# Patient Record
Sex: Female | Born: 1955 | Race: White | Hispanic: No | Smoking: Former smoker
Health system: Southern US, Community
[De-identification: ages and names within clinical notes are randomized; demographics above are authoritative.]

## PROBLEM LIST (undated history)

## (undated) DIAGNOSIS — E669 Obesity, unspecified: Secondary | ICD-10-CM

## (undated) DIAGNOSIS — F419 Anxiety disorder, unspecified: Secondary | ICD-10-CM

## (undated) DIAGNOSIS — M797 Fibromyalgia: Secondary | ICD-10-CM

## (undated) DIAGNOSIS — E785 Hyperlipidemia, unspecified: Secondary | ICD-10-CM

## (undated) DIAGNOSIS — F329 Major depressive disorder, single episode, unspecified: Secondary | ICD-10-CM

## (undated) DIAGNOSIS — K760 Fatty (change of) liver, not elsewhere classified: Secondary | ICD-10-CM

## (undated) DIAGNOSIS — L219 Seborrheic dermatitis, unspecified: Secondary | ICD-10-CM

## (undated) DIAGNOSIS — J45909 Unspecified asthma, uncomplicated: Secondary | ICD-10-CM

## (undated) DIAGNOSIS — E039 Hypothyroidism, unspecified: Secondary | ICD-10-CM

## (undated) DIAGNOSIS — I10 Essential (primary) hypertension: Secondary | ICD-10-CM

## (undated) DIAGNOSIS — T7840XA Allergy, unspecified, initial encounter: Secondary | ICD-10-CM

## (undated) DIAGNOSIS — D649 Anemia, unspecified: Secondary | ICD-10-CM

## (undated) DIAGNOSIS — E119 Type 2 diabetes mellitus without complications: Secondary | ICD-10-CM

## (undated) DIAGNOSIS — F32A Depression, unspecified: Secondary | ICD-10-CM

## (undated) DIAGNOSIS — G4733 Obstructive sleep apnea (adult) (pediatric): Secondary | ICD-10-CM

## (undated) DIAGNOSIS — K810 Acute cholecystitis: Secondary | ICD-10-CM

## (undated) DIAGNOSIS — K219 Gastro-esophageal reflux disease without esophagitis: Secondary | ICD-10-CM

## (undated) DIAGNOSIS — I639 Cerebral infarction, unspecified: Secondary | ICD-10-CM

## (undated) DIAGNOSIS — E1169 Type 2 diabetes mellitus with other specified complication: Secondary | ICD-10-CM

## (undated) HISTORY — DX: Type 2 diabetes mellitus without complications: E11.9

## (undated) HISTORY — DX: Allergy, unspecified, initial encounter: T78.40XA

## (undated) HISTORY — DX: Fatty (change of) liver, not elsewhere classified: K76.0

## (undated) HISTORY — DX: Seborrheic dermatitis, unspecified: L21.9

## (undated) HISTORY — PX: TUBAL LIGATION: SHX77

## (undated) HISTORY — PX: REDUCTION MAMMAPLASTY: SUR839

## (undated) HISTORY — DX: Hyperlipidemia, unspecified: E11.69

## (undated) HISTORY — DX: Anxiety disorder, unspecified: F41.9

## (undated) HISTORY — DX: Anemia, unspecified: D64.9

## (undated) HISTORY — DX: Hyperlipidemia, unspecified: E78.5

## (undated) HISTORY — DX: Acute cholecystitis: K81.0

## (undated) HISTORY — DX: Essential (primary) hypertension: I10

## (undated) HISTORY — DX: Unspecified asthma, uncomplicated: J45.909

## (undated) HISTORY — DX: Major depressive disorder, single episode, unspecified: F32.9

## (undated) HISTORY — DX: Gastro-esophageal reflux disease without esophagitis: K21.9

## (undated) HISTORY — DX: Depression, unspecified: F32.A

## (undated) HISTORY — DX: Obstructive sleep apnea (adult) (pediatric): G47.33

## (undated) HISTORY — DX: Hypothyroidism, unspecified: E03.9

## (undated) HISTORY — DX: Cerebral infarction, unspecified: I63.9

---

## 1999-05-06 ENCOUNTER — Other Ambulatory Visit: Admission: RE | Admit: 1999-05-06 | Discharge: 1999-05-06 | Payer: Self-pay | Admitting: Internal Medicine

## 1999-06-11 ENCOUNTER — Inpatient Hospital Stay (HOSPITAL_COMMUNITY): Admission: AD | Admit: 1999-06-11 | Discharge: 1999-06-17 | Payer: Self-pay | Admitting: Internal Medicine

## 1999-06-12 ENCOUNTER — Encounter: Payer: Self-pay | Admitting: Internal Medicine

## 1999-06-14 ENCOUNTER — Encounter: Payer: Self-pay | Admitting: Internal Medicine

## 2000-03-24 ENCOUNTER — Other Ambulatory Visit: Admission: RE | Admit: 2000-03-24 | Discharge: 2000-03-24 | Payer: Self-pay | Admitting: Obstetrics and Gynecology

## 2001-06-21 ENCOUNTER — Other Ambulatory Visit: Admission: RE | Admit: 2001-06-21 | Discharge: 2001-06-21 | Payer: Self-pay | Admitting: Internal Medicine

## 2001-11-03 ENCOUNTER — Encounter: Payer: Self-pay | Admitting: Emergency Medicine

## 2001-11-03 ENCOUNTER — Inpatient Hospital Stay (HOSPITAL_COMMUNITY): Admission: EM | Admit: 2001-11-03 | Discharge: 2001-11-09 | Payer: Self-pay | Admitting: Emergency Medicine

## 2001-11-03 ENCOUNTER — Encounter: Payer: Self-pay | Admitting: Neurology

## 2001-11-06 ENCOUNTER — Encounter: Payer: Self-pay | Admitting: Neurology

## 2001-11-09 ENCOUNTER — Encounter: Payer: Self-pay | Admitting: Neurology

## 2001-11-15 ENCOUNTER — Encounter: Admission: RE | Admit: 2001-11-15 | Discharge: 2001-12-11 | Payer: Self-pay | Admitting: Neurology

## 2002-10-09 ENCOUNTER — Other Ambulatory Visit: Admission: RE | Admit: 2002-10-09 | Discharge: 2002-10-09 | Payer: Self-pay | Admitting: Obstetrics and Gynecology

## 2002-11-01 ENCOUNTER — Ambulatory Visit (HOSPITAL_COMMUNITY): Admission: RE | Admit: 2002-11-01 | Discharge: 2002-11-01 | Payer: Self-pay | Admitting: Obstetrics and Gynecology

## 2002-11-23 ENCOUNTER — Ambulatory Visit (HOSPITAL_COMMUNITY): Admission: RE | Admit: 2002-11-23 | Discharge: 2002-11-23 | Payer: Self-pay | Admitting: Obstetrics and Gynecology

## 2003-10-04 ENCOUNTER — Inpatient Hospital Stay (HOSPITAL_COMMUNITY): Admission: EM | Admit: 2003-10-04 | Discharge: 2003-10-10 | Payer: Self-pay | Admitting: Psychiatry

## 2003-10-06 ENCOUNTER — Encounter: Payer: Self-pay | Admitting: *Deleted

## 2008-06-27 HISTORY — PX: BREAST REDUCTION SURGERY: SHX8

## 2008-07-10 ENCOUNTER — Encounter: Admission: RE | Admit: 2008-07-10 | Discharge: 2008-07-10 | Payer: Self-pay | Admitting: Internal Medicine

## 2008-07-15 ENCOUNTER — Encounter: Admission: RE | Admit: 2008-07-15 | Discharge: 2008-07-15 | Payer: Self-pay | Admitting: Internal Medicine

## 2009-06-10 ENCOUNTER — Encounter: Admission: RE | Admit: 2009-06-10 | Discharge: 2009-06-10 | Payer: Self-pay | Admitting: Internal Medicine

## 2009-08-15 ENCOUNTER — Other Ambulatory Visit: Admission: RE | Admit: 2009-08-15 | Discharge: 2009-08-15 | Payer: Self-pay | Admitting: Internal Medicine

## 2010-10-18 ENCOUNTER — Encounter: Payer: Self-pay | Admitting: Internal Medicine

## 2011-01-07 ENCOUNTER — Other Ambulatory Visit: Payer: Self-pay | Admitting: Internal Medicine

## 2011-01-07 ENCOUNTER — Other Ambulatory Visit (HOSPITAL_COMMUNITY)
Admission: RE | Admit: 2011-01-07 | Discharge: 2011-01-07 | Disposition: A | Discharge status: Home / Self Care | Payer: 59 | Source: Ambulatory Visit | Attending: Internal Medicine | Admitting: Internal Medicine

## 2011-01-07 DIAGNOSIS — Z1159 Encounter for screening for other viral diseases: Secondary | ICD-10-CM | POA: Insufficient documentation

## 2011-01-07 DIAGNOSIS — Z01419 Encounter for gynecological examination (general) (routine) without abnormal findings: Secondary | ICD-10-CM | POA: Insufficient documentation

## 2011-02-12 NOTE — Discharge Summary (Signed)
[place]. [facility]  Patient:    Jeanette Rose, Jeanette Rose Visit Number: [id number] MRN: [id number]          Service Type: MED Location: [phone] Attending Physician:  Meredith Leeds Dictated by:   Alyson Locket. Love, M.D. Admit Date:  11/03/2001 Disc. Date: 11/09/01   CC:         Charlyne Quale, M.D.   Discharge Summary  PATIENTS ADDRESS:  [address] [place]:  05/30/2056  This is the first [facility] admission for this 55 year old left-handed white divorced female from [place], [place] admitted from the emergency room for evaluation of dizziness, a light-headed sensation, double vision, and numbness of the tongue.  HISTORY OF PRESENT ILLNESS:  Mrs. Sibert has no prior history of high blood pressure, diabetes, or heart disease.  She has a history of some migraine headaches in her life.  She has been on birth control pills for three years and is a smoker, smoking one pack per day of cigarettes.  On the day of admission at about noon-time, she turned her head to her right quickly and noted the onset of a light-headed sensation and later a numb tongue.  Symptoms were associated with double vision.  Later, the double vision recurred when she was driving her car.  This was similar to the first episode but did not resolve and she came to the emergency room.  She reports that once previously many years, she had been light-headed.  She has no history of ______  sign, previous double vision, swallowing problems, slurred speech, blackout spells, or seizures.  PAST MEDICAL HISTORY:  Significant for birth control pills for three years, alcohol once per month, no allergies, and cigarette smoking of one pack per day, and possibly gastroesophageal reflux disease requiring intermittent Prevacid therapy.  Other details of her admission history have been dictated elsewhere.  PHYSICAL EXAMINATION:  VITAL  SIGNS:  Her examination at the time of admission revealed a blood pressure in the right and left arm of 160/81 and 150/80.  Her heart rate was 60 and regular.  GENERAL:  Unremarkable.  NEUROLOGIC:  Decreased medial movement of her right eye and upbeating vertical nystagmus.  She had slight hyperreflexia and downgoing plantar responses.  LABORATORY DATA:  A CT scan of the brain was unremarkable without the use of contrast enhancement.  MRI of the brain with and without contrast enhancement showed no acute finding, a few tiny foci of high flared T2 signal within the posterior end of the internal capsule on the right of uncertain significance. Intracranial MRA was negative.  Her chest x-ray showed no active cardiopulmonary process demonstrated.  Cervical MRI without contrast and MR angiography of the neck showed a small central disk herniation and spondylosis at C5-6, small left-sided disk herniation at C6-7.  MR angiography of the neck without contrast was negative without any evidence of a dissection present.  EKG showed sinus bradycardia with minimal voltage criteria for LVH and was considered a normal variant.  Two-D echocardiogram of the heart showed overall left ventricular systolic function and was normal.  Left ventricular ejection fraction was estimated between 55 and 65 percent.  There were no diagnostic abnormalities of left ventricular wall motion or valvular disease present.  Her serum for pseudocholin receptor antibody study was negative.  Serum VDRL nonreactive.  ANA study negative.  Her serum TSH was 1.288.  Cholesterol was high at 250, triglycerides 164, HDL 37,  and LDL of 180.  White blood cell count was 10,400, hemoglobin 14.1, hematocrit 41.8, platelet count 276K, 75 percent polys, 19 percent lymphs, 4 percent monocytes, 2 percent eosinophils, and 1 percent basophil.  PT 12.9, INR 1.0, PTT 24.  Sodium 136, potassium 3.6, chloride 106, CO2 content 23, glucose 89, BUN  14, creatinine 0.9, calcium 9.1.  Total protein 6.9.  Albumin 3.6, AST 19, ALT 15, ALP 72, total bilirubin 0.4.  Her pH 7.406, pCO2 34.5, pO2 23.  Urinalysis x 2 showed small bilirubin, large hemoglobin, small hemoglobin, positive nitrite, many bacteria, 0-2 white blood cells, 3-6 red blood cells. Her urine culture grew E. coli which was sensitive to Septra which she was begun on in the hospital.  CSF evaluation revealed no red blood cells, one white blood cell, slightly elevated opening pressure.  CSF VDRL nonreactive, CSF IgG 3.4, IgG index and oligoclonal banding pending.  Protein 46, glucose of 53.  Visual evoked responses and brain stem auditory evoked responses normal.  In the hospital, a Tensilon test unremarkable.  HOSPITAL COURSE:  The patient was admitted with question of vascular disease versus demyelinating disease as the cause of symptomatology.  Telemetry showed no significant abnormalities.  She was placed on aspirin and subcutaneous heparin therapy.  In the hospital, she complained of dysuria and was found to have evidence of urinary tract infection.  She also complained of some flank pain and an ultrasound of the kidneys is pending.  Doppler study of the carotids revealed no ICA stenosis bilaterally and antegrade vertebral flow. Pending studies at the time of discharge include protein C, protein S, antithrombin 3, and anticardiolipin antibodies.  She was taken off of her birth control pills in the hospital, begun on Ecotrin and subcutaneous heparin, and cigarettes were discontinued.  She was begun on a Nicoderm patch.  IMPRESSION: 1. 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 2. 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 3. Urinary tract infection (599.5). 4. Flank pain, rule out polynephritis. 5. Chronic obstructive pulmonary disease (496).  PLAN: 1. Discharge the patient on aspirin 1 q.d., patch alternating with either eye. 2. Discontinue cigarettes. 3.  Discontinue birth control pills.  4. Nicoderm patch 21 mg q.d. x 3 weeks, then 14 mg q.d. x 3 weeks, then    7 mg q.d. 5. Septra DS 1 p.o. b.i.d. x 10 days. 6. Prevacid 30 mg p.o. q.d. 7. Zocor 20 mg q.d. 8. She was to return to see me at 4 p.m. on 11/13/01. Dictated by:   Alyson Locket. Love, M.D. Attending Physician:  Meredith Leeds DD:  11/09/01 TD:  11/09/01 Job: [id number]

## 2011-02-12 NOTE — H&P (Signed)
[place]. [facility]  Patient:    Jeanette Rose, Jeanette Rose Visit Number: [id number] MRN: [id number]          Service Type: MED Location: [id number] Attending Physician:  Meredith Leeds Dictated by:   Alyson Locket. Love, M.D. Admit Date:  11/03/2001                           History and Physical  DATE OF BIRTH:  1956-09-16  [address] ADDRESS: [place], [place]  [place]  REASON FOR ADMISSION:  This is the first [facility] admission for this 55 year old left-handed white divorced female from [place], [place], admitted from the emergency room for evaluation of dizziness characterized by light-headed sensation, double vision and a numb tongue.  HISTORY OF PRESENT ILLNESS:  This patient has no known history of high blood pressure, diabetes or heart disease.  She has had a few migraine headaches in her life and a positive family history of headaches, in that her mother and sister have had migraine headaches.  She has been on birth control pills for three years and smokes approximately one pack per day of cigarettes.  She recalls an episode several years ago in which she stood up quickly and became somewhat dizzy.  This did last for a prolonged period of time.  Today, about noontime, she turned her head to the right quickly and noted the onset of a light-headed sensation later associated with a numb tongue and later associated with double vision.  She noticed while walking that she was staggering to her right.  Her symptoms cleared within 15 to 20 minutes and later while driving a car, she noted again the onset of double vision.  The double vision did not resolve and she was brought to the emergency room. There was no loss of consciousness, headache, chest pain or palpitations.  She did have associated nausea and vomiting.  She has not been on aspirin.  She denies any use of drugs.  PAST MEDICAL HISTORY:   Her past medical history is significant for birth control pills for three years, alcohol approximately once per month, no allergies and cigarettes one pack per day.  FAMILY HISTORY:  Her mother is 63, living and well; father died at 84 from myocardial infarction; she has a brother 77, another brother 32, a sister 65. She has children, a daughter 73 and a son 82, living and well.   PHYSICAL EXAMINATION:  GENERAL:  Physical examination revealed a well-developed white female who is in acute distress.  VITAL SIGNS:  Her blood pressure in right arm was 160/80, left arm 150/80. Heart rate was 60.  There were no bruits.  She was afebrile.  NEUROLOGIC:  Mental status:  She was alert and oriented x3.  Her cranial nerve examination revealed visual fields were full.  The disks were flat.  She had decreased medial movement of her right eye.  There was no ptosis.  There was no pupillary asymmetry.  The pupils were both reactive.  She had upbeating nystagmus.  Her corneals were present.  The pinprick was equal in the face. The tongue was midline.  The uvula was midline.  Gags were present.  Hearing was intact with air conduction greater than bone conduction.  Motor examination revealed 5/5 strength with increased deep tendon reflexes in the upper and lower extremities, including the superficial abdominal and the jaw jerks.  Sensory  examination was intact to pinprick, touch, joint position and vibration.  Deep tendon reflexes were as above.  HEENT:  General examination revealed the tympanic membranes to be clear.  LUNGS:  Clear.  HEART:  No murmurs.  ABDOMEN:  Bowel sounds were normal.  There was no enlargement of the liver, spleen or kidneys.  EXTREMITIES:  There was no clubbing or edema in extremities.  LABORATORY AND ACCESSORY DATA:  CT scan was negative.  Hemoglobin 15, hematocrit 44, sodium 137, potassium 3.6, chloride 108, CO2 content 23, BUN 13, glucose 96.  IMPRESSION: 1.  Double vision, code 368.2. 2. Vertigo, code 780.4, suspect brain stem stroke, rule out vertebral    dissection versus stroke secondary to birth control pills and cigarettes.  PLAN:  Patient will be admitted for aspirin therapy and further evaluation. Dictated by:   Alyson Locket. Love, M.D. Attending Physician:  Meredith Leeds DD:  11/03/01 TD:  11/04/01 Job: [phone]

## 2011-02-12 NOTE — Op Note (Signed)
NAME:  Jeanette Rose, Jeanette Rose                          ACCOUNT NO.:  [id number]   MEDICAL RECORD NO.:  [id number]                   PATIENT TYPE:  AMB   LOCATION:  SDC                                  FACILITY:  [facility]   PHYSICIAN:  Freda Munro, M.D.                 DATE OF BIRTH:  09-Jan-1956   DATE OF PROCEDURE:  11/23/2002  DATE OF DISCHARGE:                                 OPERATIVE REPORT   PREOPERATIVE DIAGNOSIS:  The patient desires permanent sterilization.   POSTOPERATIVE DIAGNOSIS:  The patient desires permanent sterilization with  extensive adhesions in the right lower quadrant and right adnexa.   PROCEDURES:  1. Laparoscopy with lysis of adhesions.  2. Bilateral tubal ligation.   SURGEON:  Freda Munro, M.D.   ANESTHESIA:  General endotracheal anesthesia.   ANTIBIOTICS:  Ancef 1 g.   ESTIMATED BLOOD LOSS:  Minimal.   COMPLICATIONS:  None.   SPECIMENS:  None.   INDICATIONS FOR PROCEDURE:  The patient is a 55 year old white female who  expressed her desire for permanent sterilization.  Prior to having the  procedure done, the patient expressed her understanding of the intended  permanence, possible failure rates and the risk and complications of  laparoscopy.   FINDINGS:  The patient had normal-appearing liver.  The gallbladder was not  seen.  The appendix was not visualized.  The patient had extensive adhesions  between the bowel and the right lower quadrant of the abdomen and also the  right adnexa to the pelvic sidewall involving the sigmoid colon.   DESCRIPTION OF PROCEDURE:  The patient was taken to the operating room where  general anesthetic was administered without complications.  She was then  placed in dorsal lithotomy position and prepped with Hibiclens.  Her bladder  was drained with a red rubber catheter.  A Hulka tenaculum was applied to  the anterior cervical lip.  She was then draped in the usual fashion for  this procedure.  Marcaine 0.25% was used  in the umbilicus.  A vertical skin  incision was then made.  A Veress needle was placed in the peritoneal cavity  and the patient was noted to have a high pressure, therefore, closed  laparoscopy was discontinued and open laparoscopy was performed.  The fascia  was grasped with two hemostats and incised and the parietal peritoneum  entered bluntly.  The O Vicryl sutures were placed on either side of the  incision and the Hasson cannula placed into the abdominal cavity.  Three  liters of carbon dioxide was insufflated.  The patient was then placed in  Trendelenburg.  The patient was noted to have these adhesions as mentioned  above.  The Hulka clip applicator was set up and clip applied to each  fallopian tube in the isthmic portion.  The tube appeared to be completely  with the clasp.  The clasp was applied perpendicular to the  tube and the  clasp appeared to be tightly closed.  Once this was accomplished, hook  scissors were used to take down the adhesions involving the bowel and right  lower quadrant.  Once this was accomplished, the ovary was grasped and the  sigmoid colon dissected from the ovary.  The sigmoid colon was then free.  Following this, the ovary was found to be adherent to the pelvic sidewall.  It was dissected from the pelvic sidewall.  Both ovaries appeared to be  normal at the completion of this procedure.  This concluded the procedure.  At this point, the pneumoperitoneum was released.  The fascia was closed  with interrupted O Vicryl suture.  The skin was closed with 4-0 Vicryl  suture.  The patient did have a 5 mm Jeanette Rose placed in the suprapubic region  prior to lysis of adhesions for greater traction on the adhesions.                                               Freda Munro, M.D.    MA/MEDQ  D:  11/23/2002  T:  11/23/2002  Job:  [id number]

## 2011-02-12 NOTE — Procedures (Signed)
[place]. [facility]  Patient:    Jeanette Rose, Jeanette Rose Visit Number: [id number] MRN: [id number]          Service Type: MED Location: [phone] Attending Physician:  Meredith Leeds Dictated by:   Alyson Locket. Love, M.D. Proc. Date: 11/03/01 Admit Date:  11/03/2001                             Procedure Report  ADDRESS:  [address], [place], [place]. DATE OF BIRTH:  06-08-1956.  DESCRIPTION OF PROCEDURE:  The patient was prepped and draped in the left lateral decubitus position.  The L4-5 interspace was entered without difficulty.  Opening pressure with patient tense was 220 mmH2O.  Clear, colorless CSF was obtained and sent for VDRL, cell count, differential, protein, glucose, IgG, and oligoclonal IgG.  The patient tolerated the procedure well. Dictated by:   Alyson Locket. Love, M.D. Attending Physician:  Meredith Leeds DD:  11/03/01 TD:  11/06/01 Job: [id number]

## 2011-02-12 NOTE — Discharge Summary (Signed)
NAME:  Jeanette Rose, Jeanette Rose                          ACCOUNT NO.:  [id number]   MEDICAL RECORD NO.:  [id number]                   PATIENT TYPE:  IPS   LOCATION:  0302                                 FACILITY:  [facility]   PHYSICIAN:  Rulon Eisenmenger, M.D.              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  She described mood changes since she stopped smoking in December.  She  reported promiscuous activity after divorcing husband three years ago.  She  was started on Serzone and Zyprexa by her primary care physician.  The  patient had increased mood lability and thoughts of overdosing or stabbing  herself.   MEDICATIONS:  1. Prevacid.  2. Allegra.  3. Serzone.  4. Zyprexa.   DRUG ALLERGIES:  No known drug allergies.   PHYSICAL EXAMINATION:  GENERAL:  Within normal limits.  NEUROLOGIC:  Nonfocal.   LABORATORY DATA:  Routine admission labs:  Within normal limits.   MENTAL STATUS EXAM:  Fully alert, guarded, initially tearful.  Speech:  Within normal limits.  Mood: Depressed, helpless, hopeless, anxious.  Thought process: Goal directed.  Positive suicidal ideation without plan at  this time and positive paranoid ideation.  Cognitive: Intact.  Judgment and  insight: Fair to poor.   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   AXIS II:  None.   AXIS III:  1. Rule out neuroleptic-induced extrapyramidal symptoms.  2. History of urinary hesitancy.   AXIS IV:  Moderate problems with primary support group and medical problems.   AXIS V:  26/65   HOSPITAL COURSE:  The patient was admitted, ordered routine p.r.n.  medications, underwent further monitoring, and was encouraged  to participate  in individual, group, and milieu therapy.  CT scan was ordered.  Effexor XR  37.5 mg started, Zyprexa held due to urinary hesitancy and Cogentin was  ordered x 1 dose for possible EPS but not to be continued, and Ativan was  ordered p.r.n.  The patient was initially having difficulty eating and  drinking, agitated, anxious, felt that Seroquel helped calm her down,  appeared to have possible akathisia.  The patient reported mixed symptoms  with no clear history of mania or current depressive symptoms.  Anxiety  symptoms had increased with Effexor and primary complaint was anxiety,  dread, sleeping to escape.  The patient was started on Paxil, Seroquel  optimized, and CT was ordered.  The patient reported gradually feeling  better as she stabilized on medications.  She was sedated at one point.  Medications were adjusted to minimize side effects and she reported  resolution of side  effects and resolution of suicidal ideation, feeling more  calm, mood more stable.   CONDITION ON DISCHARGE:  Condition on discharge was markedly improved.  Mood  was mostly euthymic.  Affect: Bright.  Thought processes: Goal directed.  Thought content: Negative for dangerous ideation or psychotic symptoms.  The  patient was discharged in improved condition after being given medication  education.   DISCHARGE MEDICATIONS:  1. Ambien 10 mg one q.h.s. p.r.n. insomnia.  2. Allegra 180 mg q.a.m.  3. Prevacid 30 mg q.a.m.  4. Seroquel 25 mg two to three q.h.s.  5. Ativan 0.5 mg one half q.a.m., one half at 3 p.m., one q.h.s.  6. Paxil CR 25 mg q.a.m.  7. Trileptal 300 mg q.h.s.   FOLLOW UP:  The patient was to follow up with mental health intensive  outpatient starting Monday on January 17 at 8:30 at [facility], also follow  up with [facility] and [facility], and Harriette Bouillon on January 24  at 1:45 p.m.   DISCHARGE DIAGNOSES:   AXIS I:  Major depressive disorder, recurrent, severe  with possible  psychotic features.   AXIS II:  None.   AXIS III:  1. Rule out neuroleptic-induced extrapyramidal symptoms.  2. History of urinary hesitancy.   AXIS IV:  Moderate problems with primary support group and medical problems.   AXIS V:  Global assessment of functioning on discharge was 23.                                               Rulon Eisenmenger, M.D.    JEM/MEDQ  D:  11/18/2003  T:  11/18/2003  Job:  [id number]

## 2011-07-09 ENCOUNTER — Other Ambulatory Visit: Payer: Self-pay | Admitting: Internal Medicine

## 2011-07-09 DIAGNOSIS — Z1231 Encounter for screening mammogram for malignant neoplasm of breast: Secondary | ICD-10-CM

## 2011-07-26 ENCOUNTER — Ambulatory Visit: Payer: 59

## 2011-10-12 ENCOUNTER — Other Ambulatory Visit: Payer: Self-pay | Admitting: Internal Medicine

## 2011-10-12 DIAGNOSIS — R42 Dizziness and giddiness: Secondary | ICD-10-CM

## 2011-10-13 ENCOUNTER — Ambulatory Visit
Admission: RE | Admit: 2011-10-13 | Discharge: 2011-10-13 | Disposition: A | Discharge status: Home / Self Care | Payer: 59 | Source: Ambulatory Visit | Attending: Internal Medicine | Admitting: Internal Medicine

## 2011-10-13 DIAGNOSIS — R42 Dizziness and giddiness: Secondary | ICD-10-CM

## 2013-04-12 ENCOUNTER — Other Ambulatory Visit (HOSPITAL_COMMUNITY)
Admission: RE | Admit: 2013-04-12 | Discharge: 2013-04-12 | Disposition: A | Discharge status: Home / Self Care | Payer: 59 | Source: Ambulatory Visit | Attending: Internal Medicine | Admitting: Internal Medicine

## 2013-04-12 ENCOUNTER — Other Ambulatory Visit: Payer: Self-pay | Admitting: Internal Medicine

## 2013-04-12 DIAGNOSIS — Z01419 Encounter for gynecological examination (general) (routine) without abnormal findings: Secondary | ICD-10-CM | POA: Insufficient documentation

## 2013-09-27 DIAGNOSIS — E039 Hypothyroidism, unspecified: Secondary | ICD-10-CM

## 2013-09-27 HISTORY — DX: Hypothyroidism, unspecified: E03.9

## 2013-10-28 ENCOUNTER — Ambulatory Visit (INDEPENDENT_AMBULATORY_CARE_PROVIDER_SITE_OTHER): Payer: 59 | Admitting: Family Medicine

## 2013-10-28 ENCOUNTER — Ambulatory Visit: Payer: 59

## 2013-10-28 VITALS — BP 118/70 | HR 83 | Temp 99.4°F | Resp 18 | Ht 64.0 in | Wt 187.0 lb

## 2013-10-28 DIAGNOSIS — R05 Cough: Secondary | ICD-10-CM

## 2013-10-28 DIAGNOSIS — R059 Cough, unspecified: Secondary | ICD-10-CM

## 2013-10-28 DIAGNOSIS — R509 Fever, unspecified: Secondary | ICD-10-CM

## 2013-10-28 LAB — POCT CBC
Granulocyte percent: 60.1 %G (ref 37–80)
HCT, POC: 42.3 % (ref 37.7–47.9)
Hemoglobin: 12.9 g/dL (ref 12.2–16.2)
Lymph, poc: 1.8 (ref 0.6–3.4)
MCH, POC: 29.9 pg (ref 27–31.2)
MCHC: 30.5 g/dL — AB (ref 31.8–35.4)
MCV: 97.9 fL — AB (ref 80–97)
MID (cbc): 0.5 (ref 0–0.9)
MPV: 9 fL (ref 0–99.8)
POC Granulocyte: 3.4 (ref 2–6.9)
POC LYMPH PERCENT: 31.8 %L (ref 10–50)
POC MID %: 8.1 %M (ref 0–12)
Platelet Count, POC: 204 10*3/uL (ref 142–424)
RBC: 4.32 M/uL (ref 4.04–5.48)
RDW, POC: 14.4 %
WBC: 5.7 10*3/uL (ref 4.6–10.2)

## 2013-10-28 LAB — POCT INFLUENZA A/B
Influenza A, POC: NEGATIVE
Influenza B, POC: NEGATIVE

## 2013-10-28 MED ORDER — HYDROCODONE-HOMATROPINE 5-1.5 MG/5ML PO SYRP: 5.0000 mL | ORAL_SOLUTION | Freq: Three times a day (TID) | ORAL | Status: DC | PRN

## 2013-10-28 MED ORDER — AZITHROMYCIN 250 MG PO TABS: ORAL_TABLET | ORAL | Status: DC

## 2013-10-28 NOTE — Progress Notes (Signed)
Subjective:    Patient ID: Jeanette Rose, female    DOB: 1956/02/08, 58 y.o.   MRN: [id number]  HPI This chart was scribed for [name] by Celesta Gentile, Scribe. This patient was seen in room 13 and the patient's care was started at 12:42 PM.  HPI Comments: Jeanette Rose is a 58 y.o. female who presents to the [facility] complaining of intermittent SOB with associated persistent cough that started about three days ago.  Pt states that the SOB is worsened with movement.  She has associated eye discomfort and subjective fever.  Temperature is currently 99.16F.  Pt also complains of moderate sharp back pain and HA.  She reports that she received a flu shot this year.  Pt denies h/o of asthma.  Pt does not smoke.  Pt works at [employer] here in [place].   History reviewed. No pertinent past surgical history.  Family History  Problem Relation Age of Onset   Heart disease Father     History   Social History   Marital Status: Divorced    Spouse Name: N/A    Number of Children: N/A   Years of Education: N/A   Occupational History   Not on file.   Social History Main Topics   Smoking status: Never Smoker    Smokeless tobacco: Not on file   Alcohol Use: Not on file   Drug Use: Not on file   Sexual Activity: Not on file   Other Topics Concern   Not on file   Social History Narrative   No narrative on file    No Known Allergies  There are no active problems to display for this patient.   Review of Systems  Constitutional: Positive for fever. Negative for chills.  HENT: Negative for congestion, ear pain, rhinorrhea and sore throat.   Eyes: Positive for pain.  Respiratory: Positive for cough and shortness of breath.   Cardiovascular: Negative for chest pain.  Gastrointestinal: Negative for nausea, vomiting, abdominal pain and diarrhea.  Musculoskeletal: Positive for back pain.  Skin: Negative for color change and rash.    Neurological: Positive for headaches. Negative for syncope.      Objective:   Physical Exam Patient's no acute distress but appears somewhat ill and tired HEENT: Mild erythema of the throat otherwise negative Neck: Supple, no adenopathy, Chest: Rhonchi bilaterally Heart: Regular, no murmur Skin: No obvious lesions or rashes [facility] reading (PRIMARY) by  Dr. Joseph Art:  CXR: no infiltrates Some increased breath sounds after nebulizer Results for orders placed in visit on 10/28/13  POCT CBC      Result Value Range   WBC 5.7  4.6 - 10.2 K/uL   Lymph, poc 1.8  0.6 - 3.4   POC LYMPH PERCENT 31.8  10 - 50 %L   MID (cbc) 0.5  0 - 0.9   POC MID % 8.1  0 - 12 %M   POC Granulocyte 3.4  2 - 6.9   Granulocyte percent 60.1  37 - 80 %G   RBC 4.32  4.04 - 5.48 M/uL   Hemoglobin 12.9  12.2 - 16.2 g/dL   HCT, POC 42.3  37.7 - 47.9 %   MCV 97.9 (*) 80 - 97 fL   MCH, POC 29.9  27 - 31.2 pg   MCHC 30.5 (*) 31.8 - 35.4 g/dL   RDW, POC 14.4     Platelet Count, POC 204  142 - 424 K/uL  MPV 9.0  0 - 99.8 fL  POCT INFLUENZA A/B      Result Value Range   Influenza A, POC Negative     Influenza B, POC Negative     .   Triage Vitals: BP 118/70   Pulse 83   Temp(Src) 99.4 F (37.4 C)   Resp 18   Ht 5' 4"  (1.626 m)   Wt 187 lb (84.823 kg)   BMI 32.08 kg/m2   SpO2 96%     Assessment & Plan:    Cough - Plan: POCT CBC, POCT Influenza A/B, DG Chest 2 View, azithromycin (ZITHROMAX Z-PAK) 250 MG tablet, HYDROcodone-homatropine (HYCODAN) 5-1.5 MG/5ML syrup  Fever, unspecified - Plan: POCT CBC, POCT Influenza A/B, DG Chest 2 View, azithromycin (ZITHROMAX Z-PAK) 250 MG tablet, HYDROcodone-homatropine (HYCODAN) 5-1.5 MG/5ML syrup  Signed, Robyn Haber, MD

## 2013-10-28 NOTE — Patient Instructions (Signed)

## 2013-10-30 ENCOUNTER — Telehealth: Payer: Self-pay

## 2013-10-30 NOTE — Telephone Encounter (Signed)
Okay to extend note until Monday the 9th of feb 2015

## 2013-10-30 NOTE — Telephone Encounter (Signed)
Please advise as it doesn't state her out of work status on the encounter. Pt states her back still hurts, having sinus pressure and pain along with weakness. No fever this morning. Would like her work note extended through the rest of the week .

## 2013-10-30 NOTE — Telephone Encounter (Signed)
Patient called back to see if her work note was ready for pick up

## 2013-10-30 NOTE — Telephone Encounter (Signed)
Patient states that she was seen for bronchitis and was given a note to return to work on 10/31/2012 however she does not think she is well enough to return by then. Can she get an extension for the rest of the week?  [phone]

## 2013-10-31 ENCOUNTER — Encounter: Payer: Self-pay | Admitting: *Deleted

## 2013-10-31 NOTE — Telephone Encounter (Signed)
Work note printed, put upfront for pt to print out. Pt notified.

## 2014-04-01 ENCOUNTER — Other Ambulatory Visit (HOSPITAL_COMMUNITY): Payer: Self-pay | Admitting: Internal Medicine

## 2014-04-01 ENCOUNTER — Ambulatory Visit (HOSPITAL_COMMUNITY)
Admission: RE | Admit: 2014-04-01 | Discharge: 2014-04-01 | Disposition: A | Discharge status: Home / Self Care | Payer: 59 | Source: Ambulatory Visit | Attending: Internal Medicine | Admitting: Internal Medicine

## 2014-04-01 ENCOUNTER — Other Ambulatory Visit: Payer: Self-pay | Admitting: Internal Medicine

## 2014-04-01 DIAGNOSIS — R160 Hepatomegaly, not elsewhere classified: Secondary | ICD-10-CM

## 2014-04-01 DIAGNOSIS — R10811 Right upper quadrant abdominal tenderness: Secondary | ICD-10-CM

## 2014-04-02 ENCOUNTER — Encounter (HOSPITAL_COMMUNITY): Payer: Self-pay | Admitting: Emergency Medicine

## 2014-04-02 ENCOUNTER — Encounter (INDEPENDENT_AMBULATORY_CARE_PROVIDER_SITE_OTHER): Payer: Self-pay | Admitting: General Surgery

## 2014-04-02 ENCOUNTER — Emergency Department (HOSPITAL_COMMUNITY): Payer: 59

## 2014-04-02 ENCOUNTER — Inpatient Hospital Stay (HOSPITAL_COMMUNITY)
Admission: EM | Admit: 2014-04-02 | Discharge: 2014-04-07 | DRG: 419 | Disposition: A | Discharge status: Home / Self Care | Payer: 59 | Attending: Surgery | Admitting: Surgery

## 2014-04-02 DIAGNOSIS — K7581 Nonalcoholic steatohepatitis (NASH): Secondary | ICD-10-CM | POA: Diagnosis present

## 2014-04-02 DIAGNOSIS — E119 Type 2 diabetes mellitus without complications: Secondary | ICD-10-CM | POA: Diagnosis present

## 2014-04-02 DIAGNOSIS — N9989 Other postprocedural complications and disorders of genitourinary system: Secondary | ICD-10-CM | POA: Diagnosis not present

## 2014-04-02 DIAGNOSIS — R112 Nausea with vomiting, unspecified: Secondary | ICD-10-CM

## 2014-04-02 DIAGNOSIS — K810 Acute cholecystitis: Secondary | ICD-10-CM

## 2014-04-02 DIAGNOSIS — IMO0002 Reserved for concepts with insufficient information to code with codable children: Secondary | ICD-10-CM | POA: Diagnosis not present

## 2014-04-02 DIAGNOSIS — F411 Generalized anxiety disorder: Secondary | ICD-10-CM | POA: Diagnosis present

## 2014-04-02 DIAGNOSIS — R160 Hepatomegaly, not elsewhere classified: Secondary | ICD-10-CM | POA: Diagnosis present

## 2014-04-02 DIAGNOSIS — K449 Diaphragmatic hernia without obstruction or gangrene: Secondary | ICD-10-CM | POA: Diagnosis present

## 2014-04-02 DIAGNOSIS — F329 Major depressive disorder, single episode, unspecified: Secondary | ICD-10-CM | POA: Diagnosis present

## 2014-04-02 DIAGNOSIS — Y839 Surgical procedure, unspecified as the cause of abnormal reaction of the patient, or of later complication, without mention of misadventure at the time of the procedure: Secondary | ICD-10-CM | POA: Diagnosis not present

## 2014-04-02 DIAGNOSIS — Z794 Long term (current) use of insulin: Secondary | ICD-10-CM

## 2014-04-02 DIAGNOSIS — R339 Retention of urine, unspecified: Secondary | ICD-10-CM | POA: Diagnosis not present

## 2014-04-02 DIAGNOSIS — I10 Essential (primary) hypertension: Secondary | ICD-10-CM | POA: Diagnosis present

## 2014-04-02 DIAGNOSIS — R1011 Right upper quadrant pain: Secondary | ICD-10-CM

## 2014-04-02 DIAGNOSIS — Z8249 Family history of ischemic heart disease and other diseases of the circulatory system: Secondary | ICD-10-CM

## 2014-04-02 DIAGNOSIS — F3289 Other specified depressive episodes: Secondary | ICD-10-CM | POA: Diagnosis present

## 2014-04-02 DIAGNOSIS — F419 Anxiety disorder, unspecified: Secondary | ICD-10-CM | POA: Diagnosis present

## 2014-04-02 DIAGNOSIS — E1142 Type 2 diabetes mellitus with diabetic polyneuropathy: Secondary | ICD-10-CM | POA: Diagnosis present

## 2014-04-02 DIAGNOSIS — K219 Gastro-esophageal reflux disease without esophagitis: Secondary | ICD-10-CM | POA: Diagnosis present

## 2014-04-02 DIAGNOSIS — Z6832 Body mass index (BMI) 32.0-32.9, adult: Secondary | ICD-10-CM

## 2014-04-02 DIAGNOSIS — K7689 Other specified diseases of liver: Secondary | ICD-10-CM | POA: Diagnosis present

## 2014-04-02 DIAGNOSIS — E669 Obesity, unspecified: Secondary | ICD-10-CM | POA: Diagnosis present

## 2014-04-02 DIAGNOSIS — K660 Peritoneal adhesions (postprocedural) (postinfection): Secondary | ICD-10-CM | POA: Diagnosis present

## 2014-04-02 DIAGNOSIS — K819 Cholecystitis, unspecified: Secondary | ICD-10-CM

## 2014-04-02 HISTORY — DX: Obesity, unspecified: E66.9

## 2014-04-02 HISTORY — DX: Acute cholecystitis: K81.0

## 2014-04-02 LAB — CBC WITH DIFFERENTIAL/PLATELET
Basophils Absolute: 0 10*3/uL (ref 0.0–0.1)
Basophils Relative: 0 % (ref 0–1)
Eosinophils Absolute: 0.1 10*3/uL (ref 0.0–0.7)
Eosinophils Relative: 0 % (ref 0–5)
HCT: 40.1 % (ref 36.0–46.0)
HEMOGLOBIN: 14 g/dL (ref 12.0–15.0)
LYMPHS ABS: 1.4 10*3/uL (ref 0.7–4.0)
Lymphocytes Relative: 8 % — ABNORMAL LOW (ref 12–46)
MCH: 31.7 pg (ref 26.0–34.0)
MCHC: 34.9 g/dL (ref 30.0–36.0)
MCV: 90.9 fL (ref 78.0–100.0)
MONOS PCT: 7 % (ref 3–12)
Monocytes Absolute: 1.2 10*3/uL — ABNORMAL HIGH (ref 0.1–1.0)
NEUTROS ABS: 15.5 10*3/uL — AB (ref 1.7–7.7)
Neutrophils Relative %: 85 % — ABNORMAL HIGH (ref 43–77)
PLATELETS: 224 10*3/uL (ref 150–400)
RBC: 4.41 MIL/uL (ref 3.87–5.11)
RDW: 13.1 % (ref 11.5–15.5)
WBC: 18.3 10*3/uL — ABNORMAL HIGH (ref 4.0–10.5)

## 2014-04-02 LAB — COMPREHENSIVE METABOLIC PANEL
ALK PHOS: 90 U/L (ref 39–117)
ALT: 48 U/L — AB (ref 0–35)
AST: 55 U/L — ABNORMAL HIGH (ref 0–37)
Albumin: 3.7 g/dL (ref 3.5–5.2)
Anion gap: 16 — ABNORMAL HIGH (ref 5–15)
BILIRUBIN TOTAL: 1.7 mg/dL — AB (ref 0.3–1.2)
BUN: 7 mg/dL (ref 6–23)
CO2: 25 meq/L (ref 19–32)
Calcium: 9.9 mg/dL (ref 8.4–10.5)
Chloride: 92 mEq/L — ABNORMAL LOW (ref 96–112)
Creatinine, Ser: 0.67 mg/dL (ref 0.50–1.10)
GLUCOSE: 252 mg/dL — AB (ref 70–99)
POTASSIUM: 4.4 meq/L (ref 3.7–5.3)
Sodium: 133 mEq/L — ABNORMAL LOW (ref 137–147)
TOTAL PROTEIN: 8.1 g/dL (ref 6.0–8.3)

## 2014-04-02 LAB — I-STAT TROPONIN, ED: TROPONIN I, POC: 0 ng/mL (ref 0.00–0.08)

## 2014-04-02 LAB — URINALYSIS, ROUTINE W REFLEX MICROSCOPIC
Bilirubin Urine: NEGATIVE
Glucose, UA: >1000 mg/dL — AB
HGB URINE DIPSTICK: NEGATIVE
KETONES UR: NEGATIVE mg/dL
Leukocytes, UA: NEGATIVE
Nitrite: NEGATIVE
Protein, ur: NEGATIVE mg/dL
SPECIFIC GRAVITY, URINE: 1.014 (ref 1.005–1.030)
Urobilinogen, UA: 2 mg/dL — ABNORMAL HIGH (ref 0.0–1.0)
pH: 6.5 (ref 5.0–8.0)

## 2014-04-02 LAB — PROTIME-INR
INR: 1.06 (ref 0.00–1.49)
Prothrombin Time: 13.8 seconds (ref 11.6–15.2)

## 2014-04-02 LAB — URINE MICROSCOPIC-ADD ON

## 2014-04-02 LAB — GLUCOSE, CAPILLARY
GLUCOSE-CAPILLARY: 167 mg/dL — AB (ref 70–99)
GLUCOSE-CAPILLARY: 185 mg/dL — AB (ref 70–99)
Glucose-Capillary: 176 mg/dL — ABNORMAL HIGH (ref 70–99)

## 2014-04-02 LAB — LIPASE, BLOOD: Lipase: 17 U/L (ref 11–59)

## 2014-04-02 LAB — APTT: APTT: 31 s (ref 24–37)

## 2014-04-02 MED ORDER — MORPHINE SULFATE 4 MG/ML IJ SOLN
4.0000 mg | Freq: Once | INTRAMUSCULAR | Status: AC
  Administered 2014-04-02: 4 mg via INTRAVENOUS
  Filled 2014-04-02: qty 1

## 2014-04-02 MED ORDER — IOHEXOL 300 MG/ML  SOLN
100.0000 mL | Freq: Once | INTRAMUSCULAR | Status: AC | PRN
  Administered 2014-04-02: 100 mL via INTRAVENOUS

## 2014-04-02 MED ORDER — DIPHENHYDRAMINE HCL 50 MG/ML IJ SOLN
12.5000 mg | Freq: Four times a day (QID) | INTRAMUSCULAR | Status: DC | PRN
Start: 2014-04-02 — End: 2014-04-07

## 2014-04-02 MED ORDER — LORAZEPAM 1 MG PO TABS
1.0000 mg | ORAL_TABLET | Freq: Three times a day (TID) | ORAL | Status: DC | PRN
  Administered 2014-04-05 (×2): 1 mg via ORAL
  Filled 2014-04-02 (×2): qty 1

## 2014-04-02 MED ORDER — PROMETHAZINE HCL 25 MG/ML IJ SOLN
6.2500 mg | Freq: Four times a day (QID) | INTRAMUSCULAR | Status: DC | PRN
  Filled 2014-04-02: qty 1

## 2014-04-02 MED ORDER — DULOXETINE HCL 60 MG PO CPEP
120.0000 mg | ORAL_CAPSULE | Freq: Every day | ORAL | Status: DC
  Administered 2014-04-02 – 2014-04-07 (×6): 120 mg via ORAL
  Filled 2014-04-02 (×6): qty 2

## 2014-04-02 MED ORDER — PIPERACILLIN-TAZOBACTAM 3.375 G IVPB
3.3750 g | Freq: Three times a day (TID) | INTRAVENOUS | Status: DC
  Filled 2014-04-02: qty 50

## 2014-04-02 MED ORDER — MENTHOL 3 MG MT LOZG
1.0000 | LOZENGE | OROMUCOSAL | Status: DC | PRN
  Filled 2014-04-02: qty 9

## 2014-04-02 MED ORDER — KETOROLAC TROMETHAMINE 30 MG/ML IJ SOLN
30.0000 mg | Freq: Four times a day (QID) | INTRAMUSCULAR | Status: DC | PRN
  Administered 2014-04-04: 30 mg via INTRAVENOUS
  Filled 2014-04-02 (×2): qty 2
  Filled 2014-04-02: qty 1

## 2014-04-02 MED ORDER — OXYCODONE HCL 5 MG PO TABS
5.0000 mg | ORAL_TABLET | ORAL | Status: DC | PRN
  Administered 2014-04-03: 5 mg via ORAL
  Administered 2014-04-04: 10 mg via ORAL
  Administered 2014-04-04 – 2014-04-05 (×3): 5 mg via ORAL
  Administered 2014-04-05 – 2014-04-06 (×2): 10 mg via ORAL
  Administered 2014-04-06: 5 mg via ORAL
  Administered 2014-04-06 – 2014-04-07 (×3): 10 mg via ORAL
  Filled 2014-04-02: qty 2
  Filled 2014-04-02: qty 1
  Filled 2014-04-02 (×2): qty 2
  Filled 2014-04-02 (×3): qty 1
  Filled 2014-04-02: qty 2
  Filled 2014-04-02 (×2): qty 1
  Filled 2014-04-02 (×2): qty 2

## 2014-04-02 MED ORDER — ONDANSETRON HCL 4 MG/2ML IJ SOLN
4.0000 mg | Freq: Once | INTRAMUSCULAR | Status: AC
  Administered 2014-04-02: 4 mg via INTRAVENOUS
  Filled 2014-04-02: qty 2

## 2014-04-02 MED ORDER — SODIUM CHLORIDE 0.9 % IV BOLUS (SEPSIS)
1000.0000 mL | Freq: Once | INTRAVENOUS | Status: AC
  Administered 2014-04-02: 1000 mL via INTRAVENOUS

## 2014-04-02 MED ORDER — LACTATED RINGERS IV BOLUS (SEPSIS): 1000.0000 mL | Freq: Three times a day (TID) | INTRAVENOUS | Status: DC | PRN

## 2014-04-02 MED ORDER — POTASSIUM CHLORIDE IN NACL 20-0.9 MEQ/L-% IV SOLN
INTRAVENOUS | Status: DC
  Administered 2014-04-02 – 2014-04-03 (×2): via INTRAVENOUS
  Filled 2014-04-02 (×3): qty 1000

## 2014-04-02 MED ORDER — PHENOL 1.4 % MT LIQD
2.0000 | OROMUCOSAL | Status: DC | PRN
Start: 2014-04-02 — End: 2014-04-07

## 2014-04-02 MED ORDER — HEPARIN SODIUM (PORCINE) 5000 UNIT/ML IJ SOLN
5000.0000 [IU] | Freq: Three times a day (TID) | INTRAMUSCULAR | Status: AC
  Administered 2014-04-03: 5000 [IU] via SUBCUTANEOUS
  Filled 2014-04-02: qty 1

## 2014-04-02 MED ORDER — ACETAMINOPHEN 650 MG RE SUPP: 650.0000 mg | Freq: Four times a day (QID) | RECTAL | Status: DC | PRN

## 2014-04-02 MED ORDER — DIPHENHYDRAMINE HCL 12.5 MG/5ML PO ELIX: 12.5000 mg | ORAL_SOLUTION | Freq: Four times a day (QID) | ORAL | Status: DC | PRN

## 2014-04-02 MED ORDER — METOPROLOL TARTRATE 1 MG/ML IV SOLN
5.0000 mg | Freq: Four times a day (QID) | INTRAVENOUS | Status: DC | PRN
  Filled 2014-04-02: qty 5

## 2014-04-02 MED ORDER — ONDANSETRON 4 MG PO TBDP
4.0000 mg | ORAL_TABLET | Freq: Four times a day (QID) | ORAL | Status: DC | PRN
  Filled 2014-04-02: qty 2

## 2014-04-02 MED ORDER — HYDROCHLOROTHIAZIDE 25 MG PO TABS
25.0000 mg | ORAL_TABLET | Freq: Every day | ORAL | Status: DC
  Administered 2014-04-02 – 2014-04-07 (×6): 25 mg via ORAL
  Filled 2014-04-02 (×6): qty 1

## 2014-04-02 MED ORDER — ONDANSETRON HCL 4 MG/2ML IJ SOLN: 4.0000 mg | Freq: Four times a day (QID) | INTRAMUSCULAR | Status: DC | PRN

## 2014-04-02 MED ORDER — SODIUM CHLORIDE 0.9 % IV SOLN
3.0000 g | Freq: Four times a day (QID) | INTRAVENOUS | Status: AC
  Administered 2014-04-02 – 2014-04-06 (×15): 3 g via INTRAVENOUS
  Filled 2014-04-02 (×16): qty 3

## 2014-04-02 MED ORDER — ACETAMINOPHEN 325 MG PO TABS
650.0000 mg | ORAL_TABLET | Freq: Four times a day (QID) | ORAL | Status: DC | PRN
  Administered 2014-04-02 – 2014-04-06 (×2): 650 mg via ORAL
  Filled 2014-04-02 (×2): qty 2

## 2014-04-02 MED ORDER — MORPHINE SULFATE 2 MG/ML IJ SOLN
2.0000 mg | INTRAMUSCULAR | Status: AC | PRN
  Administered 2014-04-02 – 2014-04-03 (×2): 2 mg via INTRAVENOUS
  Filled 2014-04-02 (×2): qty 1

## 2014-04-02 MED ORDER — MAGIC MOUTHWASH
15.0000 mL | Freq: Four times a day (QID) | ORAL | Status: DC | PRN
  Filled 2014-04-02: qty 15

## 2014-04-02 MED ORDER — IOHEXOL 300 MG/ML  SOLN
50.0000 mL | Freq: Once | INTRAMUSCULAR | Status: AC | PRN
  Administered 2014-04-02: 50 mL via ORAL

## 2014-04-02 MED ORDER — FLUTICASONE PROPIONATE 50 MCG/ACT NA SUSP
2.0000 | Freq: Every day | NASAL | Status: DC | PRN
Start: 2014-04-02 — End: 2014-04-07
  Filled 2014-04-02: qty 16

## 2014-04-02 MED ORDER — INSULIN ASPART 100 UNIT/ML ~~LOC~~ SOLN
0.0000 [IU] | Freq: Three times a day (TID) | SUBCUTANEOUS | Status: DC
  Administered 2014-04-02 – 2014-04-03 (×2): 4 [IU] via SUBCUTANEOUS
  Administered 2014-04-03: 3 [IU] via SUBCUTANEOUS
  Administered 2014-04-04 (×2): 11 [IU] via SUBCUTANEOUS
  Administered 2014-04-06: 3 [IU] via SUBCUTANEOUS

## 2014-04-02 MED ORDER — ALUM & MAG HYDROXIDE-SIMETH 200-200-20 MG/5ML PO SUSP
30.0000 mL | Freq: Four times a day (QID) | ORAL | Status: DC | PRN
Start: 2014-04-02 — End: 2014-04-07
  Administered 2014-04-05: 30 mL via ORAL
  Filled 2014-04-02: qty 30

## 2014-04-02 MED ORDER — ZOLPIDEM TARTRATE 5 MG PO TABS: 5.0000 mg | ORAL_TABLET | Freq: Every evening | ORAL | Status: DC | PRN

## 2014-04-02 MED ORDER — PANTOPRAZOLE SODIUM 40 MG PO TBEC
40.0000 mg | DELAYED_RELEASE_TABLET | Freq: Every day | ORAL | Status: DC
  Administered 2014-04-03 – 2014-04-07 (×5): 40 mg via ORAL
  Filled 2014-04-02 (×5): qty 1

## 2014-04-02 MED ORDER — LIP MEDEX EX OINT
1.0000 "application " | TOPICAL_OINTMENT | Freq: Two times a day (BID) | CUTANEOUS | Status: DC
  Administered 2014-04-03 – 2014-04-06 (×8): 1 via TOPICAL
  Filled 2014-04-02 (×2): qty 7

## 2014-04-02 MED ORDER — PANTOPRAZOLE SODIUM 40 MG IV SOLR: 40.0000 mg | Freq: Every day | INTRAVENOUS | Status: DC

## 2014-04-02 NOTE — ED Notes (Signed)
Pt states that she was seen here yesterday for cyst on her liver and was told to come back if she is still having pain.  Pt has n/v but denies diarrhea.  Pt states that she was supposed to be getting an MRI.

## 2014-04-02 NOTE — H&P (Signed)
Jeanette Rose is an 59 y.o. female.   PCP: Kandice Hams, MD  Chief Complaint: Abdominal pain, nausea and vomiting. HPI: Pt reports she was at her baseline state till 03/25/14.  She had a cheese potatoes from [facility].  After that she developed pain RUQ going to her back on the right side, symptoms started about 4 PM.  She developed nausea and vomiting, multiple episodes;  this lasted all night.  She had pain when she went to bed but felt better after getting up.  She did not go to work until 6/30, but felt fine till 03/30/14.  She had another Cheese potatoes from [facility] and her symptoms returned.  She had pain, nausea, multiple episodes of vomiting both 7/4 and on thru 03/31/14.  Nothing made her feel better, there was no position she could take that would make her feel better.  She had ongoing nausea and vomiting.  She saw her PCP on Monday 04/01/14 and was sent for an abdominal US.  This showed: No gallstones or wall thickening visualized, measuring 2 mm. No sonographic Murphy sign noted. Gallbladder is distended. Measuring up to 6 cm in diameter. 4 x 2 x 2.8 cm hypoechoic mass right lobe of the  liver. Liver parenchyma otherwise unremarkable.  Pt has continued to have pain, ongoing nausea and vomiting.  She was unable to eat and was sent home after study.  She continues to have pain, nausea and vomiting so she presented to the ED.  Work up shows mildly elevated LFT's, Bilirubin 1.7, AST, and ALT just minimally elevated.  WBC is 18.3 with left shift.  CT scan shows an inflammatory process in the RUQ surrounding the duodenum and gallbladder. The duodenum is slightly thickened walls and primary duodenal abnormality with secondary gallbladder distension is a consideration although primary gallbladder abnormality not entirely excluded. There is no evidence of extravasation of ingested contrast or free intraperitoneal air. She look and feels miserable, she still had pain, no nausea or vomiting right now.  We are ask  to see.     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 No pertinent past surgical history. None  Family History  Problem Relation Age of Onset  . Heart disease Father    Social History:  reports that she has never smoked. She does not have any smokeless tobacco history on file. She reports that she does not drink alcohol. Her drug history is not on file.  Tobacco 4 years, none for 13 ETOH:  Social none for 10 years Drugs:  None 1 son deceased, divorced, 1 daughter in [place], [place]. Works at the [employer].  Allergies: No Known Allergies  Prior to Admission medications   Medication Sig Start Date End Date Taking? Authorizing Provider  acetaminophen (TYLENOL) 500 MG tablet Take 1,000 mg by mouth every 6 (six) hours as needed for moderate pain.   Yes Historical Provider, MD  cetirizine (ZYRTEC) 10 MG tablet Take 10 mg by mouth daily.   Yes Historical Provider, MD  Cyanocobalamin (VITAMIN B-12 CR PO) Take 1 tablet by mouth daily.   Yes Historical Provider, MD  DULoxetine (CYMBALTA) 60 MG capsule Take 120 mg by mouth daily.  Yes Historical Provider, MD  fluticasone (FLONASE) 50 MCG/ACT nasal spray Place 2 sprays into the nose daily as needed for allergies.    Yes Historical Provider, MD  glyBURIDE-metformin (GLUCOVANCE) 1.25-250 MG per tablet Take 1 tablet by mouth daily with breakfast.   Yes Historical Provider, MD  hydrochlorothiazide (HYDRODIURIL) 25 MG tablet Take 25 mg by mouth daily.   Yes Historical Provider, MD  hydrochlorothiazide (HYDRODIURIL) 25 MG tablet Take 25 mg by mouth daily.   Yes Historical Provider, MD  insulin glargine (LANTUS) 100 UNIT/ML injection Inject 21 Units into the skin daily.    Yes Historical Provider, MD  LORazepam (ATIVAN) 1 MG  tablet Take 1 mg by mouth every 8 (eight) hours as needed for anxiety.    Yes Historical Provider, MD  omeprazole (PRILOSEC) 20 MG capsule Take 20 mg by mouth daily.   Yes Historical Provider, MD  simvastatin (ZOCOR) 10 MG tablet Take 10 mg by mouth daily.   Yes Historical Provider, MD  valsartan (DIOVAN) 80 MG tablet Take 80 mg by mouth daily.   Yes Historical Provider, MD  VITAMIN D, CHOLECALCIFEROL, PO Take 1 capsule by mouth daily.   Yes Historical Provider, MD    Results for orders placed during the hospital encounter of 04/02/14 (from the past 48 hour(s))  CBC WITH DIFFERENTIAL     Status: Abnormal   Collection Time    04/02/14 12:10 PM      Result Value Ref Range   WBC 18.3 (*) 4.0 - 10.5 K/uL   RBC 4.41  3.87 - 5.11 MIL/uL   Hemoglobin 14.0  12.0 - 15.0 g/dL   HCT 40.1  36.0 - 46.0 %   MCV 90.9  78.0 - 100.0 fL   MCH 31.7  26.0 - 34.0 pg   MCHC 34.9  30.0 - 36.0 g/dL   RDW 13.1  11.5 - 15.5 %   Platelets 224  150 - 400 K/uL   Neutrophils Relative % 85 (*) 43 - 77 %   Neutro Abs 15.5 (*) 1.7 - 7.7 K/uL   Lymphocytes Relative 8 (*) 12 - 46 %   Lymphs Abs 1.4  0.7 - 4.0 K/uL   Monocytes Relative 7  3 - 12 %   Monocytes Absolute 1.2 (*) 0.1 - 1.0 K/uL   Eosinophils Relative 0  0 - 5 %   Eosinophils Absolute 0.1  0.0 - 0.7 K/uL   Basophils Relative 0  0 - 1 %   Basophils Absolute 0.0  0.0 - 0.1 K/uL  COMPREHENSIVE METABOLIC PANEL     Status: Abnormal   Collection Time    04/02/14 12:10 PM      Result Value Ref Range   Sodium 133 (*) 137 - 147 mEq/L   Potassium 4.4  3.7 - 5.3 mEq/L   Comment: MODERATE HEMOLYSIS     HEMOLYSIS AT THIS LEVEL MAY AFFECT RESULT   Chloride 92 (*) 96 - 112 mEq/L   CO2 25  19 - 32 mEq/L   Glucose, Bld 252 (*) 70 - 99 mg/dL   BUN 7  6 - 23 mg/dL   Creatinine, Ser 0.67  0.50 - 1.10 mg/dL   Calcium 9.9  8.4 - 10.5 mg/dL   Total Protein 8.1  6.0 - 8.3 g/dL   Albumin 3.7  3.5 - 5.2 g/dL   AST 55 (*) 0 - 37 U/L   Comment: MODERATE HEMOLYSIS      HEMOLYSIS AT THIS LEVEL MAY AFFECT RESULT  ALT 48 (*) 0 - 35 U/L   Comment: MODERATE HEMOLYSIS     HEMOLYSIS AT THIS LEVEL MAY AFFECT RESULT   Alkaline Phosphatase 90  39 - 117 U/L   Comment: MODERATE HEMOLYSIS     HEMOLYSIS AT THIS LEVEL MAY AFFECT RESULT   Total Bilirubin 1.7 (*) 0.3 - 1.2 mg/dL   GFR calc non Af Amer >90  >90 mL/min   GFR calc Af Amer >90  >90 mL/min   Comment: (NOTE)     The eGFR has been calculated using the CKD EPI equation.     This calculation has not been validated in all clinical situations.     eGFR's persistently <90 mL/min signify possible Chronic Kidney     Disease.   Anion gap 16 (*) 5 - 15  LIPASE, BLOOD     Status: None   Collection Time    04/02/14 12:10 PM      Result Value Ref Range   Lipase 17  11 - 59 U/L  I-STAT TROPOININ, ED     Status: None   Collection Time    04/02/14 12:20 PM      Result Value Ref Range   Troponin i, poc 0.00  0.00 - 0.08 ng/mL   Comment 3            Comment: Due to the release kinetics of cTnI,     a negative result within the first hours     of the onset of symptoms does not rule out     myocardial infarction with certainty.     If myocardial infarction is still suspected,     repeat the test at appropriate intervals.  URINALYSIS, ROUTINE W REFLEX MICROSCOPIC     Status: Abnormal   Collection Time    04/02/14  1:10 PM      Result Value Ref Range   Color, Urine YELLOW  YELLOW   APPearance CLEAR  CLEAR   Specific Gravity, Urine 1.014  1.005 - 1.030   pH 6.5  5.0 - 8.0   Glucose, UA >1000 (*) NEGATIVE mg/dL   Hgb urine dipstick NEGATIVE  NEGATIVE   Bilirubin Urine NEGATIVE  NEGATIVE   Ketones, ur NEGATIVE  NEGATIVE mg/dL   Protein, ur NEGATIVE  NEGATIVE mg/dL   Urobilinogen, UA 2.0 (*) 0.0 - 1.0 mg/dL   Nitrite NEGATIVE  NEGATIVE   Leukocytes, UA NEGATIVE  NEGATIVE  URINE MICROSCOPIC-ADD ON     Status: Abnormal   Collection Time    04/02/14  1:10 PM      Result Value Ref Range   Squamous Epithelial /  LPF MANY (*) RARE   WBC, UA 0-2  <3 WBC/hpf   RBC / HPF 0-2  <3 RBC/hpf   Bacteria, UA MANY (*) RARE   US Abdomen Complete  04/01/2014   CLINICAL DATA:  Abdominal tenderness, right upper quadrant  EXAM: ULTRASOUND ABDOMEN COMPLETE  COMPARISON:  None.  FINDINGS: Gallbladder:  No gallstones or wall thickening visualized, measuring 2 mm. No sonographic Murphy sign noted. Gallbladder is distended. Measuring up to 6 cm in diameter.  Common bile duct:  Diameter: 3 mm  Liver:  Indeterminate 4 x 2 x 2.8 cm hypoechoic mass right lobe of the liver. Liver parenchyma otherwise unremarkable.  IVC:  No abnormality visualized.  Pancreas:  Visualized portion unremarkable.  Spleen:  Size and appearance within normal limits.  Right Kidney:  Length: 11.1 cm. Echogenicity within normal limits. No mass or hydronephrosis visualized.  Left Kidney:  Length: 13.5 cm. Echogenicity within normal limits. No mass or hydronephrosis visualized.  Abdominal aorta:  No aneurysm visualized.  Other findings:  None.  IMPRESSION: Indeterminate hypoechoic mass within the right lobe of the liver. Further evaluation with liver MRI recommended. These results will be called to the ordering clinician or representative by the Radiologist Assistant, and communication documented in the PACS or zVision Dashboard.  Distended gallbladder correlation with liver function tests recommended. Otherwise no sonographic evidence of cholecystitis.   Electronically Signed   By: Margaree Mackintosh M.D.   On: 04/01/2014 12:00   Ct Abdomen Pelvis W Contrast  04/02/2014   CLINICAL DATA:  Right upper quadrant pain.  EXAM: CT ABDOMEN AND PELVIS WITH CONTRAST  TECHNIQUE: Multidetector CT imaging of the abdomen and pelvis was performed using the standard protocol following bolus administration of intravenous contrast.  CONTRAST:  62m OMNIPAQUE IOHEXOL 300 MG/ML SOLN, 1039mOMNIPAQUE IOHEXOL 300 MG/ML SOLN  COMPARISON:  None.  FINDINGS: Abnormal inflammatory process right upper  quadrant. This surrounds the duodenum and gallbladder. On the recent ultrasound, the distended gallbladder did not have thickened wall as may be expected if findings were related to primary cholecystitis. The duodenum itself has slightly thickened walls and primary duodenal abnormality with secondary gallbladder distension is a consideration although primary gallbladder abnormality not entirely excluded. There is no evidence of extravasation of ingested contrast or free intraperitoneal air. The pancreas does not appear to be the primary inflammatory process. No calcified gallstones along the common bile duct. No intrahepatic biliary duct dilation.  Adenopathy peripancreatic region most likely related to the upper abdominal inflammatory process.  The small amount of fluid within the right abdomen extends towards the appendix which is markedly attenuated in caliber. Primary appendix process as a cause for the above described findings felt unlikely.  Hiatal hernia with distal esophageal wall thickening may be related to under distension or reflux. No definitive mass at this level is noted.  Within the anterior aspect of the medial segment of the left lobe of the liver, there is a 2.8 cm low-density structure which cannot be confirmed as a simple cyst on the present exam. This does not demonstrate delayed contrast enhanced imaging as may be seen with a hemangioma. Exact etiology indeterminate. This can be assessed on elective contrast-enhanced liver MR after the patient's acute episode has cleared.  No worrisome splenic, renal or adrenal abnormality.  No abdominal aortic aneurysm. Mild atherosclerotic type changes of the lower abdominal aorta our.  Noncontrast filled decompressed views of the urinary bladder without gross abnormality. Prior tubal ligation.  No bony destructive process. Mild hip joint degenerative changes noted bilaterally.  Minimal atelectatic changes lung bases.  IMPRESSION: Abnormal inflammatory  process right upper quadrant. This surrounds the duodenum and gallbladder. On the recent ultrasound, the distended gallbladder did not have thickened wall as may be expected if findings were related to primary cholecystitis. The duodenum itself has slightly thickened walls and primary duodenal abnormality with secondary gallbladder distension is a consideration although primary gallbladder abnormality not entirely excluded. There is no evidence of extravasation of ingested contrast or free intraperitoneal air.  Adenopathy peripancreatic region most likely related to the upper abdominal inflammatory process.  Hiatal hernia with distal esophageal wall thickening may be related to under distension or reflux. No definitive mass at this level is noted.  2.8 cm low-density structure within the anterior aspect of the medial segment of the left lobe of the liver, cannot be confirmed as a  simple cyst or hemangioma. Exact etiology indeterminate. This can be assessed on elective contrast-enhanced liver MR after the patient's acute episode has cleared.  These results were called by telephone at the time of interpretation on 04/02/2014 at 1:53 PM to Dr. Pryor Curia , who verbally acknowledged these results.   Electronically Signed   By: Chauncey Cruel M.D.   On: 04/02/2014 14:03    Review of Systems  Constitutional: Positive for fever (currently 100.3 oral, feels warmer than this.) and chills. Negative for weight loss, malaise/fatigue and diaphoresis.  HENT: Negative.        Glasses  Eyes: Negative.   Respiratory: Negative.  Negative for cough, hemoptysis, sputum production, shortness of breath and wheezing.   Cardiovascular: Positive for leg swelling (occasional). Negative for PND.  Gastrointestinal: Positive for heartburn (GERD controlled with PPI), nausea, vomiting, abdominal pain (Mostly in RUQ going into her back.  some in RLQ with palpation.) and constipation (chronic constipation). Negative for diarrhea, blood in  stool and melena.  Genitourinary: Negative.        Decreased Urine output over weekend.  Musculoskeletal: Positive for back pain (with RUQ pain).  Skin: Negative.        Some flushing  Neurological: Negative.  Negative for weakness.  Endo/Heme/Allergies: Negative.   Psychiatric/Behavioral: Positive for depression (ongoing since her son's death, better this year, than before.). The patient is nervous/anxious.     Blood pressure 133/61, pulse 103, temperature 98.4 F (36.9 C), temperature source Oral, resp. rate 24, SpO2 94.00%. Physical Exam  Constitutional: She is oriented to person, place, and time. She appears well-developed and well-nourished.  BMI 32  HENT:  Head: Normocephalic and atraumatic.  Nose: Nose normal.  Eyes: Conjunctivae and EOM are normal. Pupils are equal, round, and reactive to light. Right eye exhibits no discharge. Left eye exhibits no discharge. No scleral icterus.  Neck: Normal range of motion. Neck supple. No JVD present. No tracheal deviation present. No thyromegaly present.  Cardiovascular: Regular rhythm, normal heart sounds and intact distal pulses.  Exam reveals no gallop.   No murmur heard. Tachycardic BP 133/61  Pulse 103  Temp(Src) 98.4 F (36.9 C) (Oral)  Resp 24  SpO2 94%   Respiratory: Effort normal and breath sounds normal. No respiratory distress. She has no wheezes. She has no rales. She exhibits no tenderness.  GI: Soft. Bowel sounds are normal. She exhibits no mass. There is no tenderness ( She is very tender RUQ, some in the RLQ.  she is not tender on the left.  ). There is no rebound and no guarding.  Musculoskeletal: She exhibits no edema and no tenderness.  Lymphadenopathy:    She has no cervical adenopathy.  Neurological: She is alert and oriented to person, place, and time. No cranial nerve deficit.  Skin: Skin is warm and dry. No rash noted. No erythema. No pallor.  Psychiatric: She has a normal mood and affect. Her behavior is  normal. Judgment and thought content normal.     Assessment/Plan 1.  Acute cholecystitis vs duodenitis  2.  AODM ID 3.  Hypertension 4.  Depression/anxiety 5.  GERD/Hiatal hernia 6.  BMI 32  Plan:  I will admit her and start IV antibiotic coverage.  We will get a HIDA scan in the AM, I cannot tell if this is cholecystitis,  with no stones to explain it.  Or if it is a duodenitis.  I plan to give her PO meds till midnight and have her ready for  HIDA in the AM.   Maizee Reinhold 04/02/2014, 4:23 PM

## 2014-04-02 NOTE — H&P (Signed)
Prob cholecystitis but acalculus.  Get HIDA scan  Prob lap chole tomorrow if HIDA +.  If not, may involve GI  The anatomy & physiology of hepatobiliary & pancreatic function was discussed.  The pathophysiology of gallbladder dysfunction was discussed.  Natural history risks without surgery was discussed.   I feel the risks of no intervention will lead to serious problems that outweigh the operative risks; therefore, I recommended cholecystectomy to remove the pathology.  I explained laparoscopic techniques with possible need for an open approach.  Probable cholangiogram to evaluate the bilary tract was explained as well.    Risks such as bleeding, infection, abscess, leak, injury to other organs, need for further treatment, heart attack, death, and other risks were discussed.  I noted a good likelihood this will help address the problem.  Possibility that this will not correct all abdominal symptoms was explained.  Goals of post-operative recovery were discussed as well.  We will work to minimize complications.  An educational handout further explaining the pathology and treatment options was given as well.  Questions were answered.  The patient expresses understanding & wishes to proceed with surgery if w/u c/w that.Marland Kitchen

## 2014-04-02 NOTE — Progress Notes (Signed)
Patient has meds from home that need to be locked up.  Family will not be at the hospital until tomorrow

## 2014-04-02 NOTE — ED Provider Notes (Signed)
TIME SEEN: 11:34 AM  CHIEF COMPLAINT: Abdominal pain  HPI: Patient is a 58 y.o. F with history of anemia, depression, hypertension, diabetes who presents the emergency department with one week of right upper quadrant abdominal pain, nausea vomiting, chills. She had an ultrasound yesterday as an outpatient that showed a liver lesion and is scheduled to have an outpatient MRI. It also showed a distended gallbladder but no other sign of cholecystitis. She states her pain has been persistent. No aggravating or relieving factors. She is unable to describe it. No radiation. She denies any diarrhea, bloody stool or melena. No chest pain. She does state she has had some shortness of breath states she thinks this is secondary to pain. No history of cardiac disease. No history of PE or DVT. No history of prior abdominal surgery.  ROS: See HPI Constitutional: no fever  Eyes: no drainage  ENT: no runny nose   Cardiovascular:  no chest pain  Resp: no SOB  GI: no vomiting GU: no dysuria Integumentary: no rash  Allergy: no hives  Musculoskeletal: no leg swelling  Neurological: no slurred speech ROS otherwise negative  PAST MEDICAL HISTORY/PAST SURGICAL HISTORY:  Past Medical History  Diagnosis Date  . Allergy   . Anxiety   . Anemia   . Depression   . Diabetes mellitus without complication   . GERD (gastroesophageal reflux disease)   . Hypertension     MEDICATIONS:  Prior to Admission medications   Medication Sig Start Date End Date Taking? Authorizing Provider  acetaminophen (TYLENOL) 500 MG tablet Take 1,000 mg by mouth every 6 (six) hours as needed for moderate pain.   Yes Historical Provider, MD  cetirizine (ZYRTEC) 10 MG tablet Take 10 mg by mouth daily.   Yes Historical Provider, MD  Cyanocobalamin (VITAMIN B-12 CR PO) Take 1 tablet by mouth daily.   Yes Historical Provider, MD  DULoxetine (CYMBALTA) 60 MG capsule Take 120 mg by mouth daily.   Yes Historical Provider, MD  fluticasone  (FLONASE) 50 MCG/ACT nasal spray Place 2 sprays into the nose daily as needed for allergies.    Yes Historical Provider, MD  hydrochlorothiazide (HYDRODIURIL) 25 MG tablet Take 25 mg by mouth daily.   Yes Historical Provider, MD  hydrochlorothiazide (HYDRODIURIL) 25 MG tablet Take 25 mg by mouth daily.   Yes Historical Provider, MD  insulin glargine (LANTUS) 100 UNIT/ML injection Inject 21 Units into the skin daily.    Yes Historical Provider, MD  LORazepam (ATIVAN) 1 MG tablet Take 1 mg by mouth every 8 (eight) hours as needed for anxiety.    Yes Historical Provider, MD  omeprazole (PRILOSEC) 20 MG capsule Take 20 mg by mouth daily.   Yes Historical Provider, MD  simvastatin (ZOCOR) 10 MG tablet Take 10 mg by mouth daily.   Yes Historical Provider, MD  valsartan (DIOVAN) 80 MG tablet Take 80 mg by mouth daily.   Yes Historical Provider, MD  VITAMIN D, CHOLECALCIFEROL, PO Take 1 capsule by mouth daily.   Yes Historical Provider, MD  glyBURIDE-metformin (GLUCOVANCE) 1.25-250 MG per tablet Take 1 tablet by mouth daily with breakfast.    Historical Provider, MD    ALLERGIES:  No Known Allergies  SOCIAL HISTORY:  History  Substance Use Topics  . Smoking status: Never Smoker   . Smokeless tobacco: Not on file  . Alcohol Use: No    FAMILY HISTORY: Family History  Problem Relation Age of Onset  . Heart disease Father  EXAM: BP 120/56  Pulse 87  Temp(Src) 98.6 F (37 C) (Oral)  Resp 18  SpO2 97% CONSTITUTIONAL: Alert and oriented and responds appropriately to questions. Well-appearing; well-nourished HEAD: Normocephalic EYES: Conjunctivae clear, PERRL ENT: normal nose; no rhinorrhea; moist mucous membranes; pharynx without lesions noted NECK: Supple, no meningismus, no LAD  CARD: RRR; S1 and S2 appreciated; no murmurs, no clicks, no rubs, no gallops RESP: Normal chest excursion without splinting or tachypnea; breath sounds clear and equal bilaterally; no wheezes, no rhonchi, no  rales,  ABD/GI: Normal bowel sounds; non-distended; soft, tender to palpation in the right upper quadrant with voluntary guarding, no rebound, no peritoneal signs, positive Murphy sign, no tenderness at McBurney's point BACK:  The back appears normal and is non-tender to palpation, there is no CVA tenderness EXT: Normal ROM in all joints; non-tender to palpation; no edema; normal capillary refill; no cyanosis    SKIN: Normal color for age and race; warm NEURO: Moves all extremities equally PSYCH: The patient's mood and manner are appropriate. Grooming and personal hygiene are appropriate.  MEDICAL DECISION MAKING: Patient here with complaints of abdominal pain. Abdominal ultrasound yesterday shows a distended gallbladder but normal wall thickness. She appears very comfortable on exam. Will obtain abdominal labs, troponin, CT of her abdomen and pelvis, urine. Will give pain and nausea medication.  ED PROGRESS: Patient's labs show leukocytosis with left shift. She has mild elevation of her total bilirubin, AST and ALT. CT scan shows possible cholecystitis versus duodenitis. Discussed with Modena Jansky, PA with surgery who is seen the patient and will admit. We will start IV antibiotics. Patient is comfortable with this plan.    EKG Interpretation  Date/Time:  Tuesday April 02 2014 11:54:54 EDT Ventricular Rate:  71 PR Interval:  152 QRS Duration: 87 QT Interval:  369 QTC Calculation: 401 R Axis:   47 Text Interpretation:  Sinus rhythm Low voltage, precordial leads Baseline wander in lead(s) V6 Confirmed by WARD,  DO, KRISTEN ([id number]) on 04/02/2014 12:55:14 PM        [place], DO 04/02/14 1716

## 2014-04-02 NOTE — ED Notes (Signed)
Pt returned from CT °

## 2014-04-02 NOTE — ED Notes (Signed)
Patient transported to CT 

## 2014-04-03 ENCOUNTER — Inpatient Hospital Stay (HOSPITAL_COMMUNITY): Payer: 59 | Admitting: Certified Registered Nurse Anesthetist

## 2014-04-03 ENCOUNTER — Inpatient Hospital Stay (HOSPITAL_COMMUNITY): Payer: 59

## 2014-04-03 ENCOUNTER — Encounter (HOSPITAL_COMMUNITY): Admission: EM | Disposition: A | Discharge status: Home / Self Care | Payer: Self-pay | Source: Home / Self Care

## 2014-04-03 ENCOUNTER — Encounter (HOSPITAL_COMMUNITY): Payer: Self-pay | Admitting: Certified Registered Nurse Anesthetist

## 2014-04-03 ENCOUNTER — Encounter (HOSPITAL_COMMUNITY): Payer: 59 | Admitting: Certified Registered Nurse Anesthetist

## 2014-04-03 DIAGNOSIS — K219 Gastro-esophageal reflux disease without esophagitis: Secondary | ICD-10-CM | POA: Diagnosis present

## 2014-04-03 DIAGNOSIS — Z794 Long term (current) use of insulin: Secondary | ICD-10-CM | POA: Diagnosis present

## 2014-04-03 DIAGNOSIS — K810 Acute cholecystitis: Secondary | ICD-10-CM

## 2014-04-03 DIAGNOSIS — K7581 Nonalcoholic steatohepatitis (NASH): Secondary | ICD-10-CM | POA: Diagnosis present

## 2014-04-03 DIAGNOSIS — E1142 Type 2 diabetes mellitus with diabetic polyneuropathy: Secondary | ICD-10-CM | POA: Diagnosis present

## 2014-04-03 DIAGNOSIS — F419 Anxiety disorder, unspecified: Secondary | ICD-10-CM | POA: Diagnosis present

## 2014-04-03 DIAGNOSIS — I10 Essential (primary) hypertension: Secondary | ICD-10-CM | POA: Diagnosis present

## 2014-04-03 DIAGNOSIS — E669 Obesity, unspecified: Secondary | ICD-10-CM | POA: Diagnosis present

## 2014-04-03 DIAGNOSIS — R160 Hepatomegaly, not elsewhere classified: Secondary | ICD-10-CM | POA: Diagnosis present

## 2014-04-03 HISTORY — PX: CHOLECYSTECTOMY: SHX55

## 2014-04-03 LAB — GLUCOSE, CAPILLARY
Glucose-Capillary: 169 mg/dL — ABNORMAL HIGH (ref 70–99)
Glucose-Capillary: 150 mg/dL — ABNORMAL HIGH (ref 70–99)
Glucose-Capillary: 151 mg/dL — ABNORMAL HIGH (ref 70–99)
Glucose-Capillary: 246 mg/dL — ABNORMAL HIGH (ref 70–99)

## 2014-04-03 LAB — CBC
HEMATOCRIT: 36.8 % (ref 36.0–46.0)
HEMOGLOBIN: 12.3 g/dL (ref 12.0–15.0)
MCH: 31 pg (ref 26.0–34.0)
MCHC: 33.4 g/dL (ref 30.0–36.0)
MCV: 92.7 fL (ref 78.0–100.0)
Platelets: 234 10*3/uL (ref 150–400)
RBC: 3.97 MIL/uL (ref 3.87–5.11)
RDW: 13.2 % (ref 11.5–15.5)
WBC: 15.2 10*3/uL — AB (ref 4.0–10.5)

## 2014-04-03 LAB — COMPREHENSIVE METABOLIC PANEL
ALT: 36 U/L — ABNORMAL HIGH (ref 0–35)
AST: 24 U/L (ref 0–37)
Albumin: 3 g/dL — ABNORMAL LOW (ref 3.5–5.2)
Alkaline Phosphatase: 93 U/L (ref 39–117)
Anion gap: 15 (ref 5–15)
BILIRUBIN TOTAL: 1.3 mg/dL — AB (ref 0.3–1.2)
BUN: 6 mg/dL (ref 6–23)
CHLORIDE: 94 meq/L — AB (ref 96–112)
CO2: 26 mEq/L (ref 19–32)
Calcium: 9.4 mg/dL (ref 8.4–10.5)
Creatinine, Ser: 0.77 mg/dL (ref 0.50–1.10)
GFR calc Af Amer: >90 mL/min (ref >90)
GFR calc non Af Amer: >90 mL/min (ref >90)
Glucose, Bld: 161 mg/dL — ABNORMAL HIGH (ref 70–99)
POTASSIUM: 3 meq/L — AB (ref 3.7–5.3)
Sodium: 135 mEq/L — ABNORMAL LOW (ref 137–147)
TOTAL PROTEIN: 6.9 g/dL (ref 6.0–8.3)

## 2014-04-03 LAB — SURGICAL PCR SCREEN
MRSA, PCR: NEGATIVE
STAPHYLOCOCCUS AUREUS: POSITIVE — AB

## 2014-04-03 LAB — HEMOGLOBIN A1C
Hgb A1c MFr Bld: 7.9 % — ABNORMAL HIGH (ref <5.7)
MEAN PLASMA GLUCOSE: 180 mg/dL — AB (ref <117)

## 2014-04-03 LAB — LIPASE, BLOOD: Lipase: 12 U/L (ref 11–59)

## 2014-04-03 LAB — MAGNESIUM: Magnesium: 1.8 mg/dL (ref 1.5–2.5)

## 2014-04-03 SURGERY — LAPAROSCOPIC CHOLECYSTECTOMY WITH INTRAOPERATIVE CHOLANGIOGRAM
Anesthesia: General | Site: Abdomen

## 2014-04-03 MED ORDER — NEOSTIGMINE METHYLSULFATE 10 MG/10ML IV SOLN
INTRAVENOUS | Status: AC
  Filled 2014-04-03: qty 1

## 2014-04-03 MED ORDER — DEXAMETHASONE SODIUM PHOSPHATE 10 MG/ML IJ SOLN
INTRAMUSCULAR | Status: AC
  Filled 2014-04-03: qty 1

## 2014-04-03 MED ORDER — PHENYLEPHRINE HCL 10 MG/ML IJ SOLN
INTRAMUSCULAR | Status: DC | PRN
  Administered 2014-04-03 (×2): 80 ug via INTRAVENOUS

## 2014-04-03 MED ORDER — KETOROLAC TROMETHAMINE 30 MG/ML IJ SOLN
INTRAMUSCULAR | Status: DC | PRN
  Administered 2014-04-03: 30 mg via INTRAVENOUS

## 2014-04-03 MED ORDER — HYDROMORPHONE HCL PF 1 MG/ML IJ SOLN: 0.2500 mg | INTRAMUSCULAR | Status: DC | PRN

## 2014-04-03 MED ORDER — BUPIVACAINE-EPINEPHRINE 0.25% -1:200000 IJ SOLN
INTRAMUSCULAR | Status: AC
  Filled 2014-04-03: qty 1

## 2014-04-03 MED ORDER — LIDOCAINE HCL (CARDIAC) 20 MG/ML IV SOLN
INTRAVENOUS | Status: DC | PRN
  Administered 2014-04-03: 100 mg via INTRAVENOUS

## 2014-04-03 MED ORDER — FENTANYL CITRATE 0.05 MG/ML IJ SOLN
INTRAMUSCULAR | Status: DC | PRN
  Administered 2014-04-03 (×5): 50 ug via INTRAVENOUS

## 2014-04-03 MED ORDER — LACTATED RINGERS IV SOLN
INTRAVENOUS | Status: DC | PRN
  Administered 2014-04-03: 16:00:00 via INTRAVENOUS

## 2014-04-03 MED ORDER — NEOSTIGMINE METHYLSULFATE 10 MG/10ML IV SOLN
INTRAVENOUS | Status: DC | PRN
  Administered 2014-04-03: 5 mg via INTRAVENOUS

## 2014-04-03 MED ORDER — MIDAZOLAM HCL 2 MG/2ML IJ SOLN
INTRAMUSCULAR | Status: AC
  Filled 2014-04-03: qty 2

## 2014-04-03 MED ORDER — PHENYLEPHRINE 40 MCG/ML (10ML) SYRINGE FOR IV PUSH (FOR BLOOD PRESSURE SUPPORT)
PREFILLED_SYRINGE | INTRAVENOUS | Status: AC
  Filled 2014-04-03: qty 10

## 2014-04-03 MED ORDER — PROMETHAZINE HCL 25 MG/ML IJ SOLN: 6.2500 mg | INTRAMUSCULAR | Status: DC | PRN

## 2014-04-03 MED ORDER — PROPOFOL 10 MG/ML IV BOLUS
INTRAVENOUS | Status: DC | PRN
  Administered 2014-04-03: 150 mg via INTRAVENOUS

## 2014-04-03 MED ORDER — LACTATED RINGERS IV SOLN
INTRAVENOUS | Status: DC | PRN
  Administered 2014-04-03 (×2): via INTRAVENOUS

## 2014-04-03 MED ORDER — LACTATED RINGERS IV BOLUS (SEPSIS): 1000.0000 mL | Freq: Three times a day (TID) | INTRAVENOUS | Status: AC | PRN

## 2014-04-03 MED ORDER — ROCURONIUM BROMIDE 100 MG/10ML IV SOLN
INTRAVENOUS | Status: DC | PRN
  Administered 2014-04-03: 35 mg via INTRAVENOUS
  Administered 2014-04-03: 10 mg via INTRAVENOUS

## 2014-04-03 MED ORDER — SACCHAROMYCES BOULARDII 250 MG PO CAPS
250.0000 mg | ORAL_CAPSULE | Freq: Two times a day (BID) | ORAL | Status: DC
  Administered 2014-04-04 – 2014-04-07 (×7): 250 mg via ORAL
  Filled 2014-04-03 (×9): qty 1

## 2014-04-03 MED ORDER — GLYCOPYRROLATE 0.2 MG/ML IJ SOLN
INTRAMUSCULAR | Status: AC
  Filled 2014-04-03: qty 3

## 2014-04-03 MED ORDER — ONDANSETRON HCL 4 MG/2ML IJ SOLN
INTRAMUSCULAR | Status: DC | PRN
  Administered 2014-04-03: 4 mg via INTRAVENOUS

## 2014-04-03 MED ORDER — EPHEDRINE SULFATE 50 MG/ML IJ SOLN
INTRAMUSCULAR | Status: DC | PRN
  Administered 2014-04-03: 10 mg via INTRAVENOUS

## 2014-04-03 MED ORDER — INSULIN GLARGINE 100 UNIT/ML ~~LOC~~ SOLN
21.0000 [IU] | Freq: Every day | SUBCUTANEOUS | Status: DC
  Administered 2014-04-03 – 2014-04-07 (×5): 21 [IU] via SUBCUTANEOUS
  Filled 2014-04-03 (×5): qty 0.21

## 2014-04-03 MED ORDER — ROCURONIUM BROMIDE 100 MG/10ML IV SOLN
INTRAVENOUS | Status: AC
  Filled 2014-04-03: qty 1

## 2014-04-03 MED ORDER — MIDAZOLAM HCL 5 MG/5ML IJ SOLN
INTRAMUSCULAR | Status: DC | PRN
  Administered 2014-04-03: 2 mg via INTRAVENOUS

## 2014-04-03 MED ORDER — GLYCOPYRROLATE 0.2 MG/ML IJ SOLN
INTRAMUSCULAR | Status: DC | PRN
  Administered 2014-04-03: 0.6 mg via INTRAVENOUS

## 2014-04-03 MED ORDER — 0.9 % SODIUM CHLORIDE (POUR BTL) OPTIME
TOPICAL | Status: DC | PRN
  Administered 2014-04-03: 1000 mL

## 2014-04-03 MED ORDER — MUPIROCIN 2 % EX OINT
TOPICAL_OINTMENT | CUTANEOUS | Status: AC
  Filled 2014-04-03: qty 22

## 2014-04-03 MED ORDER — FENTANYL CITRATE 0.05 MG/ML IJ SOLN
INTRAMUSCULAR | Status: AC
  Filled 2014-04-03: qty 5

## 2014-04-03 MED ORDER — BUPIVACAINE-EPINEPHRINE 0.25% -1:200000 IJ SOLN
INTRAMUSCULAR | Status: DC | PRN
  Administered 2014-04-03: 50 mL

## 2014-04-03 MED ORDER — KETOROLAC TROMETHAMINE 30 MG/ML IJ SOLN
INTRAMUSCULAR | Status: AC
  Filled 2014-04-03: qty 1

## 2014-04-03 MED ORDER — POTASSIUM CHLORIDE 10 MEQ/100ML IV SOLN
10.0000 meq | INTRAVENOUS | Status: AC
  Administered 2014-04-03: 10 meq via INTRAVENOUS
  Filled 2014-04-03 (×3): qty 100

## 2014-04-03 MED ORDER — TECHNETIUM TC 99M MEBROFENIN IV KIT
5.5000 | PACK | Freq: Once | INTRAVENOUS | Status: AC | PRN
  Administered 2014-04-03: 5.5 via INTRAVENOUS

## 2014-04-03 MED ORDER — DEXAMETHASONE SODIUM PHOSPHATE 10 MG/ML IJ SOLN
INTRAMUSCULAR | Status: DC | PRN
  Administered 2014-04-03: 10 mg via INTRAVENOUS

## 2014-04-03 MED ORDER — LORATADINE 10 MG PO TABS
10.0000 mg | ORAL_TABLET | Freq: Every day | ORAL | Status: DC
  Administered 2014-04-03 – 2014-04-07 (×5): 10 mg via ORAL
  Filled 2014-04-03 (×5): qty 1

## 2014-04-03 MED ORDER — LACTATED RINGERS IV SOLN: INTRAVENOUS | Status: DC

## 2014-04-03 MED ORDER — POTASSIUM CHLORIDE IN NACL 40-0.9 MEQ/L-% IV SOLN
INTRAVENOUS | Status: DC
  Administered 2014-04-04: 100 mL/h via INTRAVENOUS
  Filled 2014-04-03 (×4): qty 1000

## 2014-04-03 MED ORDER — MORPHINE SULFATE 4 MG/ML IJ SOLN
INTRAMUSCULAR | Status: AC
  Filled 2014-04-03: qty 1

## 2014-04-03 MED ORDER — ONDANSETRON HCL 4 MG/2ML IJ SOLN
INTRAMUSCULAR | Status: AC
  Filled 2014-04-03: qty 2

## 2014-04-03 MED ORDER — LACTATED RINGERS IR SOLN
Status: DC | PRN
  Administered 2014-04-03: 4000 mL

## 2014-04-03 MED ORDER — CHLORHEXIDINE GLUCONATE CLOTH 2 % EX PADS
6.0000 | MEDICATED_PAD | Freq: Every day | CUTANEOUS | Status: DC
  Administered 2014-04-04 – 2014-04-07 (×4): 6 via TOPICAL

## 2014-04-03 MED ORDER — SUCCINYLCHOLINE CHLORIDE 20 MG/ML IJ SOLN
INTRAMUSCULAR | Status: DC | PRN
  Administered 2014-04-03: 100 mg via INTRAVENOUS

## 2014-04-03 MED ORDER — MUPIROCIN 2 % EX OINT
1.0000 "application " | TOPICAL_OINTMENT | Freq: Two times a day (BID) | CUTANEOUS | Status: DC
  Administered 2014-04-03 – 2014-04-07 (×8): 1 via NASAL

## 2014-04-03 MED ORDER — MORPHINE SULFATE 4 MG/ML IJ SOLN
3.0000 mg | Freq: Once | INTRAMUSCULAR | Status: AC
  Administered 2014-04-03: 3 mg via INTRAVENOUS

## 2014-04-03 MED ORDER — LIDOCAINE HCL (CARDIAC) 20 MG/ML IV SOLN
INTRAVENOUS | Status: AC
  Filled 2014-04-03: qty 5

## 2014-04-03 MED ORDER — PROPOFOL 10 MG/ML IV BOLUS
INTRAVENOUS | Status: AC
  Filled 2014-04-03: qty 20

## 2014-04-03 SURGICAL SUPPLY — 39 items
APPLIER CLIP 5 13 M/L LIGAMAX5 (MISCELLANEOUS) ×2
APPLIER CLIP ROT 10 11.4 M/L (STAPLE) ×2
CABLE HIGH FREQUENCY MONO STRZ (ELECTRODE) IMPLANT
CANISTER SUCTION 2500CC (MISCELLANEOUS) IMPLANT
CLIP APPLIE 5 13 M/L LIGAMAX5 (MISCELLANEOUS) ×1 IMPLANT
CLIP APPLIE ROT 10 11.4 M/L (STAPLE) ×1 IMPLANT
COVER MAYO STAND STRL (DRAPES) ×2 IMPLANT
DECANTER SPIKE VIAL GLASS SM (MISCELLANEOUS) ×2 IMPLANT
DRAIN CHANNEL 19F RND (DRAIN) ×2 IMPLANT
DRAPE C-ARM 42X120 X-RAY (DRAPES) ×2 IMPLANT
DRAPE LAPAROSCOPIC ABDOMINAL (DRAPES) ×2 IMPLANT
DRAPE UTILITY XL STRL (DRAPES) ×2 IMPLANT
DRAPE WARM FLUID 44X44 (DRAPE) ×2 IMPLANT
DRSG TEGADERM 2-3/8X2-3/4 SM (GAUZE/BANDAGES/DRESSINGS) ×6 IMPLANT
DRSG TEGADERM 4X4.75 (GAUZE/BANDAGES/DRESSINGS) IMPLANT
ELECT REM PT RETURN 9FT ADLT (ELECTROSURGICAL) ×2
ELECTRODE REM PT RTRN 9FT ADLT (ELECTROSURGICAL) ×1 IMPLANT
ENDOLOOP SUT PDS II  0 18 (SUTURE) ×2
ENDOLOOP SUT PDS II 0 18 (SUTURE) ×2 IMPLANT
EVACUATOR SILICONE 100CC (DRAIN) ×2 IMPLANT
GLOVE ECLIPSE 8.0 STRL XLNG CF (GLOVE) ×2 IMPLANT
GLOVE INDICATOR 8.0 STRL GRN (GLOVE) ×2 IMPLANT
GOWN STRL REUS W/TWL XL LVL3 (GOWN DISPOSABLE) ×6 IMPLANT
KIT BASIN OR (CUSTOM PROCEDURE TRAY) ×2 IMPLANT
POUCH SPECIMEN RETRIEVAL 10MM (ENDOMECHANICALS) ×2 IMPLANT
SCISSORS LAP 5X35 DISP (ENDOMECHANICALS) ×2 IMPLANT
SET CHOLANGIOGRAPH MIX (MISCELLANEOUS) ×2 IMPLANT
SET IRRIG TUBING LAPAROSCOPIC (IRRIGATION / IRRIGATOR) ×2 IMPLANT
SLEEVE XCEL OPT CAN 5 100 (ENDOMECHANICALS) ×2 IMPLANT
SUT MNCRL AB 4-0 PS2 18 (SUTURE) ×2 IMPLANT
SUT PDS AB 1 CT1 27 (SUTURE) ×2 IMPLANT
SUT PROLENE 2 0 CT2 30 (SUTURE) ×4 IMPLANT
SYRINGE 20CC LL (MISCELLANEOUS) ×2 IMPLANT
TOWEL OR 17X26 10 PK STRL BLUE (TOWEL DISPOSABLE) ×2 IMPLANT
TOWEL OR NON WOVEN STRL DISP B (DISPOSABLE) IMPLANT
TRAY LAP CHOLE (CUSTOM PROCEDURE TRAY) ×2 IMPLANT
TROCAR BLADELESS OPT 5 100 (ENDOMECHANICALS) ×2 IMPLANT
TROCAR XCEL NON-BLD 11X100MML (ENDOMECHANICALS) ×2 IMPLANT
TUBING INSUFFLATION 10FT LAP (TUBING) ×2 IMPLANT

## 2014-04-03 NOTE — Op Note (Addendum)
04/02/2014 - 04/03/2014  5:48 PM  PATIENT:  Jeanette Rose  58 y.o. female  Patient Care Team: Kandice Hams, MD as PCP - General (Internal Medicine) Cloyd Stagers, MD as Consulting Physician (Internal Medicine) Darlin Coco, MD as Consulting Physician (Cardiology)  PRE-OPERATIVE DIAGNOSIS:  cholecystitis, cholelithiasis  POST-OPERATIVE DIAGNOSIS:    Acute cholecystitis w/o cholelithiasis  PROCEDURE:  Procedure(s): LAPAROSCOPIC LYSIS OF ADHESIONS X 60 MIN LAPAROSCOPIC CHOLECYSTECTOMY   SURGEON:  Surgeon(s): Adin Hector, MD  ASSISTANT: RN   ANESTHESIA:   local and general  EBL:  Total I/O In: 861.7 [I.V.:461.7; IV Piggyback:400] Out: 550 [Urine:500; Blood:50]  Delay start of Pharmacological VTE agent (>24hrs) due to surgical blood loss or risk of bleeding:  no  DRAINS: (19FR) Blake drain(s) in the RUQ   SPECIMEN:  Source of Specimen:  Gallbladder  DISPOSITION OF SPECIMEN:  PATHOLOGY  COUNTS:  YES  PLAN OF CARE: Admit to inpatient   PATIENT DISPOSITION:  PACU - hemodynamically stable.  INDICATION:   Pleasant obese female with recurrent episodes of biliary colic.  No more constant pain.  CT scan showing into gluteal inflammation and gallbladder inflammation.  No gallstones seen on CT scan or ultrasound.  NMedicine scan with cystic duct obstruction concerning for cholecystitis.  Therefore, cholecystectomy recommended:  The anatomy & physiology of hepatobiliary & pancreatic function was discussed.  The pathophysiology of gallbladder dysfunction was discussed.  Natural history risks without surgery was discussed.   I feel the risks of no intervention will lead to serious problems that outweigh the operative risks; therefore, I recommended cholecystectomy to remove the pathology.  I explained laparoscopic techniques with possible need for an open approach.  Probable cholangiogram to evaluate the bilary tract was explained as well.    Risks such as  bleeding, infection, abscess, leak, injury to other organs, need for further treatment, heart attack, death, and other risks were discussed.  I noted a good likelihood this will help address the problem.  Possibility that this will not correct all abdominal symptoms was explained.  Goals of post-operative recovery were discussed as well.  We will work to minimize complications.  An educational handout further explaining the pathology and treatment options was given as well.  Questions were answered.  The patient expresses understanding & wishes to proceed with surgery.  OR FINDINGS: Dilated and large gallbladder with gallbladder wall thickening.  Some early empyema of the gallbladder.  Cholangiogram done not done due to short cystic duct and consistent inflammation.  Significant fatty change of the liver.  Cyst on anterior surface of central liver.  DESCRIPTION:   The patient was identified & brought into the operating room. The patient was positioned supine with arms tucked. SCDs were active during the entire case. The patient underwent general anesthesia without any difficulty.  The abdomen was prepped and draped in a sterile fashion. A Surgical Timeout confirmed our plan.  We positioned the patient in reverse [place] & right side up.  I placed a 11m laparoscopic port through the abdominal wall using optical entry technique in the right upper quadrant.  Entry was clean.  We induced carbon dioxide insufflation. Camera inspection revealed no injury. There were no adhesions to the anterior abdominal wall supraumbilically.  I proceeded to continue with laparoscopic technique. I placed a #5 port in supraumbilical region, another 523mport in the right flank near the anterior axillary line, and a 1035mort in the left subxiphoid region obliquely within the falciform ligament.  I turned  attention to the right upper quadrant.  The mesocolon and greater omentum was adherent to the gallbladder.  It was  inflamed and dimpled consistent with acute cholecystitis.  I could not grasp the gallbladder.  I aspirated thick bile with some purulence within it.  I then could elevate the gallbladder.  The liver had significant steatohepatitis this and was rigid.. I used hook cautery to free the peritoneal coverings between the gallbladder and the liver on the posteriolateral and anteriomedial walls.  However, was difficult to get down to the infundibulum.  I therefore transitioned over to a dome down technique.  I freed the gallbladder from its adhesions to the liver and the gallbladder fossa.  It was rather intrahepatic.  He did moderate oozing but was able to provide hemostasis with spatula-tip cautery.  Eventually freed down and came down to a more narrow point.  Skeletonized the infundibulum.  The gallbladder was markedly thickened.  Isolated the posterior and anterior cystic artery branches and skeletonized them.  I ligated with clips high on the gallbladder side and transected them.  This left a very short cystic duct with inflamed lymph node of [place] & moderate thickening.  I felt it was not safe to do a cholangiogram so I aborted.  I do not feel clips would go across as well.  I therefore ligated the cystic duct using 0 PDS Endoloops x2.  Came across the infundibulum sharply with scissors.  I placed the gallbladder inside an Endo Catch bag and removed it out the subxiphoid port.  I have been opened up the wound did 2 half centimeters to get it out as the gallbladder was markedly thickened.  I ensured hemostasis on the gallbladder fossa of the liver and elsewhere. I inspected the rest of the abdomen & detected no injury nor bleeding elsewhere.  Because of significant cholecystitis inflammation and the thickened infundibular/cystic duct stump, I decided to place a Penrose drain.  Brought out the right upper quadrant port site.  Drain goes posterior to the right hepatic lobe and then wraps around the gallbladder fossa.   I closed the subxiphoid fascia transversely using #1 PDS interrupted stitches  I closed the skin using 4-0 monocryl stitch.  Sterile dressings were applied. The patient was extubated & arrived in the PACU in stable condition..  I had discussed postoperative care with the patient in the holding area.  I am about to locate the patient's family and discuss operative findings and postoperative goals / instructions.  Instructions are written in the chart as well.

## 2014-04-03 NOTE — Anesthesia Postprocedure Evaluation (Signed)
  Anesthesia Post-op Note  Patient: Jeanette Rose  Procedure(s) Performed: Procedure(s) (LRB): LAPAROSCOPIC CHOLECYSTECTOMY  (N/A)  Patient Location: PACU  Anesthesia Type: General  Level of Consciousness: awake and alert   Airway and Oxygen Therapy: Patient Spontanous Breathing  Post-op Pain: mild  Post-op Assessment: Post-op Vital signs reviewed, Patient's Cardiovascular Status Stable, Respiratory Function Stable, Patent Airway and No signs of Nausea or vomiting  Last Vitals:  Filed Vitals:   04/03/14 1839  BP: 126/56  Pulse: 74  Temp: 37.1 C  Resp: 12    Post-op Vital Signs: stable   Complications: No apparent anesthesia complications

## 2014-04-03 NOTE — Progress Notes (Signed)
Subjective: Down for HIDA earlier and results are Positive, the formal reading is not back yet, but Dr. Johney Maine has reviewed and plans surgery this afternoon.  No further nausea/vomiting.  She didn't think she was tender till i palpated her RUQ.  Objective: Vital signs in last 24 hours: Temp:  [97.9 F (36.6 C)-101.5 F (38.6 C)] 97.9 F (36.6 C) (07/08 1009) Pulse Rate:  [66-108] 100 (07/08 1009) Resp:  [16-28] 17 (07/08 1009) BP: (103-146)/(52-70) 109/68 mmHg (07/08 1009) SpO2:  [92 %-100 %] 93 % (07/08 1009) Weight:  [86.183 kg (190 lb)] 86.183 kg (190 lb) (07/08 0100) Last BM Date: 03/03/14 60 PO Temp 0500 100.5, VSS K+3.0 LFT'S BETTER WBC  Still up 15.2 glucose is up. Intake/Output from previous day: 07/07 0701 - 07/08 0700 In: 1251.7 [P.O.:60; I.V.:1191.7] Out: 300 [Urine:300] Intake/Output this shift: Total I/O In: -  Out: 300 [Urine:300]  General appearance: alert, cooperative and no distress Resp: clear to auscultation bilaterally GI: still tender especially with palpation to the RUQ.  she had some pain earlier, but better with MS given in HIDA.  Lab Results:   Recent Labs  04/02/14 1210 04/03/14 0445  WBC 18.3* 15.2*  HGB 14.0 12.3  HCT 40.1 36.8  PLT 224 234    BMET  Recent Labs  04/02/14 1210 04/03/14 0445  NA 133* 135*  K 4.4 3.0*  CL 92* 94*  CO2 25 26  GLUCOSE 252* 161*  BUN 7 6  CREATININE 0.67 0.77  CALCIUM 9.9 9.4   PT/INR  Recent Labs  04/02/14 1810  LABPROT 13.8  INR 1.06     Recent Labs Lab 04/02/14 1210 04/03/14 0445  AST 55* 24  ALT 48* 36*  ALKPHOS 90 93  BILITOT 1.7* 1.3*  PROT 8.1 6.9  ALBUMIN 3.7 3.0*     Lipase     Component Value Date/Time   LIPASE 12 04/03/2014 0445     Studies/Results: US Abdomen Complete  04/01/2014   CLINICAL DATA:  Abdominal tenderness, right upper quadrant  EXAM: ULTRASOUND ABDOMEN COMPLETE  COMPARISON:  None.  FINDINGS: Gallbladder:  No gallstones or wall thickening  visualized, measuring 2 mm. No sonographic Murphy sign noted. Gallbladder is distended. Measuring up to 6 cm in diameter.  Common bile duct:  Diameter: 3 mm  Liver:  Indeterminate 4 x 2 x 2.8 cm hypoechoic mass right lobe of the liver. Liver parenchyma otherwise unremarkable.  IVC:  No abnormality visualized.  Pancreas:  Visualized portion unremarkable.  Spleen:  Size and appearance within normal limits.  Right Kidney:  Length: 11.1 cm. Echogenicity within normal limits. No mass or hydronephrosis visualized.  Left Kidney:  Length: 13.5 cm. Echogenicity within normal limits. No mass or hydronephrosis visualized.  Abdominal aorta:  No aneurysm visualized.  Other findings:  None.  IMPRESSION: Indeterminate hypoechoic mass within the right lobe of the liver. Further evaluation with liver MRI recommended. These results will be called to the ordering clinician or representative by the Radiologist Assistant, and communication documented in the PACS or zVision Dashboard.  Distended gallbladder correlation with liver function tests recommended. Otherwise no sonographic evidence of cholecystitis.   Electronically Signed   By: Margaree Mackintosh M.D.   On: 04/01/2014 12:00   Ct Abdomen Pelvis W Contrast  04/02/2014   CLINICAL DATA:  Right upper quadrant pain.  EXAM: CT ABDOMEN AND PELVIS WITH CONTRAST  TECHNIQUE: Multidetector CT imaging of the abdomen and pelvis was performed using the standard protocol following bolus administration of intravenous  contrast.  CONTRAST:  1m OMNIPAQUE IOHEXOL 300 MG/ML SOLN, 1012mOMNIPAQUE IOHEXOL 300 MG/ML SOLN  COMPARISON:  None.  FINDINGS: Abnormal inflammatory process right upper quadrant. This surrounds the duodenum and gallbladder. On the recent ultrasound, the distended gallbladder did not have thickened wall as may be expected if findings were related to primary cholecystitis. The duodenum itself has slightly thickened walls and primary duodenal abnormality with secondary gallbladder  distension is a consideration although primary gallbladder abnormality not entirely excluded. There is no evidence of extravasation of ingested contrast or free intraperitoneal air. The pancreas does not appear to be the primary inflammatory process. No calcified gallstones along the common bile duct. No intrahepatic biliary duct dilation.  Adenopathy peripancreatic region most likely related to the upper abdominal inflammatory process.  The small amount of fluid within the right abdomen extends towards the appendix which is markedly attenuated in caliber. Primary appendix process as a cause for the above described findings felt unlikely.  Hiatal hernia with distal esophageal wall thickening may be related to under distension or reflux. No definitive mass at this level is noted.  Within the anterior aspect of the medial segment of the left lobe of the liver, there is a 2.8 cm low-density structure which cannot be confirmed as a simple cyst on the present exam. This does not demonstrate delayed contrast enhanced imaging as may be seen with a hemangioma. Exact etiology indeterminate. This can be assessed on elective contrast-enhanced liver MR after the patient's acute episode has cleared.  No worrisome splenic, renal or adrenal abnormality.  No abdominal aortic aneurysm. Mild atherosclerotic type changes of the lower abdominal aorta our.  Noncontrast filled decompressed views of the urinary bladder without gross abnormality. Prior tubal ligation.  No bony destructive process. Mild hip joint degenerative changes noted bilaterally.  Minimal atelectatic changes lung bases.  IMPRESSION: Abnormal inflammatory process right upper quadrant. This surrounds the duodenum and gallbladder. On the recent ultrasound, the distended gallbladder did not have thickened wall as may be expected if findings were related to primary cholecystitis. The duodenum itself has slightly thickened walls and primary duodenal abnormality with  secondary gallbladder distension is a consideration although primary gallbladder abnormality not entirely excluded. There is no evidence of extravasation of ingested contrast or free intraperitoneal air.  Adenopathy peripancreatic region most likely related to the upper abdominal inflammatory process.  Hiatal hernia with distal esophageal wall thickening may be related to under distension or reflux. No definitive mass at this level is noted.  2.8 cm low-density structure within the anterior aspect of the medial segment of the left lobe of the liver, cannot be confirmed as a simple cyst or hemangioma. Exact etiology indeterminate. This can be assessed on elective contrast-enhanced liver MR after the patient's acute episode has cleared.  These results were called by telephone at the time of interpretation on 04/02/2014 at 1:53 PM to Dr. KRPryor Curia who verbally acknowledged these results.   Electronically Signed   By: StChauncey Cruel.D.   On: 04/02/2014 14:03    Medications: . ampicillin-sulbactam (UNASYN) IV  3 g Intravenous Q6H  . DULoxetine  120 mg Oral Daily  . hydrochlorothiazide  25 mg Oral Daily  . insulin aspart  0-20 Units Subcutaneous TID WC  . lip balm  1 application Topical BID  . pantoprazole  40 mg Oral Daily    Assessment/Plan 1. Acute cholecystitis vs duodenitis  2. AODM ID  3. Hypertension  4. Depression/anxiety  5. GERD/Hiatal hernia  6.  BMI 32  Plan:  We are going to take her to the OR as soon as they are ready.  We have discussed this and she is agreeable.I have explained the procedure, risks, and aftercare of cholecystectomy.  Risks include but are not limited to bleeding, infection, wound problems, anesthesia, diarrhea, bile leak, injury to common bile duct/liver/intestine.  She seems to understand and agrees to proceed. I ordered K+ replacement earlier this AM.      LOS: 1 day    Teryn Gust 04/03/2014

## 2014-04-03 NOTE — Progress Notes (Signed)
HIDA scan consistent with cystic duct obstruction.  Seems consistent with cholecystitis.  White count a little better but still abnormal.  I think she would benefit from cholecystectomy.

## 2014-04-03 NOTE — Transfer of Care (Signed)
Immediate Anesthesia Transfer of Care Note  Patient: Jeanette Rose  Procedure(s) Performed: Procedure(s) (LRB): LAPAROSCOPIC CHOLECYSTECTOMY  (N/A)  Patient Location: PACU  Anesthesia Type: General  Level of Consciousness: sedated, patient cooperative and responds to stimulation  Airway & Oxygen Therapy: Patient Spontanous Breathing and Patient connected to face mask oxgen  Post-op Assessment: Report given to PACU RN and Post -op Vital signs reviewed and stable  Post vital signs: Reviewed and stable  Complications: No apparent anesthesia complications

## 2014-04-03 NOTE — Care Management Note (Signed)
    Page 1 of 1   04/03/2014     10:27:38 AM CARE MANAGEMENT NOTE 04/03/2014  Patient:  Jeanette Rose, Jeanette Rose   Account Number:  [id number]  Date Initiated:  04/03/2014  Documentation initiated by:  Sunday Spillers  Subjective/Objective Assessment:   58 yo female admitted with acute cholecystitis. PTA lived at home alone.     Action/Plan:   Home when stable   Anticipated DC Date:  04/06/2014   Anticipated DC Plan:  [place]  CM consult      Choice offered to / List presented to:             Status of service:  Completed, signed off Medicare Important Message given?  NA - LOS <3 / Initial given by admissions (If response is "NO", the following Medicare IM given date fields will be blank) Date Medicare IM given:   Medicare IM given by:   Date Additional Medicare IM given:   Additional Medicare IM given by:    Discharge Disposition:  HOME/SELF CARE  Per UR Regulation:  Reviewed for med. necessity/level of care/duration of stay  If discussed at [place] of Stay Meetings, dates discussed:    Comments:

## 2014-04-03 NOTE — Anesthesia Preprocedure Evaluation (Signed)
Anesthesia Evaluation  Patient identified by MRN, date of birth, ID band Patient awake    Reviewed: Allergy & Precautions, H&P , NPO status , Patient's Chart, lab work & pertinent test results  Airway Mallampati: III TM Distance: >3 FB Neck ROM: Full    Dental  (+) Teeth Intact, Dental Advisory Given   Pulmonary neg pulmonary ROS,  breath sounds clear to auscultation  Pulmonary exam normal       Cardiovascular hypertension, Pt. on medications Rhythm:Regular Rate:Normal     Neuro/Psych Anxiety TIA   GI/Hepatic Neg liver ROS, GERD-  ,  Endo/Other  diabetes, Type 1, Insulin Dependent, Oral Hypoglycemic Agents  Renal/GU negative Renal ROS  negative genitourinary   Musculoskeletal negative musculoskeletal ROS (+)   Abdominal   Peds negative pediatric ROS (+)  Hematology negative hematology ROS (+)   Anesthesia Other Findings   Reproductive/Obstetrics negative OB ROS                           Anesthesia Physical Anesthesia Plan  ASA: III and emergent  Anesthesia Plan: General   Post-op Pain Management:    Induction: Intravenous  Airway Management Planned: Oral ETT  Additional Equipment:   Intra-op Plan:   Post-operative Plan: Extubation in OR  Informed Consent: I have reviewed the patients History and Physical, chart, labs and discussed the procedure including the risks, benefits and alternatives for the proposed anesthesia with the patient or authorized representative who has indicated his/her understanding and acceptance.   Dental advisory given  Plan Discussed with: CRNA  Anesthesia Plan Comments:         Anesthesia Quick Evaluation

## 2014-04-04 ENCOUNTER — Encounter (HOSPITAL_COMMUNITY): Payer: Self-pay | Admitting: Surgery

## 2014-04-04 LAB — BASIC METABOLIC PANEL
ANION GAP: 11 (ref 5–15)
BUN: 14 mg/dL (ref 6–23)
CALCIUM: 9.2 mg/dL (ref 8.4–10.5)
CO2: 24 meq/L (ref 19–32)
Chloride: 99 mEq/L (ref 96–112)
Creatinine, Ser: 0.73 mg/dL (ref 0.50–1.10)
GFR calc Af Amer: >90 mL/min (ref >90)
GFR calc non Af Amer: >90 mL/min (ref >90)
Glucose, Bld: 313 mg/dL — ABNORMAL HIGH (ref 70–99)
Potassium: 4.2 mEq/L (ref 3.7–5.3)
Sodium: 134 mEq/L — ABNORMAL LOW (ref 137–147)

## 2014-04-04 LAB — HEPATIC FUNCTION PANEL
ALT: 33 U/L (ref 0–35)
AST: 32 U/L (ref 0–37)
Albumin: 2.3 g/dL — ABNORMAL LOW (ref 3.5–5.2)
Alkaline Phosphatase: 81 U/L (ref 39–117)
BILIRUBIN DIRECT: 0.3 mg/dL (ref 0.0–0.3)
BILIRUBIN TOTAL: 0.5 mg/dL (ref 0.3–1.2)
Indirect Bilirubin: 0.2 mg/dL — ABNORMAL LOW (ref 0.3–0.9)
Total Protein: 6.3 g/dL (ref 6.0–8.3)

## 2014-04-04 LAB — CBC
HCT: 33.3 % — ABNORMAL LOW (ref 36.0–46.0)
Hemoglobin: 11.1 g/dL — ABNORMAL LOW (ref 12.0–15.0)
MCH: 31 pg (ref 26.0–34.0)
MCHC: 33.3 g/dL (ref 30.0–36.0)
MCV: 93 fL (ref 78.0–100.0)
PLATELETS: 171 10*3/uL (ref 150–400)
RBC: 3.58 MIL/uL — ABNORMAL LOW (ref 3.87–5.11)
RDW: 13.2 % (ref 11.5–15.5)
WBC: 9.7 10*3/uL (ref 4.0–10.5)

## 2014-04-04 LAB — GLUCOSE, CAPILLARY
GLUCOSE-CAPILLARY: 111 mg/dL — AB (ref 70–99)
Glucose-Capillary: 282 mg/dL — ABNORMAL HIGH (ref 70–99)
Glucose-Capillary: 298 mg/dL — ABNORMAL HIGH (ref 70–99)
Glucose-Capillary: 110 mg/dL — ABNORMAL HIGH (ref 70–99)

## 2014-04-04 MED ORDER — HEPARIN SODIUM (PORCINE) 5000 UNIT/ML IJ SOLN
5000.0000 [IU] | Freq: Three times a day (TID) | INTRAMUSCULAR | Status: DC
  Administered 2014-04-04 – 2014-04-07 (×8): 5000 [IU] via SUBCUTANEOUS
  Filled 2014-04-04 (×11): qty 1

## 2014-04-04 MED ORDER — GLYBURIDE-METFORMIN 1.25-250 MG PO TABS: 1.0000 | ORAL_TABLET | Freq: Every day | ORAL | Status: DC

## 2014-04-04 MED ORDER — IRBESARTAN 75 MG PO TABS
75.0000 mg | ORAL_TABLET | Freq: Every day | ORAL | Status: DC
  Administered 2014-04-04 – 2014-04-07 (×4): 75 mg via ORAL
  Filled 2014-04-04 (×4): qty 1

## 2014-04-04 MED ORDER — GLYBURIDE 1.25 MG PO TABS
1.2500 mg | ORAL_TABLET | ORAL | Status: DC
  Administered 2014-04-04 – 2014-04-07 (×4): 1.25 mg via ORAL
  Filled 2014-04-04 (×4): qty 1

## 2014-04-04 MED ORDER — METFORMIN HCL 500 MG PO TABS
250.0000 mg | ORAL_TABLET | ORAL | Status: DC
  Administered 2014-04-04 – 2014-04-07 (×4): 250 mg via ORAL
  Filled 2014-04-04 (×4): qty 1

## 2014-04-04 NOTE — Progress Notes (Signed)
1 Day Post-Op  Subjective: Sore but feels much better.   Drain is clear, she is tolerating clears well.  Objective: Vital signs in last 24 hours: Temp:  [97.3 F (36.3 C)-99.2 F (37.3 C)] 97.6 F (36.4 C) (07/09 1014) Pulse Rate:  [60-108] 67 (07/09 1014) Resp:  [11-24] 15 (07/09 1014) BP: (102-134)/(56-81) 107/68 mmHg (07/09 1014) SpO2:  [92 %-99 %] 96 % (07/09 1014) Last BM Date: 04/03/14 Afebrile, VSS  Diet: clears labs OK No IOC 95 ML from the drain. Intake/Output from previous day: 07/08 0701 - 07/09 0700 In: 4355 [P.O.:240; I.V.:3415; IV Piggyback:700] Out: 0102 [Urine:1050; Drains:95; Blood:50] Intake/Output this shift: Total I/O In: -  Out: 860 [Urine:800; Drains:60]  General appearance: alert, cooperative and no distress Resp: clear to auscultation bilaterally GI: soft sore, few BS, port sites look fine.  drain is clear serous fluid.  Lab Results:   Recent Labs  04/03/14 0445 04/04/14 0705  WBC 15.2* 9.7  HGB 12.3 11.1*  HCT 36.8 33.3*  PLT 234 171    BMET  Recent Labs  04/03/14 0445 04/04/14 0705  NA 135* 134*  K 3.0* 4.2  CL 94* 99  CO2 26 24  GLUCOSE 161* 313*  BUN 6 14  CREATININE 0.77 0.73  CALCIUM 9.4 9.2   PT/INR  Recent Labs  04/02/14 1810  LABPROT 13.8  INR 1.06     Recent Labs Lab 04/02/14 1210 04/03/14 0445  AST 55* 24  ALT 48* 36*  ALKPHOS 90 93  BILITOT 1.7* 1.3*  PROT 8.1 6.9  ALBUMIN 3.7 3.0*     Lipase     Component Value Date/Time   LIPASE 12 04/03/2014 0445     Studies/Results: Nm Hepatobiliary  04/03/2014   CLINICAL DATA:  Gallbladder distention and inflammation on CT. No gallstones evident on ultrasound.  EXAM: NUCLEAR MEDICINE HEPATOBILIARY IMAGING  TECHNIQUE: Sequential images of the abdomen were obtained out to 90 minutes following intravenous administration of radiopharmaceutical. 1.7 mg of morphine was administered at 90 min.  RADIOPHARMACEUTICALS:  5.5 Millicurie VO-53G Choletec  COMPARISON:  CT  04/03/1999 50  FINDINGS: There is homogeneous uptake of radiotracer within the liver. Counts are evident within the small bowel by 10 min. The gallbladder was not visualized at 90 min therefore an ejection fraction could not calculated. Morphine was administered to to visualization gallbladder. The gallbladder was not visualized up to 60 min followed morphine injection.  IMPRESSION: 1. Nonvisualization gallbladder following morphine injection is consistent with cystic duct obstruction. Findings suggest acalculous cholecystitis. 2. Patent common bile duct. 3.  Findings conveyed toSteve Grosson 04/03/2014  at14:23.   Electronically Signed   By: Suzy Bouchard M.D.   On: 04/03/2014 14:27   Ct Abdomen Pelvis W Contrast  04/02/2014   CLINICAL DATA:  Right upper quadrant pain.  EXAM: CT ABDOMEN AND PELVIS WITH CONTRAST  TECHNIQUE: Multidetector CT imaging of the abdomen and pelvis was performed using the standard protocol following bolus administration of intravenous contrast.  CONTRAST:  21m OMNIPAQUE IOHEXOL 300 MG/ML SOLN, 1088mOMNIPAQUE IOHEXOL 300 MG/ML SOLN  COMPARISON:  None.  FINDINGS: Abnormal inflammatory process right upper quadrant. This surrounds the duodenum and gallbladder. On the recent ultrasound, the distended gallbladder did not have thickened wall as may be expected if findings were related to primary cholecystitis. The duodenum itself has slightly thickened walls and primary duodenal abnormality with secondary gallbladder distension is a consideration although primary gallbladder abnormality not entirely excluded. There is no evidence of extravasation of  ingested contrast or free intraperitoneal air. The pancreas does not appear to be the primary inflammatory process. No calcified gallstones along the common bile duct. No intrahepatic biliary duct dilation.  Adenopathy peripancreatic region most likely related to the upper abdominal inflammatory process.  The small amount of fluid within the right  abdomen extends towards the appendix which is markedly attenuated in caliber. Primary appendix process as a cause for the above described findings felt unlikely.  Hiatal hernia with distal esophageal wall thickening may be related to under distension or reflux. No definitive mass at this level is noted.  Within the anterior aspect of the medial segment of the left lobe of the liver, there is a 2.8 cm low-density structure which cannot be confirmed as a simple cyst on the present exam. This does not demonstrate delayed contrast enhanced imaging as may be seen with a hemangioma. Exact etiology indeterminate. This can be assessed on elective contrast-enhanced liver MR after the patient's acute episode has cleared.  No worrisome splenic, renal or adrenal abnormality.  No abdominal aortic aneurysm. Mild atherosclerotic type changes of the lower abdominal aorta our.  Noncontrast filled decompressed views of the urinary bladder without gross abnormality. Prior tubal ligation.  No bony destructive process. Mild hip joint degenerative changes noted bilaterally.  Minimal atelectatic changes lung bases.  IMPRESSION: Abnormal inflammatory process right upper quadrant. This surrounds the duodenum and gallbladder. On the recent ultrasound, the distended gallbladder did not have thickened wall as may be expected if findings were related to primary cholecystitis. The duodenum itself has slightly thickened walls and primary duodenal abnormality with secondary gallbladder distension is a consideration although primary gallbladder abnormality not entirely excluded. There is no evidence of extravasation of ingested contrast or free intraperitoneal air.  Adenopathy peripancreatic region most likely related to the upper abdominal inflammatory process.  Hiatal hernia with distal esophageal wall thickening may be related to under distension or reflux. No definitive mass at this level is noted.  2.8 cm low-density structure within the  anterior aspect of the medial segment of the left lobe of the liver, cannot be confirmed as a simple cyst or hemangioma. Exact etiology indeterminate. This can be assessed on elective contrast-enhanced liver MR after the patient's acute episode has cleared.  These results were called by telephone at the time of interpretation on 04/02/2014 at 1:53 PM to Dr. Pryor Curia , who verbally acknowledged these results.   Electronically Signed   By: Chauncey Cruel M.D.   On: 04/02/2014 14:03    Medications: . ampicillin-sulbactam (UNASYN) IV  3 g Intravenous Q6H  . Chlorhexidine Gluconate Cloth  6 each Topical Daily  . DULoxetine  120 mg Oral Daily  . hydrochlorothiazide  25 mg Oral Daily  . insulin aspart  0-20 Units Subcutaneous TID WC  . insulin glargine  21 Units Subcutaneous Daily  . lip balm  1 application Topical BID  . loratadine  10 mg Oral Daily  . mupirocin ointment  1 application Nasal BID  . pantoprazole  40 mg Oral Daily  . saccharomyces boulardii  250 mg Oral BID   . 0.9 % NaCl with KCl 40 mEq / L 100 mL/hr (04/04/14 0407)     Assessment/Plan 1. Acute cholecystitis without cholelithiasis  LAPAROSCOPIC LYSIS OF ADHESIONS X 60 MIN, LAPAROSCOPIC CHOLECYSTECTOMY,  04/03/2014, Adin Hector, MD 2. AODM ID  3. Hypertension  4. Depression/anxiety  5. GERD/Hiatal hernia  6. BMI 32   Plan:  I will stop IV antibiotics after  next dose, and advance diet.  If she does well home in AM. We will ask nursing to teach drain care at home.  Restart home meds, and heparin for DVT tonight. SCD's for DVT also.  LOS: 2 days    Shawnell Dykes 04/04/2014

## 2014-04-04 NOTE — Progress Notes (Signed)
Patient feeling much better. RUQ pain much less Drain output serosanguineous.  I discussed intraoperative findings and reasoning for drainage and antibiotics.  D/C patient from hospital when patient meets criteria (anticipate in 1 day(s)):  Tolerating oral intake well Ambulating in walkways Adequate pain control without IV medications Urinating  Having flatus

## 2014-04-05 ENCOUNTER — Encounter (INDEPENDENT_AMBULATORY_CARE_PROVIDER_SITE_OTHER): Payer: Self-pay | Admitting: General Surgery

## 2014-04-05 DIAGNOSIS — R339 Retention of urine, unspecified: Secondary | ICD-10-CM

## 2014-04-05 LAB — URINALYSIS, ROUTINE W REFLEX MICROSCOPIC
Bilirubin Urine: NEGATIVE
GLUCOSE, UA: NEGATIVE mg/dL
Hgb urine dipstick: NEGATIVE
Ketones, ur: NEGATIVE mg/dL
Nitrite: NEGATIVE
PH: 6 (ref 5.0–8.0)
PROTEIN: NEGATIVE mg/dL
SPECIFIC GRAVITY, URINE: 1.023 (ref 1.005–1.030)
Urobilinogen, UA: 2 mg/dL — ABNORMAL HIGH (ref 0.0–1.0)

## 2014-04-05 LAB — GLUCOSE, CAPILLARY
GLUCOSE-CAPILLARY: 72 mg/dL (ref 70–99)
Glucose-Capillary: 107 mg/dL — ABNORMAL HIGH (ref 70–99)
Glucose-Capillary: 94 mg/dL (ref 70–99)
Glucose-Capillary: 103 mg/dL — ABNORMAL HIGH (ref 70–99)

## 2014-04-05 LAB — URINE MICROSCOPIC-ADD ON

## 2014-04-05 MED ORDER — POLYETHYLENE GLYCOL 3350 17 G PO PACK
17.0000 g | PACK | Freq: Every day | ORAL | Status: DC
  Administered 2014-04-05 – 2014-04-07 (×3): 17 g via ORAL
  Filled 2014-04-05 (×3): qty 1

## 2014-04-05 MED ORDER — SODIUM CHLORIDE 0.9 % IV BOLUS (SEPSIS)
500.0000 mL | Freq: Once | INTRAVENOUS | Status: AC
  Administered 2014-04-05: 500 mL via INTRAVENOUS

## 2014-04-05 MED ORDER — KETOROLAC TROMETHAMINE 15 MG/ML IJ SOLN
30.0000 mg | Freq: Four times a day (QID) | INTRAMUSCULAR | Status: AC | PRN
  Administered 2014-04-05: 30 mg via INTRAVENOUS

## 2014-04-05 MED ORDER — LACTATED RINGERS IV BOLUS (SEPSIS)
1000.0000 mL | Freq: Once | INTRAVENOUS | Status: AC
  Administered 2014-04-05: 1000 mL via INTRAVENOUS

## 2014-04-05 NOTE — Progress Notes (Signed)
2 Days Post-Op  Subjective: She cannot void, no BM, and it's really hard for her to get OOB She is able to eat well, Pain is primarily at the site of the drain. Nurse report this AM 0429:Pt reported trouble urinating. Pt stated she would strain as hard as she could only get a tiny bit out.. Pt states she would have a constant urge to void. I&O cath completed per order. Pt put out 250 ml of dark amber urine with sediment. Bladder scanner was unavailable at time and pt was having too much pressure to wait. Will scan bladder to see if there is any residual when bladder scanner is available. Post void residual was only 25 ml. Will continue to monitor and report to day shift.   Objective: Vital signs in last 24 hours: Temp:  [97.4 F (36.3 C)-98 F (36.7 C)] 97.4 F (36.3 C) (07/10 0534) Pulse Rate:  [60-81] 60 (07/10 0534) Resp:  [15-18] 16 (07/10 0534) BP: (107-129)/(63-71) 129/63 mmHg (07/10 0534) SpO2:  [93 %-99 %] 94 % (07/10 0534) Last BM Date: 04/03/14 520 PO 130 from the drain Afebrile, VSS No labs Intake/Output from previous day: 07/09 0701 - 07/10 0700 In: 1606.7 [P.O.:520; I.V.:686.7; IV Piggyback:400] Out: 2605 [Urine:2475; Drains:130] Intake/Output this shift: Total I/O In: 240 [P.O.:240] Out: -   General appearance: alert, cooperative and no distress Resp: clear to auscultation bilaterally GI: very sore, hard just to sit up.  trouble voiding, drinking liquids,  No Bm, taking regular diet.  Lab Results:   Recent Labs  04/03/14 0445 04/04/14 0705  WBC 15.2* 9.7  HGB 12.3 11.1*  HCT 36.8 33.3*  PLT 234 171    BMET  Recent Labs  04/03/14 0445 04/04/14 0705  NA 135* 134*  K 3.0* 4.2  CL 94* 99  CO2 26 24  GLUCOSE 161* 313*  BUN 6 14  CREATININE 0.77 0.73  CALCIUM 9.4 9.2   PT/INR  Recent Labs  04/02/14 1810  LABPROT 13.8  INR 1.06     Recent Labs Lab 04/02/14 1210 04/03/14 0445 04/04/14 0705  AST 55* 24 32  ALT 48* 36* 33  ALKPHOS 90  93 81  BILITOT 1.7* 1.3* 0.5  PROT 8.1 6.9 6.3  ALBUMIN 3.7 3.0* 2.3*     Lipase     Component Value Date/Time   LIPASE 12 04/03/2014 0445     Studies/Results: Nm Hepatobiliary  04/03/2014   CLINICAL DATA:  Gallbladder distention and inflammation on CT. No gallstones evident on ultrasound.  EXAM: NUCLEAR MEDICINE HEPATOBILIARY IMAGING  TECHNIQUE: Sequential images of the abdomen were obtained out to 90 minutes following intravenous administration of radiopharmaceutical. 1.7 mg of morphine was administered at 90 min.  RADIOPHARMACEUTICALS:  5.5 [employer]ie ZY-60Y Choletec  COMPARISON:  CT 04/03/1999 50  FINDINGS: There is homogeneous uptake of radiotracer within the liver. Counts are evident within the small bowel by 10 min. The gallbladder was not visualized at 90 min therefore an ejection fraction could not calculated. Morphine was administered to to visualization gallbladder. The gallbladder was not visualized up to 60 min followed morphine injection.  IMPRESSION: 1. Nonvisualization gallbladder following morphine injection is consistent with cystic duct obstruction. Findings suggest acalculous cholecystitis. 2. Patent common bile duct. 3.  Findings conveyed toSteve Rose 04/03/2014  at14:23.   Electronically Signed   By: Suzy Bouchard M.D.   On: 04/03/2014 14:27    Medications: . ampicillin-sulbactam (UNASYN) IV  3 g Intravenous Q6H  . Chlorhexidine Gluconate Cloth  6 each Topical Daily  . DULoxetine  120 mg Oral Daily  . glyBURIDE  1.25 mg Oral Q24H   And  . metFORMIN  250 mg Oral Q24H  . heparin subcutaneous  5,000 Units Subcutaneous 3 times per day  . hydrochlorothiazide  25 mg Oral Daily  . insulin aspart  0-20 Units Subcutaneous TID WC  . insulin glargine  21 Units Subcutaneous Daily  . irbesartan  75 mg Oral Daily  . lip balm  1 application Topical BID  . loratadine  10 mg Oral Daily  . mupirocin ointment  1 application Nasal BID  . pantoprazole  40 mg Oral Daily  .  saccharomyces boulardii  250 mg Oral BID    Assessment/Plan 1. Acute cholecystitis without cholelithiasis  LAPAROSCOPIC LYSIS OF ADHESIONS X 60 MIN, LAPAROSCOPIC CHOLECYSTECTOMY, 04/03/2014, Jeanette Hector, MD  2. AODM ID  3. Hypertension  4. Depression/anxiety  5. GERD/Hiatal hernia  6. BMI 32   Plan:  Fluid bolus, check urine, continue pain meds, Miralax for constipation.  Continue antibiotics for now.  LOS: 3 days    Jeanette Rose 04/05/2014

## 2014-04-05 NOTE — Progress Notes (Addendum)
Pt reported trouble urinating. Pt stated she would strain as hard as she could only get a tiny bit out.. Pt states she would have a constant urge to void. I&O cath completed per order. Pt put out 250 ml of dark amber urine with sediment. Bladder scanner was unavailable at time and pt was having too much pressure to wait. Will scan bladder to see if there is any residual when bladder scanner is available. Post void residual was only 25 ml. Will continue to monitor and report to day shift.

## 2014-04-05 NOTE — Progress Notes (Signed)
IVF bolus Drain care  D/C patient from hospital when patient meets criteria (anticipate in 0-1 day(s)):  Tolerating oral intake well Ambulating in walkways Adequate pain control without IV medications Urinating  Having flatus

## 2014-04-06 LAB — CBC
HCT: 30.7 % — ABNORMAL LOW (ref 36.0–46.0)
Hemoglobin: 10.4 g/dL — ABNORMAL LOW (ref 12.0–15.0)
MCH: 31 pg (ref 26.0–34.0)
MCHC: 33.9 g/dL (ref 30.0–36.0)
MCV: 91.6 fL (ref 78.0–100.0)
Platelets: 268 10*3/uL (ref 150–400)
RBC: 3.35 MIL/uL — ABNORMAL LOW (ref 3.87–5.11)
RDW: 13.3 % (ref 11.5–15.5)
WBC: 7.9 10*3/uL (ref 4.0–10.5)

## 2014-04-06 LAB — COMPREHENSIVE METABOLIC PANEL
ALBUMIN: 2.5 g/dL — AB (ref 3.5–5.2)
ALT: 42 U/L — ABNORMAL HIGH (ref 0–35)
ANION GAP: 14 (ref 5–15)
AST: 50 U/L — ABNORMAL HIGH (ref 0–37)
Alkaline Phosphatase: 76 U/L (ref 39–117)
BUN: 24 mg/dL — AB (ref 6–23)
CHLORIDE: 101 meq/L (ref 96–112)
CO2: 26 meq/L (ref 19–32)
CREATININE: 0.98 mg/dL (ref 0.50–1.10)
Calcium: 9.2 mg/dL (ref 8.4–10.5)
GFR calc Af Amer: 73 mL/min — ABNORMAL LOW (ref >90)
GFR, EST NON AFRICAN AMERICAN: 63 mL/min — AB (ref >90)
Glucose, Bld: 70 mg/dL (ref 70–99)
Potassium: 3.1 mEq/L — ABNORMAL LOW (ref 3.7–5.3)
Sodium: 141 mEq/L (ref 137–147)
Total Bilirubin: 0.3 mg/dL (ref 0.3–1.2)
Total Protein: 6.1 g/dL (ref 6.0–8.3)

## 2014-04-06 LAB — GLUCOSE, CAPILLARY
GLUCOSE-CAPILLARY: 103 mg/dL — AB (ref 70–99)
Glucose-Capillary: 67 mg/dL — ABNORMAL LOW (ref 70–99)
Glucose-Capillary: 62 mg/dL — ABNORMAL LOW (ref 70–99)
Glucose-Capillary: 92 mg/dL (ref 70–99)
Glucose-Capillary: 132 mg/dL — ABNORMAL HIGH (ref 70–99)
Glucose-Capillary: 75 mg/dL (ref 70–99)

## 2014-04-06 NOTE — Progress Notes (Signed)
Patient ID: Jeanette Rose, female   DOB: [birth year], 58 y.o.   MRN: [id number] 3 Days Post-Op  Subjective: Still quite a bit of right upper quadrant discomfort, particularly when moving. No better or worse.Tolerated diet without nausea yesterday.  Objective: Vital signs in last 24 hours: Temp:  [97.1 F (36.2 C)-97.9 F (36.6 C)] 97.9 F (36.6 C) (07/11 0542) Pulse Rate:  [65-78] 78 (07/11 0542) Resp:  [16-18] 16 (07/11 0542) BP: (111-126)/(72-77) 111/72 mmHg (07/11 0542) SpO2:  [93 %-95 %] 93 % (07/11 0542) Last BM Date: 04/02/14  Intake/Output from previous day: 07/10 0701 - 07/11 0700 In: 2540 [P.O.:840; IV Piggyback:1700] Out: 2030 [Urine:1900; Drains:130] Intake/Output this shift:    General appearance: alert, cooperative and no distress GI: abnormal findings:  moderate tenderness in the RUQ and mild distention Incision/Wound: dressings clean and dry. JP drainage non bilious  Lab Results:   Recent Labs  04/04/14 0705 04/06/14 0454  WBC 9.7 7.9  HGB 11.1* 10.4*  HCT 33.3* 30.7*  PLT 171 268   BMET  Recent Labs  04/04/14 0705 04/06/14 0454  NA 134* 141  K 4.2 3.1*  CL 99 101  CO2 24 26  GLUCOSE 313* 70  BUN 14 24*  CREATININE 0.73 0.98  CALCIUM 9.2 9.2     Studies/Results: No results found.  Anti-infectives: Anti-infectives   Start     Dose/Rate Route Frequency Ordered Stop   04/02/14 1800  piperacillin-tazobactam (ZOSYN) IVPB 3.375 g  Status:  Discontinued     3.375 g 12.5 mL/hr over 240 Minutes Intravenous Every 8 hours 04/02/14 1617 04/02/14 1641   04/02/14 1700  Ampicillin-Sulbactam (UNASYN) 3 g in sodium chloride 0.9 % 100 mL IVPB     3 g 100 mL/hr over 60 Minutes Intravenous Every 6 hours 04/02/14 1641 04/06/14 1113      Assessment/Plan: s/p Procedure(s): LAPAROSCOPIC CHOLECYSTECTOMY  Severe acute cholecystitis. Still a fair amount of pain and tenderness. Continue IV antibiotics and observe in the hospital today. Repeat labs in  a.m.   LOS: 4 days    Shamaria Kavan T 04/06/2014

## 2014-04-06 NOTE — Progress Notes (Signed)
Hypoglycemic Event  CBG: 67@ 7:29  Treatment: 15 GM carbohydrate snack x2  Symptoms: None  Follow-up CBG: Time:0840 CBG Result:92  Possible Reasons for Event: Unknown  Comments/MD notified:Dr Hoxworth no change to orders    Leo Grosser U  Remember to initiate Hypoglycemia Order Set & complete

## 2014-04-07 LAB — CBC
HEMATOCRIT: 34.1 % — AB (ref 36.0–46.0)
Hemoglobin: 11.1 g/dL — ABNORMAL LOW (ref 12.0–15.0)
MCH: 30.1 pg (ref 26.0–34.0)
MCHC: 32.6 g/dL (ref 30.0–36.0)
MCV: 92.4 fL (ref 78.0–100.0)
Platelets: 311 10*3/uL (ref 150–400)
RBC: 3.69 MIL/uL — ABNORMAL LOW (ref 3.87–5.11)
RDW: 13.5 % (ref 11.5–15.5)
WBC: 9.2 10*3/uL (ref 4.0–10.5)

## 2014-04-07 LAB — COMPREHENSIVE METABOLIC PANEL
ALK PHOS: 94 U/L (ref 39–117)
ALT: 48 U/L — ABNORMAL HIGH (ref 0–35)
ANION GAP: 14 (ref 5–15)
AST: 36 U/L (ref 0–37)
Albumin: 2.8 g/dL — ABNORMAL LOW (ref 3.5–5.2)
BUN: 11 mg/dL (ref 6–23)
CALCIUM: 9.4 mg/dL (ref 8.4–10.5)
CO2: 27 mEq/L (ref 19–32)
Chloride: 99 mEq/L (ref 96–112)
Creatinine, Ser: 0.77 mg/dL (ref 0.50–1.10)
GFR calc non Af Amer: >90 mL/min (ref >90)
GLUCOSE: 94 mg/dL (ref 70–99)
Potassium: 3.2 mEq/L — ABNORMAL LOW (ref 3.7–5.3)
Sodium: 140 mEq/L (ref 137–147)
TOTAL PROTEIN: 6.6 g/dL (ref 6.0–8.3)
Total Bilirubin: 0.5 mg/dL (ref 0.3–1.2)

## 2014-04-07 LAB — GLUCOSE, CAPILLARY
GLUCOSE-CAPILLARY: 110 mg/dL — AB (ref 70–99)
Glucose-Capillary: 177 mg/dL — ABNORMAL HIGH (ref 70–99)

## 2014-04-07 MED ORDER — OXYCODONE HCL 5 MG PO TABS: 5.0000 mg | ORAL_TABLET | ORAL | Status: DC | PRN

## 2014-04-07 NOTE — Progress Notes (Signed)
Provided pt with discharge teaching on how to empty and take care of her JP drain. Pt performed returned demonstration and emptied it correctly. Also provided pt with informational handouts on drain care to reinforce what I had taught her. I also answered pt's questions to her satisfaction. Instructed her if she had any more questions after discharge she could call the nursing unit or the physician's office.

## 2014-04-07 NOTE — Progress Notes (Signed)
Patient ID: Jeanette Rose, female   DOB: Feb 20, 1956, 58 y.o.   MRN: [id number] 4 Days Post-Op  Subjective:  Has occasional "spasms" in the right upper quadrant that hurt but between times he is generally feeling better. Tolerating diet.  Objective: Vital signs in last 24 hours: Temp:  [98.2 F (36.8 C)-98.6 F (37 C)] 98.4 F (36.9 C) (07/12 0621) Pulse Rate:  [80-85] 85 (07/12 0621) Resp:  [16-18] 18 (07/12 0621) BP: (120-130)/(78-81) 120/78 mmHg (07/12 0621) SpO2:  [94 %-96 %] 96 % (07/12 0621) Last BM Date: 04/02/14  Intake/Output from previous day: 07/11 0701 - 07/12 0700 In: 720 [P.O.:720] Out: 3380 [Urine:3300; Drains:80] Intake/Output this shift: Total I/O In: 240 [P.O.:240] Out: 400 [Urine:400]  General appearance: alert, cooperative and no distress GI: mild tenderness right upper quadrant. Incision/Wound: incisions clean and dry. The JP drainage serosanguineous  Lab Results:   Recent Labs  04/06/14 0454 04/07/14 0524  WBC 7.9 9.2  HGB 10.4* 11.1*  HCT 30.7* 34.1*  PLT 268 311   BMET  Recent Labs  04/06/14 0454 04/07/14 0524  NA 141 140  K 3.1* 3.2*  CL 101 99  CO2 26 27  GLUCOSE 70 94  BUN 24* 11  CREATININE 0.98 0.77  CALCIUM 9.2 9.4   Lab Results  Component Value Date   ALT 48* 04/07/2014   AST 36 04/07/2014   ALKPHOS 94 04/07/2014   BILITOT 0.5 04/07/2014     Studies/Results: No results found.  Anti-infectives: Anti-infectives   Start     Dose/Rate Route Frequency Ordered Stop   04/02/14 1800  piperacillin-tazobactam (ZOSYN) IVPB 3.375 g  Status:  Discontinued     3.375 g 12.5 mL/hr over 240 Minutes Intravenous Every 8 hours 04/02/14 1617 04/02/14 1641   04/02/14 1700  Ampicillin-Sulbactam (UNASYN) 3 g in sodium chloride 0.9 % 100 mL IVPB     3 g 100 mL/hr over 60 Minutes Intravenous Every 6 hours 04/02/14 1641 04/06/14 1113      Assessment/Plan: s/p Procedure(s): LAPAROSCOPIC CHOLECYSTECTOMY  Improving, no sign of infection  or other complication. Okay for discharge. Lead JP drain for removal in the office per surgeons plan.   LOS: 5 days    Nykayla Marcelli T 04/07/2014

## 2014-04-07 NOTE — Discharge Instructions (Signed)
[facility] ______[facility] LAPAROSCOPIC SURGERY: POST OP INSTRUCTIONS Always review your discharge instruction sheet given to you by the facility where your surgery was performed. IF YOU HAVE DISABILITY OR FAMILY LEAVE FORMS, YOU MUST BRING THEM TO THE OFFICE FOR PROCESSING.   DO NOT GIVE THEM TO YOUR DOCTOR.  1. A prescription for pain medication may be given to you upon discharge.  Take your pain medication as prescribed, if needed.  If narcotic pain medicine is not needed, then you may take acetaminophen (Tylenol) or ibuprofen (Advil) as needed. 2. Take your usually prescribed medications unless otherwise directed. 3. If you need a refill on your pain medication, please contact your pharmacy.  They will contact our office to request authorization. Prescriptions will not be filled after 5pm or on week-ends. 4. You should follow a light diet the first few days after arrival home, such as soup and crackers, etc.  Be sure to include lots of fluids daily. 5. Most patients will experience some swelling and bruising in the area of the incisions.  Ice packs will help.  Swelling and bruising can take several days to resolve.  6. It is common to experience some constipation if taking pain medication after surgery.  Increasing fluid intake and taking a stool softener (such as Colace) will usually help or prevent this problem from occurring.  A mild laxative (Milk of Magnesia or Miralax) should be taken according to package instructions if there are no bowel movements after 48 hours. 7. Unless discharge instructions indicate otherwise, you may remove your bandages 24-48 hours after surgery, and you may shower at that time.  You may have steri-strips (small skin tapes) in place directly over the incision.  These strips should be left on the skin for 7-10 days.  If your surgeon used skin glue on the incision, you may shower in 24 hours.  The glue will flake off over the next 2-3 weeks.  Any sutures or  staples will be removed at the office during your follow-up visit. 8. ACTIVITIES:  You may resume regular (light) daily activities beginning the next day--such as daily self-care, walking, climbing stairs--gradually increasing activities as tolerated.  You may have sexual intercourse when it is comfortable.  Refrain from any heavy lifting or straining until approved by your doctor. a. You may drive when you are no longer taking prescription pain medication, you can comfortably wear a seatbelt, and you can safely maneuver your car and apply brakes. b. RETURN TO WORK:  __________________________________________________________ 9. You should see your doctor in the office for a follow-up appointment approximately 2-3 weeks after your surgery.  Make sure that you call for this appointment within a day or two after you arrive home to insure a convenient appointment time. 10. OTHER INSTRUCTIONS: __________________________________________________________________________________________________________________________ __________________________________________________________________________________________________________________________ WHEN TO CALL YOUR DOCTOR: 1. Fever over 101.0 2. Inability to urinate 3. Continued bleeding from incision. 4. Increased pain, redness, or drainage from the incision. 5. Increasing abdominal pain  The clinic staff is available to answer your questions during regular business hours.  Please dont hesitate to call and ask to speak to one of the nurses for clinical concerns.  If you have a medical emergency, go to the nearest emergency room or call 911.  A surgeon from [facility] is always on call at the hospital. [address], [place], [place], [place]  [address] ? [address], [place], [place]   [address] [phone] ? [phone] ? FAX [phone] Web site:  [email or web address]  Bulb Drain Home Care A bulb drain consists of a thin  rubber tube and a soft, round bulb that creates a gentle suction. The rubber tube is placed in the area where you had surgery. A bulb is attached to the end of the tube that is outside the body. The bulb drain removes excess fluid that normally builds up in a surgical wound after surgery. The color and amount of fluid will vary. Immediately after surgery, the fluid is bright red and is a little thicker than water. It may gradually change to a yellow or pink color and become more thin and water-like. When the amount decreases to about 1 or 2 tbsp in 24 hours, your health care provider will usually remove it. DAILY CARE  Keep the bulb flat (compressed) at all times, except while emptying it. The flatness creates suction. You can flatten the bulb by squeezing it firmly in the middle and then closing the cap.  Keep sites where the tube enters the skin dry and covered with a bandage (dressing).  Secure the tube 1-2 in (2.5-5.1 cm) below the insertion sites, to keep it from pulling on your stitches. The tube is stitched in place and will not slip out.  Secure the bulb as directed by your health care provider.  For the first 3 days after surgery, there usually is more fluid in the bulb. Empty the bulb whenever it becomes half full because the bulb does not create enough suction if it is too full. The bulb could also overflow. Write down how much fluid you remove each time you empty your drain. Add up the amount removed in 24 hours.  Empty the bulb at the same time every day once the amount of fluid decreases and you only need to empty it once a day. Write down the amounts and the 24-hour totals to give to your health care provider. This helps your health care provider know when the tubes can be removed. EMPTYING THE BULB DRAIN Before emptying the bulb, get a measuring cup, a piece of paper, and a pen and wash your hands.  Gently run your fingers down the tube (stripping) to empty any drainage from the  tubing into the bulb. This may need to be done several times a day to clear the tubing of clots and tissue.  Open the bulb cap to release suction, which causes it to inflate. Do not touch the inside of the cap.  Gently run your fingers down the tube (stripping) to empty any drainage from the tubing into the bulb.  Hold the cap out of the way, and pour fluid into the measuring cup.   Squeeze the bulb to provide suction.  Replace the cap.   Check the tape that holds the tube to your skin. If it is becoming loose, you can remove the loose piece of tape and apply a new one. Then, pin the bulb to your shirt.   Write down the amount of fluid you emptied out. Write down the date and each time you emptied your bulb drain. (If there are 2 bulbs, note the amount of drainage from each bulb and keep the totals separate. Your health care provider will want to know the total amounts for each drain and which tube is draining more.)   Flush the fluid down the toilet and wash your hands.   Call your health care provider once you have less than 2 tbsp of fluid collecting in the bulb drain every  24 hours. If there is drainage around the tube site, change dressings and keep the area dry. Cleanse around tube with sterile saline and place dry gauze around site. This gauze should be changed when it is soiled. If it stays clean and unsoiled, it should still be changed daily.  SEEK MEDICAL CARE IF:  Your drainage has a bad smell or is cloudy.   You have a fever.   Your drainage is increasing instead of decreasing.   Your tube fell out.   You have redness or swelling around the tube site.   You have drainage from a surgical wound.   Your bulb drain will not stay flat after you empty it.  MAKE SURE YOU:   Understand these instructions.  Will watch your condition.  Will get help right away if you are not doing well or get worse. Document Released: 09/10/2000 Document Revised: 07/04/2013  Document Reviewed: 02/16/2012 [facility] Patient Information 2015 [place], [place]. This information is not intended to replace advice given to you by your health care provider. Make sure you discuss any questions you have with your health care provider.

## 2014-04-08 ENCOUNTER — Telehealth (INDEPENDENT_AMBULATORY_CARE_PROVIDER_SITE_OTHER): Payer: Self-pay

## 2014-04-08 NOTE — Telephone Encounter (Signed)
LMOM giving f/u appt with Dr Johney [place] for 7/27 arrive at 3:45/4:15.

## 2014-04-10 ENCOUNTER — Ambulatory Visit
Admission: RE | Admit: 2014-04-10 | Discharge: 2014-04-10 | Disposition: A | Discharge status: Home / Self Care | Payer: 59 | Source: Ambulatory Visit | Attending: Internal Medicine | Admitting: Internal Medicine

## 2014-04-10 ENCOUNTER — Telehealth: Payer: Self-pay | Admitting: *Deleted

## 2014-04-10 DIAGNOSIS — R160 Hepatomegaly, not elsewhere classified: Secondary | ICD-10-CM

## 2014-04-10 MED ORDER — GADOBENATE DIMEGLUMINE 529 MG/ML IV SOLN
18.0000 mL | Freq: Once | INTRAVENOUS | Status: AC | PRN
  Administered 2014-04-10: 18 mL via INTRAVENOUS

## 2014-04-10 NOTE — Discharge Summary (Signed)
Physician Discharge Summary  Patient ID: Jeanette Rose MRN: [id number] DOB/AGE: [birth year] 58 y.o.  Admit date: 04/02/2014 Discharge date: 04/10/2014  Admission Diagnoses:  1. Acute cholecystitis without cholelithiasis  2. AODM ID  3. Hypertension  4. Depression/anxiety  5. GERD/Hiatal hernia  6. BMI 32   Discharge Diagnoses:  Principal Problem:   Acute acalculous cholecystitis s/p lap chole 04/03/2014 Active Problems:   Anxiety   Diabetes mellitus without complication   GERD (gastroesophageal reflux disease)   Hypertension   Obesity (BMI 30-39.9)   Liver mass, possible cyst   Steatohepatitis   PROCEDURES: LAPAROSCOPIC LYSIS OF ADHESIONS X 60 MIN, LAPAROSCOPIC CHOLECYSTECTOMY, 04/03/2014, Adin Hector, MD     Hospital Course: Pt reports she was at her baseline state till 03/25/14. She had a cheese potatoes from [facility] After that she developed pain RUQ going to her back on the right side, symptoms started about 4 PM. She developed nausea and vomiting, multiple episodes; this lasted all night. She had pain when she went to bed but felt better after getting up. She did not go to work until 6/30, but felt fine till 03/30/14. She had another Cheese potatoes from [facility] and her symptoms returned. She had pain, nausea, multiple episodes of vomiting both 7/4 and on thru 03/31/14. Nothing made her feel better, there was no position she could take that would make her feel better. She had ongoing nausea and vomiting. She saw her PCP on Monday 04/01/14 and was sent for an abdominal US. This showed: No gallstones or wall thickening visualized, measuring 2 mm. No sonographic Murphy sign noted. Gallbladder is distended. Measuring up to 6 cm in diameter. 4 x 2 x 2.8 cm hypoechoic mass right lobe of the liver. Liver parenchyma otherwise unremarkable. Pt has continued to have pain, ongoing nausea and vomiting. She was unable to eat and was sent home after study. She continues to have pain, nausea and  vomiting so she presented to the ED. Work up shows mildly elevated LFT's, Bilirubin 1.7, AST, and ALT just minimally elevated. WBC is 18.3 with left shift. CT scan shows an inflammatory process in the RUQ surrounding the duodenum and gallbladder. The duodenum is slightly thickened walls and primary duodenal abnormality with secondary gallbladder distension is a consideration although primary gallbladder abnormality not entirely excluded. There is no evidence of extravasation of ingested contrast or free intraperitoneal air.  She look and feels miserable, she still had pain, no nausea or vomiting right now. We admitted her from the ED, but were not sure if this was duodenitis or a case of cholecystitis with cholelithiasis.  We scheduled her for a HIDA scan the following AM which was positive.  She was taken to the OR later that afternoon.   Findings:  Dilated and large gallbladder with gallbladder wall thickening. Some early empyema of the gallbladder. Cholangiogram done not done due to short cystic duct and consistent inflammation.  Because of the findings she was kept on IV antibiotics.  She had a good deal of pain and post op urinary retention after her procedure which prolonged her post op course.  By 04/07/14 she was voiding and her pain was much improved and it was Dr. Tommie Raymond opinion the patient was ready for discharge.  She was kept on IV antibiotics till discharge.   Her drain was left in place at d/c and she will follow up in the office.  Condition on d/c:  Improved   Disposition: 01-Home or Self Care  Discharge Instructions  Discharge patient    Complete by:  As directed             Medication List         acetaminophen 500 MG tablet  Commonly known as:  TYLENOL  Take 1,000 mg by mouth every 6 (six) hours as needed for moderate pain.     cetirizine 10 MG tablet  Commonly known as:  ZYRTEC  Take 10 mg by mouth daily.     DULoxetine 60 MG capsule  Commonly known as:   CYMBALTA  Take 120 mg by mouth daily.     fluticasone 50 MCG/ACT nasal spray  Commonly known as:  FLONASE  Place 2 sprays into the nose daily as needed for allergies.     glyBURIDE-metformin 1.25-250 MG per tablet  Commonly known as:  GLUCOVANCE  Take 1 tablet by mouth daily with breakfast.     hydrochlorothiazide 25 MG tablet  Commonly known as:  HYDRODIURIL  Take 25 mg by mouth daily.     hydrochlorothiazide 25 MG tablet  Commonly known as:  HYDRODIURIL  Take 25 mg by mouth daily.     insulin glargine 100 UNIT/ML injection  Commonly known as:  LANTUS  Inject 21 Units into the skin daily.     LORazepam 1 MG tablet  Commonly known as:  ATIVAN  Take 1 mg by mouth every 8 (eight) hours as needed for anxiety.     omeprazole 20 MG capsule  Commonly known as:  PRILOSEC  Take 20 mg by mouth daily.     oxyCODONE 5 MG immediate release tablet  Commonly known as:  Oxy IR/ROXICODONE  Take 1-2 tablets (5-10 mg total) by mouth every 4 (four) hours as needed for moderate pain.     simvastatin 10 MG tablet  Commonly known as:  ZOCOR  Take 10 mg by mouth daily.     valsartan 80 MG tablet  Commonly known as:  DIOVAN  Take 80 mg by mouth daily.     VITAMIN B-12 CR PO  Take 1 tablet by mouth daily.     VITAMIN D (CHOLECALCIFEROL) PO  Take 1 capsule by mouth daily.           Follow-up Information   Follow up with GROSS,STEVEN C., MD. Schedule an appointment as soon as possible for a visit in 1 week.   Specialty:  General Surgery   Contact information:   [address] [place] [address] [phone]       Signed: Earnstine Regal 04/10/2014, 2:22 PM

## 2014-04-12 NOTE — Telephone Encounter (Signed)
Entered in error

## 2014-04-18 ENCOUNTER — Other Ambulatory Visit: Payer: Self-pay | Admitting: Internal Medicine

## 2014-04-18 ENCOUNTER — Other Ambulatory Visit (HOSPITAL_COMMUNITY)
Admission: RE | Admit: 2014-04-18 | Discharge: 2014-04-18 | Disposition: A | Discharge status: Home / Self Care | Payer: 59 | Source: Ambulatory Visit | Attending: Internal Medicine | Admitting: Internal Medicine

## 2014-04-18 DIAGNOSIS — Z113 Encounter for screening for infections with a predominantly sexual mode of transmission: Secondary | ICD-10-CM | POA: Insufficient documentation

## 2014-04-18 DIAGNOSIS — Z01419 Encounter for gynecological examination (general) (routine) without abnormal findings: Secondary | ICD-10-CM | POA: Insufficient documentation

## 2014-04-18 DIAGNOSIS — Z1151 Encounter for screening for human papillomavirus (HPV): Secondary | ICD-10-CM | POA: Insufficient documentation

## 2014-04-19 ENCOUNTER — Ambulatory Visit (INDEPENDENT_AMBULATORY_CARE_PROVIDER_SITE_OTHER): Payer: Self-pay | Admitting: General Surgery

## 2014-04-19 LAB — CYTOLOGY - PAP

## 2014-04-22 ENCOUNTER — Encounter (INDEPENDENT_AMBULATORY_CARE_PROVIDER_SITE_OTHER): Payer: Self-pay | Admitting: Surgery

## 2014-04-22 ENCOUNTER — Encounter (INDEPENDENT_AMBULATORY_CARE_PROVIDER_SITE_OTHER): Payer: Self-pay

## 2014-04-22 ENCOUNTER — Ambulatory Visit (INDEPENDENT_AMBULATORY_CARE_PROVIDER_SITE_OTHER): Payer: 59 | Admitting: Surgery

## 2014-04-22 VITALS — BP 126/72 | HR 110 | Temp 98.0°F | Ht 64.0 in | Wt 188.0 lb

## 2014-04-22 DIAGNOSIS — K769 Liver disease, unspecified: Secondary | ICD-10-CM

## 2014-04-22 DIAGNOSIS — K810 Acute cholecystitis: Secondary | ICD-10-CM

## 2014-04-22 DIAGNOSIS — K7581 Nonalcoholic steatohepatitis (NASH): Secondary | ICD-10-CM

## 2014-04-22 DIAGNOSIS — R160 Hepatomegaly, not elsewhere classified: Secondary | ICD-10-CM

## 2014-04-22 DIAGNOSIS — K7689 Other specified diseases of liver: Secondary | ICD-10-CM

## 2014-04-22 DIAGNOSIS — K5900 Constipation, unspecified: Secondary | ICD-10-CM

## 2014-04-22 MED ORDER — OXYCODONE HCL 5 MG PO TABS: 5.0000 mg | ORAL_TABLET | Freq: Four times a day (QID) | ORAL | Status: DC | PRN

## 2014-04-22 NOTE — Progress Notes (Signed)
Subjective:     Patient ID: Jeanette Rose, female   DOB: [birth year], 58 y.o.   MRN: [id number]  HPI  Note: Portions of this report may have been transcribed using voice recognition software. Every effort was made to ensure accuracy; however, inadvertent computerized transcription errors may be present.   Any transcriptional errors that result from this process are unintentional.       Jeanette Rose  [birth year] [id number]  Patient Care Team: Kandice Hams, MD as PCP - General (Internal Medicine) Cloyd Stagers, MD as Consulting Physician (Internal Medicine) Darlin Coco, MD as Consulting Physician (Cardiology)  Procedure (Date: 04/03/2014):  POST-OPERATIVE DIAGNOSIS:  Acute cholecystitis w/o cholelithiasis  PROCEDURE: Procedure(s):  LAPAROSCOPIC LYSIS OF ADHESIONS X 60 MIN  LAPAROSCOPIC CHOLECYSTECTOMY  SURGEON: Surgeon(s):  Adin Hector, MD  ASSISTANT: RN    This patient returns for surgical re-evaluation.  She is slowly getting better.  She has had persistent soreness in the right upper quadrant.  Often at the drain site.  Drain output has been 8-10 mL a day.  Rather thin and yellow.  Eating okay.  Did have loose stools.  Still using narcotics.  Now constipated.  No bowel movement in 5 days.  No fevers or chills.  Walking.  Hoping to get back to work but does not feel she is close to ready yet.  Down to <10 oxycodone.  Patient Active Problem List   Diagnosis Date Noted  . Liver mass, possible cyst 04/03/2014  . Steatohepatitis 04/03/2014  . Anxiety   . Diabetes mellitus without complication   . GERD (gastroesophageal reflux disease)   . Hypertension   . Obesity (BMI 30-39.9)   . Acute acalculous cholecystitis s/p lap chole 04/03/2014 04/02/2014    Past Medical History  Diagnosis Date  . Allergy   . Anxiety   . Anemia   . Depression   . Diabetes mellitus without complication   . GERD (gastroesophageal reflux disease)   . Hypertension   . Obesity  (BMI 30-39.9)     Past Surgical History  Procedure Laterality Date  . Cholecystectomy N/A 04/03/2014    Procedure: LAPAROSCOPIC CHOLECYSTECTOMY ;  Surgeon: Adin Hector, MD;  Location: [facility];  Service: General;  Laterality: N/A;    History   Social History  . Marital Status: Divorced    Spouse Name: N/A    Number of Children: N/A  . Years of Education: N/A   Occupational History  . Not on file.   Social History Main Topics  . Smoking status: Former Smoker    Types: Cigarettes    Quit date: 04/23/2003  . Smokeless tobacco: Never Used  . Alcohol Use: No  . Drug Use: No  . Sexual Activity: No   Other Topics Concern  . Not on file   Social History Narrative  . No narrative on file    Family History  Problem Relation Age of Onset  . Heart disease Father     Current Outpatient Prescriptions  Medication Sig Dispense Refill  . acetaminophen (TYLENOL) 500 MG tablet Take 1,000 mg by mouth every 6 (six) hours as needed for moderate pain.      . cetirizine (ZYRTEC) 10 MG tablet Take 10 mg by mouth daily.      . Cyanocobalamin (VITAMIN B-12 CR PO) Take 1 tablet by mouth daily.      . DULoxetine (CYMBALTA) 60 MG capsule Take 120 mg by mouth daily.      . fluticasone (  FLONASE) 50 MCG/ACT nasal spray Place 2 sprays into the nose daily as needed for allergies.       Marland Kitchen glyBURIDE-metformin (GLUCOVANCE) 1.25-250 MG per tablet Take 1 tablet by mouth daily with breakfast.      . hydrochlorothiazide (HYDRODIURIL) 25 MG tablet Take 25 mg by mouth daily.      . hydrochlorothiazide (HYDRODIURIL) 25 MG tablet Take 25 mg by mouth daily.      . insulin glargine (LANTUS) 100 UNIT/ML injection Inject 21 Units into the skin daily.       Marland Kitchen LORazepam (ATIVAN) 1 MG tablet Take 1 mg by mouth every 8 (eight) hours as needed for anxiety.       Marland Kitchen omeprazole (PRILOSEC) 20 MG capsule Take 20 mg by mouth daily.      Marland Kitchen oxyCODONE (OXY IR/ROXICODONE) 5 MG immediate release tablet Take 1-2 tablets (5-10 mg  total) by mouth every 4 (four) hours as needed for moderate pain.  40 tablet  0  . simvastatin (ZOCOR) 10 MG tablet Take 10 mg by mouth daily.      . valsartan (DIOVAN) 80 MG tablet Take 80 mg by mouth daily.      Marland Kitchen VITAMIN D, CHOLECALCIFEROL, PO Take 1 capsule by mouth daily.       No current facility-administered medications for this visit.     No Known Allergies  BP 126/72  Pulse 110  Temp(Src) 98 F (36.7 C)  Ht 5' 4"  (1.626 m)  Wt 188 lb (85.276 kg)  BMI 32.25 kg/m2  Mr Abdomen W Wo Contrast  04/10/2014   CLINICAL DATA:  Indeterminate liver mass seen on recent CT. 1 week postop from cholecystectomy for acute cholecystitis.  EXAM: MRI ABDOMEN WITHOUT AND WITH CONTRAST  TECHNIQUE: Multiplanar multisequence MR imaging of the abdomen was performed both before and after the administration of intravenous contrast.  CONTRAST:  38m MULTIHANCE GADOBENATE DIMEGLUMINE 529 MG/ML IV SOLN  COMPARISON:  CT on 04/02/2014  FINDINGS: A benign appearing cyst is seen in the medial segment left hepatic lobe which measures 2.7 cm, and corresponds to the indeterminate lesion seen on recent CT. No complex cystic or solid liver masses are identified.  A small postop fluid collection is seen in the gallbladder fossa which measures 2.5 x 4.3 cm. There is no evidence of biliary ductal dilatation.  The pancreas, spleen, adrenal glands, and kidneys are normal in appearance. No evidence hydronephrosis. No abdominal soft tissue masses or lymphadenopathy identified.  IMPRESSION: 2.7 cm benign cyst in the medial segment of the left hepatic lobe, corresponding to indeterminate lesion seen on recent CT.  Small postop fluid collection in the gallbladder fossa measuring 2.5 x 4.3 cm.  No evidence of biliary ductal dilatation.   Electronically Signed   By: JEarle GellM.D.   On: 04/10/2014 18:01   Nm Hepatobiliary  04/03/2014   CLINICAL DATA:  Gallbladder distention and inflammation on CT. No gallstones evident on ultrasound.   EXAM: NUCLEAR MEDICINE HEPATOBILIARY IMAGING  TECHNIQUE: Sequential images of the abdomen were obtained out to 90 minutes following intravenous administration of radiopharmaceutical. 1.7 mg of morphine was administered at 90 min.  RADIOPHARMACEUTICALS:  5.5 [employer] [id number]  COMPARISON:  CT 04/03/1999 50  FINDINGS: There is homogeneous uptake of radiotracer within the liver. Counts are evident within the small bowel by 10 min. The gallbladder was not visualized at 90 min therefore an ejection fraction could not calculated. Morphine was administered to to visualization gallbladder. The gallbladder was not  visualized up to 60 min followed morphine injection.  IMPRESSION: 1. Nonvisualization gallbladder following morphine injection is consistent with cystic duct obstruction. Findings suggest acalculous cholecystitis. 2. Patent common bile duct. 3.  Findings conveyed toSteve Grosson 04/03/2014  at14:23.   Electronically Signed   By: Suzy Bouchard M.D.   On: 04/03/2014 14:27   US Abdomen Complete  04/01/2014   CLINICAL DATA:  Abdominal tenderness, right upper quadrant  EXAM: ULTRASOUND ABDOMEN COMPLETE  COMPARISON:  None.  FINDINGS: Gallbladder:  No gallstones or wall thickening visualized, measuring 2 mm. No sonographic Murphy sign noted. Gallbladder is distended. Measuring up to 6 cm in diameter.  Common bile duct:  Diameter: 3 mm  Liver:  Indeterminate 4 x 2 x 2.8 cm hypoechoic mass right lobe of the liver. Liver parenchyma otherwise unremarkable.  IVC:  No abnormality visualized.  Pancreas:  Visualized portion unremarkable.  Spleen:  Size and appearance within normal limits.  Right Kidney:  Length: 11.1 cm. Echogenicity within normal limits. No mass or hydronephrosis visualized.  Left Kidney:  Length: 13.5 cm. Echogenicity within normal limits. No mass or hydronephrosis visualized.  Abdominal aorta:  No aneurysm visualized.  Other findings:  None.  IMPRESSION: Indeterminate hypoechoic mass within the  right lobe of the liver. Further evaluation with liver MRI recommended. These results will be called to the ordering clinician or representative by the Radiologist Assistant, and communication documented in the PACS or zVision Dashboard.  Distended gallbladder correlation with liver function tests recommended. Otherwise no sonographic evidence of cholecystitis.   Electronically Signed   By: Margaree Mackintosh M.D.   On: 04/01/2014 12:00   Ct Abdomen Pelvis W Contrast  04/02/2014   CLINICAL DATA:  Right upper quadrant pain.  EXAM: CT ABDOMEN AND PELVIS WITH CONTRAST  TECHNIQUE: Multidetector CT imaging of the abdomen and pelvis was performed using the standard protocol following bolus administration of intravenous contrast.  CONTRAST:  16m OMNIPAQUE IOHEXOL 300 MG/ML SOLN, 1034mOMNIPAQUE IOHEXOL 300 MG/ML SOLN  COMPARISON:  None.  FINDINGS: Abnormal inflammatory process right upper quadrant. This surrounds the duodenum and gallbladder. On the recent ultrasound, the distended gallbladder did not have thickened wall as may be expected if findings were related to primary cholecystitis. The duodenum itself has slightly thickened walls and primary duodenal abnormality with secondary gallbladder distension is a consideration although primary gallbladder abnormality not entirely excluded. There is no evidence of extravasation of ingested contrast or free intraperitoneal air. The pancreas does not appear to be the primary inflammatory process. No calcified gallstones along the common bile duct. No intrahepatic biliary duct dilation.  Adenopathy peripancreatic region most likely related to the upper abdominal inflammatory process.  The small amount of fluid within the right abdomen extends towards the appendix which is markedly attenuated in caliber. Primary appendix process as a cause for the above described findings felt unlikely.  Hiatal hernia with distal esophageal wall thickening may be related to under distension or  reflux. No definitive mass at this level is noted.  Within the anterior aspect of the medial segment of the left lobe of the liver, there is a 2.8 cm low-density structure which cannot be confirmed as a simple cyst on the present exam. This does not demonstrate delayed contrast enhanced imaging as may be seen with a hemangioma. Exact etiology indeterminate. This can be assessed on elective contrast-enhanced liver MR after the patient's acute episode has cleared.  No worrisome splenic, renal or adrenal abnormality.  No abdominal aortic aneurysm. Mild atherosclerotic type  changes of the lower abdominal aorta our.  Noncontrast filled decompressed views of the urinary bladder without Jabree Rebert abnormality. Prior tubal ligation.  No bony destructive process. Mild hip joint degenerative changes noted bilaterally.  Minimal atelectatic changes lung bases.  IMPRESSION: Abnormal inflammatory process right upper quadrant. This surrounds the duodenum and gallbladder. On the recent ultrasound, the distended gallbladder did not have thickened wall as may be expected if findings were related to primary cholecystitis. The duodenum itself has slightly thickened walls and primary duodenal abnormality with secondary gallbladder distension is a consideration although primary gallbladder abnormality not entirely excluded. There is no evidence of extravasation of ingested contrast or free intraperitoneal air.  Adenopathy peripancreatic region most likely related to the upper abdominal inflammatory process.  Hiatal hernia with distal esophageal wall thickening may be related to under distension or reflux. No definitive mass at this level is noted.  2.8 cm low-density structure within the anterior aspect of the medial segment of the left lobe of the liver, cannot be confirmed as a simple cyst or hemangioma. Exact etiology indeterminate. This can be assessed on elective contrast-enhanced liver MR after the patient's acute episode has cleared.   These results were called by telephone at the time of interpretation on 04/02/2014 at 1:53 PM to Dr. Pryor Curia , who verbally acknowledged these results.   Electronically Signed   By: Chauncey Cruel M.D.   On: 04/02/2014 14:03     Review of Systems  Constitutional: Negative for fever, chills and diaphoresis.  HENT: Negative for ear pain, sore throat and trouble swallowing.   Eyes: Negative for photophobia and visual disturbance.  Respiratory: Negative for cough and choking.   Cardiovascular: Negative for chest pain and palpitations.  Gastrointestinal: Negative for nausea, vomiting, abdominal pain, diarrhea, constipation, anal bleeding and rectal pain.  Genitourinary: Negative for dysuria, frequency and difficulty urinating.  Musculoskeletal: Negative for gait problem and myalgias.  Skin: Negative for color change, pallor and rash.  Neurological: Negative for dizziness, speech difficulty, weakness and numbness.  Hematological: Negative for adenopathy.  Psychiatric/Behavioral: Negative for confusion and agitation. The patient is not nervous/anxious.        Objective:   Physical Exam  Constitutional: She is oriented to person, place, and time. She appears well-developed and well-nourished. No distress.  HENT:  Head: Normocephalic.  Mouth/Throat: Oropharynx is clear and moist. No oropharyngeal exudate.  Eyes: Conjunctivae and EOM are normal. Pupils are equal, round, and reactive to light. No scleral icterus.  Neck: Normal range of motion. No tracheal deviation present.  Cardiovascular: Normal rate and intact distal pulses.   Pulmonary/Chest: Effort normal. No respiratory distress. She exhibits no tenderness.  Abdominal: Soft. She exhibits no distension. There is no tenderness. There is no rigidity, no rebound, no guarding, no tenderness at McBurney's point and negative Murphy's sign. Hernia confirmed negative in the right inguinal area and confirmed negative in the left inguinal area.     Incisions clean with normal healing ridges.  No hernias  Genitourinary: No vaginal discharge found.  Musculoskeletal: Normal range of motion. She exhibits no tenderness.  Lymphadenopathy:       Right: No inguinal adenopathy present.       Left: No inguinal adenopathy present.  Neurological: She is alert and oriented to person, place, and time. No cranial nerve deficit. She exhibits normal muscle tone. Coordination normal.  Skin: Skin is warm and dry. No rash noted. She is not diaphoretic.  Psychiatric: She has a normal mood and affect. Her behavior  is normal.       Assessment:     Slowly recovering status post emergent cholecystectomy for necrotic acute cholecystitis.     Plan:     Considering how bad her gallbladder was, I think she is doing okay.  I removed the drain.  Would hold off on return a week for at least one if not 2 weeks.  The relative for 2 weeks.  Increase activity as tolerated to regular activity.  Low impact exercise such as walking an hour a day at least ideal.  Do not push through pain.  Diet as tolerated.  Low fat high fiber diet ideal.  Bowel regimen with 30 g fiber a day and fiber supplement as needed to avoid problems.  Current constipation with laxative.  Get her bowels moving.  Herniated oxycodone one more time.  Continue heat and Tylenol.  Return to clinic in 3 weeks, sooner as needed.   If she does not improve or feels worse, consider lab work and x-ray studies to rule out biloma/leak/abscess.  I doubt that is what is going on at this time.  Instructions discussed.  Followup with primary care physician for other health issues as would normally be done.  Consider screening for malignancies (breast, prostate, colon, melanoma, etc) as appropriate.  Questions answered.  The patient expressed understanding and appreciation

## 2014-04-22 NOTE — Patient Instructions (Signed)
LAPAROSCOPIC SURGERY: POST OP INSTRUCTIONS  1. DIET: Follow a light bland diet the first 24 hours after arrival home, such as soup, liquids, crackers, etc.  Be sure to include lots of fluids daily.  Avoid fast food or heavy meals as your are more likely to get nauseated.  Eat a low fat the next few days after surgery.   2. Take your usually prescribed home medications unless otherwise directed. 3. PAIN CONTROL: a. Pain is best controlled by a usual combination of three different methods TOGETHER: i. Ice/Heat ii. Over the counter pain medication iii. Prescription pain medication b. Most patients will experience some swelling and bruising around the incisions.  Ice packs or heating pads (30-60 minutes up to 6 times a day) will help. Use ice for the first few days to help decrease swelling and bruising, then switch to heat to help relax tight/sore spots and speed recovery.  Some people prefer to use ice alone, heat alone, alternating between ice & heat.  Experiment to what works for you.  Swelling and bruising can take several weeks to resolve.   c. It is helpful to take an over-the-counter pain medication regularly for the first few weeks.  Choose one of the following that works best for you: i. Naproxen (Aleve, etc)  Two 236m tabs twice a day ii. Ibuprofen (Advil, etc) Three 2060mtabs four times a day (every meal & bedtime) iii. Acetaminophen (Tylenol, etc) 500-65025mour times a day (every meal & bedtime) d. A  prescription for pain medication (such as oxycodone, hydrocodone, etc) should be given to you upon discharge.  Take your pain medication as prescribed.  i. If you are having problems/concerns with the prescription medicine (does not control pain, nausea, vomiting, rash, itching, etc), please call us [phone] see if we need to switch you to a different pain medicine that will work better for you and/or control your side effect better. ii. If you need a refill on your pain medication,  please contact your pharmacy.  They will contact our office to request authorization. Prescriptions will not be filled after 5 pm or on week-ends. 4. Avoid getting constipated.  Between the surgery and the pain medications, it is common to experience some constipation.  Increasing fluid intake and taking a fiber supplement (such as Metamucil, Citrucel, FiberCon, MiraLax, etc) 1-2 times a day regularly will usually help prevent this problem from occurring.  A mild laxative (prune juice, Milk of Magnesia, MiraLax, etc) should be taken according to package directions if there are no bowel movements after 48 hours.   5. Watch out for diarrhea.  If you have many loose bowel movements, simplify your diet to bland foods & liquids for a few days.  Stop any stool softeners and decrease your fiber supplement.  Switching to mild anti-diarrheal medications (Kayopectate, Pepto Bismol) can help.  If this worsens or does not improve, please call us.[place]. Wash / shower every day.  You may shower over the dressings as they are waterproof.  Continue to shower over incision(s) after the dressing is off. 7. Remove your waterproof bandages 5 days after surgery.  You may leave the incision open to air.  You may replace a dressing/Band-Aid to cover the incision for comfort if you wish.  8. ACTIVITIES as tolerated:   a. You may resume regular (light) daily activities beginning the next day-such as daily self-care, walking, climbing stairs-gradually increasing activities as tolerated.  If you can walk 30 minutes without difficulty, it  is safe to try more intense activity such as jogging, treadmill, bicycling, low-impact aerobics, swimming, etc. b. Save the most intensive and strenuous activity for last such as sit-ups, heavy lifting, contact sports, etc  Refrain from any heavy lifting or straining until you are off narcotics for pain control.   c. DO NOT PUSH THROUGH PAIN.  Let pain be your guide: If it hurts to do something, don't do  it.  Pain is your body warning you to avoid that activity for another week until the pain goes down. d. You may drive when you are no longer taking prescription pain medication, you can comfortably wear a seatbelt, and you can safely maneuver your car and apply brakes. e. Dennis Bast may have sexual intercourse when it is comfortable.  9. FOLLOW UP in our office a. Please call [facility] at [phone] to set up an appointment to see your surgeon in the office for a follow-up appointment approximately 2-3 weeks after your surgery. b. Make sure that you call for this appointment the day you arrive home to insure a convenient appointment time. 10. IF YOU HAVE DISABILITY OR FAMILY LEAVE FORMS, BRING THEM TO THE OFFICE FOR PROCESSING.  DO NOT GIVE THEM TO YOUR DOCTOR.   WHEN TO CALL us [phone]: 1. Poor pain control 2. Reactions / problems with new medications (rash/itching, nausea, etc)  3. Fever over 101.5 F (38.5 C) 4. Inability to urinate 5. Nausea and/or vomiting 6. Worsening swelling or bruising 7. Continued bleeding from incision. 8. Increased pain, redness, or drainage from the incision   The clinic staff is available to answer your questions during regular business hours (8:30am-5pm).  Please don't hesitate to call and ask to speak to one of our nurses for clinical concerns.   If you have a medical emergency, go to the nearest emergency room or call 911.  A surgeon from [facility] is always on call at the [facility], [place], [place], [place], [place]  [address] ? MAIN: [phone] ? TOLL FREE: [phone] ?  FAX [phone] [email or web address]  GETTING TO GOOD BOWEL HEALTH. Irregular bowel habits such as constipation and diarrhea can lead to many problems over time.  Having one soft bowel movement a day is the most important way to prevent further problems.  The anorectal canal is designed to handle stretching  and feces to safely manage our ability to get rid of solid waste (feces, poop, stool) out of our body.  BUT, hard constipated stools can act like ripping concrete bricks and diarrhea can be a burning fire to this very sensitive area of our body, causing inflamed hemorrhoids, anal fissures, increasing risk is perirectal abscesses, abdominal pain/bloating, an making irritable bowel worse.     The goal: ONE SOFT BOWEL MOVEMENT A DAY!  To have soft, regular bowel movements:    Drink at least 8 tall glasses of water a day.     Take plenty of fiber.  Fiber is the undigested part of plant food that passes into the colon, acting s "natures broom" to encourage bowel motility and movement.  Fiber can absorb and hold large amounts of water. This results in a larger, bulkier stool, which is soft and easier to pass. Work gradually over several weeks up to 6 servings a day of fiber (25g a day even more if needed) in the form of: o Vegetables -- Root (potatoes, carrots, turnips), leafy green (lettuce, salad greens, celery,  spinach), or cooked high residue (cabbage, broccoli, etc) o Fruit -- Fresh (unpeeled skin & pulp), Dried (prunes, apricots, cherries, etc ),  or stewed ( applesauce)  o Whole grain breads, pasta, etc (whole wheat)  o Bran cereals    Bulking Agents -- This type of water-retaining fiber generally is easily obtained each day by one of the following:  o Psyllium bran -- The psyllium plant is remarkable because its ground seeds can retain so much water. This product is available as Metamucil, Konsyl, Effersyllium, Per Diem Fiber, or the less expensive generic preparation in drug and health food stores. Although labeled a laxative, it really is not a laxative.  o Methylcellulose -- This is another fiber derived from wood which also retains water. It is available as Citrucel. o Polyethylene Glycol - and "artificial" fiber commonly called Miralax or Glycolax.  It is helpful for people with gassy or bloated  feelings with regular fiber o Flax Seed - a less gassy fiber than psyllium   No reading or other relaxing activity while on the toilet. If bowel movements take longer than 5 minutes, you are too constipated   AVOID CONSTIPATION.  High fiber and water intake usually takes care of this.  Sometimes a laxative is needed to stimulate more frequent bowel movements, but    Laxatives are not a good long-term solution as it can wear the colon out. o Osmotics (Milk of Magnesia, Fleets phosphosoda, Magnesium citrate, MiraLax, GoLytely) are safer than  o Stimulants (Senokot, Castor Oil, Dulcolax, Ex Lax)    o Do not take laxatives for more than 7days in a row.    IF SEVERELY CONSTIPATED, try a Bowel Retraining Program: o Do not use laxatives.  o Eat a diet high in roughage, such as bran cereals and leafy vegetables.  o Drink six (6) ounces of prune or apricot juice each morning.  o Eat two (2) large servings of stewed fruit each day.  o Take one (1) heaping tablespoon of a psyllium-based bulking agent twice a day. Use sugar-free sweetener when possible to avoid excessive calories.  o Eat a normal breakfast.  o Set aside 15 minutes after breakfast to sit on the toilet, but do not strain to have a bowel movement.  o If you do not have a bowel movement by the third day, use an enema and repeat the above steps.    Controlling diarrhea o Switch to liquids and simpler foods for a few days to avoid stressing your intestines further. o Avoid dairy products (especially milk & ice cream) for a short time.  The intestines often can lose the ability to digest lactose when stressed. o Avoid foods that cause gassiness or bloating.  Typical foods include beans and other legumes, cabbage, broccoli, and dairy foods.  Every person has some sensitivity to other foods, so listen to our body and avoid those foods that trigger problems for you. o Adding fiber (Citrucel, Metamucil, psyllium, Miralax) gradually can help thicken  stools by absorbing excess fluid and retrain the intestines to act more normally.  Slowly increase the dose over a few weeks.  Too much fiber too soon can backfire and cause cramping & bloating. o Probiotics (such as active yogurt, Align, etc) may help repopulate the intestines and colon with normal bacteria and calm down a sensitive digestive tract.  Most studies show it to be of mild help, though, and such products can be costly. o Medicines:   Bismuth subsalicylate (ex. Kayopectate, [place]) every  30 minutes for up to 6 doses can help control diarrhea.  Avoid if pregnant.   Loperamide (Immodium) can slow down diarrhea.  Start with two tablets (62m total) first and then try one tablet every 6 hours.  Avoid if you are having fevers or severe pain.  If you are not better or start feeling worse, stop all medicines and call your doctor for advice o Call your doctor if you are getting worse or not better.  Sometimes further testing (cultures, endoscopy, X-ray studies, bloodwork, etc) may be needed to help diagnose and treat the cause of the diarrhea.  Managing Pain  Pain after surgery or related to activity is often due to strain/injury to muscle, tendon, nerves and/or incisions.  This pain is usually short-term and will improve in a few months.   Many people find it helpful to do the following things TOGETHER to help speed the process of healing and to get back to regular activity more quickly:  1. Avoid heavy physical activity a.  no lifting greater than 20 pounds b. Do not "push through" the pain.  Listen to your body and avoid positions and maneuvers than reproduce the pain c. Walking is okay as tolerated, but go slowly and stop when getting sore.  d. Remember: If it hurts to do it, then don't do it! 2. Take Anti-inflammatory medication  a. Take with food/snack around the clock for 1-2 weeks i. This helps the muscle and nerve tissues become less irritable and calm down faster ii. Choose  Acetaminophen 5045mtabs (Tylenol) 1-2 pills with every meal and just before bedtime 3. Use a Heating pad or Ice/Cold Pack a. 4-6 times a day b. May use warm bath/hottub  or showers 4. Try Gentle Massage and/or Stretching  a. at the area of pain many times a day b. stop if you feel pain - do not overdo it  Try these steps together to help you body heal faster and avoid making things get worse.  Doing just one of these things may not be enough.    If you are not getting better after two weeks or are noticing you are getting worse, contact our office for further advice; we may need to re-evaluate you & see what other things we can do to help.

## 2014-04-25 ENCOUNTER — Telehealth (INDEPENDENT_AMBULATORY_CARE_PROVIDER_SITE_OTHER): Payer: Self-pay

## 2014-04-25 DIAGNOSIS — Z9049 Acquired absence of other specified parts of digestive tract: Secondary | ICD-10-CM

## 2014-04-25 NOTE — Telephone Encounter (Signed)
Patient calling into office to report that she's having green drainage and her skin has a green color around drain site incision.  Patient denies having any fevers, nausea, vomiting or RUQ pain.  Patient reports mild LUQ discomfort.  Patient reports she's having bowel movements.  Patient reports mild abdominal bloating.  Patient states she's had chills and her PCP informed her of TSH level being low and that may be the cause of her having chills.  Patient status post laparoscopic cholecystectomy on 04/03/14.  Alisha reviewed with Dr. Johney [place], patient to have CBC & CMET STAT tomorrow morning.  Appointment time held on Urgent Office at 4:00pm with Dr. Hassell Done.  Patient aware we will call with results and advise if she needs to be seen in the office tomorrow.  Lab orders entered in EPIC.

## 2014-04-26 ENCOUNTER — Encounter (INDEPENDENT_AMBULATORY_CARE_PROVIDER_SITE_OTHER): Payer: 59 | Admitting: Surgery

## 2014-04-26 LAB — CBC WITH DIFFERENTIAL/PLATELET
HCT: 38.6 % (ref 36.0–46.0)
Hemoglobin: 13.1 g/dL (ref 12.0–15.0)
LYMPHS ABS: 2.9 10*3/uL (ref 0.7–4.0)
Lymphocytes Relative: 37 % (ref 12–46)
MCH: 31.8 pg (ref 26.0–34.0)
MCHC: 34 g/dL (ref 30.0–36.0)
MCV: 93.4 fL (ref 78.0–100.0)
Monocytes Absolute: 0.4 10*3/uL (ref 0.1–1.0)
Monocytes Relative: 5 % (ref 3–12)
Neutro Abs: 4.5 10*3/uL (ref 1.7–7.7)
Neutrophils Relative %: 58 % (ref 43–77)
PLATELETS: 270 10*3/uL (ref 150–400)
RBC: 4.13 MIL/uL (ref 3.87–5.11)
RDW: 13.3 % (ref 11.5–15.5)
WBC: 7.7 10*3/uL (ref 4.0–10.5)

## 2014-04-26 LAB — COMPREHENSIVE METABOLIC PANEL
ALK PHOS: 76 U/L (ref 39–117)
ALT: 49 U/L — ABNORMAL HIGH (ref 0–35)
AST: 37 U/L (ref 0–37)
Albumin: 4.1 g/dL (ref 3.5–5.2)
BILIRUBIN TOTAL: 0.5 mg/dL (ref 0.2–1.2)
BUN: 7 mg/dL (ref 6–23)
CO2: 28 mEq/L (ref 19–32)
Calcium: 10.1 mg/dL (ref 8.4–10.5)
Chloride: 103 mEq/L (ref 96–112)
Creat: 0.7 mg/dL (ref 0.50–1.10)
Glucose, Bld: 151 mg/dL — ABNORMAL HIGH (ref 70–99)
Potassium: 3.7 mEq/L (ref 3.5–5.3)
Sodium: 145 mEq/L (ref 135–145)
Total Protein: 7.5 g/dL (ref 6.0–8.3)

## 2014-04-26 NOTE — Telephone Encounter (Signed)
Called pt to notify her the labs are normal. I advised pt that if she is feeling ok with the labs being normal and the drainage is not worse than we could cancel the appt in urge today. The pt stated that there is no more green drainage and she is ok with canceling the appt. I advised pt that if anything changes over the weekend with fevers,chills,or odor w/drainage she needs to call the office. I ask for the pt to touch base with me on Monday to see how she did over the weekend b/c if pt needs to see Dr Johney [place] next week then I could make her appt. Pt agrees with this plan.

## 2014-05-03 ENCOUNTER — Encounter (INDEPENDENT_AMBULATORY_CARE_PROVIDER_SITE_OTHER): Payer: Self-pay

## 2014-05-14 ENCOUNTER — Ambulatory Visit (INDEPENDENT_AMBULATORY_CARE_PROVIDER_SITE_OTHER): Payer: 59 | Admitting: Surgery

## 2014-05-14 ENCOUNTER — Encounter (INDEPENDENT_AMBULATORY_CARE_PROVIDER_SITE_OTHER): Payer: Self-pay | Admitting: Surgery

## 2014-05-14 VITALS — BP 126/70 | HR 90 | Temp 97.4°F | Ht 64.0 in | Wt 187.0 lb

## 2014-05-14 DIAGNOSIS — K810 Acute cholecystitis: Secondary | ICD-10-CM

## 2014-05-14 DIAGNOSIS — K7689 Other specified diseases of liver: Secondary | ICD-10-CM

## 2014-05-14 DIAGNOSIS — K5900 Constipation, unspecified: Secondary | ICD-10-CM

## 2014-05-14 DIAGNOSIS — K7581 Nonalcoholic steatohepatitis (NASH): Secondary | ICD-10-CM

## 2014-05-14 NOTE — Patient Instructions (Signed)
GETTING TO GOOD BOWEL HEALTH. Irregular bowel habits such as constipation and diarrhea can lead to many problems over time.  Having one soft bowel movement a day is the most important way to prevent further problems.  The anorectal canal is designed to handle stretching and feces to safely manage our ability to get rid of solid waste (feces, poop, stool) out of our body.  BUT, hard constipated stools can act like ripping concrete bricks and diarrhea can be a burning fire to this very sensitive area of our body, causing inflamed hemorrhoids, anal fissures, increasing risk is perirectal abscesses, abdominal pain/bloating, an making irritable bowel worse.     The goal: ONE SOFT BOWEL MOVEMENT A DAY!  To have soft, regular bowel movements:    Drink at least 8 tall glasses of water a day.     Take plenty of fiber.  Fiber is the undigested part of plant food that passes into the colon, acting s "natures broom" to encourage bowel motility and movement.  Fiber can absorb and hold large amounts of water. This results in a larger, bulkier stool, which is soft and easier to pass. Work gradually over several weeks up to 6 servings a day of fiber (25g a day even more if needed) in the form of: o Vegetables -- Root (potatoes, carrots, turnips), leafy green (lettuce, salad greens, celery, spinach), or cooked high residue (cabbage, broccoli, etc) o Fruit -- Fresh (unpeeled skin & pulp), Dried (prunes, apricots, cherries, etc ),  or stewed ( applesauce)  o Whole grain breads, pasta, etc (whole wheat)  o Bran cereals    Bulking Agents -- This type of water-retaining fiber generally is easily obtained each day by one of the following:  o Psyllium bran -- The psyllium plant is remarkable because its ground seeds can retain so much water. This product is available as Metamucil, Konsyl, Effersyllium, Per Diem Fiber, or the less expensive generic preparation in drug and health food stores. Although labeled a laxative, it really  is not a laxative.  o Methylcellulose -- This is another fiber derived from wood which also retains water. It is available as Citrucel. o Polyethylene Glycol - and "artificial" fiber commonly called Miralax or Glycolax.  It is helpful for people with gassy or bloated feelings with regular fiber o Flax Seed - a less gassy fiber than psyllium   No reading or other relaxing activity while on the toilet. If bowel movements take longer than 5 minutes, you are too constipated   AVOID CONSTIPATION.  High fiber and water intake usually takes care of this.  Sometimes a laxative is needed to stimulate more frequent bowel movements, but    Laxatives are not a good long-term solution as it can wear the colon out. o Osmotics (Milk of Magnesia, Fleets phosphosoda, Magnesium citrate, MiraLax, GoLytely) are safer than  o Stimulants (Senokot, Castor Oil, Dulcolax, Ex Lax)    o Do not take laxatives for more than 7days in a row.    IF SEVERELY CONSTIPATED, try a Bowel Retraining Program: o Do not use laxatives.  o Eat a diet high in roughage, such as bran cereals and leafy vegetables.  o Drink six (6) ounces of prune or apricot juice each morning.  o Eat two (2) large servings of stewed fruit each day.  o Take one (1) heaping tablespoon of a psyllium-based bulking agent twice a day. Use sugar-free sweetener when possible to avoid excessive calories.  o Eat a normal breakfast.  o  Set aside 15 minutes after breakfast to sit on the toilet, but do not strain to have a bowel movement.  o If you do not have a bowel movement by the third day, use an enema and repeat the above steps.    Controlling diarrhea o Switch to liquids and simpler foods for a few days to avoid stressing your intestines further. o Avoid dairy products (especially milk & ice cream) for a short time.  The intestines often can lose the ability to digest lactose when stressed. o Avoid foods that cause gassiness or bloating.  Typical foods include  beans and other legumes, cabbage, broccoli, and dairy foods.  Every person has some sensitivity to other foods, so listen to our body and avoid those foods that trigger problems for you. o Adding fiber (Citrucel, Metamucil, psyllium, Miralax) gradually can help thicken stools by absorbing excess fluid and retrain the intestines to act more normally.  Slowly increase the dose over a few weeks.  Too much fiber too soon can backfire and cause cramping & bloating. o Probiotics (such as active yogurt, Align, etc) may help repopulate the intestines and colon with normal bacteria and calm down a sensitive digestive tract.  Most studies show it to be of mild help, though, and such products can be costly. o Medicines:   Bismuth subsalicylate (ex. Kayopectate, Pepto Bismol) every 30 minutes for up to 6 doses can help control diarrhea.  Avoid if pregnant.   Loperamide (Immodium) can slow down diarrhea.  Start with two tablets (88m total) first and then try one tablet every 6 hours.  Avoid if you are having fevers or severe pain.  If you are not better or start feeling worse, stop all medicines and call your doctor for advice o Call your doctor if you are getting worse or not better.  Sometimes further testing (cultures, endoscopy, X-ray studies, bloodwork, etc) may be needed to help diagnose and treat the cause of the diarrhea.  Cholecystitis Cholecystitis is an inflammation of your gallbladder. It is usually caused by a buildup of gallstones or sludge (cholelithiasis) in your gallbladder. The gallbladder stores a fluid that helps digest fats (bile). Cholecystitis is serious and needs treatment right away.  CAUSES   Gallstones. Gallstones can block the tube that leads to your gallbladder, causing bile to build up. As bile builds up, the gallbladder becomes inflamed.  Bile duct problems, such as blockage from scarring or kinking.  Tumors. Tumors can stop bile from leaving your gallbladder correctly, causing bile  to build up. As bile builds up, the gallbladder becomes inflamed. SYMPTOMS   Nausea.  Vomiting.  Abdominal pain, especially in the upper right area of your abdomen.  Abdominal tenderness or bloating.  Sweating.  Chills.  Fever.  Yellowing of the skin and the whites of the eyes (jaundice). DIAGNOSIS  Your caregiver may order blood tests to look for infection or gallbladder problems. Your caregiver may also order imaging tests, such as an ultrasound or computed tomography (CT) scan. Further tests may include a hepatobiliary iminodiacetic acid (HIDA) scan. This scan allows your caregiver to see your bile move from the liver to the gallbladder and to the small intestine. TREATMENT  A hospital stay is usually necessary to lessen the inflammation of your gallbladder. You may be required to not eat or drink (fast) for a certain amount of time. You may be given medicine to treat pain or an antibiotic medicine to treat an infection. Surgery may be needed to remove your  gallbladder (cholecystectomy) once the inflammation has gone down. Surgery may be needed right away if you develop complications such as death of gallbladder tissue (gangrene) or a tear (perforation) of the gallbladder.  [place] care will depend on your treatment. In general:  If you were given antibiotics, take them as directed. Finish them even if you start to feel better.  Only take over-the-counter or prescription medicines for pain, discomfort, or fever as directed by your caregiver.  Follow a low-fat diet until you see your caregiver again.  Keep all follow-up visits as directed by your caregiver. SEEK IMMEDIATE MEDICAL CARE IF:   Your pain is increasing and not controlled by medicines.  Your pain moves to another part of your abdomen or to your back.  You have a fever.  You have nausea and vomiting. MAKE SURE YOU:  Understand these instructions.  Will watch your condition.  Will get  help right away if you are not doing well or get worse. Document Released: 09/13/2005 Document Revised: 12/06/2011 Document Reviewed: 07/30/2011 [facility] Patient Information 2015 [place], [place]. This information is not intended to replace advice given to you by your health care provider. Make sure you discuss any questions you have with your health care provider.  Exercise to Stay Healthy Exercise helps you become and stay healthy. EXERCISE IDEAS AND TIPS Choose exercises that:  You enjoy.  Fit into your day. You do not need to exercise really hard to be healthy. You can do exercises at a slow or medium level and stay healthy. You can:  Stretch before and after working out.  Try yoga, Pilates, or tai chi.  Lift weights.  Walk fast, swim, jog, run, climb stairs, bicycle, dance, or rollerskate.  Take aerobic classes. Exercises that burn about 150 calories:  Running 1  miles in 15 minutes.  Playing volleyball for 45 to 60 minutes.  Washing and waxing a car for 45 to 60 minutes.  Playing touch football for 45 minutes.  Walking 1  miles in 35 minutes.  Pushing a stroller 1  miles in 30 minutes.  Playing basketball for 30 minutes.  Raking leaves for 30 minutes.  Bicycling 5 miles in 30 minutes.  Walking 2 miles in 30 minutes.  Dancing for 30 minutes.  Shoveling snow for 15 minutes.  Swimming laps for 20 minutes.  Walking up stairs for 15 minutes.  Bicycling 4 miles in 15 minutes.  Gardening for 30 to 45 minutes.  Jumping rope for 15 minutes.  Washing windows or floors for 45 to 60 minutes. Document Released: 10/16/2010 Document Revised: 12/06/2011 Document Reviewed: 10/16/2010 [facility] Patient Information 2015 [place], [place]. This information is not intended to replace advice given to you by your health care provider. Make sure you discuss any questions you have with your health care provider.

## 2014-05-14 NOTE — Progress Notes (Signed)
Subjective:     Patient ID: Jeanette Rose, female   DOB: December 21, 1955, 58 y.o.   MRN: [id number]  HPI   Note: Portions of this report may have been transcribed using voice recognition software. Every effort was made to ensure accuracy; however, inadvertent computerized transcription errors may be present.   Any transcriptional errors that result from this process are unintentional.       Jeanette Rose  09-Jan-1956 [id number]  Patient Care Team: Kandice Hams, MD as PCP - General (Internal Medicine) Cloyd Stagers, MD as Consulting Physician (Internal Medicine) Darlin Coco, MD as Consulting Physician (Cardiology)  Procedure (Date: 04/03/2014):  POST-OPERATIVE DIAGNOSIS:  Acute cholecystitis w/o cholelithiasis  PROCEDURE: Procedure(s):  LAPAROSCOPIC LYSIS OF ADHESIONS X 60 MIN  LAPAROSCOPIC CHOLECYSTECTOMY  SURGEON: Surgeon(s):  Adin Hector, MD  ASSISTANT: RN    This patient returns for surgical re-evaluation.  She is getting better.  She has much less soreness in the right upper quadrant.  Mild annoying twinges.  Not bad enough to need medicine for it.  Wound from former drain site is a scab.  She covers with a Band-Aid to protect it.  Still constipated.  BM q2-3d.  Taking MiraLAX once a day only.  Not taking any pain meds.  In good spirits.  Patient Active Problem List   Diagnosis Date Noted  . Constipation 04/22/2014  . Liver mass, possible cyst 04/03/2014  . Steatohepatitis 04/03/2014  . Anxiety   . Diabetes mellitus without complication   . GERD (gastroesophageal reflux disease)   . Hypertension   . Obesity (BMI 30-39.9)   . Acute acalculous cholecystitis s/p lap chole 04/03/2014 04/02/2014    Past Medical History  Diagnosis Date  . Allergy   . Anxiety   . Anemia   . Depression   . Diabetes mellitus without complication   . GERD (gastroesophageal reflux disease)   . Hypertension   . Obesity (BMI 30-39.9)     Past Surgical History   Procedure Laterality Date  . Cholecystectomy N/A 04/03/2014    Procedure: LAPAROSCOPIC CHOLECYSTECTOMY ;  Surgeon: Adin Hector, MD;  Location: [facility];  Service: General;  Laterality: N/A;    History   Social History  . Marital Status: Divorced    Spouse Name: N/A    Number of Children: N/A  . Years of Education: N/A   Occupational History  . Not on file.   Social History Main Topics  . Smoking status: Former Smoker    Types: Cigarettes    Quit date: 04/23/2003  . Smokeless tobacco: Never Used  . Alcohol Use: No  . Drug Use: No  . Sexual Activity: No   Other Topics Concern  . Not on file   Social History Narrative  . No narrative on file    Family History  Problem Relation Age of Onset  . Heart disease Father     Current Outpatient Prescriptions  Medication Sig Dispense Refill  . acetaminophen (TYLENOL) 500 MG tablet Take 1,000 mg by mouth every 6 (six) hours as needed for moderate pain.      . cetirizine (ZYRTEC) 10 MG tablet Take 10 mg by mouth daily.      . Cyanocobalamin (VITAMIN B-12 CR PO) Take 1 tablet by mouth daily.      . DULoxetine (CYMBALTA) 60 MG capsule Take 120 mg by mouth daily.      . fluticasone (FLONASE) 50 MCG/ACT nasal spray Place 2 sprays into the nose daily as  needed for allergies.       Marland Kitchen glyBURIDE-metformin (GLUCOVANCE) 1.25-250 MG per tablet Take 1 tablet by mouth daily with breakfast.      . hydrochlorothiazide (HYDRODIURIL) 25 MG tablet Take 25 mg by mouth daily.      . hydrochlorothiazide (HYDRODIURIL) 25 MG tablet Take 25 mg by mouth daily.      . insulin glargine (LANTUS) 100 UNIT/ML injection Inject 21 Units into the skin daily.       Marland Kitchen LORazepam (ATIVAN) 1 MG tablet Take 1 mg by mouth every 8 (eight) hours as needed for anxiety.       Marland Kitchen omeprazole (PRILOSEC) 20 MG capsule Take 20 mg by mouth daily.      . simvastatin (ZOCOR) 10 MG tablet Take 10 mg by mouth daily.      . valsartan (DIOVAN) 80 MG tablet Take 80 mg by mouth daily.       Marland Kitchen VITAMIN D, CHOLECALCIFEROL, PO Take 1 capsule by mouth daily.       No current facility-administered medications for this visit.     No Known Allergies  BP 126/70  Pulse 90  Temp(Src) 97.4 F (36.3 C)  Ht 5' 4"  (1.626 m)  Wt 187 lb (84.823 kg)  BMI 32.08 kg/m2  Mr Abdomen W Wo Contrast  04/10/2014   CLINICAL DATA:  Indeterminate liver mass seen on recent CT. 1 week postop from cholecystectomy for acute cholecystitis.  EXAM: MRI ABDOMEN WITHOUT AND WITH CONTRAST  TECHNIQUE: Multiplanar multisequence MR imaging of the abdomen was performed both before and after the administration of intravenous contrast.  CONTRAST:  74m MULTIHANCE GADOBENATE DIMEGLUMINE 529 MG/ML IV SOLN  COMPARISON:  CT on 04/02/2014  FINDINGS: A benign appearing cyst is seen in the medial segment left hepatic lobe which measures 2.7 cm, and corresponds to the indeterminate lesion seen on recent CT. No complex cystic or solid liver masses are identified.  A small postop fluid collection is seen in the gallbladder fossa which measures 2.5 x 4.3 cm. There is no evidence of biliary ductal dilatation.  The pancreas, spleen, adrenal glands, and kidneys are normal in appearance. No evidence hydronephrosis. No abdominal soft tissue masses or lymphadenopathy identified.  IMPRESSION: 2.7 cm benign cyst in the medial segment of the left hepatic lobe, corresponding to indeterminate lesion seen on recent CT.  Small postop fluid collection in the gallbladder fossa measuring 2.5 x 4.3 cm.  No evidence of biliary ductal dilatation.   Electronically Signed   By: JEarle GellM.D.   On: 04/10/2014 18:01   Nm Hepatobiliary  04/03/2014   CLINICAL DATA:  Gallbladder distention and inflammation on CT. No gallstones evident on ultrasound.  EXAM: NUCLEAR MEDICINE HEPATOBILIARY IMAGING  TECHNIQUE: Sequential images of the abdomen were obtained out to 90 minutes following intravenous administration of radiopharmaceutical. 1.7 mg of morphine was  administered at 90 min.  RADIOPHARMACEUTICALS:  5.5 Millicurie [id number]  COMPARISON:  CT 04/03/1999 50  FINDINGS: There is homogeneous uptake of radiotracer within the liver. Counts are evident within the small bowel by 10 min. The gallbladder was not visualized at 90 min therefore an ejection fraction could not calculated. Morphine was administered to to visualization gallbladder. The gallbladder was not visualized up to 60 min followed morphine injection.  IMPRESSION: 1. Nonvisualization gallbladder following morphine injection is consistent with cystic duct obstruction. Findings suggest acalculous cholecystitis. 2. Patent common bile duct. 3.  Findings conveyed toSteve Grosson 04/03/2014  at14:23.   Electronically Signed  By: Suzy Bouchard M.D.   On: 04/03/2014 14:27   US Abdomen Complete  04/01/2014   CLINICAL DATA:  Abdominal tenderness, right upper quadrant  EXAM: ULTRASOUND ABDOMEN COMPLETE  COMPARISON:  None.  FINDINGS: Gallbladder:  No gallstones or wall thickening visualized, measuring 2 mm. No sonographic Murphy sign noted. Gallbladder is distended. Measuring up to 6 cm in diameter.  Common bile duct:  Diameter: 3 mm  Liver:  Indeterminate 4 x 2 x 2.8 cm hypoechoic mass right lobe of the liver. Liver parenchyma otherwise unremarkable.  IVC:  No abnormality visualized.  Pancreas:  Visualized portion unremarkable.  Spleen:  Size and appearance within normal limits.  Right Kidney:  Length: 11.1 cm. Echogenicity within normal limits. No mass or hydronephrosis visualized.  Left Kidney:  Length: 13.5 cm. Echogenicity within normal limits. No mass or hydronephrosis visualized.  Abdominal aorta:  No aneurysm visualized.  Other findings:  None.  IMPRESSION: Indeterminate hypoechoic mass within the right lobe of the liver. Further evaluation with liver MRI recommended. These results will be called to the ordering clinician or representative by the Radiologist Assistant, and communication documented in  the PACS or zVision Dashboard.  Distended gallbladder correlation with liver function tests recommended. Otherwise no sonographic evidence of cholecystitis.   Electronically Signed   By: Margaree Mackintosh M.D.   On: 04/01/2014 12:00   Ct Abdomen Pelvis W Contrast  04/02/2014   CLINICAL DATA:  Right upper quadrant pain.  EXAM: CT ABDOMEN AND PELVIS WITH CONTRAST  TECHNIQUE: Multidetector CT imaging of the abdomen and pelvis was performed using the standard protocol following bolus administration of intravenous contrast.  CONTRAST:  66m OMNIPAQUE IOHEXOL 300 MG/ML SOLN, 1055mOMNIPAQUE IOHEXOL 300 MG/ML SOLN  COMPARISON:  None.  FINDINGS: Abnormal inflammatory process right upper quadrant. This surrounds the duodenum and gallbladder. On the recent ultrasound, the distended gallbladder did not have thickened wall as may be expected if findings were related to primary cholecystitis. The duodenum itself has slightly thickened walls and primary duodenal abnormality with secondary gallbladder distension is a consideration although primary gallbladder abnormality not entirely excluded. There is no evidence of extravasation of ingested contrast or free intraperitoneal air. The pancreas does not appear to be the primary inflammatory process. No calcified gallstones along the common bile duct. No intrahepatic biliary duct dilation.  Adenopathy peripancreatic region most likely related to the upper abdominal inflammatory process.  The small amount of fluid within the right abdomen extends towards the appendix which is markedly attenuated in caliber. Primary appendix process as a cause for the above described findings felt unlikely.  Hiatal hernia with distal esophageal wall thickening may be related to under distension or reflux. No definitive mass at this level is noted.  Within the anterior aspect of the medial segment of the left lobe of the liver, there is a 2.8 cm low-density structure which cannot be confirmed as a simple  cyst on the present exam. This does not demonstrate delayed contrast enhanced imaging as may be seen with a hemangioma. Exact etiology indeterminate. This can be assessed on elective contrast-enhanced liver MR after the patient's acute episode has cleared.  No worrisome splenic, renal or adrenal abnormality.  No abdominal aortic aneurysm. Mild atherosclerotic type changes of the lower abdominal aorta our.  Noncontrast filled decompressed views of the urinary bladder without Slater Mcmanaman abnormality. Prior tubal ligation.  No bony destructive process. Mild hip joint degenerative changes noted bilaterally.  Minimal atelectatic changes lung bases.  IMPRESSION: Abnormal inflammatory process right  upper quadrant. This surrounds the duodenum and gallbladder. On the recent ultrasound, the distended gallbladder did not have thickened wall as may be expected if findings were related to primary cholecystitis. The duodenum itself has slightly thickened walls and primary duodenal abnormality with secondary gallbladder distension is a consideration although primary gallbladder abnormality not entirely excluded. There is no evidence of extravasation of ingested contrast or free intraperitoneal air.  Adenopathy peripancreatic region most likely related to the upper abdominal inflammatory process.  Hiatal hernia with distal esophageal wall thickening may be related to under distension or reflux. No definitive mass at this level is noted.  2.8 cm low-density structure within the anterior aspect of the medial segment of the left lobe of the liver, cannot be confirmed as a simple cyst or hemangioma. Exact etiology indeterminate. This can be assessed on elective contrast-enhanced liver MR after the patient's acute episode has cleared.  These results were called by telephone at the time of interpretation on 04/02/2014 at 1:53 PM to Dr. Pryor Curia , who verbally acknowledged these results.   Electronically Signed   By: Chauncey Cruel M.D.   On:  04/02/2014 14:03     Review of Systems  Constitutional: Negative for fever, chills and diaphoresis.  HENT: Negative for ear pain, sore throat and trouble swallowing.   Eyes: Negative for photophobia and visual disturbance.  Respiratory: Negative for cough and choking.   Cardiovascular: Negative for chest pain and palpitations.  Gastrointestinal: Negative for nausea, vomiting, abdominal pain, diarrhea, constipation, anal bleeding and rectal pain.  Genitourinary: Negative for dysuria, frequency and difficulty urinating.  Musculoskeletal: Negative for gait problem and myalgias.  Skin: Negative for color change, pallor and rash.  Neurological: Negative for dizziness, speech difficulty, weakness and numbness.  Hematological: Negative for adenopathy.  Psychiatric/Behavioral: Negative for confusion and agitation. The patient is not nervous/anxious.        Objective:   Physical Exam  Constitutional: She is oriented to person, place, and time. She appears well-developed and well-nourished. No distress.  HENT:  Head: Normocephalic.  Mouth/Throat: Oropharynx is clear and moist. No oropharyngeal exudate.  Eyes: Conjunctivae and EOM are normal. Pupils are equal, round, and reactive to light. No scleral icterus.  Neck: Normal range of motion. No tracheal deviation present.  Cardiovascular: Normal rate and intact distal pulses.   Pulmonary/Chest: Effort normal. No respiratory distress. She exhibits no tenderness.  Abdominal: Soft. She exhibits no distension. There is no tenderness. There is no rigidity, no rebound, no guarding, no tenderness at McBurney's point and negative Murphy's sign. Hernia confirmed negative in the right inguinal area and confirmed negative in the left inguinal area.  Incisions clean with normal healing ridges.  No hernias  Genitourinary: No vaginal discharge found.  Musculoskeletal: Normal range of motion. She exhibits no tenderness.  Lymphadenopathy:       Right: No  inguinal adenopathy present.       Left: No inguinal adenopathy present.  Neurological: She is alert and oriented to person, place, and time. No cranial nerve deficit. She exhibits normal muscle tone. Coordination normal.  Skin: Skin is warm and dry. No rash noted. She is not diaphoretic.  Psychiatric: She has a normal mood and affect. Her behavior is normal.       Assessment:     Slowly recovering status post emergent cholecystectomy for necrotic acute cholecystitis.     Plan:     Considering how bad her gallbladder was, I think she is doing okay.   Regular activity.  Low impact exercise such as walking an hour a day at least ideal.  Do not push through pain.  Diet as tolerated.  Low fat high fiber diet ideal.  Bowel regimen with 30 g fiber a day and fiber supplement as needed to avoid problems.  Increase Miralax to 2-3 doses a day until BM daily.  Get her bowels moving.  Heat and Tylenol as needed for pain.  Return to clinic as needed.   If she does not improve or feels worse, consider lab work and x-ray studies to rule out biloma/leak/abscess.  I doubt that is what is going on at this time.  Instructions discussed.  Followup with primary care physician for other health issues as would normally be done.  Consider screening for malignancies (breast, prostate, colon, melanoma, etc) as appropriate.  Questions answered.  The patient expressed understanding and appreciation

## 2014-09-21 ENCOUNTER — Emergency Department (HOSPITAL_COMMUNITY): Payer: 59

## 2014-09-21 ENCOUNTER — Emergency Department (HOSPITAL_COMMUNITY)
Admission: EM | Admit: 2014-09-21 | Discharge: 2014-09-21 | Disposition: A | Discharge status: Home / Self Care | Payer: 59 | Attending: Emergency Medicine | Admitting: Emergency Medicine

## 2014-09-21 ENCOUNTER — Encounter (HOSPITAL_COMMUNITY): Payer: Self-pay | Admitting: Emergency Medicine

## 2014-09-21 DIAGNOSIS — E119 Type 2 diabetes mellitus without complications: Secondary | ICD-10-CM | POA: Diagnosis not present

## 2014-09-21 DIAGNOSIS — M797 Fibromyalgia: Secondary | ICD-10-CM | POA: Diagnosis not present

## 2014-09-21 DIAGNOSIS — Z87891 Personal history of nicotine dependence: Secondary | ICD-10-CM | POA: Diagnosis not present

## 2014-09-21 DIAGNOSIS — K219 Gastro-esophageal reflux disease without esophagitis: Secondary | ICD-10-CM | POA: Insufficient documentation

## 2014-09-21 DIAGNOSIS — F419 Anxiety disorder, unspecified: Secondary | ICD-10-CM | POA: Diagnosis not present

## 2014-09-21 DIAGNOSIS — Z794 Long term (current) use of insulin: Secondary | ICD-10-CM | POA: Diagnosis not present

## 2014-09-21 DIAGNOSIS — Z79899 Other long term (current) drug therapy: Secondary | ICD-10-CM | POA: Insufficient documentation

## 2014-09-21 DIAGNOSIS — J029 Acute pharyngitis, unspecified: Secondary | ICD-10-CM | POA: Diagnosis not present

## 2014-09-21 DIAGNOSIS — Z862 Personal history of diseases of the blood and blood-forming organs and certain disorders involving the immune mechanism: Secondary | ICD-10-CM | POA: Diagnosis not present

## 2014-09-21 DIAGNOSIS — I10 Essential (primary) hypertension: Secondary | ICD-10-CM | POA: Insufficient documentation

## 2014-09-21 DIAGNOSIS — R059 Cough, unspecified: Secondary | ICD-10-CM

## 2014-09-21 DIAGNOSIS — F329 Major depressive disorder, single episode, unspecified: Secondary | ICD-10-CM | POA: Insufficient documentation

## 2014-09-21 DIAGNOSIS — R05 Cough: Secondary | ICD-10-CM | POA: Insufficient documentation

## 2014-09-21 DIAGNOSIS — E669 Obesity, unspecified: Secondary | ICD-10-CM | POA: Insufficient documentation

## 2014-09-21 HISTORY — DX: Fibromyalgia: M79.7

## 2014-09-21 MED ORDER — HYDROCOD POLST-CHLORPHEN POLST 10-8 MG/5ML PO LQCR: 5.0000 mL | Freq: Two times a day (BID) | ORAL | Status: DC | PRN

## 2014-09-21 MED ORDER — AZITHROMYCIN 250 MG PO TABS: 250.0000 mg | ORAL_TABLET | Freq: Every day | ORAL | Status: DC

## 2014-09-21 NOTE — Discharge Instructions (Signed)
Cough, Adult   A cough is a reflex. It helps you clear your throat and airways. A cough can help heal your body. A cough can last 2 or 3 weeks (acute) or may last more than 8 weeks (chronic). Some common causes of a cough can include an infection, allergy, or a cold.  HOME CARE  · Only take medicine as told by your doctor.  · If given, take your medicines (antibiotics) as told. Finish them even if you start to feel better.  · Use a cold steam vaporizer or humidifier in your home. This can help loosen thick spit (secretions).  · Sleep so you are almost sitting up (semi-upright). Use pillows to do this. This helps reduce coughing.  · Rest as needed.  · Stop smoking if you smoke.  GET HELP RIGHT AWAY IF:  · You have yellowish-white fluid (pus) in your thick spit.  · Your cough gets worse.  · Your medicine does not reduce coughing, and you are losing sleep.  · You cough up blood.  · You have trouble breathing.  · Your pain gets worse and medicine does not help.  · You have a fever.  MAKE SURE YOU:   · Understand these instructions.  · Will watch your condition.  · Will get help right away if you are not doing well or get worse.  Document Released: 05/27/2011 Document Revised: 01/28/2014 Document Reviewed: 05/27/2011  [employer]® Patient Information ©2015 [employer]. This information is not intended to replace advice given to you by your health care provider. Make sure you discuss any questions you have with your health care provider.

## 2014-09-21 NOTE — ED Notes (Signed)
Pt ambulatory to exam room with steady gait. No acute distress.

## 2014-09-21 NOTE — ED Provider Notes (Signed)
CSN: [id number]     Arrival date & time 09/21/14  1820 History  This chart was scribed for non-physician practitioner Charlann Lange, PA-C working Debby Freiberg, MD by Randa Evens, ED Scribe. This patient was seen in room WTR6/WTR6 and the patient's care was started at 8:10 PM.     Chief Complaint  Patient presents with  . Cough   Patient is a 58 y.o. female presenting with cough. The history is provided by the patient. No language interpreter was used.  Cough Associated symptoms: sore throat   Associated symptoms: no fever    HPI Comments: Jeanette Rose is a 58 y.o. female who presents to the Emergency Department complaining of progressively worsening productive cough of yellow sputum onset 4 days prior. Pt states she has had associated congestion and sore throat. Pt states she has HX of seasonal allergies. Pt states she has been taking floase with no relief. PT denies fever, nausea or vomiting.   Past Medical History  Diagnosis Date  . Allergy   . Anxiety   . Anemia   . Depression   . Diabetes mellitus without complication   . GERD (gastroesophageal reflux disease)   . Hypertension   . Obesity (BMI 30-39.9)   . Fibromyalgia    Past Surgical History  Procedure Laterality Date  . Cholecystectomy N/A 04/03/2014    Procedure: LAPAROSCOPIC CHOLECYSTECTOMY ;  Surgeon: Adin Hector, MD;  Location: [facility];  Service: General;  Laterality: N/A;   Family History  Problem Relation Age of Onset  . Heart disease Father    History  Substance Use Topics  . Smoking status: Former Smoker    Types: Cigarettes    Quit date: 04/23/2003  . Smokeless tobacco: Never Used  . Alcohol Use: No   OB History    No data available     Review of Systems  Constitutional: Negative for fever.  HENT: Positive for congestion and sore throat.   Respiratory: Positive for cough.   Gastrointestinal: Negative for nausea and vomiting.  All other systems reviewed and are negative.   Allergies   Review of patient's allergies indicates no known allergies.  Home Medications   Prior to Admission medications   Medication Sig Start Date End Date Taking? Authorizing Provider  acetaminophen (TYLENOL) 500 MG tablet Take 1,000 mg by mouth every 6 (six) hours as needed for moderate pain.   Yes Historical Provider, MD  cetirizine (ZYRTEC) 10 MG tablet Take 10 mg by mouth daily.   Yes Historical Provider, MD  Cyanocobalamin (VITAMIN B-12 CR PO) Take 1 tablet by mouth daily.   Yes Historical Provider, MD  DULoxetine (CYMBALTA) 60 MG capsule Take 120 mg by mouth daily.   Yes Historical Provider, MD  fluticasone (FLONASE) 50 MCG/ACT nasal spray Place 2 sprays into the nose daily as needed for allergies.    Yes Historical Provider, MD  glyBURIDE-metformin (GLUCOVANCE) 1.25-250 MG per tablet Take 1 tablet by mouth daily with breakfast.   Yes Historical Provider, MD  hydrochlorothiazide (HYDRODIURIL) 25 MG tablet Take 25 mg by mouth daily.   Yes Historical Provider, MD  insulin glargine (LANTUS) 100 UNIT/ML injection Inject 22 Units into the skin daily.    Yes Historical Provider, MD  IRBESARTAN PO Take 1 tablet by mouth daily. .   Yes Historical Provider, MD  LORazepam (ATIVAN) 1 MG tablet Take 1 mg by mouth every 8 (eight) hours as needed for anxiety.    Yes Historical Provider, MD  omeprazole (PRILOSEC) 20  MG capsule Take 20 mg by mouth daily.   Yes Historical Provider, MD  simvastatin (ZOCOR) 10 MG tablet Take 10 mg by mouth daily.   Yes Historical Provider, MD  VITAMIN D, CHOLECALCIFEROL, PO Take 1 capsule by mouth daily.   Yes Historical Provider, MD   Triage Vitals: BP 132/64 mmHg  Pulse 108  Temp(Src) 99.1 F (37.3 C) (Oral)  Resp 18  SpO2 98%  Physical Exam  Constitutional: She is oriented to person, place, and time. She appears well-developed and well-nourished. No distress.  HENT:  Head: Normocephalic and atraumatic.  Nose: Mucosal edema present.  Mouth/Throat: Oropharynx is clear  and moist.  Eyes: Conjunctivae and EOM are normal.  Neck: Neck supple. No tracheal deviation present.  Cardiovascular: Normal rate.   Pulmonary/Chest: Effort normal and breath sounds normal. No respiratory distress. She has no wheezes.  Musculoskeletal: Normal range of motion.  Lymphadenopathy:    She has no cervical adenopathy.  Neurological: She is alert and oriented to person, place, and time.  Skin: Skin is warm and dry.  Psychiatric: She has a normal mood and affect. Her behavior is normal.  Nursing note and vitals reviewed.   ED Course  Procedures (including critical care time) DIAGNOSTIC STUDIES: Oxygen Saturation is 98% on RA, normal by my interpretation.    COORDINATION OF CARE: 8:20 PM-Discussed treatment plan with pt at bedside and pt agreed to plan.     Labs Review Labs Reviewed - No data to display  Imaging Review Dg Chest 2 View  09/21/2014   CLINICAL DATA:  Cough  EXAM: CHEST  2 VIEW  COMPARISON:  10/28/2013  FINDINGS: The heart size and mediastinal contours are within normal limits. Both lungs are clear. The visualized skeletal structures are unremarkable.  IMPRESSION: No active cardiopulmonary disease.   Electronically Signed   By: Inez Catalina M.D.   On: 09/21/2014 19:41     EKG Interpretation None      MDM   Final diagnoses:  Cough   1. Cough  Well appearing patient in mild discomfort. VSS, low grade temp., actively coughing. Will treat with abx and encourage PCP follow up.   I personally performed the services described in this documentation, which was scribed in my presence. The recorded information has been reviewed and is accurate.       Jeanette Oats, PA-C 09/26/14 2217  Debby Freiberg, MD 09/28/14 1314

## 2014-09-21 NOTE — ED Notes (Signed)
Pt c/o cough that started Thursday, pt states that she has to "cough so hard to get anything up".  Pt states that when she gets phlegm up its yellow.  Pt states that she started feeling bad Tuesday night.

## 2015-07-20 IMAGING — CT CT ABD-PELV W/ CM
1 of 3 series · 12 of 32 positions shown, 17 images · IV contrast (OMNIPAQUE 300)
Comparison: None.

CLINICAL DATA: Right upper quadrant pain.

EXAM:
CT ABDOMEN AND PELVIS WITH CONTRAST
TECHNIQUE: Multidetector CT imaging of the abdomen and pelvis was performed
using the standard protocol following bolus administration of
intravenous contrast.
CONTRAST:  50mL OMNIPAQUE IOHEXOL 300 MG/ML SOLN, 100mL OMNIPAQUE
IOHEXOL 300 MG/ML SOLN

[Series 2: abd/pel with · axial · 0.86mm/px · z∈[-578,-163]mm · 12 of 95 slices shown, 17 images]
[im 6/95  soft-tissue]
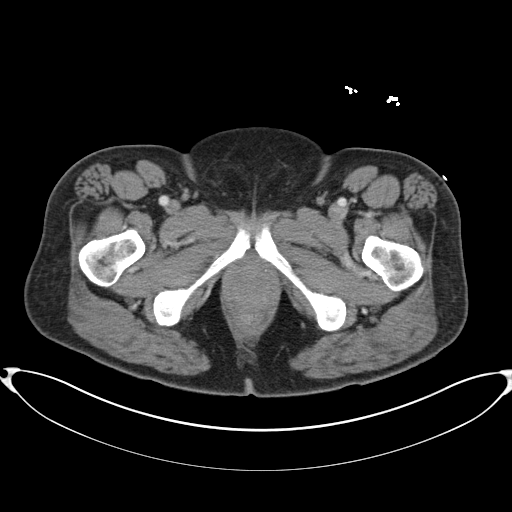
[im 6/95  bone]
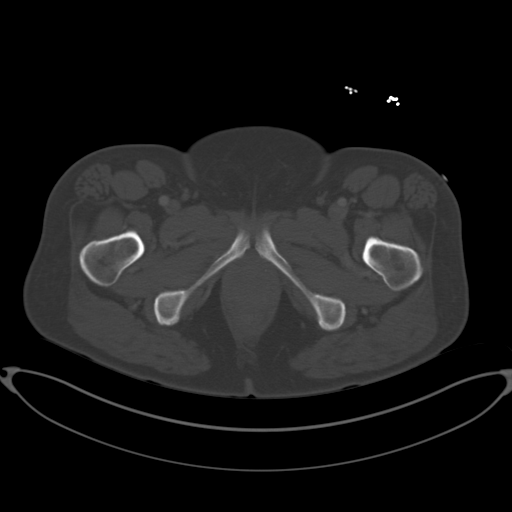
[im 17/95  soft-tissue]
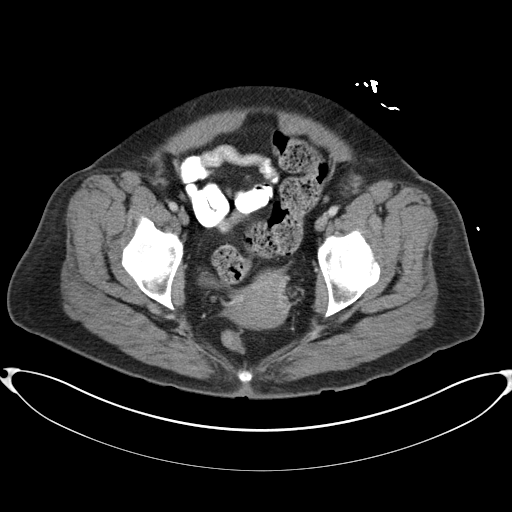
[im 23/95  soft-tissue]
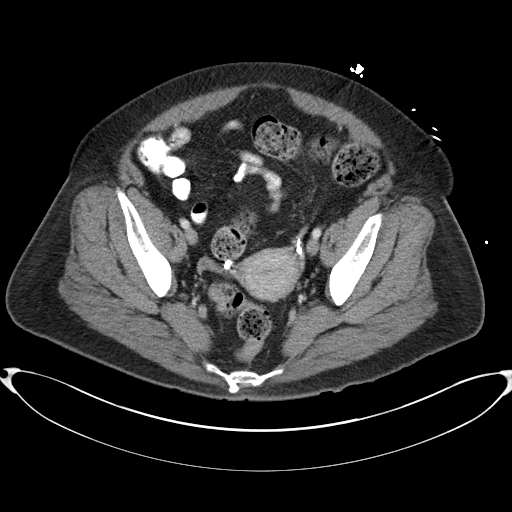
[im 34/95  soft-tissue]
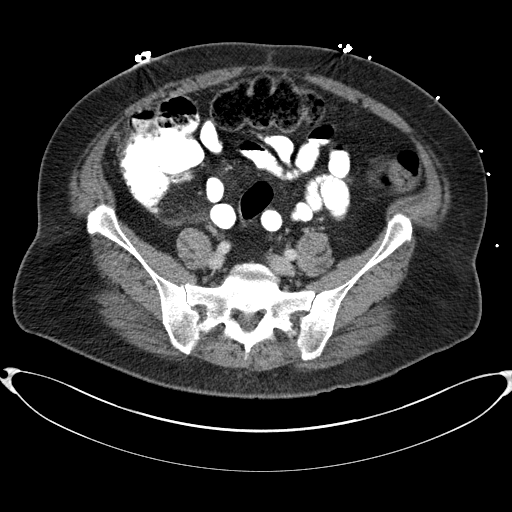
[im 39/95  soft-tissue]
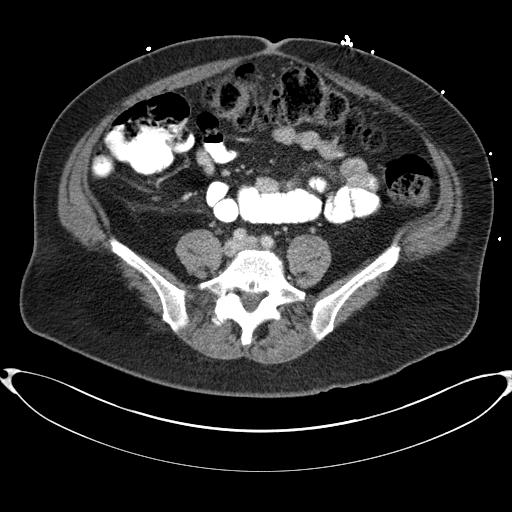
[im 50/95  soft-tissue]
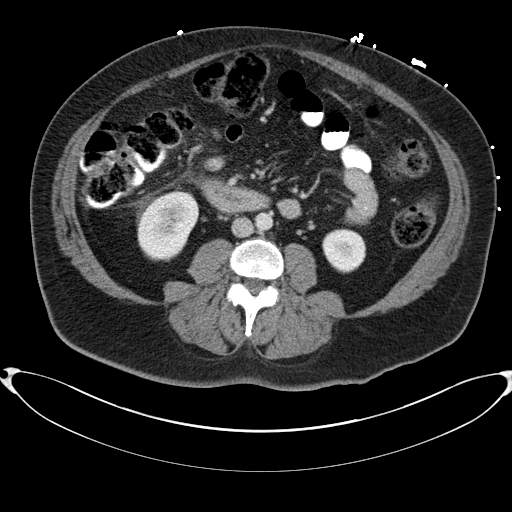
[im 56/95  soft-tissue]
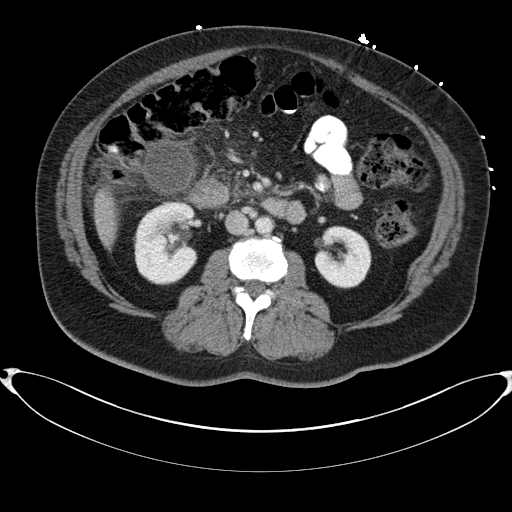
[im 61/95  soft-tissue]
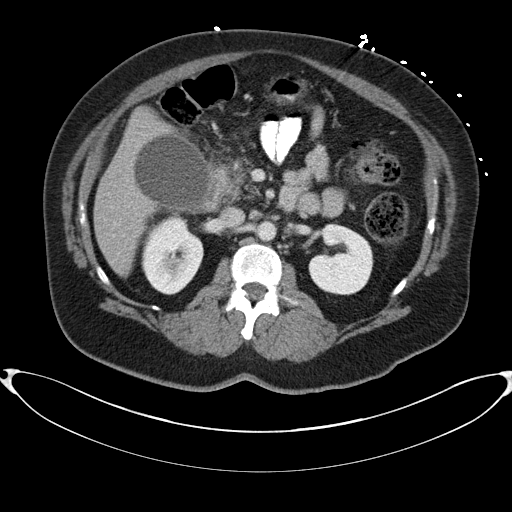
[im 72/95  soft-tissue]
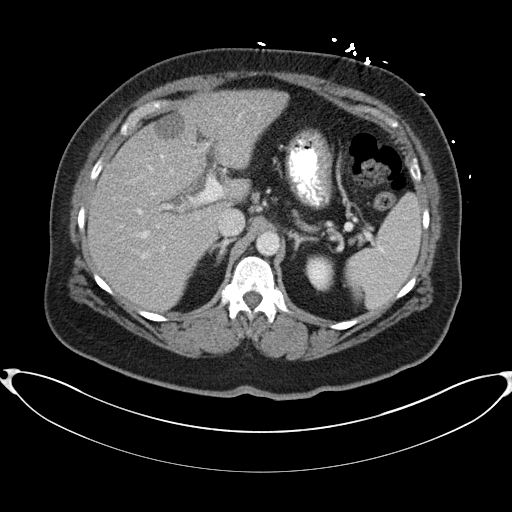
[im 72/95  lung]
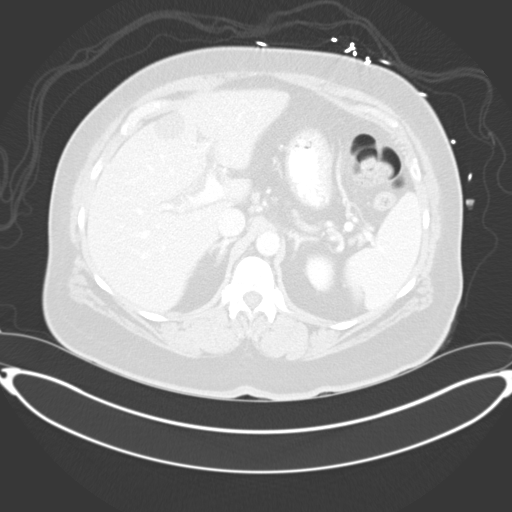
[im 72/95  bone]
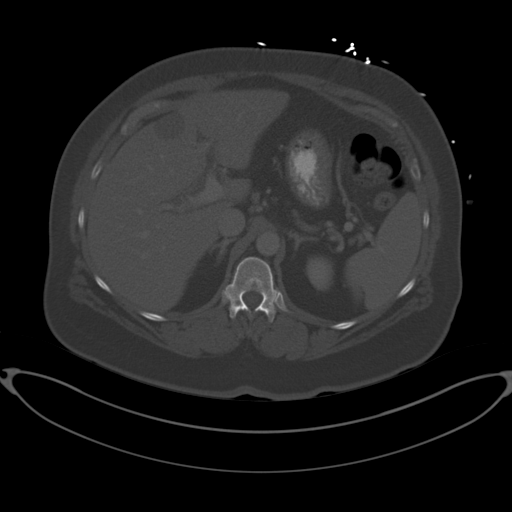
[im 78/95  soft-tissue]
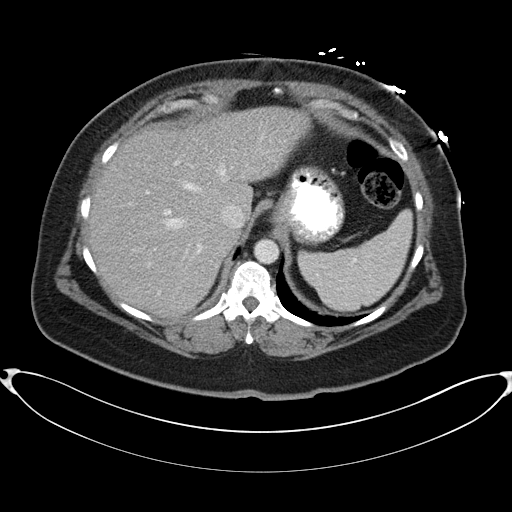
[im 78/95  lung]
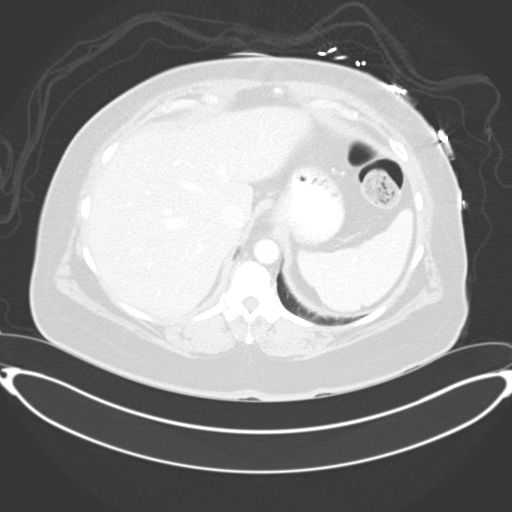
[im 83/95  lung]
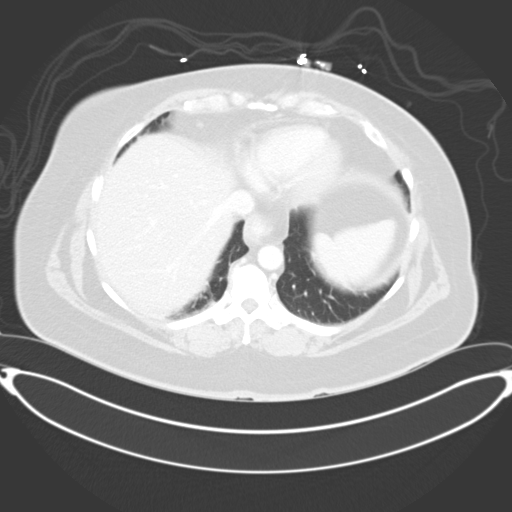
[im 89/95  soft-tissue]
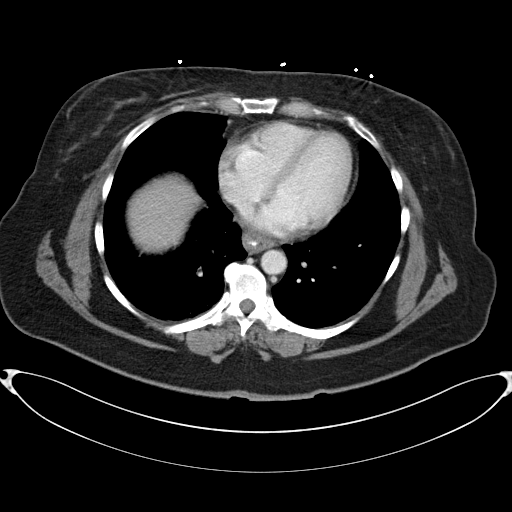
[im 89/95  lung]
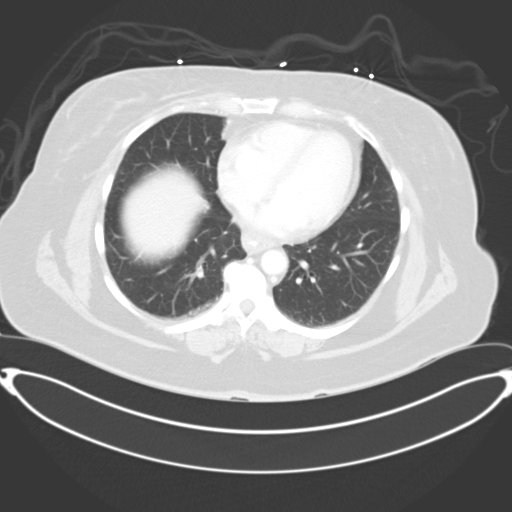

[12 of 32 positions shown; findings below may reference images not displayed]

FINDINGS: Abnormal inflammatory process right upper quadrant. This surrounds
the duodenum and gallbladder. On the recent ultrasound, the
distended gallbladder did not have thickened wall as may be expected
if findings were related to primary cholecystitis. The duodenum
itself has slightly thickened walls and primary duodenal abnormality
with secondary gallbladder distension is a consideration although
primary gallbladder abnormality not entirely excluded. There is no
evidence of extravasation of ingested contrast or free
intraperitoneal air. The pancreas does not appear to be the primary
inflammatory process. No calcified gallstones along the common bile
duct. No intrahepatic biliary duct dilation.

Adenopathy peripancreatic region most likely related to the upper
abdominal inflammatory process.

The small amount of fluid within the right abdomen extends towards
the appendix which is markedly attenuated in caliber. Primary
appendix process as a cause for the above described findings felt
unlikely.

Hiatal hernia with distal esophageal wall thickening may be related
to under distension or reflux. No definitive mass at this level is
noted.

Within the anterior aspect of the medial segment of the left lobe of
the liver, there is a 2.8 cm low-density structure which cannot be
confirmed as a simple cyst on the present exam. This does not
demonstrate delayed contrast enhanced imaging as may be seen with a
hemangioma. Exact etiology indeterminate. This can be assessed on
elective contrast-enhanced liver MR after the patient's acute
episode has cleared.

No worrisome splenic, renal or adrenal abnormality.

No abdominal aortic aneurysm. Mild atherosclerotic type changes of
the lower abdominal aorta our.

Noncontrast filled decompressed views of the urinary bladder without
gross abnormality. Prior tubal ligation.

No bony destructive process. Mild hip joint degenerative changes
noted bilaterally.

Minimal atelectatic changes lung bases.
IMPRESSION: Abnormal inflammatory process right upper quadrant. This surrounds
the duodenum and gallbladder. On the recent ultrasound, the
distended gallbladder did not have thickened wall as may be expected
if findings were related to primary cholecystitis. The duodenum
itself has slightly thickened walls and primary duodenal abnormality
with secondary gallbladder distension is a consideration although
primary gallbladder abnormality not entirely excluded. There is no
evidence of extravasation of ingested contrast or free
intraperitoneal air.

Adenopathy peripancreatic region most likely related to the upper
abdominal inflammatory process.

Hiatal hernia with distal esophageal wall thickening may be related
to under distension or reflux. No definitive mass at this level is
noted.

2.8 cm low-density structure within the anterior aspect of the
medial segment of the left lobe of the liver, cannot be confirmed as
a simple cyst or hemangioma. Exact etiology indeterminate. This can
be assessed on elective contrast-enhanced liver MR after the
patient's acute episode has cleared.

These results were called by telephone at the time of interpretation
on 04/02/2014 at [DATE] to Dr. LABELLE SALHA BRACK TIGER , who verbally
acknowledged these results.

## 2015-07-21 IMAGING — NM NM HEPATOBILIARY IMAGE, INC GB
1 series · 6 of 6 positions shown · non-contrast
Comparison: CT 04/03/1999 50

CLINICAL DATA: Gallbladder distention and inflammation on CT. No
gallstones evident on ultrasound.

EXAM:
NUCLEAR MEDICINE HEPATOBILIARY IMAGING
TECHNIQUE: Sequential images of the abdomen were obtained [DATE] minutes
following intravenous administration of radiopharmaceutical. 1.7 mg
of morphine was administered at 90 min.
RADIOPHARMACEUTICALS:  5.5 Millicurie Ic-GGm Choletec

[Series 1: hepato · 4.46mm/px · 6 of 60 frames shown]
[frame 6/60]
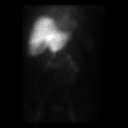
[frame 16/60]
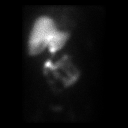
[frame 26/60]
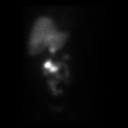
[frame 36/60]
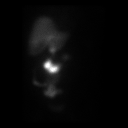
[frame 46/60]
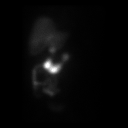
[frame 56/60]
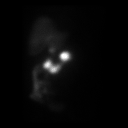

[6 of 6 positions shown; findings below may reference images not displayed]

FINDINGS: There is homogeneous uptake of radiotracer within the liver. Counts
are evident within the small bowel by 10 min. The gallbladder was
not visualized at 90 min therefore an ejection fraction could not
calculated. Morphine was administered to to visualization
gallbladder. The gallbladder was not visualized up to 60 min
followed morphine injection.
IMPRESSION: 1. Nonvisualization gallbladder following morphine injection is
consistent with cystic duct obstruction. Findings suggest acalculous
cholecystitis.
2. Patent common bile duct.
3.  Findings conveyed Savio Locklear 04/03/2014  at[DATE].

## 2016-05-07 ENCOUNTER — Other Ambulatory Visit (HOSPITAL_COMMUNITY)
Admission: RE | Admit: 2016-05-07 | Discharge: 2016-05-07 | Disposition: A | Discharge status: Home / Self Care | Payer: BLUE CROSS/BLUE SHIELD | Source: Ambulatory Visit | Attending: Internal Medicine | Admitting: Internal Medicine

## 2016-05-07 ENCOUNTER — Other Ambulatory Visit: Payer: Self-pay | Admitting: Internal Medicine

## 2016-05-07 DIAGNOSIS — Z124 Encounter for screening for malignant neoplasm of cervix: Secondary | ICD-10-CM | POA: Diagnosis not present

## 2016-05-11 LAB — CYTOLOGY - PAP

## 2016-07-03 ENCOUNTER — Emergency Department (HOSPITAL_COMMUNITY)
Admission: EM | Admit: 2016-07-03 | Discharge: 2016-07-03 | Disposition: A | Discharge status: Home / Self Care | Payer: BLUE CROSS/BLUE SHIELD | Attending: Emergency Medicine | Admitting: Emergency Medicine

## 2016-07-03 ENCOUNTER — Encounter (HOSPITAL_COMMUNITY): Payer: Self-pay | Admitting: Emergency Medicine

## 2016-07-03 ENCOUNTER — Emergency Department (HOSPITAL_COMMUNITY): Payer: BLUE CROSS/BLUE SHIELD

## 2016-07-03 DIAGNOSIS — Z794 Long term (current) use of insulin: Secondary | ICD-10-CM | POA: Diagnosis not present

## 2016-07-03 DIAGNOSIS — R059 Cough, unspecified: Secondary | ICD-10-CM

## 2016-07-03 DIAGNOSIS — Z87891 Personal history of nicotine dependence: Secondary | ICD-10-CM | POA: Insufficient documentation

## 2016-07-03 DIAGNOSIS — E119 Type 2 diabetes mellitus without complications: Secondary | ICD-10-CM | POA: Insufficient documentation

## 2016-07-03 DIAGNOSIS — B349 Viral infection, unspecified: Secondary | ICD-10-CM | POA: Diagnosis not present

## 2016-07-03 DIAGNOSIS — R05 Cough: Secondary | ICD-10-CM

## 2016-07-03 DIAGNOSIS — I10 Essential (primary) hypertension: Secondary | ICD-10-CM | POA: Diagnosis not present

## 2016-07-03 DIAGNOSIS — R509 Fever, unspecified: Secondary | ICD-10-CM | POA: Diagnosis present

## 2016-07-03 LAB — CBG MONITORING, ED: Glucose-Capillary: 184 mg/dL — ABNORMAL HIGH (ref 65–99)

## 2016-07-03 LAB — BASIC METABOLIC PANEL
Anion gap: 9 (ref 5–15)
BUN: 9 mg/dL (ref 6–20)
CHLORIDE: 105 mmol/L (ref 101–111)
CO2: 21 mmol/L — ABNORMAL LOW (ref 22–32)
CREATININE: 0.66 mg/dL (ref 0.44–1.00)
Calcium: 9 mg/dL (ref 8.9–10.3)
GFR calc Af Amer: >60 mL/min (ref >60)
GFR calc non Af Amer: >60 mL/min (ref >60)
Glucose, Bld: 185 mg/dL — ABNORMAL HIGH (ref 65–99)
Potassium: 2.7 mmol/L — CL (ref 3.5–5.1)
SODIUM: 135 mmol/L (ref 135–145)

## 2016-07-03 LAB — CBC
HCT: 36.9 % (ref 36.0–46.0)
Hemoglobin: 12.6 g/dL (ref 12.0–15.0)
MCH: 30 pg (ref 26.0–34.0)
MCHC: 34.1 g/dL (ref 30.0–36.0)
MCV: 87.9 fL (ref 78.0–100.0)
PLATELETS: 172 10*3/uL (ref 150–400)
RBC: 4.2 MIL/uL (ref 3.87–5.11)
RDW: 13.2 % (ref 11.5–15.5)
WBC: 7.2 10*3/uL (ref 4.0–10.5)

## 2016-07-03 LAB — I-STAT CG4 LACTIC ACID, ED: LACTIC ACID, VENOUS: 1.84 mmol/L (ref 0.5–1.9)

## 2016-07-03 LAB — URINALYSIS, ROUTINE W REFLEX MICROSCOPIC
Bilirubin Urine: NEGATIVE
GLUCOSE, UA: 100 mg/dL — AB
Hgb urine dipstick: NEGATIVE
KETONES UR: NEGATIVE mg/dL
Leukocytes, UA: NEGATIVE
Nitrite: NEGATIVE
Protein, ur: NEGATIVE mg/dL
Specific Gravity, Urine: 1.014 (ref 1.005–1.030)
pH: 6 (ref 5.0–8.0)

## 2016-07-03 MED ORDER — POTASSIUM CHLORIDE CRYS ER 20 MEQ PO TBCR
60.0000 meq | EXTENDED_RELEASE_TABLET | Freq: Once | ORAL | Status: AC
  Administered 2016-07-03: 60 meq via ORAL
  Filled 2016-07-03: qty 3

## 2016-07-03 MED ORDER — POTASSIUM CHLORIDE CRYS ER 20 MEQ PO TBCR: 40.0000 meq | EXTENDED_RELEASE_TABLET | Freq: Every day | ORAL | 0 refills | Status: DC

## 2016-07-03 MED ORDER — POTASSIUM CHLORIDE 10 MEQ/100ML IV SOLN
10.0000 meq | Freq: Once | INTRAVENOUS | Status: AC
  Administered 2016-07-03: 10 meq via INTRAVENOUS
  Filled 2016-07-03: qty 100

## 2016-07-03 MED ORDER — SODIUM CHLORIDE 0.9 % IV BOLUS (SEPSIS)
1000.0000 mL | Freq: Once | INTRAVENOUS | Status: AC
  Administered 2016-07-03: 1000 mL via INTRAVENOUS

## 2016-07-03 NOTE — ED Provider Notes (Signed)
[place] DEPT Provider Note   CSN: [id number] Arrival date & time: 07/03/16  1603     History   Chief Complaint Chief Complaint  Patient presents with  . Fever  . Weakness    HPI Jeanette Rose is a 60 y.o. female.  Patient presents today with a chief complaint of fever, generalized weakness, dizziness, cough, and headache.  She reports onset of symptoms yesterday and states that the symptoms have been persistent since that time.  She was given 1000 mg Tylenol en route to the ED.  She states that her temp was 100.8 F at home.  She does report a cough over the past couple of days.  She denies chest pain or SOB.  She does have some lesions of the eyebrows bilaterally, which she states have been present for the past 4 months.  She states that she has been evaluated by Dermatology for these lesions and they have told her that it was a "bacterial infection."  She states that the lesions do not look different today then how they have in the past.  No recent drainage from the lesions.  She denies nausea, vomiting, diarrhea, abdominal pain, urinary symptoms, chest pain, SOB, or syncope.      Past Medical History:  Diagnosis Date  . Allergy   . Anemia   . Anxiety   . Depression   . Diabetes mellitus without complication ([place])   . Fibromyalgia   . GERD (gastroesophageal reflux disease)   . Hypertension   . Obesity (BMI 30-39.9)     Patient Active Problem List   Diagnosis Date Noted  . Constipation 04/22/2014  . Liver mass, possible cyst 04/03/2014  . Steatohepatitis 04/03/2014  . Anxiety   . Diabetes mellitus without complication ([place])   . GERD (gastroesophageal reflux disease)   . Hypertension   . Obesity (BMI 30-39.9)   . Acute acalculous cholecystitis s/p lap chole 04/03/2014 04/02/2014    Past Surgical History:  Procedure Laterality Date  . CHOLECYSTECTOMY N/A 04/03/2014   Procedure: LAPAROSCOPIC CHOLECYSTECTOMY ;  Surgeon: Adin Hector, MD;  Location: [facility];   Service: General;  Laterality: N/A;    OB History    No data available       Home Medications    Prior to Admission medications   Medication Sig Start Date End Date Taking? Authorizing Provider  acetaminophen (TYLENOL) 500 MG tablet Take 1,000 mg by mouth every 6 (six) hours as needed for moderate pain.    Historical Provider, MD  azithromycin (ZITHROMAX) 250 MG tablet Take 1 tablet (250 mg total) by mouth daily. Take first 2 tablets together, then 1 every day until finished. 09/21/14   Charlann Lange, PA-C  cetirizine (ZYRTEC) 10 MG tablet Take 10 mg by mouth daily.    Historical Provider, MD  chlorpheniramine-HYDROcodone (TUSSIONEX PENNKINETIC ER) 10-8 MG/5ML LQCR Take 5 mLs by mouth every 12 (twelve) hours as needed for cough. 09/21/14   Charlann Lange, PA-C  Cyanocobalamin (VITAMIN B-12 CR PO) Take 1 tablet by mouth daily.    Historical Provider, MD  DULoxetine (CYMBALTA) 60 MG capsule Take 120 mg by mouth daily.    Historical Provider, MD  fluticasone (FLONASE) 50 MCG/ACT nasal spray Place 2 sprays into the nose daily as needed for allergies.     Historical Provider, MD  glyBURIDE-metformin (GLUCOVANCE) 1.25-250 MG per tablet Take 1 tablet by mouth daily with breakfast.    Historical Provider, MD  hydrochlorothiazide (HYDRODIURIL) 25 MG tablet Take 25 mg  by mouth daily.    Historical Provider, MD  insulin glargine (LANTUS) 100 UNIT/ML injection Inject 22 Units into the skin daily.     Historical Provider, MD  IRBESARTAN PO Take 1 tablet by mouth daily. Marland Kitchen    Historical Provider, MD  LORazepam (ATIVAN) 1 MG tablet Take 1 mg by mouth every 8 (eight) hours as needed for anxiety.     Historical Provider, MD  omeprazole (PRILOSEC) 20 MG capsule Take 20 mg by mouth daily.    Historical Provider, MD  simvastatin (ZOCOR) 10 MG tablet Take 10 mg by mouth daily.    Historical Provider, MD  VITAMIN D, CHOLECALCIFEROL, PO Take 1 capsule by mouth daily.    Historical Provider, MD    Family  History Family History  Problem Relation Age of Onset  . Heart disease Father     Social History Social History  Substance Use Topics  . Smoking status: Former Smoker    Types: Cigarettes    Quit date: 04/23/2003  . Smokeless tobacco: Never Used  . Alcohol use No     Allergies   Review of patient's allergies indicates no known allergies.   Review of Systems Review of Systems  All other systems reviewed and are negative.    Physical Exam Updated Vital Signs BP 147/74 (BP Location: Left Arm)   Pulse (!) 123   Temp 99.8 F (37.7 C) (Oral)   Resp 18   Ht 5' 4"  (1.626 m)   Wt 84.8 kg   SpO2 94%   BMI 32.10 kg/m   Physical Exam  Constitutional: She appears well-developed and well-nourished.  HENT:  Head: Normocephalic and atraumatic.  Mouth/Throat: Oropharynx is clear and moist.  Several erythematous lesions on the eyebrows bilaterally.  No drainage.  No surrounding erythema, edema, or warmth  Eyes: EOM are normal. Pupils are equal, round, and reactive to light.  Neck: Normal range of motion. Neck supple.  No nuchal rigidity  Cardiovascular: Normal rate, regular rhythm and normal heart sounds.   Pulmonary/Chest: Effort normal and breath sounds normal.  Musculoskeletal: Normal range of motion.  Neurological: She is alert. She has normal strength. No cranial nerve deficit or sensory deficit. Coordination normal. GCS eye subscore is 4. GCS verbal subscore is 5. GCS motor subscore is 6.  Normal finger to nose testing, no dysmetria  Skin: Skin is warm and dry.  Psychiatric: She has a normal mood and affect.  Nursing note and vitals reviewed.    ED Treatments / Results  Labs (all labs ordered are listed, but only abnormal results are displayed) Labs Reviewed  BASIC METABOLIC PANEL - Abnormal; Notable for the following:       Result Value   Potassium 2.7 (*)    CO2 21 (*)    Glucose, Bld 185 (*)    All other components within normal limits  CBG MONITORING, ED -  Abnormal; Notable for the following:    Glucose-Capillary 184 (*)    All other components within normal limits  CBC  URINALYSIS, ROUTINE W REFLEX MICROSCOPIC (NOT AT [facility])    EKG  EKG Interpretation  Date/Time:  Saturday July 03 2016 16:23:22 EDT Ventricular Rate:  118 PR Interval:    QRS Duration: 89 QT Interval:  313 QTC Calculation: 439 R Axis:   8 Text Interpretation:  Sinus tachycardia Ventricular premature complex SINCE LAST TRACING HEART RATE HAS INCREASED Confirmed by Winfred Leeds  MD, SAM [phone]) on 07/03/2016 4:27:30 PM       Radiology  No results found.  Procedures Procedures (including critical care time)  Medications Ordered in ED Medications  sodium chloride 0.9 % bolus 1,000 mL (not administered)  potassium chloride SA (K-DUR,KLOR-CON) CR tablet 60 mEq (not administered)  potassium chloride 10 mEq in 100 mL IVPB (not administered)     Initial Impression / Assessment and Plan / ED Course  I have reviewed the triage vital signs and the nursing notes.  Pertinent labs & imaging results that were available during my care of the patient were reviewed by me and considered in my medical decision making (see chart for details).  Clinical Course    Final Clinical Impressions(s) / ED Diagnoses   Final diagnoses:  Cough   Patient presents today with a variety of symptoms including headache, dizziness, generalized weakness, and fever.  She was initially tachycardic, but tachycardia resolved after given IVF.  Fever responded to Tylenol.  Headache and dizziness improved after IVF.  Lactate WNL.  CXR is negative.  Suspect viral illness.  Patient able to ambulate at time of discharge without difficulty.  Stable for discharge.  Return precautions given.  Patient also evaluated by Dr Venora Maples who is in agreement with the plan.     New Prescriptions New Prescriptions   No medications on file     Hyman Bible, PA-C 07/04/16 [place], MD 07/05/16  [phone]

## 2016-07-03 NOTE — ED Notes (Signed)
Bed: HT30 Expected date:  Expected time:  Means of arrival:  Comments: Ems-FEVER/mrsa

## 2016-07-03 NOTE — ED Triage Notes (Signed)
Per EMS, patient complaining of generalized weakness and pain since yesterday. Patient took temperature at home and it was 101.8.  Patient has had cellulitis (staph infection) over the past 6 months; it began to resolve however over the past week it has began to come back. Patient on baclofen for infection. Patient had orthostatic changes with EMS. Patient received 300 mL of NS and 1000 mg tylenol PO en route with EMS. Patient coming from home.

## 2017-10-08 ENCOUNTER — Ambulatory Visit: Payer: Self-pay | Admitting: Family Medicine

## 2017-10-13 ENCOUNTER — Encounter: Payer: Self-pay | Admitting: Family Medicine

## 2017-10-13 ENCOUNTER — Ambulatory Visit: Payer: BLUE CROSS/BLUE SHIELD | Admitting: Family Medicine

## 2017-10-13 ENCOUNTER — Telehealth: Payer: Self-pay | Admitting: *Deleted

## 2017-10-13 ENCOUNTER — Other Ambulatory Visit: Payer: Self-pay

## 2017-10-13 VITALS — BP 112/86 | HR 105 | Temp 98.6°F | Resp 16 | Ht 64.0 in | Wt 180.8 lb

## 2017-10-13 DIAGNOSIS — I10 Essential (primary) hypertension: Secondary | ICD-10-CM | POA: Diagnosis not present

## 2017-10-13 DIAGNOSIS — E119 Type 2 diabetes mellitus without complications: Secondary | ICD-10-CM | POA: Diagnosis not present

## 2017-10-13 DIAGNOSIS — R42 Dizziness and giddiness: Secondary | ICD-10-CM | POA: Diagnosis not present

## 2017-10-13 DIAGNOSIS — Z5181 Encounter for therapeutic drug level monitoring: Secondary | ICD-10-CM

## 2017-10-13 DIAGNOSIS — K7581 Nonalcoholic steatohepatitis (NASH): Secondary | ICD-10-CM

## 2017-10-13 DIAGNOSIS — E669 Obesity, unspecified: Secondary | ICD-10-CM

## 2017-10-13 LAB — POCT GLYCOSYLATED HEMOGLOBIN (HGB A1C): Hemoglobin A1C: 9.3

## 2017-10-13 MED ORDER — INSULIN GLARGINE 300 UNIT/ML ~~LOC~~ SOPN: 50.0000 [IU] | PEN_INJECTOR | SUBCUTANEOUS | 1 refills | Status: DC

## 2017-10-13 MED ORDER — FLUTICASONE PROPIONATE 50 MCG/ACT NA SUSP: 2.0000 | Freq: Every day | NASAL | 5 refills | Status: DC | PRN

## 2017-10-13 MED ORDER — IRBESARTAN 150 MG PO TABS: 150.0000 mg | ORAL_TABLET | Freq: Every day | ORAL | 0 refills | Status: DC

## 2017-10-13 MED ORDER — EXENATIDE ER 2 MG/0.85ML ~~LOC~~ AUIJ: 2.0000 mg | AUTO-INJECTOR | SUBCUTANEOUS | 3 refills | Status: DC

## 2017-10-13 NOTE — Progress Notes (Addendum)
Subjective:  By signing my name below, I, Jeanette Rose, attest that this documentation has been prepared under the direction and in the presence of Jeanette Cheadle, MD. Electronically Signed: Moises Rose, [place]. 10/13/2017 , 2:37 PM .  Patient was seen in Room 1 .   Patient ID: Jeanette Rose, female    DOB: 02/22/1956, 62 y.o.   MRN: [id number] Chief Complaint  Patient presents with  . Establish Rose  . Medication Refill    K-DUR   HPI Jeanette Rose is a 62 y.o. female who presents to [facility] to establish Rose, as well as medication refill. She was previously followed by Dr. Delfina Rose. She is fasting today.   Patient occasionally checks her Rose sugars, usually running in the 200s. She states her Rose sugar became poor after losing her job, and then applied for disability. And because she didn't have an income, she went to the [employer] and was eating more starches. She's been able to stay on her medications. Her lowest sugar was 166 today. She usually doesn't check her sugars during the day. She doesn't remember her last A1C. Her brother takes mealtime insulin.   She takes Irbesartan for HTN; recently checked, BP was 160/80. Today, her BP was 112/86.   Her psychiatrist, Dr. Toy Rose, had changed her Cymbalta to Prozac, which helped lower he sugar, but also made her more nervous. She was switched back to Cymbalta.   Patient also mentions having pain in her RUQ, describes as sharp and stabbing, that started since her gall bladder surgery in July 2015. It's becoming more frequent. She also mentions having bilateral painful Morton's neuroma on her feet. She's failed injections by podiatry, and keeps her awake at night.   Past Medical History:  Diagnosis Date  . Allergy   . Anemia   . Anxiety   . Depression   . Diabetes mellitus without complication ([place])   . Fibromyalgia   . GERD (gastroesophageal reflux disease)   . Hypertension   . Obesity (BMI 30-39.9)    Past  Surgical History:  Procedure Laterality Date  . CHOLECYSTECTOMY N/A 04/03/2014   Procedure: LAPAROSCOPIC CHOLECYSTECTOMY ;  Surgeon: Jeanette Hector, MD;  Location: [facility];  Service: General;  Laterality: N/A;   Prior to Admission medications   Medication Sig Start Date End Date Taking? Authorizing Provider  acetaminophen (TYLENOL) 500 MG tablet Take 1,000 mg by mouth every 6 (six) hours as needed for moderate pain.    [provider]  azithromycin (ZITHROMAX) 250 MG tablet Take 1 tablet (250 mg total) by mouth daily. Take first 2 tablets together, then 1 every day until finished. 09/21/14   Charlann Lange, PA-C  cetirizine (ZYRTEC) 10 MG tablet Take 10 mg by mouth daily.    [provider]  chlorpheniramine-HYDROcodone (TUSSIONEX PENNKINETIC ER) 10-8 MG/5ML LQCR Take 5 mLs by mouth every 12 (twelve) hours as needed for cough. 09/21/14   Charlann Lange, PA-C  Cyanocobalamin (VITAMIN B-12 CR PO) Take 1 tablet by mouth daily.    [provider]  DULoxetine (CYMBALTA) 60 MG capsule Take 120 mg by mouth daily.    [provider]  fluticasone (FLONASE) 50 MCG/ACT nasal spray Place 2 sprays into the nose daily as needed for allergies.     [provider]  glyBURIDE-metformin (GLUCOVANCE) 1.25-250 MG per tablet Take 1 tablet by mouth daily with breakfast.    [provider]  hydrochlorothiazide (HYDRODIURIL) 25 MG tablet Take 25 mg  by mouth daily.    [provider]  insulin glargine (LANTUS) 100 UNIT/ML injection Inject 22 Units into the skin daily.     [provider]  IRBESARTAN PO Take 1 tablet by mouth daily. .    [provider]  LORazepam (ATIVAN) 1 MG tablet Take 1 mg by mouth every 8 (eight) hours as needed for anxiety.     [provider]  omeprazole (PRILOSEC) 20 MG capsule Take 20 mg by mouth daily.    [provider]  potassium chloride SA (K-DUR,KLOR-CON) 20 MEQ tablet Take 2 tablets (40 mEq  total) by mouth daily. 07/03/16   Hyman Bible, PA-C  simvastatin (ZOCOR) 10 MG tablet Take 10 mg by mouth daily.    [provider]  VITAMIN D, CHOLECALCIFEROL, PO Take 1 capsule by mouth daily.    [provider]   No Known Allergies Family History  Problem Relation Age of Onset  . Heart disease Father    Social History   Socioeconomic History  . Marital status: Divorced    Spouse name: None  . Number of children: None  . Years of education: None  . Highest education level: None  Social Needs  . Financial resource strain: None  . Food insecurity - worry: None  . Food insecurity - inability: None  . Transportation needs - medical: None  . Transportation needs - non-medical: None  Occupational History  . None  Tobacco Use  . Smoking status: Former Smoker    Types: Cigarettes    Last attempt to quit: 04/23/2003    Years since quitting: 14.4  . Smokeless tobacco: Never Used  Substance and Sexual Activity  . Alcohol use: No  . Drug use: No  . Sexual activity: No  Other Topics Concern  . None  Social History Narrative  . None   Depression screen [facility] 2/9 10/13/2017  Decreased Interest 3  Down, Depressed, Hopeless 1  PHQ - 2 Score 4  Altered sleeping 1  Tired, decreased energy 3  Change in appetite 0  Feeling bad or failure about yourself  0  Trouble concentrating 1  Moving slowly or fidgety/restless 0  Suicidal thoughts 0  PHQ-9 Score 9    Review of Systems  Constitutional: Negative for chills, fatigue, fever and unexpected weight change.  Respiratory: Negative for cough.   Gastrointestinal: Negative for constipation, diarrhea, nausea and vomiting.  Skin: Negative for rash and wound.  Neurological: Negative for dizziness, weakness and headaches.       Objective:   Physical Exam  Constitutional: She is oriented to person, place, and time. She appears well-developed and well-nourished. No distress.  HENT:  Head: Normocephalic and  atraumatic.  Eyes: EOM are normal. Pupils are equal, round, and reactive to light.  Neck: Neck supple.  Cardiovascular: Normal rate.  Pulmonary/Chest: Effort normal and breath sounds normal. No respiratory distress.  Musculoskeletal: Normal range of motion.  Neurological: She is alert and oriented to person, place, and time.  Skin: Skin is warm and dry.  Psychiatric: She has a normal mood and affect. Her behavior is normal.  Nursing note and vitals reviewed.   BP 112/86 (BP Location: Left Arm, Patient Position: Sitting, Cuff Size: Large)   Pulse (!) 105   Temp 98.6 F (37 C) (Oral)   Resp 16   Ht 5' 4"  (1.626 m)   Wt 180 lb 12.8 oz (82 kg)   SpO2 98%   BMI 31.03 kg/m   Results for orders  placed or performed in visit on 10/13/17  POCT glycosylated hemoglobin (Hb A1C)  Result Value Ref Range   Hemoglobin A1C 9.3         Assessment & Plan:   1. Diabetes mellitus without complication ([place]) - new pt to me. Uncontrolled, agrees to start GLP1. Increase tujeo from 46 to 50u. Continue metformin ER 728m qd - no h/o trouble tolerating  Metformin so will discuss increasing this at next OV  2. Steatohepatitis - pt d/o abd pain at times and h/o liver cyst with acute acalculus cholecystitis. Do not see any imaging actually showing fatty liver - all done around 03/2014 when acutely ill with cholecystitis - will likely want to repeat abd [place]- discuss at f/u  3. Essential hypertension   4. Obesity (BMI 30-39.9)   5. Medication monitoring encounter   6. Dizziness and giddiness - when bending for sev years - same with occ for int RUQ pain for sev yrs. There are other unrelated non-urgent complaints, but due to the busy schedule and the amount of time I've already spent with her, time does not permit me to address these routine issues at today's visit. I've requested another appointment to review these additional issues.   ADDENDUM: Do CBC at f/u and full iron panel. Was pt taking any vit B12 or  iron supp? If not taking any B12, consider checking mma and homocysteine.  Orders Placed This Encounter  Procedures  . Comprehensive metabolic panel  . Microalbumin/Creatinine Ratio, Urine  . Lipid panel    Order Specific Question:   Has the patient fasted?    Answer:   Yes  . Vitamin B12  . VITAMIN D 25 Hydroxy (Vit-D Deficiency, Fractures)  . Ferritin  . TSH  . Comprehensive metabolic panel  . Lipid panel  . POCT glycosylated hemoglobin (Hb A1C)  . HM DIABETES FOOT EXAM    Meds ordered this encounter  Medications  . DISCONTD: irbesartan (AVAPRO) 150 MG tablet    Sig: Take 1 tablet (150 mg total) by mouth daily. .    Dispense:  90 tablet    Refill:  0  . DISCONTD: Exenatide ER (BYDUREON BCISE) 2 MG/0.85ML AUIJ    Sig: Inject 2 mg into the skin once a week.    Dispense:  4 pen    Refill:  3  . DISCONTD: Insulin Glargine (TOUJEO MAX SOLOSTAR) 300 UNIT/ML SOPN    Sig: Inject 50 Units into the skin every morning.    Dispense:  15 mL    Refill:  1  . DISCONTD: fluticasone (FLONASE) 50 MCG/ACT nasal spray    Sig: Place 2 sprays into both nostrils daily as needed for allergies.    Dispense:  16 g    Refill:  5  . fluticasone (FLONASE) 50 MCG/ACT nasal spray    Sig: Place 2 sprays into both nostrils daily as needed for allergies.    Dispense:  16 g    Refill:  5  . Insulin Glargine (TOUJEO MAX SOLOSTAR) 300 UNIT/ML SOPN    Sig: Inject 50 Units into the skin every morning.    Dispense:  15 mL    Refill:  1  . irbesartan (AVAPRO) 150 MG tablet    Sig: Take 1 tablet (150 mg total) by mouth daily. .    Dispense:  90 tablet    Refill:  0  . Exenatide ER (BYDUREON BCISE) 2 MG/0.85ML AUIJ    Sig: Inject 2 mg into the skin once  a week.    Dispense:  4 pen    Refill:  3    I personally performed the services described in this documentation, which was scribed in my presence. The recorded information has been reviewed and considered, and addended by me as needed.   Jeanette Rose,  M.D.  [facility] [address] [place], [place] [address] [phone] phone [phone] fax  10/16/17 11:15 AM

## 2017-10-13 NOTE — Patient Instructions (Addendum)
   IF you received an x-ray today, you will receive an invoice from [facility]. Please contact [facility] at [phone] with questions or concerns regarding your invoice.   IF you received labwork today, you will receive an invoice from [facility]. Please contact [facility] at [phone] with questions or concerns regarding your invoice.   Our billing staff will not be able to assist you with questions regarding bills from these companies.  You will be contacted with the lab results as soon as they are available. The fastest way to get your results is to activate your My Chart account. Instructions are located on the last page of this paperwork. If you have not heard from us regarding the results in 2 weeks, please contact this office.     Blood Glucose Monitoring, Adult Monitoring your blood sugar (glucose) helps you manage your diabetes. It also helps you and your health care provider determine how well your diabetes management plan is working. Blood glucose monitoring involves checking your blood glucose as often as directed, and keeping a record (log) of your results over time. Why should I monitor my blood glucose? Checking your blood glucose regularly can:  Help you understand how food, exercise, illnesses, and medicines affect your blood glucose.  Let you know what your blood glucose is at any time. You can quickly tell if you are having low blood glucose (hypoglycemia) or high blood glucose (hyperglycemia).  Help you and your health care provider adjust your medicines as needed.  When should I check my blood glucose? Follow instructions from your health care provider about how often to check your blood glucose. This may depend on:  The type of diabetes you have.  How well-controlled your diabetes is.  Medicines you are taking.  If you have type 1 diabetes:  Check your blood glucose at least 2 times a day.  Also check your blood glucose: ? Before  every insulin injection. ? Before and after exercise. ? Between meals. ? 2 hours after a meal. ? Occasionally between 2:00 a.m. and 3:00 a.m., as directed. ? Before potentially dangerous tasks, like driving or using heavy machinery. ? At bedtime.  You may need to check your blood glucose more often, up to 6-10 times a day: ? If you use an insulin pump. ? If you need multiple daily injections (MDI). ? If your diabetes is not well-controlled. ? If you are ill. ? If you have a history of severe hypoglycemia. ? If you have a history of not knowing when your blood glucose is getting low (hypoglycemia unawareness). If you have type 2 diabetes:  If you take insulin or other diabetes medicines, check your blood glucose at least 2 times a day.  If you are on intensive insulin therapy, check your blood glucose at least 4 times a day. Occasionally, you may also need to check between 2:00 a.m. and 3:00 a.m., as directed.  Also check your blood glucose: ? Before and after exercise. ? Before potentially dangerous tasks, like driving or using heavy machinery.  You may need to check your blood glucose more often if: ? Your medicine is being adjusted. ? Your diabetes is not well-controlled. ? You are ill. What is a blood glucose log?  A blood glucose log is a record of your blood glucose readings. It helps you and your health care provider: ? Look for patterns in your blood glucose over time. ? Adjust your diabetes management plan as needed.  Every time you check   your blood glucose, write down your result and notes about things that may be affecting your blood glucose, such as your diet and exercise for the day.  Most glucose meters store a record of glucose readings in the meter. Some meters allow you to download your records to a computer. How do I check my blood glucose? Follow these steps to get accurate readings of your blood glucose: Supplies needed   Blood glucose meter.  Test  strips for your meter. Each meter has its own strips. You must use the strips that come with your meter.  A needle to prick your finger (lancet). Do not use lancets more than once.  A device that holds the lancet (lancing device).  A journal or log book to write down your results. Procedure  Wash your hands with soap and water.  Prick the side of your finger (not the tip) with the lancet. Use a different finger each time.  Gently rub the finger until a small drop of blood appears.  Follow instructions that come with your meter for inserting the test strip, applying blood to the strip, and using your blood glucose meter.  Write down your result and any notes. Alternative testing sites  Some meters allow you to use areas of your body other than your finger (alternative sites) to test your blood.  If you think you may have hypoglycemia, or if you have hypoglycemia unawareness, do not use alternative sites. Use your finger instead.  Alternative sites may not be as accurate as the fingers, because blood flow is slower in these areas. This means that the result you get may be delayed, and it may be different from the result that you would get from your finger.  The most common alternative sites are: ? Forearm. ? Thigh. ? Palm of the hand. Additional tips  Always keep your supplies with you.  If you have questions or need help, all blood glucose meters have a 24-hour "hotline" number that you can call. You may also contact your health care provider.  After you use a few boxes of test strips, adjust (calibrate) your blood glucose meter by following instructions that came with your meter. This information is not intended to replace advice given to you by your health care provider. Make sure you discuss any questions you have with your health care provider. Document Released: 09/16/2003 Document Revised: 04/02/2016 Document Reviewed: 02/23/2016 Elsevier Interactive Patient Education   2017 Elsevier Inc.  

## 2017-10-13 NOTE — Telephone Encounter (Signed)
Called [facility] spoke to [facility]) to cancel Rxs ordered today, because patient wanted them sent to [facility] on [address].

## 2017-10-14 LAB — COMPREHENSIVE METABOLIC PANEL
ALT: 57 IU/L — ABNORMAL HIGH (ref 0–32)
AST: 58 IU/L — ABNORMAL HIGH (ref 0–40)
Albumin/Globulin Ratio: 1.6 (ref 1.2–2.2)
Albumin: 4.7 g/dL (ref 3.6–4.8)
Alkaline Phosphatase: 108 IU/L (ref 39–117)
BUN/Creatinine Ratio: 14 (ref 12–28)
BUN: 11 mg/dL (ref 8–27)
Bilirubin Total: 0.6 mg/dL (ref 0.0–1.2)
CALCIUM: 9.9 mg/dL (ref 8.7–10.3)
CO2: 23 mmol/L (ref 20–29)
Chloride: 99 mmol/L (ref 96–106)
Creatinine, Ser: 0.8 mg/dL (ref 0.57–1.00)
GFR, EST AFRICAN AMERICAN: 93 mL/min/{1.73_m2} (ref >59)
GFR, EST NON AFRICAN AMERICAN: 80 mL/min/{1.73_m2} (ref >59)
GLUCOSE: 232 mg/dL — AB (ref 65–99)
Globulin, Total: 2.9 g/dL (ref 1.5–4.5)
Potassium: 3.8 mmol/L (ref 3.5–5.2)
Sodium: 140 mmol/L (ref 134–144)
TOTAL PROTEIN: 7.6 g/dL (ref 6.0–8.5)

## 2017-10-14 LAB — LIPID PANEL
CHOL/HDL RATIO: 4.6 ratio — AB (ref 0.0–4.4)
Cholesterol, Total: 226 mg/dL — ABNORMAL HIGH (ref 100–199)
HDL: 49 mg/dL (ref >39)
LDL Calculated: 127 mg/dL — ABNORMAL HIGH (ref 0–99)
TRIGLYCERIDES: 250 mg/dL — AB (ref 0–149)
VLDL CHOLESTEROL CAL: 50 mg/dL — AB (ref 5–40)

## 2017-10-14 LAB — FERRITIN: Ferritin: 377 ng/mL — ABNORMAL HIGH (ref 15–150)

## 2017-10-14 LAB — VITAMIN B12: Vitamin B-12: >2000 pg/mL — ABNORMAL HIGH (ref 232–1245)

## 2017-10-14 LAB — VITAMIN D 25 HYDROXY (VIT D DEFICIENCY, FRACTURES): Vit D, 25-Hydroxy: 10.6 ng/mL — ABNORMAL LOW (ref 30.0–100.0)

## 2017-10-14 LAB — MICROALBUMIN / CREATININE URINE RATIO
Creatinine, Urine: 192.1 mg/dL
Microalb/Creat Ratio: 21 mg/g creat (ref 0.0–30.0)
Microalbumin, Urine: 40.3 ug/mL

## 2017-10-14 LAB — TSH: TSH: 4.09 u[IU]/mL (ref 0.450–4.500)

## 2017-10-17 ENCOUNTER — Telehealth: Payer: Self-pay | Admitting: Family Medicine

## 2017-10-17 NOTE — Telephone Encounter (Signed)
Copied from [place] [phone]. Topic: Quick Communication - See Telephone Encounter >> Oct 17, 2017  9:28 AM Cleaster Corin, NT wrote: CRM for notification. See Telephone encounter for:   10/17/17. Pt. Calling to see id there Is something else that can be called in for her. Pt. Went to pick up her medication Bydureon Bcise and her insurance doesn't cover it pt. Would have to pay 826.00. Pt. Cant afford that. Would like for nurse or Dr. Brigitte Pulse to call her to let her know what to do. Pt. Can be reached at [phone]. Pt. Also wants to know if she has already signed the paper work for release of medical records.

## 2017-10-18 MED ORDER — DULAGLUTIDE 0.75 MG/0.5ML ~~LOC~~ SOAJ: 0.7500 mg | SUBCUTANEOUS | 3 refills | Status: DC

## 2017-10-18 NOTE — Telephone Encounter (Signed)
Pt needs different medication for Bydureon, see not below

## 2017-10-18 NOTE — Telephone Encounter (Signed)
Sent in Trulicity instead.  Please tell pt to go to their website (the Copy) to download a coupon to reduce her copay (since will prob be on the highest tier).  This is the same type of med, used the same way, we are doing a "trial and error" approach to figuring out what is on her insurance formulary.   Med records - pt wants to know if she already signed the paperwork so we can get her records from Dr. Alfonso Patten Pol[facility]'s office [facility]).

## 2017-10-19 ENCOUNTER — Telehealth: Payer: Self-pay | Admitting: Family Medicine

## 2017-10-19 ENCOUNTER — Other Ambulatory Visit: Payer: Self-pay

## 2017-10-19 MED ORDER — DULAGLUTIDE 0.75 MG/0.5ML ~~LOC~~ SOAJ: 0.7500 mg | SUBCUTANEOUS | 3 refills | Status: DC

## 2017-10-19 NOTE — Telephone Encounter (Signed)
Pt advised.

## 2017-10-19 NOTE — Telephone Encounter (Signed)
Message to Dr. Brigitte Pulse.   Note: Metformin is not on pt's MAR???

## 2017-10-19 NOTE — Telephone Encounter (Signed)
[facility] corrected. [facility]

## 2017-10-19 NOTE — Telephone Encounter (Signed)
Patient called in to ask does she need to continue taking the Metformin since she is to start taking Trulicity. I read over the note of her last OV and there was no indication to stop, so I told the patient she will be notified with an answer to her question, she verbalized understanding.

## 2017-10-19 NOTE — Telephone Encounter (Signed)
°  Relation to pt: self  Call back number: [phone] Pharmacy:  [id number] [place] [place]  Reason for call:  Patient requesting Dulaglutide (TRULICITY) 06.57MXU/3.8BFSOPN, please send to  [id number] [place], [place] - [address]

## 2017-10-20 NOTE — Telephone Encounter (Addendum)
Yes, continue on metformin. Let me know if she needs a refill.  Got her medical records from [facility] today.    Did she start on gabapentin 100 qid the day before our visit? Who was it rx'd by? (pulled into her chart from the pharmacy on the outside med reconciliation).

## 2017-10-20 NOTE — Telephone Encounter (Signed)
lmvm to call us back

## 2017-10-21 NOTE — Addendum Note (Signed)
Addended by: Shawnee Knapp on: 10/21/2017 10:30 AM   Modules accepted: Orders

## 2017-10-24 NOTE — Telephone Encounter (Signed)
Lm for pt to cb.

## 2017-10-25 NOTE — Telephone Encounter (Signed)
Perfect. Thank you!

## 2017-10-25 NOTE — Telephone Encounter (Signed)
Call from pt.  Confirmed she is taking Metformin ER 760m once daily.  Does not need refill at this time.  Was taking Gabapentin 1042mQID as prescribed by Dr. 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  Pt has a f/u with Dr. KaToy Caren a few days and will see if she could restart Gabapentin at lower dose.

## 2017-10-26 ENCOUNTER — Encounter: Payer: Self-pay | Admitting: Family Medicine

## 2017-10-26 DIAGNOSIS — G4733 Obstructive sleep apnea (adult) (pediatric): Secondary | ICD-10-CM | POA: Insufficient documentation

## 2017-10-26 DIAGNOSIS — R7989 Other specified abnormal findings of blood chemistry: Secondary | ICD-10-CM | POA: Insufficient documentation

## 2017-10-26 DIAGNOSIS — F331 Major depressive disorder, recurrent, moderate: Secondary | ICD-10-CM | POA: Insufficient documentation

## 2017-10-26 DIAGNOSIS — E785 Hyperlipidemia, unspecified: Secondary | ICD-10-CM | POA: Insufficient documentation

## 2017-10-26 DIAGNOSIS — Z8639 Personal history of other endocrine, nutritional and metabolic disease: Secondary | ICD-10-CM | POA: Insufficient documentation

## 2017-10-26 DIAGNOSIS — E1142 Type 2 diabetes mellitus with diabetic polyneuropathy: Secondary | ICD-10-CM | POA: Insufficient documentation

## 2017-10-26 DIAGNOSIS — E559 Vitamin D deficiency, unspecified: Secondary | ICD-10-CM | POA: Insufficient documentation

## 2017-10-26 DIAGNOSIS — F339 Major depressive disorder, recurrent, unspecified: Secondary | ICD-10-CM | POA: Insufficient documentation

## 2017-10-26 MED ORDER — VITAMIN D (ERGOCALCIFEROL) 1.25 MG (50000 UNIT) PO CAPS: 50000.0000 [IU] | ORAL_CAPSULE | ORAL | 0 refills | Status: DC

## 2017-10-26 MED ORDER — ATORVASTATIN CALCIUM 40 MG PO TABS: 40.0000 mg | ORAL_TABLET | Freq: Every day | ORAL | 0 refills | Status: DC

## 2017-10-26 NOTE — Addendum Note (Signed)
Addended by: Shawnee Knapp on: 10/26/2017 09:42 PM   Modules accepted: Orders

## 2017-11-03 ENCOUNTER — Ambulatory Visit: Payer: BLUE CROSS/BLUE SHIELD | Admitting: Family Medicine

## 2017-11-03 ENCOUNTER — Encounter: Payer: Self-pay | Admitting: Family Medicine

## 2017-11-03 ENCOUNTER — Other Ambulatory Visit: Payer: Self-pay

## 2017-11-03 VITALS — BP 128/82 | HR 95 | Temp 98.4°F | Resp 18 | Ht 64.0 in | Wt 180.0 lb

## 2017-11-03 DIAGNOSIS — Z794 Long term (current) use of insulin: Secondary | ICD-10-CM

## 2017-11-03 DIAGNOSIS — R7989 Other specified abnormal findings of blood chemistry: Secondary | ICD-10-CM | POA: Diagnosis not present

## 2017-11-03 DIAGNOSIS — Z1211 Encounter for screening for malignant neoplasm of colon: Secondary | ICD-10-CM

## 2017-11-03 DIAGNOSIS — E1165 Type 2 diabetes mellitus with hyperglycemia: Secondary | ICD-10-CM

## 2017-11-03 DIAGNOSIS — R1011 Right upper quadrant pain: Secondary | ICD-10-CM | POA: Diagnosis not present

## 2017-11-03 DIAGNOSIS — Z5181 Encounter for therapeutic drug level monitoring: Secondary | ICD-10-CM | POA: Diagnosis not present

## 2017-11-03 LAB — POCT URINALYSIS DIP (MANUAL ENTRY)
BILIRUBIN UA: NEGATIVE mg/dL
Bilirubin, UA: NEGATIVE
Blood, UA: NEGATIVE
Glucose, UA: NEGATIVE mg/dL
LEUKOCYTES UA: NEGATIVE
Nitrite, UA: NEGATIVE
PH UA: 5.5 (ref 5.0–8.0)
Protein Ur, POC: NEGATIVE mg/dL
Spec Grav, UA: 1.01 (ref 1.010–1.025)
Urobilinogen, UA: 1 E.U./dL

## 2017-11-03 LAB — POC MICROSCOPIC URINALYSIS (UMFC): Mucus: ABSENT

## 2017-11-03 MED ORDER — POLYETHYLENE GLYCOL 3350 17 GM/SCOOP PO POWD: 15.0000 g | Freq: Every day | ORAL | 5 refills | Status: DC

## 2017-11-03 NOTE — Patient Instructions (Addendum)
IF you received an x-ray today, you will receive an invoice from [facility]. Please contact [facility] at [phone] with questions or concerns regarding your invoice.   IF you received labwork today, you will receive an invoice from [facility]. Please contact [facility] at [phone] with questions or concerns regarding your invoice.   Our billing staff will not be able to assist you with questions regarding bills from these companies.  You will be contacted with the lab results as soon as they are available. The fastest way to get your results is to activate your My Chart account. Instructions are located on the last page of this paperwork. If you have not heard from [place] regarding the results in 2 weeks, please contact this office.    Liver Function Tests Liver function tests are blood tests to see how well your liver is working. The proteins and enzymes measured in the test can alert your health care provider to inflammation, damage, or disease in your liver. It is common to have liver function tests:  During annual physical exams.  When you are taking certain medicines.  If you have liver disease.  If you drink a lot of alcohol.  When you are not feeling well.  When you have other conditions that may affect the liver.  Substances measured may include:  Alanine transaminase (ALT). This is an enzyme in the liver.  Aspartate transaminase (AST). This is an enzyme in the liver, heart, and muscles.  Alkaline phosphatase (ALP). This is a protein in the liver, bile ducts, and bone. It is also in other body tissues.  Total bilirubin. This is a yellow pigment in bile.  Albumin. This is a protein in the liver.  Prothrombin time and international normalized ratio (PT and INR). PT measures the time that it takes for your blood to clot. INR is a calculation of blood clotting time based upon your PT result. It is also calculated based on normal ranges defined by  the laboratory that processed your lab test.  Total protein. This measures two proteins, albumin and globulin, found in the blood.  How do I prepare for this test? How you prepare will depend on which tests are being done and the reason why these tests are being done. You may need to:  Avoid eating for 4-6 hours before the test or as directed by your health care provider.  Stop taking certain medicines prior to your blood test as directed by your health care provider.  What do the results mean? It is your responsibility to obtain your test results. Ask the lab or department performing the test when and how you will get your results. Contact your health care provider to discuss any questions you have about your results. RANGE OF NORMAL VALUES Ranges for normal values may vary among different labs and hospitals. You should always check with your health care provider after having lab work or other tests done to discuss the meaning of your test results and whether your values are considered within normal limits. The following are normal ranges for substances measured in liver function tests: ALT  Infant: may be twice as high as adult values.  Child or adult: 4-36 international units/L at 37C or 4-36 units/L (SI units).  Elderly: may be slightly higher than adult values. AST  Newborn 57-32 days old: 35-140 units/L.  Child under 67 years old: 15-60 units/L.  35-61 years old: 15-50 units/L.  60-37 years old: 10-50 units/L.  12-18 years  old: 10-40 units/L.  Adult: 0-35 units/L or 0-0.58 microkatal/L (SI units).  Elderly: slightly higher than adults. ALP  Child under 59 years old: 85-235 units/L.  34-76 years old: 65-210 units/L.  67-67 years old: 60-300 units/L.  38-59 years old: 30-200 units/L.  Adult: 30-120 units/L or 0.5-2.0 microkatal/L (SI units).  Elderly: slightly higher than adult. Total bilirubin  Newborn: 1.0-12.0 mg/dL or 17.1-205 micromoles/L (SI units).  Adult,  elderly, or child: 0.3-1.0 mg/dL or 5.1-17 micromoles/L. Albumin  Premature infant: 3.0-4.2 g/dL.  Newborn: 3.5-5.4 g/dL.  Infant: 4.4-5.4 g/dL.  Child: 4.0-5.9 g/dL.  Adult or elderly: 3.5-5.0 g/dL or 35-50 g/L (SI units). PT  11.0-12.5 seconds; 85%-100%. INR  0.8-1.1. Total protein  Premature infant: 4.2-7.6 g/dL.  Newborn: 4.6-7.4 g/dL.  Infant: 6.0-6.7 g/dL.  Child: 6.2-8.0 g/dL.  Adult or elderly: 6.4-8.3 g/dL or 64-83 g/L (SI units). MEANING OF RESULTS OUTSIDE NORMAL VALUE RANGES Sometimes test results can be abnormal due to other factors, such as medicines, exercise, or pregnancy. Follow up with your health care provider if you have any questions about test results outside the normal value ranges. ALT  Levels above the normal range, along with other test results, may indicate liver disease. AST  Levels above the normal range, along with other test results, may indicate liver disease. Sometimes levels also increase after burns, surgery, heart attack, muscle damage, or seizure. ALP  Levels above the normal range, along with other test results, may indicate biliary obstruction, diseases of the liver, bone disease, thyroid disease, tumors, fractures, leukemia or lymphoma, or several other conditions. People with blood type O or B may show higher levels after a fatty meal.  Levels below the normal range, along with other test results, may indicate bone and teeth conditions, malnutrition, protein deficiency, or Wilson disease. Total bilirubin  Levels above the normal range, along with other test results, may indicate problems with the liver, gallbladder, or bile ducts. Albumin  Levels above the normal range, along with other test results, may indicate dehydration. They may also be caused by a diet that is high in protein. Sometimes, the band placed around the upper arm during the process of drawing blood can cause the level of this protein in your blood to rise and  give you a result above the normal range.  Levels below the normal range, along with other tests results, may indicate kidney disease, liver disease, or malabsorption of nutrients. PT and INR  Levels above the normal range mean your blood is clotting slower than normal. This may be due to blood disorders, liver disorders, or low levels of vitamin K. Total protein  Levels above the normal range, along with other test results, may be due to infection or other diseases.  Levels below the normal range, along with other test results, may be due to an immune system disorder, bleeding, burns, kidney disorder, liver disease, trouble absorbing or getting enough nutrients, or other conditions that affect the intestines. Talk with your health care provider to discuss your results, treatment options, and if necessary, the need for more tests. Talk with your health care provider if you have any questions about your results. This information is not intended to replace advice given to you by your health care provider. Make sure you discuss any questions you have with your health care provider. Document Released: 10/16/2004 Document Revised: 05/19/2016 Document Reviewed: 01/17/2014 Elsevier Interactive Patient Education  2018 [employer].   Fatty Liver Fatty liver, also called hepatic steatosis or steatohepatitis, is  a condition in which too much fat has built up in your liver cells. The liver removes harmful substances from your bloodstream. It produces fluids your body needs. It also helps your body use and store energy from the food you eat. In many cases, fatty liver does not cause symptoms or problems. It is often diagnosed when tests are being done for other reasons. However, over time, fatty liver can cause inflammation that may lead to more serious liver problems, such as scarring of the liver (cirrhosis). What are the causes? Causes of fatty liver may include:  Drinking too much alcohol.  Poor  nutrition.  Obesity.  Cushing syndrome.  Diabetes.  Hyperlipidemia.  Pregnancy.  Certain drugs.  Poisons.  Some viral infections.  What increases the risk? You may be more likely to develop fatty liver if you:  Abuse alcohol.  Are pregnant.  Are overweight.  Have diabetes.  Have hepatitis.  Have a high triglyceride level.  What are the signs or symptoms? Fatty liver often does not cause any symptoms. In cases where symptoms develop, they can include:  Fatigue.  Weakness.  Weight loss.  Confusion.  Abdominal pain.  Yellowing of your skin and the white parts of your eyes (jaundice).  Nausea and vomiting.  How is this diagnosed? Fatty liver may be diagnosed by:  Physical exam and medical history.  Blood tests.  Imaging tests, such as an ultrasound, CT scan, or MRI.  Liver biopsy. A small sample of liver tissue is removed using a needle. The sample is then looked at under a microscope.  How is this treated? Fatty liver is often caused by other health conditions. Treatment for fatty liver may involve medicines and lifestyle changes to manage conditions such as:  Alcoholism.  High cholesterol.  Diabetes.  Being overweight or obese.  Follow these instructions at home:  Eat a healthy diet as directed by your health care provider.  Exercise regularly. This can help you lose weight and control your cholesterol and diabetes. Talk to your health care provider about an exercise plan and which activities are best for you.  Do not drink alcohol.  Take medicines only as directed by your health care provider. Contact a health care provider if: You have difficulty controlling your:  Blood sugar.  Cholesterol.  Alcohol consumption.  Get help right away if:  You have abdominal pain.  You have jaundice.  You have nausea and vomiting. This information is not intended to replace advice given to you by your health care provider. Make sure you  discuss any questions you have with your health care provider. Document Released: 10/29/2005 Document Revised: 02/19/2016 Document Reviewed: 01/23/2014 Elsevier Interactive Patient Education  2018 [employer]. Ferritin Test Why am I having this test? The ferritin test is performed to determine if you have anemia due to iron deficiency. The test provides an indication of how much iron you have stored in your body. Decreases in serum ferritin levels often come before other signs of iron deficiency. What kind of sample is taken? A blood sample is required for this test. It is usually collected by inserting a needle into a vein. How do I prepare for this test? There is no preparation or fasting required for this test. What are the reference ranges? Reference ranges are considered healthy ranges established after testing a large group of healthy people. Reference ranges may vary among different people, labs, and hospitals. It is your responsibility to obtain your test results. Ask the lab or  department performing the test when and how you will get your results. The reference ranges for the ferritin test are as follows:  Males: 12-300 ng/mL or 12-300 mcg/L (SI units).  Females: 10-150 ng/mL or 10-150 mcg/L (SI units).  Children or adolescents: ? Newborn: 25-200 ng/mL. ? Less than or equal to 61 month old: 200-600 ng/mL. ? 2-5 months old: 50-200 ng/mL. ? 6 months to 61 years old: 7-142 ng/mL.  What do the results mean? Levels above the reference ranges may indicate:  Anemia due to causes other than iron deficiency.  Inflammatory diseases.  Certain types of cancer.  Disorders that cause iron overload in the body.  Levels below the reference ranges may indicate iron deficiency anemia. Talk with your health care provider to discuss your results, treatment options, and if necessary, the need for more tests. Talk with your health care provider if you have any questions about your  results. Talk with your health care provider to discuss your results, treatment options, and if necessary, the need for more tests. Talk with your health care provider if you have any questions about your results. This information is not intended to replace advice given to you by your health care provider. Make sure you discuss any questions you have with your health care provider. Document Released: 10/06/2004 Document Revised: 05/18/2016 Document Reviewed: 02/08/2014 Elsevier Interactive Patient Education  [employer].

## 2017-11-03 NOTE — Progress Notes (Signed)
Subjective:  By signing my name below, I, [name], attest that this documentation has been prepared under the direction and in the presence of Delman Cheadle, MD Electronically Signed: Ladene Artist, ED Scribe 11/03/2017 at 12:29 PM .   Patient ID: Jeanette Rose, female    DOB: May 11, 1956, 62 y.o.   MRN: [id number]  Chief Complaint  Patient presents with  . Review Labs  . Flank Pain    Pt states pain isn't any better and still hurts.  . Follow-up  . Depression    Depression scale score 9   HPI Jeanette Rose is a 62 y.o. female who presents to Primary Care at [facility] for f/u. Pt is still having RUQ pain but is now having swelling over the area as well which she states is new. States pain occasionaly feels similar to a muscle spasm that makes her double over before self-resolving. Reports pain and nausea before/after eating. She has been having RUQ pain since cholecystectomy in 2015. Pt reports chronic constipation; states she typically has 1 BM/wk. She is not using a stool softener or laxative but has tried Miralax in the past. Pt has never had a colonoscopy.  Pt reports fasting blood glucose of 139 twice, 125 yesterday and 108 today. She was able to start Trulicity last wk; just finished her third dose. Currently on Metformin XR 750 mg qd; states bid dosing caused nausea in the past.  Pt is taking a B complex and Vit D. Denies taking an iron supplement.  She is currently weaning off Cymbalta to Trintellix. Depression screen [facility] 2/9 11/03/2017 10/13/2017  Decreased Interest 1 3  Down, Depressed, Hopeless 0 1  PHQ - 2 Score 1 4  Altered sleeping 0 1  Tired, decreased energy 2 3  Change in appetite 3 0  Feeling bad or failure about yourself  1 0  Trouble concentrating 1 1  Moving slowly or fidgety/restless 1 0  Suicidal thoughts 0 0  PHQ-9 Score 9 9   Past Medical History:  Diagnosis Date  . Acute acalculous cholecystitis s/p lap chole 04/03/2014 04/02/2014  . Allergy   . Anemia   .  Anxiety   . Asthma   . Depression   . Fatty liver    prior records from [place] was noted on CT  . Fibromyalgia   . GERD (gastroesophageal reflux disease)   . Hyperlipidemia associated with type 2 diabetes mellitus ([place])   . Hypertension   . Hypothyroid 2015   Transiently in 2015. Synthroid 42mg was stopped 05/07/2016 w/ tsh nml following.  . Obesity (BMI 30-39.9)   . OSA (obstructive sleep apnea)    was non-compliant with CPAP  . Stroke ([facility]    TIA  . [place]    Current Outpatient Medications on File Prior to Visit  Medication Sig Dispense Refill  . acetaminophen (TYLENOL) 500 MG tablet Take 1,000 mg by mouth every 6 (six) hours as needed for moderate pain.    .Marland Kitchenatorvastatin (LIPITOR) 40 MG tablet Take 1 tablet (40 mg total) by mouth daily. 90 tablet 0  . cetirizine (ZYRTEC) 10 MG tablet Take 10 mg by mouth daily.    . Cyanocobalamin (VITAMIN B-12 CR PO) Take 1 tablet by mouth daily.    . Dulaglutide (TRULICITY) 05.05MWP/7.9YISOPN Inject 0.75 mg into the skin once a week. 4 pen 3  . DULoxetine (CYMBALTA) 30 MG capsule Take 90 mg by mouth daily.  0  . fluticasone (  FLONASE) 50 MCG/ACT nasal spray Place 2 sprays into both nostrils daily as needed for allergies. 16 g 5  . gabapentin (NEURONTIN) 100 MG capsule Take 100 mg by mouth 4 (four) times daily.  3  . Insulin Glargine (TOUJEO MAX SOLOSTAR) 300 UNIT/ML SOPN Inject 50 Units into the skin every morning. 15 mL 1  . irbesartan (AVAPRO) 150 MG tablet Take 1 tablet (150 mg total) by mouth daily. . 90 tablet 0  . Lancets (FREESTYLE) lancets 1 Units by Does not apply route 4 (four) times daily.    Marland Kitchen LORazepam (ATIVAN) 1 MG tablet Take 1 mg by mouth every 8 (eight) hours as needed for anxiety.     . metFORMIN (GLUCOPHAGE-XR) 750 MG 24 hr tablet Take 1 tablet by mouth daily with supper.  3  . omeprazole (PRILOSEC) 20 MG capsule Take 20 mg by mouth daily.    . simvastatin (ZOCOR) 20 MG tablet Take 20 mg by  mouth daily.  3  . VITAMIN D, CHOLECALCIFEROL, PO Take 1 capsule by mouth daily.    . Vitamin D, Ergocalciferol, (DRISDOL) 50000 units CAPS capsule Take 1 capsule (50,000 Units total) by mouth every 7 (seven) days. 24 capsule 0  . DULoxetine (CYMBALTA) 60 MG capsule Take 120 mg by mouth daily.     No current facility-administered medications on file prior to visit.    No Known Allergies   Past Surgical History:  Procedure Laterality Date  . BREAST REDUCTION SURGERY  06/2008  . CHOLECYSTECTOMY N/A 04/03/2014   Procedure: LAPAROSCOPIC CHOLECYSTECTOMY ;  Surgeon: Adin Hector, MD;  Location: [facility];  Service: General;  Laterality: N/A;  . TUBAL LIGATION     Family History  Problem Relation Age of Onset  . Atrial fibrillation Mother   . Heart disease Father        CAD  . Diabetes Brother   . Cancer Maternal Grandmother   . Diabetes Paternal Grandmother   . Fibromyalgia Sister   . Colitis Sister    Social History   Socioeconomic History  . Marital status: Divorced    Spouse name: None  . Number of children: None  . Years of education: None  . Highest education level: None  Social Needs  . Financial resource strain: None  . Food insecurity - worry: None  . Food insecurity - inability: None  . Transportation needs - medical: None  . Transportation needs - non-medical: None  Occupational History  . None  Tobacco Use  . Smoking status: Former Smoker    Types: Cigarettes    Last attempt to quit: 04/22/2004    Years since quitting: 13.5  . Smokeless tobacco: Never Used  . Tobacco comment: 4 pack year hx  Substance and Sexual Activity  . Alcohol use: No  . Drug use: No  . Sexual activity: No  Other Topics Concern  . None  Social History Narrative  . None    Review of Systems  Gastrointestinal: Positive for abdominal pain, constipation (chronic) and nausea.  Psychiatric/Behavioral: Positive for dysphoric mood.      Objective:   Physical Exam  Constitutional: She  is oriented to person, place, and time. She appears well-developed and well-nourished. No distress.  HENT:  Head: Normocephalic and atraumatic.  Eyes: Conjunctivae and EOM are normal.  Neck: Neck supple. No tracheal deviation present.  Cardiovascular: Normal rate and regular rhythm.  Pulmonary/Chest: Effort normal and breath sounds normal. No respiratory distress.  Abdominal: Bowel sounds are normal. There is  no hepatosplenomegaly or splenomegaly. There is tenderness in the right upper quadrant and epigastric area. There is no rebound, no guarding and negative Murphy's sign.  Tenderness to palpation in the epigastrium and RUQ.  Musculoskeletal: Normal range of motion.  Neurological: She is alert and oriented to person, place, and time.  Skin: Skin is warm and dry.  Psychiatric: She has a normal mood and affect. Her behavior is normal.  Nursing note and vitals reviewed.  Vitals:   11/03/17 1159  BP: 128/82  Pulse: 95  Resp: 18  Temp: 98.4 F (36.9 C)  TempSrc: Oral  SpO2: 97%  Weight: 180 lb (81.6 kg)  Height: 5' 4"  (1.626 m)      Assessment & Plan:   1. Elevated ferritin level   2. Right upper quadrant abdominal pain   3. Type 2 diabetes mellitus with hyperglycemia, with long-term current use of insulin ([place])   4. Medication monitoring encounter   5. Special screening for malignant neoplasms, colon    Seeing Dr. Toy Care for depression - medication is being adjusted.  Orders Placed This Encounter  Procedures  . US Abdomen Limited RUQ    Epic order  Ins-  Wt     Standing Status:   Future    Standing Expiration Date:   01/02/2019    Order Specific Question:   Reason for Exam (SYMPTOM  OR DIAGNOSIS REQUIRED)    Answer:   RUQ abdominal pain, colicky x 4 yrs, ?h/o hepatic steatosis    Order Specific Question:   Preferred imaging location?    Answer:   [facility]  . CBC with Differential/Platelet  . Ferritin  . Iron and TIBC([facility])  . Lipase  .  C-peptide  . Sedimentation Rate  . C-reactive protein  . Comprehensive metabolic panel  . CBC with Differential/Platelet  . Iron and TIBC  . Ferritin  . Ambulatory referral to Gastroenterology    Referral Priority:   Routine    Referral Type:   Consultation    Referral Reason:   Specialty Services Required    Number of Visits Requested:   1  . POCT urinalysis dipstick  . POCT Microscopic Urinalysis ([facility])    Meds ordered this encounter  Medications  . polyethylene glycol powder (GLYCOLAX/MIRALAX) powder    Sig: Take 15-30 g by mouth daily.    Dispense:  500 g    Refill:  5    I personally performed the services described in this documentation, which was scribed in my presence. The recorded information has been reviewed and considered, and addended by me as needed.   Delman Cheadle, M.D.  Primary Care at [facility] [address] [place], [place] [address] [phone] phone [phone] fax  11/06/17 11:05 AM

## 2017-11-04 LAB — COMPREHENSIVE METABOLIC PANEL
A/G RATIO: 1.7 (ref 1.2–2.2)
ALBUMIN: 4.5 g/dL (ref 3.6–4.8)
ALT: 45 IU/L — ABNORMAL HIGH (ref 0–32)
AST: 46 IU/L — ABNORMAL HIGH (ref 0–40)
Alkaline Phosphatase: 104 IU/L (ref 39–117)
BILIRUBIN TOTAL: 0.5 mg/dL (ref 0.0–1.2)
BUN / CREAT RATIO: 9 — AB (ref 12–28)
BUN: 7 mg/dL — AB (ref 8–27)
CHLORIDE: 102 mmol/L (ref 96–106)
CO2: 25 mmol/L (ref 20–29)
Calcium: 9.8 mg/dL (ref 8.7–10.3)
Creatinine, Ser: 0.74 mg/dL (ref 0.57–1.00)
GFR, EST AFRICAN AMERICAN: 102 mL/min/{1.73_m2} (ref >59)
GFR, EST NON AFRICAN AMERICAN: 88 mL/min/{1.73_m2} (ref >59)
GLUCOSE: 139 mg/dL — AB (ref 65–99)
Globulin, Total: 2.7 g/dL (ref 1.5–4.5)
Potassium: 3.8 mmol/L (ref 3.5–5.2)
Sodium: 142 mmol/L (ref 134–144)
Total Protein: 7.2 g/dL (ref 6.0–8.5)

## 2017-11-04 LAB — CBC WITH DIFFERENTIAL/PLATELET
BASOS ABS: 0.1 10*3/uL (ref 0.0–0.2)
BASOS: 1 %
EOS (ABSOLUTE): 0.5 10*3/uL — ABNORMAL HIGH (ref 0.0–0.4)
Eos: 6 %
HEMATOCRIT: 44.6 % (ref 34.0–46.6)
HEMOGLOBIN: 14.6 g/dL (ref 11.1–15.9)
Immature Grans (Abs): 0 10*3/uL (ref 0.0–0.1)
Immature Granulocytes: 0 %
LYMPHS ABS: 2.7 10*3/uL (ref 0.7–3.1)
Lymphs: 33 %
MCH: 30 pg (ref 26.6–33.0)
MCHC: 32.7 g/dL (ref 31.5–35.7)
MCV: 92 fL (ref 79–97)
MONOCYTES: 5 %
Monocytes Absolute: 0.4 10*3/uL (ref 0.1–0.9)
NEUTROS ABS: 4.6 10*3/uL (ref 1.4–7.0)
Neutrophils: 55 %
Platelets: 242 10*3/uL (ref 150–379)
RBC: 4.86 x10E6/uL (ref 3.77–5.28)
RDW: 13.8 % (ref 12.3–15.4)
WBC: 8.3 10*3/uL (ref 3.4–10.8)

## 2017-11-04 LAB — IRON AND TIBC
IRON SATURATION: 21 % (ref 15–55)
Iron: 69 ug/dL (ref 27–159)
TIBC: 325 ug/dL (ref 250–450)
UIBC: 256 ug/dL (ref 131–425)

## 2017-11-04 LAB — LIPASE: LIPASE: 23 U/L (ref 14–72)

## 2017-11-04 LAB — C-PEPTIDE: C-Peptide: 5 ng/mL — ABNORMAL HIGH (ref 1.1–4.4)

## 2017-11-04 LAB — C-REACTIVE PROTEIN: CRP: 4.1 mg/L (ref 0.0–4.9)

## 2017-11-04 LAB — SEDIMENTATION RATE: Sed Rate: 10 mm/hr (ref 0–40)

## 2017-11-04 LAB — FERRITIN: Ferritin: 291 ng/mL — ABNORMAL HIGH (ref 15–150)

## 2017-11-08 ENCOUNTER — Other Ambulatory Visit: Payer: Self-pay | Admitting: Family Medicine

## 2017-11-11 ENCOUNTER — Encounter: Payer: Self-pay | Admitting: Family Medicine

## 2017-11-14 ENCOUNTER — Other Ambulatory Visit: Payer: Self-pay | Admitting: Family Medicine

## 2017-11-17 ENCOUNTER — Ambulatory Visit
Admission: RE | Admit: 2017-11-17 | Discharge: 2017-11-17 | Disposition: A | Discharge status: Home / Self Care | Payer: BLUE CROSS/BLUE SHIELD | Source: Ambulatory Visit | Attending: Family Medicine | Admitting: Family Medicine

## 2017-11-17 DIAGNOSIS — R1011 Right upper quadrant pain: Secondary | ICD-10-CM

## 2017-11-17 DIAGNOSIS — R7989 Other specified abnormal findings of blood chemistry: Secondary | ICD-10-CM

## 2017-11-24 ENCOUNTER — Ambulatory Visit (INDEPENDENT_AMBULATORY_CARE_PROVIDER_SITE_OTHER): Payer: BLUE CROSS/BLUE SHIELD

## 2017-11-24 ENCOUNTER — Ambulatory Visit (INDEPENDENT_AMBULATORY_CARE_PROVIDER_SITE_OTHER): Payer: BLUE CROSS/BLUE SHIELD | Admitting: Family Medicine

## 2017-11-24 ENCOUNTER — Other Ambulatory Visit: Payer: Self-pay

## 2017-11-24 ENCOUNTER — Encounter: Payer: Self-pay | Admitting: Family Medicine

## 2017-11-24 VITALS — BP 124/80 | HR 107 | Temp 98.4°F | Resp 18 | Ht 64.0 in | Wt 179.4 lb

## 2017-11-24 DIAGNOSIS — R7989 Other specified abnormal findings of blood chemistry: Secondary | ICD-10-CM

## 2017-11-24 DIAGNOSIS — K7581 Nonalcoholic steatohepatitis (NASH): Secondary | ICD-10-CM

## 2017-11-24 DIAGNOSIS — R1084 Generalized abdominal pain: Secondary | ICD-10-CM

## 2017-11-24 DIAGNOSIS — K5900 Constipation, unspecified: Secondary | ICD-10-CM

## 2017-11-24 DIAGNOSIS — Z119 Encounter for screening for infectious and parasitic diseases, unspecified: Secondary | ICD-10-CM

## 2017-11-24 DIAGNOSIS — N898 Other specified noninflammatory disorders of vagina: Secondary | ICD-10-CM

## 2017-11-24 DIAGNOSIS — I10 Essential (primary) hypertension: Secondary | ICD-10-CM

## 2017-11-24 LAB — POC MICROSCOPIC URINALYSIS (UMFC): MUCUS RE: ABSENT

## 2017-11-24 LAB — POCT URINALYSIS DIP (MANUAL ENTRY)
Blood, UA: NEGATIVE
Glucose, UA: 100 mg/dL — AB
Leukocytes, UA: NEGATIVE
Nitrite, UA: NEGATIVE
PH UA: 6 (ref 5.0–8.0)
Spec Grav, UA: >=1.03 — AB (ref 1.010–1.025)
UROBILINOGEN UA: 4 U/dL — AB

## 2017-11-24 LAB — POCT WET + KOH PREP
Trich by wet prep: ABSENT
YEAST BY KOH: ABSENT
YEAST BY WET PREP: ABSENT

## 2017-11-24 MED ORDER — FLUCONAZOLE 150 MG PO TABS: 150.0000 mg | ORAL_TABLET | Freq: Once | ORAL | 0 refills | Status: AC

## 2017-11-24 MED ORDER — OLMESARTAN MEDOXOMIL 20 MG PO TABS: 20.0000 mg | ORAL_TABLET | Freq: Every day | ORAL | 1 refills | Status: DC

## 2017-11-24 NOTE — Patient Instructions (Addendum)
Use a glycerin suppository at least twice a day until the oral medications start causing frequent bowel movement. Drink a bottle of magnesium citrate today.   Also follow this by starting the miralax clean-out starting today. Put 14 (not kidding) doses of miralax (polyethylene glycol) into 64 oz of any clear, non-carbonated liquid (apple juice, gatorade, water) and drink this WITHIN 24 HOURS!!!! For the next 2d, plan to stay home and just hang around the toilet as you will hopefully be having 8 BM/day of LIQUID stool.  If the diarrhea occurs soon after starting process without passing a significant stool volume or if you are having any fecal incontinence you should keep going as this may be the initial miralax washing around the larger stools. Then continue to take 1 dose of miralax EVERY DAY.  IF you received an x-ray today, you will receive an invoice from [facility]. Please contact [facility] at [phone] with questions or concerns regarding your invoice.   IF you received labwork today, you will receive an invoice from [place]. Please contact [facility] at [phone] with questions or concerns regarding your invoice.   Our billing staff will not be able to assist you with questions regarding bills from these companies.  You will be contacted with the lab results as soon as they are available. The fastest way to get your results is to activate your My Chart account. Instructions are located on the last page of this paperwork. If you have not heard from [place] regarding the results in 2 weeks, please contact this office.      Constipation, Adult Constipation is when a person has fewer bowel movements in a week than normal, has difficulty having a bowel movement, or has stools that are dry, hard, or larger than normal. Constipation may be caused by an underlying condition. It may become worse with age if a person takes certain medicines and does not take in enough  fluids. Follow these instructions at home: Eating and drinking   Eat foods that have a lot of fiber, such as fresh fruits and vegetables, whole grains, and beans.  Limit foods that are high in fat, low in fiber, or overly processed, such as french fries, hamburgers, cookies, candies, and soda.  Drink enough fluid to keep your urine clear or pale yellow. General instructions  Exercise regularly or as told by your health care provider.  Go to the restroom when you have the urge to go. Do not hold it in.  Take over-the-counter and prescription medicines only as told by your health care provider. These include any fiber supplements.  Practice pelvic floor retraining exercises, such as deep breathing while relaxing the lower abdomen and pelvic floor relaxation during bowel movements.  Watch your condition for any changes.  Keep all follow-up visits as told by your health care provider. This is important. Contact a health care provider if:  You have pain that gets worse.  You have a fever.  You do not have a bowel movement after 4 days.  You vomit.  You are not hungry.  You lose weight.  You are bleeding from the anus.  You have thin, pencil-like stools. Get help right away if:  You have a fever and your symptoms suddenly get worse.  You leak stool or have blood in your stool.  Your abdomen is bloated.  You have severe pain in your abdomen.  You feel dizzy or you faint. This information is not intended to replace advice given to you  by your health care provider. Make sure you discuss any questions you have with your health care provider. Document Released: 06/11/2004 Document Revised: 04/02/2016 Document Reviewed: 03/03/2016 Elsevier Interactive Patient Education  2018 [employer].

## 2017-11-24 NOTE — Progress Notes (Addendum)
Subjective:  By signing my name below, I, Jeanette Rose, attest that this documentation has been prepared under the direction and in the presence of Jeanette Cheadle, MD Electronically Signed: Ladene Rose, ED Scribe 11/24/2017 at 5:10 PM.   Patient ID: Jeanette Rose, female    DOB: 04-20-1956, 62 y.o.   MRN: [id number]  Chief Complaint  Patient presents with  . Labs    Pt is here to review labs and [place]  . Medication Management    Pt states new meds are working fine and not having any complications. Pt states she needs an replacement for Avapro because meds are on backorder   HPI Jeanette Rose is a 62 y.o. female who presents to Primary Care at [facility] to discuss results.  Abdominal Pain Pt reports that she is now experiencing occasional L-sided abdominal pain but states that she took her insulin injection on the L today. She also reports fatigue, nausea which is exacerbated with eating and abdominal bloating x 6 months. She does report that she has not eaten today due to decreased appetite. Pt states "it feels like a tight band is wrapped around and constricting my stomach when I'm sitting on the couch". She is not taking Miralax daily but reports that her last BM was yesterday. Last pap 04/2016.  DM Reports blood glucose readings in the 100s. Denies hypoglycemic episodes.  Rash Pt reports recurrent rash to her face. Reports h/o staph infection on face.  GU Pt reports recurrent urinary symptoms which includes malodor, vaginal itching and dark brown/grey vaginal discharge on pads. She was started on a medication which she states never fully resolved symptoms.  Rx Refill Pt requests a replacement for Avapro. States the medication is on backorder so she needs a replacement.  Past Medical History:  Diagnosis Date  . Acute acalculous cholecystitis s/p lap chole 04/03/2014 04/02/2014  . Allergy   . Anemia   . Anxiety   . Asthma   . Depression   . Fatty liver    prior records from [place] was noted on CT  . Fibromyalgia   . GERD (gastroesophageal reflux disease)   . Hyperlipidemia associated with type 2 diabetes mellitus ([place])   . Hypertension   . Hypothyroid 2015   Transiently in 2015. Synthroid 77mg was stopped 05/07/2016 w/ tsh nml following.  . Obesity (BMI 30-39.9)   . OSA (obstructive sleep apnea)    was non-compliant with CPAP  . Stroke ([facility]    TIA  . Type 2 diabetes mellitus ([facility]    Current Outpatient Medications on File Prior to Visit  Medication Sig Dispense Refill  . aspirin (ASPIRIN LOW DOSE) 81 MG tablet     . atorvastatin (LIPITOR) 40 MG tablet Take 1 tablet (40 mg total) by mouth daily. 90 tablet 0  . cetirizine (ZYRTEC) 10 MG tablet Take 10 mg by mouth daily.    . Cyanocobalamin (VITAMIN B-12 CR PO) Take 1 tablet by mouth daily.    . Dulaglutide (TRULICITY) 01.17MBV/6.7OLSOPN Inject 0.75 mg into the skin once a week. 4 pen 3  . DULoxetine (CYMBALTA) 30 MG capsule Take 90 mg by mouth daily.  0  . fluticasone (FLONASE) 50 MCG/ACT nasal spray Place 2 sprays into both nostrils daily as needed for allergies. 16 g 5  . gabapentin (NEURONTIN) 100 MG capsule Take 100 mg by mouth 4 (four) times daily.  3  . Insulin Glargine (TOUJEO MAX SOLOSTAR) 300 UNIT/ML SOPN Inject 50 Units into  the skin every morning. 15 mL 1  . irbesartan (AVAPRO) 150 MG tablet TAKE 1 TABLET BY MOUTH EVERY DAY 90 tablet 2  . LORazepam (ATIVAN) 1 MG tablet Take 1 mg by mouth every 8 (eight) hours as needed for anxiety.     . metFORMIN (GLUCOPHAGE-XR) 750 MG 24 hr tablet Take 1 tablet by mouth daily with supper.  3  . omeprazole (PRILOSEC) 20 MG capsule Take 20 mg by mouth daily.    . polyethylene glycol powder (GLYCOLAX/MIRALAX) powder Take 15-30 g by mouth daily. 500 g 5  . VITAMIN D, CHOLECALCIFEROL, PO Take 1 capsule by mouth daily.    . Vitamin D, Ergocalciferol, (DRISDOL) 50000 units CAPS capsule Take 1 capsule (50,000 Units total) by mouth every 7 (seven) days. 24 capsule 0  .  acetaminophen (TYLENOL) 500 MG tablet Take 1,000 mg by mouth every 6 (six) hours as needed for moderate pain.    . Lancets (FREESTYLE) lancets 1 Units by Does not apply route 4 (four) times daily.     No current facility-administered medications on file prior to visit.    No Known Allergies   Past Surgical History:  Procedure Laterality Date  . BREAST REDUCTION SURGERY  06/2008  . CHOLECYSTECTOMY N/A 04/03/2014   Procedure: LAPAROSCOPIC CHOLECYSTECTOMY ;  Surgeon: Adin Hector, MD;  Location: [facility];  Service: General;  Laterality: N/A;  . TUBAL LIGATION     Family History  Problem Relation Age of Onset  . Atrial fibrillation Mother   . Heart disease Father        CAD  . Diabetes Brother   . Cancer Maternal Grandmother   . Diabetes Paternal Grandmother   . Fibromyalgia Sister   . Colitis Sister    Social History   Socioeconomic History  . Marital status: Divorced    Spouse name: None  . Number of children: None  . Years of education: None  . Highest education level: None  Social Needs  . Financial resource strain: None  . Food insecurity - worry: None  . Food insecurity - inability: None  . Transportation needs - medical: None  . Transportation needs - non-medical: None  Occupational History  . None  Tobacco Use  . Smoking status: Former Smoker    Types: Cigarettes    Last attempt to quit: 04/22/2004    Years since quitting: 13.6  . Smokeless tobacco: Never Used  . Tobacco comment: 4 pack year hx  Substance and Sexual Activity  . Alcohol use: No  . Drug use: No  . Sexual activity: No  Other Topics Concern  . None  Social History Narrative  . None   Depression screen [facility] 2/9 11/03/2017 10/13/2017  Decreased Interest 1 3  Down, Depressed, Hopeless 0 1  PHQ - 2 Score 1 4  Altered sleeping 0 1  Tired, decreased energy 2 3  Change in appetite 3 0  Feeling bad or failure about yourself  1 0  Trouble concentrating 1 1  Moving slowly or fidgety/restless 1 0    Suicidal thoughts 0 0  PHQ-9 Score 9 9    Review of Systems  Constitutional: Positive for appetite change and fatigue.  Gastrointestinal: Positive for abdominal distention, abdominal pain and nausea.  Genitourinary: Positive for vaginal discharge.  Skin: Positive for rash.      Objective:   Physical Exam  Constitutional: She is oriented to person, place, and time. She appears well-developed and well-nourished. No distress.  HENT:  Head:  Normocephalic and atraumatic.  Eyes: Conjunctivae and EOM are normal.  Neck: Neck supple. No tracheal deviation present.  Cardiovascular: Normal rate, regular rhythm and normal heart sounds.  Pulmonary/Chest: Effort normal and breath sounds normal. No respiratory distress.  Abdominal: Bowel sounds are normal. She exhibits distension (moderate). She exhibits no mass. There is tenderness in the left lower quadrant. There is guarding.  Musculoskeletal: Normal range of motion.  Neurological: She is alert and oriented to person, place, and time.  Skin: Skin is warm and dry.  Psychiatric: She has a normal mood and affect. Her behavior is normal.  Nursing note and vitals reviewed.  BP 124/80 (BP Location: Left Arm, Patient Position: Sitting, Cuff Size: Large)   Pulse (!) 107   Temp 98.4 F (36.9 C) (Oral)   Resp 18   Ht 5' 4" (1.626 m)   Wt 179 lb 6.4 oz (81.4 kg)   SpO2 95%   BMI 30.79 kg/m     Results for orders placed or performed in visit on 11/24/17  POCT Wet + KOH Prep  Result Value Ref Range   Yeast by KOH Absent Absent   Yeast by wet prep Absent Absent   WBC by wet prep None (A) Few   Clue Cells Wet Prep HPF POC None None   Trich by wet prep Absent Absent   Bacteria Wet Prep HPF POC Many (A) Few   Epithelial Cells By [employer] Pref ([facility]) Few None, Few, Too numerous to count   RBC,UR,HPF,POC Few (A) None RBC/hpf  POCT urinalysis dipstick  Result Value Ref Range   Color, UA yellow yellow   Clarity, UA clear clear   Glucose, UA =100 (A)  negative mg/dL   Bilirubin, UA moderate (A) negative   Ketones, POC UA trace (5) (A) negative mg/dL   Spec Grav, UA >=1.030 (A) 1.010 - 1.025   Blood, UA negative negative   pH, UA 6.0 5.0 - 8.0   Protein Ur, POC =100 (A) negative mg/dL   Urobilinogen, UA 4.0 (A) 0.2 or 1.0 E.U./dL   Nitrite, UA Negative Negative   Leukocytes, UA Negative Negative  POCT Microscopic Urinalysis ([facility])  Result Value Ref Range   WBC,UR,HPF,POC Few (A) None WBC/hpf   RBC,UR,HPF,POC None None RBC/hpf   Bacteria Many (A) None, Too numerous to count   Mucus Absent Absent   Epithelial Cells, UR Per Microscopy Moderate (A) None, Too numerous to count cells/hpf   Dg Abd 2 Views  Result Date: 11/24/2017 CLINICAL DATA:  Abdominal bloating EXAM: ABDOMEN - 2 VIEW COMPARISON:  Ultrasound 11/17/2017, CT 04/02/2014 FINDINGS: Surgical clips in the right upper quadrant. Tubal ligation clips in the pelvis. No free air beneath the diaphragm. Nonobstructed gas pattern with large amount of stool throughout the colon. IMPRESSION: Nonobstructed gas pattern with large amount of stool throughout the colon Electronically Signed   By: Donavan Foil M.D.   On: 11/24/2017 18:09   US Abdomen Limited Ruq  Result Date: 11/17/2017 CLINICAL DATA:  62 year old female with chronic, colicky right upper quadrant abdominal pain. Elevated ferritin. Prior cholecystectomy. EXAM: ULTRASOUND ABDOMEN LIMITED RIGHT UPPER QUADRANT COMPARISON:  Postoperative abdomen MRI, and preoperative 04/10/2014 04/02/2014. CT Abdomen and Pelvis FINDINGS: Gallbladder: Surgically absent Common bile duct: Diameter: 5 millimeters, normal. Liver: Generalized increased liver echogenicity (image 16). No intrahepatic biliary ductal dilatation. Chronic simple cyst in the anterior right lobe measuring 3.4 centimeters is stable since 2015. No other discrete liver lesion. Portal vein is patent on color Doppler imaging with normal  direction of blood flow towards the liver. Other  findings: Negative visible right kidney. No right upper quadrant free fluid. IMPRESSION: 1. Fatty liver disease. 2. No evidence of biliary obstruction. Electronically Signed   By: Genevie Ann M.D.   On: 11/17/2017 12:00    Assessment & Plan:   1. Elevated ferritin level - unknown etiology - check labs today.  Iron levels were normal - in lower half of normal range.Liver seems to be fine other than some mild fatty liver.  Check hep C and hiv. Suspect it is elevated as it is acute phase reactant due to the inflammatory condition of uncontrolled DM & metabolic syndrome combined with the minimal liver injury from mild NASH. However, other inflammatory markers/acute phase reactants - crp, esr, platelets all normal and AST/ALT elevation is VERy minimal - could consider checking LDH with next labs Level has been trending down as DM has been coming under better control since starting GLP1 and ferritin has not exceeded 2-3x ULN - cont watchful waiting. If starts increasing sig despite continued improvement in NASH and DMII - consider further eval for occult malignancy.  2. Screening examination for infectious disease   3. Generalized abdominal pain - poss due to chronic constipation?  4. Vaginal discharge - self collect wet prep nml but suspect yeast due to sxs and DM - try fluconazole  5. Constipation, unspecified constipation type - start miralax cleanout w/ 14 doses/24 hrs (see AVS) after a mag citrate bottle today and bid glycerine suppositories until things start moving. Start miralax dose every day indefinitely to prevent recurrence.  6.      Essential hypertension - irbesartan 150 on back order so will change to olmesartan 20 - should be equivalent. 7.      Steatohepatitis - seen on RUQ [place] last week, transaminase elevation very mild - I doubt this explains pain as report doesn't note hepatomegaly.  Work on diet and exercise.  Orders Placed This Encounter  Procedures  . Urine Culture  . DG Abd 2 Views     Standing Status:   Future    Number of Occurrences:   1    Standing Expiration Date:   11/24/2018    Order Specific Question:   Reason for Exam (SYMPTOM  OR DIAGNOSIS REQUIRED)    Answer:   abdominal bloating and constipation    Order Specific Question:   Is the patient pregnant?    Answer:   No    Order Specific Question:   Preferred imaging location?    Answer:   External  . HIV antibody  . HCV Ab w/Rflx to Verification  . Ferritin  . CBC with Differential/Platelet  . Comprehensive metabolic panel  . Interpretation:  . POCT Wet + KOH Prep  . POCT urinalysis dipstick  . POCT Microscopic Urinalysis ([facility])    Meds ordered this encounter  Medications  . fluconazole (DIFLUCAN) 150 MG tablet    Sig: Take 1 tablet (150 mg total) by mouth once for 1 dose. Repeat if needed every 3 days    Dispense:  3 tablet    Refill:  0  . olmesartan (BENICAR) 20 MG tablet    Sig: Take 1 tablet (20 mg total) by mouth daily.    Dispense:  90 tablet    Refill:  1    I personally performed the services described in this documentation, which was scribed in my presence. The recorded information has been reviewed and considered, and addended by me as needed.  Jeanette Rose, M.D.  Primary Care at [facility] [address] [place], [place] [address] [phone] phone [phone] fax  11/27/17 9:51 AM

## 2017-11-25 ENCOUNTER — Telehealth: Payer: Self-pay | Admitting: Family Medicine

## 2017-11-25 ENCOUNTER — Other Ambulatory Visit: Payer: Self-pay | Admitting: Family Medicine

## 2017-11-25 DIAGNOSIS — Z1231 Encounter for screening mammogram for malignant neoplasm of breast: Secondary | ICD-10-CM

## 2017-11-25 LAB — COMPREHENSIVE METABOLIC PANEL
A/G RATIO: 1.6 (ref 1.2–2.2)
ALK PHOS: 109 IU/L (ref 39–117)
ALT: 46 IU/L — ABNORMAL HIGH (ref 0–32)
AST: 39 IU/L (ref 0–40)
Albumin: 4.5 g/dL (ref 3.6–4.8)
BILIRUBIN TOTAL: 0.6 mg/dL (ref 0.0–1.2)
BUN / CREAT RATIO: 19 (ref 12–28)
BUN: 14 mg/dL (ref 8–27)
CO2: 26 mmol/L (ref 20–29)
Calcium: 9.5 mg/dL (ref 8.7–10.3)
Chloride: 99 mmol/L (ref 96–106)
Creatinine, Ser: 0.74 mg/dL (ref 0.57–1.00)
GFR calc Af Amer: 102 mL/min/{1.73_m2} (ref >59)
GFR calc non Af Amer: 88 mL/min/{1.73_m2} (ref >59)
GLOBULIN, TOTAL: 2.8 g/dL (ref 1.5–4.5)
Glucose: 171 mg/dL — ABNORMAL HIGH (ref 65–99)
POTASSIUM: 3.1 mmol/L — AB (ref 3.5–5.2)
SODIUM: 142 mmol/L (ref 134–144)
Total Protein: 7.3 g/dL (ref 6.0–8.5)

## 2017-11-25 LAB — CBC WITH DIFFERENTIAL/PLATELET
BASOS: 1 %
Basophils Absolute: 0 10*3/uL (ref 0.0–0.2)
EOS (ABSOLUTE): 0.5 10*3/uL — ABNORMAL HIGH (ref 0.0–0.4)
EOS: 6 %
HEMATOCRIT: 44.2 % (ref 34.0–46.6)
Hemoglobin: 14.8 g/dL (ref 11.1–15.9)
Immature Grans (Abs): 0 10*3/uL (ref 0.0–0.1)
Immature Granulocytes: 0 %
LYMPHS ABS: 3 10*3/uL (ref 0.7–3.1)
Lymphs: 34 %
MCH: 30.6 pg (ref 26.6–33.0)
MCHC: 33.5 g/dL (ref 31.5–35.7)
MCV: 91 fL (ref 79–97)
MONOS ABS: 0.4 10*3/uL (ref 0.1–0.9)
Monocytes: 4 %
NEUTROS ABS: 4.8 10*3/uL (ref 1.4–7.0)
Neutrophils: 55 %
Platelets: 269 10*3/uL (ref 150–379)
RBC: 4.84 x10E6/uL (ref 3.77–5.28)
RDW: 13.9 % (ref 12.3–15.4)
WBC: 8.7 10*3/uL (ref 3.4–10.8)

## 2017-11-25 LAB — HCV INTERPRETATION

## 2017-11-25 LAB — HCV AB W/RFLX TO VERIFICATION

## 2017-11-25 LAB — FERRITIN: Ferritin: 250 ng/mL — ABNORMAL HIGH (ref 15–150)

## 2017-11-25 LAB — HIV ANTIBODY (ROUTINE TESTING W REFLEX): HIV Screen 4th Generation wRfx: NONREACTIVE

## 2017-11-25 NOTE — Telephone Encounter (Signed)
Copied from [place] [phone]. Topic: Quick Communication - See Telephone Encounter >> Nov 25, 2017  3:40 PM Burnis Medin, NT wrote: CRM for notification. See Telephone encounter for: Patient called and said doctor suggested that she have a mammogram and [facility] is saying the doctor needs to send over a diagnostic. Fax number is [phone]   11/25/17.

## 2017-11-26 ENCOUNTER — Other Ambulatory Visit: Payer: Self-pay

## 2017-11-26 DIAGNOSIS — Z9889 Other specified postprocedural states: Secondary | ICD-10-CM

## 2017-11-26 LAB — URINE CULTURE

## 2017-11-26 NOTE — Progress Notes (Signed)
Pt attempted to schedule screening mammogram and was told it had to be diagnostic.  Order placed.

## 2017-11-26 NOTE — Telephone Encounter (Signed)
Order for diagnostic placed

## 2017-11-29 ENCOUNTER — Encounter: Payer: Self-pay | Admitting: Family Medicine

## 2017-11-29 NOTE — Addendum Note (Signed)
Addended by: Shawnee Knapp on: 11/29/2017 09:17 PM   Modules accepted: Orders

## 2017-12-23 NOTE — Telephone Encounter (Signed)
Sarah from [facility] called and stated that pt called them and tried to schedule an appt for her mm but sarah stated that they need to know which breast the pt is having pain in and that the pain needs to be listed so they can have it on file.

## 2017-12-25 NOTE — Telephone Encounter (Signed)
Pt did not mention any breast pain to me at all - this is the first I am hearing of it. I had just wanted her to get her routine screening mammogram done.  Please call pt and get hx of breast pain she apparently told the [facility] about - exact location, how long, constant/intermittent, rash?, nipple discharge?, similar pain prior?, mass?, change in breast appearance or size?  Please advise pt that in the future when she is having any breast issues, she needs to come in for an appointment for eval so we can figure out potential causes and make sure we are doing the correct test/treatments.

## 2017-12-26 NOTE — Telephone Encounter (Signed)
Pt calling back to speak with the office. Please call pt to obtain more details regarding her breast pain.

## 2017-12-26 NOTE — Telephone Encounter (Signed)
Pt states that she has intermittent right sided breast pain going on for several months. Pt states that the pain is worse when she puts pressure on it but only goes up to a 3 for pain. Pt denies, lumps, rash, discharge or unusual size to her breast. She states it has been "some time" since her last mammogram.

## 2017-12-26 NOTE — Telephone Encounter (Signed)
See previous note

## 2017-12-28 ENCOUNTER — Other Ambulatory Visit: Payer: Self-pay | Admitting: *Deleted

## 2017-12-28 ENCOUNTER — Other Ambulatory Visit: Payer: Self-pay | Admitting: Family Medicine

## 2017-12-28 DIAGNOSIS — N644 Mastodynia: Secondary | ICD-10-CM

## 2017-12-30 ENCOUNTER — Encounter: Payer: Self-pay | Admitting: Family Medicine

## 2017-12-30 DIAGNOSIS — E559 Vitamin D deficiency, unspecified: Secondary | ICD-10-CM

## 2017-12-30 DIAGNOSIS — Z780 Asymptomatic menopausal state: Secondary | ICD-10-CM

## 2017-12-30 DIAGNOSIS — Z8639 Personal history of other endocrine, nutritional and metabolic disease: Secondary | ICD-10-CM

## 2017-12-30 DIAGNOSIS — Z13820 Encounter for screening for osteoporosis: Secondary | ICD-10-CM

## 2017-12-30 DIAGNOSIS — E1142 Type 2 diabetes mellitus with diabetic polyneuropathy: Secondary | ICD-10-CM

## 2017-12-30 DIAGNOSIS — Z87891 Personal history of nicotine dependence: Secondary | ICD-10-CM

## 2017-12-30 DIAGNOSIS — E2839 Other primary ovarian failure: Secondary | ICD-10-CM

## 2018-01-02 ENCOUNTER — Encounter: Payer: Self-pay | Admitting: Family Medicine

## 2018-01-03 ENCOUNTER — Telehealth: Payer: Self-pay | Admitting: Family Medicine

## 2018-01-03 DIAGNOSIS — N644 Mastodynia: Secondary | ICD-10-CM

## 2018-01-03 NOTE — Telephone Encounter (Signed)
Order placed. Thank you for the additional info Rudene Christians - very helpful!

## 2018-01-03 NOTE — Telephone Encounter (Signed)
Order placed on next telephone message

## 2018-01-03 NOTE — Telephone Encounter (Signed)
Phone call to patient to gather more information on breast pain.   Onset: She states left breast has been painful the same amount of time painful as right breast.  Location: Left breast, sometimes is painful under her arm. Notes her right side of neck has been painful. Duration: Intermittent. Sudden onset, even while at rest.  Character: She describes as an aching pain. Rates as a 0 on a 0-10 scale currently. Rates pain as a 3 or a 4 when painful. Aggravating/Relief/Treatment: None tried so far. Nothing makes it better/worse. She states pain will come and go quickly before she can try anything.    Denies any concerning lumps, redness, nipple discharge, or changes to the skin on the breast.   Provider, please update orders if agreeable or advise if patient should be seen.

## 2018-01-03 NOTE — Telephone Encounter (Signed)
Copied from [place] [phone]. Topic: Quick Communication - See Telephone Encounter >> Jan 03, 2018 11:59 AM Boyd Kerbs wrote: CRM for notification.   Pt. Called needing diagnostic exam and U/S for both breasts.  Her left one is hurting too.  [facility]  See Telephone encounter for: 01/03/18.

## 2018-01-04 ENCOUNTER — Other Ambulatory Visit: Payer: Self-pay | Admitting: Family Medicine

## 2018-01-04 DIAGNOSIS — N644 Mastodynia: Secondary | ICD-10-CM

## 2018-01-05 ENCOUNTER — Ambulatory Visit (INDEPENDENT_AMBULATORY_CARE_PROVIDER_SITE_OTHER): Payer: BLUE CROSS/BLUE SHIELD | Admitting: Family Medicine

## 2018-01-05 ENCOUNTER — Other Ambulatory Visit: Payer: Self-pay

## 2018-01-05 ENCOUNTER — Encounter: Payer: Self-pay | Admitting: Family Medicine

## 2018-01-05 VITALS — BP 116/66 | HR 90 | Temp 98.1°F | Resp 12 | Ht 64.0 in | Wt 181.6 lb

## 2018-01-05 DIAGNOSIS — N644 Mastodynia: Secondary | ICD-10-CM

## 2018-01-05 DIAGNOSIS — I10 Essential (primary) hypertension: Secondary | ICD-10-CM | POA: Diagnosis not present

## 2018-01-05 DIAGNOSIS — E118 Type 2 diabetes mellitus with unspecified complications: Secondary | ICD-10-CM

## 2018-01-05 DIAGNOSIS — E559 Vitamin D deficiency, unspecified: Secondary | ICD-10-CM

## 2018-01-05 DIAGNOSIS — K5909 Other constipation: Secondary | ICD-10-CM

## 2018-01-05 DIAGNOSIS — Z23 Encounter for immunization: Secondary | ICD-10-CM

## 2018-01-05 DIAGNOSIS — Z1211 Encounter for screening for malignant neoplasm of colon: Secondary | ICD-10-CM

## 2018-01-05 DIAGNOSIS — L219 Seborrheic dermatitis, unspecified: Secondary | ICD-10-CM

## 2018-01-05 DIAGNOSIS — G4733 Obstructive sleep apnea (adult) (pediatric): Secondary | ICD-10-CM

## 2018-01-05 DIAGNOSIS — E785 Hyperlipidemia, unspecified: Secondary | ICD-10-CM

## 2018-01-05 LAB — POCT URINALYSIS DIP (MANUAL ENTRY)
Blood, UA: NEGATIVE
Leukocytes, UA: NEGATIVE
Nitrite, UA: NEGATIVE
Protein Ur, POC: NEGATIVE mg/dL
Urobilinogen, UA: 1 E.U./dL
pH, UA: 5 (ref 5.0–8.0)

## 2018-01-05 LAB — POCT GLYCOSYLATED HEMOGLOBIN (HGB A1C): Hemoglobin A1C: 9.2

## 2018-01-05 LAB — POC MICROSCOPIC URINALYSIS (UMFC): MUCUS RE: ABSENT

## 2018-01-05 LAB — HM DIABETES EYE EXAM

## 2018-01-05 MED ORDER — INSULIN GLARGINE 300 UNIT/ML ~~LOC~~ SOPN: 55.0000 [IU] | PEN_INJECTOR | SUBCUTANEOUS | 3 refills | Status: DC

## 2018-01-05 MED ORDER — ZOSTER VAC RECOMB ADJUVANTED 50 MCG/0.5ML IM SUSR: 0.5000 mL | Freq: Once | INTRAMUSCULAR | 1 refills | Status: AC

## 2018-01-05 MED ORDER — DULAGLUTIDE 1.5 MG/0.5ML ~~LOC~~ SOAJ: 1.5000 mg | SUBCUTANEOUS | 5 refills | Status: DC

## 2018-01-05 MED ORDER — LINACLOTIDE 145 MCG PO CAPS: 145.0000 ug | ORAL_CAPSULE | Freq: Every day | ORAL | 3 refills | Status: DC

## 2018-01-05 NOTE — Progress Notes (Addendum)
Subjective:  By signing my name below, I, Jeanette Rose, attest that this documentation has been prepared under the direction and in the presence of Delman Cheadle, MD Electronically Signed: Ladene Artist, ED Scribe 01/05/2018 at 12:30 PM.   Patient ID: Jeanette Rose, female    DOB: 1956/04/08, 62 y.o.   MRN: [id number]  Chief Complaint  Patient presents with  . Diabetes mellitus without complication ([place])    Follow up, A1c check  . Constipation    Has been taking miralax since Sunday, finally had BM yesterday.   HPI Jeanette Rose is a 62 y.o. female who presents to Primary Care at [facility] for f/u on DM.  DM Pt reports fasting morning blood glucose of 170-174. She reports some nausea and has noticed some occasional red raised area to her skin where she injects the Trulicity - resolve within several days, not worsening, not spreading, not painful or pruritic. Still at 50 units of Toujeo. Denies hypoglycemic lows. Pt could not tolerate high doses of metformin due to nausea. She has not had a pneumonia vaccine.  Chronic Constipation Pt did 14 doses of Miralax in 1 day. States she finally had a BM and didn't take anything for 2 days but still had BMs for 2-3 days. She has been using a cap full daily since for the past 4-5 days and finally had another BM yesterday. Reports intermittent constipation throughout her life. Pt has not had a colonoscopy.  Dermatology Sees Marline Backbone at [facility] for seborrheic dermatitis of her face and scalp. She was prescribed a shampoo which she states provided no relief so she recently ordered another shampoo online. She also uses argon and coconut oil for dry, flaky scalp.  Past Medical History:  Diagnosis Date  . Acute acalculous cholecystitis s/p lap chole 04/03/2014 04/02/2014  . Allergy   . Anemia   . Anxiety   . Asthma   . Depression   . Fatty liver    prior records from [facility] was noted on CT  . Fibromyalgia   . GERD  (gastroesophageal reflux disease)   . Hyperlipidemia associated with type 2 diabetes mellitus ([place])   . Hypertension   . Hypothyroid 2015   Transiently in 2015. Synthroid 64mg was stopped 05/07/2016 w/ tsh nml following.  . Obesity (BMI 30-39.9)   . OSA (obstructive sleep apnea)    was non-compliant with CPAP  . Stroke ([facility]    TIA  . Type 2 diabetes mellitus ([place]    Current Outpatient Medications on File Prior to Visit  Medication Sig Dispense Refill  . aspirin (ASPIRIN LOW DOSE) 81 MG tablet     . atorvastatin (LIPITOR) 40 MG tablet Take 1 tablet (40 mg total) by mouth daily. 90 tablet 0  . cetirizine (ZYRTEC) 10 MG tablet Take 10 mg by mouth daily.    . Cyanocobalamin (VITAMIN B-12 CR PO) Take 1 tablet by mouth daily.    . Dulaglutide (TRULICITY) 04.78MGN/5.6OZSOPN Inject 0.75 mg into the skin once a week. 4 pen 3  . gabapentin (NEURONTIN) 100 MG capsule Take 100 mg by mouth 4 (four) times daily.  3  . Insulin Glargine (TOUJEO MAX SOLOSTAR) 300 UNIT/ML SOPN Inject 50 Units into the skin every morning. 15 mL 1  . Lancets (FREESTYLE) lancets 1 Units by Does not apply route 4 (four) times daily.    .Marland KitchenLORazepam (ATIVAN) 1 MG tablet Take 1 mg by mouth every 8 (eight) hours as needed for anxiety.     .Marland Kitchen  metFORMIN (GLUCOPHAGE-XR) 750 MG 24 hr tablet Take 1 tablet by mouth daily with supper.  3  . olmesartan (BENICAR) 20 MG tablet Take 1 tablet (20 mg total) by mouth daily. 90 tablet 1  . omeprazole (PRILOSEC) 20 MG capsule Take 20 mg by mouth daily.    . ondansetron (ZOFRAN) 4 MG tablet Take 4 mg by mouth every 8 (eight) hours as needed for nausea or vomiting.    . polyethylene glycol powder (GLYCOLAX/MIRALAX) powder Take 15-30 g by mouth daily. 500 g 5  . VITAMIN D, CHOLECALCIFEROL, PO Take 1 capsule by mouth daily.    . Vitamin D, Ergocalciferol, (DRISDOL) 50000 units CAPS capsule Take 1 capsule (50,000 Units total) by mouth every 7 (seven) days. 24 capsule 0  . vortioxetine HBr  (TRINTELLIX) 10 MG TABS tablet Take 10 mg by mouth daily.     No current facility-administered medications on file prior to visit.     Past Surgical History:  Procedure Laterality Date  . BREAST REDUCTION SURGERY  06/2008  . CHOLECYSTECTOMY N/A 04/03/2014   Procedure: LAPAROSCOPIC CHOLECYSTECTOMY ;  Surgeon: Adin Hector, MD;  Location: [facility];  Service: General;  Laterality: N/A;  . REDUCTION MAMMAPLASTY    . TUBAL LIGATION     No Known Allergies Family History  Problem Relation Age of Onset  . Atrial fibrillation Mother   . Heart disease Father        CAD  . Diabetes Brother   . Cancer Maternal Grandmother   . Diabetes Paternal Grandmother   . Fibromyalgia Sister   . Colitis Sister    Social History   Socioeconomic History  . Marital status: Divorced    Spouse name: Not on file  . Number of children: Not on file  . Years of education: Not on file  . Highest education level: Not on file  Occupational History  . Not on file  Social Needs  . Financial resource strain: Not on file  . Food insecurity:    Worry: Not on file    Inability: Not on file  . Transportation needs:    Medical: Not on file    Non-medical: Not on file  Tobacco Use  . Smoking status: Former Smoker    Types: Cigarettes    Last attempt to quit: 04/22/2004    Years since quitting: 13.7  . Smokeless tobacco: Never Used  . Tobacco comment: 4 pack year hx  Substance and Sexual Activity  . Alcohol use: No  . Drug use: No  . Sexual activity: Never  Lifestyle  . Physical activity:    Days per week: Not on file    Minutes per session: Not on file  . Stress: Not on file  Relationships  . Social connections:    Talks on phone: Not on file    Gets together: Not on file    Attends religious service: Not on file    Active member of club or organization: Not on file    Attends meetings of clubs or organizations: Not on file    Relationship status: Not on file  Other Topics Concern  . Not on file    Social History Narrative  . Not on file   Depression screen [facility] 2/9 01/05/2018 11/03/2017 10/13/2017  Decreased Interest 1 1 3   Down, Depressed, Hopeless 0 0 1  PHQ - 2 Score 1 1 4   Altered sleeping - 0 1  Tired, decreased energy - 2 3  Change in appetite -  3 0  Feeling bad or failure about yourself  - 1 0  Trouble concentrating - 1 1  Moving slowly or fidgety/restless - 1 0  Suicidal thoughts - 0 0  PHQ-9 Score - 9 9     Review of Systems  Gastrointestinal: Positive for constipation and nausea.      Objective:   Physical Exam  Constitutional: She is oriented to person, place, and time. She appears well-developed and well-nourished. No distress.  HENT:  Head: Normocephalic and atraumatic.  Right Ear: External ear normal.  Left Ear: External ear normal.  Eyes: Conjunctivae and EOM are normal. No scleral icterus.  Neck: Normal range of motion. Neck supple. No tracheal deviation present. No thyromegaly present.  Cardiovascular: Normal rate, regular rhythm, normal heart sounds and intact distal pulses.  Pulmonary/Chest: Effort normal and breath sounds normal. No respiratory distress. There is breast tenderness (diffuse throughout). No breast swelling, discharge or bleeding.  Musculoskeletal: Normal range of motion. She exhibits no edema.  Lymphadenopathy:       Head (right side): No submandibular, no tonsillar, no preauricular and no posterior auricular adenopathy present.       Head (left side): No submandibular, no tonsillar, no preauricular and no posterior auricular adenopathy present.    She has no cervical adenopathy.    She has no axillary adenopathy.       Right: No supraclavicular adenopathy present.       Left: No supraclavicular adenopathy present.  Neurological: She is alert and oriented to person, place, and time.  Skin: Skin is warm and dry. She is not diaphoretic. No erythema.  Psychiatric: She has a normal mood and affect. Her behavior is normal.  Nursing note  and vitals reviewed.  BP 116/66   Pulse 90   Temp 98.1 F (36.7 C)   Resp 12   Ht 5' 4"  (1.626 m)   Wt 181 lb 9.6 oz (82.4 kg)   SpO2 98%   BMI 31.17 kg/m     Results for orders placed or performed in visit on 01/05/18  POCT glycosylated hemoglobin (Hb A1C)  Result Value Ref Range   Hemoglobin A1C 9.2   POCT urinalysis dipstick  Result Value Ref Range   Color, UA yellow yellow   Clarity, UA clear clear   Glucose, UA =500 (A) negative mg/dL   Bilirubin, UA small (A) negative   Ketones, POC UA trace (5) (A) negative mg/dL   Spec Grav, UA >=1.030 (A) 1.010 - 1.025   Blood, UA negative negative   pH, UA 5.0 5.0 - 8.0   Protein Ur, POC negative negative mg/dL   Urobilinogen, UA 1.0 0.2 or 1.0 E.U./dL   Nitrite, UA Negative Negative   Leukocytes, UA Negative Negative  POCT Microscopic Urinalysis ([facility])  Result Value Ref Range   WBC,UR,HPF,POC Few (A) None WBC/hpf   RBC,UR,HPF,POC None None RBC/hpf   Bacteria None None, Too numerous to count   Mucus Absent Absent   Epithelial Cells, UR Per Microscopy Moderate (A) None, Too numerous to count cells/hpf   EKG: NSR, no acute ischemic changes noted. Significant change noted when compared to prior EKG done 07/03/2016 of now dominant R wave in lead III and aVF but unsure of the significance but today's is more similar to 04/02/14 EKG that also had mainly dominant R wave in III and aVF.   I have personally reviewed the EKG tracing and agree with the computer interpretation: Sinus  Rhythm  WITHIN NORMAL LIMITS  Assessment &  Plan:   1. Type 2 diabetes mellitus with complication, unspecified whether long term insulin use (HCC) - uncontrolled w/o sig improvement since started GLP1 sev mos ago so will increase trulicity from 7.62 to 1.5 qwk.  Increase Toujeo from 50 to 55u qam. cont metformin xr 750 qd (can't increase due to nausea). Lab Results  Component Value Date   HGBA1C 9.2 01/05/2018   HGBA1C 9.3 10/13/2017   HGBA1C 7.9 (H)  04/03/2014    2. Seborrheic dermatitis of scalp - no improvement w/ product derm gave her so trying new otc  3. OSA (obstructive sleep apnea) - not using cpap??  4. Chronic constipation - start trial of linzess as has failed high dose and even daily miralax - suspect it has been causing her recurrent abd pain in variety of locations  5. Screening for colon cancer - none prior - unwilling to do colonoscopy so ordered cologuard  6. Hyperlipidemia LDL goal <100 - changed from simvastatin 20 to atorvastatin 40 about 2 mos ago - fasting today so recheck. - improved but not at goal - recheck in another 2-3 mos and change to atorvastatin 80 if still not at goal  7. Pain of both breasts - has mammogram scheduled for tomorrow - questionable area of thickening on left upper-outer breast - has mammogram tomorrow and recheck again at next OV.  8.      Vitamin D deficiency - correction can help people tolerate statin better - was 10 3 mos prior in Jan so doing once wkly high dose course x 6 mos - recheck ~July to see if need to extend. Cont otc 2000u/d which she is doing chronically. 9.      HTN - doing great on olmesartan 20 (changed last visit from irbesartan 150 which was on backorder)  Will need prevnar-13 in 1 yr as pneumovax-23 done today. Did get prior [facility] records and it was not noted. Needs TDaP. Consider recheck ferritin at next OV  Orders Placed This Encounter  Procedures  . Pneumococcal polysaccharide vaccine 23-valent greater than or equal to 2yo subcutaneous/IM  . Comprehensive metabolic panel  . Cologuard  . Lipid panel    Order Specific Question:   Has the patient fasted?    Answer:   Yes  . Lipid panel  . POCT glycosylated hemoglobin (Hb A1C)  . POCT urinalysis dipstick  . POCT Microscopic Urinalysis ([facility])  . EKG 12-Lead    Meds ordered this encounter  Medications  . linaclotide (LINZESS) 145 MCG CAPS capsule    Sig: Take 1 capsule (145 mcg total) by mouth daily before  breakfast.    Dispense:  30 capsule    Refill:  3  . Dulaglutide (TRULICITY) 1.5 GB/1.5VV SOPN    Sig: Inject 1.5 mg into the skin once a week.    Dispense:  4 pen    Refill:  5  . Insulin Glargine (TOUJEO MAX SOLOSTAR) 300 UNIT/ML SOPN    Sig: Inject 55 Units into the skin every morning.    Dispense:  15 mL    Refill:  3    Dose change  . Zoster Vaccine Adjuvanted [facility]) injection    Sig: Inject 0.5 mLs into the muscle once for 1 dose. Repeat once in 2-6 months    Dispense:  0.5 mL    Refill:  1    I personally performed the services described in this documentation, which was scribed in my presence. The recorded information has been reviewed and considered, and  addended by me as needed.   Delman Cheadle, M.D.  Primary Care at [facility] [address] [place], [place] [address] [phone] phone [phone] fax  01/17/18 11:00 PM

## 2018-01-05 NOTE — Patient Instructions (Addendum)
     IF you received an x-ray today, you will receive an invoice from [facility]. Please contact [facility] at [phone] with questions or concerns regarding your invoice.   IF you received labwork today, you will receive an invoice from [facility]. Please contact [facility] at [phone] with questions or concerns regarding your invoice.   Our billing staff will not be able to assist you with questions regarding bills from these companies.  You will be contacted with the lab results as soon as they are available. The fastest way to get your results is to activate your My Chart account. Instructions are located on the last page of this paperwork. If you have not heard from [place] regarding the results in 2 weeks, please contact this office.     About Constipation  Constipation Overview Constipation is the most common gastrointestinal complaint - about 4 million Americans experience constipation and make 2.5 million physician visits a year to get help for the problem.  Constipation can occur when the colon absorbs too much water, the colon's muscle contraction is slow or sluggish, and/or there is delayed transit time through the colon.  The result is stool that is hard and dry.  Indicators of constipation include straining during bowel movements greater than 25% of the time, having fewer than three bowel movements per week, and/or the feeling of incomplete evacuation.  There are established guidelines ([place] ) for defining constipation. A person needs to have two or more of the following symptoms for at least 12 weeks (not necessarily consecutive) in the preceding 12 months: . Straining in  greater than 25% of bowel movements . Lumpy or hard stools in greater than 25% of bowel movements . Sensation of incomplete emptying in greater than 25% of bowel movements . Sensation of anorectal obstruction/blockade in greater than 25% of bowel movements . Manual maneuvers to help empty  greater than 25% of bowel movements (e.g., digital evacuation, support of the pelvic floor)  . Less than  3 bowel movements/week . Loose stools are not present, and criteria for irritable bowel syndrome are insufficient  Common Causes of Constipation . Lack of fiber in your diet . Lack of physical activity . Medications, including iron and calcium supplements  . Dairy intake . Dehydration . Abuse of laxatives  Travel  Irritable Bowel Syndrome  Pregnancy  Luteal phase of menstruation (after ovulation and before menses)  Colorectal problems  Intestinal Dysfunction  Treating Constipation  There are several ways of treating constipation, including changes to diet and exercise, use of laxatives, adjustments to the pelvic floor, and scheduled toileting.  These treatments include: . increasing fiber and fluids in the diet  . increasing physical activity . learning muscle coordination   learning proper toileting techniques and toileting modifications   designing and sticking  to a toileting schedule     2007, [employer] Doc.22

## 2018-01-06 ENCOUNTER — Telehealth: Payer: Self-pay

## 2018-01-06 ENCOUNTER — Other Ambulatory Visit: Payer: Self-pay | Admitting: Family Medicine

## 2018-01-06 ENCOUNTER — Ambulatory Visit: Payer: BLUE CROSS/BLUE SHIELD

## 2018-01-06 ENCOUNTER — Ambulatory Visit
Admission: RE | Admit: 2018-01-06 | Discharge: 2018-01-06 | Disposition: A | Discharge status: Home / Self Care | Payer: BLUE CROSS/BLUE SHIELD | Source: Ambulatory Visit | Attending: Family Medicine | Admitting: Family Medicine

## 2018-01-06 DIAGNOSIS — Z1231 Encounter for screening mammogram for malignant neoplasm of breast: Secondary | ICD-10-CM

## 2018-01-06 DIAGNOSIS — N644 Mastodynia: Secondary | ICD-10-CM

## 2018-01-06 DIAGNOSIS — N6310 Unspecified lump in the right breast, unspecified quadrant: Secondary | ICD-10-CM

## 2018-01-06 LAB — COMPREHENSIVE METABOLIC PANEL
A/G RATIO: 1.7 (ref 1.2–2.2)
ALBUMIN: 4.5 g/dL (ref 3.6–4.8)
ALT: 52 IU/L — AB (ref 0–32)
AST: 51 IU/L — ABNORMAL HIGH (ref 0–40)
Alkaline Phosphatase: 105 IU/L (ref 39–117)
BUN / CREAT RATIO: 17 (ref 12–28)
BUN: 13 mg/dL (ref 8–27)
Bilirubin Total: 0.5 mg/dL (ref 0.0–1.2)
CALCIUM: 9.6 mg/dL (ref 8.7–10.3)
CHLORIDE: 102 mmol/L (ref 96–106)
CO2: 20 mmol/L (ref 20–29)
Creatinine, Ser: 0.77 mg/dL (ref 0.57–1.00)
GFR, EST AFRICAN AMERICAN: 97 mL/min/{1.73_m2} (ref >59)
GFR, EST NON AFRICAN AMERICAN: 84 mL/min/{1.73_m2} (ref >59)
GLOBULIN, TOTAL: 2.7 g/dL (ref 1.5–4.5)
Glucose: 217 mg/dL — ABNORMAL HIGH (ref 65–99)
POTASSIUM: 4.2 mmol/L (ref 3.5–5.2)
SODIUM: 140 mmol/L (ref 134–144)
Total Protein: 7.2 g/dL (ref 6.0–8.5)

## 2018-01-06 LAB — LIPID PANEL
CHOLESTEROL TOTAL: 191 mg/dL (ref 100–199)
Chol/HDL Ratio: 4 ratio (ref 0.0–4.4)
HDL: 48 mg/dL (ref >39)
LDL Calculated: 113 mg/dL — ABNORMAL HIGH (ref 0–99)
Triglycerides: 152 mg/dL — ABNORMAL HIGH (ref 0–149)
VLDL CHOLESTEROL CAL: 30 mg/dL (ref 5–40)

## 2018-01-06 NOTE — Telephone Encounter (Signed)
Call from [facility].  Pt in clinic to complete breast ultrasound.  States they initially cancelled left side d/t c/o being r/t right side but pt now reports left sided sx as well.  Need new order for left side.  Provided verbal and entered order.  Unable to clarify 04/11 assessment d/t notes not completed.

## 2018-01-11 ENCOUNTER — Other Ambulatory Visit: Payer: Self-pay | Admitting: Family Medicine

## 2018-01-11 NOTE — Telephone Encounter (Signed)
LOV 01/05/18 Dr. Brigitte Pulse

## 2018-01-14 ENCOUNTER — Encounter: Payer: Self-pay | Admitting: Family Medicine

## 2018-01-15 ENCOUNTER — Other Ambulatory Visit: Payer: Self-pay | Admitting: Family Medicine

## 2018-01-16 ENCOUNTER — Encounter: Payer: Self-pay | Admitting: Family Medicine

## 2018-01-17 ENCOUNTER — Encounter: Payer: Self-pay | Admitting: Family Medicine

## 2018-01-17 DIAGNOSIS — L219 Seborrheic dermatitis, unspecified: Secondary | ICD-10-CM | POA: Insufficient documentation

## 2018-01-17 NOTE — Telephone Encounter (Signed)
noted 

## 2018-01-20 MED ORDER — OMEPRAZOLE 40 MG PO CPDR: 40.0000 mg | DELAYED_RELEASE_CAPSULE | Freq: Every day | ORAL | 1 refills | Status: DC

## 2018-02-01 ENCOUNTER — Other Ambulatory Visit: Payer: BLUE CROSS/BLUE SHIELD

## 2018-02-15 DIAGNOSIS — R195 Other fecal abnormalities: Secondary | ICD-10-CM | POA: Insufficient documentation

## 2018-02-17 ENCOUNTER — Telehealth: Payer: Self-pay | Admitting: Family Medicine

## 2018-02-17 DIAGNOSIS — R195 Other fecal abnormalities: Secondary | ICD-10-CM

## 2018-02-17 NOTE — Telephone Encounter (Signed)
RECEIVED POSITIVE COLOGUARD RESULTS from [facility] - not in chart/resulted labs yet. Called pt - she agrees to sched colonoscopy now but will have to schedule for after 7/1 - 5 weeks away due to $$ - reassured pt that the timing will be fine. Had very big results to miralax cleanout with numerous large stools inc sev episodes of fecal incontinence diarrhea/urgency.  Now linzess is highly effective - stooling tons.   Disappointed that no change in waistline with cleanout though scale says lost 5 lbs.

## 2018-02-25 LAB — COLOGUARD: Cologuard: POSITIVE

## 2018-03-06 ENCOUNTER — Encounter: Payer: Self-pay | Admitting: *Deleted

## 2018-03-14 ENCOUNTER — Other Ambulatory Visit: Payer: BLUE CROSS/BLUE SHIELD

## 2018-03-22 ENCOUNTER — Ambulatory Visit: Payer: BLUE CROSS/BLUE SHIELD | Admitting: Family Medicine

## 2018-03-22 ENCOUNTER — Other Ambulatory Visit: Payer: Self-pay | Admitting: Family Medicine

## 2018-03-24 NOTE — Telephone Encounter (Signed)
Metformin refill Last OV:01/05/18 Last refill:10/21/17 3 refills by historical provider UYW:XIPP Pharmacy: [id number] [address] [place] [phone](Phone) [phone](Fax)

## 2018-03-30 ENCOUNTER — Other Ambulatory Visit: Payer: Self-pay

## 2018-04-10 ENCOUNTER — Ambulatory Visit: Payer: BLUE CROSS/BLUE SHIELD | Admitting: Family Medicine

## 2018-04-17 ENCOUNTER — Other Ambulatory Visit: Payer: Self-pay | Admitting: Family Medicine

## 2018-04-17 DIAGNOSIS — R195 Other fecal abnormalities: Secondary | ICD-10-CM

## 2018-04-19 ENCOUNTER — Other Ambulatory Visit: Payer: Self-pay | Admitting: Family Medicine

## 2018-04-19 NOTE — Telephone Encounter (Signed)
Linzess 145 mcg refill request  LOV 01/05/18 Dr. Brigitte Pulse       Last refill:  01/05/18  #30   Refills 3  [id number] [address], [place]

## 2018-04-20 NOTE — Telephone Encounter (Signed)
Dr Brigitte Pulse please advise.

## 2018-04-26 ENCOUNTER — Ambulatory Visit: Payer: BLUE CROSS/BLUE SHIELD | Admitting: Family Medicine

## 2018-04-30 NOTE — Telephone Encounter (Signed)
No further refills w/o OV - pt had a + cologuard - was referred to GI but GI referral note states that she preferred to wait until the end of August to sched her GI appt/colonoscopy - still has not been scheduled

## 2018-05-13 ENCOUNTER — Other Ambulatory Visit: Payer: Self-pay | Admitting: Family Medicine

## 2018-05-27 ENCOUNTER — Ambulatory Visit: Payer: BLUE CROSS/BLUE SHIELD | Admitting: Family Medicine

## 2018-06-07 ENCOUNTER — Other Ambulatory Visit: Payer: Self-pay | Admitting: Family Medicine

## 2018-06-14 ENCOUNTER — Other Ambulatory Visit: Payer: Self-pay | Admitting: Family Medicine

## 2018-06-17 ENCOUNTER — Other Ambulatory Visit: Payer: Self-pay | Admitting: Family Medicine

## 2018-06-28 ENCOUNTER — Other Ambulatory Visit: Payer: Self-pay

## 2018-06-28 ENCOUNTER — Ambulatory Visit: Payer: BLUE CROSS/BLUE SHIELD | Admitting: Family Medicine

## 2018-06-28 ENCOUNTER — Encounter: Payer: Self-pay | Admitting: Emergency Medicine

## 2018-06-28 ENCOUNTER — Ambulatory Visit: Payer: BLUE CROSS/BLUE SHIELD | Admitting: Emergency Medicine

## 2018-06-28 VITALS — BP 102/67 | HR 108 | Temp 98.7°F | Resp 16 | Ht 64.0 in | Wt 183.0 lb

## 2018-06-28 DIAGNOSIS — K5909 Other constipation: Secondary | ICD-10-CM | POA: Diagnosis not present

## 2018-06-28 DIAGNOSIS — Z794 Long term (current) use of insulin: Secondary | ICD-10-CM | POA: Diagnosis not present

## 2018-06-28 DIAGNOSIS — E785 Hyperlipidemia, unspecified: Secondary | ICD-10-CM | POA: Diagnosis not present

## 2018-06-28 DIAGNOSIS — I10 Essential (primary) hypertension: Secondary | ICD-10-CM

## 2018-06-28 DIAGNOSIS — E1165 Type 2 diabetes mellitus with hyperglycemia: Secondary | ICD-10-CM | POA: Diagnosis not present

## 2018-06-28 LAB — POCT GLYCOSYLATED HEMOGLOBIN (HGB A1C): HEMOGLOBIN A1C: 8.1 % — AB (ref 4.0–5.6)

## 2018-06-28 LAB — GLUCOSE, POCT (MANUAL RESULT ENTRY): POC GLUCOSE: 205 mg/dL — AB (ref 70–99)

## 2018-06-28 MED ORDER — INSULIN GLARGINE 300 UNIT/ML ~~LOC~~ SOPN: 75.0000 [IU] | PEN_INJECTOR | Freq: Every morning | SUBCUTANEOUS | 3 refills | Status: DC

## 2018-06-28 MED ORDER — LINACLOTIDE 145 MCG PO CAPS: 145.0000 ug | ORAL_CAPSULE | Freq: Every day | ORAL | 0 refills | Status: DC

## 2018-06-28 NOTE — Progress Notes (Signed)
Lab Results  Component Value Date   HGBA1C 9.2 01/05/2018   Lab Results  Component Value Date   CHOL 191 01/05/2018   HDL 48 01/05/2018   LDLCALC 113 (H) 01/05/2018   TRIG 152 (H) 01/05/2018   CHOLHDL 4.0 01/05/2018   Jeanette Rose 62 y.o.   Chief Complaint  Patient presents with  . Medication Refill    TOUJEO and LINZESS  . Diabetes    follow up    HISTORY OF PRESENT ILLNESS: This is a 62 y.o. female with history of diabetes, hypertension, dyslipidemia, chronic depression, here for follow-up and medication refills. States blood sugar at home has been in the 150s.  Highest 300. Has no complaints or medical concerns today.  HPI   Prior to Admission medications   Medication Sig Start Date End Date Taking? Authorizing Provider  AMANTADINE HCL PO Take by mouth 2 (two) times daily.   Yes [provider]  aspirin (ASPIRIN LOW DOSE) 81 MG tablet    Yes [provider]  atorvastatin (LIPITOR) 40 MG tablet TAKE 1 TABLET BY MOUTH EVERY DAY 04/21/18  Yes Shawnee Knapp, MD  cetirizine (ZYRTEC) 10 MG tablet Take 10 mg by mouth daily.   Yes [provider]  Cyanocobalamin (VITAMIN B-12 CR PO) Take 1 tablet by mouth daily.   Yes [provider]  Dulaglutide (TRULICITY) 1.5 RC/7.8LF SOPN Inject 1.5 mg into the skin once a week. 01/05/18  Yes Shawnee Knapp, MD  Lancets (FREESTYLE) lancets 1 Units by Does not apply route 4 (four) times daily. 07/26/14  Yes [provider]  linaclotide (LINZESS) 145 MCG CAPS capsule Take 1 capsule (145 mcg total) by mouth daily before breakfast. **NEED GI VISIT FOR ANY ADDITIONAL REFILLS** 04/30/18  Yes Shawnee Knapp, MD  LORazepam (ATIVAN) 1 MG tablet Take 1 mg by mouth every 8 (eight) hours as needed for anxiety.    Yes [provider]  metFORMIN (GLUCOPHAGE-XR) 750 MG 24 hr tablet Take 1 tablet (750 mg total) by mouth daily with supper. Patient needs visit for more refills 06/14/18  Yes Shawnee Knapp, MD  olmesartan  (BENICAR) 20 MG tablet TAKE 1 TABLET BY MOUTH EVERY DAY 06/07/18  Yes Shawnee Knapp, MD  omeprazole (PRILOSEC) 40 MG capsule Take 1 capsule (40 mg total) by mouth daily. 30 minutes before a meal. 01/20/18  Yes Shawnee Knapp, MD  ondansetron (ZOFRAN) 4 MG tablet Take 4 mg by mouth every 8 (eight) hours as needed for nausea or vomiting.   Yes [provider]  polyethylene glycol powder (GLYCOLAX/MIRALAX) powder Take 15-30 g by mouth daily. 11/03/17  Yes Shawnee Knapp, MD  TOUJEO MAX SOLOSTAR 300 UNIT/ML SOPN INJECT 55 UNITS INTO THE SKIN EVERY MORNING. 06/19/18  Yes Shawnee Knapp, MD  VITAMIN D, CHOLECALCIFEROL, PO Take 2,000 Units by mouth daily.    Yes [provider]  Vitamin D, Ergocalciferol, (DRISDOL) 50000 units CAPS capsule TAKE 1 CAPSULE (50,000 UNITS TOTAL) BY MOUTH EVERY 7 (SEVEN) DAYS. 01/12/18  Yes Shawnee Knapp, MD  gabapentin (NEURONTIN) 100 MG capsule Take 100 mg by mouth 4 (four) times daily. 10/12/17   [provider]  TRINTELLIX 20 MG TABS tablet Take 20 mg by mouth daily. 12/24/17   Chucky May, MD    No Known Allergies  Patient Active Problem List   Diagnosis Date Noted  . Positive colorectal cancer screening using Cologuard test 02/15/2018  . Seborrheic dermatitis   . History  of hypothyroidism 10/26/2017  . Elevated ferritin level 10/26/2017  . Vitamin D deficiency 10/26/2017  . Diabetic peripheral neuropathy associated with type 2 diabetes mellitus ([place]) 10/26/2017  . OSA (obstructive sleep apnea)   . Hyperlipidemia LDL goal <100   . Major depressive disorder, recurrent episode ([place])   . Constipation 04/22/2014  . Steatohepatitis 04/03/2014  . Anxiety   . Type 2 diabetes mellitus ([place])   . GERD (gastroesophageal reflux disease)   . Hypertension   . Obesity (BMI 30-39.9)     Past Medical History:  Diagnosis Date  . Acute acalculous cholecystitis s/p lap chole 04/03/2014 04/02/2014  . Allergy   . Anemia   . Anxiety   . Asthma   . Depression   .  Fatty liver    noted on CT per [facility] records  . Fibromyalgia   . GERD (gastroesophageal reflux disease)   . Hyperlipidemia associated with type 2 diabetes mellitus ([place])   . Hypertension   . Hypothyroid 2015   Transiently in 2015. Synthroid 25mg was stopped 05/07/2016 w/ tsh nml following.  . Obesity (BMI 30-39.9)   . OSA (obstructive sleep apnea)    non-compliant with CPAP  . Seborrheic dermatitis    [facility] face and scalp[facility]  . St[facility]    TIA  . [place] 2 diabetes mellitus [place]     Past Surgical History:  Procedure Laterality Date  . BREAST REDUCTION SURGERY  06/2008  . CHOLECYSTECTOMY N/A 04/03/2014   Procedure: LAPAROSCOPIC CHOLECYSTECTOMY ;  Surgeon: SAdin Hector MD;  Location: [facility];  Service: General;  Laterality: N/A;  . REDUCTION MAMMAPLASTY    . TUBAL LIGATION      Social History   Socioeconomic History  . Marital status: Divorced    Spouse name: Not on file  . Number of children: Not on file  . Years of education: Not on file  . Highest education level: Not on file  Occupational History  . Not on file  Social Needs  . Financial resource strain: Not on file  . Food insecurity:    Worry: Not on file    Inability: Not on file  . Transportation needs:    Medical: Not on file    Non-medical: Not on file  Tobacco Use  . Smoking status: Former Smoker    Types: Cigarettes    Last attempt to quit: 04/22/2004    Years since quitting: 14.1  . Smokeless tobacco: Never Used  . Tobacco comment: 4 pack year hx  Substance and Sexual Activity  . Alcohol use: No  . Drug use: No  . Sexual activity: Never  Lifestyle  . Physical activity:    Days per week: Not on file    Minutes per session: Not on file  . Stress: Not on file  Relationships  . Social connections:    Talks on phone: Not on file    Gets together: Not on file    Attends religious service: Not on file    Active member of club or organization: Not on file    Attends  meetings of clubs or organizations: Not on file    Relationship status: Not on file  . Intimate partner violence:    Fear of current or ex partner: Not on file    Emotionally abused: Not on file    Physically abused: Not on file    Forced sexual activity: Not on file  Other Topics Concern  . Not on file  Social History Narrative  . Not on file    Family History  Problem Relation Age of Onset  . Atrial fibrillation Mother   . Heart disease Father        CAD  . Diabetes Brother   . Cancer Maternal Grandmother   . Diabetes Paternal Grandmother   . Fibromyalgia Sister   . Colitis Sister      Review of Systems  Constitutional: Negative.  Negative for chills and fever.  HENT: Negative.  Negative for sore throat.   Eyes: Negative.  Negative for blurred vision and double vision.  Respiratory: Negative.  Negative for shortness of breath.   Cardiovascular: Negative.  Negative for chest pain.  Gastrointestinal: Positive for constipation (chronic). Negative for abdominal pain, diarrhea, nausea and vomiting.  Genitourinary: Negative.  Negative for dysuria and hematuria.  Musculoskeletal: Negative.  Negative for neck pain.  Skin: Negative.  Negative for rash.  Neurological: Negative.  Negative for dizziness and headaches.  Endo/Heme/Allergies: Negative.   All other systems reviewed and are negative.   Vitals:   06/28/18 0956  BP: 102/67  Pulse: (!) 108  Resp: 16  Temp: 98.7 F (37.1 C)  SpO2: 94%    Physical Exam  Constitutional: She is oriented to person, place, and time. She appears well-developed and well-nourished.  HENT:  Head: Normocephalic and atraumatic.  Nose: Nose normal.  Mouth/Throat: Oropharynx is clear and moist.  Eyes: Pupils are equal, round, and reactive to light. Conjunctivae and EOM are normal.  Neck: Normal range of motion. Neck supple. No thyromegaly present.  Cardiovascular: Normal rate, regular rhythm and normal heart sounds.  Pulmonary/Chest:  Effort normal and breath sounds normal.  Abdominal: Soft. She exhibits no distension. There is no tenderness.  Musculoskeletal: Normal range of motion.  Lymphadenopathy:    She has no cervical adenopathy.  Neurological: She is alert and oriented to person, place, and time. No sensory deficit. She exhibits normal muscle tone.  Skin: Skin is warm and dry. Capillary refill takes less than 2 seconds.  Psychiatric: She has a normal mood and affect. Her behavior is normal.  Vitals reviewed.  Results for orders placed or performed in visit on 06/28/18 (from the past 24 hour(s))  POCT glucose (manual entry)     Status: Abnormal   Collection Time: 06/28/18 10:32 AM  Result Value Ref Range   POC Glucose 205 (A) 70 - 99 mg/dl  POCT glycosylated hemoglobin (Hb A1C)     Status: Abnormal   Collection Time: 06/28/18 10:37 AM  Result Value Ref Range   Hemoglobin A1C 8.1 (A) 4.0 - 5.6 %   HbA1c POC (<> result, manual entry)     HbA1c, POC (prediabetic range)     HbA1c, POC (controlled diabetic range)      Type 2 diabetes mellitus (HCC) Hemoglobin A1c less than before at 8.1.  We will continue present treatment and follow-up in 3 months.  A total of 40 minutes was spent in the room with the patient, greater than 50% of which was in counseling/coordination of care regarding diabetes and chronic medical problems, management, medications, blood results, nutrition, diabetic diet, and need to follow-up with Dr. Brigitte Pulse and GI doctor as planned before.  ASSESSMENT & PLAN: Annaleigh was seen today for medication refill and diabetes.  Diagnoses and all orders for this visit:  Type 2 diabetes mellitus with hyperglycemia, with long-term current use of insulin (HCC) -     POCT glycosylated hemoglobin (Hb A1C) -  POCT glucose (manual entry) -     Comprehensive metabolic panel -     Insulin Glargine (TOUJEO MAX SOLOSTAR) 300 UNIT/ML SOPN; Inject 75 Units into the skin every morning.  Essential hypertension -      Comprehensive metabolic panel  Dyslipidemia -     Lipid panel -     Comprehensive metabolic panel  Chronic constipation  Other orders -     linaclotide (LINZESS) 145 MCG CAPS capsule; Take 1 capsule (145 mcg total) by mouth daily before breakfast. **NEED GI VISIT FOR ANY ADDITIONAL REFILLS**    Patient Instructions       If you have lab work done today you will be contacted with your lab results within the next 2 weeks.  If you have not heard from [place] then please contact us. The fastest way to get your results is to register for My Chart.   IF you received an x-ray today, you will receive an invoice from [facility]. Please contact [facility] at [phone] with questions or concerns regarding your invoice.   IF you received labwork today, you will receive an invoice from [facility]. Please contact [facility] at [phone] with questions or concerns regarding your invoice.   Our billing staff will not be able to assist you with questions regarding bills from these companies.  You will be contacted with the lab results as soon as they are available. The fastest way to get your results is to activate your My Chart account. Instructions are located on the last page of this paperwork. If you have not heard from [place] regarding the results in 2 weeks, please contact this office.     Diabetes Mellitus and Nutrition When you have diabetes (diabetes mellitus), it is very important to have healthy eating habits because your blood sugar (glucose) levels are greatly affected by what you eat and drink. Eating healthy foods in the appropriate amounts, at about the same times every day, can help you:  Control your blood glucose.  Lower your risk of heart disease.  Improve your blood pressure.  Reach or maintain a healthy weight.  Every person with diabetes is different, and each person has different needs for a meal plan. Your health care provider may recommend that you  work with a diet and nutrition specialist (dietitian) to make a meal plan that is best for you. Your meal plan may vary depending on factors such as:  The calories you need.  The medicines you take.  Your weight.  Your blood glucose, blood pressure, and cholesterol levels.  Your activity level.  Other health conditions you have, such as heart or kidney disease.  How do carbohydrates affect me? Carbohydrates affect your blood glucose level more than any other type of food. Eating carbohydrates naturally increases the amount of glucose in your blood. Carbohydrate counting is a method for keeping track of how many carbohydrates you eat. Counting carbohydrates is important to keep your blood glucose at a healthy level, especially if you use insulin or take certain oral diabetes medicines. It is important to know how many carbohydrates you can safely have in each meal. This is different for every person. Your dietitian can help you calculate how many carbohydrates you should have at each meal and for snack. Foods that contain carbohydrates include:  Bread, cereal, rice, pasta, and crackers.  Potatoes and corn.  Peas, beans, and lentils.  Milk and yogurt.  Fruit and juice.  Desserts, such as cakes, cookies, ice cream, and candy.  How does alcohol affect me? Alcohol can cause a sudden decrease in blood glucose (hypoglycemia), especially if you use insulin or take certain oral diabetes medicines. Hypoglycemia can be a life-threatening condition. Symptoms of hypoglycemia (sleepiness, dizziness, and confusion) are similar to symptoms of having too much alcohol. If your health care provider says that alcohol is safe for you, follow these guidelines:  Limit alcohol intake to no more than 1 drink per day for nonpregnant women and 2 drinks per day for men. One drink equals 12 oz of beer, 5 oz of wine, or 1 oz of hard liquor.  Do not drink on an empty stomach.  Keep yourself hydrated with  water, diet soda, or unsweetened iced tea.  Keep in mind that regular soda, juice, and other mixers may contain a lot of sugar and must be counted as carbohydrates.  What are tips for following this plan? Reading food labels  Start by checking the serving size on the label. The amount of calories, carbohydrates, fats, and other nutrients listed on the label are based on one serving of the food. Many foods contain more than one serving per package.  Check the total grams (g) of carbohydrates in one serving. You can calculate the number of servings of carbohydrates in one serving by dividing the total carbohydrates by 15. For example, if a food has 30 g of total carbohydrates, it would be equal to 2 servings of carbohydrates.  Check the number of grams (g) of saturated and trans fats in one serving. Choose foods that have low or no amount of these fats.  Check the number of milligrams (mg) of sodium in one serving. Most people should limit total sodium intake to less than 2,300 mg per day.  Always check the nutrition information of foods labeled as "low-fat" or "nonfat". These foods may be higher in added sugar or refined carbohydrates and should be avoided.  Talk to your dietitian to identify your daily goals for nutrients listed on the label. Shopping  Avoid buying canned, premade, or processed foods. These foods tend to be high in fat, sodium, and added sugar.  Shop around the outside edge of the grocery store. This includes fresh fruits and vegetables, bulk grains, fresh meats, and fresh dairy. Cooking  Use low-heat cooking methods, such as baking, instead of high-heat cooking methods like deep frying.  Cook using healthy oils, such as olive, canola, or sunflower oil.  Avoid cooking with butter, cream, or high-fat meats. Meal planning  Eat meals and snacks regularly, preferably at the same times every day. Avoid going long periods of time without eating.  Eat foods high in fiber,  such as fresh fruits, vegetables, beans, and whole grains. Talk to your dietitian about how many servings of carbohydrates you can eat at each meal.  Eat 4-6 ounces of lean protein each day, such as lean meat, chicken, fish, eggs, or tofu. 1 ounce is equal to 1 ounce of meat, chicken, or fish, 1 egg, or 1/4 cup of tofu.  Eat some foods each day that contain healthy fats, such as avocado, nuts, seeds, and fish. Lifestyle   Check your blood glucose regularly.  Exercise at least 30 minutes 5 or more days each week, or as told by your health care provider.  Take medicines as told by your health care provider.  Do not use any products that contain nicotine or tobacco, such as cigarettes and e-cigarettes. If you need help quitting, ask your health care provider.  Work  with a counselor or diabetes educator to identify strategies to manage stress and any emotional and social challenges. What are some questions to ask my health care provider?  Do I need to meet with a diabetes educator?  Do I need to meet with a dietitian?  What number can I call if I have questions?  When are the best times to check my blood glucose? Where to find more information:  American Diabetes Association: diabetes.org/food-and-fitness/food  Academy of Nutrition and Dietetics: PokerClues.dk  [employer] of Diabetes and Digestive and Kidney Diseases (NIH): ContactWire.be Summary  A healthy meal plan will help you control your blood glucose and maintain a healthy lifestyle.  Working with a diet and nutrition specialist (dietitian) can help you make a meal plan that is best for you.  Keep in mind that carbohydrates and alcohol have immediate effects on your blood glucose levels. It is important to count carbohydrates and to use alcohol carefully. This information is not intended to  replace advice given to you by your health care provider. Make sure you discuss any questions you have with your health care provider. Document Released: 06/10/2005 Document Revised: 10/18/2016 Document Reviewed: 10/18/2016 Elsevier Interactive Patient Education  2018 [employer].      Agustina Caroli, MD [facility]

## 2018-06-28 NOTE — Patient Instructions (Addendum)
If you have lab work done today you will be contacted with your lab results within the next 2 weeks.  If you have not heard from [place] then please contact us. The fastest way to get your results is to register for My Chart.   IF you received an x-ray today, you will receive an invoice from [facility]. Please contact [facility] at [phone] with questions or concerns regarding your invoice.   IF you received labwork today, you will receive an invoice from [place]. Please contact [facility] at [phone] with questions or concerns regarding your invoice.   Our billing staff will not be able to assist you with questions regarding bills from these companies.  You will be contacted with the lab results as soon as they are available. The fastest way to get your results is to activate your My Chart account. Instructions are located on the last page of this paperwork. If you have not heard from [place] regarding the results in 2 weeks, please contact this office.     Diabetes Mellitus and Nutrition When you have diabetes (diabetes mellitus), it is very important to have healthy eating habits because your blood sugar (glucose) levels are greatly affected by what you eat and drink. Eating healthy foods in the appropriate amounts, at about the same times every day, can help you:  Control your blood glucose.  Lower your risk of heart disease.  Improve your blood pressure.  Reach or maintain a healthy weight.  Every person with diabetes is different, and each person has different needs for a meal plan. Your health care provider may recommend that you work with a diet and nutrition specialist (dietitian) to make a meal plan that is best for you. Your meal plan may vary depending on factors such as:  The calories you need.  The medicines you take.  Your weight.  Your blood glucose, blood pressure, and cholesterol levels.  Your activity level.  Other health conditions you  have, such as heart or kidney disease.  How do carbohydrates affect me? Carbohydrates affect your blood glucose level more than any other type of food. Eating carbohydrates naturally increases the amount of glucose in your blood. Carbohydrate counting is a method for keeping track of how many carbohydrates you eat. Counting carbohydrates is important to keep your blood glucose at a healthy level, especially if you use insulin or take certain oral diabetes medicines. It is important to know how many carbohydrates you can safely have in each meal. This is different for every person. Your dietitian can help you calculate how many carbohydrates you should have at each meal and for snack. Foods that contain carbohydrates include:  Bread, cereal, rice, pasta, and crackers.  Potatoes and corn.  Peas, beans, and lentils.  Milk and yogurt.  Fruit and juice.  Desserts, such as cakes, cookies, ice cream, and candy.  How does alcohol affect me? Alcohol can cause a sudden decrease in blood glucose (hypoglycemia), especially if you use insulin or take certain oral diabetes medicines. Hypoglycemia can be a life-threatening condition. Symptoms of hypoglycemia (sleepiness, dizziness, and confusion) are similar to symptoms of having too much alcohol. If your health care provider says that alcohol is safe for you, follow these guidelines:  Limit alcohol intake to no more than 1 drink per day for nonpregnant women and 2 drinks per day for men. One drink equals 12 oz of beer, 5 oz of wine, or 1 oz of hard liquor.  Do  not drink on an empty stomach.  Keep yourself hydrated with water, diet soda, or unsweetened iced tea.  Keep in mind that regular soda, juice, and other mixers may contain a lot of sugar and must be counted as carbohydrates.  What are tips for following this plan? Reading food labels  Start by checking the serving size on the label. The amount of calories, carbohydrates, fats, and other  nutrients listed on the label are based on one serving of the food. Many foods contain more than one serving per package.  Check the total grams (g) of carbohydrates in one serving. You can calculate the number of servings of carbohydrates in one serving by dividing the total carbohydrates by 15. For example, if a food has 30 g of total carbohydrates, it would be equal to 2 servings of carbohydrates.  Check the number of grams (g) of saturated and trans fats in one serving. Choose foods that have low or no amount of these fats.  Check the number of milligrams (mg) of sodium in one serving. Most people should limit total sodium intake to less than 2,300 mg per day.  Always check the nutrition information of foods labeled as "low-fat" or "nonfat". These foods may be higher in added sugar or refined carbohydrates and should be avoided.  Talk to your dietitian to identify your daily goals for nutrients listed on the label. Shopping  Avoid buying canned, premade, or processed foods. These foods tend to be high in fat, sodium, and added sugar.  Shop around the outside edge of the grocery store. This includes fresh fruits and vegetables, bulk grains, fresh meats, and fresh dairy. Cooking  Use low-heat cooking methods, such as baking, instead of high-heat cooking methods like deep frying.  Cook using healthy oils, such as olive, canola, or sunflower oil.  Avoid cooking with butter, cream, or high-fat meats. Meal planning  Eat meals and snacks regularly, preferably at the same times every day. Avoid going long periods of time without eating.  Eat foods high in fiber, such as fresh fruits, vegetables, beans, and whole grains. Talk to your dietitian about how many servings of carbohydrates you can eat at each meal.  Eat 4-6 ounces of lean protein each day, such as lean meat, chicken, fish, eggs, or tofu. 1 ounce is equal to 1 ounce of meat, chicken, or fish, 1 egg, or 1/4 cup of tofu.  Eat some  foods each day that contain healthy fats, such as avocado, nuts, seeds, and fish. Lifestyle   Check your blood glucose regularly.  Exercise at least 30 minutes 5 or more days each week, or as told by your health care provider.  Take medicines as told by your health care provider.  Do not use any products that contain nicotine or tobacco, such as cigarettes and e-cigarettes. If you need help quitting, ask your health care provider.  Work with a Social worker or diabetes educator to identify strategies to manage stress and any emotional and social challenges. What are some questions to ask my health care provider?  Do I need to meet with a diabetes educator?  Do I need to meet with a dietitian?  What number can I call if I have questions?  When are the best times to check my blood glucose? Where to find more information:  American Diabetes Association: diabetes.org/food-and-fitness/food  Academy of Nutrition and Dietetics: PokerClues.dk  [employer] of Diabetes and Digestive and Kidney Diseases (NIH): ContactWire.be Summary  A healthy meal plan will  help you control your blood glucose and maintain a healthy lifestyle.  Working with a diet and nutrition specialist (dietitian) can help you make a meal plan that is best for you.  Keep in mind that carbohydrates and alcohol have immediate effects on your blood glucose levels. It is important to count carbohydrates and to use alcohol carefully. This information is not intended to replace advice given to you by your health care provider. Make sure you discuss any questions you have with your health care provider. Document Released: 06/10/2005 Document Revised: 10/18/2016 Document Reviewed: 10/18/2016 Elsevier Interactive Patient Education  [employer].

## 2018-06-28 NOTE — Assessment & Plan Note (Signed)
Hemoglobin A1c less than before at 8.1.  We will continue present treatment and follow-up in 3 months.

## 2018-06-29 ENCOUNTER — Other Ambulatory Visit: Payer: Self-pay | Admitting: Family Medicine

## 2018-06-29 LAB — COMPREHENSIVE METABOLIC PANEL
A/G RATIO: 1.5 (ref 1.2–2.2)
ALT: 38 IU/L — AB (ref 0–32)
AST: 32 IU/L (ref 0–40)
Albumin: 4.1 g/dL (ref 3.6–4.8)
Alkaline Phosphatase: 106 IU/L (ref 39–117)
BILIRUBIN TOTAL: 0.4 mg/dL (ref 0.0–1.2)
BUN/Creatinine Ratio: 17 (ref 12–28)
BUN: 15 mg/dL (ref 8–27)
CO2: 24 mmol/L (ref 20–29)
Calcium: 10 mg/dL (ref 8.7–10.3)
Chloride: 97 mmol/L (ref 96–106)
Creatinine, Ser: 0.88 mg/dL (ref 0.57–1.00)
GFR calc non Af Amer: 71 mL/min/{1.73_m2} (ref >59)
GFR, EST AFRICAN AMERICAN: 82 mL/min/{1.73_m2} (ref >59)
Globulin, Total: 2.8 g/dL (ref 1.5–4.5)
Glucose: 225 mg/dL — ABNORMAL HIGH (ref 65–99)
POTASSIUM: 4.6 mmol/L (ref 3.5–5.2)
SODIUM: 138 mmol/L (ref 134–144)
Total Protein: 6.9 g/dL (ref 6.0–8.5)

## 2018-06-29 LAB — LIPID PANEL
CHOL/HDL RATIO: 5 ratio — AB (ref 0.0–4.4)
Cholesterol, Total: 185 mg/dL (ref 100–199)
HDL: 37 mg/dL — ABNORMAL LOW (ref >39)
LDL CALC: 77 mg/dL (ref 0–99)
TRIGLYCERIDES: 353 mg/dL — AB (ref 0–149)
VLDL Cholesterol Cal: 71 mg/dL — ABNORMAL HIGH (ref 5–40)

## 2018-06-29 NOTE — Telephone Encounter (Signed)
Saw Dr. Franky Macho yesterday 06/28/18  Future appt in 3 months

## 2018-07-05 ENCOUNTER — Other Ambulatory Visit: Payer: Self-pay | Admitting: Family Medicine

## 2018-07-05 NOTE — Telephone Encounter (Signed)
Requested medication (s) are due for refill today -yes  Requested medication (s) are on the active medication list -yes  Future visit scheduled -yes  Notes to clinic: Patient had recent appointment- Vitamin D not checked- failed protocol lad requirement for provider review. Atorvastatin not addressed with elevated labs- for provider review.  Requested Prescriptions  Pending Prescriptions Disp Refills   Vitamin D, Ergocalciferol, (DRISDOL) 50000 units CAPS capsule [Pharmacy Med Name: VITAMIN D2 1.25MG(50,000 UNIT)] 12 capsule 1    Sig: TAKE 1 CAPSULE (50,000 UNITS TOTAL) BY MOUTH EVERY 7 (SEVEN) DAYS.     Endocrinology:  Vitamins - Vitamin D Supplementation Failed - 07/05/2018  4:01 PM      Failed - 50,000 IU strengths are not delegated      Failed - Phosphate in normal range and within 360 days    No results found for: PHOS       Failed - Vit D in normal range and within 360 days    Vit D, 25-Hydroxy  Date Value Ref Range Status  10/13/2017 10.6 (L) 30.0 - 100.0 ng/mL Final    Comment:    Vitamin D deficiency has been defined by the Institute of Medicine and an Endocrine Society practice guideline as a level of serum 25-OH vitamin D less than 20 ng/mL (1,2). The Endocrine Society went on to further define vitamin D insufficiency as a level between 21 and 29 ng/mL (2). 1. IOM (Institute of Medicine). 2010. Dietary reference    intakes for calcium and D. [place]: The    [employer]. 2. Holick MF, Binkley [place], Bischoff-Ferrari HA, et al.    Evaluation, treatment, and prevention of vitamin D    deficiency: an Endocrine Society clinical practice    guideline. JCEM. 2011 Jul; 96(7):1911-30.          Passed - Ca in normal range and within 360 days    Calcium  Date Value Ref Range Status  06/28/2018 10.0 8.7 - 10.3 mg/dL Final         Passed - Valid encounter within last 12 months    Recent Outpatient Visits          1 week ago Type 2 diabetes mellitus with  hyperglycemia, with long-term current use of insulin [facility])   Primary Care at [facility], [place], [place]   6 months ago Type 2 diabetes mellitus with complication, unspecified whether long term insulin use [facility])   Primary Care at [facility], [place], [place]   7 months ago Elevated ferritin level   Primary Care at [facility], [place], [place]   8 months ago Elevated ferritin level   Primary Care at [facility], [place], [place]   8 months ago Diabetes mellitus without complication [facility])   Primary Care at [facility], [place], [place]      Future Appointments            In 3 months Shawnee Knapp, MD Primary Care at [place], [place]          atorvastatin (LIPITOR) 40 MG tablet [Pharmacy Med Name: ATORVASTATIN 40 MG TABLET] 90 tablet 0    Sig: TAKE 1 TABLET BY MOUTH EVERY DAY     Cardiovascular:  Antilipid - Statins Failed - 07/05/2018  4:01 PM      Failed - HDL in normal range and within 360 days    HDL  Date Value Ref Range Status  06/28/2018 37 (L) >39 mg/dL Final  Failed - Triglycerides in normal range and within 360 days    Triglycerides  Date Value Ref Range Status  06/28/2018 353 (H) 0 - 149 mg/dL Final         Passed - Total Cholesterol in normal range and within 360 days    Cholesterol, Total  Date Value Ref Range Status  06/28/2018 185 100 - 199 mg/dL Final         Passed - LDL in normal range and within 360 days    LDL Calculated  Date Value Ref Range Status  06/28/2018 77 0 - 99 mg/dL Final         Passed - Patient is not pregnant      Passed - Valid encounter within last 12 months    Recent Outpatient Visits          1 week ago Type 2 diabetes mellitus with hyperglycemia, with long-term current use of insulin [facility])   Primary Care at [facility], [place], [place]   6 months ago Type 2 diabetes mellitus with complication, unspecified whether long term insulin use [facility])   Primary Care at [facility], [place]y Arrow, [place]   7 months ago Elevated ferritin level   Primary Care at  [facility], [place]urey Arrow, [place]   8 months ago Elevated ferritin level   Primary Care at [facility], [place]urey Arrow, [place]   8 months ago Diabetes mellitus without complication [facility])   Primary Care at [facility], [place], [place]      Future Appointments            In 3 months Shawnee Knapp, MD Primary Care at [place], [facility]         Signed Prescriptions Disp Refills   omeprazole (PRILOSEC) 40 MG capsule 90 capsule 1    Sig: TAKE 1 CAPSULE (40 MG TOTAL) BY MOUTH DAILY. 30 MINUTES BEFORE A MEAL.     Gastroenterology: Proton Pump Inhibitors Passed - 07/05/2018  4:01 PM      Passed - Valid encounter within last 12 months    Recent Outpatient Visits          1 week ago Type 2 diabetes mellitus with hyperglycemia, with long-term current use of insulin [facility])   Primary Care at [facility], [place], [place]   6 months ago Type 2 diabetes mellitus with complication, unspecified whether long term insulin use [facility])   Primary Care at [facility], [place]y Arrow, [place]   7 months ago Elevated ferritin level   Primary Care at [facility], [place] Arrow, [place]   8 months ago Elevated ferritin level   Primary Care at [facility], [place], [place]   8 months ago Diabetes mellitus without complication [facility])   Primary Care at [facility], [place]y Arrow, [place]      Future Appointments            In 3 months Shawnee Knapp, MD Primary Care at [place], [facility]

## 2018-07-11 NOTE — Telephone Encounter (Signed)
Saw Dr. Kittie Plater 2 weeks ago - bad cholesterol better but fats in the blood and total cholesterol still to high so rec increasing atorvastatin from 40 to 80 - new dose sent to pharmacy.  She has not had her vitamin D level checked in 9 mos so rec d/c rx supp and changing to ~2000-4000u otc vit D daily and have rechecked at her next OV.

## 2018-07-14 ENCOUNTER — Other Ambulatory Visit: Payer: Self-pay | Admitting: Family Medicine

## 2018-07-14 NOTE — Telephone Encounter (Addendum)
Placed call to patient. Left VM to return call to office. Medication requested will be refilled.

## 2018-07-16 ENCOUNTER — Other Ambulatory Visit: Payer: Self-pay

## 2018-07-18 ENCOUNTER — Other Ambulatory Visit: Payer: BLUE CROSS/BLUE SHIELD

## 2018-07-24 ENCOUNTER — Other Ambulatory Visit: Payer: BLUE CROSS/BLUE SHIELD

## 2018-07-24 ENCOUNTER — Encounter: Payer: Self-pay | Admitting: Family Medicine

## 2018-07-30 ENCOUNTER — Other Ambulatory Visit: Payer: Self-pay | Admitting: Family Medicine

## 2018-07-31 ENCOUNTER — Other Ambulatory Visit: Payer: BLUE CROSS/BLUE SHIELD

## 2018-08-28 ENCOUNTER — Other Ambulatory Visit: Payer: Self-pay | Admitting: Family Medicine

## 2018-09-28 ENCOUNTER — Telehealth: Payer: Self-pay | Admitting: Family Medicine

## 2018-09-28 NOTE — Telephone Encounter (Signed)
Reason for CRM: Patient needs a new discount code for Insulin Glargine (TOUJEO MAX SOLOSTAR) 300 UNIT/ML SOPN.. [facility] [address] has it already for her to fill but its too expensive and she needs a code. Please advise.

## 2018-10-05 NOTE — Telephone Encounter (Signed)
She can come by the office to get one of the coupon cards.  Thank you.

## 2018-10-05 NOTE — Telephone Encounter (Signed)
pls advise

## 2018-10-09 ENCOUNTER — Other Ambulatory Visit: Payer: Self-pay

## 2018-10-09 ENCOUNTER — Ambulatory Visit: Payer: BLUE CROSS/BLUE SHIELD | Admitting: Family Medicine

## 2018-10-09 ENCOUNTER — Other Ambulatory Visit: Payer: Self-pay | Admitting: Family Medicine

## 2018-10-09 ENCOUNTER — Encounter: Payer: Self-pay | Admitting: Emergency Medicine

## 2018-10-09 ENCOUNTER — Ambulatory Visit (INDEPENDENT_AMBULATORY_CARE_PROVIDER_SITE_OTHER): Payer: BLUE CROSS/BLUE SHIELD | Admitting: Emergency Medicine

## 2018-10-09 VITALS — BP 127/84 | HR 107 | Temp 98.8°F | Resp 16 | Ht 64.25 in | Wt 187.2 lb

## 2018-10-09 DIAGNOSIS — E1165 Type 2 diabetes mellitus with hyperglycemia: Secondary | ICD-10-CM

## 2018-10-09 DIAGNOSIS — Z794 Long term (current) use of insulin: Secondary | ICD-10-CM | POA: Diagnosis not present

## 2018-10-09 LAB — POCT GLYCOSYLATED HEMOGLOBIN (HGB A1C): Hemoglobin A1C: 9.5 % — AB (ref 4.0–5.6)

## 2018-10-09 LAB — GLUCOSE, POCT (MANUAL RESULT ENTRY): POC Glucose: 230 mg/dl — AB (ref 70–99)

## 2018-10-09 MED ORDER — GLIPIZIDE 5 MG PO TABS: 5.0000 mg | ORAL_TABLET | Freq: Every day | ORAL | 3 refills | Status: DC

## 2018-10-09 NOTE — Patient Instructions (Addendum)
If you have lab work done today you will be contacted with your lab results within the next 2 weeks.  If you have not heard from [place] then please contact us. The fastest way to get your results is to register for My Chart.   IF you received an x-ray today, you will receive an invoice from [facility]. Please contact [facility] at [phone] with questions or concerns regarding your invoice.   IF you received labwork today, you will receive an invoice from [place]. Please contact [facility] at [phone] with questions or concerns regarding your invoice.   Our billing staff will not be able to assist you with questions regarding bills from these companies.  You will be contacted with the lab results as soon as they are available. The fastest way to get your results is to activate your My Chart account. Instructions are located on the last page of this paperwork. If you have not heard from [place] regarding the results in 2 weeks, please contact this office.     Diabetes Mellitus and Nutrition, Adult When you have diabetes (diabetes mellitus), it is very important to have healthy eating habits because your blood sugar (glucose) levels are greatly affected by what you eat and drink. Eating healthy foods in the appropriate amounts, at about the same times every day, can help you:  Control your blood glucose.  Lower your risk of heart disease.  Improve your blood pressure.  Reach or maintain a healthy weight. Every person with diabetes is different, and each person has different needs for a meal plan. Your health care provider may recommend that you work with a diet and nutrition specialist (dietitian) to make a meal plan that is best for you. Your meal plan may vary depending on factors such as:  The calories you need.  The medicines you take.  Your weight.  Your blood glucose, blood pressure, and cholesterol levels.  Your activity level.  Other health conditions  you have, such as heart or kidney disease. How do carbohydrates affect me? Carbohydrates, also called carbs, affect your blood glucose level more than any other type of food. Eating carbs naturally raises the amount of glucose in your blood. Carb counting is a method for keeping track of how many carbs you eat. Counting carbs is important to keep your blood glucose at a healthy level, especially if you use insulin or take certain oral diabetes medicines. It is important to know how many carbs you can safely have in each meal. This is different for every person. Your dietitian can help you calculate how many carbs you should have at each meal and for each snack. Foods that contain carbs include:  Bread, cereal, rice, pasta, and crackers.  Potatoes and corn.  Peas, beans, and lentils.  Milk and yogurt.  Fruit and juice.  Desserts, such as cakes, cookies, ice cream, and candy. How does alcohol affect me? Alcohol can cause a sudden decrease in blood glucose (hypoglycemia), especially if you use insulin or take certain oral diabetes medicines. Hypoglycemia can be a life-threatening condition. Symptoms of hypoglycemia (sleepiness, dizziness, and confusion) are similar to symptoms of having too much alcohol. If your health care provider says that alcohol is safe for you, follow these guidelines:  Limit alcohol intake to no more than 1 drink per day for nonpregnant women and 2 drinks per day for men. One drink equals 12 oz of beer, 5 oz of wine, or 1 oz of hard liquor.  Do not drink on an empty stomach.  Keep yourself hydrated with water, diet soda, or unsweetened iced tea.  Keep in mind that regular soda, juice, and other mixers may contain a lot of sugar and must be counted as carbs. What are tips for following this plan?  Reading food labels  Start by checking the serving size on the "Nutrition Facts" label of packaged foods and drinks. The amount of calories, carbs, fats, and other  nutrients listed on the label is based on one serving of the item. Many items contain more than one serving per package.  Check the total grams (g) of carbs in one serving. You can calculate the number of servings of carbs in one serving by dividing the total carbs by 15. For example, if a food has 30 g of total carbs, it would be equal to 2 servings of carbs.  Check the number of grams (g) of saturated and trans fats in one serving. Choose foods that have low or no amount of these fats.  Check the number of milligrams (mg) of salt (sodium) in one serving. Most people should limit total sodium intake to less than 2,300 mg per day.  Always check the nutrition information of foods labeled as "low-fat" or "nonfat". These foods may be higher in added sugar or refined carbs and should be avoided.  Talk to your dietitian to identify your daily goals for nutrients listed on the label. Shopping  Avoid buying canned, premade, or processed foods. These foods tend to be high in fat, sodium, and added sugar.  Shop around the outside edge of the grocery store. This includes fresh fruits and vegetables, bulk grains, fresh meats, and fresh dairy. Cooking  Use low-heat cooking methods, such as baking, instead of high-heat cooking methods like deep frying.  Cook using healthy oils, such as olive, canola, or sunflower oil.  Avoid cooking with butter, cream, or high-fat meats. Meal planning  Eat meals and snacks regularly, preferably at the same times every day. Avoid going long periods of time without eating.  Eat foods high in fiber, such as fresh fruits, vegetables, beans, and whole grains. Talk to your dietitian about how many servings of carbs you can eat at each meal.  Eat 4-6 ounces (oz) of lean protein each day, such as lean meat, chicken, fish, eggs, or tofu. One oz of lean protein is equal to: ? 1 oz of meat, chicken, or fish. ? 1 egg. ?  cup of tofu.  Eat some foods each day that contain  healthy fats, such as avocado, nuts, seeds, and fish. Lifestyle  Check your blood glucose regularly.  Exercise regularly as told by your health care provider. This may include: ? 150 minutes of moderate-intensity or vigorous-intensity exercise each week. This could be brisk walking, biking, or water aerobics. ? Stretching and doing strength exercises, such as yoga or weightlifting, at least 2 times a week.  Take medicines as told by your health care provider.  Do not use any products that contain nicotine or tobacco, such as cigarettes and e-cigarettes. If you need help quitting, ask your health care provider.  Work with a Social worker or diabetes educator to identify strategies to manage stress and any emotional and social challenges. Questions to ask a health care provider  Do I need to meet with a diabetes educator?  Do I need to meet with a dietitian?  What number can I call if I have questions?  When are the best times to  check my blood glucose? Where to find more information:  American Diabetes Association: diabetes.org  Academy of Nutrition and Dietetics: www.eatright.[employer] of Diabetes and Digestive and Kidney Diseases (NIH): [employer]Funeral.dk Summary  A healthy meal plan will help you control your blood glucose and maintain a healthy lifestyle.  Working with a diet and nutrition specialist (dietitian) can help you make a meal plan that is best for you.  Keep in mind that carbohydrates (carbs) and alcohol have immediate effects on your blood glucose levels. It is important to count carbs and to use alcohol carefully. This information is not intended to replace advice given to you by your health care provider. Make sure you discuss any questions you have with your health care provider. Document Released: 06/10/2005 Document Revised: 04/13/2017 Document Reviewed: 10/18/2016 Elsevier Interactive Patient Education  2019 [employer].

## 2018-10-09 NOTE — Telephone Encounter (Signed)
Requested Prescriptions  Pending Prescriptions Disp Refills  . olmesartan (BENICAR) 20 MG tablet [Pharmacy Med Name: OLMESARTAN MEDOXOMIL 20 MG TAB] 90 tablet 0    Sig: TAKE 1 TABLET BY MOUTH EVERY DAY     Cardiovascular:  Angiotensin Receptor Blockers Passed - 10/09/2018  9:35 AM      Passed - Cr in normal range and within 180 days    Creat  Date Value Ref Range Status  04/26/2014 0.70 0.50 - 1.10 mg/dL Final   Creatinine, Ser  Date Value Ref Range Status  06/28/2018 0.88 0.57 - 1.00 mg/dL Final         Passed - K in normal range and within 180 days    Potassium  Date Value Ref Range Status  06/28/2018 4.6 3.5 - 5.2 mmol/L Final         Passed - Patient is not pregnant      Passed - Last BP in normal range    BP Readings from Last 1 Encounters:  10/09/18 127/84         Passed - Valid encounter within last 6 months    Recent Outpatient Visits          Today Type 2 diabetes mellitus with hyperglycemia, with long-term current use of insulin [facility])   Primary Care at [facility], [place], [place]   3 months ago Type 2 diabetes mellitus with hyperglycemia, with long-term current use of insulin [facility])   Primary Care at [facility], [place], [place]   9 months ago Type 2 diabetes mellitus with complication, unspecified whether long term insulin use [facility])   Primary Care at [facility], [place], [place]   10 months ago Elevated ferritin level   Primary Care at [facility], [place], [place]   11 months ago Elevated ferritin level   Primary Care at [facility], [place], [place]      Future Appointments            In 2 months Shawnee Knapp, MD Primary Care at [place], [facility]

## 2018-10-09 NOTE — Assessment & Plan Note (Signed)
Uncontrolled diabetes with elevated hemoglobin A1c at 9.5 today.  Will add glipizide 5 mg to present regimen.  Will refer to endocrinologist as well.  Needs to follow-up with Dr. Brigitte Pulse in 2 months.

## 2018-10-09 NOTE — Progress Notes (Signed)
Lab Results  Component Value Date   HGBA1C 8.1 (A) 06/28/2018   BP Readings from Last 3 Encounters:  10/09/18 127/84  06/28/18 102/67  01/05/18 116/66   Wt Readings from Last 3 Encounters:  10/09/18 187 lb 3.2 oz (84.9 kg)  06/28/18 183 lb (83 kg)  01/05/18 181 lb 9.6 oz (82.4 kg)   Lab Results  Component Value Date   CHOL 185 06/28/2018   HDL 37 (L) 06/28/2018   LDLCALC 77 06/28/2018   TRIG 353 (H) 06/28/2018   CHOLHDL 5.0 (H) 06/28/2018   [name] 63 y.o.   Chief Complaint  Patient presents with  . Diabetes    FOLLOW UP and  Diabetic Neuorpathy of feet    HISTORY OF PRESENT ILLNESS: This is a 63 y.o. female with history of diabetes, patient of Dr. Brigitte Pulse, here for follow-up.  States her glucose has been high in the range of 160-180.  Has been increasing her insulin glargine gradually.  Presently taking 96 units daily.  HPI   Prior to Admission medications   Medication Sig Start Date End Date Taking? Authorizing Provider  aspirin (ASPIRIN LOW DOSE) 81 MG tablet    Yes [provider]  atorvastatin (LIPITOR) 80 MG tablet Take 1 tablet (80 mg total) by mouth daily at 6 PM. 07/11/18  Yes Shawnee Knapp, MD  Brexpiprazole (REXULTI) 0.5 MG TABS Take by mouth daily.   Yes [provider]  cetirizine (ZYRTEC) 10 MG tablet Take 10 mg by mouth daily.   Yes [provider]  Cyanocobalamin (VITAMIN B-12 CR PO) Take 1 tablet by mouth daily.   Yes [provider]  Insulin Glargine (TOUJEO MAX SOLOSTAR) 300 UNIT/ML SOPN Inject 75 Units into the skin every morning. 06/28/18  Yes Shantanique Hodo, Ines Bloomer, MD  Lancets (FREESTYLE) lancets 1 Units by Does not apply route 4 (four) times daily. 07/26/14  Yes [provider]  linaclotide (LINZESS) 145 MCG CAPS capsule Take 1 capsule (145 mcg total) by mouth daily before breakfast. **NEED GI VISIT FOR ANY ADDITIONAL REFILLS** 06/28/18  Yes Evangela Heffler, Ines Bloomer, MD  LORazepam (ATIVAN) 1 MG tablet Take  1 mg by mouth every 8 (eight) hours as needed for anxiety.    Yes [provider]  metFORMIN (GLUCOPHAGE-XR) 750 MG 24 hr tablet Take 1 tablet (750 mg total) by mouth daily with supper. 07/14/18  Yes Shawnee Knapp, MD  olmesartan (BENICAR) 20 MG tablet TAKE 1 TABLET BY MOUTH EVERY DAY 06/29/18  Yes Shawnee Knapp, MD  omeprazole (PRILOSEC) 40 MG capsule TAKE 1 CAPSULE (40 MG TOTAL) BY MOUTH DAILY. 30 MINUTES BEFORE A MEAL. 07/05/18  Yes Shawnee Knapp, MD  ondansetron (ZOFRAN) 4 MG tablet Take 4 mg by mouth every 8 (eight) hours as needed for nausea or vomiting.   Yes [provider]  polyethylene glycol powder (GLYCOLAX/MIRALAX) powder Take 15-30 g by mouth daily. 11/03/17  Yes Shawnee Knapp, MD  TRULICITY 1.5 JE/5.6DJ SOPN INJECT 1.5 MG INTO THE SKIN ONCE A WEEK. 08/29/18  Yes Shawnee Knapp, MD  VITAMIN D, CHOLECALCIFEROL, PO Take 2,000 Units by mouth daily.    Yes [provider]  AMANTADINE HCL PO Take by mouth 2 (two) times daily.    [provider]  gabapentin (NEURONTIN) 100 MG capsule Take 100 mg by mouth 4 (four) times daily. 10/12/17   [provider]  TRINTELLIX 20 MG TABS tablet Take 20 mg by mouth daily. 12/24/17   Toy Care,  Rupinder, MD  Vitamin D, Ergocalciferol, (DRISDOL) 50000 units CAPS capsule TAKE 1 CAPSULE (50,000 UNITS TOTAL) BY MOUTH EVERY 7 (SEVEN) DAYS. Patient not taking: Reported on 10/09/2018 01/12/18   Shawnee Knapp, MD    No Known Allergies  Patient Active Problem List   Diagnosis Date Noted  . Positive colorectal cancer screening using Cologuard test 02/15/2018  . Seborrheic dermatitis   . History of hypothyroidism 10/26/2017  . Elevated ferritin level 10/26/2017  . Vitamin D deficiency 10/26/2017  . Diabetic peripheral neuropathy associated with type 2 diabetes mellitus ([place]) 10/26/2017  . OSA (obstructive sleep apnea)   . Hyperlipidemia LDL goal <100   . Major depressive disorder, recurrent episode ([place])   . Constipation 04/22/2014  .  Steatohepatitis 04/03/2014  . Anxiety   . Type 2 diabetes mellitus ([place])   . GERD (gastroesophageal reflux disease)   . Hypertension   . Obesity (BMI 30-39.9)     Past Medical History:  Diagnosis Date  . Acute acalculous cholecystitis s/p lap chole 04/03/2014 04/02/2014  . Allergy   . Anemia   . Anxiety   . Asthma   . Depression   . Fatty liver    noted on CT per [facility] records  . Fibromyalgia   . GERD (gastroesophageal reflux disease)   . Hyperlipidemia associated with type 2 diabetes mellitus ([place])   . Hypertension   . Hypothyroid 2015   Transiently in 2015. Synthroid 50mg was stopped 05/07/2016 w/ tsh nml following.  . Obesity (BMI 30-39.9)   . OSA (obstructive sleep apnea)    non-compliant with CPAP  . Seborrheic dermatitis    [facility]  . Stroke ([facility]    TIA  . Type 2 diabetes mellitus ([place]     Past Surgical History:  Procedure Laterality Date  . BREAST REDUCTION SURGERY  06/2008  . CHOLECYSTECTOMY N/A 04/03/2014   Procedure: LAPAROSCOPIC CHOLECYSTECTOMY ;  Surgeon: SAdin Hector MD;  Location: [facility];  Service: General;  Laterality: N/A;  . REDUCTION MAMMAPLASTY    . TUBAL LIGATION      Social History   Socioeconomic History  . Marital status: Divorced    Spouse name: Not on file  . Number of children: Not on file  . Years of education: Not on file  . Highest education level: Not on file  Occupational History  . Not on file  Social Needs  . Financial resource strain: Not on file  . Food insecurity:    Worry: Not on file    Inability: Not on file  . Transportation needs:    Medical: Not on file    Non-medical: Not on file  Tobacco Use  . Smoking status: Former Smoker    Types: Cigarettes    Last attempt to quit: 04/22/2004    Years since quitting: 14.4  . Smokeless tobacco: Never Used  . Tobacco comment: 4 pack year hx  Substance and Sexual Activity  . Alcohol use: No  . Drug use: No  . Sexual activity:  Never  Lifestyle  . Physical activity:    Days per week: Not on file    Minutes per session: Not on file  . Stress: Not on file  Relationships  . Social connections:    Talks on phone: Not on file    Gets together: Not on file    Attends religious service: Not on file    Active member of club or organization: Not on file  Attends meetings of clubs or organizations: Not on file    Relationship status: Not on file  . Intimate partner violence:    Fear of current or ex partner: Not on file    Emotionally abused: Not on file    Physically abused: Not on file    Forced sexual activity: Not on file  Other Topics Concern  . Not on file  Social History Narrative  . Not on file    Family History  Problem Relation Age of Onset  . Atrial fibrillation Mother   . Heart disease Father        CAD  . Diabetes Brother   . Cancer Maternal Grandmother   . Diabetes Paternal Grandmother   . Fibromyalgia Sister   . Colitis Sister      Review of Systems  Constitutional: Negative.  Negative for chills and fever.  HENT: Negative.  Negative for hearing loss.   Eyes: Negative.  Negative for blurred vision and double vision.  Respiratory: Negative.  Negative for cough and shortness of breath.   Cardiovascular: Negative.  Negative for chest pain and palpitations.  Gastrointestinal: Negative for abdominal pain, diarrhea, nausea and vomiting.  Genitourinary: Negative.  Negative for dysuria.  Skin: Negative.  Negative for rash.  Neurological: Negative.  Negative for dizziness and headaches.  Endo/Heme/Allergies: Negative.   All other systems reviewed and are negative.   Vitals:   10/09/18 1126  BP: 127/84  Pulse: (!) 107  Resp: 16  Temp: 98.8 F (37.1 C)  SpO2: 96%    Physical Exam Constitutional:      Appearance: She is obese.  HENT:     Head: Normocephalic and atraumatic.     Nose: Nose normal.     Mouth/Throat:     Mouth: Mucous membranes are moist.     Pharynx: Oropharynx  is clear.  Eyes:     Extraocular Movements: Extraocular movements intact.     Conjunctiva/sclera: Conjunctivae normal.     Pupils: Pupils are equal, round, and reactive to light.  Neck:     Musculoskeletal: Normal range of motion and neck supple.  Cardiovascular:     Rate and Rhythm: Normal rate and regular rhythm.     Heart sounds: Normal heart sounds.  Pulmonary:     Effort: Pulmonary effort is normal.     Breath sounds: Normal breath sounds.  Musculoskeletal: Normal range of motion.  Skin:    General: Skin is warm and dry.     Capillary Refill: Capillary refill takes less than 2 seconds.  Neurological:     General: No focal deficit present.     Mental Status: She is alert and oriented to person, place, and time.  Psychiatric:        Mood and Affect: Mood normal.        Behavior: Behavior normal.    Results for orders placed or performed in visit on 10/09/18 (from the past 24 hour(s))  POCT glucose (manual entry)     Status: Abnormal   Collection Time: 10/09/18 11:58 AM  Result Value Ref Range   POC Glucose 230 (A) 70 - 99 mg/dl  POCT glycosylated hemoglobin (Hb A1C)     Status: Abnormal   Collection Time: 10/09/18 12:04 PM  Result Value Ref Range   Hemoglobin A1C 9.5 (A) 4.0 - 5.6 %   HbA1c POC (<> result, manual entry)     HbA1c, POC (prediabetic range)     HbA1c, POC (controlled diabetic range)  A total of 25 minutes was spent in the room with the patient, greater than 50% of which was in counseling/coordination of care regarding diabetes, blood results, change of medications, diet and nutrition, need for evaluation by endocrinologist and need for follow-up with Dr. Brigitte Pulse in 2 months.   ASSESSMENT & PLAN: Type 2 diabetes mellitus ([place]) Uncontrolled diabetes with elevated hemoglobin A1c at 9.5 today.  Will add glipizide 5 mg to present regimen.  Will refer to endocrinologist as well.  Needs to follow-up with Dr. Brigitte Pulse in 2 months.  Subrena was seen today for  diabetes.  Diagnoses and all orders for this visit:  Type 2 diabetes mellitus with hyperglycemia, with long-term current use of insulin (HCC) -     Lipid panel -     Comprehensive metabolic panel -     POCT glucose (manual entry) -     POCT glycosylated hemoglobin (Hb A1C) -     Ambulatory referral to Endocrinology -     glipiZIDE (GLUCOTROL) 5 MG tablet; Take 1 tablet (5 mg total) by mouth daily before breakfast.    Patient Instructions       If you have lab work done today you will be contacted with your lab results within the next 2 weeks.  If you have not heard from [place] then please contact us. The fastest way to get your results is to register for My Chart.   IF you received an x-ray today, you will receive an invoice from [facility]. Please contact [facility] at [phone] with questions or concerns regarding your invoice.   IF you received labwork today, you will receive an invoice from [place]. Please contact [facility] at [phone] with questions or concerns regarding your invoice.   Our billing staff will not be able to assist you with questions regarding bills from these companies.  You will be contacted with the lab results as soon as they are available. The fastest way to get your results is to activate your My Chart account. Instructions are located on the last page of this paperwork. If you have not heard from [place] regarding the results in 2 weeks, please contact this office.     Diabetes Mellitus and Nutrition, Adult When you have diabetes (diabetes mellitus), it is very important to have healthy eating habits because your blood sugar (glucose) levels are greatly affected by what you eat and drink. Eating healthy foods in the appropriate amounts, at about the same times every day, can help you:  Control your blood glucose.  Lower your risk of heart disease.  Improve your blood pressure.  Reach or maintain a healthy weight. Every person  with diabetes is different, and each person has different needs for a meal plan. Your health care provider may recommend that you work with a diet and nutrition specialist (dietitian) to make a meal plan that is best for you. Your meal plan may vary depending on factors such as:  The calories you need.  The medicines you take.  Your weight.  Your blood glucose, blood pressure, and cholesterol levels.  Your activity level.  Other health conditions you have, such as heart or kidney disease. How do carbohydrates affect me? Carbohydrates, also called carbs, affect your blood glucose level more than any other type of food. Eating carbs naturally raises the amount of glucose in your blood. Carb counting is a method for keeping track of how many carbs you eat. Counting carbs is important to keep your blood glucose at  a healthy level, especially if you use insulin or take certain oral diabetes medicines. It is important to know how many carbs you can safely have in each meal. This is different for every person. Your dietitian can help you calculate how many carbs you should have at each meal and for each snack. Foods that contain carbs include:  Bread, cereal, rice, pasta, and crackers.  Potatoes and corn.  Peas, beans, and lentils.  Milk and yogurt.  Fruit and juice.  Desserts, such as cakes, cookies, ice cream, and candy. How does alcohol affect me? Alcohol can cause a sudden decrease in blood glucose (hypoglycemia), especially if you use insulin or take certain oral diabetes medicines. Hypoglycemia can be a life-threatening condition. Symptoms of hypoglycemia (sleepiness, dizziness, and confusion) are similar to symptoms of having too much alcohol. If your health care provider says that alcohol is safe for you, follow these guidelines:  Limit alcohol intake to no more than 1 drink per day for nonpregnant women and 2 drinks per day for men. One drink equals 12 oz of beer, 5 oz of wine, or  1 oz of hard liquor.  Do not drink on an empty stomach.  Keep yourself hydrated with water, diet soda, or unsweetened iced tea.  Keep in mind that regular soda, juice, and other mixers may contain a lot of sugar and must be counted as carbs. What are tips for following this plan?  Reading food labels  Start by checking the serving size on the "Nutrition Facts" label of packaged foods and drinks. The amount of calories, carbs, fats, and other nutrients listed on the label is based on one serving of the item. Many items contain more than one serving per package.  Check the total grams (g) of carbs in one serving. You can calculate the number of servings of carbs in one serving by dividing the total carbs by 15. For example, if a food has 30 g of total carbs, it would be equal to 2 servings of carbs.  Check the number of grams (g) of saturated and trans fats in one serving. Choose foods that have low or no amount of these fats.  Check the number of milligrams (mg) of salt (sodium) in one serving. Most people should limit total sodium intake to less than 2,300 mg per day.  Always check the nutrition information of foods labeled as "low-fat" or "nonfat". These foods may be higher in added sugar or refined carbs and should be avoided.  Talk to your dietitian to identify your daily goals for nutrients listed on the label. Shopping  Avoid buying canned, premade, or processed foods. These foods tend to be high in fat, sodium, and added sugar.  Shop around the outside edge of the grocery store. This includes fresh fruits and vegetables, bulk grains, fresh meats, and fresh dairy. Cooking  Use low-heat cooking methods, such as baking, instead of high-heat cooking methods like deep frying.  Cook using healthy oils, such as olive, canola, or sunflower oil.  Avoid cooking with butter, cream, or high-fat meats. Meal planning  Eat meals and snacks regularly, preferably at the same times every  day. Avoid going long periods of time without eating.  Eat foods high in fiber, such as fresh fruits, vegetables, beans, and whole grains. Talk to your dietitian about how many servings of carbs you can eat at each meal.  Eat 4-6 ounces (oz) of lean protein each day, such as lean meat, chicken, fish, eggs, or tofu.  One oz of lean protein is equal to: ? 1 oz of meat, chicken, or fish. ? 1 egg. ?  cup of tofu.  Eat some foods each day that contain healthy fats, such as avocado, nuts, seeds, and fish. Lifestyle  Check your blood glucose regularly.  Exercise regularly as told by your health care provider. This may include: ? 150 minutes of moderate-intensity or vigorous-intensity exercise each week. This could be brisk walking, biking, or water aerobics. ? Stretching and doing strength exercises, such as yoga or weightlifting, at least 2 times a week.  Take medicines as told by your health care provider.  Do not use any products that contain nicotine or tobacco, such as cigarettes and e-cigarettes. If you need help quitting, ask your health care provider.  Work with a Social worker or diabetes educator to identify strategies to manage stress and any emotional and social challenges. Questions to ask a health care provider  Do I need to meet with a diabetes educator?  Do I need to meet with a dietitian?  What number can I call if I have questions?  When are the best times to check my blood glucose? Where to find more information:  American Diabetes Association: diabetes.org  Academy of Nutrition and Dietetics: www.eatright.[employer] of Diabetes and Digestive and Kidney Diseases (NIH): [employer] Summary  A healthy meal plan will help you control your blood glucose and maintain a healthy lifestyle.  Working with a diet and nutrition specialist (dietitian) can help you make a meal plan that is best for you.  Keep in mind that carbohydrates (carbs) and alcohol  have immediate effects on your blood glucose levels. It is important to count carbs and to use alcohol carefully. This information is not intended to replace advice given to you by your health care provider. Make sure you discuss any questions you have with your health care provider. Document Released: 06/10/2005 Document Revised: 04/13/2017 Document Reviewed: 10/18/2016 Elsevier Interactive Patient Education  2019 Elsevier Inc.      Agustina Caroli, MD [facility]

## 2018-10-09 NOTE — Telephone Encounter (Signed)
Spoke with pt and she advised me that she has received her coupon.

## 2018-10-10 LAB — LIPID PANEL
Chol/HDL Ratio: 4 ratio (ref 0.0–4.4)
Cholesterol, Total: 174 mg/dL (ref 100–199)
HDL: 44 mg/dL (ref >39)
LDL CALC: 88 mg/dL (ref 0–99)
TRIGLYCERIDES: 210 mg/dL — AB (ref 0–149)
VLDL Cholesterol Cal: 42 mg/dL — ABNORMAL HIGH (ref 5–40)

## 2018-10-10 LAB — COMPREHENSIVE METABOLIC PANEL
A/G RATIO: 1.7 (ref 1.2–2.2)
ALBUMIN: 4.6 g/dL (ref 3.6–4.8)
ALT: 55 IU/L — ABNORMAL HIGH (ref 0–32)
AST: 57 IU/L — ABNORMAL HIGH (ref 0–40)
Alkaline Phosphatase: 112 IU/L (ref 39–117)
BUN / CREAT RATIO: 17 (ref 12–28)
BUN: 13 mg/dL (ref 8–27)
Bilirubin Total: 0.4 mg/dL (ref 0.0–1.2)
CO2: 18 mmol/L — AB (ref 20–29)
CREATININE: 0.76 mg/dL (ref 0.57–1.00)
Calcium: 10 mg/dL (ref 8.7–10.3)
Chloride: 97 mmol/L (ref 96–106)
GFR calc Af Amer: 98 mL/min/{1.73_m2} (ref >59)
GFR calc non Af Amer: 85 mL/min/{1.73_m2} (ref >59)
GLOBULIN, TOTAL: 2.7 g/dL (ref 1.5–4.5)
Glucose: 225 mg/dL — ABNORMAL HIGH (ref 65–99)
POTASSIUM: 3.9 mmol/L (ref 3.5–5.2)
SODIUM: 137 mmol/L (ref 134–144)
Total Protein: 7.3 g/dL (ref 6.0–8.5)

## 2018-10-11 ENCOUNTER — Other Ambulatory Visit: Payer: Self-pay | Admitting: Family Medicine

## 2018-10-16 ENCOUNTER — Other Ambulatory Visit: Payer: Self-pay | Admitting: Family Medicine

## 2018-10-16 MED ORDER — DULAGLUTIDE 1.5 MG/0.5ML ~~LOC~~ SOAJ: SUBCUTANEOUS | 0 refills | Status: DC

## 2018-10-16 NOTE — Telephone Encounter (Signed)
Requested Prescriptions  Pending Prescriptions Disp Refills  . Dulaglutide (TRULICITY) 1.5 PV/3.7SM SOPN 4 pen 0    Sig: INJECT 1.5 MG INTO THE SKIN ONCE A WEEK.     Endocrinology:  Diabetes - GLP-1 Receptor Agonists Failed - 10/16/2018  2:47 PM      Failed - HBA1C is between 0 and 7.9 and within 180 days    Hemoglobin A1C  Date Value Ref Range Status  10/09/2018 9.5 (A) 4.0 - 5.6 % Final   Hgb A1c MFr Bld  Date Value Ref Range Status  04/03/2014 7.9 (H) <5.7 % Final    Comment:    (NOTE)                                                                       According to the ADA Clinical Practice Recommendations for 2011, when HbA1c is used as a screening test:  >=6.5%   Diagnostic of Diabetes Mellitus           (if abnormal result is confirmed) 5.7-6.4%   Increased risk of developing Diabetes Mellitus References:Diagnosis and Classification of Diabetes Mellitus,Diabetes OLMB,8675,44(BEEFE 1):S62-S69 and Standards of Medical Care in         Diabetes - 2011,Diabetes OFHQ,1975,88 (Suppl 1):S11-S61.         Passed - Valid encounter within last 6 months    Recent Outpatient Visits          1 week ago Type 2 diabetes mellitus with hyperglycemia, with long-term current use of insulin [facility])   Primary Care at [facility], [place], MD   3 months ago Type 2 diabetes mellitus with hyperglycemia, with long-term current use of insulin [facility])   Primary Care at [facility], [place], MD   9 months ago Type 2 diabetes mellitus with complication, unspecified whether long term insulin use [facility])   Primary Care at [facility], Laurey Arrow, MD   10 months ago Elevated ferritin level   [facility] at [facility], Laurey Arrow, MD   11 months ago Elevated ferritin level   Primary Care at [facility], Laurey Arrow, MD      Future Appointments            In 1 month Brigitte Pulse Laurey Arrow, MD Primary Care at [place], [facility]

## 2018-10-16 NOTE — Telephone Encounter (Signed)
Copied from [place]. Topic: Quick Communication - Rx Refill/Question >> Oct 16, 2018  2:15 PM Windy Kalata wrote: Medication: TRULICITY 1.5 EW/2.5RK SOPN  Has the patient contacted their pharmacy? Yes.   (Agent: If no, request that the patient contact the pharmacy for the refill.) (Agent: If yes, when and what did the pharmacy advise?)  Preferred Pharmacy (with phone number or street name): [id number] [phone](Phone) [phone](Fax)    Agent: Please be advised that RX refills may take up to 3 business days. We ask that you follow-up with your pharmacy.

## 2018-11-07 ENCOUNTER — Ambulatory Visit: Payer: BLUE CROSS/BLUE SHIELD | Admitting: Internal Medicine

## 2018-11-13 ENCOUNTER — Other Ambulatory Visit: Payer: Self-pay | Admitting: Family Medicine

## 2018-11-13 ENCOUNTER — Ambulatory Visit: Payer: Self-pay | Admitting: *Deleted

## 2018-11-13 MED ORDER — DULAGLUTIDE 1.5 MG/0.5ML ~~LOC~~ SOAJ: SUBCUTANEOUS | 4 refills | Status: DC

## 2018-11-13 NOTE — Telephone Encounter (Signed)
Copied from [place] [phone]. Topic: Quick Communication - Rx Refill/Question >> Nov 13, 2018  9:01 AM Gustavus Messing wrote: Medication: Dulaglutide (TRULICITY) 1.5 JW/0.9LY [facility] [[id number]]   Has the patient contacted their pharmacy? Yes.    (Agent: If yes, when and what did the pharmacy advise?) Needs a new prescription.  Preferred Pharmacy (with phone number or street name): [id number] [phone](Phone) [phone](Fax)    Agent: Please be advised that RX refills may take up to 3 business days. We ask that you follow-up with your pharmacy.

## 2018-11-13 NOTE — Telephone Encounter (Signed)
Pt reports started Glipizide 10/09/2018. States worsening depression ,anxiety and thoughts of suicide after taking. Stopped taking 10/14/2018. Denies any further thoughts of suicide. States "Feel much better." Reports BS has been running 170's- 180's. States "That med is interfering with my depression and anxiety meds." Has not taken Glucotrol since 10/14/2018. Please advise: [phone]  Reason for Disposition . Caller has NON-URGENT medication question about med that PCP prescribed and triager unable to answer question  Answer Assessment - Initial Assessment Questions 1. SYMPTOMS: "Do you have any symptoms?"     Yes, not presently 2. SEVERITY: If symptoms are present, ask "Are they mild, moderate or severe?"  Protocols used: MEDICATION QUESTION CALL-A-AH

## 2018-11-14 NOTE — Telephone Encounter (Signed)
Spoke with pt advised she will need to come in to be seen so she and provider can decide the next steps in lowering her bs's with another medication.  Pt advises she can't come in due to her brakes being out on her car.  I asked if she could get someone to bring her and she advises she has an appt in May.  I advised this would be to long to be off medication without being prescribed and alternative and bs's will only increase.  Pt advises she will call office when she can come in.  I advised I would speak to provider and call her back. Tried calling pt back no answer but left voicemail to call office and schedule appt so the issues with glipizide(side effects) can be addressed and new medication given. Dgaddy, CMA

## 2018-11-21 ENCOUNTER — Telehealth: Payer: Self-pay | Admitting: Family Medicine

## 2018-11-21 NOTE — Telephone Encounter (Signed)
Copied from [place] [phone]. Topic: Quick Communication - Rx Refill/Question >> Nov 21, 2018 10:08 AM Gustavus Messing wrote: Medication:Insulin Glargine (TOUJEO MAX SOLOSTAR) 300 UNIT/ML SOPN to change from 75 to 120 MG  Has the patient contacted their pharmacy? No.  (Agent: If yes, when and what did the pharmacy advise?) Patient wants the injections changed from 75 to 120 MG  Preferred Pharmacy (with phone number or street name): [id number] [phone](Phone) [phone](Fax)    Agent: Please be advised that RX refills may take up to 3 business days. We ask that you follow-up with your pharmacy.

## 2018-11-28 ENCOUNTER — Ambulatory Visit: Payer: BLUE CROSS/BLUE SHIELD | Admitting: Internal Medicine

## 2018-11-28 ENCOUNTER — Telehealth: Payer: Self-pay | Admitting: Family Medicine

## 2018-11-28 ENCOUNTER — Other Ambulatory Visit: Payer: Self-pay | Admitting: Family Medicine

## 2018-11-28 NOTE — Telephone Encounter (Signed)
mychart message sent to pt about their appointment with Dr Shaw °

## 2018-12-08 ENCOUNTER — Encounter: Payer: BLUE CROSS/BLUE SHIELD | Admitting: Family Medicine

## 2018-12-11 NOTE — Telephone Encounter (Signed)
Patient needs Dr. Pamella Pert to know she take 125 units and not 75 units of Tujeo. Pharamcy will be requesting soon.

## 2018-12-12 NOTE — Telephone Encounter (Signed)
Please advise as units has changed.

## 2018-12-12 NOTE — Telephone Encounter (Signed)
Pt is calling back stating the pharmacy needs a new RX with 125 units.

## 2018-12-13 NOTE — Telephone Encounter (Signed)
Spoke with pt and she advises she is adjusting her own insulin (tujeo) pt is using a 120 units.  Advised new rx cannot be given for 125 units because insulin dosing was not adjusted by provider but by her.  Advised if tujeo is not working for as she says she will need to address this with Endocrinologist on 12/25/2018.  Pt advises lantus has worked in the past I advised to bring this up to MD at appt.  Pt advises she has tujeo refill at the pharmacy and does have enough to make it to appt with endocrinologist. Pt agreeable. Dgaddy, CMA

## 2018-12-15 NOTE — Telephone Encounter (Signed)
Please let the patient know to inject up to 125 units of tujeo. Refill sent in.

## 2018-12-15 NOTE — Addendum Note (Signed)
Addended by: Delia Chimes A on: 12/15/2018 02:48 PM   Modules accepted: Orders

## 2018-12-19 ENCOUNTER — Other Ambulatory Visit: Payer: Self-pay | Admitting: Family Medicine

## 2018-12-22 ENCOUNTER — Other Ambulatory Visit: Payer: Self-pay

## 2018-12-25 ENCOUNTER — Encounter: Payer: Self-pay | Admitting: Internal Medicine

## 2018-12-25 ENCOUNTER — Other Ambulatory Visit: Payer: Self-pay

## 2018-12-25 ENCOUNTER — Ambulatory Visit (INDEPENDENT_AMBULATORY_CARE_PROVIDER_SITE_OTHER): Payer: BLUE CROSS/BLUE SHIELD | Admitting: Internal Medicine

## 2018-12-25 VITALS — BP 120/78 | HR 110 | Ht 64.25 in | Wt 192.0 lb

## 2018-12-25 DIAGNOSIS — N6310 Unspecified lump in the right breast, unspecified quadrant: Secondary | ICD-10-CM

## 2018-12-25 DIAGNOSIS — Z794 Long term (current) use of insulin: Secondary | ICD-10-CM | POA: Diagnosis not present

## 2018-12-25 DIAGNOSIS — E1165 Type 2 diabetes mellitus with hyperglycemia: Secondary | ICD-10-CM | POA: Diagnosis not present

## 2018-12-25 DIAGNOSIS — E1142 Type 2 diabetes mellitus with diabetic polyneuropathy: Secondary | ICD-10-CM

## 2018-12-25 LAB — BASIC METABOLIC PANEL
BUN: 13 mg/dL (ref 6–23)
CO2: 29 mEq/L (ref 19–32)
Calcium: 9.8 mg/dL (ref 8.4–10.5)
Chloride: 99 mEq/L (ref 96–112)
Creatinine, Ser: 0.79 mg/dL (ref 0.40–1.20)
GFR: 73.78 mL/min (ref >60.00)
Glucose, Bld: 347 mg/dL — ABNORMAL HIGH (ref 70–99)
Potassium: 4.1 mEq/L (ref 3.5–5.1)
Sodium: 136 mEq/L (ref 135–145)

## 2018-12-25 MED ORDER — DAPAGLIFLOZIN PROPANEDIOL 5 MG PO TABS: 5.0000 mg | ORAL_TABLET | Freq: Every day | ORAL | 6 refills | Status: DC

## 2018-12-25 MED ORDER — METFORMIN HCL ER 500 MG PO TB24: 1000.0000 mg | ORAL_TABLET | Freq: Two times a day (BID) | ORAL | 11 refills | Status: DC

## 2018-12-25 NOTE — Progress Notes (Signed)
Released this order in error MM Diag Breast TOMO UNI Right.

## 2018-12-25 NOTE — Patient Instructions (Signed)
-   Decrease Toujeo to 80 units daily  - Increase Metformin 750 mg Twice a day (breakfast and supper), once you are done with this prescription, please pick up the Metformin 500 mg XR and take 2 tablets with Breakfast and 2 tablet with supper.  - Continue Trulicity 1.5 mg weekly  - Start Farxiga 5 mg daily    - Check sugar twice a day (fasting and bedtime)  - Choose healthy, lower carb lower calorie snacks: toss salad, cooked vegetables, cottage cheese, peanut butter, low fat cheese / string cheese, lower sodium deli meat, tuna salad or chicken salad    - HOW TO TREAT LOW BLOOD SUGARS (Blood sugar LESS THAN 70 MG/DL)  Please follow the RULE OF 15 for the treatment of hypoglycemia treatment (when your (blood sugars are less than 70 mg/dL)    STEP 1: Take 15 grams of carbohydrates when your blood sugar is low, which includes:   3-4 GLUCOSE TABS  OR  3-4 OZ OF JUICE OR REGULAR SODA OR  ONE TUBE OF GLUCOSE GEL     STEP 2: RECHECK blood sugar in 15 MINUTES STEP 3: If your blood sugar is still low at the 15 minute recheck --> then, go back to STEP 1 and treat AGAIN with another 15 grams of carbohydrates.

## 2018-12-25 NOTE — Progress Notes (Signed)
Name: Jeanette Rose  MRN/ DOB: [id number], April 25, 1956   Age/ Sex: 63 y.o., female    PCP: Shawnee Knapp, MD   Reason for Endocrinology Evaluation: Type 2 Diabetes Mellitus     Date of Initial Endocrinology Visit: 12/26/2018     PATIENT IDENTIFIER: Jeanette Rose is a 63 y.o. female with a past medical history of HTN, T2DM, OSA, Depression and Hx of CVA  . The patient presented for initial endocrinology clinic visit on 12/26/2018 for consultative assistance with her diabetes management.    HPI: Jeanette Rose was    Diagnosed with T2DM in 2007 Prior Medications tried/Intolerance: Onglyza - persistent hyperglycemia, insulin started 2 month after diagnosis. Glipizide- anxiety  Currently checking blood sugars 1 x / day,  before breakfast   Hypoglycemia episodes : no     Hemoglobin A1c has ranged from 7.9 % in 2015, peaking at 9.5% in 2020. Patient required assistance for hypoglycemia: no Patient has required hospitalization within the last 1 year from hyper or hypoglycemia: no  In terms of diet, the patient drinks regular soda, she eats meals and snacks.    HOME DIABETES REGIMEN: Metformin 583 XR daily  Trulicity 1.5 mg weekly  Toujeo 240 units daily   Statin: yes ACE-I/ARB: yes Prior Diabetic Education: no   GLUCOSE LOG : BG range 173- 404 mg/dL   DIABETIC COMPLICATIONS: Microvascular complications:   Neuropathy   Denies: CKD, neuropathy   Last eye exam: Completed 01/2018  Macrovascular complications:    Denies: CAD, PVD, CVA (denies CVA despite chart documentation)   PAST HISTORY: Past Medical History:  Past Medical History:  Diagnosis Date  . Acute acalculous cholecystitis s/p lap chole 04/03/2014 04/02/2014  . Allergy   . Anemia   . Anxiety   . Asthma   . Depression   . Fatty liver    noted on CT per [facility] records  . Fibromyalgia   . GERD (gastroesophageal reflux disease)   . Hyperlipidemia associated with type 2 diabetes mellitus ([place])   . Hypertension    . Hypothyroid 2015   Transiently in 2015. Synthroid 61mg was stopped 05/07/2016 w/ tsh nml following.  . Obesity (BMI 30-39.9)   . OSA (obstructive sleep apnea)    non-compliant with CPAP  . Seborrheic dermatitis    [facility]  . Stroke ([facility]    TIA  . Type 2 diabetes mellitus ([place]    Past Surgical History:  Past Surgical History:  Procedure Laterality Date  . BREAST REDUCTION SURGERY  06/2008  . CHOLECYSTECTOMY N/A 04/03/2014   Procedure: LAPAROSCOPIC CHOLECYSTECTOMY ;  Surgeon: SAdin Hector MD;  Location: [facility]S;  Service: General;  Laterality: N/A;  . REDUCTION MAMMAPLASTY    . TUBAL LIGATION        Social History:  reports that she quit smoking about 14 years ago. Her smoking use included cigarettes. She has never used smokeless tobacco. She reports that she does not drink alcohol or use drugs. Family History:  Family History  Problem Relation Age of Onset  . Atrial fibrillation Mother   . Heart disease Father        CAD  . Diabetes Brother   . Cancer Maternal Grandmother   . Diabetes Paternal Grandmother   . Fibromyalgia Sister   . Colitis Sister      HOME MEDICATIONS: Allergies as of 12/25/2018   No Known Allergies     Medication List  Accurate as of December 25, 2018 11:59 PM. Always use your most recent med list.        AMANTADINE HCL PO Take 100 mg by mouth 2 (two) times daily.   Aspirin Low Dose 81 MG tablet Generic drug:  aspirin   atorvastatin 80 MG tablet Commonly known as:  LIPITOR TAKE 1 TABLET (80 MG TOTAL) BY MOUTH DAILY AT 6 PM.   cetirizine 10 MG tablet Commonly known as:  ZYRTEC Take 10 mg by mouth daily.   dapagliflozin propanediol 5 MG Tabs tablet Commonly known as:  Farxiga Take 5 mg by mouth daily.   Dulaglutide 1.5 MG/0.5ML Sopn Commonly known as:  Trulicity INJECT 1.5 MG INTO THE SKIN ONCE A WEEK.   freestyle lancets 1 Units by Does not apply route 4 (four) times daily.    linaclotide 145 MCG Caps capsule Commonly known as:  Linzess Take 1 capsule (145 mcg total) by mouth daily before breakfast. **NEED GI VISIT FOR ANY ADDITIONAL REFILLS**   LORazepam 1 MG tablet Commonly known as:  ATIVAN Take 1 mg by mouth every 8 (eight) hours as needed for anxiety.   metFORMIN 500 MG 24 hr tablet Commonly known as:  Glucophage XR Take 2 tablets (1,000 mg total) by mouth 2 (two) times daily with a meal.   olmesartan 20 MG tablet Commonly known as:  BENICAR TAKE 1 TABLET BY MOUTH EVERY DAY   omeprazole 40 MG capsule Commonly known as:  PRILOSEC TAKE 1 CAPSULE (40 MG TOTAL) BY MOUTH DAILY. 30 MINUTES BEFORE A MEAL.   polyethylene glycol powder powder Commonly known as:  GLYCOLAX/MIRALAX Take 15-30 g by mouth daily.   Rexulti 0.5 MG Tabs Generic drug:  Brexpiprazole Take by mouth daily.   Toujeo Max SoloStar 300 UNIT/ML Sopn Generic drug:  Insulin Glargine (2 Unit Dial) Inject 80 Units into the skin daily. Inject 124 units under the skin once daily.   Trintellix 20 MG Tabs tablet Generic drug:  vortioxetine HBr Take 20 mg by mouth daily.   VITAMIN B-12 CR PO Take 1 tablet by mouth daily.   VITAMIN D (CHOLECALCIFEROL) PO Take 2,000 Units by mouth daily.   Vitamin D (Ergocalciferol) 1.25 MG (50000 UT) Caps capsule Commonly known as:  DRISDOL TAKE 1 CAPSULE (50,000 UNITS TOTAL) BY MOUTH EVERY 7 (SEVEN) DAYS.        ALLERGIES: No Known Allergies   REVIEW OF SYSTEMS: A comprehensive ROS was conducted with the patient and is negative except as per HPI and below:  Review of Systems  Constitutional: Negative for fever and weight loss.  HENT: Positive for congestion. Negative for sore throat.   Eyes: Negative for blurred vision and pain.  Respiratory: Negative for cough and shortness of breath.   Cardiovascular: Negative for chest pain and palpitations.  Gastrointestinal: Negative for diarrhea and nausea.  Genitourinary: Negative for frequency.   Neurological: Positive for tingling. Negative for tremors.  Endo/Heme/Allergies: Negative for polydipsia.  Psychiatric/Behavioral: Positive for depression. The patient is not nervous/anxious.       OBJECTIVE:   VITAL SIGNS: BP 120/78 (BP Location: Left Arm, Patient Position: Sitting, Cuff Size: Normal)   Pulse (!) 110   Ht 5' 4.25" (1.632 m)   Wt 192 lb (87.1 kg)   SpO2 97%   BMI 32.70 kg/m    PHYSICAL EXAM:  General: Pt appears well and is in NAD  Hydration: Well-hydrated with moist mucous membranes and good skin turgor  HEENT: Head: Unremarkable with good dentition. Oropharynx  clear without exudate.  Eyes: External eye exam normal without stare, lid lag or exophthalmos.  EOM intact.    Neck: General: Supple without adenopathy or carotid bruits. Thyroid: Thyroid size normal.  No goiter or nodules appreciated. No thyroid bruit.  Lungs: Clear with good BS bilat with no rales, rhonchi, or wheezes  Heart: RRR with normal S1 and S2 and no gallops; no murmurs; no rub  Abdomen: Normoactive bowel sounds, soft, nontender, without masses or organomegaly palpable  Extremities:  Lower extremities - No pretibial edema. No lesions.  Skin: Normal texture and temperature to palpation. No rash noted. No Acanthosis nigricans/skin tags. No lipohypertrophy.  Neuro: MS is good with appropriate affect, pt is alert and Ox3    DM FOOT EXAM : 12/25/18 The skin of the feet is intact without sores or ulcerations. B/L plantar calouses noted  The pedal pulses are 2+ on right and 2+ on left. The sensation is intact to a screening 5.07, 10 gram monofilament bilaterally   DATA REVIEWED:  Lab Results  Component Value Date   HGBA1C 9.5 (A) 10/09/2018   HGBA1C 8.1 (A) 06/28/2018   HGBA1C 9.2 01/05/2018   Lab Results  Component Value Date   LDLCALC 88 10/09/2018   CREATININE 0.79 12/25/2018    Lab Results  Component Value Date   CHOL 174 10/09/2018   HDL 44 10/09/2018   LDLCALC 88 10/09/2018    TRIG 210 (H) 10/09/2018   CHOLHDL 4.0 10/09/2018    Results for FOREST, PRUDEN (MRN [id number]) as of 12/26/2018 07:35  Ref. Range 12/25/2018 14:21  Sodium Latest Ref Range: 135 - 145 mEq/L 136  Potassium Latest Ref Range: 3.5 - 5.1 mEq/L 4.1  Chloride Latest Ref Range: 96 - 112 mEq/L 99  CO2 Latest Ref Range: 19 - 32 mEq/L 29  Glucose Latest Ref Range: 70 - 99 mg/dL 347 (H)  BUN Latest Ref Range: 6 - 23 mg/dL 13  Creatinine Latest Ref Range: 0.40 - 1.20 mg/dL 0.79  Calcium Latest Ref Range: 8.4 - 10.5 mg/dL 9.8  GFR Latest Ref Range: >60.00 mL/min 73.78      ASSESSMENT / PLAN / RECOMMENDATIONS:   1) Type 2 Diabetes Mellitus, poorly controlled, With neuropathic complications - Most recent A1c of 9.5 %. Goal A1c < 7.0 %.   Plan: GENERAL:  Poorly controlled diabetes is due to dietary indiscretions   She is currently on 240 units daily of toujeo. We discussed the pharmacokinetics of long acting insulin and how it will not cover post-prandial hyperglycemia.   We discussed the importance of following a low CHO diet.   Pt is on too much basal insulin per weight requirements, we discussed maximizing metformin dose as below , discussed risk of nausea and diarrhea   She would like to change truclity to ozempic, I explained to her they work the sam and we need to this at future appointments as they could cause similar side effects to metformin.   We discussed starting SGLT-2 inhibitors , we discussed risk of genital infection and also the benefit of improved glycemic control as well as weight loss and improved BP.    MEDICATIONS:  Decrease Toujeo to 80 units daily  Change Metformin to 500 mg XR 2 tablets BID, in the meantime she can take 2 tablets of the 750 mg daily  Continue Trulicity 1.5 mg weekly  Start Farxiga 5 mg daily   EDUCATION / INSTRUCTIONS:  BG monitoring instructions: Patient is instructed to check her blood sugars 2  times a day, fasting and bedtime.  Call [place]  [facility] clinic if: BG persistently < 70 or > 300. . I reviewed the Rule of 15 for the treatment of hypoglycemia in detail with the patient. Literature supplied.   2) Diabetic complications:   Eye: Does not have known diabetic retinopathy.   Neuro/ Feet: Does  have known diabetic peripheral neuropathy.  Renal: Patient does not have known baseline CKD. She is on an ACEI/ARB at present.   3) Lipids: Patient is  on a statin.    4) Hypertension: she is   at goal of < 140/90 mmHg.     F/u in 6 weeks   Signed electronically by: Mack Guise, MD  [facility]  [facility] [address] [place], [place] [address] Phone: [phone] FAX: [phone]   CC: Shawnee Knapp, [address] [place] [address] Phone: [phone]  Fax: [phone]    Return to Endocrinology clinic as below: Future Appointments  Date Time Provider [place]  01/26/2019  1:20 PM Rutherford Guys, MD PCP-PCP [facility]  02/05/2019 11:10 AM Katelin Kutsch, Melanie Crazier, MD [facility] None

## 2018-12-28 ENCOUNTER — Encounter: Payer: Self-pay | Admitting: Dietician

## 2018-12-28 ENCOUNTER — Other Ambulatory Visit: Payer: Self-pay

## 2018-12-28 ENCOUNTER — Encounter: Payer: BLUE CROSS/BLUE SHIELD | Admitting: Family Medicine

## 2018-12-28 ENCOUNTER — Encounter: Payer: BLUE CROSS/BLUE SHIELD | Attending: Internal Medicine | Admitting: Dietician

## 2018-12-28 DIAGNOSIS — E1142 Type 2 diabetes mellitus with diabetic polyneuropathy: Secondary | ICD-10-CM | POA: Diagnosis not present

## 2018-12-28 DIAGNOSIS — Z794 Long term (current) use of insulin: Secondary | ICD-10-CM | POA: Diagnosis present

## 2018-12-28 NOTE — Patient Instructions (Signed)
Rethink what you drink.  Drink beverages without carbohydrate.  Water  Unsweetened tea with truvia  Occasional diet soda Drink more water to stay well hydrated. Consider exercise (use your total gym) or find a youtube video for armchair exercises or walk.  Start slowly and increase slowly to goal of 30 minutes most days. 1/2 your plate non starchy vegetables  Aim for 2-3 Carb Choices per meal (30-45 grams) +/- 1 either way  Aim for 0-15 Carbs per snack if hungry  Include protein in moderation with your meals and snacks Consider reading food labels for Total Carbohydrate of foods Consider checking your blood sugar 2 times daily Continue taking medication as directed by MD

## 2018-12-28 NOTE — Progress Notes (Signed)
Diabetes Self-Management Education  Visit Type: First/Initial  Appt. Start Time: 1515 Appt. End Time: 1630  12/29/2018  [name], identified by name and date of birth, is a 63 y.o. female with a diagnosis of Diabetes: Type 2.   ASSESSMENT Patient is here today alone. History includes type 2 diabetes, vitamin D deficiency, HTN, CVA, fatty liver and OSA but does not use C-pap as she wakes several times when she wears this. A1C 9.5% 10/09/18 increased from 8.1% 06/28/18 She drinks regular soda and no water.  Her total fluid intake is poor. She has decreased portions and decreased her sweet intake since seeing Dr. Kelton Pillar but continues regular soda. She has begun to see the effects of food on her BG (waffle with syrup- BG 404) She does not exercise and does not have motivation to do so.  Medications include:  Farxiga (new), Trulicity, Metformin, andf Toujeo 80 units q am  Weight hx: 190 lbs Always been overweight. Lost weight in the past with exercise and diet but it always "creaps back".  Patient lives alone.  She has her son's [place] since her son died.  She helps her 74 yo mother at times and is sad that she was recently told she should not visit her due to Covid-19.  She is on disability.  She worked at [employer] in the past. States that she used to exercise but has increased foot pain and has not exercised for about 3 years.   She used to enjoy line dancing.   History of depression and sees a psychiatrist. Height 5' 4.25" (1.632 m), weight 190 lb (86.2 kg). Body mass index is 32.36 kg/m.  Diabetes Self-Management Education - 12/28/18 1539      Visit Information   Visit Type  First/Initial      Initial Visit   Diabetes Type  Type 2    Are you currently following a meal plan?  No    Are you taking your medications as prescribed?  Yes    Date Diagnosed  2007      Health Coping   How would you rate your overall health?  Fair      Psychosocial Assessment    Patient Belief/Attitude about Diabetes  Afraid    Self-care barriers  None    Self-management support  Doctor's office;Family    Other persons present  Patient    Patient Concerns  Nutrition/Meal planning;Glycemic Control    Special Needs  None    Preferred Learning Style  No preference indicated    Learning Readiness  Ready    How often do you need to have someone help you when you read instructions, pamphlets, or other written materials from your doctor or pharmacy?  1 - Never    What is the last grade level you completed in school?  GED      Pre-Education Assessment   Patient understands the diabetes disease and treatment process.  Needs Instruction    Patient understands incorporating nutritional management into lifestyle.  Needs Instruction    Patient undertands incorporating physical activity into lifestyle.  Needs Instruction    Patient understands using medications safely.  Needs Instruction    Patient understands monitoring blood glucose, interpreting and using results  Needs Instruction    Patient understands prevention, detection, and treatment of acute complications.  Needs Instruction    Patient understands prevention, detection, and treatment of chronic complications.  Needs Instruction    Patient understands how to develop strategies to address  psychosocial issues.  Needs Instruction    Patient understands how to develop strategies to promote health/change behavior.  Needs Instruction      Complications   Last HgB A1C per patient/outside source  9.5 %   10/09/2018 increased from 8.1% 06/2018   How often do you check your blood sugar?  1-2 times/day    Fasting Blood glucose range (mg/dL)  70-129    Postprandial Blood glucose range (mg/dL)  180-200    Number of hypoglycemic episodes per month  0    Number of hyperglycemic episodes per week  3    Can you tell when your blood sugar is high?  No    Have you had a dilated eye exam in the past 12 months?  No    Have you had a  dental exam in the past 12 months?  No    Are you checking your feet?  Yes    How many days per week are you checking your feet?  3      Dietary Intake   Breakfast  oranges OR cheerios (honey nut)- dry OR yogurt   9:30   Snack (morning)  none    Lunch  similar to dinner   12:30-1   Snack (afternoon)  none    Dinner  [employer] OR [employer], crackers OR chili with crackers OR just oranges   5-6   Snack (evening)  none    Beverage(s)  regular Dr. Malachi Bonds (20 ounces per day) and no other liquids      Exercise   Exercise Type  ADL's    How many days per week to you exercise?  0    How many minutes per day do you exercise?  0    Total minutes per week of exercise  0      Patient Education   Previous Diabetes Education  No    Disease state   Definition of diabetes, type 1 and 2, and the diagnosis of diabetes    Nutrition management   Role of diet in the treatment of diabetes and the relationship between the three main macronutrients and blood glucose level;Food label reading, portion sizes and measuring food.;Meal options for control of blood glucose level and chronic complications.;Meal timing in regards to the patients' current diabetes medication.;Carbohydrate counting    Physical activity and exercise   Role of exercise on diabetes management, blood pressure control and cardiac health.;Helped patient identify appropriate exercises in relation to his/her diabetes, diabetes complications and other health issue.    Medications  Reviewed patients medication for diabetes, action, purpose, timing of dose and side effects.    Monitoring  Purpose and frequency of SMBG.;Daily foot exams;Yearly dilated eye exam;Identified appropriate SMBG and/or A1C goals.    Acute complications  Taught treatment of hypoglycemia - the 15 rule.    Chronic complications  Relationship between chronic complications and blood glucose control    Psychosocial adjustment  Worked with patient to identify barriers to  care and solutions;Role of stress on diabetes    Personal strategies to promote health  Lifestyle issues that need to be addressed for better diabetes care      Individualized Goals (developed by patient)   Nutrition  General guidelines for healthy choices and portions discussed;Follow meal plan discussed    Physical Activity  Exercise 5-7 days per week;30 minutes per day    Medications  take my medication as prescribed    Monitoring   test my blood glucose as  discussed    Reducing Risk  examine blood glucose patterns    Health Coping  discuss diabetes with (comment)   MD, RD, CDCES     Post-Education Assessment   Patient understands the diabetes disease and treatment process.  Demonstrates understanding / competency    Patient understands incorporating nutritional management into lifestyle.  Needs Review    Patient undertands incorporating physical activity into lifestyle.  Demonstrates understanding / competency    Patient understands using medications safely.  Demonstrates understanding / competency    Patient understands monitoring blood glucose, interpreting and using results  Demonstrates understanding / competency    Patient understands prevention, detection, and treatment of acute complications.  Demonstrates understanding / competency    Patient understands prevention, detection, and treatment of chronic complications.  Demonstrates understanding / competency    Patient understands how to develop strategies to address psychosocial issues.  Demonstrates understanding / competency    Patient understands how to develop strategies to promote health/change behavior.  Needs Review      Outcomes   Expected Outcomes  Demonstrated interest in learning. Expect positive outcomes    Future DMSE  PRN    Program Status  Completed       Individualized Plan for Diabetes Self-Management Training:   Learning Objective:  Patient will have a greater understanding of diabetes  self-management. Patient education plan is to attend individual and/or group sessions per assessed needs and concerns.   Plan:   Patient Instructions  Rethink what you drink.  Drink beverages without carbohydrate.  Water  Unsweetened tea with truvia  Occasional diet soda Drink more water to stay well hydrated. Consider exercise (use your total gym) or find a youtube video for armchair exercises or walk.  Start slowly and increase slowly to goal of 30 minutes most days. 1/2 your plate non starchy vegetables  Aim for 2-3 Carb Choices per meal (30-45 grams) +/- 1 either way  Aim for 0-15 Carbs per snack if hungry  Include protein in moderation with your meals and snacks Consider reading food labels for Total Carbohydrate of foods Consider checking your blood sugar 2 times daily Continue taking medication as directed by MD      Expected Outcomes:  Demonstrated interest in learning. Expect positive outcomes  Education material provided: ADA - How to Thrive: A Guide for Your Journey with Diabetes, Food label handouts, Meal plan card, My Plate and Snack sheet, breakfast ideas  If problems or questions, patient to contact team via:  Phone  Future DSME appointment: PRN

## 2019-01-01 ENCOUNTER — Telehealth: Payer: Self-pay | Admitting: Internal Medicine

## 2019-01-01 NOTE — Telephone Encounter (Signed)
I personally have never heard of farxiga causing depression or anxiety, its action is on the kidneys, nothing else. But if she can't tolerate it, then its ok. No additional meds will be offered at this time.    Thanks

## 2019-01-01 NOTE — Telephone Encounter (Signed)
Pt stated that she looked the medication up online and that those were side effects stated, I informed pt she can stop medication and that you will not place her on anything else at this time, pt stated she understood.

## 2019-01-01 NOTE — Telephone Encounter (Signed)
Please advise 

## 2019-01-01 NOTE — Telephone Encounter (Signed)
Patient stated that she can not take the [facility] due to her have anxiety and depression. She said this medication made those much worse.  She would like to see if there is an alternative but doesn't want anything that could cause anymore anxiety or depression.    She said she does not have any money right now so unless it is free she will not be able to get this until the 22nd.

## 2019-01-09 ENCOUNTER — Other Ambulatory Visit: Payer: Self-pay | Admitting: Family Medicine

## 2019-01-26 ENCOUNTER — Encounter: Payer: BLUE CROSS/BLUE SHIELD | Admitting: Family Medicine

## 2019-01-30 ENCOUNTER — Ambulatory Visit: Payer: BLUE CROSS/BLUE SHIELD | Admitting: Internal Medicine

## 2019-02-01 ENCOUNTER — Encounter: Payer: BLUE CROSS/BLUE SHIELD | Admitting: Emergency Medicine

## 2019-02-05 ENCOUNTER — Ambulatory Visit (INDEPENDENT_AMBULATORY_CARE_PROVIDER_SITE_OTHER): Payer: BLUE CROSS/BLUE SHIELD | Admitting: Internal Medicine

## 2019-02-05 ENCOUNTER — Encounter: Payer: Self-pay | Admitting: Internal Medicine

## 2019-02-05 ENCOUNTER — Other Ambulatory Visit: Payer: Self-pay

## 2019-02-05 DIAGNOSIS — E1165 Type 2 diabetes mellitus with hyperglycemia: Secondary | ICD-10-CM | POA: Diagnosis not present

## 2019-02-05 DIAGNOSIS — E1142 Type 2 diabetes mellitus with diabetic polyneuropathy: Secondary | ICD-10-CM | POA: Diagnosis not present

## 2019-02-05 DIAGNOSIS — Z794 Long term (current) use of insulin: Secondary | ICD-10-CM | POA: Diagnosis not present

## 2019-02-05 NOTE — Progress Notes (Signed)
Virtual Visit via Video Note  I connected with Jeanette Rose on 02/05/19 at 11:10 AM EDT by a video enabled telemedicine application and verified that I am speaking with the correct person using two identifiers.   I discussed the limitations of evaluation and management by telemedicine and the availability of in person appointments. The patient expressed understanding and agreed to proceed.   -Location of the patient : Home  -Location of the provider : Office -The names of all persons participating in the telemedicine service : Pt and myself        Name: Jeanette Rose  Age/ Sex: 63 y.o., female   MRN/ DOB: [id number], [birth year]     PCP: Shawnee Knapp, MD   Reason for Endocrinology Evaluation: Type 2 Diabetes Mellitus     Initial Endocrinology Clinic Visit: 12/26/2018    PATIENT IDENTIFIER: Jeanette Rose is a 63 y.o. female with a past medical history of HTN, T2DM, OSA, Depression and Hx of CVA.   The patient has followed with Endocrinology clinic since 12/26/2018 for consultative assistance with management of her diabetes.  DIABETIC HISTORY:  Jeanette Rose was diagnosed with T2DM in 2007. She has been on Onglyza for persistent hyperglycemia, Glipizide caused anxiety, insulin therapy started 2 months after diagnosis. Her hemoglobin A1c has ranged from 7.9 % in 2015, peaking at 9.5% in 2020.   On her initial visit to our clinic her A1c was 9.5%. She was on Toujeo, Metformin ,and Trulicity .  Attempted to start her on Farxiga in 11/2018 but she wouldn't due to concerns about anxiety.   SUBJECTIVE:   During the last visit (12/26/2018): A1c 9.5%. We decreased Toujeo, increased Metformin, continued trulicity and started [place].   Today (02/05/2019): Jeanette Rose is here for a virtual 6 week follow up on her diabetes.  She checks her blood sugars 1-2 times daily, preprandial to breakfast. The patient has not had hypoglycemic episodes since the last clinic visit, lowest BG was 84 mg/dL.  Otherwise, the patient has not required any recent emergency interventions for hypoglycemia and has not had recent hospitalizations secondary to hyper or hypoglycemic episodes.  She tried the farxiga but it gave her anxiety and depression   ROS: As per HPI and as detailed below: Review of Systems  Constitutional: Negative for fever and weight loss.  Respiratory: Negative for cough and shortness of breath.   Cardiovascular: Negative for chest pain and palpitations.  Gastrointestinal: Negative for diarrhea and nausea.      HOME DIABETES REGIMEN:   Toujeo to 80 units daily  Metformin to 500 mg XR 2 tablets BID  Trulicity 1.5 mg weekly  Farxiga 5 mg daily - not taking     GLUCOSE LOG: Date  Fasting  afternoon  02/05/19 150   5/10 120   5/9  142    HISTORY:  Past Medical History:  Past Medical History:  Diagnosis Date  . Acute acalculous cholecystitis s/p lap chole 04/03/2014 04/02/2014  . Allergy   . Anemia   . Anxiety   . Asthma   . Depression   . Fatty liver    noted on CT per [facility] records  . Fibromyalgia   . GERD (gastroesophageal reflux disease)   . Hyperlipidemia associated with type 2 diabetes mellitus ([place])   . Hypertension   . Hypothyroid 2015   Transiently in 2015. Synthroid 53mg was stopped 05/07/2016 w/ tsh nml following.  . Obesity (BMI 30-39.9)   . OSA (obstructive sleep apnea)  non-compliant with CPAP  . Seborrheic dermatitis    on face and scalp, seen at [facility]  . Stroke [facility])    TIA  . Type 2 diabetes mellitus ([place])     Past Surgical History:  Past Surgical History:  Procedure Laterality Date  . BREAST REDUCTION SURGERY  06/2008  . CHOLECYSTECTOMY N/A 04/03/2014   Procedure: LAPAROSCOPIC CHOLECYSTECTOMY ;  Surgeon: Adin Hector, MD;  Location: [facility];  Service: General;  Laterality: N/A;  . REDUCTION MAMMAPLASTY    . TUBAL LIGATION       Social History:  reports that she quit smoking about 14 years ago. Her smoking use  included cigarettes. She has never used smokeless tobacco. She reports that she does not drink alcohol or use drugs. Family History:  Family History  Problem Relation Age of Onset  . Atrial fibrillation Mother   . Heart disease Father        CAD  . Diabetes Brother   . Cancer Maternal Grandmother   . Diabetes Paternal Grandmother   . Fibromyalgia Sister   . Colitis Sister       HOME MEDICATIONS: Allergies as of 02/05/2019   No Known Allergies     Medication List       Accurate as of Feb 05, 2019  9:00 AM. If you have any questions, ask your nurse or doctor.        AMANTADINE HCL PO Take 100 mg by mouth 2 (two) times daily.   Aspirin Low Dose 81 MG tablet Generic drug:  aspirin   atorvastatin 80 MG tablet Commonly known as:  LIPITOR TAKE 1 TABLET (80 MG TOTAL) BY MOUTH DAILY AT 6 PM.   cetirizine 10 MG tablet Commonly known as:  ZYRTEC Take 10 mg by mouth daily.   dapagliflozin propanediol 5 MG Tabs tablet Commonly known as:  Farxiga Take 5 mg by mouth daily.   Dulaglutide 1.5 MG/0.5ML Sopn Commonly known as:  Trulicity INJECT 1.5 MG INTO THE SKIN ONCE A WEEK.   DULoxetine 60 MG capsule Commonly known as:  CYMBALTA Take 60 mg by mouth daily.   freestyle lancets 1 Units by Does not apply route 4 (four) times daily.   linaclotide 145 MCG Caps capsule Commonly known as:  Linzess Take 1 capsule (145 mcg total) by mouth daily before breakfast. **NEED GI VISIT FOR ANY ADDITIONAL REFILLS**   LORazepam 1 MG tablet Commonly known as:  ATIVAN Take 1 mg by mouth every 8 (eight) hours as needed for anxiety.   metFORMIN 500 MG 24 hr tablet Commonly known as:  Glucophage XR Take 2 tablets (1,000 mg total) by mouth 2 (two) times daily with a meal.   metFORMIN 750 MG 24 hr tablet Commonly known as:  GLUCOPHAGE-XR TAKE 1 TABLET (750 MG TOTAL) BY MOUTH DAILY WITH SUPPER.   olmesartan 20 MG tablet Commonly known as:  BENICAR TAKE 1 TABLET BY MOUTH EVERY DAY    omeprazole 40 MG capsule Commonly known as:  PRILOSEC TAKE 1 CAPSULE (40 MG TOTAL) BY MOUTH DAILY. 30 MINUTES BEFORE A MEAL.   polyethylene glycol powder 17 GM/SCOOP powder Commonly known as:  GLYCOLAX/MIRALAX Take 15-30 g by mouth daily.   Rexulti 0.5 MG Tabs Generic drug:  Brexpiprazole Take by mouth daily.   Toujeo Max SoloStar 300 UNIT/ML Sopn Generic drug:  Insulin Glargine (2 Unit Dial) Inject 80 Units into the skin daily. Inject 124 units under the skin once daily.   Trintellix 20 MG Tabs  tablet Generic drug:  vortioxetine HBr Take 20 mg by mouth daily.   VITAMIN B-12 CR PO Take 1 tablet by mouth daily.   VITAMIN D (CHOLECALCIFEROL) PO Take 2,000 Units by mouth daily.   Vitamin D (Ergocalciferol) 1.25 MG (50000 UT) Caps capsule Commonly known as:  DRISDOL TAKE 1 CAPSULE (50,000 UNITS TOTAL) BY MOUTH EVERY 7 (SEVEN) DAYS.         DATA REVIEWED:  Lab Results  Component Value Date   HGBA1C 9.5 (A) 10/09/2018   HGBA1C 8.1 (A) 06/28/2018   HGBA1C 9.2 01/05/2018   Lab Results  Component Value Date   LDLCALC 88 10/09/2018   CREATININE 0.79 12/25/2018   No results found for: [facility]   Lab Results  Component Value Date   CHOL 174 10/09/2018   HDL 44 10/09/2018   LDLCALC 88 10/09/2018   TRIG 210 (H) 10/09/2018   CHOLHDL 4.0 10/09/2018         ASSESSMENT / PLAN / RECOMMENDATIONS:   1) Type 2 Diabetes Mellitus, poorly controlled, With neuropathic complications - Most recent A1c of 9.5 %. Goal A1c < 7.0 %.   Plan:  - Despite the scarce glucose data it seems that her BG's are doing better - She is not comfortable with starting Farxiga due to depression and anxiety, we did discuss the glycemic control benefit as well as cardiovascular benefit . - Given that she has a BG of 87 mg/dL one morning, will reduce Toujeo as below - She was encouraged to check glucose twice a day (fasting and bedtime) - Pt advised to avoid sugar-sweetened foods and to stick  with sugar-free items .   MEDICATIONS:  Decrease Toujeo to 72 units daily   Continue Metformin 500 mg XR 2 tabs BID  Continue Trulicity 1.5 mg weekly   EDUCATION / INSTRUCTIONS:  BG monitoring instructions: Patient is instructed to check her blood sugars 2 times a day, fasting and bedtime.  Call [facility] if: BG persistently < 70 or > 300. . I reviewed the Rule of 15 for the treatment of hypoglycemia in detail with the patient.    I discussed the assessment and treatment plan with the patient. The patient was provided an opportunity to ask questions and all were answered. The patient agreed with the plan and demonstrated an understanding of the instructions.   The patient was advised to call back or seek an in-person evaluation if the symptoms worsen or if the condition fails to improve as anticipated.     F/U in 3 months    Signed electronically by: Mack Guise, MD  [facility]  [facility] [address] [place],  [address] Phone: [phone] FAX: [phone]   CC: Shawnee Knapp, MD No address on file Phone: None  Fax: None  Return to Endocrinology clinic as below: Future Appointments  Date Time Provider Eunice  02/05/2019 11:10 AM Giulia Hickey, Melanie Crazier, MD [id number] None

## 2019-03-03 ENCOUNTER — Emergency Department (EMERGENCY_DEPARTMENT_HOSPITAL)
Admission: EM | Admit: 2019-03-03 | Discharge: 2019-03-04 | Disposition: A | Discharge status: Other Acute Inpatient Hospital | Payer: BLUE CROSS/BLUE SHIELD | Source: Home / Self Care | Attending: Emergency Medicine | Admitting: Emergency Medicine

## 2019-03-03 ENCOUNTER — Other Ambulatory Visit: Payer: Self-pay

## 2019-03-03 ENCOUNTER — Encounter (HOSPITAL_COMMUNITY): Payer: Self-pay | Admitting: Emergency Medicine

## 2019-03-03 DIAGNOSIS — F332 Major depressive disorder, recurrent severe without psychotic features: Secondary | ICD-10-CM | POA: Insufficient documentation

## 2019-03-03 DIAGNOSIS — Z79899 Other long term (current) drug therapy: Secondary | ICD-10-CM | POA: Insufficient documentation

## 2019-03-03 DIAGNOSIS — J45909 Unspecified asthma, uncomplicated: Secondary | ICD-10-CM | POA: Insufficient documentation

## 2019-03-03 DIAGNOSIS — R45851 Suicidal ideations: Secondary | ICD-10-CM | POA: Insufficient documentation

## 2019-03-03 DIAGNOSIS — F331 Major depressive disorder, recurrent, moderate: Secondary | ICD-10-CM | POA: Diagnosis present

## 2019-03-03 DIAGNOSIS — E039 Hypothyroidism, unspecified: Secondary | ICD-10-CM | POA: Insufficient documentation

## 2019-03-03 DIAGNOSIS — F411 Generalized anxiety disorder: Secondary | ICD-10-CM | POA: Diagnosis present

## 2019-03-03 DIAGNOSIS — Z87891 Personal history of nicotine dependence: Secondary | ICD-10-CM | POA: Insufficient documentation

## 2019-03-03 DIAGNOSIS — I10 Essential (primary) hypertension: Secondary | ICD-10-CM | POA: Insufficient documentation

## 2019-03-03 DIAGNOSIS — F1320 Sedative, hypnotic or anxiolytic dependence, uncomplicated: Secondary | ICD-10-CM | POA: Diagnosis present

## 2019-03-03 DIAGNOSIS — F339 Major depressive disorder, recurrent, unspecified: Secondary | ICD-10-CM | POA: Diagnosis present

## 2019-03-03 DIAGNOSIS — E1140 Type 2 diabetes mellitus with diabetic neuropathy, unspecified: Secondary | ICD-10-CM | POA: Insufficient documentation

## 2019-03-03 DIAGNOSIS — Z8673 Personal history of transient ischemic attack (TIA), and cerebral infarction without residual deficits: Secondary | ICD-10-CM | POA: Insufficient documentation

## 2019-03-03 DIAGNOSIS — Z794 Long term (current) use of insulin: Secondary | ICD-10-CM | POA: Insufficient documentation

## 2019-03-03 DIAGNOSIS — F13288 Sedative, hypnotic or anxiolytic dependence with other sedative, hypnotic or anxiolytic-induced disorder: Secondary | ICD-10-CM | POA: Diagnosis not present

## 2019-03-03 DIAGNOSIS — Z20828 Contact with and (suspected) exposure to other viral communicable diseases: Secondary | ICD-10-CM | POA: Insufficient documentation

## 2019-03-03 LAB — COMPREHENSIVE METABOLIC PANEL
ALT: 38 U/L (ref 0–44)
AST: 40 U/L (ref 15–41)
Albumin: 4.4 g/dL (ref 3.5–5.0)
Alkaline Phosphatase: 65 U/L (ref 38–126)
Anion gap: 8 (ref 5–15)
BUN: 7 mg/dL — ABNORMAL LOW (ref 8–23)
CO2: 24 mmol/L (ref 22–32)
Calcium: 9.8 mg/dL (ref 8.9–10.3)
Chloride: 103 mmol/L (ref 98–111)
Creatinine, Ser: 0.8 mg/dL (ref 0.44–1.00)
GFR calc Af Amer: >60 mL/min (ref >60)
GFR calc non Af Amer: >60 mL/min (ref >60)
Glucose, Bld: 119 mg/dL — ABNORMAL HIGH (ref 70–99)
Potassium: 4 mmol/L (ref 3.5–5.1)
Sodium: 135 mmol/L (ref 135–145)
Total Bilirubin: 0.8 mg/dL (ref 0.3–1.2)
Total Protein: 7.3 g/dL (ref 6.5–8.1)

## 2019-03-03 LAB — ACETAMINOPHEN LEVEL: Acetaminophen (Tylenol), Serum: <10 ug/mL — ABNORMAL LOW (ref 10–30)

## 2019-03-03 LAB — CBC
HCT: 43.4 % (ref 36.0–46.0)
Hemoglobin: 14.1 g/dL (ref 12.0–15.0)
MCH: 29.8 pg (ref 26.0–34.0)
MCHC: 32.5 g/dL (ref 30.0–36.0)
MCV: 91.8 fL (ref 80.0–100.0)
Platelets: 235 10*3/uL (ref 150–400)
RBC: 4.73 MIL/uL (ref 3.87–5.11)
RDW: 13 % (ref 11.5–15.5)
WBC: 10.6 10*3/uL — ABNORMAL HIGH (ref 4.0–10.5)
nRBC: 0 % (ref 0.0–0.2)

## 2019-03-03 LAB — RAPID URINE DRUG SCREEN, HOSP PERFORMED
Amphetamines: NOT DETECTED
Barbiturates: NOT DETECTED
Benzodiazepines: POSITIVE — AB
Cocaine: NOT DETECTED
Opiates: NOT DETECTED
Tetrahydrocannabinol: NOT DETECTED

## 2019-03-03 LAB — SALICYLATE LEVEL: Salicylate Lvl: <7 mg/dL (ref 2.8–30.0)

## 2019-03-03 LAB — ETHANOL: Alcohol, Ethyl (B): <10 mg/dL (ref <10)

## 2019-03-03 MED ORDER — LORAZEPAM 1 MG PO TABS
1.0000 mg | ORAL_TABLET | Freq: Three times a day (TID) | ORAL | Status: DC | PRN
  Administered 2019-03-03 – 2019-03-04 (×2): 1 mg via ORAL
  Filled 2019-03-03 (×2): qty 1

## 2019-03-03 MED ORDER — CANAGLIFLOZIN 100 MG PO TABS
100.0000 mg | ORAL_TABLET | Freq: Every day | ORAL | Status: DC
  Filled 2019-03-03: qty 1

## 2019-03-03 MED ORDER — METFORMIN HCL ER 500 MG PO TB24
1000.0000 mg | ORAL_TABLET | Freq: Two times a day (BID) | ORAL | Status: DC
  Administered 2019-03-04: 1000 mg via ORAL
  Filled 2019-03-03: qty 2

## 2019-03-03 MED ORDER — DULAGLUTIDE 1.5 MG/0.5ML ~~LOC~~ SOAJ: 1.5000 mg | SUBCUTANEOUS | Status: DC

## 2019-03-03 MED ORDER — VITAMIN D 25 MCG (1000 UNIT) PO TABS
2000.0000 [IU] | ORAL_TABLET | Freq: Every day | ORAL | Status: DC
  Administered 2019-03-04: 2000 [IU] via ORAL
  Filled 2019-03-03: qty 2

## 2019-03-03 MED ORDER — ASPIRIN EC 81 MG PO TBEC
81.0000 mg | DELAYED_RELEASE_TABLET | Freq: Every day | ORAL | Status: DC
  Administered 2019-03-04: 81 mg via ORAL
  Filled 2019-03-03: qty 1

## 2019-03-03 MED ORDER — DULOXETINE HCL 60 MG PO CPEP
120.0000 mg | ORAL_CAPSULE | Freq: Every morning | ORAL | Status: DC
  Administered 2019-03-04: 120 mg via ORAL
  Filled 2019-03-03: qty 2

## 2019-03-03 MED ORDER — VORTIOXETINE HBR 20 MG PO TABS
20.0000 mg | ORAL_TABLET | Freq: Every day | ORAL | Status: DC
  Filled 2019-03-03: qty 1

## 2019-03-03 MED ORDER — ATORVASTATIN CALCIUM 80 MG PO TABS: 80.0000 mg | ORAL_TABLET | Freq: Every day | ORAL | Status: DC

## 2019-03-03 MED ORDER — AMANTADINE HCL 100 MG PO CAPS
100.0000 mg | ORAL_CAPSULE | Freq: Two times a day (BID) | ORAL | Status: DC
  Administered 2019-03-04: 09:00:00 100 mg via ORAL
  Filled 2019-03-03: qty 1

## 2019-03-03 MED ORDER — LINACLOTIDE 145 MCG PO CAPS
145.0000 ug | ORAL_CAPSULE | Freq: Every day | ORAL | Status: DC
  Administered 2019-03-04: 145 ug via ORAL
  Filled 2019-03-03: qty 1

## 2019-03-03 MED ORDER — INSULIN GLARGINE 100 UNIT/ML ~~LOC~~ SOLN
72.0000 [IU] | Freq: Every day | SUBCUTANEOUS | Status: DC
  Filled 2019-03-03: qty 0.72

## 2019-03-03 MED ORDER — IRBESARTAN 150 MG PO TABS
150.0000 mg | ORAL_TABLET | Freq: Every day | ORAL | Status: DC
  Filled 2019-03-03: qty 1

## 2019-03-03 MED ORDER — LORATADINE 10 MG PO TABS
10.0000 mg | ORAL_TABLET | Freq: Every day | ORAL | Status: DC
  Filled 2019-03-03: qty 1

## 2019-03-03 NOTE — ED Notes (Addendum)
Pt belonging (2)  bags  at ped's triage nurses station.  Patient wanded by security, all clear.

## 2019-03-03 NOTE — ED Provider Notes (Signed)
[facility] EMERGENCY DEPARTMENT Provider Note   CSN: [id number] Arrival date & time: 03/03/19  1952    History   Chief Complaint Chief Complaint  Patient presents with  . Suicidal    HPI Jeanette Rose is a 63 y.o. female.     HPI Patient presents to the emergency department with suicidal ideation that started over the last few days.  The patient states she has been weaning off of Ativan as well.  Patient states she does have a history of anxiety depression.  She states she is never had suicidal ideations in the past.  The patient denies chest pain, shortness of breath, headache,blurred vision, neck pain, fever, cough, weakness, numbness, dizziness, anorexia, edema, abdominal pain, nausea, vomiting, diarrhea, rash, back pain, dysuria, hematemesis, bloody stool, near syncope, or syncope. Past Medical History:  Diagnosis Date  . Acute acalculous cholecystitis s/p lap chole 04/03/2014 04/02/2014  . Allergy   . Anemia   . Anxiety   . Asthma   . Depression   . Fatty liver    noted on CT [facility] records  . Fibromyalgia   . GERD (gastroesophageal reflux disease)   . Hyperlipidemia associated with type 2 diabetes mellitus ([place])   . Hypertension   . Hypothyroid 2015   Transiently in 2015. Synthroid 83mg was stopped 05/07/2016 w/ tsh nml following.  . Obesity (BMI 30-39.9)   . OSA (obstructive sleep apnea)    non-compliant with CPAP  . Seborrheic dermatitis    [facility]  . Stroke ([facility]    TIA  . Type 2 diabetes mellitus ([facility]     Patient Active Problem List   Diagnosis Date Noted  . Positive colorectal cancer screening using Cologuard test 02/15/2018  . Seborrheic dermatitis   . History of hypothyroidism 10/26/2017  . Elevated ferritin level 10/26/2017  . Vitamin D deficiency 10/26/2017  . Diabetic peripheral neuropathy associated with type 2 diabetes mellitus ([place] 10/26/2017  . OSA (obstructive sleep apnea)   .  Hyperlipidemia LDL goal <100   . Major depressive disorder, recurrent episode ([place]   . Constipation 04/22/2014  . Steatohepatitis 04/03/2014  . Anxiety   . Type 2 diabetes mellitus with diabetic polyneuropathy, with long-term current use of insulin ([place]   . GERD (gastroesophageal reflux disease)   . Hypertension   . Obesity (BMI 30-39.9)     Past Surgical History:  Procedure Laterality Date  . BREAST REDUCTION SURGERY  06/2008  . CHOLECYSTECTOMY N/A 04/03/2014   Procedure: LAPAROSCOPIC CHOLECYSTECTOMY ;  Surgeon: SAdin Hector MD;  Location: [facility];  Service: General;  Laterality: N/A;  . REDUCTION MAMMAPLASTY    . TUBAL LIGATION       OB History   No obstetric history on file.      Home Medications    Prior to Admission medications   Medication Sig Start Date End Date Taking? Authorizing Provider  amantadine (SYMMETREL) 100 MG capsule Take 100 mg by mouth See admin instructions. Take 100 mg by mouth in the morning and 100 mg at 5:30 PM daily   Yes [provider]  aspirin (ASPIRIN LOW DOSE) 81 MG tablet Take 81 mg by mouth daily.    Yes [provider]  atorvastatin (LIPITOR) 80 MG tablet TAKE 1 TABLET (80 MG TOTAL) BY MOUTH DAILY AT 6 PM. 12/19/18  Yes SShawnee Knapp MD  Cholecalciferol (VITAMIN D-3 PO) Take 1 capsule by mouth daily.   Yes [provider]  Cyanocobalamin (VITAMIN B-12 CR PO) Take 1 tablet by mouth daily.   Yes [provider]  Dulaglutide (TRULICITY) 1.5 QB/3.4LP SOPN INJECT 1.5 MG INTO THE SKIN ONCE A WEEK. Patient taking differently: Inject 1.5 mg into the skin every 7 (seven) days.  11/13/18  Yes Shawnee Knapp, MD  DULoxetine (CYMBALTA) 60 MG capsule Take 120 mg by mouth every morning.    Yes [provider]  Insulin Glargine, 2 Unit Dial, (TOUJEO MAX SOLOSTAR) 300 UNIT/ML SOPN Inject 72 Units into the skin daily before breakfast.    Yes [provider]  linaclotide (LINZESS) 145 MCG CAPS capsule Take 1  capsule (145 mcg total) by mouth daily before breakfast. **NEED GI VISIT FOR ANY ADDITIONAL REFILLS** 06/28/18  Yes Sagardia, Ines Bloomer, MD  LORazepam (ATIVAN) 1 MG tablet Take 1-2 mg by mouth See admin instructions. Take 1 mg by mouth at 9:00 AM, 1 mg at 1:30 PM, 1 mg at 5:30 PM, and 2 mg at 10:15 PM for 1 week (beginning on 03/01/2019.) Take 1 mg by mouth at 9:00 AM, 1 mg at 1:30 PM, 1 mg at 5:30 PM, and 1 mg at 10:15 PM (week #2.) Further titration to be determined by Dr. Toy Care. 03/01/19  Yes [provider]  metFORMIN (GLUCOPHAGE XR) 500 MG 24 hr tablet Take 2 tablets (1,000 mg total) by mouth 2 (two) times daily with a meal. 12/25/18  Yes Shamleffer, Melanie Crazier, MD  nitrofurantoin, macrocrystal-monohydrate, (MACROBID) 100 MG capsule Take 100 mg by mouth 2 (two) times a day. FOR 5 DAYS 03/03/19 03/08/19 Yes [provider]  olmesartan (BENICAR) 20 MG tablet TAKE 1 TABLET BY MOUTH EVERY DAY Patient taking differently: Take 20 mg by mouth daily.  11/28/18  Yes Rutherford Guys, MD  omeprazole (PRILOSEC) 40 MG capsule TAKE 1 CAPSULE (40 MG TOTAL) BY MOUTH DAILY. 30 MINUTES BEFORE A MEAL. Patient taking differently: Take 40 mg by mouth daily before breakfast.  12/19/18  Yes Shawnee Knapp, MD  polyethylene glycol powder (GLYCOLAX/MIRALAX) powder Take 15-30 g by mouth daily. Patient taking differently: Take 17 g by mouth daily as needed for mild constipation (when not taking Linzess).  11/03/17  Yes Shawnee Knapp, MD  Lancets (FREESTYLE) lancets 1 Units by Does not apply route 4 (four) times daily. 07/26/14   [provider]  Vitamin D, Ergocalciferol, (DRISDOL) 50000 units CAPS capsule TAKE 1 CAPSULE (50,000 UNITS TOTAL) BY MOUTH EVERY 7 (SEVEN) DAYS. Patient not taking: Reported on 03/03/2019 01/12/18   Shawnee Knapp, MD    Family History Family History  Problem Relation Age of Onset  . Atrial fibrillation Mother   . Heart disease Father        CAD  . Diabetes Brother   . Cancer  Maternal Grandmother   . Diabetes Paternal Grandmother   . Fibromyalgia Sister   . Colitis Sister     Social History Social History   Tobacco Use  . Smoking status: Former Smoker    Types: Cigarettes    Last attempt to quit: 04/22/2004    Years since quitting: 14.8  . Smokeless tobacco: Never Used  . Tobacco comment: 4 pack year hx  Substance Use Topics  . Alcohol use: No  . Drug use: No     Allergies   Farxiga [dapagliflozin]; Rexulti [brexpiprazole]; and Trintellix [vortioxetine]   Review of Systems Review of Systems  All other systems negative except as documented in the HPI. All pertinent  positives and negatives as reviewed in the HPI. Physical Exam Updated Vital Signs BP 139/81 (BP Location: Right Arm)   Pulse (!) 110   Temp 97.6 F (36.4 C) (Oral)   Resp 16   SpO2 98%   Physical Exam Vitals signs and nursing note reviewed.  Constitutional:      General: She is not in acute distress.    Appearance: She is well-developed.  HENT:     Head: Normocephalic and atraumatic.  Eyes:     Pupils: Pupils are equal, round, and reactive to light.  Neck:     Musculoskeletal: Normal range of motion and neck supple.  Cardiovascular:     Rate and Rhythm: Normal rate and regular rhythm.     Heart sounds: Normal heart sounds. No murmur. No friction rub. No gallop.   Pulmonary:     Effort: Pulmonary effort is normal. No respiratory distress.     Breath sounds: Normal breath sounds. No wheezing.  Abdominal:     General: Bowel sounds are normal. There is no distension.     Palpations: Abdomen is soft.     Tenderness: There is no abdominal tenderness.  Skin:    General: Skin is warm and dry.     Capillary Refill: Capillary refill takes less than 2 seconds.     Findings: No erythema or rash.  Neurological:     Mental Status: She is alert and oriented to person, place, and time.     Motor: No abnormal muscle tone.     Coordination: Coordination normal.  Psychiatric:         Behavior: Behavior normal.        Thought Content: Thought content is not paranoid or delusional. Thought content includes suicidal ideation. Thought content does not include homicidal ideation. Thought content includes suicidal plan. Thought content does not include homicidal plan.      ED Treatments / Results  Labs (all labs ordered are listed, but only abnormal results are displayed) Labs Reviewed  COMPREHENSIVE METABOLIC PANEL - Abnormal; Notable for the following components:      Result Value   Glucose, Bld 119 (*)    BUN 7 (*)    All other components within normal limits  ACETAMINOPHEN LEVEL - Abnormal; Notable for the following components:   Acetaminophen (Tylenol), Serum <10 (*)    All other components within normal limits  CBC - Abnormal; Notable for the following components:   WBC 10.6 (*)    All other components within normal limits  RAPID URINE DRUG SCREEN, HOSP PERFORMED - Abnormal; Notable for the following components:   Benzodiazepines POSITIVE (*)    All other components within normal limits  ETHANOL  SALICYLATE LEVEL    EKG None  Radiology No results found.  Procedures Procedures (including critical care time)  Medications Ordered in ED Medications  amantadine (SYMMETREL) capsule 100 mg (has no administration in time range)  aspirin EC tablet 81 mg (has no administration in time range)  atorvastatin (LIPITOR) tablet 80 mg (has no administration in time range)  loratadine (CLARITIN) tablet 10 mg (has no administration in time range)  canagliflozin (INVOKANA) tablet 100 mg (has no administration in time range)  Dulaglutide SOPN 1.5 mg (1.5 mg Subcutaneous Not Given 03/03/19 2255)  DULoxetine (CYMBALTA) DR capsule 120 mg (has no administration in time range)  insulin glargine (LANTUS) injection 72 Units (has no administration in time range)  linaclotide (LINZESS) capsule 145 mcg (has no administration in time range)  LORazepam (ATIVAN)  tablet 1 mg (1 mg  Oral Given 03/03/19 2253)  metFORMIN (GLUCOPHAGE-XR) 24 hr tablet 1,000 mg (has no administration in time range)  irbesartan (AVAPRO) tablet 150 mg (has no administration in time range)  vortioxetine HBr (TRINTELLIX) tablet 20 mg (has no administration in time range)  cholecalciferol (VITAMIN D3) tablet 2,000 Units (has no administration in time range)     Initial Impression / Assessment and Plan / ED Course  I have reviewed the triage vital signs and the nursing notes.  Pertinent labs & imaging results that were available during my care of the patient were reviewed by me and considered in my medical decision making (see chart for details).        Patient will need TTS assessment for suicidal ideation.  I have advised the patient of the plan and all questions were answered.  Patient has been stable here in the emergency department and she is medically clear at this time.  Final Clinical Impressions(s) / ED Diagnoses   Final diagnoses:  None    ED Discharge Orders    None       Dalia Heading, PA-C 03/03/19 2351    Little, Wenda Overland, MD 03/05/19 2111

## 2019-03-03 NOTE — ED Notes (Signed)
Pt unable to void at this time. 

## 2019-03-03 NOTE — ED Notes (Signed)
Vick Frees, [phone].

## 2019-03-03 NOTE — ED Triage Notes (Signed)
Pt tearful and anxious in triage, states that her doctor's have been trying to decrease her lorazepam dosage. Since decreasing, pt states "I have been thinking about suicide, I'm scared to do anything". Pt reports symptoms are worse in the morning.

## 2019-03-04 ENCOUNTER — Other Ambulatory Visit: Payer: Self-pay

## 2019-03-04 ENCOUNTER — Inpatient Hospital Stay (HOSPITAL_COMMUNITY)
Admission: AD | Admit: 2019-03-04 | Discharge: 2019-03-15 | DRG: 885 | Disposition: A | Discharge status: Other Acute Inpatient Hospital | Payer: BLUE CROSS/BLUE SHIELD | Source: Intra-hospital | Attending: Psychiatry | Admitting: Psychiatry

## 2019-03-04 ENCOUNTER — Encounter (HOSPITAL_COMMUNITY): Payer: Self-pay | Admitting: *Deleted

## 2019-03-04 DIAGNOSIS — N390 Urinary tract infection, site not specified: Secondary | ICD-10-CM | POA: Diagnosis present

## 2019-03-04 DIAGNOSIS — F332 Major depressive disorder, recurrent severe without psychotic features: Principal | ICD-10-CM | POA: Diagnosis present

## 2019-03-04 DIAGNOSIS — F13239 Sedative, hypnotic or anxiolytic dependence with withdrawal, unspecified: Secondary | ICD-10-CM | POA: Diagnosis present

## 2019-03-04 DIAGNOSIS — F339 Major depressive disorder, recurrent, unspecified: Secondary | ICD-10-CM | POA: Diagnosis not present

## 2019-03-04 DIAGNOSIS — G4700 Insomnia, unspecified: Secondary | ICD-10-CM | POA: Diagnosis present

## 2019-03-04 DIAGNOSIS — Z794 Long term (current) use of insulin: Secondary | ICD-10-CM

## 2019-03-04 DIAGNOSIS — E669 Obesity, unspecified: Secondary | ICD-10-CM | POA: Diagnosis present

## 2019-03-04 DIAGNOSIS — F4310 Post-traumatic stress disorder, unspecified: Secondary | ICD-10-CM | POA: Diagnosis present

## 2019-03-04 DIAGNOSIS — F411 Generalized anxiety disorder: Secondary | ICD-10-CM | POA: Diagnosis present

## 2019-03-04 DIAGNOSIS — F410 Panic disorder [episodic paroxysmal anxiety] without agoraphobia: Secondary | ICD-10-CM | POA: Diagnosis present

## 2019-03-04 DIAGNOSIS — Z87891 Personal history of nicotine dependence: Secondary | ICD-10-CM

## 2019-03-04 DIAGNOSIS — E119 Type 2 diabetes mellitus without complications: Secondary | ICD-10-CM | POA: Diagnosis present

## 2019-03-04 DIAGNOSIS — K219 Gastro-esophageal reflux disease without esophagitis: Secondary | ICD-10-CM | POA: Diagnosis present

## 2019-03-04 DIAGNOSIS — Z79899 Other long term (current) drug therapy: Secondary | ICD-10-CM

## 2019-03-04 DIAGNOSIS — K5909 Other constipation: Secondary | ICD-10-CM | POA: Diagnosis present

## 2019-03-04 DIAGNOSIS — F1320 Sedative, hypnotic or anxiolytic dependence, uncomplicated: Secondary | ICD-10-CM

## 2019-03-04 DIAGNOSIS — R45851 Suicidal ideations: Secondary | ICD-10-CM | POA: Diagnosis present

## 2019-03-04 DIAGNOSIS — Z6829 Body mass index (BMI) 29.0-29.9, adult: Secondary | ICD-10-CM | POA: Diagnosis not present

## 2019-03-04 DIAGNOSIS — Z8249 Family history of ischemic heart disease and other diseases of the circulatory system: Secondary | ICD-10-CM | POA: Diagnosis not present

## 2019-03-04 DIAGNOSIS — Z8673 Personal history of transient ischemic attack (TIA), and cerebral infarction without residual deficits: Secondary | ICD-10-CM | POA: Diagnosis not present

## 2019-03-04 DIAGNOSIS — F13288 Sedative, hypnotic or anxiolytic dependence with other sedative, hypnotic or anxiolytic-induced disorder: Secondary | ICD-10-CM

## 2019-03-04 DIAGNOSIS — G4733 Obstructive sleep apnea (adult) (pediatric): Secondary | ICD-10-CM | POA: Diagnosis present

## 2019-03-04 DIAGNOSIS — E785 Hyperlipidemia, unspecified: Secondary | ICD-10-CM | POA: Diagnosis present

## 2019-03-04 DIAGNOSIS — K760 Fatty (change of) liver, not elsewhere classified: Secondary | ICD-10-CM | POA: Diagnosis present

## 2019-03-04 DIAGNOSIS — Z7982 Long term (current) use of aspirin: Secondary | ICD-10-CM

## 2019-03-04 DIAGNOSIS — E039 Hypothyroidism, unspecified: Secondary | ICD-10-CM | POA: Diagnosis present

## 2019-03-04 DIAGNOSIS — I10 Essential (primary) hypertension: Secondary | ICD-10-CM | POA: Diagnosis present

## 2019-03-04 DIAGNOSIS — Z888 Allergy status to other drugs, medicaments and biological substances status: Secondary | ICD-10-CM

## 2019-03-04 DIAGNOSIS — Z20828 Contact with and (suspected) exposure to other viral communicable diseases: Secondary | ICD-10-CM | POA: Diagnosis present

## 2019-03-04 DIAGNOSIS — Z9119 Patient's noncompliance with other medical treatment and regimen: Secondary | ICD-10-CM

## 2019-03-04 DIAGNOSIS — Z833 Family history of diabetes mellitus: Secondary | ICD-10-CM

## 2019-03-04 DIAGNOSIS — Z9049 Acquired absence of other specified parts of digestive tract: Secondary | ICD-10-CM

## 2019-03-04 LAB — CBG MONITORING, ED: Glucose-Capillary: 79 mg/dL (ref 70–99)

## 2019-03-04 LAB — SARS CORONAVIRUS 2 BY RT PCR (HOSPITAL ORDER, PERFORMED IN ~~LOC~~ HOSPITAL LAB): SARS Coronavirus 2: NEGATIVE

## 2019-03-04 LAB — GLUCOSE, CAPILLARY
Glucose-Capillary: 156 mg/dL — ABNORMAL HIGH (ref 70–99)
Glucose-Capillary: 108 mg/dL — ABNORMAL HIGH (ref 70–99)

## 2019-03-04 MED ORDER — ENSURE ENLIVE PO LIQD
237.0000 mL | Freq: Two times a day (BID) | ORAL | Status: DC
  Administered 2019-03-04 – 2019-03-15 (×4): 237 mL via ORAL

## 2019-03-04 MED ORDER — LOPERAMIDE HCL 2 MG PO CAPS: 2.0000 mg | ORAL_CAPSULE | ORAL | Status: AC | PRN

## 2019-03-04 MED ORDER — LORAZEPAM 1 MG PO TABS: 1.0000 mg | ORAL_TABLET | Freq: Four times a day (QID) | ORAL | Status: AC | PRN

## 2019-03-04 MED ORDER — HYDROXYZINE HCL 25 MG PO TABS
25.0000 mg | ORAL_TABLET | Freq: Three times a day (TID) | ORAL | Status: DC | PRN
  Administered 2019-03-04 – 2019-03-14 (×12): 25 mg via ORAL
  Filled 2019-03-04 (×10): qty 1

## 2019-03-04 MED ORDER — METFORMIN HCL ER 500 MG PO TB24
1000.0000 mg | ORAL_TABLET | Freq: Two times a day (BID) | ORAL | Status: DC
  Administered 2019-03-04 – 2019-03-15 (×22): 1000 mg via ORAL
  Filled 2019-03-04 (×26): qty 2

## 2019-03-04 MED ORDER — TRAZODONE HCL 50 MG PO TABS
50.0000 mg | ORAL_TABLET | Freq: Every evening | ORAL | Status: DC | PRN
  Administered 2019-03-04 – 2019-03-06 (×3): 50 mg via ORAL
  Filled 2019-03-04 (×3): qty 1

## 2019-03-04 MED ORDER — LORAZEPAM 1 MG PO TABS
2.0000 mg | ORAL_TABLET | Freq: Two times a day (BID) | ORAL | Status: DC
  Administered 2019-03-04 – 2019-03-07 (×6): 2 mg via ORAL
  Filled 2019-03-04 (×6): qty 2

## 2019-03-04 MED ORDER — IRBESARTAN 150 MG PO TABS
150.0000 mg | ORAL_TABLET | Freq: Every day | ORAL | Status: DC
  Administered 2019-03-04 – 2019-03-15 (×12): 150 mg via ORAL
  Filled 2019-03-04 (×14): qty 1

## 2019-03-04 MED ORDER — ASPIRIN 81 MG PO TBEC
81.0000 mg | DELAYED_RELEASE_TABLET | Freq: Every day | ORAL | Status: DC
  Administered 2019-03-05 – 2019-03-15 (×11): 81 mg via ORAL
  Filled 2019-03-04 (×15): qty 1

## 2019-03-04 MED ORDER — LINACLOTIDE 145 MCG PO CAPS
145.0000 ug | ORAL_CAPSULE | Freq: Every day | ORAL | Status: DC
  Filled 2019-03-04 (×2): qty 1

## 2019-03-04 MED ORDER — PANTOPRAZOLE SODIUM 40 MG PO TBEC
40.0000 mg | DELAYED_RELEASE_TABLET | Freq: Every day | ORAL | Status: DC
  Administered 2019-03-05 – 2019-03-07 (×3): 40 mg via ORAL
  Filled 2019-03-04 (×5): qty 1

## 2019-03-04 MED ORDER — ALUM & MAG HYDROXIDE-SIMETH 200-200-20 MG/5ML PO SUSP: 30.0000 mL | ORAL | Status: DC | PRN

## 2019-03-04 MED ORDER — DULOXETINE HCL 60 MG PO CPEP
120.0000 mg | ORAL_CAPSULE | Freq: Every day | ORAL | Status: DC
  Administered 2019-03-05 – 2019-03-11 (×7): 120 mg via ORAL
  Filled 2019-03-04 (×10): qty 2

## 2019-03-04 MED ORDER — ONDANSETRON 4 MG PO TBDP: 4.0000 mg | ORAL_TABLET | Freq: Four times a day (QID) | ORAL | Status: AC | PRN

## 2019-03-04 MED ORDER — MIRTAZAPINE 7.5 MG PO TABS
7.5000 mg | ORAL_TABLET | Freq: Every day | ORAL | Status: DC
  Administered 2019-03-04 – 2019-03-06 (×3): 7.5 mg via ORAL
  Filled 2019-03-04 (×5): qty 1

## 2019-03-04 MED ORDER — ADULT MULTIVITAMIN W/MINERALS CH
1.0000 | ORAL_TABLET | Freq: Every day | ORAL | Status: DC
  Administered 2019-03-04 – 2019-03-15 (×12): 1 via ORAL
  Filled 2019-03-04 (×14): qty 1

## 2019-03-04 MED ORDER — HYDROXYZINE HCL 25 MG PO TABS
25.0000 mg | ORAL_TABLET | Freq: Four times a day (QID) | ORAL | Status: AC | PRN
  Filled 2019-03-04 (×2): qty 1

## 2019-03-04 MED ORDER — AMANTADINE HCL 100 MG PO CAPS
100.0000 mg | ORAL_CAPSULE | Freq: Two times a day (BID) | ORAL | Status: DC
  Administered 2019-03-04 – 2019-03-06 (×4): 100 mg via ORAL
  Filled 2019-03-04 (×6): qty 1

## 2019-03-04 MED ORDER — ACETAMINOPHEN 325 MG PO TABS
650.0000 mg | ORAL_TABLET | Freq: Four times a day (QID) | ORAL | Status: DC | PRN
  Administered 2019-03-13 – 2019-03-14 (×2): 650 mg via ORAL
  Filled 2019-03-04 (×2): qty 2

## 2019-03-04 MED ORDER — ATORVASTATIN CALCIUM 40 MG PO TABS
80.0000 mg | ORAL_TABLET | Freq: Every day | ORAL | Status: DC
  Administered 2019-03-04 – 2019-03-14 (×11): 80 mg via ORAL
  Filled 2019-03-04: qty 1
  Filled 2019-03-04: qty 2
  Filled 2019-03-04 (×2): qty 1
  Filled 2019-03-04 (×2): qty 2
  Filled 2019-03-04: qty 1
  Filled 2019-03-04: qty 2
  Filled 2019-03-04 (×2): qty 1
  Filled 2019-03-04 (×4): qty 2

## 2019-03-04 MED ORDER — PANTOPRAZOLE SODIUM 40 MG PO TBEC
40.0000 mg | DELAYED_RELEASE_TABLET | Freq: Every day | ORAL | Status: DC
  Administered 2019-03-04: 09:00:00 40 mg via ORAL
  Filled 2019-03-04: qty 1

## 2019-03-04 MED ORDER — THIAMINE HCL 100 MG/ML IJ SOLN
100.0000 mg | Freq: Once | INTRAMUSCULAR | Status: AC
  Administered 2019-03-04: 100 mg via INTRAMUSCULAR
  Filled 2019-03-04: qty 2

## 2019-03-04 MED ORDER — MAGNESIUM HYDROXIDE 400 MG/5ML PO SUSP
30.0000 mL | Freq: Every day | ORAL | Status: DC | PRN
  Administered 2019-03-05: 30 mL via ORAL
  Filled 2019-03-04: qty 30

## 2019-03-04 MED ORDER — OXCARBAZEPINE 150 MG PO TABS
75.0000 mg | ORAL_TABLET | Freq: Two times a day (BID) | ORAL | Status: DC
  Administered 2019-03-04 – 2019-03-05 (×2): 75 mg via ORAL
  Filled 2019-03-04 (×2): qty 0.5
  Filled 2019-03-04: qty 1
  Filled 2019-03-04 (×2): qty 0.5

## 2019-03-04 MED ORDER — LORAZEPAM 1 MG PO TABS: 1.0000 mg | ORAL_TABLET | Freq: Four times a day (QID) | ORAL | Status: DC

## 2019-03-04 MED ORDER — VITAMIN B-1 100 MG PO TABS
100.0000 mg | ORAL_TABLET | Freq: Every day | ORAL | Status: DC
  Administered 2019-03-05 – 2019-03-15 (×11): 100 mg via ORAL
  Filled 2019-03-04 (×13): qty 1

## 2019-03-04 NOTE — Progress Notes (Signed)
Adult Psychoeducational Group Note  Date:  03/04/2019 Time:  9:28 PM  Group Topic/Focus:  Wrap-Up Group:   The focus of this group is to help patients review their daily goal of treatment and discuss progress on daily workbooks.  Participation Level:  Active  Participation Quality:  Appropriate  Affect:  Anxious  Cognitive:  Appropriate  Insight: Appropriate  Engagement in Group:  Engaged  Modes of Intervention:  Discussion  Additional Comments:  Patient attended group and participated.  Kanyla Omeara W Mindy Gali 06/01/7075, 9:28 PM

## 2019-03-04 NOTE — ED Notes (Signed)
Breakfast delivered.

## 2019-03-04 NOTE — Progress Notes (Signed)
NUTRITION ASSESSMENT RD working remotely.   Pt identified as at risk on the Malnutrition Screen Tool  INTERVENTION: - will order Ensure Enlive BID, each supplement provides 350 kcal and 20 grams of protein. - will order daily multivitamin with minerals. - continue to encourage PO intakes.    NUTRITION DIAGNOSIS: Unintentional weight loss related to sub-optimal intake as evidenced by pt report.   Goal: Pt to meet >/= 90% of their estimated nutrition needs.  Monitor:  PO intake  Assessment:  Patient with history of PTSD, generalized anxiety disorder, major depression, and benzo dependence. Patient admitted d/t SI. She reported feelings of hopelessness, helplessness, and worthlessness.   Per chart review, current weight is 169 lb and weight on 4/2 was 190 lb. This indicates 21 lb weight loss (11% body weight) in the past 2 months which is significant for time frame.     63 y.o. female  Height: Ht Readings from Last 1 Encounters:  03/04/19 5' 4"  (1.626 m)    Weight: Wt Readings from Last 1 Encounters:  03/04/19 76.7 kg    Weight Hx: Wt Readings from Last 10 Encounters:  03/04/19 76.7 kg  12/28/18 86.2 kg  12/25/18 87.1 kg  10/09/18 84.9 kg  06/28/18 83 kg  01/05/18 82.4 kg  11/24/17 81.4 kg  11/03/17 81.6 kg  10/13/17 82 kg  07/03/16 84.8 kg    BMI:  Body mass index is 29.01 kg/m. Pt meets criteria for overweight based on current BMI.  Estimated Nutritional Needs: Kcal: 25-30 kcal/kg Protein: > 1 gram protein/kg Fluid: 1 ml/kcal  Diet Order:  Diet Order            Diet regular Room service appropriate? Yes; Fluid consistency: Thin  Diet effective now             Pt is also offered choice of unit snacks mid-morning and mid-afternoon.  Pt is eating as desired.   Lab results and medications reviewed.     Jarome Matin, MS, RD, LDN, [facility] Inpatient Clinical Dietitian Pager # [phone] After hours/weekend pager # [phone]

## 2019-03-04 NOTE — Plan of Care (Signed)
Nurse discussed anxiety, depression, coping skills with patient. 

## 2019-03-04 NOTE — ED Notes (Signed)
Pt at desk using phone

## 2019-03-04 NOTE — ED Notes (Signed)
ALL belongings - 1 labeled belongings bag - [place]. Pt aware.

## 2019-03-04 NOTE — Progress Notes (Addendum)
Patient is 63 yr old female, first admission to [facility], voluntary.  Patient stated she lives in a trailer filled with junk.  Her son died in that trailer 5 yrs ago, and she has not thrown any of her son's clothes, belongings away.  Patient was prescribed ativan by Dr. Toy Care and has been taking ativan 1 mg seven times daily.  Stated her daughter and her grandson does not visit her often.  They live 25 minutes away.  Stated her daughter's husband does not like her.  Stated she had gallbladder surgery in 2015.  Has difficulty standing, walking because of neuropathy.  Patient is divorced.  Worked at [employer] 25 yrs, and then worked at [employer].  Patient did have SI thoughts this week, afraid she won't be the same person, afraid to take a shower, afraid to write her bills.  Last dental visit 2016.  Has lost 20 lb in past month.  Denied physical, verbal, sexual abuse.  Completed GED.  Has her car to get to medical appointments.   Patient denied tobacco, alcohol and all drug abuse.  Denied SI and HI during admission, contracts for safety.  Denied A/V hallucinations.  Rated depression, anxiety, hopeless #10.     Bruises on lower legs/knees.  Fall risk information given and discussed with patient who stated she understood and had no questions. Patient oriented to unit, given food/drink.  Locker 38 has one small fan, one large black purse, wallet, cards, $50.00 cash, one pair brown sandals.   Marland Kitchen

## 2019-03-04 NOTE — ED Notes (Signed)
Patient denies pain and is resting comfortably.  

## 2019-03-04 NOTE — Progress Notes (Signed)
Per Joselyn Arrow, pt has been accepted to [facility] bed 400-2. Accepting provider is Marvia Pickles, NP. Attending provider is Dr. Mallie Darting, MD. Patient can arrive after getting results for COVID testing. Number for report is [phone]. AC spoke with Jacqlyn Larsen, RN regarding disposition.  Chalmers Guest. Guerry Bruin, MSW, [place] Work/Disposition Phone: [phone] Fax: [phone]

## 2019-03-04 NOTE — BH Assessment (Signed)
Tele Assessment Note   Patient Name: Jeanette Rose MRN: [id number] Referring Physician: Irena Cords, PA Location of Patient: [facility] Location of Provider: [facility] Department  Jeanette Rose is an 63 y.o. female.  -Clinician reviewed note by Irena Cords, PA.  Patient presents to the emergency department with suicidal ideation that started over the last few days.  The patient states she has been weaning off of Ativan as well.  Patient states she does have a history of anxiety depression.  She states she is never had suicidal ideations in the past.  Patient said that her sister brought her to the hospital.  She had told sister that for the last week she has been thinking of killing herself.  She says "I have been thinking of the right combinations of medications to overdose on."    Patient says that her psychiatrist is reducing her lorazepam.  This started on Thursday (06/04) and she is supposed to be taking 4 tablets per day now.  Patient said she has only had one today.  Patient says she has not had any previous suicide attempts.  She is tearful during assessment.  She says she feels overwhelmed and scared.  Her son died of a opioid overdose in 2009-12-04.  She has not moved his things out of her trailer.  She says her house is in [place].  She is afraid her landlord will see the mess and evict her.  Patient has a daughter but they don't talk much.  She became tearful talking about not seeing her grandchildren much either.  Patient denies any HI or A/V hallucinations.  Patient says that she has not slept well, getting up and down at night.  She has lost 20 lbs in the last month.  Patient is tearful throughout assessment.  Patient is followed by Dr. Chucky May.  She has been at [facility] in 09/2003.    -Clinician discussed patient care with Lorre Munroe, PA.  Clinician let him know that patient would need to have an AM psych eval.    Diagnosis: F33.2 MDD recurrent, severe;   Past Medical  History:  Past Medical History:  Diagnosis Date  . Acute acalculous cholecystitis s/p lap chole 04/03/2014 04/02/2014  . Allergy   . Anemia   . Anxiety   . Asthma   . Depression   . Fatty liver    noted on CT per [facility] records  . Fibromyalgia   . GERD (gastroesophageal reflux disease)   . Hyperlipidemia associated with type 2 diabetes mellitus ([place])   . Hypertension   . Hypothyroid 2013/12/04   Transiently in 2013/12/04. Synthroid 66mg was stopped 05/07/2016 w/ tsh nml following.  . Obesity (BMI 30-39.9)   . OSA (obstructive sleep apnea)    non-compliant with CPAP  . Seborrheic dermatitis    [facility]  . [facility] ([facility]    [facility]IA  . [place] 2 diabetes mellitus [place]     Past Surgical History:  Procedure Laterality Date  . BREAST REDUCTION SURGERY  06/2008  . CHOLECYSTECTOMY N/A 04/03/2014   Procedure: LAPAROSCOPIC CHOLECYSTECTOMY ;  Surgeon: SAdin Hector MD;  Location: [facility] ORS;  Service: General;  Laterality: N/A;  . REDUCTION MAMMAPLASTY    . TUBAL LIGATION      Family History:  Family History  Problem Relation Age of Onset  . Atrial fibrillation Mother   . Heart disease Father        CAD  . Diabetes Brother   .  Cancer Maternal Grandmother   . Diabetes Paternal Grandmother   . Fibromyalgia Sister   . Colitis Sister     Social History:  reports that she quit smoking about 14 years ago. Her smoking use included cigarettes. She has never used smokeless tobacco. She reports that she does not drink alcohol or use drugs.  Additional Social History:  Alcohol / Drug Use Pain Medications: None Prescriptions: Lorazepam 44m 4x/D (started on 03/01/19 at this titration from 5 per day).  See PTA med list for other medications. Over the Counter: Vitamins History of alcohol / drug use?: No history of alcohol / drug abuse  CIWA: CIWA-Ar BP: 139/81 Pulse Rate: (!) 110 COWS:    Allergies:  Allergies  Allergen Reactions  . Farxiga [Dapagliflozin] Anxiety  and Other (See Comments)    Made depression and anxiety worse  . Rexulti [Brexpiprazole] Anxiety and Other (See Comments)    Made anxiety worse  . Trintellix [Vortioxetine] Anxiety and Other (See Comments)    Made anxiety worse    Home Medications: (Not in a hospital admission)   OB/GYN Rose:  No LMP recorded. Patient is postmenopausal.  General Assessment Data [facility] Assessment: In system Is this a Tele or Face-to-Face Assessment?: Tele Assessment Is this an Initial Assessment or a Re-assessment for this encounter?: Initial Assessment Patient Accompanied by:: N/A Language Other than English: No Living Arrangements: Other (Comment)(Lives by herself.) What gender do you identify as?: Female Marital Rose: Divorced MElwin Rose: Jeanette Rose: No Living Arrangements: Alone Can pt return to current living arrangement?: Yes Admission Rose: Voluntary Is patient capable of signing voluntary admission?: Yes Referral Source: Self/Family/Friend([facility] Insurance type: BC/BS     Crisis Care Plan Living Arrangements: Alone Name of Psychiatrist: Dr. RChucky MayName of Therapist: None  Education Rose Is patient currently in school?: No Is the patient employed, unemployed or receiving disability?: Receiving disability income  Risk to self with the past 6 months Suicidal Ideation: Yes-Currently Present Has patient been a risk to self within the past 6 months prior to admission? : No Suicidal Intent: Yes-Currently Present Has patient had any suicidal intent within the past 6 months prior to admission? : No Is patient at risk for suicide?: Yes Suicidal Plan?: Yes-Currently Present Has patient had any suicidal plan within the past 6 months prior to admission? : No Specify Current Suicidal Plan: Find the right combination of meds to overdose on. Access to Means: Yes Specify Access to Suicidal Means:  Medications What has been your use of drugs/alcohol within the last 12 months?: None Previous Attempts/Gestures: No How many times?: 0 Other Self Harm Risks: None Triggers for Past Attempts: None known Intentional Self Injurious Behavior: None Family Suicide History: No Recent stressful life event(s): Other (Comment)(May have to move.  Son died in [id number]) Persecutory voices/beliefs?: Yes Depression: Yes Depression Symptoms: Despondent, Tearfulness, Isolating, Guilt, Loss of interest in usual pleasures, Feeling worthless/self pity, Insomnia Substance abuse history and/or treatment for substance abuse?: No Suicide prevention information given to non-admitted patients: Not applicable  Risk to Others within the past 6 months Homicidal Ideation: No Does patient have any lifetime risk of violence toward others beyond the six months prior to admission? : No Thoughts of Harm to Others: No Current Homicidal Intent: No Current Homicidal Plan: No Access to Homicidal Means: No Identified Victim: No one History of harm to others?: No Assessment of Violence: None Noted Violent Behavior Description: None Does patient have  access to weapons?: No Criminal Charges Pending?: No Does patient have a court date: No Is patient on probation?: No  Psychosis Hallucinations: None noted Delusions: None noted  Mental Rose Report Appearance/Hygiene: Unremarkable, In scrubs Eye Contact: Good Motor Activity: Freedom of movement, Unremarkable Speech: Logical/coherent Level of Consciousness: Alert, Crying Mood: Anxious, Depressed, Sad, Helpless Affect: Anxious, Depressed, Sad Anxiety Level: Severe Thought Processes: Coherent, Relevant Judgement: Impaired Orientation: Person, Place, Situation, Time Obsessive Compulsive Thoughts/Behaviors: None  Cognitive Functioning Concentration: Decreased Memory: Recent Impaired, Remote Intact Is patient IDD: No Insight: Fair Impulse Control: Fair Appetite:  Poor Have you had any weight changes? : Loss Amount of the weight change? (lbs): (20lbs in a month) Sleep: Decreased Total Hours of Sleep: (<5 hours.  Up and down at night) Vegetative Symptoms: Staying in bed, Decreased grooming  ADLScreening [facility] Assessment Services) Patient's cognitive ability adequate to safely complete daily activities?: Yes Patient able to express need for assistance with ADLs?: Yes Independently performs ADLs?: Yes (appropriate for developmental age)  Prior Inpatient Therapy Prior Inpatient Therapy: Yes Prior Therapy Dates: 09/2003 Prior Therapy Facilty/Provider(s): [facility] Reason for Treatment: Anxiety  Prior Outpatient Therapy Prior Outpatient Therapy: Yes Prior Therapy Dates: For years Prior Therapy Facilty/Provider(s): Dr. Chucky May Reason for Treatment: med mangement Does patient have an ACCT team?: No Does patient have Intensive In-House Services?  : No Does patient have [facility] services? : No Does patient have P4CC services?: No  ADL Screening (condition at time of admission) Patient's cognitive ability adequate to safely complete daily activities?: Yes Is the patient deaf or have difficulty hearing?: No Does the patient have difficulty seeing, even when wearing glasses/contacts?: No Does the patient have difficulty concentrating, remembering, or making decisions?: Yes Patient able to express need for assistance with ADLs?: Yes Does the patient have difficulty dressing or bathing?: No Independently performs ADLs?: Yes (appropriate for developmental age) Does the patient have difficulty walking or climbing stairs?: Yes(Morton's neuroma and neuropathy) Weakness of Legs: Both Weakness of Arms/Hands: Both(Hands will go numb when she is asleep.)       Abuse/Neglect Assessment (Assessment to be complete while patient is alone) Abuse/Neglect Assessment Can Be Completed: Yes Physical Abuse: Denies Verbal Abuse: Denies Sexual Abuse:  Denies Exploitation of patient/patient's resources: Denies Self-Neglect: Denies     Regulatory affairs officer (For Healthcare) Does Patient Have a Medical Advance Directive?: No Would patient like information on creating a medical advance directive?: No - Patient declined          Disposition:  Disposition Initial Assessment Completed for this Encounter: Yes Patient referred to: Other (Comment)(To be reviewed w/ provider)  This service was provided via telemedicine using a 2-way, interactive audio and video technology.  Names of all persons participating in this telemedicine service and their role in this encounter. Name: Eliora Nienhuis Role: patient  Name: Curlene Dolphin, M.S. LCAS QP Role: clinician  Name:  Role:   Name:  Role:     Raymondo Band 03/04/2019 12:20 AM

## 2019-03-04 NOTE — ED Notes (Signed)
Pt noted to crying since Telepsych completed. Pt asking for "medicine" x 2.

## 2019-03-04 NOTE — ED Notes (Addendum)
Lunch tray ordered.  Provider at bedside

## 2019-03-04 NOTE — ED Notes (Signed)
Breakfast tray ordered 

## 2019-03-04 NOTE — H&P (Signed)
Psychiatric Admission Assessment Adult  Patient Identification: Jeanette Rose MRN:  [id number] Date of Evaluation:  03/04/2019 Chief Complaint:  MDD Principal Diagnosis: <principal problem not specified> Diagnosis:  Active Problems:   MDD (major depressive disorder), recurrent episode, severe ([place])  History of Present Illness: Patient is seen and examined.  Patient is a 63 year old female with a past psychiatric history significant for posttraumatic stress disorder, generalized anxiety disorder, major depression and benzodiazepine dependence who presented to the [facility] urgency department on 03/04/2019 with suicidal ideation.  The patient is followed by Dr. Toy Care.  Over the last several months they have been attempting to get her depression under control as well as her anxiety, but have not had a great deal of success.  Recently Dr. Toy Care increased her lorazepam dosage from 2 mg twice a day to a total of 5 to 6 mg a day.  The patient stated that she has had a terrible time with anxiety since her son died several years ago.  She stated that she found her son dead, and turned him over and saw that his face was blue.  She is not gotten over that.  She is currently on disability for posttraumatic stress disorder.  Since the coronavirus issue started she has been sequestered pretty much in her home, and none of her sons possessions have been removed from the home since his death.  It is a reminder every day of what occurred.  She admitted that she had been overusing the benzodiazepines, and that now she was becoming agitated with them and it was not working with her anxiety.  She has been on multiple antidepressants in the past.  She is on 120 mg of Cymbalta once a day and they have attempted to augment that with Abilify and other medications without success.  Approximately a month ago Dr. Toy Care recommended consideration of ECT.  She is frightened by that.  She also stated another additional psychosocial  stressor is that her relationship with her daughter is not very good, and she does not feel supported by her.  She admitted to helplessness, hopelessness and worthlessness.  She was crying and tearful throughout the interview.  She was admitted to the hospital for evaluation and stabilization.  Associated Signs/Symptoms: Depression Symptoms:  depressed mood, anhedonia, insomnia, psychomotor agitation, fatigue, feelings of worthlessness/guilt, difficulty concentrating, hopelessness, suicidal thoughts without plan, anxiety, panic attacks, loss of energy/fatigue, (Hypo) Manic Symptoms:  Impulsivity, Irritable Mood, Labiality of Mood, Anxiety Symptoms:  Excessive Worry, Psychotic Symptoms:  denied PTSD Symptoms: Had a traumatic exposure:  Death of her son Total Time spent with patient: 30 minutes  Past Psychiatric History: Patient denied any previous psychiatric admissions.  She has had depression for many years.  She has had posttraumatic stress disorder for many years.  She is followed by Dr. Toy Care locally.  She has been on multiple medications without great success recently.  Dr. Toy Care has suggested possible ECT.  Is the patient at risk to self? Yes.    Has the patient been a risk to self in the past 6 months? Yes.    Has the patient been a risk to self within the distant past? No.  Is the patient a risk to others? No.  Has the patient been a risk to others in the past 6 months? No.  Has the patient been a risk to others within the distant past? No.   Prior Inpatient Therapy:   Prior Outpatient Therapy:    Alcohol Screening:  Substance Abuse History in the last 12 months:  No. Consequences of Substance Abuse: Negative Previous Psychotropic Medications: Yes  Psychological Evaluations: Yes  Past Medical History:  Past Medical History:  Diagnosis Date  . Acute acalculous cholecystitis s/p lap chole 04/03/2014 04/02/2014  . Allergy   . Anemia   . Anxiety   . Asthma   .  Depression   . Fatty liver    noted on CT per [facility] records  . Fibromyalgia   . GERD (gastroesophageal reflux disease)   . Hyperlipidemia associated with type 2 diabetes mellitus ([place])   . Hypertension   . Hypothyroid 2015   Transiently in 2015. Synthroid 41mg was stopped 05/07/2016 w/ tsh nml following.  . Obesity (BMI 30-39.9)   . OSA (obstructive sleep apnea)    non-compliant with CPAP  . Seborrheic dermatitis    [facility]  . Stroke ([facility]    TIA  . Type 2 diabetes mellitus ([place]     Past Surgical History:  Procedure Laterality Date  . BREAST REDUCTION SURGERY  06/2008  . CHOLECYSTECTOMY N/A 04/03/2014   Procedure: LAPAROSCOPIC CHOLECYSTECTOMY ;  Surgeon: SAdin Hector MD;  Location: [facility] ORS;  Service: General;  Laterality: N/A;  . REDUCTION MAMMAPLASTY    . TUBAL LIGATION     Family History:  Family History  Problem Relation Age of Onset  . Atrial fibrillation Mother   . Heart disease Father        CAD  . Diabetes Brother   . Cancer Maternal Grandmother   . Diabetes Paternal Grandmother   . Fibromyalgia Sister   . Colitis Sister    Family Psychiatric  History: Noncontributory Tobacco Screening:   Social History:  Social History   Substance and Sexual Activity  Alcohol Use No     Social History   Substance and Sexual Activity  Drug Use No    Additional Social History:                           Allergies:   Allergies  Allergen Reactions  . Farxiga [Dapagliflozin] Anxiety and Other (See Comments)    Made depression and anxiety worse  . Rexulti [Brexpiprazole] Anxiety and Other (See Comments)    Made anxiety worse  . Trintellix [Vortioxetine] Anxiety and Other (See Comments)    Made anxiety worse   Lab Results:  Results for orders placed or performed during the hospital encounter of 03/03/19 (from the past 48 hour(s))  Comprehensive metabolic panel     Status: Abnormal   Collection Time: 03/03/19   8:12 PM  Result Value Ref Range   Sodium 135 135 - 145 mmol/L   Potassium 4.0 3.5 - 5.1 mmol/L   Chloride 103 98 - 111 mmol/L   CO2 24 22 - 32 mmol/L   Glucose, Bld 119 (H) 70 - 99 mg/dL   BUN 7 (L) 8 - 23 mg/dL   Creatinine, Ser 0.80 0.44 - 1.00 mg/dL   Calcium 9.8 8.9 - 10.3 mg/dL   Total Protein 7.3 6.5 - 8.1 g/dL   Albumin 4.4 3.5 - 5.0 g/dL   AST 40 15 - 41 U/L   ALT 38 0 - 44 U/L   Alkaline Phosphatase 65 38 - 126 U/L   Total Bilirubin 0.8 0.3 - 1.2 mg/dL   GFR calc non Af Amer >60 >60 mL/min   GFR calc Af Amer >60 >60 mL/min  Anion gap 8 5 - 15    Comment: Performed at [facility] [address] [place], [place] [address]  Ethanol     Status: None   Collection Time: 03/03/19  8:12 PM  Result Value Ref Range   Alcohol, Ethyl (B) <10 <10 mg/dL    Comment: (NOTE) Lowest detectable limit for serum alcohol is 10 mg/dL. For medical purposes only. Performed at [facility], [place] [address] [place], [place] [address]   Salicylate level     Status: None   Collection Time: 03/03/19  8:12 PM  Result Value Ref Range   Salicylate Lvl <1.6 2.8 - 30.0 mg/dL    Comment: Performed at [facility] [address], [place], [place] [address]  Acetaminophen level     Status: Abnormal   Collection Time: 03/03/19  8:12 PM  Result Value Ref Range   Acetaminophen (Tylenol), Serum <10 (L) 10 - 30 ug/mL    Comment: (NOTE) Therapeutic concentrations vary significantly. A range of 10-30 ug/mL  may be an effective concentration for many patients. However, some  are best treated at concentrations outside of this range. Acetaminophen concentrations >150 ug/mL at 4 hours after ingestion  and >50 ug/mL at 12 hours after ingestion are often associated with  toxic reactions. Performed at [facility], [place] [address], [place], [place] [address]   cbc     Status: Abnormal   Collection Time: 03/03/19  8:12 PM  Result Value Ref Range   WBC 10.6 (H) 4.0 - 10.5 K/uL    RBC 4.73 3.87 - 5.11 MIL/uL   Hemoglobin 14.1 12.0 - 15.0 g/dL   HCT 43.4 36.0 - 46.0 %   MCV 91.8 80.0 - 100.0 fL   MCH 29.8 26.0 - 34.0 pg   MCHC 32.5 30.0 - 36.0 g/dL   RDW 13.0 11.5 - 15.5 %   Platelets 235 150 - 400 K/uL   nRBC 0.0 0.0 - 0.2 %    Comment: Performed at [facility], [place] [address] [place], [place] [address]  Rapid urine drug screen (hospital performed)     Status: Abnormal   Collection Time: 03/03/19  9:32 PM  Result Value Ref Range   Opiates NONE DETECTED NONE DETECTED   Cocaine NONE DETECTED NONE DETECTED   Benzodiazepines POSITIVE (A) NONE DETECTED   Amphetamines NONE DETECTED NONE DETECTED   Tetrahydrocannabinol NONE DETECTED NONE DETECTED   Barbiturates NONE DETECTED NONE DETECTED    Comment: (NOTE) DRUG SCREEN FOR MEDICAL PURPOSES ONLY.  IF CONFIRMATION IS NEEDED FOR ANY PURPOSE, NOTIFY LAB WITHIN 5 DAYS. LOWEST DETECTABLE LIMITS FOR URINE DRUG SCREEN Drug Class                     Cutoff (ng/mL) Amphetamine and metabolites    1000 Barbiturate and metabolites    200 Benzodiazepine                 270 Tricyclics and metabolites     300 Opiates and metabolites        300 Cocaine and metabolites        300 THC                            50 Performed at [facility], [place] [address] [place], [place] [address]   CBG monitoring, ED     Status: None   Collection  Time: 03/04/19  8:36 AM  Result Value Ref Range   Glucose-Capillary 79 70 - 99 mg/dL   Comment 1 Notify RN    Comment 2 Document in Chart   SARS Coronavirus 2 (CEPHEID - Performed in [facility] hospital lab), Hosp Order     Status: None   Collection Time: 03/04/19  9:20 AM  Result Value Ref Range   SARS Coronavirus 2 NEGATIVE NEGATIVE    Comment: (NOTE) If result is NEGATIVE SARS-CoV-2 target nucleic acids are NOT DETECTED. The SARS-CoV-2 RNA is generally detectable in upper and lower  respiratory specimens during the acute phase of infection. The lowest  concentration of  SARS-CoV-2 viral copies this assay can detect is 250  copies / mL. A negative result does not preclude SARS-CoV-2 infection  and should not be used as the sole basis for treatment or other  patient management decisions.  A negative result may occur with  improper specimen collection / handling, submission of specimen other  than nasopharyngeal swab, presence of viral mutation(s) within the  areas targeted by this assay, and inadequate number of viral copies  (<250 copies / mL). A negative result must be combined with clinical  observations, patient history, and epidemiological information. If result is POSITIVE SARS-CoV-2 target nucleic acids are DETECTED. The SARS-CoV-2 RNA is generally detectable in upper and lower  respiratory specimens dur ing the acute phase of infection.  Positive  results are indicative of active infection with SARS-CoV-2.  Clinical  correlation with patient history and other diagnostic information is  necessary to determine patient infection status.  Positive results do  not rule out bacterial infection or co-infection with other viruses. If result is PRESUMPTIVE POSTIVE SARS-CoV-2 nucleic acids MAY BE PRESENT.   A presumptive positive result was obtained on the submitted specimen  and confirmed on repeat testing.  While 2019 novel coronavirus  (SARS-CoV-2) nucleic acids may be present in the submitted sample  additional confirmatory testing may be necessary for epidemiological  and / or clinical management purposes  to differentiate between  SARS-CoV-2 and other Sarbecovirus currently known to infect humans.  If clinically indicated additional testing with an alternate test  methodology [id number]) is advised. The SARS-CoV-2 RNA is generally  detectable in upper and lower respiratory sp ecimens during the acute  phase of infection. The expected result is Negative. Fact Sheet for Patients:  StrictlyIdeas.no Fact Sheet for Healthcare  Providers: BankingDealers.co.za This test is not yet approved or cleared by the [place] FDA and has been authorized for detection and/or diagnosis of SARS-CoV-2 by FDA under an Emergency Use Authorization (EUA).  This EUA will remain in effect (meaning this test can be used) for the duration of the COVID-19 declaration under Section 564(b)(1) of the Act, 21 U.S.C. section 360bbb-3(b)(1), unless the authorization is terminated or revoked sooner. Performed at [facility], [place] [address], [place], [place] [address]     Blood Alcohol level:  Lab Results  Component Value Date   ETH <10 03/00/9233    Metabolic Disorder Labs:  Lab Results  Component Value Date   HGBA1C 9.5 (A) 10/09/2018   MPG 180 (H) 04/03/2014   No results found for: PROLACTIN Lab Results  Component Value Date   CHOL 174 10/09/2018   TRIG 210 (H) 10/09/2018   HDL 44 10/09/2018   CHOLHDL 4.0 10/09/2018   LDLCALC 88 10/09/2018   LDLCALC 77 06/28/2018    Current Medications: Current Facility-Administered Medications  Medication Dose Route Frequency Provider  Last Rate Last Dose  . acetaminophen (TYLENOL) tablet 650 mg  650 mg Oral Q6H PRN Sharma Covert, MD      . alum & mag hydroxide-simeth (MAALOX/MYLANTA) 200-200-20 MG/5ML suspension 30 mL  30 mL Oral Q4H PRN Sharma Covert, MD      . amantadine (SYMMETREL) capsule 100 mg  100 mg Oral BID Sharma Covert, MD      . aspirin EC tablet 81 mg  81 mg Oral Daily Sharma Covert, MD      . atorvastatin (LIPITOR) tablet 80 mg  80 mg Oral q1800 Sharma Covert, MD      . DULoxetine (CYMBALTA) DR capsule 120 mg  120 mg Oral Daily Sharma Covert, MD      . hydrOXYzine (ATARAX/VISTARIL) tablet 25 mg  25 mg Oral Q6H PRN Sharma Covert, MD      . hydrOXYzine (ATARAX/VISTARIL) tablet 25 mg  25 mg Oral TID PRN Sharma Covert, MD      . irbesartan (AVAPRO) tablet 150 mg  150 mg Oral Daily Sharma Covert, MD       . Derrill Memo ON 03/05/2019] linaclotide (LINZESS) capsule 145 mcg  145 mcg Oral QAC breakfast Sharma Covert, MD      . loperamide (IMODIUM) capsule 2-4 mg  2-4 mg Oral PRN Sharma Covert, MD      . LORazepam (ATIVAN) tablet 1 mg  1 mg Oral Q6H PRN Sharma Covert, MD      . LORazepam (ATIVAN) tablet 2 mg  2 mg Oral BID Sharma Covert, MD      . magnesium hydroxide (MILK OF MAGNESIA) suspension 30 mL  30 mL Oral Daily PRN Sharma Covert, MD      . metFORMIN (GLUCOPHAGE-XR) 24 hr tablet 1,000 mg  1,000 mg Oral BID WC Sharma Covert, MD      . mirtazapine (REMERON) tablet 7.5 mg  7.5 mg Oral QHS Sharma Covert, MD      . multivitamin with minerals tablet 1 tablet  1 tablet Oral Daily Sharma Covert, MD      . ondansetron (ZOFRAN-ODT) disintegrating tablet 4 mg  4 mg Oral Q6H PRN Sharma Covert, MD      . OXcarbazepine (TRILEPTAL) tablet 75 mg  75 mg Oral BID Sharma Covert, MD      . pantoprazole (PROTONIX) EC tablet 40 mg  40 mg Oral Daily Sharma Covert, MD      . thiamine (B-1) injection 100 mg  100 mg Intramuscular Once Sharma Covert, MD      . Derrill Memo ON 03/05/2019] thiamine (VITAMIN B-1) tablet 100 mg  100 mg Oral Daily Sharma Covert, MD      . traZODone (DESYREL) tablet 50 mg  50 mg Oral QHS PRN Sharma Covert, MD       PTA Medications: Medications Prior to Admission  Medication Sig Dispense Refill Last Dose  . amantadine (SYMMETREL) 100 MG capsule Take 100 mg by mouth See admin instructions. Take 100 mg by mouth in the morning and 100 mg at 5:30 PM daily   03/03/2019 at 1730  . aspirin (ASPIRIN LOW DOSE) 81 MG tablet Take 81 mg by mouth daily.    03/03/2019 at 0900  . atorvastatin (LIPITOR) 80 MG tablet TAKE 1 TABLET (80 MG TOTAL) BY MOUTH DAILY AT 6 PM. 90 tablet 0 03/03/2019 at 1800  . Cholecalciferol (VITAMIN D-3 PO) Take 1  capsule by mouth daily.   03/02/2019 at Unknown time  . Cyanocobalamin (VITAMIN B-12 CR PO) Take 1 tablet by mouth  daily.   03/03/2019 at Unknown time  . Dulaglutide (TRULICITY) 1.5 WC/5.8NI SOPN INJECT 1.5 MG INTO THE SKIN ONCE A WEEK. (Patient taking differently: Inject 1.5 mg into the skin every 7 (seven) days. ) 4 pen 4 03/02/2019 at Unknown time  . DULoxetine (CYMBALTA) 60 MG capsule Take 120 mg by mouth every morning.    03/03/2019 at am  . Insulin Glargine, 2 Unit Dial, (TOUJEO MAX SOLOSTAR) 300 UNIT/ML SOPN Inject 72 Units into the skin daily before breakfast.    03/03/2019 at am  . Lancets (FREESTYLE) lancets 1 Units by Does not apply route 4 (four) times daily.   Taking  . linaclotide (LINZESS) 145 MCG CAPS capsule Take 1 capsule (145 mcg total) by mouth daily before breakfast. **NEED GI VISIT FOR ANY ADDITIONAL REFILLS** 30 capsule 0 03/03/2019 at Unknown time  . LORazepam (ATIVAN) 1 MG tablet Take 1-2 mg by mouth See admin instructions. Take 1 mg by mouth at 9:00 AM, 1 mg at 1:30 PM, 1 mg at 5:30 PM, and 2 mg at 10:15 PM for 1 week (beginning on 03/01/2019.) Take 1 mg by mouth at 9:00 AM, 1 mg at 1:30 PM, 1 mg at 5:30 PM, and 1 mg at 10:15 PM (week #2.) Further titration to be determined by Dr. Toy Care.   03/03/2019 at 1730  . metFORMIN (GLUCOPHAGE XR) 500 MG 24 hr tablet Take 2 tablets (1,000 mg total) by mouth 2 (two) times daily with a meal. 120 tablet 11 03/03/2019 at pm  . nitrofurantoin, macrocrystal-monohydrate, (MACROBID) 100 MG capsule Take 100 mg by mouth 2 (two) times a day. FOR 5 DAYS   03/03/2019 at pm  . olmesartan (BENICAR) 20 MG tablet TAKE 1 TABLET BY MOUTH EVERY DAY (Patient taking differently: Take 20 mg by mouth daily. ) 90 tablet 0 03/03/2019 at am  . omeprazole (PRILOSEC) 40 MG capsule TAKE 1 CAPSULE (40 MG TOTAL) BY MOUTH DAILY. 30 MINUTES BEFORE A MEAL. (Patient taking differently: Take 40 mg by mouth daily before breakfast. ) 90 capsule 0 03/03/2019 at am  . polyethylene glycol powder (GLYCOLAX/MIRALAX) powder Take 15-30 g by mouth daily. (Patient taking differently: Take 17 g by mouth daily as needed  for mild constipation (when not taking Linzess). ) 500 g 5 unk at unk  . Vitamin D, Ergocalciferol, (DRISDOL) 50000 units CAPS capsule TAKE 1 CAPSULE (50,000 UNITS TOTAL) BY MOUTH EVERY 7 (SEVEN) DAYS. (Patient not taking: Reported on 03/03/2019) 24 capsule 0 Not Taking at Unknown time    Musculoskeletal: Strength & Muscle Tone: within normal limits Gait & Station: normal Patient leans: N/A  Psychiatric Specialty Exam: Physical Exam  Nursing note and vitals reviewed. Constitutional: She is oriented to person, place, and time. She appears well-developed and well-nourished.  HENT:  Head: Normocephalic and atraumatic.  Respiratory: Effort normal.  Neurological: She is alert and oriented to person, place, and time.    ROS  There were no vitals taken for this visit.There is no height or weight on file to calculate BMI.  General Appearance: Casual  Eye Contact:  Minimal  Speech:  Pressured  Volume:  Increased  Mood:  Anxious and Depressed  Affect:  Labile and Tearful  Thought Process:  Coherent and Descriptions of Associations: Intact  Orientation:  Full (Time, Place, and Person)  Thought Content:  Logical  Suicidal Thoughts:  Yes.  without intent/plan  Homicidal Thoughts:  No  Memory:  Immediate;   Fair Recent;   Fair Remote;   Fair  Judgement:  Intact  Insight:  Fair  Psychomotor Activity:  Increased  Concentration:  Concentration: Fair and Attention Span: Fair  Recall:  [employer] of Knowledge:  Fair  Language:  Fair  Akathisia:  Negative  Handed:  Right  AIMS (if indicated):     Assets:  Desire for Improvement Resilience  ADL's:  Intact  Cognition:  WNL  Sleep:       Treatment Plan Summary: Daily contact with patient to assess and evaluate symptoms and progress in treatment, Medication management and Plan : Patient is seen and examined.  Patient is a 63 year old female with the above-stated past psychiatric history who was admitted secondary to suicidal ideation as  well as benzodiazepine dependence.  She will be admitted to the hospital.  She will be integrated into the milieu.  She will be encouraged to attend groups.  She will be weaned off the lorazepam using the CIWA scale.  She will initially start on 2 mg twice a day, and she will have 1 mg p.o. every 6 hours PRN a CIWA greater than 10.  Her Cymbalta 120 mg p.o. daily as well as her Symmetrel 100 mg p.o. twice daily will be continued for now.  I will add Trileptal 75 mg p.o. twice daily to augment her antidepressant treatment as well as stabilize her mood and relieve some of her anxiety.  This will be titrated during the course the hospitalization.  Although she does have diabetes and the possibility of weight gain and increased appetite might occur with mirtazapine, given the failures of other medications I will start that tonight at 7.5 mg p.o. nightly.  With regard to her medical conditions, her Lipitor work, Avapro, Linzess, Imodium, metformin, multivitamin and Zofran will be continued.  Review of her laboratories revealed a mildly elevated glucose, a mildly elevated white blood cell count, and a drug screen positive only for benzodiazepines.  Her blood pressure is mildly elevated today at 148/72, but I think that may be due to her anxiety and agitation.  Observation Level/Precautions:  Detox 15 minute checks  Laboratory:  Chemistry Profile  Psychotherapy:    Medications:    Consultations:    Discharge Concerns:    Estimated LOS:  Other:     Physician Treatment Plan for Primary Diagnosis: <principal problem not specified> Long Term Goal(s): Improvement in symptoms so as ready for discharge  Short Term Goals: Ability to identify changes in lifestyle to reduce recurrence of condition will improve, Ability to verbalize feelings will improve, Ability to disclose and discuss suicidal ideas, Ability to demonstrate self-control will improve, Ability to identify and develop effective coping behaviors will  improve and Ability to maintain clinical measurements within normal limits will improve  Physician Treatment Plan for Secondary Diagnosis: Active Problems:   MDD (major depressive disorder), recurrent episode, severe ([place])  Long Term Goal(s): Improvement in symptoms so as ready for discharge  Short Term Goals: Ability to identify changes in lifestyle to reduce recurrence of condition will improve, Ability to verbalize feelings will improve, Ability to disclose and discuss suicidal ideas, Ability to demonstrate self-control will improve, Ability to identify and develop effective coping behaviors will improve and Ability to maintain clinical measurements within normal limits will improve  I certify that inpatient services furnished can reasonably be expected to improve the patient's condition.    Sharma Covert, MD  6/7/20201:51 PM

## 2019-03-04 NOTE — Tx Team (Signed)
Initial Treatment Plan 03/04/2019 3:56 PM Aadya Kindler [facility]:[id number]    PATIENT STRESSORS: Financial difficulties Marital or family conflict Medication change or noncompliance Occupational concerns Substance abuse Traumatic event   PATIENT STRENGTHS: Ability for insight Average or above average intelligence Capable of independent living Communication skills General fund of knowledge Motivation for treatment/growth Supportive family/friends   PATIENT IDENTIFIED PROBLEMS: "anxiety"  "depression"  "panic attack"  "suicidal thoughts"               DISCHARGE CRITERIA:  Ability to meet basic life and health needs Adequate post-discharge living arrangements Improved stabilization in mood, thinking, and/or behavior Medical problems require only outpatient monitoring Reduction of life-threatening or endangering symptoms to within safe limits Safe-care adequate arrangements made Verbal commitment to aftercare and medication compliance Withdrawal symptoms are absent or subacute and managed without 24-hour nursing intervention  PRELIMINARY DISCHARGE PLAN: Attend aftercare/continuing care group Attend PHP/IOP Outpatient therapy Participate in family therapy Placement in alternative living arrangements Return to previous living arrangement  PATIENT/FAMILY INVOLVEMENT: This treatment plan has been presented to and reviewed with the patient, Carely Nappier..  The patient and family have been given the opportunity to ask questions and make suggestions.  Grayland Ormond Gantt, RN 03/04/2019, 3:56 PM

## 2019-03-04 NOTE — ED Provider Notes (Signed)
Psychiatric Default Provider Note:   Case discussed with pharmacist, Hildred Alamin.  According patient's medication reconciliation review records, she is supposed to be on 72 units of Toujeo daily and 1.5 mg weekly of Trulicity.  Next dose of Trulicity is [id number].  Tujeo not on formulary but can be converted to Lantus. Toujeo is a one-to-one translation to Lantus, however in discussion with pharmacist given patient stable and borderline hypoglycemic readings, 72 units might be too high right now.  Patient is also endorsing that she is not eating as much as she typically does.  Per pharmacy recommendations, we will hold Lantus here in the hospital.  If patient experiences hyperglycemia, pharmacy recommends resuming Lantus at half dose (36 units) as necessary.  If patient has stable blood sugars in the hospital, would recommend discontinuing the Toujeo as an outpatient until patient can see her primary care provider.  This was discussed with the patient.  She typically takes her blood sugars in the morning to obtain a fasting reading but does not check ACHS.   10:31 AM: Spoke with Marvia Pickles, NP who is aware of all medication changes to patient's antihyperglycemics. He is aware of plan. Appreciate his involvement.   10:43 AM Pt ready for transfer to [facility].  Evaluated and in no acute distress.  Patient ambulatory with normal and symmetric gait.   Albesa Seen, PA-C 03/04/19 1044    Dorie Rank, MD 03/06/19 1529

## 2019-03-04 NOTE — ED Notes (Signed)
Pt voiced understanding and agreement w/tx plan - accepted to [facility] 400-2. Pt signed consent form - Copy faxed to [facility] - Copy sent to Medical Records - Original placed in envelope for [facility].

## 2019-03-04 NOTE — BH Assessment (Signed)
[facility] Assessment Progress Note   Clinician spoke with Dr. Mallie [facility] psychiatry) and he recommended AM psych eval.  He said if she was recommended for inpatient then we should consider her.  AC Mechele Claude said that we have availability.

## 2019-03-04 NOTE — Consult Note (Signed)
Telepsych Consultation   Reason for Consult:  Suicidal ideations Referring Physician:  EDP Location of Patient:  Location of Provider: [place] Department  Patient Identification: Jeanette Rose MRN:  [id number] Principal Diagnosis: Major depressive disorder, recurrent episode ([place]) Diagnosis:  Principal Problem:   Major depressive disorder, recurrent episode ([place]) Active Problems:   GAD (generalized anxiety disorder)   Benzodiazepine dependence, continuous ([place])   Total Time spent with patient: 30 minutes  Subjective:   Jeanette Rose is a 63 y.o. female patient reports that she came to the hospital because her depression and anxiety have been getting worse.  She reports that she has been taking 1 mg Ativan 6 times a day and this is been prescribed by Dr. Toy Care.  She reports that she has been having some adverse reactions to taking so much Ativan every day and her dose has been decreased to 5 times a day.  She states that another issue that she has had is that her son died 9 years ago when she is not paying from the house that belonged to him.  She reports that due to COVID-19 she has not been leaving the house and none of her family have been visiting her either.  She reports that the combination of looking at his stuff every day and not dealing with his death and not having any support recently has made things a lot worse.  She states that the thought of decreasing her Ativan is also concerned her.  Patient states that she does not feel that she can be safe if she goes home because she will have to go back to the same environment with no additional help.  She states that she does not see a therapist.  Patient denies any homicidal ideations and denies any hallucinations.  HPI:  63 y.o. female. -Clinician reviewed note by Irena Cords, PA.  Patient presents to the emergency department with suicidal ideation that started over the last few days. The patient states she has been weaning  off of Ativan as well. Patient states she does have a history of anxiety depression. She states she is never had suicidal ideations in the past. Patient said that her sister brought her to the hospital.  She had told sister that for the last week she has been thinking of killing herself.  She says "I have been thinking of the right combinations of medications to overdose on."   Patient says that her psychiatrist is reducing her lorazepam.  This started on Thursday (06/04) and she is supposed to be taking 4 tablets per day now.  Patient said she has only had one today. Patient says she has not had any previous suicide attempts.  She is tearful during assessment.  She says she feels overwhelmed and scared.  Her son died of a opioid overdose in Nov 23, 2009.  She has not moved his things out of her trailer.  She says her house is in [place].  She is afraid her landlord will see the mess and evict her.  Patient has a daughter but they don't talk much.  She became tearful talking about not seeing her grandchildren much either. Patient denies any HI or A/V hallucinations. Patient says that she has not slept well, getting up and down at night.  She has lost 20 lbs in the last month.  Patient is tearful throughout assessment. Patient is followed by Dr. Chucky May.  She has been at [facility] in 09/2003.    Past Psychiatric History: MDD, GAD,  panic disorder, 1 previous hospitalization in 2005  Risk to Self: Suicidal Ideation: Yes-Currently Present Suicidal Intent: Yes-Currently Present Is patient at risk for suicide?: Yes Suicidal Plan?: Yes-Currently Present Specify Current Suicidal Plan: Find the right combination of meds to overdose on. Access to Means: Yes Specify Access to Suicidal Means: Medications What has been your use of drugs/alcohol within the last 12 months?: None How many times?: 0 Other Self Harm Risks: None Triggers for Past Attempts: None known Intentional Self Injurious Behavior: None Risk to  Others: Homicidal Ideation: No Thoughts of Harm to Others: No Current Homicidal Intent: No Current Homicidal Plan: No Access to Homicidal Means: No Identified Victim: No one History of harm to others?: No Assessment of Violence: None Noted Violent Behavior Description: None Does patient have access to weapons?: No Criminal Charges Pending?: No Does patient have a court date: No Prior Inpatient Therapy: Prior Inpatient Therapy: Yes Prior Therapy Dates: 09/2003 Prior Therapy Facilty/Provider(s): [facility] Reason for Treatment: Anxiety Prior Outpatient Therapy: Prior Outpatient Therapy: Yes Prior Therapy Dates: For years Prior Therapy Facilty/Provider(s): Dr. Chucky May Reason for Treatment: med mangement Does patient have an ACCT team?: No Does patient have Intensive In-House Services?  : No Does patient have [facility] services? : No Does patient have P4CC services?: No  Past Medical History:  Past Medical History:  Diagnosis Date  . Acute acalculous cholecystitis s/p lap chole 04/03/2014 04/02/2014  . Allergy   . Anemia   . Anxiety   . Asthma   . Depression   . Fatty liver    noted on CT per [facility] records  . Fibromyalgia   . GERD (gastroesophageal reflux disease)   . Hyperlipidemia associated with type 2 diabetes mellitus ([place])   . Hypertension   . Hypothyroid 2015   Transiently in 2015. Synthroid 3mg was stopped 05/07/2016 w/ tsh nml following.  . Obesity (BMI 30-39.9)   . OSA (obstructive sleep apnea)    non-compliant with CPAP  . Seborrheic dermatitis    [facility] face and [facility]  . Stroke ([facility]    TIA  . Type 2 diabetes mellitus ([place]     Past Surgical History:  Procedure Laterality Date  . BREAST REDUCTION SURGERY  06/2008  . CHOLECYSTECTOMY N/A 04/03/2014   Procedure: LAPAROSCOPIC CHOLECYSTECTOMY ;  Surgeon: SAdin Hector MD;  Location: [facility] ORS;  Service: General;  Laterality: N/A;  . REDUCTION MAMMAPLASTY    . TUBAL LIGATION     Family  History:  Family History  Problem Relation Age of Onset  . Atrial fibrillation Mother   . Heart disease Father        CAD  . Diabetes Brother   . Cancer Maternal Grandmother   . Diabetes Paternal Grandmother   . Fibromyalgia Sister   . Colitis Sister    Family Psychiatric  History: Denies Social History:  Social History   Substance and Sexual Activity  Alcohol Use No     Social History   Substance and Sexual Activity  Drug Use No    Social History   Socioeconomic History  . Marital status: Divorced    Spouse name: Not on file  . Number of children: Not on file  . Years of education: Not on file  . Highest education level: Not on file  Occupational History  . Not on file  Social Needs  . Financial resource strain: Not on file  . Food insecurity:    Worry: Not on file  Inability: Not on file  . Transportation needs:    Medical: Not on file    Non-medical: Not on file  Tobacco Use  . Smoking status: Former Smoker    Types: Cigarettes    Last attempt to quit: 04/22/2004    Years since quitting: 14.8  . Smokeless tobacco: Never Used  . Tobacco comment: 4 pack year hx  Substance and Sexual Activity  . Alcohol use: No  . Drug use: No  . Sexual activity: Never  Lifestyle  . Physical activity:    Days per week: Not on file    Minutes per session: Not on file  . Stress: Not on file  Relationships  . Social connections:    Talks on phone: Not on file    Gets together: Not on file    Attends religious service: Not on file    Active member of club or organization: Not on file    Attends meetings of clubs or organizations: Not on file    Relationship status: Not on file  Other Topics Concern  . Not on file  Social History Narrative  . Not on file   Additional Social History:    Allergies:   Allergies  Allergen Reactions  . Farxiga [Dapagliflozin] Anxiety and Other (See Comments)    Made depression and anxiety worse  . Rexulti [Brexpiprazole] Anxiety  and Other (See Comments)    Made anxiety worse  . Trintellix [Vortioxetine] Anxiety and Other (See Comments)    Made anxiety worse    Labs:  Results for orders placed or performed during the hospital encounter of 03/03/19 (from the past 48 hour(s))  Comprehensive metabolic panel     Status: Abnormal   Collection Time: 03/03/19  8:12 PM  Result Value Ref Range   Sodium 135 135 - 145 mmol/L   Potassium 4.0 3.5 - 5.1 mmol/L   Chloride 103 98 - 111 mmol/L   CO2 24 22 - 32 mmol/L   Glucose, Bld 119 (H) 70 - 99 mg/dL   BUN 7 (L) 8 - 23 mg/dL   Creatinine, Ser 0.80 0.44 - 1.00 mg/dL   Calcium 9.8 8.9 - 10.3 mg/dL   Total Protein 7.3 6.5 - 8.1 g/dL   Albumin 4.4 3.5 - 5.0 g/dL   AST 40 15 - 41 U/L   ALT 38 0 - 44 U/L   Alkaline Phosphatase 65 38 - 126 U/L   Total Bilirubin 0.8 0.3 - 1.2 mg/dL   GFR calc non Af Amer >60 >60 mL/min   GFR calc Af Amer >60 >60 mL/min   Anion gap 8 5 - 15    Comment: Performed at [facility], [place] [address], [place], [place] [address]  Ethanol     Status: None   Collection Time: 03/03/19  8:12 PM  Result Value Ref Range   Alcohol, Ethyl (B) <10 <10 mg/dL    Comment: (NOTE) Lowest detectable limit for serum alcohol is 10 mg/dL. For medical purposes only. Performed at [facility], [place] [address], [place], [place] [address]   Salicylate level     Status: None   Collection Time: 03/03/19  8:12 PM  Result Value Ref Range   Salicylate Lvl <1.5 2.8 - 30.0 mg/dL    Comment: Performed at [place] [address], [place], [place] [address]  Acetaminophen level     Status: Abnormal   Collection Time: 03/03/19  8:12 PM  Result Value Ref Range  Acetaminophen (Tylenol), Serum <10 (L) 10 - 30 ug/mL    Comment: (NOTE) Therapeutic concentrations vary significantly. A range of 10-30 ug/mL  may be an effective concentration for many patients. However, some  are best treated at concentrations outside of this range. Acetaminophen  concentrations >150 ug/mL at 4 hours after ingestion  and >50 ug/mL at 12 hours after ingestion are often associated with  toxic reactions. Performed at [facility], [place] [address], [place], [place] [address]   cbc     Status: Abnormal   Collection Time: 03/03/19  8:12 PM  Result Value Ref Range   WBC 10.6 (H) 4.0 - 10.5 K/uL   RBC 4.73 3.87 - 5.11 MIL/uL   Hemoglobin 14.1 12.0 - 15.0 g/dL   HCT 43.4 36.0 - 46.0 %   MCV 91.8 80.0 - 100.0 fL   MCH 29.8 26.0 - 34.0 pg   MCHC 32.5 30.0 - 36.0 g/dL   RDW 13.0 11.5 - 15.5 %   Platelets 235 150 - 400 K/uL   nRBC 0.0 0.0 - 0.2 %    Comment: Performed at [facility], [place] [address], [place], [place] [address]  Rapid urine drug screen (hospital performed)     Status: Abnormal   Collection Time: 03/03/19  9:32 PM  Result Value Ref Range   Opiates NONE DETECTED NONE DETECTED   Cocaine NONE DETECTED NONE DETECTED   Benzodiazepines POSITIVE (A) NONE DETECTED   Amphetamines NONE DETECTED NONE DETECTED   Tetrahydrocannabinol NONE DETECTED NONE DETECTED   Barbiturates NONE DETECTED NONE DETECTED    Comment: (NOTE) DRUG SCREEN FOR MEDICAL PURPOSES ONLY.  IF CONFIRMATION IS NEEDED FOR ANY PURPOSE, NOTIFY LAB WITHIN 5 DAYS. LOWEST DETECTABLE LIMITS FOR URINE DRUG SCREEN Drug Class                     Cutoff (ng/mL) Amphetamine and metabolites    1000 Barbiturate and metabolites    200 Benzodiazepine                 094 Tricyclics and metabolites     300 Opiates and metabolites        300 Cocaine and metabolites        300 THC                            50 Performed at [facility], [place] [address], [place], [place] [address]   CBG monitoring, ED     Status: None   Collection Time: 03/04/19  8:36 AM  Result Value Ref Range   Glucose-Capillary 79 70 - 99 mg/dL   Comment 1 Notify RN    Comment 2 Document in Chart     Medications:  Current Facility-Administered Medications  Medication Dose Route Frequency Provider  Last Rate Last Dose  . amantadine (SYMMETREL) capsule 100 mg  100 mg Oral BID WC Lawyer, [place], PA-C      . aspirin EC tablet 81 mg  81 mg Oral Daily Lawyer, [place], PA-C      . atorvastatin (LIPITOR) tablet 80 mg  80 mg Oral q1800 Lawyer, [place], PA-C      . canagliflozin [facility]) tablet 100 mg  100 mg Oral QAC breakfast Lawyer, [place], PA-C      . cholecalciferol (VITAMIN D3) tablet 2,000 Units  2,000 Units Oral Daily Lawyer, [place], PA-C      . Dulaglutide SOPN 1.5 mg  1.5 mg Subcutaneous Q7 days Dalia Heading, PA-C      . DULoxetine (CYMBALTA) DR capsule 120 mg  120 mg Oral q morning - 10a Lawyer, [place], PA-C      . insulin glargine (LANTUS) injection 72 Units  72 Units Subcutaneous QAC breakfast Lawyer, [place], PA-C      . irbesartan (AVAPRO) tablet 150 mg  150 mg Oral Daily Lawyer, [place], PA-C      . linaclotide (LINZESS) capsule 145 mcg  145 mcg Oral QAC breakfast Lawyer, [place], PA-C      . loratadine (CLARITIN) tablet 10 mg  10 mg Oral Daily Lawyer, [place], PA-C      . LORazepam (ATIVAN) tablet 1 mg  1 mg Oral Q8H PRN Dalia Heading, PA-C   1 mg at 03/03/19 2253  . metFORMIN (GLUCOPHAGE-XR) 24 hr tablet 1,000 mg  1,000 mg Oral BID WC Lawyer, [place], PA-C      . pantoprazole (PROTONIX) EC tablet 40 mg  40 mg Oral Daily Dorie Rank, MD      . vortioxetine HBr (TRINTELLIX) tablet 20 mg  20 mg Oral Daily Lawyer, [place], PA-C       Current Outpatient Medications  Medication Sig Dispense Refill  . amantadine (SYMMETREL) 100 MG capsule Take 100 mg by mouth See admin instructions. Take 100 mg by mouth in the morning and 100 mg at 5:30 PM daily    . aspirin (ASPIRIN LOW DOSE) 81 MG tablet Take 81 mg by mouth daily.     Marland Kitchen atorvastatin (LIPITOR) 80 MG tablet TAKE 1 TABLET (80 MG TOTAL) BY MOUTH DAILY AT 6 PM. 90 tablet 0  . Cholecalciferol (VITAMIN D-3 PO) Take 1 capsule by mouth daily.    . Cyanocobalamin (VITAMIN B-12  CR PO) Take 1 tablet by mouth daily.    . Dulaglutide (TRULICITY) 1.5 EY/8.1KG SOPN INJECT 1.5 MG INTO THE SKIN ONCE A WEEK. (Patient taking differently: Inject 1.5 mg into the skin every 7 (seven) days. ) 4 pen 4  . DULoxetine (CYMBALTA) 60 MG capsule Take 120 mg by mouth every morning.     . Insulin Glargine, 2 Unit Dial, (TOUJEO MAX SOLOSTAR) 300 UNIT/ML SOPN Inject 72 Units into the skin daily before breakfast.     . linaclotide (LINZESS) 145 MCG CAPS capsule Take 1 capsule (145 mcg total) by mouth daily before breakfast. **NEED GI VISIT FOR ANY ADDITIONAL REFILLS** 30 capsule 0  . LORazepam (ATIVAN) 1 MG tablet Take 1-2 mg by mouth See admin instructions. Take 1 mg by mouth at 9:00 AM, 1 mg at 1:30 PM, 1 mg at 5:30 PM, and 2 mg at 10:15 PM for 1 week (beginning on 03/01/2019.) Take 1 mg by mouth at 9:00 AM, 1 mg at 1:30 PM, 1 mg at 5:30 PM, and 1 mg at 10:15 PM (week #2.) Further titration to be determined by Dr. Toy Care.    . metFORMIN (GLUCOPHAGE XR) 500 MG 24 hr tablet Take 2 tablets (1,000 mg total) by mouth 2 (two) times daily with a meal. 120 tablet 11  . nitrofurantoin, macrocrystal-monohydrate, (MACROBID) 100 MG capsule Take 100 mg by mouth 2 (two) times a day. FOR 5 DAYS    . olmesartan (BENICAR) 20 MG tablet TAKE 1 TABLET BY MOUTH EVERY DAY (Patient taking differently: Take 20 mg by mouth daily. ) 90 tablet 0  . omeprazole (PRILOSEC) 40 MG capsule TAKE 1 CAPSULE (40 MG TOTAL) BY MOUTH DAILY. 30 MINUTES BEFORE A MEAL. (Patient taking differently: Take 40  mg by mouth daily before breakfast. ) 90 capsule 0  . polyethylene glycol powder (GLYCOLAX/MIRALAX) powder Take 15-30 g by mouth daily. (Patient taking differently: Take 17 g by mouth daily as needed for mild constipation (when not taking Linzess). ) 500 g 5  . Lancets (FREESTYLE) lancets 1 Units by Does not apply route 4 (four) times daily.    . Vitamin D, Ergocalciferol, (DRISDOL) 50000 units CAPS capsule TAKE 1 CAPSULE (50,000 UNITS TOTAL)  BY MOUTH EVERY 7 (SEVEN) DAYS. (Patient not taking: Reported on 03/03/2019) 24 capsule 0    Musculoskeletal: Strength & Muscle Tone: within normal limits Gait & Station: Patient remained in bed during evaluation Patient leans: N/A  Psychiatric Specialty Exam: Physical Exam  Nursing note and vitals reviewed. Constitutional: She is oriented to person, place, and time. She appears well-developed and well-nourished.  Cardiovascular: Normal rate.  Respiratory: Effort normal.  Musculoskeletal: Normal range of motion.  Neurological: She is alert and oriented to person, place, and time.  Skin: Skin is warm.    Review of Systems  Constitutional: Negative.   HENT: Negative.   Eyes: Negative.   Respiratory: Negative.   Cardiovascular: Negative.   Gastrointestinal: Negative.   Genitourinary: Negative.   Musculoskeletal: Negative.   Skin: Negative.   Neurological: Negative.   Endo/Heme/Allergies: Negative.   Psychiatric/Behavioral: Positive for depression and suicidal ideas. The patient is nervous/anxious.        Benzodiazepine dependence    Blood pressure 125/70, pulse 94, temperature 98.1 F (36.7 C), temperature source Oral, resp. rate 16, SpO2 99 %.There is no height or weight on file to calculate BMI.  General Appearance: Disheveled  Eye Contact:  Good  Speech:  Clear and Coherent and Normal Rate  Volume:  Decreased  Mood:  Depressed  Affect:  Depressed and Tearful  Thought Process:  Coherent and Descriptions of Associations: Intact  Orientation:  Full (Time, Place, and Person)  Thought Content:  WDL  Suicidal Thoughts:  Yes.  without intent/plan  Homicidal Thoughts:  No  Memory:  Immediate;   Good Recent;   Good Remote;   Good  Judgement:  Fair  Insight:  Fair  Psychomotor Activity:  Normal  Concentration:  Concentration: Fair and Attention Span: Fair  Recall:  Good  Fund of Knowledge:  Good  Language:  Good  Akathisia:  No  Handed:  Right  AIMS (if indicated):      Assets:  Communication Skills Desire for Improvement Financial Resources/Insurance Housing Resilience Transportation  ADL's:  Intact  Cognition:  WNL  Sleep:        Treatment Plan Summary: Recommend inpatient treatment Decrease Ativan use Assist with management of depression  Disposition: Recommend psychiatric Inpatient admission when medically cleared.  This service was provided via telemedicine using a 2-way, interactive audio and video technology.  Names of all persons participating in this telemedicine service and their role in this encounter. Name: Jeanette Rose Role: Patient  Name: Darnelle Maffucci Brand Siever nP Role: provider  Name:  Role:   Name:  Role:     Lewis Shock, FNP 03/04/2019 8:48 AM

## 2019-03-04 NOTE — [facility] Suicide Risk Assessment (Signed)
[facility] Admission Suicide Risk Assessment   Nursing information obtained from:    Demographic factors:    Current Mental Status:    Loss Factors:    Historical Factors:    Risk Reduction Factors:     Total Time spent with patient: 30 minutes Principal Problem: <principal problem not specified> Diagnosis:  Active Problems:   MDD (major depressive disorder), recurrent episode, severe (HCC)  Subjective Data: Patient is seen and examined.  Patient is a 63 year old female with a past psychiatric history significant for posttraumatic stress disorder, generalized anxiety disorder, major depression and benzodiazepine dependence who presented to the [facility] urgency department on 03/04/2019 with suicidal ideation.  The patient is followed by Dr. Toy Care.  Over the last several months they have been attempting to get her depression under control as well as her anxiety, but have not had a great deal of success.  Recently Dr. Toy Care increased her lorazepam dosage from 2 mg twice a day to a total of 5 to 6 mg a day.  The patient stated that she has had a terrible time with anxiety since her son died several years ago.  She stated that she found her son dead, and turned him over and saw that his face was blue.  She is not gotten over that.  She is currently on disability for posttraumatic stress disorder.  Since the coronavirus issue started she has been sequestered pretty much in her home, and none of her sons possessions have been removed from the home since his death.  It is a reminder every day of what occurred.  She admitted that she had been overusing the benzodiazepines, and that now she was becoming agitated with them and it was not working with her anxiety.  She has been on multiple antidepressants in the past.  She is on 120 mg of Cymbalta once a day and they have attempted to augment that with Abilify and other medications without success.  Approximately a month ago Dr. Robina Ade recommended consideration of  ECT.  She is frightened by that.  She also stated another additional psychosocial stressor is that her relationship with her daughter is not very good, and she does not feel supported by her.  She admitted to helplessness, hopelessness and worthlessness.  She was crying and tearful throughout the interview.  She was admitted to the hospital for evaluation and stabilization.  Continued Clinical Symptoms:    The "Alcohol Use Disorders Identification Test", Guidelines for Use in Primary Care, Second Edition.  World Pharmacologist [facility]). Score between 0-7:  no or low risk or alcohol related problems. Score between 8-15:  moderate risk of alcohol related problems. Score between 16-19:  high risk of alcohol related problems. Score 20 or above:  warrants further diagnostic evaluation for alcohol dependence and treatment.   CLINICAL FACTORS:   Severe Anxiety and/or Agitation Panic Attacks Depression:   Anhedonia Hopelessness Impulsivity Insomnia Chronic Pain More than one psychiatric diagnosis Previous Psychiatric Diagnoses and Treatments   Musculoskeletal: Strength & Muscle Tone: within normal limits Gait & Station: normal Patient leans: N/A  Psychiatric Specialty Exam: Physical Exam  Nursing note and vitals reviewed. Constitutional: She is oriented to person, place, and time. She appears well-developed and well-nourished.  HENT:  Head: Normocephalic and atraumatic.  Respiratory: Effort normal.  Neurological: She is alert and oriented to person, place, and time.    ROS  There were no vitals taken for this visit.There is no height or weight on file to calculate  BMI.  General Appearance: Casual  Eye Contact:  Fair  Speech:  Pressured  Volume:  Increased  Mood:  Anxious and Depressed  Affect:  Labile  Thought Process:  Coherent and Descriptions of Associations: Intact  Orientation:  Full (Time, Place, and Person)  Thought Content:  Logical  Suicidal Thoughts:  Yes.  without  intent/plan  Homicidal Thoughts:  No  Memory:  Immediate;   Fair Recent;   Fair Remote;   Fair  Judgement:  Impaired  Insight:  Fair  Psychomotor Activity:  Increased  Concentration:  Concentration: Fair and Attention Span: Fair  Recall:  [employer] of Knowledge:  Fair  Language:  Fair  Akathisia:  Negative  Handed:  Right  AIMS (if indicated):     Assets:  Desire for Improvement Resilience  ADL's:  Intact  Cognition:  WNL  Sleep:         COGNITIVE FEATURES THAT CONTRIBUTE TO RISK:  Thought constriction (tunnel vision)    SUICIDE RISK:   Mild:  Suicidal ideation of limited frequency, intensity, duration, and specificity.  There are no identifiable plans, no associated intent, mild dysphoria and related symptoms, good self-control (both objective and subjective assessment), few other risk factors, and identifiable protective factors, including available and accessible social support.  PLAN OF CARE: Patient is seen and examined.  Patient is a 63 year old female with the above-stated past psychiatric history who was admitted secondary to suicidal ideation as well as benzodiazepine dependence.  She will be admitted to the hospital.  She will be integrated into the milieu.  She will be encouraged to attend groups.  She will be weaned off the lorazepam using the CIWA scale.  She will initially start on 2 mg twice a day, and she will have 1 mg p.o. every 6 hours PRN a CIWA greater than 10.  Her Cymbalta 120 mg p.o. daily as well as her Symmetrel 100 mg p.o. twice daily will be continued for now.  I will add Trileptal 75 mg p.o. twice daily to augment her antidepressant treatment as well as stabilize her mood and relieve some of her anxiety.  This will be titrated during the course the hospitalization.  Although she does have diabetes and the possibility of weight gain and increased appetite might occur with mirtazapine, given the failures of other medications I will start that tonight at 7.5  mg p.o. nightly.  With regard to her medical conditions, her Lipitor work, Avapro, Linzess, Imodium, metformin, multivitamin and Zofran will be continued.  Review of her laboratories revealed a mildly elevated glucose, a mildly elevated white blood cell count, and a drug screen positive only for benzodiazepines.  Her blood pressure is mildly elevated today at 148/72, but I think that may be due to her anxiety and agitation.  I certify that inpatient services furnished can reasonably be expected to improve the patient's condition.   Sharma Covert, MD 03/04/2019, 1:23 PM

## 2019-03-05 LAB — TSH: TSH: 3.078 u[IU]/mL (ref 0.350–4.500)

## 2019-03-05 LAB — GLUCOSE, CAPILLARY
Glucose-Capillary: 91 mg/dL (ref 70–99)
Glucose-Capillary: 92 mg/dL (ref 70–99)
Glucose-Capillary: 165 mg/dL — ABNORMAL HIGH (ref 70–99)
Glucose-Capillary: 159 mg/dL — ABNORMAL HIGH (ref 70–99)

## 2019-03-05 MED ORDER — BISACODYL 10 MG RE SUPP
10.0000 mg | Freq: Once | RECTAL | Status: AC
  Administered 2019-03-05: 21:00:00 10 mg via RECTAL
  Filled 2019-03-05 (×2): qty 1

## 2019-03-05 MED ORDER — NITROFURANTOIN MONOHYD MACRO 100 MG PO CAPS
100.0000 mg | ORAL_CAPSULE | Freq: Two times a day (BID) | ORAL | Status: DC
  Administered 2019-03-05 – 2019-03-15 (×21): 100 mg via ORAL
  Filled 2019-03-05 (×28): qty 1

## 2019-03-05 MED ORDER — OXCARBAZEPINE 150 MG PO TABS
150.0000 mg | ORAL_TABLET | Freq: Two times a day (BID) | ORAL | Status: DC
  Administered 2019-03-05 – 2019-03-08 (×6): 150 mg via ORAL
  Filled 2019-03-05 (×11): qty 1

## 2019-03-05 NOTE — Plan of Care (Signed)
Nurse discussed coping skills, anxiety, depression with patient.

## 2019-03-05 NOTE — Tx Team (Signed)
Interdisciplinary Treatment and Diagnostic Plan Update  03/05/2019 Time of Session:  Jeanette Rose MRN: [id number]  Principal Diagnosis: <principal problem not specified>  Secondary Diagnoses: Active Problems:   MDD (major depressive disorder), recurrent episode, severe (HCC)   Current Medications:  Current Facility-Administered Medications  Medication Dose Route Frequency Provider Last Rate Last Dose  . acetaminophen (TYLENOL) tablet 650 mg  650 mg Oral Q6H PRN Sharma Covert, MD      . alum & mag hydroxide-simeth (MAALOX/MYLANTA) 200-200-20 MG/5ML suspension 30 mL  30 mL Oral Q4H PRN Sharma Covert, MD      . amantadine (SYMMETREL) capsule 100 mg  100 mg Oral BID Sharma Covert, MD   100 mg at 03/05/19 0757  . aspirin EC tablet 81 mg  81 mg Oral Daily Sharma Covert, MD   81 mg at 03/05/19 0757  . atorvastatin (LIPITOR) tablet 80 mg  80 mg Oral q1800 Sharma Covert, MD   80 mg at 03/04/19 1705  . DULoxetine (CYMBALTA) DR capsule 120 mg  120 mg Oral Daily Sharma Covert, MD   120 mg at 03/05/19 0800  . feeding supplement (ENSURE ENLIVE) (ENSURE ENLIVE) liquid 237 mL  237 mL Oral BID BM Sharma Covert, MD   237 mL at 03/04/19 2023  . hydrOXYzine (ATARAX/VISTARIL) tablet 25 mg  25 mg Oral Q6H PRN Sharma Covert, MD      . hydrOXYzine (ATARAX/VISTARIL) tablet 25 mg  25 mg Oral TID PRN Sharma Covert, MD   25 mg at 03/04/19 2023  . irbesartan (AVAPRO) tablet 150 mg  150 mg Oral Daily Sharma Covert, MD   150 mg at 03/05/19 0758  . linaclotide (LINZESS) capsule 145 mcg  145 mcg Oral QAC breakfast Sharma Covert, MD      . loperamide (IMODIUM) capsule 2-4 mg  2-4 mg Oral PRN Sharma Covert, MD      . LORazepam (ATIVAN) tablet 1 mg  1 mg Oral Q6H PRN Sharma Covert, MD      . LORazepam (ATIVAN) tablet 2 mg  2 mg Oral BID Sharma Covert, MD   2 mg at 03/05/19 0805  . magnesium hydroxide (MILK OF MAGNESIA) suspension 30 mL  30 mL Oral Daily  PRN Sharma Covert, MD      . metFORMIN (GLUCOPHAGE-XR) 24 hr tablet 1,000 mg  1,000 mg Oral BID WC Sharma Covert, MD   1,000 mg at 03/05/19 0757  . mirtazapine (REMERON) tablet 7.5 mg  7.5 mg Oral QHS Sharma Covert, MD   7.5 mg at 03/04/19 2113  . multivitamin with minerals tablet 1 tablet  1 tablet Oral Daily Sharma Covert, MD   1 tablet at 03/05/19 0758  . ondansetron (ZOFRAN-ODT) disintegrating tablet 4 mg  4 mg Oral Q6H PRN Sharma Covert, MD      . OXcarbazepine (TRILEPTAL) tablet 75 mg  75 mg Oral BID Sharma Covert, MD   75 mg at 03/05/19 0759  . pantoprazole (PROTONIX) EC tablet 40 mg  40 mg Oral Daily Sharma Covert, MD   40 mg at 03/05/19 [phone]  . thiamine (VITAMIN B-1) tablet 100 mg  100 mg Oral Daily Sharma Covert, MD   100 mg at 03/05/19 0758  . traZODone (DESYREL) tablet 50 mg  50 mg Oral QHS PRN Sharma Covert, MD   50 mg at 03/04/19 2113   PTA Medications: Medications Prior to Admission  Medication Sig Dispense Refill Last Dose  . amantadine (SYMMETREL) 100 MG capsule Take 100 mg by mouth See admin instructions. Take 100 mg by mouth in the morning and 100 mg at 5:30 PM daily   03/03/2019 at 1730  . aspirin (ASPIRIN LOW DOSE) 81 MG tablet Take 81 mg by mouth daily.    03/03/2019 at 0900  . atorvastatin (LIPITOR) 80 MG tablet TAKE 1 TABLET (80 MG TOTAL) BY MOUTH DAILY AT 6 PM. 90 tablet 0 03/03/2019 at 1800  . Cholecalciferol (VITAMIN D-3 PO) Take 1 capsule by mouth daily.   03/02/2019 at Unknown time  . Cyanocobalamin (VITAMIN B-12 CR PO) Take 1 tablet by mouth daily.   03/03/2019 at Unknown time  . Dulaglutide (TRULICITY) 1.5 OF/7.5ZW SOPN INJECT 1.5 MG INTO THE SKIN ONCE A WEEK. (Patient taking differently: Inject 1.5 mg into the skin every 7 (seven) days. ) 4 pen 4 03/02/2019 at Unknown time  . DULoxetine (CYMBALTA) 60 MG capsule Take 120 mg by mouth every morning.    03/03/2019 at am  . Insulin Glargine, 2 Unit Dial, (TOUJEO MAX SOLOSTAR) 300 UNIT/ML  SOPN Inject 72 Units into the skin daily before breakfast.    03/03/2019 at am  . Lancets (FREESTYLE) lancets 1 Units by Does not apply route 4 (four) times daily.   Taking  . linaclotide (LINZESS) 145 MCG CAPS capsule Take 1 capsule (145 mcg total) by mouth daily before breakfast. **NEED GI VISIT FOR ANY ADDITIONAL REFILLS** 30 capsule 0 03/03/2019 at Unknown time  . LORazepam (ATIVAN) 1 MG tablet Take 1-2 mg by mouth See admin instructions. Take 1 mg by mouth at 9:00 AM, 1 mg at 1:30 PM, 1 mg at 5:30 PM, and 2 mg at 10:15 PM for 1 week (beginning on 03/01/2019.) Take 1 mg by mouth at 9:00 AM, 1 mg at 1:30 PM, 1 mg at 5:30 PM, and 1 mg at 10:15 PM (week #2.) Further titration to be determined by Dr. Toy Care.   03/03/2019 at 1730  . metFORMIN (GLUCOPHAGE XR) 500 MG 24 hr tablet Take 2 tablets (1,000 mg total) by mouth 2 (two) times daily with a meal. 120 tablet 11 03/03/2019 at pm  . nitrofurantoin, macrocrystal-monohydrate, (MACROBID) 100 MG capsule Take 100 mg by mouth 2 (two) times a day. FOR 5 DAYS   03/03/2019 at pm  . olmesartan (BENICAR) 20 MG tablet TAKE 1 TABLET BY MOUTH EVERY DAY (Patient taking differently: Take 20 mg by mouth daily. ) 90 tablet 0 03/03/2019 at am  . omeprazole (PRILOSEC) 40 MG capsule TAKE 1 CAPSULE (40 MG TOTAL) BY MOUTH DAILY. 30 MINUTES BEFORE A MEAL. (Patient taking differently: Take 40 mg by mouth daily before breakfast. ) 90 capsule 0 03/03/2019 at am  . polyethylene glycol powder (GLYCOLAX/MIRALAX) powder Take 15-30 g by mouth daily. (Patient taking differently: Take 17 g by mouth daily as needed for mild constipation (when not taking Linzess). ) 500 g 5 unk at unk  . Vitamin D, Ergocalciferol, (DRISDOL) 50000 units CAPS capsule TAKE 1 CAPSULE (50,000 UNITS TOTAL) BY MOUTH EVERY 7 (SEVEN) DAYS. (Patient not taking: Reported on 03/03/2019) 24 capsule 0 Not Taking at Unknown time    Patient Stressors: Financial difficulties Marital or family conflict Medication change or  noncompliance Occupational concerns Substance abuse Traumatic event  Patient Strengths: Ability for insight Average or above average intelligence Capable of independent living Communication skills General fund of knowledge Motivation for treatment/growth Supportive family/friends  Treatment Modalities: Medication Management, Group  therapy, Case management,  1 to 1 session with clinician, Psychoeducation, Recreational therapy.   Physician Treatment Plan for Primary Diagnosis: <principal problem not specified> Long Term Goal(s): Improvement in symptoms so as ready for discharge Improvement in symptoms so as ready for discharge   Short Term Goals: Ability to identify changes in lifestyle to reduce recurrence of condition will improve Ability to verbalize feelings will improve Ability to disclose and discuss suicidal ideas Ability to demonstrate self-control will improve Ability to identify and develop effective coping behaviors will improve Ability to maintain clinical measurements within normal limits will improve Ability to identify changes in lifestyle to reduce recurrence of condition will improve Ability to verbalize feelings will improve Ability to disclose and discuss suicidal ideas Ability to demonstrate self-control will improve Ability to identify and develop effective coping behaviors will improve Ability to maintain clinical measurements within normal limits will improve  Medication Management: Evaluate patient's response, side effects, and tolerance of medication regimen.  Therapeutic Interventions: 1 to 1 sessions, Unit Group sessions and Medication administration.  Evaluation of Outcomes: Not Met  Physician Treatment Plan for Secondary Diagnosis: Active Problems:   MDD (major depressive disorder), recurrent episode, severe ([place])  Long Term Goal(s): Improvement in symptoms so as ready for discharge Improvement in symptoms so as ready for discharge   Short Term  Goals: Ability to identify changes in lifestyle to reduce recurrence of condition will improve Ability to verbalize feelings will improve Ability to disclose and discuss suicidal ideas Ability to demonstrate self-control will improve Ability to identify and develop effective coping behaviors will improve Ability to maintain clinical measurements within normal limits will improve Ability to identify changes in lifestyle to reduce recurrence of condition will improve Ability to verbalize feelings will improve Ability to disclose and discuss suicidal ideas Ability to demonstrate self-control will improve Ability to identify and develop effective coping behaviors will improve Ability to maintain clinical measurements within normal limits will improve     Medication Management: Evaluate patient's response, side effects, and tolerance of medication regimen.  Therapeutic Interventions: 1 to 1 sessions, Unit Group sessions and Medication administration.  Evaluation of Outcomes: Not Met   RN Treatment Plan for Primary Diagnosis: <principal problem not specified> Long Term Goal(s): Knowledge of disease and therapeutic regimen to maintain health will improve  Short Term Goals: Ability to verbalize feelings will improve, Ability to disclose and discuss suicidal ideas and Ability to identify and develop effective coping behaviors will improve  Medication Management: RN will administer medications as ordered by provider, will assess and evaluate patient's response and provide education to patient for prescribed medication. RN will report any adverse and/or side effects to prescribing provider.  Therapeutic Interventions: 1 on 1 counseling sessions, Psychoeducation, Medication administration, Evaluate responses to treatment, Monitor vital signs and CBGs as ordered, Perform/monitor CIWA, COWS, AIMS and Fall Risk screenings as ordered, Perform wound care treatments as ordered.  Evaluation of Outcomes:  Not Met   LCSW Treatment Plan for Primary Diagnosis: <principal problem not specified> Long Term Goal(s): Safe transition to appropriate next level of care at discharge, Engage patient in therapeutic group addressing interpersonal concerns.  Short Term Goals: Engage patient in aftercare planning with referrals and resources  Therapeutic Interventions: Assess for all discharge needs, 1 to 1 time with Social worker, Explore available resources and support systems, Assess for adequacy in community support network, Educate family and significant other(s) on suicide prevention, Complete Psychosocial Assessment, Interpersonal group therapy.  Evaluation of Outcomes: Not Met  Progress in Treatment: Attending groups: No. New to unit  Participating in groups: No. Taking medication as prescribed: Yes. Toleration medication: Yes. Family/Significant other contact made: No, will contact:  the patient's sister Patient understands diagnosis: Yes. Discussing patient identified problems/goals with staff: Yes. Medical problems stabilized or resolved: Yes. Denies suicidal/homicidal ideation: Yes. Issues/concerns per patient self-inventory: No. Other:   New problem(s) identified: None   New Short Term/Long Term Goal(s):medication stabilization, elimination of SI thoughts, development of comprehensive mental wellness plan.    Patient Goals:    Discharge Plan or Barriers: Patient plans to return home. CSW will continue to follow and assess for appropriate referrals and possible discharge plans.   Reason for Continuation of Hospitalization: Anxiety Depression Medication stabilization Suicidal ideation  Estimated Length of Stay: 3-5 days   Attendees: Patient: 03/05/2019 10:42 AM  Physician: Dr. Myles Lipps, MD 03/05/2019 10:42 AM  Nursing: Rise Paganini.Raliegh Ip, RN 03/05/2019 10:42 AM  RN Care Manager: 03/05/2019 10:42 AM  Social Worker: [name], [place] 03/05/2019 10:42 AM  Recreational Therapist:  03/05/2019  10:42 AM  Other:  03/05/2019 10:42 AM  Other:  03/05/2019 10:42 AM  Other: 03/05/2019 10:42 AM    Scribe for Treatment Team: Marylee Floras, [place] 03/05/2019 10:42 AM

## 2019-03-05 NOTE — [facility] Suicide Risk Assessment (Signed)
[facility] INPATIENT:  Family/Significant Other Suicide Prevention Education  Suicide Prevention Education:  Contact Attempts: with sister, [name] [phone]) has been identified by the patient as the family member/significant other with whom the patient will be residing, and identified as the person(s) who will aid the patient in the event of a mental health crisis.  With written consent from the patient, two attempts were made to provide suicide prevention education, prior to and/or following the patient's discharge.  We were unsuccessful in providing suicide prevention education.  A suicide education pamphlet was given to the patient to share with family/significant other.  Date and time of first attempt:03/05/2019 / 2:53pm   Marylee Floras 03/05/2019, 2:53 PM

## 2019-03-05 NOTE — Progress Notes (Signed)
[facility] MD Progress Note  03/05/2019 1:06 PM Jaryn Hocutt  MRN:  [id number] Subjective:  Patient is a 63 year old female with a past psychiatric history significant for posttraumatic stress disorder, generalized anxiety disorder, major depression and benzodiazepine dependence who presented to the [facility] Emergency department on 03/04/2019 with suicidal ideation.   Objective: Patient is seen and examined.  Patient is a 63 year old female with the above-stated past psychiatric history who is seen in follow-up.  She is essentially unchanged from yesterday.  She did state that she did sleep a bit better last night with the Remeron.  She is still very anxious, tearful, depressed.  She stated she still does not "want to be here".  Elevated the Trileptal for anxiety, but does not feel it is been beneficial at the low dose that it is at.  From a medical standpoint she stated she had been recently been treated for urinary tract infection with nitrofurantoin, and that it only been in place for 1 day.  A urinalysis was not obtained on admission.  She also stated that her diabetes treatment included Toujeo, but her blood sugars been fine since she has been in the hospital on metformin alone.  She also stated she was not taking the Linzess because of insurance reasons.  She did complain of chronic constipation.  She still remains helpless, hopeless and worthless.  She continues to have death thoughts.  Her blood pressure stable, she is tachycardic with a rate of 118 today.  Her she is afebrile.  Her CIWA was only 1 with his last reading.  She slept 6.5 hours last night.  Her blood sugar this morning is 92.  Her EKG showed a sinus tachycardia but no other abnormalities.  Principal Problem: <principal problem not specified> Diagnosis: Active Problems:   MDD (major depressive disorder), recurrent episode, severe ([place])  Total Time spent with patient: 20 minutes  Past Psychiatric History: See admission H&P  Past  Medical History:  Past Medical History:  Diagnosis Date  . Acute acalculous cholecystitis s/p lap chole 04/03/2014 04/02/2014  . Allergy   . Anemia   . Anxiety   . Asthma   . Depression   . Fatty liver    noted on CT per [facility] records  . Fibromyalgia   . GERD (gastroesophageal reflux disease)   . Hyperlipidemia associated with type 2 diabetes mellitus ([place])   . Hypertension   . Hypothyroid 2015   Transiently in 2015. Synthroid 51mg was stopped 05/07/2016 w/ tsh nml following.  . Obesity (BMI 30-39.9)   . OSA (obstructive sleep apnea)    non-compliant with CPAP  . Seborrheic dermatitis    on face and scalp, seen at [facility]  . Stroke ([facility]    TIA  . [place] 2 diabetes mellitus [place]     Past Surgical History:  Procedure Laterality Date  . BREAST REDUCTION SURGERY  06/2008  . CHOLECYSTECTOMY N/A 04/03/2014   Procedure: LAPAROSCOPIC CHOLECYSTECTOMY ;  Surgeon: SAdin Hector MD;  Location: [facility] ORS;  Service: General;  Laterality: N/A;  . REDUCTION MAMMAPLASTY    . TUBAL LIGATION     Family History:  Family History  Problem Relation Age of Onset  . Atrial fibrillation Mother   . Heart disease Father        CAD  . Diabetes Brother   . Cancer Maternal Grandmother   . Diabetes Paternal Grandmother   . Fibromyalgia Sister   . Colitis Sister    Family Psychiatric  History:  See admission H&P Social History:  Social History   Substance and Sexual Activity  Alcohol Use No     Social History   Substance and Sexual Activity  Drug Use No    Social History   Socioeconomic History  . Marital status: Divorced    Spouse name: Not on file  . Number of children: Not on file  . Years of education: Not on file  . Highest education level: Not on file  Occupational History  . Not on file  Social Needs  . Financial resource strain: Not on file  . Food insecurity:    Worry: Not on file    Inability: Not on file  . Transportation needs:    Medical: Not on file     Non-medical: Not on file  Tobacco Use  . Smoking status: Former Smoker    Packs/day: 4.00    Years: 15.00    Pack years: 60.00    Types: Cigarettes    Last attempt to quit: 04/22/2004    Years since quitting: 14.8  . Smokeless tobacco: Never Used  . Tobacco comment: 4 pack year hx  Substance and Sexual Activity  . Alcohol use: No  . Drug use: No  . Sexual activity: Never  Lifestyle  . Physical activity:    Days per week: Not on file    Minutes per session: Not on file  . Stress: Not on file  Relationships  . Social connections:    Talks on phone: Not on file    Gets together: Not on file    Attends religious service: Not on file    Active member of club or organization: Not on file    Attends meetings of clubs or organizations: Not on file    Relationship status: Not on file  Other Topics Concern  . Not on file  Social History Narrative  . Not on file   Additional Social History:    Pain Medications: see MAR Prescriptions: see MAR Over the Counter: see MAR History of alcohol / drug use?: No history of alcohol / drug abuse Longest period of sobriety (when/how long): no drug/alcohol/tobacco use Negative Consequences of Use: Financial, Personal relationships Withdrawal Symptoms: Tremors                    Sleep: Good  Appetite:  Fair  Current Medications: Current Facility-Administered Medications  Medication Dose Route Frequency Provider Last Rate Last Dose  . acetaminophen (TYLENOL) tablet 650 mg  650 mg Oral Q6H PRN Sharma Covert, MD      . alum & mag hydroxide-simeth (MAALOX/MYLANTA) 200-200-20 MG/5ML suspension 30 mL  30 mL Oral Q4H PRN Sharma Covert, MD      . amantadine (SYMMETREL) capsule 100 mg  100 mg Oral BID Sharma Covert, MD   100 mg at 03/05/19 0757  . aspirin EC tablet 81 mg  81 mg Oral Daily Sharma Covert, MD   81 mg at 03/05/19 0757  . atorvastatin (LIPITOR) tablet 80 mg  80 mg Oral q1800 Sharma Covert, MD   80 mg at  03/04/19 1705  . DULoxetine (CYMBALTA) DR capsule 120 mg  120 mg Oral Daily Sharma Covert, MD   120 mg at 03/05/19 0800  . feeding supplement (ENSURE ENLIVE) (ENSURE ENLIVE) liquid 237 mL  237 mL Oral BID BM Sharma Covert, MD   237 mL at 03/05/19 1108  . hydrOXYzine (ATARAX/VISTARIL) tablet 25 mg  25 mg Oral  Q6H PRN Sharma Covert, MD      . hydrOXYzine (ATARAX/VISTARIL) tablet 25 mg  25 mg Oral TID PRN Sharma Covert, MD   25 mg at 03/05/19 1111  . irbesartan (AVAPRO) tablet 150 mg  150 mg Oral Daily Sharma Covert, MD   150 mg at 03/05/19 0758  . loperamide (IMODIUM) capsule 2-4 mg  2-4 mg Oral PRN Sharma Covert, MD      . LORazepam (ATIVAN) tablet 1 mg  1 mg Oral Q6H PRN Sharma Covert, MD      . LORazepam (ATIVAN) tablet 2 mg  2 mg Oral BID Sharma Covert, MD   2 mg at 03/05/19 0805  . magnesium hydroxide (MILK OF MAGNESIA) suspension 30 mL  30 mL Oral Daily PRN Sharma Covert, MD      . metFORMIN (GLUCOPHAGE-XR) 24 hr tablet 1,000 mg  1,000 mg Oral BID WC Sharma Covert, MD   1,000 mg at 03/05/19 0757  . mirtazapine (REMERON) tablet 7.5 mg  7.5 mg Oral QHS Sharma Covert, MD   7.5 mg at 03/04/19 2113  . multivitamin with minerals tablet 1 tablet  1 tablet Oral Daily Sharma Covert, MD   1 tablet at 03/05/19 0758  . nitrofurantoin (macrocrystal-monohydrate) (MACROBID) capsule 100 mg  100 mg Oral Q12H Sharma Covert, MD      . ondansetron (ZOFRAN-ODT) disintegrating tablet 4 mg  4 mg Oral Q6H PRN Sharma Covert, MD      . OXcarbazepine (TRILEPTAL) tablet 150 mg  150 mg Oral BID Sharma Covert, MD      . pantoprazole (PROTONIX) EC tablet 40 mg  40 mg Oral Daily Sharma Covert, MD   40 mg at 03/05/19 [phone]  . thiamine (VITAMIN B-1) tablet 100 mg  100 mg Oral Daily Sharma Covert, MD   100 mg at 03/05/19 0758  . traZODone (DESYREL) tablet 50 mg  50 mg Oral QHS PRN Sharma Covert, MD   50 mg at 03/04/19 2113    Lab Results:   Results for orders placed or performed during the hospital encounter of 03/04/19 (from the past 48 hour(s))  Glucose, capillary     Status: Abnormal   Collection Time: 03/04/19  4:49 PM  Result Value Ref Range   Glucose-Capillary 156 (H) 70 - 99 mg/dL  Glucose, capillary     Status: Abnormal   Collection Time: 03/04/19  8:47 PM  Result Value Ref Range   Glucose-Capillary 108 (H) 70 - 99 mg/dL  Glucose, capillary     Status: None   Collection Time: 03/05/19  5:47 AM  Result Value Ref Range   Glucose-Capillary 91 70 - 99 mg/dL  TSH     Status: None   Collection Time: 03/05/19  6:29 AM  Result Value Ref Range   TSH 3.078 0.350 - 4.500 uIU/mL    Comment: Performed by a 3rd Generation assay with a functional sensitivity of <=0.01 uIU/mL. Performed at [facility], [place] [address], [place], [place] [address]   Glucose, capillary     Status: None   Collection Time: 03/05/19 12:03 PM  Result Value Ref Range   Glucose-Capillary 92 70 - 99 mg/dL    Blood Alcohol level:  Lab Results  Component Value Date   ETH <10 60/06/9322    Metabolic Disorder Labs: Lab Results  Component Value Date   HGBA1C 9.5 (A) 10/09/2018   MPG 180 (H) 04/03/2014  No results found for: PROLACTIN Lab Results  Component Value Date   CHOL 174 10/09/2018   TRIG 210 (H) 10/09/2018   HDL 44 10/09/2018   CHOLHDL 4.0 10/09/2018   LDLCALC 88 10/09/2018   LDLCALC 77 06/28/2018    Physical Findings: AIMS: Facial and Oral Movements Muscles of Facial Expression: None, normal Lips and Perioral Area: None, normal Jaw: None, normal Tongue: None, normal,Extremity Movements Upper (arms, wrists, hands, fingers): None, normal Lower (legs, knees, ankles, toes): None, normal, Trunk Movements Neck, shoulders, hips: None, normal, Overall Severity Severity of abnormal movements (highest score from questions above): None, normal Incapacitation due to abnormal movements: None, normal Patient's  awareness of abnormal movements (rate only patient's report): No Awareness, Dental Status Current problems with teeth and/or dentures?: No Does patient usually wear dentures?: No  CIWA:  CIWA-Ar Total: 1 COWS:  COWS Total Score: 10  Musculoskeletal: Strength & Muscle Tone: within normal limits Gait & Station: normal Patient leans: N/A  Psychiatric Specialty Exam: Physical Exam  Nursing note and vitals reviewed. Constitutional: She is oriented to person, place, and time. She appears well-developed and well-nourished.  HENT:  Head: Normocephalic and atraumatic.  Respiratory: Effort normal.  Neurological: She is alert and oriented to person, place, and time.    ROS  Blood pressure 129/82, pulse (!) 118, temperature 98.7 F (37.1 C), temperature source Oral, resp. rate 18, height 5' 4"  (1.626 m), weight 76.7 kg, SpO2 99 %.Body mass index is 29.01 kg/m.  General Appearance: Casual  Eye Contact:  Fair  Speech:  Normal Rate  Volume:  Normal  Mood:  Anxious and Depressed  Affect:  Congruent  Thought Process:  Coherent and Descriptions of Associations: Intact  Orientation:  Full (Time, Place, and Person)  Thought Content:  Rumination  Suicidal Thoughts:  Yes.  without intent/plan  Homicidal Thoughts:  No  Memory:  Immediate;   Fair Recent;   Fair Remote;   Fair  Judgement:  Intact  Insight:  Fair  Psychomotor Activity:  Increased  Concentration:  Concentration: Fair and Attention Span: Fair  Recall:  [employer] of Knowledge:  Fair  Language:  Fair  Akathisia:  Negative  Handed:  Right  AIMS (if indicated):     Assets:  Desire for Improvement Resilience  ADL's:  Intact  Cognition:  WNL  Sleep:  Number of Hours: 6.5     Treatment Plan Summary: Daily contact with patient to assess and evaluate symptoms and progress in treatment, Medication management and Plan : Patient is seen and examined.  Patient is a 63 year old female with the above-stated past psychiatric history  who is seen in follow-up.   Diagnosis: #1 posttraumatic stress disorder, #2 major depression, recurrent, severe without psychotic features, #3 generalized anxiety disorder, #4 benzodiazepine dependence, #5 diabetes mellitus type 2 #6 urinary tract infection, #7 hyperlipidemia  Patient is seen in follow-up.  She is essentially unchanged from yesterday.  She did sleep better with the Remeron.  I am going to increase her Trileptal 250 mg p.o. twice daily.  I am going to keep the Remeron at 7.5 mg p.o. nightly.  No change in her Cymbalta today.  She continues on lorazepam 2 mg p.o. twice daily with a 1 mg dose for any withdrawal symptoms.  I have encouraged her to attend groups and work on her coping skills as well.  We will place the nitrofurantoin on board for her urine, and at least continue the Metformin as solo treatment for her diabetes.  Her blood sugar was only 92 this morning.  Her blood pressure stable currently on the Avapro. 1.  Continue amantadine 100 mg p.o. twice daily for depression. 2.  Continue with a coated aspirin 81 mg p.o. daily for heart health. 3.  Continue Lipitor 80 mg p.o. every afternoon for hyperlipidemia. 4.  Continue hydroxyzine 25 mg p.o. every 6 hours as needed anxiety. 5.  Continue Avapro 150 mg p.o. daily for hypertension. 6.  Continue lorazepam 1 mg p.o. every 6 hours PRN CIWA greater than 10.  Also continue 2 mg p.o. twice daily for anxiety. 7.  Continue Metformin extended release 1000 mg p.o. twice daily with food for diabetes. 8.  Continue mirtazapine 7.5 mg p.o. nightly for mood, anxiety as well as sedation. 9.  Restart nitrofurantoin 100 mg p.o. twice daily for urinary tract infection. 10.  Continue thiamine 100 mg p.o. daily for nutritional supplementation. 11.  Increase Trileptal 250 mg p.o. twice daily for mood stability, anxiety. 12.  Continue Protonix 40 mg p.o. daily for gastric protection. 13.  Continue thiamine 100 mg p.o. daily for nutritional  supplementation. 14.  Continue trazodone 50 mg p.o. nightly as needed insomnia. 15.  Disposition planning-in progress. Sharma Covert, MD 03/05/2019, 1:06 PM

## 2019-03-05 NOTE — Plan of Care (Signed)
D: Patient is alert, tearful, and cooperative. Denies SI, HI, AVH, and verbally contracts for safety. Patient reports anxiety, feeling scared, and a headache rated 2/10. She declines tylenol for headache.    A: Medications administered per MD order. Support provided. Patient educated on safety on the unit and medications. Routine safety checks every 15 minutes. Patient stated understanding to tell nurse about any new physical symptoms. Patient understands to tell staff of any needs.     R: No adverse drug reactions noted. Patient verbally contracts for safety. Patient remains safe at this time and will continue to monitor.   Problem: Education: Goal: Knowledge of [facility] Education information/materials will improve Outcome: Progressing   Patient oriented to the unit. Patient remains safe and will continue to monitor.   [facility] NOVEL CORONAVIRUS (COVID-19) DAILY CHECK-OFF SYMPTOMS - answer yes or no to each - every day NO YES  Have you had a fever in the past 24 hours?  Fever (Temp > 37.80C / 100F) X   Have you had any of these symptoms in the past 24 hours? New Cough  Sore Throat   Shortness of Breath  Difficulty Breathing  Unexplained Body Aches   X   Have you had any one of these symptoms in the past 24 hours not related to allergies?   Runny Nose  Nasal Congestion  Sneezing   X   If you have had runny nose, nasal congestion, sneezing in the past 24 hours, has it worsened?  X   EXPOSURES - check yes or no X   Have you traveled outside the state in the past 14 days?  X   Have you been in contact with someone with a confirmed diagnosis of COVID-19 or PUI in the past 14 days without wearing appropriate PPE?  X   Have you been living in the same home as a person with confirmed diagnosis of COVID-19 or a PUI (household contact)?    X   Have you been diagnosed with COVID-19?    X              What to do next: Answered NO to all: Answered YES to anything:   Proceed  with unit schedule Follow the [facility] Inpatient Flowsheet.

## 2019-03-05 NOTE — Plan of Care (Signed)
D: Patient is on the phone on approach and is upset. She reports her dog isn't able to get out of it's crate much while she is here and it upset her on the phone. Patient is alert, oriented, and cooperative. Denies SI, HI, AVH, and verbally contracts for safety. Patient reports being less tearful and anxious later in the day today and she looks improved from yesterday. Patient sleep well last night. She complains of unrelieved constipation. Laxative suppository order obtained and administered per order.    A: Medications administered per MD order. Support provided. Patient educated on safety on the unit and medications. Routine safety checks every 15 minutes. Patient stated understanding to tell nurse about any new physical symptoms. Patient understands to tell staff of any needs.     R: No adverse drug reactions noted. Patient verbally contracts for safety. Patient remains safe at this time and will continue to monitor.   Problem: Education: Goal: Emotional status will improve Outcome: Progressing   Problem: Activity: Goal: Sleeping patterns will improve Outcome: Progressing  Patient reports being less tearful and anxious later in the day today and she looks improved from yesterday. Patient sleep well last night.   NOVEL CORONAVIRUS (COVID-19) DAILY CHECK-OFF SYMPTOMS - answer yes or no to each - every day NO YES  Have you had a fever in the past 24 hours?  Fever (Temp > 37.80C / 100F) X   Have you had any of these symptoms in the past 24 hours? New Cough  Sore Throat   Shortness of Breath  Difficulty Breathing  Unexplained Body Aches   X   Have you had any one of these symptoms in the past 24 hours not related to allergies?   Runny Nose  Nasal Congestion  Sneezing   X   If you have had runny nose, nasal congestion, sneezing in the past 24 hours, has it worsened?  X   EXPOSURES - check yes or no X   Have you traveled outside the state in the past 14 days?  X   Have  you been in contact with someone with a confirmed diagnosis of COVID-19 or PUI in the past 14 days without wearing appropriate PPE?  X   Have you been living in the same home as a person with confirmed diagnosis of COVID-19 or a PUI (household contact)?    X   Have you been diagnosed with COVID-19?    X              What to do next: Answered NO to all: Answered YES to anything:   Proceed with unit schedule Follow the [facility] Inpatient Flowsheet.

## 2019-03-05 NOTE — Progress Notes (Signed)
Adult Psychoeducational Group Note  Date:  03/05/2019 Time:  11:02 AM  Group Topic/Focus:  Developing a Wellness Toolbox:   The focus of this group is to help patients develop a "wellness toolbox" with skills and strategies to promote recovery upon discharge.  Participation Level:  Active  Participation Quality:  Appropriate  Affect:  Appropriate  Cognitive:  Alert  Insight: Appropriate  Engagement in Group:  Engaged  Modes of Intervention:  Discussion and Education  Additional Comments:  Pt was able to share appropriately during the group session.  Lenix Benoist E 03/05/2019, 11:02 AM

## 2019-03-05 NOTE — B[facility] Counselor (Signed)
Adult Comprehensive Assessment  Patient ID: Jeanette Rose, female   DOB: 1956-02-28, 63 y.o.   MRN: [id number]  Information Source: Information source: Patient  Current Stressors:  Patient states their primary concerns and needs for treatment are:: "My family was afraid I was going to hurt myself"  Patient states their goals for this hospitilization and ongoing recovery are:: Medication adjustments; decrease/eliminate suicidal ideation; Decrease anxiety symptoms Educational / Learning stressors: N/A  Employment / Job issues: On disability; Patient denies any stressors  Family Relationships: Patient reports she has a distant relationship with her daughter and grandson; She reports "I dont know why she barely deals with me"  Financial / Lack of resources (include bankruptcy): Limited income; SSDI; Patient reports she struggles paying for her outpatient follow up provider due to costly co-pay.  Housing / Lack of housing: Lives alone in [place], [place]; Patient shared that her son's belongings are still in the house and that when she looks at them she becomes "very overwhelmed"  Physical health (include injuries & life threatening diseases): Diabetes II Social relationships: Patient reports having no social relationships at this time.  Substance abuse: Patient denies any substance use  Bereavement / Loss: Patient reports she continues to struggle with the passing of her son 8 1/2 years ago; Patient reports she does not know how to cope with her son's death; PTSD   Living/Environment/Situation:  Living Arrangements: Alone Living conditions (as described by patient or guardian): "Good"  Who else lives in the home?: Alone  How long has patient lived in current situation?: 11 years  What is atmosphere in current home: Comfortable, Other (Comment)(Overwhelming)  Family History:  Marital status: Divorced Divorced, when?: 2001  What types of issues is patient dealing with in the relationship?: Patient  reports her ex-husband struggled with alcoholism, was very controlling and insecure  Are you sexually active?: No What is your sexual orientation?: Heterosexual  Has your sexual activity been affected by drugs, alcohol, medication, or emotional stress?: No  Does patient have children?: Yes How many children?: 1 How is patient's relationship with their children?: Patient reports having a strained and distant relationship with her daughter.   Childhood History:  By whom was/is the patient raised?: Both parents Description of patient's relationship with caregiver when they were a child: Patient reports having a good relationship with her parents during her childhood. ` Patient's description of current relationship with people who raised him/her: Patient reports she continues to have a good relationship with her mother. She states her father is currently deceased.  How were you disciplined when you got in trouble as a child/adolescent?: Whoopings/Switches  Does patient have siblings?: Yes Number of Siblings: 3 Description of patient's current relationship with siblings: Patient reports having a distant relationship with her two brothers, however she and her sister are very close  Did patient suffer any verbal/emotional/physical/sexual abuse as a child?: No Did patient suffer from severe childhood neglect?: No Has patient ever been sexually abused/assaulted/raped as an adolescent or adult?: No Was the patient ever a victim of a crime or a disaster?: No Witnessed domestic violence?: No Has patient been effected by domestic violence as an adult?: No  Education:  Highest grade of school patient has completed: GED Currently a Ship broker?: No Learning disability?: No  Employment/Work Situation:   Employment situation: On disability Why is patient on disability: PTSD/Anxiety; Mental health  How long has patient been on disability: 2 years Patient's job has been impacted by current illness: No What  is  the longest time patient has a held a job?: 25 years  Where was the patient employed at that time?: [employer]  Did You Receive Any Psychiatric Treatment/Services While in the [employer]?: No Are There Guns or Other Weapons in [place]?: No  Financial Resources:   Museum/gallery curator resources: Teacher, early years/pre, Multimedia programmer Does patient have a Programmer, applications or guardian?: No  Alcohol/Substance Abuse:   What has been your use of drugs/alcohol within the last 12 months?: Denies  If attempted suicide, did drugs/alcohol play a role in this?: No Alcohol/Substance Abuse Treatment Hx: Denies past history Has alcohol/substance abuse ever caused legal problems?: No  Social Support System:   Pensions consultant Support System: Good Describe Community Support System: "My sister"  Type of faith/religion: Christianity  How does patient's faith help to cope with current illness?: Prayer   Leisure/Recreation:   Leisure and Hobbies: "Nothing"   Strengths/Needs:   What is the patient's perception of their strengths?: "I dont know right now"  Patient states they can use these personal strengths during their treatment to contribute to their recovery: To be determined  Patient states these barriers may affect/interfere with their treatment: None  Patient states these barriers may affect their return to the community: "My anxiety"  Other important information patient would like considered in planning for their treatment: No   Discharge Plan:   Currently receiving community mental health services: Yes (From Whom)(Dr. Chucky May ) Patient states concerns and preferences for aftercare planning are: Patient expressed interest in continuing to follow up with her current provider; CSW will continue to assess Patient states they will know when they are safe and ready for discharge when: No, not at this time.  Does patient have access to transportation?: Yes Does patient have financial barriers  related to discharge medications?: No Will patient be returning to same living situation after discharge?: Yes  Summary/Recommendations:   Summary and Recommendations (to be completed by the evaluator): Jeanette Rose is a 63 year old female who is diagnosed with MDD (major depressive disorder), recurrent episode, severe. She presented to the hospital seeking treatment for suicidal ideation, worsening anxiety and worsening depressive symptoms. During the assessment, Jeanette Rose was tearful and presented with a depressed affect, however she was able to participate in the assessment process. Jeanette Rose states that she came to the hospital because she was "having thoughts of not being here anymore". Jeanette Rose states that since her son's passing 8 years ago, she has not been able to cope with his death. She reports experiencing increased anxiety and depressive symtpoms. She also identified her strained relationship with her daughter as a contributing stressor. Jeanette Rose follows up with Dr. Chucky May for medication management servies. Jeanette Rose can benefit from crisis stabilization, medication management, therapeutic milieu and referral services.   Jeanette Rose. 03/05/2019

## 2019-03-05 NOTE — Progress Notes (Signed)
DL    Patient's self inventory sheet, patient sleeps good, no sleep medication.  Good appetite, low energy level, poor concentration.  Rated depression and hopeless 6,        Anxiety 10.  Withdrawals, tremors.  Denied SI.  Denied physical problems.  Denied physical pain.  Goal is to lessen anxiety, withdrawing from medicine. Plans to attend group, talk to MD.  No discharge plans. A:  Medications administered per MD orders.  Emotional support and encouragement given patient. R:  Denied SI and HI, contracts for safety.  Denied A/V hallucinations.  Safety maintained with 15 minute checks.

## 2019-03-06 DIAGNOSIS — F332 Major depressive disorder, recurrent severe without psychotic features: Principal | ICD-10-CM

## 2019-03-06 LAB — GLUCOSE, CAPILLARY
Glucose-Capillary: 89 mg/dL (ref 70–99)
Glucose-Capillary: 95 mg/dL (ref 70–99)
Glucose-Capillary: 143 mg/dL — ABNORMAL HIGH (ref 70–99)
Glucose-Capillary: 170 mg/dL — ABNORMAL HIGH (ref 70–99)

## 2019-03-06 MED ORDER — AMANTADINE HCL 100 MG PO CAPS
100.0000 mg | ORAL_CAPSULE | Freq: Every day | ORAL | Status: DC
  Administered 2019-03-07 – 2019-03-09 (×3): 100 mg via ORAL
  Filled 2019-03-06 (×4): qty 1

## 2019-03-06 MED ORDER — MAGNESIUM CITRATE PO SOLN
1.0000 | Freq: Once | ORAL | Status: AC
  Administered 2019-03-06: 16:00:00 1 via ORAL

## 2019-03-06 NOTE — Progress Notes (Signed)
Patient ID: Rosaline Ezekiel, female   DOB: 30-Jun-1956, 63 y.o.   MRN: [id number]  Nursing Progress Note [id number]  Patient presents pleasant on approach and is seen visible in the milieu. Patient was anxious and tearful after the Chaplain group today but did brighten later on. Patient compliant with scheduled medications. Patient is seen attending groups and visible in the milieu. Patient currently denies SI/HI/AVH.   Patient is educated about and provided medication per provider's orders. Patient safety maintained with q15 min safety checks and low fall risk precautions. Emotional support given, 1:1 interaction, and active listening provided. Patient encouraged to attend meals, groups, and work on treatment plan and goals. Labs, vital signs and patient behavior monitored throughout shift.   Patient contracts for safety with staff. Patient remains safe on the unit at this time and agrees to come to staff with any issues/concerns. Patient is interacting with peers appropriately on the unit. Will continue to support and monitor.

## 2019-03-06 NOTE — Progress Notes (Signed)
[facility] MD Progress Note  03/06/2019 9:33 AM Jeanette Rose  MRN:  [id number] Subjective:  Patient reports persistent depression, anxiety. Denies medication side effects at this time. Denies suicidal ideations. Currently denies WDL symptoms, and does not present in any acute distress or discomfort at this time. Objective : I have discussed case with treatment team and have met with patient. 63 year old female, history of depression, anxiety, PTSD ( related to son's death, who passed away from an overdose several years ago). Presented to ED reporting worsening depression, worsening anxiety, suicidal ideations. Patient reports BZD doses had been increased recently, and was also taking more than prescribed at times, up to Ativan 7 mgrs daily prior to admission. Currently patient reports feeling depressed, but acknowledges some improvement compared to admission. Presents calm, polite on approach, and does not appear to be restless or in any acute distress . No distal tremors or diaphoresis noted at this time. Current vitals- 120/71, pulse 113. Describes lingering depression, but denies suicidal ideations, and states she feels better being in hospital and working on her medication regimen. Of note, patient is expressing interest in tapering off BZDs gradually,and  expresses insight regarding potential risks associated with chronic/high dose BZD use . She also reports history of recent falls, which she attributes to BZD use. Currently gait is steady, and at this time she presents fully alert and attentive. No disruptive or agitated behaviors on unit.  CBGs today 159, 89. TSH 3.078  Principal Problem:  MDD, BZD Dependence Diagnosis: Active Problems:   MDD (major depressive disorder), recurrent episode, severe ([place])  Total Time spent with patient: 20 minutes  Past Psychiatric History:   Past Medical History:  Past Medical History:  Diagnosis Date  . Acute acalculous cholecystitis s/p lap chole 04/03/2014  04/02/2014  . Allergy   . Anemia   . Anxiety   . Asthma   . Depression   . Fatty liver    noted on CT per [facility] records  . Fibromyalgia   . GERD (gastroesophageal reflux disease)   . Hyperlipidemia associated with type 2 diabetes mellitus ([place])   . Hypertension   . Hypothyroid 2015   Transiently in 2015. Synthroid 16mg was stopped 05/07/2016 w/ tsh nml following.  . Obesity (BMI 30-39.9)   . OSA (obstructive sleep apnea)    non-compliant with CPAP  . Seborrheic dermatitis    [facility] face and [facility]  . Stroke ([facility]    TIA  . Type 2 diabetes mellitus ([place]     Past Surgical History:  Procedure Laterality Date  . BREAST REDUCTION SURGERY  06/2008  . CHOLECYSTECTOMY N/A 04/03/2014   Procedure: LAPAROSCOPIC CHOLECYSTECTOMY ;  Surgeon: SAdin Hector MD;  Location: [facility] ORS;  Service: General;  Laterality: N/A;  . REDUCTION MAMMAPLASTY    . TUBAL LIGATION     Family History:  Family History  Problem Relation Age of Onset  . Atrial fibrillation Mother   . Heart disease Father        CAD  . Diabetes Brother   . Cancer Maternal Grandmother   . Diabetes Paternal Grandmother   . Fibromyalgia Sister   . Colitis Sister    Family Psychiatric  History:  Social History:  Social History   Substance and Sexual Activity  Alcohol Use No     Social History   Substance and Sexual Activity  Drug Use No    Social History   Socioeconomic History  . Marital status: Divorced  Spouse name: Not on file  . Number of children: Not on file  . Years of education: Not on file  . Highest education level: Not on file  Occupational History  . Not on file  Social Needs  . Financial resource strain: Not on file  . Food insecurity:    Worry: Not on file    Inability: Not on file  . Transportation needs:    Medical: Not on file    Non-medical: Not on file  Tobacco Use  . Smoking status: Former Smoker    Packs/day: 4.00    Years: 15.00    Pack years: 60.00     Types: Cigarettes    Last attempt to quit: 04/22/2004    Years since quitting: 14.8  . Smokeless tobacco: Never Used  . Tobacco comment: 4 pack year hx  Substance and Sexual Activity  . Alcohol use: No  . Drug use: No  . Sexual activity: Never  Lifestyle  . Physical activity:    Days per week: Not on file    Minutes per session: Not on file  . Stress: Not on file  Relationships  . Social connections:    Talks on phone: Not on file    Gets together: Not on file    Attends religious service: Not on file    Active member of club or organization: Not on file    Attends meetings of clubs or organizations: Not on file    Relationship status: Not on file  Other Topics Concern  . Not on file  Social History Narrative  . Not on file   Additional Social History:    Pain Medications: see MAR Prescriptions: see MAR Over the Counter: see MAR History of alcohol / drug use?: No history of alcohol / drug abuse Longest period of sobriety (when/how long): no drug/alcohol/tobacco use Negative Consequences of Use: Financial, Personal relationships Withdrawal Symptoms: Tremors  Sleep: improving  Appetite:  Fair  Current Medications: Current Facility-Administered Medications  Medication Dose Route Frequency Provider Last Rate Last Dose  . acetaminophen (TYLENOL) tablet 650 mg  650 mg Oral Q6H PRN Sharma Covert, MD      . alum & mag hydroxide-simeth (MAALOX/MYLANTA) 200-200-20 MG/5ML suspension 30 mL  30 mL Oral Q4H PRN Sharma Covert, MD      . amantadine (SYMMETREL) capsule 100 mg  100 mg Oral BID Sharma Covert, MD   100 mg at 03/06/19 0737  . aspirin EC tablet 81 mg  81 mg Oral Daily Sharma Covert, MD   81 mg at 03/06/19 0737  . atorvastatin (LIPITOR) tablet 80 mg  80 mg Oral q1800 Sharma Covert, MD   80 mg at 03/05/19 1713  . DULoxetine (CYMBALTA) DR capsule 120 mg  120 mg Oral Daily Sharma Covert, MD   120 mg at 03/06/19 0737  . feeding supplement  (ENSURE ENLIVE) (ENSURE ENLIVE) liquid 237 mL  237 mL Oral BID BM Sharma Covert, MD   237 mL at 03/05/19 1108  . hydrOXYzine (ATARAX/VISTARIL) tablet 25 mg  25 mg Oral Q6H PRN Sharma Covert, MD      . hydrOXYzine (ATARAX/VISTARIL) tablet 25 mg  25 mg Oral TID PRN Sharma Covert, MD   25 mg at 03/05/19 2117  . irbesartan (AVAPRO) tablet 150 mg  150 mg Oral Daily Sharma Covert, MD   150 mg at 03/06/19 0737  . loperamide (IMODIUM) capsule 2-4 mg  2-4 mg Oral PRN  Sharma Covert, MD      . LORazepam (ATIVAN) tablet 1 mg  1 mg Oral Q6H PRN Sharma Covert, MD      . LORazepam (ATIVAN) tablet 2 mg  2 mg Oral BID Sharma Covert, MD   2 mg at 03/06/19 0736  . magnesium hydroxide (MILK OF MAGNESIA) suspension 30 mL  30 mL Oral Daily PRN Sharma Covert, MD   30 mL at 03/05/19 1759  . metFORMIN (GLUCOPHAGE-XR) 24 hr tablet 1,000 mg  1,000 mg Oral BID WC Sharma Covert, MD   1,000 mg at 03/06/19 0737  . mirtazapine (REMERON) tablet 7.5 mg  7.5 mg Oral QHS Sharma Covert, MD   7.5 mg at 03/05/19 2117  . multivitamin with minerals tablet 1 tablet  1 tablet Oral Daily Sharma Covert, MD   1 tablet at 03/06/19 0737  . nitrofurantoin (macrocrystal-monohydrate) (MACROBID) capsule 100 mg  100 mg Oral Q12H Sharma Covert, MD   100 mg at 03/06/19 0741  . ondansetron (ZOFRAN-ODT) disintegrating tablet 4 mg  4 mg Oral Q6H PRN Sharma Covert, MD      . OXcarbazepine (TRILEPTAL) tablet 150 mg  150 mg Oral BID Sharma Covert, MD   150 mg at 03/06/19 0741  . pantoprazole (PROTONIX) EC tablet 40 mg  40 mg Oral Daily Sharma Covert, MD   40 mg at 03/06/19 7829  . thiamine (VITAMIN B-1) tablet 100 mg  100 mg Oral Daily Sharma Covert, MD   100 mg at 03/06/19 0737  . traZODone (DESYREL) tablet 50 mg  50 mg Oral QHS PRN Sharma Covert, MD   50 mg at 03/05/19 2117    Lab Results:  Results for orders placed or performed during the hospital encounter of 03/04/19  (from the past 48 hour(s))  Glucose, capillary     Status: Abnormal   Collection Time: 03/04/19  4:49 PM  Result Value Ref Range   Glucose-Capillary 156 (H) 70 - 99 mg/dL  Glucose, capillary     Status: Abnormal   Collection Time: 03/04/19  8:47 PM  Result Value Ref Range   Glucose-Capillary 108 (H) 70 - 99 mg/dL  Glucose, capillary     Status: None   Collection Time: 03/05/19  5:47 AM  Result Value Ref Range   Glucose-Capillary 91 70 - 99 mg/dL  TSH     Status: None   Collection Time: 03/05/19  6:29 AM  Result Value Ref Range   TSH 3.078 0.350 - 4.500 uIU/mL    Comment: Performed by a 3rd Generation assay with a functional sensitivity of <=0.01 uIU/mL. Performed at [facility], [place] [address] [place], [place] [address]   Glucose, capillary     Status: None   Collection Time: 03/05/19 12:03 PM  Result Value Ref Range   Glucose-Capillary 92 70 - 99 mg/dL  Glucose, capillary     Status: Abnormal   Collection Time: 03/05/19  5:20 PM  Result Value Ref Range   Glucose-Capillary 165 (H) 70 - 99 mg/dL  Glucose, capillary     Status: Abnormal   Collection Time: 03/05/19  8:09 PM  Result Value Ref Range   Glucose-Capillary 159 (H) 70 - 99 mg/dL   Comment 1 Notify RN   Glucose, capillary     Status: None   Collection Time: 03/06/19  6:13 AM  Result Value Ref Range   Glucose-Capillary 89 70 - 99 mg/dL  Blood Alcohol level:  Lab Results  Component Value Date   ETH <10 93/71/6967    Metabolic Disorder Labs: Lab Results  Component Value Date   HGBA1C 9.5 (A) 10/09/2018   MPG 180 (H) 04/03/2014   No results found for: PROLACTIN Lab Results  Component Value Date   CHOL 174 10/09/2018   TRIG 210 (H) 10/09/2018   HDL 44 10/09/2018   CHOLHDL 4.0 10/09/2018   LDLCALC 88 10/09/2018   LDLCALC 77 06/28/2018    Physical Findings: AIMS: Facial and Oral Movements Muscles of Facial Expression: None, normal Lips and Perioral Area: None, normal Jaw: None,  normal Tongue: None, normal,Extremity Movements Upper (arms, wrists, hands, fingers): None, normal Lower (legs, knees, ankles, toes): None, normal, Trunk Movements Neck, shoulders, hips: None, normal, Overall Severity Severity of abnormal movements (highest score from questions above): None, normal Incapacitation due to abnormal movements: None, normal Patient's awareness of abnormal movements (rate only patient's report): No Awareness, Dental Status Current problems with teeth and/or dentures?: No Does patient usually wear dentures?: No  CIWA:  CIWA-Ar Total: 0 COWS:  COWS Total Score: 3  Musculoskeletal: Strength & Muscle Tone: within normal limits- no significant tremors, no diaphoresis, no restlessness ,appears comfortable Gait & Station: normal Patient leans: N/A  Psychiatric Specialty Exam: Physical Exam  ROS denies headache, denies visual disturbances, no chest pain, no shortness of breath, (+) constipation, denies dysuria or urgency at this time,  no fever or chills   Blood pressure 120/71, pulse (!) 116, temperature 98.7 F (37.1 C), temperature source Oral, resp. rate 18, height 5' 4"  (1.626 m), weight 76.7 kg, SpO2 98 %.Body mass index is 29.01 kg/m.  General Appearance: Fairly Groomed  Eye Contact:  Good  Speech:  Normal Rate  Volume:  Normal  Mood:  reports modest improvement compared to admission  Affect:  vaguely anxious, constricted, does smile briefly at times   Thought Process:  Linear and Descriptions of Associations: Intact  Orientation:  Full (Time, Place, and Person)  Thought Content:  denies hallucinations,no delusions, not internally preoccupied   Suicidal Thoughts:  No denies suicidal or self injurious ideations, denies homicidal or violent ideations  Homicidal Thoughts:  No  Memory:  recent and remote grossly intact   Judgement:  Other:  fair/improving  Insight:  Fair  Psychomotor Activity:  Normal- no tremors or diaphoresis  Concentration:   Concentration: Good and Attention Span: Good  Recall:  Good  Fund of Knowledge:  Good  Language:  Good  Akathisia:  Negative  Handed:  Right  AIMS (if indicated):     Assets:  Desire for Improvement Resilience  ADL's:  Intact  Cognition:  WNL  Sleep:  Number of Hours: 6.75   Assessment : 63 year old female, history of depression, anxiety, PTSD ( related to son's death, who passed away from an overdose several years ago). Presented to ED reporting worsening depression, worsening anxiety, suicidal ideations. Patient reports BZD doses had been increased recently, and was also taking more than prescribed at times, up to Ativan 7 mgrs daily prior to admission.  Today patient reports she is feeling partially better than on admission, although endorses persistent depression, anxiety. Denies SI today. She is tolerating Ativan taper well thus far , and currently presents calm, in no acute distress or discomfort . Tolerating Remeron /Trileptal well thus far . Patient also expresses interest in tapering down/off from Amantadine, which she reports was started for tremors, which she thinks were most likely related to episodes  of BZD withdrawal. Currently is not presenting with tremors, restlessness, or diaphoresis.   Treatment Plan Summary: Daily contact with patient to assess and evaluate symptoms and progress in treatment, Medication management, Plan inpatient treatment and medications as below  Continue Trileptal 150 mgrs BID for mood disorder Continue Remeron 7.5 mgrs QHS for insomnia , depression, anxiety Decrease Trazodone to 25  mgrs QHS PRN for insomnia Continue Ativan 2 mgrs BID- will taper gradually ( patient reports she was taking up to 7 mgr daily prior to admission) Continue Metformin 1000 mgrs BID for DM  Continue ASA 81 mgrs QDAY  Continue Protonix 40 mgrs QDAY for GERD symptoms/gastric protection Continue Lipitor for hypercholesterolemia Continue Cymbalta at 120 mgrs QDAY for  depression, anxiety Decrease Amantadine to 100 mgrs QDAY  Treatment team working on disposition planning options  Jenne Campus, MD 03/06/2019, 9:33 AM

## 2019-03-06 NOTE — Progress Notes (Signed)
D: Pt denies SI/HI/AVH. Pt is pleasant and cooperative. Pt stated she felt better since first coming in due to not as nervous/ scared . Pt stated she felt depressed due to not physically able to do some of the things she used to do like crocheting.   A: Pt was offered support and encouragement. Pt was given scheduled medications. Pt was encourage to attend groups. Q 15 minute checks were done for safety.  R:Pt attends groups and interacts well with peers and staff. Pt is taking medication. Pt has no complaints.Pt receptive to treatment and safety maintained on unit.

## 2019-03-06 NOTE — Progress Notes (Signed)
Adult Psychoeducational Group Note  Date:  03/06/2019 Time:  8:50 PM  Group Topic/Focus:  Wrap-Up Group:   The focus of this group is to help patients review their daily goal of treatment and discuss progress on daily workbooks.  Participation Level:  Active  Participation Quality:  Appropriate  Affect:  Appropriate  Cognitive:  Appropriate  Insight: Appropriate  Engagement in Group:  Engaged  Modes of Intervention:  Discussion  Additional Comments:  Patient attended group and said that her day was a 6. She described her day as being very optimistic.     Kodah Maret W Merritt Mccravy 09/27/1733, 8:50 PM

## 2019-03-06 NOTE — Progress Notes (Signed)
Patient ID: Jeanette Rose, female   DOB: [birth year], 63 y.o.   MRN: [id number]  [place] NOVEL CORONAVIRUS (COVID-19) DAILY CHECK-OFF SYMPTOMS - answer yes or no to each - every day NO YES  Have you had a fever in the past 24 hours?  . Fever (Temp > 37.80C / 100F) X   Have you had any of these symptoms in the past 24 hours? . New Cough .  Sore Throat  .  Shortness of Breath .  Difficulty Breathing .  Unexplained Body Aches   X   Have you had any one of these symptoms in the past 24 hours not related to allergies?   . Runny Nose .  Nasal Congestion .  Sneezing   X   If you have had runny nose, nasal congestion, sneezing in the past 24 hours, has it worsened?  X   EXPOSURES - check yes or no X   Have you traveled outside the state in the past 14 days?  X   Have you been in contact with someone with a confirmed diagnosis of COVID-19 or PUI in the past 14 days without wearing appropriate PPE?  X   Have you been living in the same home as a person with confirmed diagnosis of COVID-19 or a PUI (household contact)?    X   Have you been diagnosed with COVID-19?    X              What to do next: Answered NO to all: Answered YES to anything:   Proceed with unit schedule Follow the [facility] Inpatient Flowsheet.

## 2019-03-06 NOTE — [facility] Suicide Risk Assessment (Signed)
[place] INPATIENT:  Family/Significant Other Suicide Prevention Education  Suicide Prevention Education:  Contact Attempts: with sister, [name] [phone])  has been identified by the patient as the family member/significant other with whom the patient will be residing, and identified as the person(s) who will aid the patient in the event of a mental health crisis.  With written consent from the patient, two attempts were made to provide suicide prevention education, prior to and/or following the patient's discharge.  We were unsuccessful in providing suicide prevention education.  A suicide education pamphlet was given to the patient to share with family/significant other.   Date and time of second attempt:03/06/2019 / 2:35pm  Marylee Floras 03/06/2019, 2:34 PM

## 2019-03-06 NOTE — Progress Notes (Signed)
Pt attended spiritual care group on grief and loss facilitated by chaplain Jerene Pitch  Group Goal:  Support / Education around grief and loss Members engage in facilitated group support and psycho social education.  Group Description:  Following introductions and group rules,  Group members engaged in facilitated group dialog and support around topic of loss, with particular support around experiences of loss in their lives. Group Identified types of loss (relationships / self / things) and identified patterns, circumstances, and changes that precipitate losses. Reflected on thoughts / feelings around loss, normalized grief responses, and recognized variety in grief experience. Patient Progress:

## 2019-03-07 LAB — GLUCOSE, CAPILLARY
Glucose-Capillary: 111 mg/dL — ABNORMAL HIGH (ref 70–99)
Glucose-Capillary: 148 mg/dL — ABNORMAL HIGH (ref 70–99)

## 2019-03-07 MED ORDER — LORAZEPAM 1 MG PO TABS
1.0000 mg | ORAL_TABLET | Freq: Three times a day (TID) | ORAL | Status: DC
  Administered 2019-03-07 – 2019-03-09 (×5): 1 mg via ORAL
  Filled 2019-03-07 (×5): qty 1

## 2019-03-07 MED ORDER — PANTOPRAZOLE SODIUM 40 MG PO TBEC
40.0000 mg | DELAYED_RELEASE_TABLET | ORAL | Status: DC
  Administered 2019-03-08 – 2019-03-15 (×8): 40 mg via ORAL
  Filled 2019-03-07 (×10): qty 1

## 2019-03-07 MED ORDER — MIRTAZAPINE 15 MG PO TABS
15.0000 mg | ORAL_TABLET | Freq: Every day | ORAL | Status: DC
  Administered 2019-03-07 – 2019-03-12 (×6): 15 mg via ORAL
  Filled 2019-03-07 (×7): qty 1

## 2019-03-07 NOTE — Progress Notes (Signed)
[facility] MD Progress Note  03/07/2019 12:56 PM Jeanette Rose  MRN:  [id number] Subjective:  Patient reports some persistent depression and anxiety. Denies suicidal ideations. Denies medication side effects at this time. Thus far tolerating Ativan taper well, and does not endorse symptoms of WDL at this time. She reports persistent subjective sense of fear/anxiety/apprehension. Denies medication side effects.  Objective : I have discussed case with treatment team and have met with patient. 63 year old female, history of depression, anxiety, PTSD ( related to son's death, who passed away from an overdose several years ago). Presented to ED reporting worsening depression, worsening anxiety, suicidal ideations. Patient reports BZD doses had been increased recently, and was also taking more than prescribed at times, up to Ativan 7 mgrs daily prior to admission.  Patient reports some persistent anxiety, depression, although does endorse some improvement compared to admission. She denies suicidal plan or intent , but describes some lingering passive SI.  She presents tearful, anxious,which she attributes to recent group session which focused on grief ( patient reports persistent sense of relapse/grief related to her son).  Visible in day room, going to some groups . Denies medication side effects. Has tolerated Ativan taper well thus far and does not present with symptoms of WDL at this time. No tremors noted, no diaphoresis, no restlessness or agitation. CBG today 148.   Principal Problem:  MDD, BZD Dependence Diagnosis: Active Problems:   MDD (major depressive disorder), recurrent episode, severe ([place])  Total Time spent with patient: 20 minutes  Past Psychiatric History:   Past Medical History:  Past Medical History:  Diagnosis Date  . Acute acalculous cholecystitis s/p lap chole 04/03/2014 04/02/2014  . Allergy   . Anemia   . Anxiety   . Asthma   . Depression   . Fatty liver    noted on CT per  [facility] records  . Fibromyalgia   . GERD (gastroesophageal reflux disease)   . Hyperlipidemia associated with type 2 diabetes mellitus ([place])   . Hypertension   . Hypothyroid 2015   Transiently in 2015. Synthroid 23mg was stopped 05/07/2016 w/ tsh nml following.  . Obesity (BMI 30-39.9)   . OSA (obstructive sleep apnea)    non-compliant with CPAP  . Seborrheic dermatitis    [facility]  . Stroke ([facility]    TIA  . Type 2 diabetes mellitus ([place]     Past Surgical History:  Procedure Laterality Date  . BREAST REDUCTION SURGERY  06/2008  . CHOLECYSTECTOMY N/A 04/03/2014   Procedure: LAPAROSCOPIC CHOLECYSTECTOMY ;  Surgeon: SAdin Hector MD;  Location: [facility] ORS;  Service: General;  Laterality: N/A;  . REDUCTION MAMMAPLASTY    . TUBAL LIGATION     Family History:  Family History  Problem Relation Age of Onset  . Atrial fibrillation Mother   . Heart disease Father        CAD  . Diabetes Brother   . Cancer Maternal Grandmother   . Diabetes Paternal Grandmother   . Fibromyalgia Sister   . Colitis Sister    Family Psychiatric  History:  Social History:  Social History   Substance and Sexual Activity  Alcohol Use No     Social History   Substance and Sexual Activity  Drug Use No    Social History   Socioeconomic History  . Marital status: Divorced    Spouse name: Not on file  . Number of children: Not on file  . Years  of education: Not on file  . Highest education level: Not on file  Occupational History  . Not on file  Social Needs  . Financial resource strain: Not on file  . Food insecurity:    Worry: Not on file    Inability: Not on file  . Transportation needs:    Medical: Not on file    Non-medical: Not on file  Tobacco Use  . Smoking status: Former Smoker    Packs/day: 4.00    Years: 15.00    Pack years: 60.00    Types: Cigarettes    Last attempt to quit: 04/22/2004    Years since quitting: 14.8  . Smokeless tobacco:  Never Used  . Tobacco comment: 4 pack year hx  Substance and Sexual Activity  . Alcohol use: No  . Drug use: No  . Sexual activity: Never  Lifestyle  . Physical activity:    Days per week: Not on file    Minutes per session: Not on file  . Stress: Not on file  Relationships  . Social connections:    Talks on phone: Not on file    Gets together: Not on file    Attends religious service: Not on file    Active member of club or organization: Not on file    Attends meetings of clubs or organizations: Not on file    Relationship status: Not on file  Other Topics Concern  . Not on file  Social History Narrative  . Not on file   Additional Social History:    Pain Medications: see MAR Prescriptions: see MAR Over the Counter: see MAR History of alcohol / drug use?: No history of alcohol / drug abuse Longest period of sobriety (when/how long): no drug/alcohol/tobacco use Negative Consequences of Use: Financial, Personal relationships Withdrawal Symptoms: Tremors  Sleep: improving  Appetite:  Good  Current Medications: Current Facility-Administered Medications  Medication Dose Route Frequency Provider Last Rate Last Dose  . acetaminophen (TYLENOL) tablet 650 mg  650 mg Oral Q6H PRN Sharma Covert, MD      . alum & mag hydroxide-simeth (MAALOX/MYLANTA) 200-200-20 MG/5ML suspension 30 mL  30 mL Oral Q4H PRN Sharma Covert, MD      . amantadine (SYMMETREL) capsule 100 mg  100 mg Oral Daily Artice Holohan, Myer Peer, MD   100 mg at 03/07/19 6659  . aspirin EC tablet 81 mg  81 mg Oral Daily Sharma Covert, MD   81 mg at 03/07/19 9357  . atorvastatin (LIPITOR) tablet 80 mg  80 mg Oral q1800 Sharma Covert, MD   80 mg at 03/06/19 1754  . DULoxetine (CYMBALTA) DR capsule 120 mg  120 mg Oral Daily Sharma Covert, MD   120 mg at 03/07/19 0177  . feeding supplement (ENSURE ENLIVE) (ENSURE ENLIVE) liquid 237 mL  237 mL Oral BID BM Sharma Covert, MD   237 mL at 03/05/19 1108   . hydrOXYzine (ATARAX/VISTARIL) tablet 25 mg  25 mg Oral TID PRN Sharma Covert, MD   25 mg at 03/06/19 2059  . irbesartan (AVAPRO) tablet 150 mg  150 mg Oral Daily Sharma Covert, MD   150 mg at 03/07/19 9390  . LORazepam (ATIVAN) tablet 1 mg  1 mg Oral TID Greogory Cornette A, MD      . magnesium hydroxide (MILK OF MAGNESIA) suspension 30 mL  30 mL Oral Daily PRN Sharma Covert, MD   30 mL at 03/05/19 1759  .  metFORMIN (GLUCOPHAGE-XR) 24 hr tablet 1,000 mg  1,000 mg Oral BID WC Sharma Covert, MD   1,000 mg at 03/07/19 0938  . mirtazapine (REMERON) tablet 7.5 mg  7.5 mg Oral QHS Sharma Covert, MD   7.5 mg at 03/06/19 2058  . multivitamin with minerals tablet 1 tablet  1 tablet Oral Daily Sharma Covert, MD   1 tablet at 03/07/19 1829  . nitrofurantoin (macrocrystal-monohydrate) (MACROBID) capsule 100 mg  100 mg Oral Q12H Sharma Covert, MD   100 mg at 03/07/19 9371  . OXcarbazepine (TRILEPTAL) tablet 150 mg  150 mg Oral BID Sharma Covert, MD   150 mg at 03/07/19 6967  . [START ON 03/08/2019] pantoprazole (PROTONIX) EC tablet 40 mg  40 mg Oral BH-q7a Lillieann Pavlich A, MD      . thiamine (VITAMIN B-1) tablet 100 mg  100 mg Oral Daily Sharma Covert, MD   100 mg at 03/07/19 8938    Lab Results:  Results for orders placed or performed during the hospital encounter of 03/04/19 (from the past 48 hour(s))  Glucose, capillary     Status: Abnormal   Collection Time: 03/05/19  5:20 PM  Result Value Ref Range   Glucose-Capillary 165 (H) 70 - 99 mg/dL  Glucose, capillary     Status: Abnormal   Collection Time: 03/05/19  8:09 PM  Result Value Ref Range   Glucose-Capillary 159 (H) 70 - 99 mg/dL   Comment 1 Notify RN   Glucose, capillary     Status: None   Collection Time: 03/06/19  6:13 AM  Result Value Ref Range   Glucose-Capillary 89 70 - 99 mg/dL  Glucose, capillary     Status: None   Collection Time: 03/06/19 12:04 PM  Result Value Ref Range    Glucose-Capillary 95 70 - 99 mg/dL   Comment 1 Notify RN    Comment 2 Document in Chart   Glucose, capillary     Status: Abnormal   Collection Time: 03/06/19  5:03 PM  Result Value Ref Range   Glucose-Capillary 143 (H) 70 - 99 mg/dL   Comment 1 Notify RN    Comment 2 Document in Chart   Glucose, capillary     Status: Abnormal   Collection Time: 03/06/19  8:02 PM  Result Value Ref Range   Glucose-Capillary 170 (H) 70 - 99 mg/dL   Comment 1 Notify RN   Glucose, capillary     Status: Abnormal   Collection Time: 03/07/19  5:46 AM  Result Value Ref Range   Glucose-Capillary 111 (H) 70 - 99 mg/dL  Glucose, capillary     Status: Abnormal   Collection Time: 03/07/19 12:32 PM  Result Value Ref Range   Glucose-Capillary 148 (H) 70 - 99 mg/dL   Comment 1 Notify RN     Blood Alcohol level:  Lab Results  Component Value Date   ETH <10 07/13/5101    Metabolic Disorder Labs: Lab Results  Component Value Date   HGBA1C 9.5 (A) 10/09/2018   MPG 180 (H) 04/03/2014   No results found for: PROLACTIN Lab Results  Component Value Date   CHOL 174 10/09/2018   TRIG 210 (H) 10/09/2018   HDL 44 10/09/2018   CHOLHDL 4.0 10/09/2018   LDLCALC 88 10/09/2018   LDLCALC 77 06/28/2018    Physical Findings: AIMS: Facial and Oral Movements Muscles of Facial Expression: None, normal Lips and Perioral Area: None, normal Jaw: None, normal Tongue: None, normal,Extremity Movements  Upper (arms, wrists, hands, fingers): None, normal Lower (legs, knees, ankles, toes): None, normal, Trunk Movements Neck, shoulders, hips: None, normal, Overall Severity Severity of abnormal movements (highest score from questions above): None, normal Incapacitation due to abnormal movements: None, normal Patient's awareness of abnormal movements (rate only patient's report): No Awareness, Dental Status Current problems with teeth and/or dentures?: No Does patient usually wear dentures?: No  CIWA:  CIWA-Ar Total:  0 COWS:  COWS Total Score: 3  Musculoskeletal: Strength & Muscle Tone: within normal limits- no significant tremors, no diaphoresis, no restlessness ,appears comfortable Gait & Station: normal Patient leans: N/A  Psychiatric Specialty Exam: Physical Exam  ROS denies headache, denies visual disturbances, no chest pain, no shortness of breath, (+) constipation, denies dysuria or urgency at this time,  no fever or chills   Blood pressure 115/69, pulse (!) 106, temperature 98.2 F (36.8 C), temperature source Oral, resp. rate 18, height 5' 4"  (1.626 m), weight 76.7 kg, SpO2 100 %.Body mass index is 29.01 kg/m.  General Appearance: improving grooming   Eye Contact:  Good  Speech:  Normal Rate  Volume:  Normal  Mood:  partially improved mood but overall still depressed , anxious  Affect:  intermittently tearful, anxious, improves partially during session  Thought Process:  Linear and Descriptions of Associations: Intact  Orientation:  Full (Time, Place, and Person)  Thought Content:  denies hallucinations,no delusions, not internally preoccupied   Suicidal Thoughts:  No denies suicidal or self injurious ideations, denies homicidal or violent ideations  Homicidal Thoughts:  No  Memory:  recent and remote grossly intact   Judgement:  Other:  fair/improving  Insight:  Fair  Psychomotor Activity:  Normal- no tremors or diaphoresis  Concentration:  Concentration: Good and Attention Span: Good  Recall:  Good  Fund of Knowledge:  Good  Language:  Good  Akathisia:  Negative  Handed:  Right  AIMS (if indicated):     Assets:  Desire for Improvement Resilience  ADL's:  Intact  Cognition:  WNL  Sleep:  Number of Hours: 6.25   Assessment : 63 year old female, history of depression, anxiety, PTSD ( related to son's death, who passed away from an overdose several years ago). Presented to ED reporting worsening depression, worsening anxiety, suicidal ideations. Patient reports BZD doses had been  increased recently, and was also taking more than prescribed at times, up to Ativan 7 mgrs daily prior to admission.  Patient is presenting with some improvement but still describes a sense of depression, anxiety, and presents intermittently tearful. She is tolerating medications well, and thus far has tolerated Ativan taper well . She is expressing insight regarding risks associated with long term/high dose BZD management and expresses high level of motivation in tapering off BZDs gradually at this time.   Treatment Plan Summary: Daily contact with patient to assess and evaluate symptoms and progress in treatment, Medication management, Plan inpatient treatment and medications as below  Treatment plan reviewed as below.  Continue Trileptal 150 mgrs BID for mood disorder Increase  Remeron to 15  mgrs QHS for insomnia , depression, anxiety Decrease Ativan to 1 mgr TID-  will taper gradually  Continue Metformin 1000 mgrs BID for DM  Continue ASA 81 mgrs QDAY  Continue Protonix 40 mgrs QDAY for GERD symptoms/gastric protection Continue Lipitor for hypercholesterolemia Continue Cymbalta at 120 mgrs QDAY for depression, anxiety Continue  Amantadine  100 mgrs QDAY  Treatment team working on disposition planning options  Jenne Campus, MD  03/07/2019, 12:56 PM   Patient ID: Jeanette Rose, female   DOB: [birth year], 63 y.o.   MRN: [id number]

## 2019-03-07 NOTE — Plan of Care (Addendum)
Patient self inventory- patient slept fair last night, sleep mediation was not requested. Appetite is good, energy level low, concentration poor. Depression, hopelessness, and anxiety rated all 8/10. Patient endorses passive SI (denied SI HI AVH in person). Patient's goal is "take meds, talk to doctor."  Patient is compliant with medications. No side effects noted. Safety- 15 minute checks.  Problem: Education: Goal: Knowledge of [place] [facility] Education information/materials will improve Outcome: Progressing Goal: Emotional status will improve Outcome: Progressing Goal: Mental status will improve Outcome: Progressing Goal: Verbalization of understanding the information provided will improve Outcome: Progressing

## 2019-03-07 NOTE — [facility] Group Notes (Signed)
Occupational Therapy Group Note  Date:  03/07/2019 Time:  11:32 AM  Group Topic/Focus:  Self Esteem Action Plan:   The focus of this group is to help patients create a plan to continue to build self-esteem after discharge.  Participation Level:  Active  Participation Quality:  Attentive  Affect:  Depressed, Flat and Tearful  Cognitive:  Appropriate  Insight: Improving  Engagement in Group:  Engaged  Modes of Intervention:  Activity, Discussion, Education and Socialization  Additional Comments:    S: "It is just a really bad day"  O:Education given on self esteem and how it relates to daily experiences. Pt asked to give definition of self esteem, and current rating of self esteem. Positive and negative contributors of self esteem to be brainstormed within group in relation to personal experiences. Positive thinking activity then completed for pt to identify several positive traits about themselves. Pt asked to share at end of session.   A: Pt presents to group with flat and depressed affect, many episodes of tearfulness as well. Pt states that today has been a bad day for her, and she is having difficulty controlling her crying. She is engaged in negative vs positive discussion. When given A-Z activity, pt states "I cannot think of any" provided cues and support and pt began to get tearful, unable to complete activity. She began to ask other patients "why can't I smile and be like you guys". Other group members offered support, pt continuing to be tearful. Alerted MSW for follow up after group if needed.  P: OT group will be x1 per week while pt inpatient.   Zenovia Jarred, MSOT, OTR/L Behavioral Health OT/ Acute Relief OT PHP Office: [place] 03/07/2019, 11:32 AM

## 2019-03-08 LAB — GLUCOSE, CAPILLARY: Glucose-Capillary: 132 mg/dL — ABNORMAL HIGH (ref 70–99)

## 2019-03-08 MED ORDER — LORAZEPAM 1 MG PO TABS
1.0000 mg | ORAL_TABLET | Freq: Four times a day (QID) | ORAL | Status: DC | PRN
  Administered 2019-03-09 – 2019-03-10 (×2): 1 mg via ORAL
  Filled 2019-03-08 (×2): qty 1

## 2019-03-08 MED ORDER — POLYETHYLENE GLYCOL 3350 17 G PO PACK: 17.0000 g | PACK | Freq: Every day | ORAL | Status: DC | PRN

## 2019-03-08 NOTE — Progress Notes (Signed)
Pt in dayroom upon initial approach, off to corner of room. Pt affect anxious, cooperative. Pt rated her day a "4" and her goal was to participate in group today. Pt denies SI/HI or hallucinations (a) 15 min checks (r) safety maintained.

## 2019-03-08 NOTE — Progress Notes (Signed)
Pt was on phone during initial approach. Pt states she had a hard day and rated it a "6" and had no daily goal. Pt states she was tearful with her sister on the phone. Pt denies SI/HI or hallucinations (a) 15 min checks (r) safety maintained.   [place] NOVEL CORONAVIRUS (COVID-19) DAILY CHECK-OFF SYMPTOMS - answer yes or no to each - every day NO YES  Have you had a fever in the past 24 hours?  . Fever (Temp > 37.80C / 100F) X   Have you had any of these symptoms in the past 24 hours? . New Cough .  Sore Throat  .  Shortness of Breath .  Difficulty Breathing .  Unexplained Body Aches   X   Have you had any one of these symptoms in the past 24 hours not related to allergies?   . Runny Nose .  Nasal Congestion .  Sneezing   X   If you have had runny nose, nasal congestion, sneezing in the past 24 hours, has it worsened?  X   EXPOSURES - check yes or no X   Have you traveled outside the state in the past 14 days?  X   Have you been in contact with someone with a confirmed diagnosis of COVID-19 or PUI in the past 14 days without wearing appropriate PPE?  X   Have you been living in the same home as a person with confirmed diagnosis of COVID-19 or a PUI (household contact)?    X   Have you been diagnosed with COVID-19?    X              What to do next: Answered NO to all: Answered YES to anything:   Proceed with unit schedule Follow the [facility] Inpatient Flowsheet.

## 2019-03-08 NOTE — Progress Notes (Addendum)
[facility] MD Progress Note  03/08/2019 12:27 PM Jeanette Rose  MRN:  [id number] Subjective:  "I want to get better but I don't feel like I'm going to."  Jeanette Rose observed standing in the hallway, crying on the phone. She continues to be very tearful and anxious on assessment, saying that she is worried she will still be struggling with the same levels of anxiety when she returns home. Patient reports she was sitting in her room and started breathing hard and crying, seemingly without a trigger. She called her sister and continued crying. Pulse on assessment is 104. She was taking up to 7 mg Ativan per day prior to admission and expressed desire to taper off. She has been tapered down to 3 mg/day of Ativan at this time. Denies physical withdrawal symptoms but she does report "I feel shaky inside." Trileptal was started 3 days ago. Patient denies SI/HI/AVH but continues to report severe anxiety and depression. Sleep has improved with Remeron. Denies history of manic symptoms or bipolar disorder diagnosis. She declines ECT consult at this time, states "I want to give the medication more time." Morning CBG 132. She reports constipation with last BM 2 nights ago.  From admission H&P: Patient is a 63 year old female with a past psychiatric history significant for posttraumatic stress disorder, generalized anxiety disorder, major depression and benzodiazepine dependence who presented to the [facility] urgency department on 03/04/2019 with suicidal ideation.  Principal Problem: <principal problem not specified> Diagnosis: Active Problems:   MDD (major depressive disorder), recurrent episode, severe ([place])  Total Time spent with patient: 30 minutes  Past Psychiatric History: See admission H&P  Past Medical History:  Past Medical History:  Diagnosis Date  . Acute acalculous cholecystitis s/p lap chole 04/03/2014 04/02/2014  . Allergy   . Anemia   . Anxiety   . Asthma   . Depression   . Fatty liver     noted on CT per [facility] records  . Fibromyalgia   . GERD (gastroesophageal reflux disease)   . Hyperlipidemia associated with type 2 diabetes mellitus ([place])   . Hypertension   . Hypothyroid 2015   Transiently in 2015. Synthroid 2mg was stopped 05/07/2016 w/ tsh nml following.  . Obesity (BMI 30-39.9)   . OSA (obstructive sleep apnea)    non-compliant with CPAP  . Seborrheic dermatitis    [facility]  . Stroke ([facility]    TIA  . Type 2 diabetes mellitus ([place]     Past Surgical History:  Procedure Laterality Date  . BREAST REDUCTION SURGERY  06/2008  . CHOLECYSTECTOMY N/A 04/03/2014   Procedure: LAPAROSCOPIC CHOLECYSTECTOMY ;  Surgeon: SAdin Hector MD;  Location: [facility] ORS;  Service: General;  Laterality: N/A;  . REDUCTION MAMMAPLASTY    . TUBAL LIGATION     Family History:  Family History  Problem Relation Age of Onset  . Atrial fibrillation Mother   . Heart disease Father        CAD  . Diabetes Brother   . Cancer Maternal Grandmother   . Diabetes Paternal Grandmother   . Fibromyalgia Sister   . Colitis Sister    Family Psychiatric  History: See admission H&P Social History:  Social History   Substance and Sexual Activity  Alcohol Use No     Social History   Substance and Sexual Activity  Drug Use No    Social History   Socioeconomic History  . Marital status: Divorced  Spouse name: Not on file  . Number of children: Not on file  . Years of education: Not on file  . Highest education level: Not on file  Occupational History  . Not on file  Social Needs  . Financial resource strain: Not on file  . Food insecurity    Worry: Not on file    Inability: Not on file  . Transportation needs    Medical: Not on file    Non-medical: Not on file  Tobacco Use  . Smoking status: Former Smoker    Packs/day: 4.00    Years: 15.00    Pack years: 60.00    Types: Cigarettes    Quit date: 04/22/2004    Years since quitting: 14.8  .  Smokeless tobacco: Never Used  . Tobacco comment: 4 pack year hx  Substance and Sexual Activity  . Alcohol use: No  . Drug use: No  . Sexual activity: Never  Lifestyle  . Physical activity    Days per week: Not on file    Minutes per session: Not on file  . Stress: Not on file  Relationships  . Social Herbalist on phone: Not on file    Gets together: Not on file    Attends religious service: Not on file    Active member of club or organization: Not on file    Attends meetings of clubs or organizations: Not on file    Relationship status: Not on file  Other Topics Concern  . Not on file  Social History Narrative  . Not on file   Additional Social History:    Pain Medications: see MAR Prescriptions: see MAR Over the Counter: see MAR History of alcohol / drug use?: No history of alcohol / drug abuse Longest period of sobriety (when/how long): no drug/alcohol/tobacco use Negative Consequences of Use: Financial, Personal relationships Withdrawal Symptoms: Tremors                    Sleep: Good  Appetite:  Fair  Current Medications: Current Facility-Administered Medications  Medication Dose Route Frequency Provider Last Rate Last Dose  . acetaminophen (TYLENOL) tablet 650 mg  650 mg Oral Q6H PRN Sharma Covert, MD      . alum & mag hydroxide-simeth (MAALOX/MYLANTA) 200-200-20 MG/5ML suspension 30 mL  30 mL Oral Q4H PRN Sharma Covert, MD      . amantadine (SYMMETREL) capsule 100 mg  100 mg Oral Daily Cobos, Myer Peer, MD   100 mg at 03/08/19 0929  . aspirin EC tablet 81 mg  81 mg Oral Daily Sharma Covert, MD   81 mg at 03/08/19 0929  . atorvastatin (LIPITOR) tablet 80 mg  80 mg Oral q1800 Sharma Covert, MD   80 mg at 03/07/19 1703  . DULoxetine (CYMBALTA) DR capsule 120 mg  120 mg Oral Daily Sharma Covert, MD   120 mg at 03/08/19 0930  . feeding supplement (ENSURE ENLIVE) (ENSURE ENLIVE) liquid 237 mL  237 mL Oral BID BM Sharma Covert, MD   237 mL at 03/05/19 1108  . hydrOXYzine (ATARAX/VISTARIL) tablet 25 mg  25 mg Oral TID PRN Sharma Covert, MD   25 mg at 03/06/19 2059  . irbesartan (AVAPRO) tablet 150 mg  150 mg Oral Daily Sharma Covert, MD   150 mg at 03/08/19 0929  . LORazepam (ATIVAN) tablet 1 mg  1 mg Oral TID Cobos, Myer Peer, MD  1 mg at 03/08/19 1217  . LORazepam (ATIVAN) tablet 1 mg  1 mg Oral Q6H PRN Cobos, Fernando A, MD      . magnesium hydroxide (MILK OF MAGNESIA) suspension 30 mL  30 mL Oral Daily PRN Sharma Covert, MD   30 mL at 03/05/19 1759  . metFORMIN (GLUCOPHAGE-XR) 24 hr tablet 1,000 mg  1,000 mg Oral BID WC Sharma Covert, MD   1,000 mg at 03/08/19 0929  . mirtazapine (REMERON) tablet 15 mg  15 mg Oral QHS Cobos, Myer Peer, MD   15 mg at 03/07/19 2100  . multivitamin with minerals tablet 1 tablet  1 tablet Oral Daily Sharma Covert, MD   1 tablet at 03/08/19 0929  . nitrofurantoin (macrocrystal-monohydrate) (MACROBID) capsule 100 mg  100 mg Oral Q12H Sharma Covert, MD   100 mg at 03/08/19 0930  . pantoprazole (PROTONIX) EC tablet 40 mg  40 mg Oral BH-q7a Cobos, Myer Peer, MD   40 mg at 03/08/19 0618  . thiamine (VITAMIN B-1) tablet 100 mg  100 mg Oral Daily Sharma Covert, MD   100 mg at 03/08/19 0930    Lab Results:  Results for orders placed or performed during the hospital encounter of 03/04/19 (from the past 48 hour(s))  Glucose, capillary     Status: Abnormal   Collection Time: 03/06/19  5:03 PM  Result Value Ref Range   Glucose-Capillary 143 (H) 70 - 99 mg/dL   Comment 1 Notify RN    Comment 2 Document in Chart   Glucose, capillary     Status: Abnormal   Collection Time: 03/06/19  8:02 PM  Result Value Ref Range   Glucose-Capillary 170 (H) 70 - 99 mg/dL   Comment 1 Notify RN   Glucose, capillary     Status: Abnormal   Collection Time: 03/07/19  5:46 AM  Result Value Ref Range   Glucose-Capillary 111 (H) 70 - 99 mg/dL  Glucose, capillary      Status: Abnormal   Collection Time: 03/07/19 12:32 PM  Result Value Ref Range   Glucose-Capillary 148 (H) 70 - 99 mg/dL   Comment 1 Notify RN   Glucose, capillary     Status: Abnormal   Collection Time: 03/08/19  6:44 AM  Result Value Ref Range   Glucose-Capillary 132 (H) 70 - 99 mg/dL    Blood Alcohol level:  Lab Results  Component Value Date   ETH <10 31/54/0086    Metabolic Disorder Labs: Lab Results  Component Value Date   HGBA1C 9.5 (A) 10/09/2018   MPG 180 (H) 04/03/2014   No results found for: PROLACTIN Lab Results  Component Value Date   CHOL 174 10/09/2018   TRIG 210 (H) 10/09/2018   HDL 44 10/09/2018   CHOLHDL 4.0 10/09/2018   LDLCALC 88 10/09/2018   LDLCALC 77 06/28/2018    Physical Findings: AIMS: Facial and Oral Movements Muscles of Facial Expression: None, normal Lips and Perioral Area: None, normal Jaw: None, normal Tongue: None, normal,Extremity Movements Upper (arms, wrists, hands, fingers): None, normal Lower (legs, knees, ankles, toes): None, normal, Trunk Movements Neck, shoulders, hips: None, normal, Overall Severity Severity of abnormal movements (highest score from questions above): None, normal Incapacitation due to abnormal movements: None, normal Patient's awareness of abnormal movements (rate only patient's report): No Awareness, Dental Status Current problems with teeth and/or dentures?: No Does patient usually wear dentures?: No  CIWA:  CIWA-Ar Total: 0 COWS:  COWS Total Score: 1  Musculoskeletal: Strength & Muscle Tone: within normal limits Gait & Station: normal Patient leans: N/A  Psychiatric Specialty Exam: Physical Exam  Nursing note and vitals reviewed. Constitutional: She is oriented to person, place, and time. She appears well-developed and well-nourished.  Cardiovascular: Normal rate.  Respiratory: Effort normal.  Neurological: She is alert and oriented to person, place, and time.    Review of Systems   Constitutional: Negative.  Negative for diaphoresis.  Respiratory: Negative for cough and shortness of breath.   Cardiovascular: Negative for chest pain.  Gastrointestinal: Positive for constipation (last BM 2 days ago). Negative for abdominal pain, heartburn, nausea and vomiting.  Neurological: Negative for tremors, sensory change and headaches.  Psychiatric/Behavioral: Positive for depression. Negative for hallucinations, substance abuse and suicidal ideas. The patient is nervous/anxious. The patient does not have insomnia.     Blood pressure 118/70, pulse (!) 114, temperature 98.2 F (36.8 C), resp. rate 16, height 5' 4"  (1.626 m), weight 76.7 kg, SpO2 98 %.Body mass index is 29.01 kg/m.  General Appearance: Fairly Groomed  Eye Contact:  Fair  Speech:  Pressured  Volume:  Normal  Mood:  Anxious and Depressed  Affect:  Tearful and Anxious  Thought Process:  Coherent  Orientation:  Full (Time, Place, and Person)  Thought Content:  Rumination  Suicidal Thoughts:  Denies  Homicidal Thoughts:  Denies  Memory:  Immediate;   Fair Recent;   Fair  Judgement:  Intact  Insight:  Fair  Psychomotor Activity:  Normal  Concentration:  Concentration: Fair and Attention Span: Fair  Recall:  [employer] of Knowledge:  Fair  Language:  Good  Akathisia:  No  Handed:  Right  AIMS (if indicated):     Assets:  Communication Skills Desire for Improvement Financial Resources/Insurance Housing  ADL's:  Intact  Cognition:  WNL  Sleep:  Number of Hours: 6.25     Treatment Plan Summary: Daily contact with patient to assess and evaluate symptoms and progress in treatment and Medication management   Continue inpatient hospitalization.  Continue Ativan 1 mg PO TID for anxiety Add Ativan 1 mg PO Q6HR PRN withdrawal symptoms Discontinue Trileptal Start Miralax daily PRN constipation Continue Cymbalta 120 mg PO daily for depression/anxiety Continue Vistaril 25 mg PO TID PRN anxiety Continue  Remeron 15 mg PO QHS for mood/sleep Continue amantadine 100 mg PO daily for tremor Continue ASA 81 mg PO daily for antiplatelet Continue Lipitor 80 mg PO daily for HLD Continue Protonix 40 mg PO daily for GERD Continue thiamine 100 mg PO daily for supplementation Continue Macrobid 100 mg PO Q12HR for UTI Continue metformin 1000 mg PO BID for diabetes Continue Avapro 150 mg PO daily for HTN  Patient will participate in the therapeutic group milieu.  Discharge disposition in progress.   Connye Burkitt, NP 03/08/2019, 12:27 PM   Agree with NP progress note

## 2019-03-08 NOTE — Progress Notes (Signed)
[place] NOVEL CORONAVIRUS (COVID-19) DAILY CHECK-OFF SYMPTOMS - answer yes or no to each - every day NO YES  Have you had a fever in the past 24 hours?  . Fever (Temp > 37.80C / 100F) X   Have you had any of these symptoms in the past 24 hours? . New Cough .  Sore Throat  .  Shortness of Breath .  Difficulty Breathing .  Unexplained Body Aches   X   Have you had any one of these symptoms in the past 24 hours not related to allergies?   . Runny Nose .  Nasal Congestion .  Sneezing   X   If you have had runny nose, nasal congestion, sneezing in the past 24 hours, has it worsened?  X   EXPOSURES - check yes or no X   Have you traveled outside the state in the past 14 days?  X   Have you been in contact with someone with a confirmed diagnosis of COVID-19 or PUI in the past 14 days without wearing appropriate PPE?  X   Have you been living in the same home as a person with confirmed diagnosis of COVID-19 or a PUI (household contact)?    X   Have you been diagnosed with COVID-19?    X              What to do next: Answered NO to all: Answered YES to anything:   Proceed with unit schedule Follow the [facility] Inpatient Flowsheet.   

## 2019-03-09 LAB — HEMOGLOBIN A1C
Hgb A1c MFr Bld: 6.3 % — ABNORMAL HIGH (ref 4.8–5.6)
Mean Plasma Glucose: 134.11 mg/dL

## 2019-03-09 LAB — GLUCOSE, CAPILLARY: Glucose-Capillary: 115 mg/dL — ABNORMAL HIGH (ref 70–99)

## 2019-03-09 MED ORDER — LORAZEPAM 1 MG PO TABS: 1.0000 mg | ORAL_TABLET | Freq: Once | ORAL | Status: DC

## 2019-03-09 MED ORDER — LORAZEPAM 1 MG PO TABS
2.0000 mg | ORAL_TABLET | Freq: Two times a day (BID) | ORAL | Status: DC
  Administered 2019-03-09 – 2019-03-10 (×2): 2 mg via ORAL
  Administered 2019-03-10: 1 mg via ORAL
  Administered 2019-03-11: 08:00:00 2 mg via ORAL
  Filled 2019-03-09: qty 2
  Filled 2019-03-09: qty 1
  Filled 2019-03-09 (×2): qty 2

## 2019-03-09 MED ORDER — OXCARBAZEPINE 150 MG PO TABS
150.0000 mg | ORAL_TABLET | Freq: Two times a day (BID) | ORAL | Status: DC
  Administered 2019-03-09 – 2019-03-11 (×4): 150 mg via ORAL
  Filled 2019-03-09 (×8): qty 1

## 2019-03-09 NOTE — Tx Team (Signed)
Interdisciplinary Treatment and Diagnostic Plan Update  03/09/2019 Time of Session:  Jeanette Rose MRN: [id number]  Principal Diagnosis: <principal problem not specified>  Secondary Diagnoses: Active Problems:   MDD (major depressive disorder), recurrent episode, severe (HCC)   Current Medications:  Current Facility-Administered Medications  Medication Dose Route Frequency Provider Last Rate Last Dose  . acetaminophen (TYLENOL) tablet 650 mg  650 mg Oral Q6H PRN Sharma Covert, MD      . alum & mag hydroxide-simeth (MAALOX/MYLANTA) 200-200-20 MG/5ML suspension 30 mL  30 mL Oral Q4H PRN Sharma Covert, MD      . amantadine (SYMMETREL) capsule 100 mg  100 mg Oral Daily Cobos, Myer Peer, MD   100 mg at 03/09/19 0810  . aspirin EC tablet 81 mg  81 mg Oral Daily Sharma Covert, MD   81 mg at 03/09/19 0810  . atorvastatin (LIPITOR) tablet 80 mg  80 mg Oral q1800 Sharma Covert, MD   80 mg at 03/08/19 1754  . DULoxetine (CYMBALTA) DR capsule 120 mg  120 mg Oral Daily Sharma Covert, MD   120 mg at 03/09/19 0810  . feeding supplement (ENSURE ENLIVE) (ENSURE ENLIVE) liquid 237 mL  237 mL Oral BID BM Sharma Covert, MD   237 mL at 03/05/19 1108  . hydrOXYzine (ATARAX/VISTARIL) tablet 25 mg  25 mg Oral TID PRN Sharma Covert, MD   25 mg at 03/06/19 2059  . irbesartan (AVAPRO) tablet 150 mg  150 mg Oral Daily Sharma Covert, MD   150 mg at 03/09/19 0810  . LORazepam (ATIVAN) tablet 1 mg  1 mg Oral Q6H PRN Cobos, Myer Peer, MD   1 mg at 03/09/19 1008  . LORazepam (ATIVAN) tablet 1 mg  1 mg Oral Once Cobos, Myer Peer, MD      . LORazepam (ATIVAN) tablet 2 mg  2 mg Oral BID Cobos, Fernando A, MD      . magnesium hydroxide (MILK OF MAGNESIA) suspension 30 mL  30 mL Oral Daily PRN Sharma Covert, MD   30 mL at 03/05/19 1759  . metFORMIN (GLUCOPHAGE-XR) 24 hr tablet 1,000 mg  1,000 mg Oral BID WC Sharma Covert, MD   1,000 mg at 03/09/19 0810  . mirtazapine  (REMERON) tablet 15 mg  15 mg Oral QHS Cobos, Myer Peer, MD   15 mg at 03/08/19 2110  . multivitamin with minerals tablet 1 tablet  1 tablet Oral Daily Sharma Covert, MD   1 tablet at 03/09/19 0810  . nitrofurantoin (macrocrystal-monohydrate) (MACROBID) capsule 100 mg  100 mg Oral Q12H Sharma Covert, MD   100 mg at 03/09/19 0810  . pantoprazole (PROTONIX) EC tablet 40 mg  40 mg Oral [id number]-[id number] Cobos, Myer Peer, MD   40 mg at 03/09/19 5797  . polyethylene glycol (MIRALAX / GLYCOLAX) packet 17 g  17 g Oral Daily PRN Connye Burkitt, NP      . thiamine (VITAMIN B-1) tablet 100 mg  100 mg Oral Daily Sharma Covert, MD   100 mg at 03/09/19 0810   PTA Medications: Medications Prior to Admission  Medication Sig Dispense Refill Last Dose  . amantadine (SYMMETREL) 100 MG capsule Take 100 mg by mouth See admin instructions. Take 100 mg by mouth in the morning and 100 mg at 5:30 PM daily     . aspirin (ASPIRIN LOW DOSE) 81 MG tablet Take 81 mg by mouth daily.      Marland Kitchen  atorvastatin (LIPITOR) 80 MG tablet TAKE 1 TABLET (80 MG TOTAL) BY MOUTH DAILY AT 6 PM. 90 tablet 0   . Cholecalciferol (VITAMIN D-3 PO) Take 1 capsule by mouth daily.     . Cyanocobalamin (VITAMIN B-12 CR PO) Take 1 tablet by mouth daily.     . Dulaglutide (TRULICITY) 1.5 HE/1.7EY SOPN INJECT 1.5 MG INTO THE SKIN ONCE A WEEK. (Patient taking differently: Inject 1.5 mg into the skin every 7 (seven) days. ) 4 pen 4   . DULoxetine (CYMBALTA) 60 MG capsule Take 120 mg by mouth every morning.      . Insulin Glargine, 2 Unit Dial, (TOUJEO MAX SOLOSTAR) 300 UNIT/ML SOPN Inject 72 Units into the skin daily before breakfast.      . Lancets (FREESTYLE) lancets 1 Units by Does not apply route 4 (four) times daily.     Marland Kitchen linaclotide (LINZESS) 145 MCG CAPS capsule Take 1 capsule (145 mcg total) by mouth daily before breakfast. **NEED GI VISIT FOR ANY ADDITIONAL REFILLS** 30 capsule 0   . LORazepam (ATIVAN) 1 MG tablet Take 1-2 mg by mouth See  admin instructions. Take 1 mg by mouth at 9:00 AM, 1 mg at 1:30 PM, 1 mg at 5:30 PM, and 2 mg at 10:15 PM for 1 week (beginning on 03/01/2019.) Take 1 mg by mouth at 9:00 AM, 1 mg at 1:30 PM, 1 mg at 5:30 PM, and 1 mg at 10:15 PM (week #2.) Further titration to be determined by Dr. Toy Care.     . metFORMIN (GLUCOPHAGE XR) 500 MG 24 hr tablet Take 2 tablets (1,000 mg total) by mouth 2 (two) times daily with a meal. 120 tablet 11   . [EXPIRED] nitrofurantoin, macrocrystal-monohydrate, (MACROBID) 100 MG capsule Take 100 mg by mouth 2 (two) times a day. FOR 5 DAYS     . olmesartan (BENICAR) 20 MG tablet TAKE 1 TABLET BY MOUTH EVERY DAY (Patient taking differently: Take 20 mg by mouth daily. ) 90 tablet 0   . omeprazole (PRILOSEC) 40 MG capsule TAKE 1 CAPSULE (40 MG TOTAL) BY MOUTH DAILY. 30 MINUTES BEFORE A MEAL. (Patient taking differently: Take 40 mg by mouth daily before breakfast. ) 90 capsule 0   . polyethylene glycol powder (GLYCOLAX/MIRALAX) powder Take 15-30 g by mouth daily. (Patient taking differently: Take 17 g by mouth daily as needed for mild constipation (when not taking Linzess). ) 500 g 5   . Vitamin D, Ergocalciferol, (DRISDOL) 50000 units CAPS capsule TAKE 1 CAPSULE (50,000 UNITS TOTAL) BY MOUTH EVERY 7 (SEVEN) DAYS. (Patient not taking: Reported on 03/03/2019) 24 capsule 0     Patient Stressors: Financial difficulties Marital or family conflict Medication change or noncompliance Occupational concerns Substance abuse Traumatic event  Patient Strengths: Ability for insight Average or above average intelligence Capable of independent living Curator fund of knowledge Motivation for treatment/growth Supportive family/friends  Treatment Modalities: Medication Management, Group therapy, Case management,  1 to 1 session with clinician, Psychoeducation, Recreational therapy.   Physician Treatment Plan for Primary Diagnosis: <principal problem not specified> Long  Term Goal(s): Improvement in symptoms so as ready for discharge Improvement in symptoms so as ready for discharge   Short Term Goals: Ability to identify changes in lifestyle to reduce recurrence of condition will improve Ability to verbalize feelings will improve Ability to disclose and discuss suicidal ideas Ability to demonstrate self-control will improve Ability to identify and develop effective coping behaviors will improve Ability to maintain clinical measurements within  normal limits will improve Ability to identify changes in lifestyle to reduce recurrence of condition will improve Ability to verbalize feelings will improve Ability to disclose and discuss suicidal ideas Ability to demonstrate self-control will improve Ability to identify and develop effective coping behaviors will improve Ability to maintain clinical measurements within normal limits will improve  Medication Management: Evaluate patient's response, side effects, and tolerance of medication regimen.  Therapeutic Interventions: 1 to 1 sessions, Unit Group sessions and Medication administration.  Evaluation of Outcomes: Not Met  Physician Treatment Plan for Secondary Diagnosis: Active Problems:   MDD (major depressive disorder), recurrent episode, severe ([place])  Long Term Goal(s): Improvement in symptoms so as ready for discharge Improvement in symptoms so as ready for discharge   Short Term Goals: Ability to identify changes in lifestyle to reduce recurrence of condition will improve Ability to verbalize feelings will improve Ability to disclose and discuss suicidal ideas Ability to demonstrate self-control will improve Ability to identify and develop effective coping behaviors will improve Ability to maintain clinical measurements within normal limits will improve Ability to identify changes in lifestyle to reduce recurrence of condition will improve Ability to verbalize feelings will improve Ability to  disclose and discuss suicidal ideas Ability to demonstrate self-control will improve Ability to identify and develop effective coping behaviors will improve Ability to maintain clinical measurements within normal limits will improve     Medication Management: Evaluate patient's response, side effects, and tolerance of medication regimen.  Therapeutic Interventions: 1 to 1 sessions, Unit Group sessions and Medication administration.  Evaluation of Outcomes: Not Met   RN Treatment Plan for Primary Diagnosis: <principal problem not specified> Long Term Goal(s): Knowledge of disease and therapeutic regimen to maintain health will improve  Short Term Goals: Ability to participate in decision making will improve, Ability to verbalize feelings will improve, Ability to disclose and discuss suicidal ideas and Ability to identify and develop effective coping behaviors will improve  Medication Management: RN will administer medications as ordered by provider, will assess and evaluate patient's response and provide education to patient for prescribed medication. RN will report any adverse and/or side effects to prescribing provider.  Therapeutic Interventions: 1 on 1 counseling sessions, Psychoeducation, Medication administration, Evaluate responses to treatment, Monitor vital signs and CBGs as ordered, Perform/monitor CIWA, COWS, AIMS and Fall Risk screenings as ordered, Perform wound care treatments as ordered.  Evaluation of Outcomes: Not Met   LCSW Treatment Plan for Primary Diagnosis: <principal problem not specified> Long Term Goal(s): Safe transition to appropriate next level of care at discharge, Engage patient in therapeutic group addressing interpersonal concerns.  Short Term Goals: Engage patient in aftercare planning with referrals and resources  Therapeutic Interventions: Assess for all discharge needs, 1 to 1 time with Social worker, Explore available resources and support systems,  Assess for adequacy in community support network, Educate family and significant other(s) on suicide prevention, Complete Psychosocial Assessment, Interpersonal group therapy.  Evaluation of Outcomes: Not Met   Progress in Treatment: Attending groups: Yes. Participating in groups: Yes. Taking medication as prescribed: Yes. Toleration medication: Yes. Family/Significant other contact made: No, will contact:  attempted to contact patient's sister  Patient understands diagnosis: Yes. Discussing patient identified problems/goals with staff: Yes. Medical problems stabilized or resolved: Yes. Denies suicidal/homicidal ideation: Yes. Issues/concerns per patient self-inventory: No. Other:   New problem(s) identified: None  New Short Term/Long Term Goal(s): medication stabilization, elimination of SI thoughts, development of comprehensive mental wellness plan.    Patient Goals:  Discharge Plan or Barriers: Patient lives alone. She will follow up with Dr. Toy Care for medication management services. Patient has been recommended for possible ECT services with Dr. Weber Cooks, MD for continuity of care. CSW will continue to follow.   Reason for Continuation of Hospitalization: Anxiety Depression Medication stabilization Suicidal ideation  Estimated Length of Stay: 3-5 days   Attendees: Patient: 03/09/2019 10:16 AM  Physician: Dr. Myles Lipps, MD 03/09/2019 10:16 AM  Nursing: Estill Bamberg.Loletha Grayer, RN 03/09/2019 10:16 AM  RN Care Manager: 03/09/2019 10:16 AM  Social Worker: [name], [place] 03/09/2019 10:16 AM  Recreational Therapist:  03/09/2019 10:16 AM  Other:  03/09/2019 10:16 AM  Other:  03/09/2019 10:16 AM  Other: 03/09/2019 10:16 AM    Scribe for Treatment Team: Marylee Floras, [place] 03/09/2019 10:16 AM

## 2019-03-09 NOTE — Progress Notes (Signed)
CSW referred the patient to Dr. Weber Cooks, MD at [facility] for possible ECT services.   According to Dr. Weber Cooks he will review the patient's chart to determine if she meets criteria, however he does not know when a bed will be available. He reports a bed could become available next week, however he is unsure.   CSW will continue to follow.  [name], MSW, [place] Worker [facility]  Phone: [phone]

## 2019-03-09 NOTE — Progress Notes (Signed)
Patient ID: Jeanette Rose, female   DOB: 10-26-1955, 63 y.o.   MRN: [id number]  [place] NOVEL CORONAVIRUS (COVID-19) DAILY CHECK-OFF SYMPTOMS - answer yes or no to each - every day NO YES  Have you had a fever in the past 24 hours?  . Fever (Temp > 37.80C / 100F) X   Have you had any of these symptoms in the past 24 hours? . New Cough .  Sore Throat  .  Shortness of Breath .  Difficulty Breathing .  Unexplained Body Aches   X   Have you had any one of these symptoms in the past 24 hours not related to allergies?   . Runny Nose .  Nasal Congestion .  Sneezing   X   If you have had runny nose, nasal congestion, sneezing in the past 24 hours, has it worsened?  X   EXPOSURES - check yes or no X   Have you traveled outside the state in the past 14 days?  X   Have you been in contact with someone with a confirmed diagnosis of COVID-19 or PUI in the past 14 days without wearing appropriate PPE?  X   Have you been living in the same home as a person with confirmed diagnosis of COVID-19 or a PUI (household contact)?    X   Have you been diagnosed with COVID-19?    X              What to do next: Answered NO to all: Answered YES to anything:   Proceed with unit schedule Follow the [facility] Inpatient Flowsheet.

## 2019-03-09 NOTE — Progress Notes (Signed)
[facility] MD Progress Note  03/09/2019 11:02 AM Jeanette Rose  MRN:  [id number] Subjective: Patient reports lingering/persistent anxiety, depression, states she is crying easily, sometimes for no apparent reason.  Denies suicidal ideations.  Denies medication side effects.  Objective : I have discussed case with treatment team and have met with patient. 63 year old female, history of depression, anxiety, PTSD ( related to son's death, who passed away from an overdose several years ago). Presented to ED reporting worsening depression, worsening anxiety, suicidal ideations. Patient reports BZD doses had been increased recently, and was also taking more than prescribed at times, up to Ativan 7 mgrs daily prior to admission.  Patient presents depressed, vaguely anxious, intermittently tearful.  Affect does improve partially during session with support. Denies suicidal ideations.  No psychotic symptoms.  Visible in dayroom, interacting with selected peers.  No disruptive or agitated behaviors.  Subtle distal tremors, no diaphoresis, blood pressure 125/76, pulse 113, no fever.  CBG today 115, hemoglobin A1c 6.3.   Principal Problem:  MDD, BZD Dependence Diagnosis: Active Problems:   MDD (major depressive disorder), recurrent episode, severe ([place])  Total Time spent with patient: 15 minutes  Past Psychiatric History:   Past Medical History:  Past Medical History:  Diagnosis Date  . Acute acalculous cholecystitis s/p lap chole 04/03/2014 04/02/2014  . Allergy   . Anemia   . Anxiety   . Asthma   . Depression   . Fatty liver    noted on CT per [facility] records  . Fibromyalgia   . GERD (gastroesophageal reflux disease)   . Hyperlipidemia associated with type 2 diabetes mellitus ([place])   . Hypertension   . Hypothyroid 2015   Transiently in 2015. Synthroid 7mg was stopped 05/07/2016 w/ tsh nml following.  . Obesity (BMI 30-39.9)   . OSA (obstructive sleep apnea)    non-compliant with CPAP  . Seborrheic  dermatitis    [facility]  . Stroke ([facility]    TIA  . Type 2 diabetes mellitus ([place]     Past Surgical History:  Procedure Laterality Date  . BREAST REDUCTION SURGERY  06/2008  . CHOLECYSTECTOMY N/A 04/03/2014   Procedure: LAPAROSCOPIC CHOLECYSTECTOMY ;  Surgeon: SAdin Hector MD;  Location: [facility] ORS;  Service: General;  Laterality: N/A;  . REDUCTION MAMMAPLASTY    . TUBAL LIGATION     Family History:  Family History  Problem Relation Age of Onset  . Atrial fibrillation Mother   . Heart disease Father        CAD  . Diabetes Brother   . Cancer Maternal Grandmother   . Diabetes Paternal Grandmother   . Fibromyalgia Sister   . Colitis Sister    Family Psychiatric  History:  Social History:  Social History   Substance and Sexual Activity  Alcohol Use No     Social History   Substance and Sexual Activity  Drug Use No    Social History   Socioeconomic History  . Marital status: Divorced    Spouse name: Not on file  . Number of children: Not on file  . Years of education: Not on file  . Highest education level: Not on file  Occupational History  . Not on file  Social Needs  . Financial resource strain: Not on file  . Food insecurity    Worry: Not on file    Inability: Not on file  . Transportation needs    Medical: Not on file  Non-medical: Not on file  Tobacco Use  . Smoking status: Former Smoker    Packs/day: 4.00    Years: 15.00    Pack years: 60.00    Types: Cigarettes    Quit date: 04/22/2004    Years since quitting: 14.8  . Smokeless tobacco: Never Used  . Tobacco comment: 4 pack year hx  Substance and Sexual Activity  . Alcohol use: No  . Drug use: No  . Sexual activity: Never  Lifestyle  . Physical activity    Days per week: Not on file    Minutes per session: Not on file  . Stress: Not on file  Relationships  . Social Herbalist on phone: Not on file    Gets together: Not on file    Attends  religious service: Not on file    Active member of club or organization: Not on file    Attends meetings of clubs or organizations: Not on file    Relationship status: Not on file  Other Topics Concern  . Not on file  Social History Narrative  . Not on file   Additional Social History:    Pain Medications: see MAR Prescriptions: see MAR Over the Counter: see MAR History of alcohol / drug use?: No history of alcohol / drug abuse Longest period of sobriety (when/how long): no drug/alcohol/tobacco use Negative Consequences of Use: Financial, Personal relationships Withdrawal Symptoms: Tremors  Sleep: Fair  Appetite:  Good  Current Medications: Current Facility-Administered Medications  Medication Dose Route Frequency Provider Last Rate Last Dose  . acetaminophen (TYLENOL) tablet 650 mg  650 mg Oral Q6H PRN Sharma Covert, MD      . alum & mag hydroxide-simeth (MAALOX/MYLANTA) 200-200-20 MG/5ML suspension 30 mL  30 mL Oral Q4H PRN Sharma Covert, MD      . amantadine (SYMMETREL) capsule 100 mg  100 mg Oral Daily Obelia Bonello, Myer Peer, MD   100 mg at 03/09/19 0810  . aspirin EC tablet 81 mg  81 mg Oral Daily Sharma Covert, MD   81 mg at 03/09/19 0810  . atorvastatin (LIPITOR) tablet 80 mg  80 mg Oral q1800 Sharma Covert, MD   80 mg at 03/08/19 1754  . DULoxetine (CYMBALTA) DR capsule 120 mg  120 mg Oral Daily Sharma Covert, MD   120 mg at 03/09/19 0810  . feeding supplement (ENSURE ENLIVE) (ENSURE ENLIVE) liquid 237 mL  237 mL Oral BID BM Sharma Covert, MD   237 mL at 03/05/19 1108  . hydrOXYzine (ATARAX/VISTARIL) tablet 25 mg  25 mg Oral TID PRN Sharma Covert, MD   25 mg at 03/06/19 2059  . irbesartan (AVAPRO) tablet 150 mg  150 mg Oral Daily Sharma Covert, MD   150 mg at 03/09/19 0810  . LORazepam (ATIVAN) tablet 1 mg  1 mg Oral Q6H PRN Hansika Leaming, Myer Peer, MD   1 mg at 03/09/19 1008  . LORazepam (ATIVAN) tablet 1 mg  1 mg Oral Once Kristol Almanzar, Myer Peer,  MD      . LORazepam (ATIVAN) tablet 2 mg  2 mg Oral BID Dashonna Chagnon A, MD      . magnesium hydroxide (MILK OF MAGNESIA) suspension 30 mL  30 mL Oral Daily PRN Sharma Covert, MD   30 mL at 03/05/19 1759  . metFORMIN (GLUCOPHAGE-XR) 24 hr tablet 1,000 mg  1,000 mg Oral BID WC Sharma Covert, MD   1,000 mg at  03/09/19 0810  . mirtazapine (REMERON) tablet 15 mg  15 mg Oral QHS Wynona Duhamel, Myer Peer, MD   15 mg at 03/08/19 2110  . multivitamin with minerals tablet 1 tablet  1 tablet Oral Daily Sharma Covert, MD   1 tablet at 03/09/19 0810  . nitrofurantoin (macrocrystal-monohydrate) (MACROBID) capsule 100 mg  100 mg Oral Q12H Sharma Covert, MD   100 mg at 03/09/19 0810  . pantoprazole (PROTONIX) EC tablet 40 mg  40 mg Oral BH-q7a Amarie Tarte, Myer Peer, MD   40 mg at 03/09/19 4696  . polyethylene glycol (MIRALAX / GLYCOLAX) packet 17 g  17 g Oral Daily PRN Connye Burkitt, NP      . thiamine (VITAMIN B-1) tablet 100 mg  100 mg Oral Daily Sharma Covert, MD   100 mg at 03/09/19 0810    Lab Results:  Results for orders placed or performed during the hospital encounter of 03/04/19 (from the past 48 hour(s))  Glucose, capillary     Status: Abnormal   Collection Time: 03/07/19 12:32 PM  Result Value Ref Range   Glucose-Capillary 148 (H) 70 - 99 mg/dL   Comment 1 Notify RN   Glucose, capillary     Status: Abnormal   Collection Time: 03/08/19  6:44 AM  Result Value Ref Range   Glucose-Capillary 132 (H) 70 - 99 mg/dL  Glucose, capillary     Status: Abnormal   Collection Time: 03/09/19  5:54 AM  Result Value Ref Range   Glucose-Capillary 115 (H) 70 - 99 mg/dL  Hemoglobin A1c     Status: Abnormal   Collection Time: 03/09/19  6:48 AM  Result Value Ref Range   Hgb A1c MFr Bld 6.3 (H) 4.8 - 5.6 %    Comment: (NOTE) Pre diabetes:          5.7%-6.4% Diabetes:              >6.4% Glycemic control for   <7.0% adults with diabetes    Mean Plasma Glucose 134.11 mg/dL    Comment:  Performed at [facility], [place] [address] [place], [place] [address]    Blood Alcohol level:  Lab Results  Component Value Date   ETH <10 41/32/4401    Metabolic Disorder Labs: Lab Results  Component Value Date   HGBA1C 6.3 (H) 03/09/2019   MPG 134.11 03/09/2019   MPG 180 (H) 04/03/2014   No results found for: PROLACTIN Lab Results  Component Value Date   CHOL 174 10/09/2018   TRIG 210 (H) 10/09/2018   HDL 44 10/09/2018   CHOLHDL 4.0 10/09/2018   LDLCALC 88 10/09/2018   LDLCALC 77 06/28/2018    Physical Findings: AIMS: Facial and Oral Movements Muscles of Facial Expression: None, normal Lips and Perioral Area: None, normal Jaw: None, normal Tongue: None, normal,Extremity Movements Upper (arms, wrists, hands, fingers): None, normal Lower (legs, knees, ankles, toes): None, normal, Trunk Movements Neck, shoulders, hips: None, normal, Overall Severity Severity of abnormal movements (highest score from questions above): None, normal Incapacitation due to abnormal movements: None, normal Patient's awareness of abnormal movements (rate only patient's report): No Awareness, Dental Status Current problems with teeth and/or dentures?: No Does patient usually wear dentures?: No  CIWA:  CIWA-Ar Total: 0 COWS:  COWS Total Score: 1  Musculoskeletal: Strength & Muscle Tone: within normal limits- no significant tremors, no diaphoresis, no restlessness ,appears comfortable Gait & Station: normal Patient leans: N/A  Psychiatric Specialty Exam: Physical Exam  ROS  denies headache, denies visual disturbances, no chest pain, no shortness of breath,  denies dysuria or urgency at this time,  no fever or chills   Blood pressure 125/76, pulse (!) 113, temperature 98.4 F (36.9 C), temperature source Oral, resp. rate 16, height 5' 4"  (1.626 m), weight 76.7 kg, SpO2 96 %.Body mass index is 29.01 kg/m.  General Appearance: improving grooming   Eye Contact:  Good  Speech:  Normal  Rate  Volume:  Normal  Mood:  Depressed and Intermittently tearful  Affect:  Labile, tearful at times  Thought Process:  Linear and Descriptions of Associations: Intact  Orientation:  Full (Time, Place, and Person)  Thought Content:  denies hallucinations,no delusions, not internally preoccupied   Suicidal Thoughts:  No denies suicidal or self injurious ideations, denies homicidal or violent ideations  Homicidal Thoughts:  No  Memory:  recent and remote grossly intact   Judgement:  Other:  fair/improving  Insight:  Fair  Psychomotor Activity:  Normal- no tremors or diaphoresis  Concentration:  Concentration: Good and Attention Span: Good  Recall:  Good  Fund of Knowledge:  Good  Language:  Good  Akathisia:  Negative  Handed:  Right  AIMS (if indicated):     Assets:  Desire for Improvement Resilience  ADL's:  Intact  Cognition:  WNL  Sleep:  Number of Hours: 6.25   Assessment : 63 year old female, history of depression, anxiety, PTSD ( related to son's death, who passed away from an overdose several years ago). Presented to ED reporting worsening depression, worsening anxiety, suicidal ideations. Patient reports BZD doses had been increased recently, and was also taking more than prescribed at times, up to Ativan 7 mgrs daily prior to admission.  Patient reports lingering/persistent depression, anxiety.  Presents intermittently tearful, vaguely anxious, with labile affect.  Denies suicidal ideations.  Mildly tachycardic, but in no acute distress.  She may be experiencing some benzodiazepine withdrawal symptoms that may be contributing to affective lability.  She remains motivated in tapering off Ativan gradually.  At this time will increase dose back to 2 mg twice daily to minimize withdrawal symptoms and proceed with slow taper as tolerated.  Will discontinue amantadine-patient reports he was taking for "tremors" which she states were likely related to episodes of benzodiazepine  withdrawal, does not endorse essential tremors.   Treatment Plan Summary: Daily contact with patient to assess and evaluate symptoms and progress in treatment, Medication management, Plan inpatient treatment and medications as below  Treatment plan reviewed as below today 6/12.  Continue  Remeron  15  mgrs QHS for insomnia , depression, anxiety Change  Ativan to 2 mgrs BID -  will taper gradually  Continue Metformin 1000 mgrs BID for DM  Continue ASA 81 mgrs QDAY  Continue Protonix 40 mgrs QDAY for GERD symptoms/gastric protection Continue Lipitor for hypercholesterolemia Continue Cymbalta 120 mgrs QDAY for depression, anxiety D/C Amantadine Treatment team working on disposition planning options Patient reports she is willing to consider ECT as an option, particularly if current medication regimen does not result in further improvement in mood.  With her consent will request ECT consultation (Dr. Weber Cooks at [facility])  Jeanette Campus, MD 03/09/2019, 11:02 AM   Patient ID: Jeanette Rose, female   DOB: Jan 02, 1956, 63 y.o.   MRN: [id number]

## 2019-03-09 NOTE — Progress Notes (Signed)
Patient ID: Shiya Fogelman, female   DOB: 07/02/1956, 63 y.o.   MRN: [id number]  Nursing Progress Note [id number]  Patient presents very anxious this morning with several bouts of crying spells. Patient states there is no specific trigger. Patient currently denies SI/HI/AVH. Patient is seen interactive with peers in the milieu that are supportive of her. Patient makes her needs known to staff and does appear to have some improvement throughout the day.  Patient is educated about and provided medication per provider's orders. Patient safety maintained with q15 min safety checks and high fall risk precautions. Emotional support given, 1:1 interaction, and active listening provided. Patient encouraged to attend meals, groups, and work on treatment plan and goals. Labs, vital signs and patient behavior monitored throughout shift.   Patient contracts for safety with staff. Patient remains safe on the unit at this time and agrees to come to staff with any issues/concerns. Patient is interacting with peers appropriately on the unit. Will continue to support and monitor.

## 2019-03-10 LAB — GLUCOSE, CAPILLARY: Glucose-Capillary: 133 mg/dL — ABNORMAL HIGH (ref 70–99)

## 2019-03-10 NOTE — Progress Notes (Signed)
D: Pt appeared depressed upon approach. She said she was feeling better than she had in the morning. She said she normally feels better in the evenings. She denied SI, HI, and AVH. She was compliant with medications and present in the dayroom with peers.   A: Scheduled meds given as ordered, with no adverse side effects noted or reported. Pt urged to bring concerns, needs, and questions to staff as needed. Pt urged to attend groups and proceed with treatment plan. Q15 safety checks maintained.  R: Pt remains free from harm and continues with treatment. Will continue to monitor for needs/safety.

## 2019-03-10 NOTE — Progress Notes (Addendum)
[facility] MD Progress Note  03/10/2019 10:00 AM Jeanette Rose  MRN:  [id number]  Subjective: " My anxiety os still really high. I AM HOPING I can get this under control."   Objective : face top face evaluation completed and chart reviewed. In brief, this is a 63 year old female who presented to the the unit after being transferred from [facility] urgency department on 03/04/2019 with suicidal ideation. She presents with a history of posttraumatic stress disorder, generalized anxiety disorder, major depression and benzodiazepine dependence.  During  this evaluation, she is alert and oriented x3, calm and cooperative. She endorses on going anxiety as well as withdrawal from the benzodiazepine dependence. She endorses ongoing depression and her mood is noted to be depressed with a flat/depressed affect. She denies suicidal thoughts although states," at times, I just feel like I don't want to go on." She denies plan or intent to harm herself. She denies homicidal thoughts or any psychotic symptoms. She denies concerns with sleep or appetite. As per staff, patient continues to interact with selected peers. although she has not displayed any disruptive or agitated behaviors. She continues to present with tremors    Principal Problem:  MDD, BZD Dependence Diagnosis: Active Problems:   MDD (major depressive disorder), recurrent episode, severe ([place])  Total Time spent with patient: 15 minutes  Past Psychiatric History:   Past Medical History:  Past Medical History:  Diagnosis Date  . Acute acalculous cholecystitis s/p lap chole 04/03/2014 04/02/2014  . Allergy   . Anemia   . Anxiety   . Asthma   . Depression   . Fatty liver    noted on CT per [facility] records  . Fibromyalgia   . GERD (gastroesophageal reflux disease)   . Hyperlipidemia associated with type 2 diabetes mellitus ([place])   . Hypertension   . Hypothyroid 2015   Transiently in 2015. Synthroid 16mg was stopped 05/07/2016 w/ tsh nml  following.  . Obesity (BMI 30-39.9)   . OSA (obstructive sleep apnea)    non-compliant with CPAP  . Seborrheic dermatitis    [facility]  . Stroke ([facility]    TIA  . Type 2 diabetes mellitus ([place]     Past Surgical History:  Procedure Laterality Date  . BREAST REDUCTION SURGERY  06/2008  . CHOLECYSTECTOMY N/A 04/03/2014   Procedure: LAPAROSCOPIC CHOLECYSTECTOMY ;  Surgeon: SAdin Hector MD;  Location: [facility] ORS;  Service: General;  Laterality: N/A;  . REDUCTION MAMMAPLASTY    . TUBAL LIGATION     Family History:  Family History  Problem Relation Age of Onset  . Atrial fibrillation Mother   . Heart disease Father        CAD  . Diabetes Brother   . Cancer Maternal Grandmother   . Diabetes Paternal Grandmother   . Fibromyalgia Sister   . Colitis Sister    Family Psychiatric  History:  Social History:  Social History   Substance and Sexual Activity  Alcohol Use No     Social History   Substance and Sexual Activity  Drug Use No    Social History   Socioeconomic History  . Marital status: Divorced    Spouse name: Not on file  . Number of children: Not on file  . Years of education: Not on file  . Highest education level: Not on file  Occupational History  . Not on file  Social Needs  . Financial resource strain: Not on file  .  Food insecurity    Worry: Not on file    Inability: Not on file  . Transportation needs    Medical: Not on file    Non-medical: Not on file  Tobacco Use  . Smoking status: Former Smoker    Packs/day: 4.00    Years: 15.00    Pack years: 60.00    Types: Cigarettes    Quit date: 04/22/2004    Years since quitting: 14.8  . Smokeless tobacco: Never Used  . Tobacco comment: 4 pack year hx  Substance and Sexual Activity  . Alcohol use: No  . Drug use: No  . Sexual activity: Never  Lifestyle  . Physical activity    Days per week: Not on file    Minutes per session: Not on file  . Stress: Not on file   Relationships  . Social Herbalist on phone: Not on file    Gets together: Not on file    Attends religious service: Not on file    Active member of club or organization: Not on file    Attends meetings of clubs or organizations: Not on file    Relationship status: Not on file  Other Topics Concern  . Not on file  Social History Narrative  . Not on file   Additional Social History:    Pain Medications: see MAR Prescriptions: see MAR Over the Counter: see MAR History of alcohol / drug use?: No history of alcohol / drug abuse Longest period of sobriety (when/how long): no drug/alcohol/tobacco use Negative Consequences of Use: Financial, Personal relationships Withdrawal Symptoms: Tremors  Sleep: Fair  Appetite:  Good  Current Medications: Current Facility-Administered Medications  Medication Dose Route Frequency Provider Last Rate Last Dose  . acetaminophen (TYLENOL) tablet 650 mg  650 mg Oral Q6H PRN Sharma Covert, MD      . alum & mag hydroxide-simeth (MAALOX/MYLANTA) 200-200-20 MG/5ML suspension 30 mL  30 mL Oral Q4H PRN Sharma Covert, MD      . aspirin EC tablet 81 mg  81 mg Oral Daily Sharma Covert, MD   81 mg at 03/10/19 0825  . atorvastatin (LIPITOR) tablet 80 mg  80 mg Oral q1800 Sharma Covert, MD   80 mg at 03/09/19 1639  . DULoxetine (CYMBALTA) DR capsule 120 mg  120 mg Oral Daily Sharma Covert, MD   120 mg at 03/10/19 9518  . feeding supplement (ENSURE ENLIVE) (ENSURE ENLIVE) liquid 237 mL  237 mL Oral BID BM Sharma Covert, MD   237 mL at 03/05/19 1108  . hydrOXYzine (ATARAX/VISTARIL) tablet 25 mg  25 mg Oral TID PRN Sharma Covert, MD   25 mg at 03/09/19 2108  . irbesartan (AVAPRO) tablet 150 mg  150 mg Oral Daily Sharma Covert, MD   150 mg at 03/10/19 8416  . LORazepam (ATIVAN) tablet 1 mg  1 mg Oral Q6H PRN Brahm Barbeau, Myer Peer, MD   1 mg at 03/09/19 1008  . LORazepam (ATIVAN) tablet 1 mg  1 mg Oral Once Algenis Ballin,  Tala Eber A, MD      . LORazepam (ATIVAN) tablet 2 mg  2 mg Oral BID Miasha Emmons, Myer Peer, MD   2 mg at 03/10/19 0824  . magnesium hydroxide (MILK OF MAGNESIA) suspension 30 mL  30 mL Oral Daily PRN Sharma Covert, MD   30 mL at 03/05/19 1759  . metFORMIN (GLUCOPHAGE-XR) 24 hr tablet 1,000 mg  1,000 mg Oral BID  WC Sharma Covert, MD   1,000 mg at 03/10/19 0827  . mirtazapine (REMERON) tablet 15 mg  15 mg Oral QHS Al Bracewell, Myer Peer, MD   15 mg at 03/09/19 2106  . multivitamin with minerals tablet 1 tablet  1 tablet Oral Daily Sharma Covert, MD   1 tablet at 03/10/19 0826  . nitrofurantoin (macrocrystal-monohydrate) (MACROBID) capsule 100 mg  100 mg Oral Q12H Sharma Covert, MD   100 mg at 03/10/19 0826  . OXcarbazepine (TRILEPTAL) tablet 150 mg  150 mg Oral BID Rykar Lebleu, Myer Peer, MD   150 mg at 03/10/19 0825  . pantoprazole (PROTONIX) EC tablet 40 mg  40 mg Oral BH-q7a [name], Myer Peer, MD   40 mg at 03/10/19 0616  . polyethylene glycol (MIRALAX / GLYCOLAX) packet 17 g  17 g Oral Daily PRN Connye Burkitt, NP      . thiamine (VITAMIN B-1) tablet 100 mg  100 mg Oral Daily Sharma Covert, MD   100 mg at 03/10/19 8413    Lab Results:  Results for orders placed or performed during the hospital encounter of 03/04/19 (from the past 48 hour(s))  Glucose, capillary     Status: Abnormal   Collection Time: 03/09/19  5:54 AM  Result Value Ref Range   Glucose-Capillary 115 (H) 70 - 99 mg/dL  Hemoglobin A1c     Status: Abnormal   Collection Time: 03/09/19  6:48 AM  Result Value Ref Range   Hgb A1c MFr Bld 6.3 (H) 4.8 - 5.6 %    Comment: (NOTE) Pre diabetes:          5.7%-6.4% Diabetes:              >6.4% Glycemic control for   <7.0% adults with diabetes    Mean Plasma Glucose 134.11 mg/dL    Comment: Performed at [facility], [place] [address], [place], [place] [address]  Glucose, capillary     Status: Abnormal   Collection Time: 03/10/19  5:59 AM  Result Value Ref Range    Glucose-Capillary 133 (H) 70 - 99 mg/dL    Blood Alcohol level:  Lab Results  Component Value Date   ETH <10 02/72/5366    Metabolic Disorder Labs: Lab Results  Component Value Date   HGBA1C 6.3 (H) 03/09/2019   MPG 134.11 03/09/2019   MPG 180 (H) 04/03/2014   No results found for: PROLACTIN Lab Results  Component Value Date   CHOL 174 10/09/2018   TRIG 210 (H) 10/09/2018   HDL 44 10/09/2018   CHOLHDL 4.0 10/09/2018   LDLCALC 88 10/09/2018   LDLCALC 77 06/28/2018    Physical Findings: AIMS: Facial and Oral Movements Muscles of Facial Expression: None, normal Lips and Perioral Area: None, normal Jaw: None, normal Tongue: None, normal,Extremity Movements Upper (arms, wrists, hands, fingers): None, normal Lower (legs, knees, ankles, toes): None, normal, Trunk Movements Neck, shoulders, hips: None, normal, Overall Severity Severity of abnormal movements (highest score from questions above): None, normal Incapacitation due to abnormal movements: None, normal Patient's awareness of abnormal movements (rate only patient's report): No Awareness, Dental Status Current problems with teeth and/or dentures?: No Does patient usually wear dentures?: No  CIWA:  CIWA-Ar Total: 1 COWS:  COWS Total Score: 1  Musculoskeletal: Strength & Muscle Tone: within normal limits- no significant tremors, no diaphoresis, no restlessness ,appears comfortable Gait & Station: normal Patient leans: N/A  Psychiatric Specialty Exam: Physical Exam  Nursing note and vitals reviewed.  Constitutional: She is oriented to person, place, and time.  Neurological: She is alert and oriented to person, place, and time.    Review of Systems  Psychiatric/Behavioral: Positive for depression. Negative for hallucinations, memory loss, substance abuse and suicidal ideas. The patient is nervous/anxious. The patient does not have insomnia.   All other systems reviewed and are negative.  denies headache, denies  visual disturbances, no chest pain, no shortness of breath,  denies dysuria or urgency at this time,  no fever or chills   Blood pressure 127/84, pulse (!) 114, temperature 98 F (36.7 C), temperature source Oral, resp. rate 20, height 5' 4"  (1.626 m), weight 76.7 kg, SpO2 99 %.Body mass index is 29.01 kg/m.  General Appearance: Fairly Groomed  Eye Contact:  Good  Speech:  Clear and Coherent and Normal Rate  Volume:  Normal  Mood:  Anxious and Depressed  Affect:  Depressed, Flat and Labile  Thought Process:  Linear and Descriptions of Associations: Intact  Orientation:  Full (Time, Place, and Person)  Thought Content:  denies hallucinations,no delusions, not internally preoccupied   Suicidal Thoughts:  No denies suicidal or self injurious ideations, denies homicidal or violent ideations  Homicidal Thoughts:  No  Memory:  recent and remote grossly intact   Judgement:  Other:  fair/improving  Insight:  Fair  Psychomotor Activity:  Tremors noted.    Concentration:  Concentration: Good and Attention Span: Good  Recall:  Good  Fund of Knowledge:  Good  Language:  Good  Akathisia:  Negative  Handed:  Right  AIMS (if indicated):     Assets:  Desire for Improvement Resilience  ADL's:  Intact  Cognition:  WNL  Sleep:  Number of Hours: 6.75   Assessment : 63 year old female, history of depression, anxiety, PTSD ( related to son's death, who passed away from an overdose several years ago). Presented to ED reporting worsening depression, worsening anxiety, suicidal ideations. Patient reports BZD doses had been increased recently, and was also taking more than prescribed at times, up to Ativan 7 mgrs daily prior to admission.    Treatment Plan Summary: Reviewed current treatment plan 03/10/2019. Will continue the following plan with no adjustments at this time.  Daily contact with patient to assess and evaluate symptoms and progress in treatment, Medication management, Plan inpatient treatment  and medications as below  Treatment plan reviewed as below today 6/12.  Continue  Remeron  15  mgrs QHS for insomnia , depression, anxiety Continue Ativan to 2 mgrs BID -  will taper gradually  Continue Metformin 1000 mgrs BID for DM  Continue ASA 81 mgrs QDAY  Continue Protonix 40 mgrs QDAY for GERD symptoms/gastric protection Continue Lipitor for hypercholesterolemia Continue Cymbalta 120 mgrs QDAY for depression, anxiety D/C Amantadine Treatment team working on disposition planning options Patient reports she is willing to consider ECT as an option, particularly if current medication regimen does not result in further improvement in mood.  With her consent will request ECT consultation (Dr. Weber Cooks at [facility])  Mordecai Maes, NP 03/10/2019, 10:00 AM   Patient ID: Jeanette Rose, female   DOB: 02/03/1956, 63 y.o.   MRN: [id number] Attest to NP progress note

## 2019-03-10 NOTE — Progress Notes (Signed)
[place] NOVEL CORONAVIRUS (COVID-19) DAILY CHECK-OFF SYMPTOMS - answer yes or no to each - every day NO YES  Have you had a fever in the past 24 hours?  . Fever (Temp > 37.80C / 100F) X   Have you had any of these symptoms in the past 24 hours? . New Cough .  Sore Throat  .  Shortness of Breath .  Difficulty Breathing .  Unexplained Body Aches   X   Have you had any one of these symptoms in the past 24 hours not related to allergies?   . Runny Nose .  Nasal Congestion .  Sneezing   X   If you have had runny nose, nasal congestion, sneezing in the past 24 hours, has it worsened?  X   EXPOSURES - check yes or no X   Have you traveled outside the state in the past 14 days?  X   Have you been in contact with someone with a confirmed diagnosis of COVID-19 or PUI in the past 14 days without wearing appropriate PPE?  X   Have you been living in the same home as a person with confirmed diagnosis of COVID-19 or a PUI (household contact)?    X   Have you been diagnosed with COVID-19?    X              What to do next: Answered NO to all: Answered YES to anything:   Proceed with unit schedule Follow the [facility] Inpatient Flowsheet.   

## 2019-03-10 NOTE — [facility] Group Notes (Signed)
[place] Group Notes:  (Nursing)  Date:  03/10/2019  Time: 130 PM Type of Therapy:  Nurse Education  Participation Level:  Minimal  Participation Quality:  Appropriate  Affect:  Flat  Cognitive:  Appropriate  Insight:  Appropriate  Engagement in Group:  Lacking  Modes of Intervention:  Discussion and Education  Summary of Progress/Problems: Group played a non competitive Data processing manager game that fosters listening skills as well as self expression.  Waymond Cera 03/10/2019, 8:10 PM

## 2019-03-10 NOTE — [facility] Group Notes (Signed)
[place] LCSW Group Therapy Note  03/10/2019  10:00-11:00AM  Type of Therapy and Topic:  Group Therapy - Accepting We Are All Damaged People  Participation Level:  Active   Description of Group:  Patients in this group were asked to share whether they feel that they are "damaged" and explain their responses.  A song entitled "Damaged People" was then played, followed by a discussion of the relevance/relatedness of this song to each patient.   The conclusion of the group was that our goal as humans does not need to be perfection, but rather growth.  Insights among group members were shared, including that it is easy to point the fingers at others as being damaged, but actually we need to realize that we also are flawed humans with problems to overcome.  The group concluded with an emphasis on how this is ultimately a message of hope that we face struggles like every other person in the world, and that we are not alone.  Therapeutic Goals: 1)  introduce the concept of pain and hardship being universal  2)  connect emotionally to a musical message and to other group members  3)  identify the patient's current beliefs about their own broken methods of resolving their life problems to date, specifically related to this hospitalization  4)  allow time and space for patients to vent their pain and receive support from other patients  5)  elicit hope that arises from realizing we are not alone in our human struggles   Summary of Patient Progress:  The patient expressed that she does feel she is "damaged," because her son died 8-1/2 years ago and her daughter/2 grandsons have distanced themselves from her.  She was tearful and agitated early in group, then seemed to calm down some after getting medicine.  When asked if she could see any improvement in her grief in the last 8 years, she stated "No, because my washing machine broke down right before I came here and now my truck won't start."  Her insight was  limited.  Therapeutic Modalities:   Motivational Interviewing Activity  Maretta Los  03/10/2019 1:02 PM

## 2019-03-10 NOTE — Progress Notes (Signed)
D. Pt presents with a flat affect/ anxious mood- -calm, cooperative behavior- per pt's self inventory this am, pt rated her depression, hopelessness and anxiety an 05/02/09, respectively.   Pt currently denies SI/HI and AVH A. Labs and vitals monitored. Pt compliant with medications. Pt supported emotionally and encouraged to express concerns and ask questions.   R. Pt remains safe with 15 minute checks. Will continue POC.

## 2019-03-10 NOTE — Progress Notes (Signed)
[place] NOVEL CORONAVIRUS (COVID-19) DAILY CHECK-OFF SYMPTOMS - answer yes or no to each - every day NO YES  Have you had a fever in the past 24 hours?  . Fever (Temp > 37.80C / 100F) X   Have you had any of these symptoms in the past 24 hours? . New Cough .  Sore Throat  .  Shortness of Breath .  Difficulty Breathing .  Unexplained Body Aches   X   Have you had any one of these symptoms in the past 24 hours not related to allergies?   . Runny Nose .  Nasal Congestion .  Sneezing   X   If you have had runny nose, nasal congestion, sneezing in the past 24 hours, has it worsened?  X   EXPOSURES - check yes or no X   Have you traveled outside the state in the past 14 days?  X   Have you been in contact with someone with a confirmed diagnosis of COVID-19 or PUI in the past 14 days without wearing appropriate PPE?  X   Have you been living in the same home as a person with confirmed diagnosis of COVID-19 or a PUI (household contact)?    X   Have you been diagnosed with COVID-19?    X              What to do next: Answered NO to all: Answered YES to anything:   Proceed with unit schedule Follow the [facility] Inpatient Flowsheet.   

## 2019-03-10 NOTE — Progress Notes (Signed)
Appears flat, depressed and anxious with many tearful events. Reports that she is "having a hard time trying to get off these medications, my washer broke and my sister said my car battery dies, its just everything all at once."  Support provided. Denies si/hi/pain. Contracts for safety

## 2019-03-11 LAB — GLUCOSE, CAPILLARY: Glucose-Capillary: 154 mg/dL — ABNORMAL HIGH (ref 70–99)

## 2019-03-11 MED ORDER — LORAZEPAM 1 MG PO TABS
1.5000 mg | ORAL_TABLET | Freq: Every day | ORAL | Status: DC
  Administered 2019-03-11 – 2019-03-14 (×4): 1.5 mg via ORAL
  Filled 2019-03-11 (×3): qty 1

## 2019-03-11 MED ORDER — OXCARBAZEPINE 150 MG PO TABS
150.0000 mg | ORAL_TABLET | Freq: Every day | ORAL | Status: DC
  Administered 2019-03-12: 150 mg via ORAL
  Filled 2019-03-11 (×3): qty 1

## 2019-03-11 MED ORDER — DULOXETINE HCL 60 MG PO CPEP
60.0000 mg | ORAL_CAPSULE | Freq: Every day | ORAL | Status: DC
  Administered 2019-03-12 – 2019-03-15 (×4): 60 mg via ORAL
  Filled 2019-03-11 (×6): qty 1

## 2019-03-11 MED ORDER — LORAZEPAM 1 MG PO TABS
2.0000 mg | ORAL_TABLET | ORAL | Status: DC
  Administered 2019-03-12 – 2019-03-13 (×2): 2 mg via ORAL
  Filled 2019-03-11 (×2): qty 2

## 2019-03-11 MED ORDER — LORAZEPAM 1 MG PO TABS: 1.0000 mg | ORAL_TABLET | Freq: Three times a day (TID) | ORAL | Status: DC | PRN

## 2019-03-11 MED ORDER — OXCARBAZEPINE 300 MG PO TABS
300.0000 mg | ORAL_TABLET | ORAL | Status: DC
  Administered 2019-03-12 – 2019-03-13 (×2): 300 mg via ORAL
  Filled 2019-03-11 (×4): qty 1

## 2019-03-11 NOTE — Progress Notes (Signed)
Patient ID: Jeanette Rose, female   DOB: [birth year], 63 y.o.   MRN: [id number]  Patient provided written and verbal consent for staff to retreive her black purse from locker #17 and provide to her sister [name]. Purse and all of it's contents were provided to [employer]

## 2019-03-11 NOTE — Progress Notes (Signed)
D:Pt c/o anxiety and has mild tremors. She reports having constipation and when offered MOM or Miralax, pt refused. Pt has a sad/depressed and report passive si thoughts.  A:Offered support, encouragement and 15 minute checks.  R:Pt contracts with staff for safety. Safety maintained on the unit.

## 2019-03-11 NOTE — Progress Notes (Signed)
Adult Psychoeducational Group Note  Date:  03/11/2019 Time:  9:08 PM  Group Topic/Focus:  Wrap-Up Group:   The focus of this group is to help patients review their daily goal of treatment and discuss progress on daily workbooks.  Participation Level:  Active  Participation Quality:  Appropriate and Attentive  Affect:  Anxious  Cognitive:  Alert, Appropriate and Oriented  Insight: Limited  Engagement in Group:  Engaged  Modes of Intervention:  Discussion and Education  Additional Comments:  Pt attended and participated in group. Pt stated her goal today was to do better and cope with her withdrawal. Pt reported struggling with her goal and rated her day a 3/10.  Milus Glazier 03/11/2019, 9:08 PM

## 2019-03-11 NOTE — Progress Notes (Addendum)
[facility] MD Progress Note  03/11/2019 3:58 PM Jeanette Rose  MRN:  [id number]  Subjective: patient reports persistent anxiety. She also reports ongoing depression, but does feel mood has improved " a little".   Objective : I have met with patient and have reviewed chart. 63 year old female, history of depression, anxiety, PTSD ( related to son's death, who passed away from an overdose several years ago). Presented to ED reporting worsening depression, worsening anxiety, suicidal ideations. Patient reports BZD doses had been increased recently, and was also taking more than prescribed at times, up to Ativan 7 mgrs daily prior to admission  Patient presents relatively calm at this time, and was not tearful or visibly distressed or agitated during this session. She does report persistent depression and a subjective sense of severe anxiety, which she states tends to be worse in the mornings . Staff has noted crying episodes. Responds to supportive interventions. She denies medication side effects.  She wants to continue tapering Ativan and is hoping to completely come off BZDs completely  at some point. Increased anxiety /mood lability has been noted during prior Ativan dose decrease, and she had also endorsed /presented with some possible withdrawal symptomatology . Therefore, we have reviewed rationale to proceed with a slow taper which she may be able to continue under her outpatient psychiatrist's supervision following discharge. She is visible on unit, going to some groups . Has endorsed intermittent passive SI, but currently denies any SI or self injurious ideations,and contracts for safety at this time. CBG today 154. 6/12 HgbA1C 6.3     Principal Problem:  MDD, BZD Dependence Diagnosis: Active Problems:   MDD (major depressive disorder), recurrent episode, severe ([place])  Total Time spent with patient: 15 minutes  Past Psychiatric History:   Past Medical History:  Past Medical History:   Diagnosis Date  . Acute acalculous cholecystitis s/p lap chole 04/03/2014 04/02/2014  . Allergy   . Anemia   . Anxiety   . Asthma   . Depression   . Fatty liver    noted on CT per [facility] records  . Fibromyalgia   . GERD (gastroesophageal reflux disease)   . Hyperlipidemia associated with type 2 diabetes mellitus ([place])   . Hypertension   . Hypothyroid 2015   Transiently in 2015. Synthroid 86mg was stopped 05/07/2016 w/ tsh nml following.  . Obesity (BMI 30-39.9)   . OSA (obstructive sleep apnea)    non-compliant with CPAP  . Seborrheic dermatitis    [facility]  . Stroke ([facility]    TIA  . [place]     Past Surgical History:  Procedure Laterality Date  . BREAST REDUCTION SURGERY  06/2008  . CHOLECYSTECTOMY N/A 04/03/2014   Procedure: LAPAROSCOPIC CHOLECYSTECTOMY ;  Surgeon: SAdin Hector MD;  Location: [facility] ORS;  Service: General;  Laterality: N/A;  . REDUCTION MAMMAPLASTY    . TUBAL LIGATION     Family History:  Family History  Problem Relation Age of Onset  . Atrial fibrillation Mother   . Heart disease Father        CAD  . Diabetes Brother   . Cancer Maternal Grandmother   . Diabetes Paternal Grandmother   . Fibromyalgia Sister   . Colitis Sister    Family Psychiatric  History:  Social History:  Social History   Substance and Sexual Activity  Alcohol Use No     Social History   Substance and Sexual  Activity  Drug Use No    Social History   Socioeconomic History  . Marital status: Divorced    Spouse name: Not on file  . Number of children: Not on file  . Years of education: Not on file  . Highest education level: Not on file  Occupational History  . Not on file  Social Needs  . Financial resource strain: Not on file  . Food insecurity    Worry: Not on file    Inability: Not on file  . Transportation needs    Medical: Not on file    Non-medical: Not on file  Tobacco Use  . Smoking status:  Former Smoker    Packs/day: 4.00    Years: 15.00    Pack years: 60.00    Types: Cigarettes    Quit date: 04/22/2004    Years since quitting: 14.8  . Smokeless tobacco: Never Used  . Tobacco comment: 4 pack year hx  Substance and Sexual Activity  . Alcohol use: No  . Drug use: No  . Sexual activity: Never  Lifestyle  . Physical activity    Days per week: Not on file    Minutes per session: Not on file  . Stress: Not on file  Relationships  . Social Herbalist on phone: Not on file    Gets together: Not on file    Attends religious service: Not on file    Active member of club or organization: Not on file    Attends meetings of clubs or organizations: Not on file    Relationship status: Not on file  Other Topics Concern  . Not on file  Social History Narrative  . Not on file   Additional Social History:    Pain Medications: see MAR Prescriptions: see MAR Over the Counter: see MAR History of alcohol / drug use?: No history of alcohol / drug abuse Longest period of sobriety (when/how long): no drug/alcohol/tobacco use Negative Consequences of Use: Financial, Personal relationships Withdrawal Symptoms: Tremors  Sleep: improving  Appetite:  Good  Current Medications: Current Facility-Administered Medications  Medication Dose Route Frequency Provider Last Rate Last Dose  . acetaminophen (TYLENOL) tablet 650 mg  650 mg Oral Q6H PRN Sharma Covert, MD      . alum & mag hydroxide-simeth (MAALOX/MYLANTA) 200-200-20 MG/5ML suspension 30 mL  30 mL Oral Q4H PRN Sharma Covert, MD      . aspirin EC tablet 81 mg  81 mg Oral Daily Sharma Covert, MD   81 mg at 03/11/19 0815  . atorvastatin (LIPITOR) tablet 80 mg  80 mg Oral q1800 Sharma Covert, MD   80 mg at 03/10/19 1731  . DULoxetine (CYMBALTA) DR capsule 120 mg  120 mg Oral Daily Sharma Covert, MD   120 mg at 03/11/19 0814  . feeding supplement (ENSURE ENLIVE) (ENSURE ENLIVE) liquid 237 mL  237 mL  Oral BID BM Sharma Covert, MD   237 mL at 03/05/19 1108  . hydrOXYzine (ATARAX/VISTARIL) tablet 25 mg  25 mg Oral TID PRN Sharma Covert, MD   25 mg at 03/11/19 1155  . irbesartan (AVAPRO) tablet 150 mg  150 mg Oral Daily Sharma Covert, MD   150 mg at 03/11/19 0815  . LORazepam (ATIVAN) tablet 1 mg  1 mg Oral Q6H PRN , Myer Peer, MD   1 mg at 03/10/19 1029  . LORazepam (ATIVAN) tablet 1 mg  1 mg Oral Once  , Myer Peer, MD      . LORazepam (ATIVAN) tablet 2 mg  2 mg Oral BID , Myer Peer, MD   2 mg at 03/11/19 0813  . magnesium hydroxide (MILK OF MAGNESIA) suspension 30 mL  30 mL Oral Daily PRN Sharma Covert, MD   30 mL at 03/05/19 1759  . metFORMIN (GLUCOPHAGE-XR) 24 hr tablet 1,000 mg  1,000 mg Oral BID WC Sharma Covert, MD   1,000 mg at 03/11/19 0813  . mirtazapine (REMERON) tablet 15 mg  15 mg Oral QHS , Myer Peer, MD   15 mg at 03/10/19 2100  . multivitamin with minerals tablet 1 tablet  1 tablet Oral Daily Sharma Covert, MD   1 tablet at 03/11/19 0816  . nitrofurantoin (macrocrystal-monohydrate) (MACROBID) capsule 100 mg  100 mg Oral Q12H Sharma Covert, MD   100 mg at 03/11/19 0818  . OXcarbazepine (TRILEPTAL) tablet 150 mg  150 mg Oral BID , Myer Peer, MD   150 mg at 03/11/19 0815  . pantoprazole (PROTONIX) EC tablet 40 mg  40 mg Oral BH-q7a , Myer Peer, MD   40 mg at 03/11/19 0616  . polyethylene glycol (MIRALAX / GLYCOLAX) packet 17 g  17 g Oral Daily PRN Connye Burkitt, NP      . thiamine (VITAMIN B-1) tablet 100 mg  100 mg Oral Daily Sharma Covert, MD   100 mg at 03/11/19 0815    Lab Results:  Results for orders placed or performed during the hospital encounter of 03/04/19 (from the past 48 hour(s))  Glucose, capillary     Status: Abnormal   Collection Time: 03/10/19  5:59 AM  Result Value Ref Range   Glucose-Capillary 133 (H) 70 - 99 mg/dL  Glucose, capillary     Status: Abnormal   Collection Time: 03/11/19   5:53 AM  Result Value Ref Range   Glucose-Capillary 154 (H) 70 - 99 mg/dL   Comment 1 Notify RN    Comment 2 Document in Chart     Blood Alcohol level:  Lab Results  Component Value Date   ETH <10 54/65/6812    Metabolic Disorder Labs: Lab Results  Component Value Date   HGBA1C 6.3 (H) 03/09/2019   MPG 134.11 03/09/2019   MPG 180 (H) 04/03/2014   No results found for: PROLACTIN Lab Results  Component Value Date   CHOL 174 10/09/2018   TRIG 210 (H) 10/09/2018   HDL 44 10/09/2018   CHOLHDL 4.0 10/09/2018   LDLCALC 88 10/09/2018   LDLCALC 77 06/28/2018    Physical Findings: AIMS: Facial and Oral Movements Muscles of Facial Expression: None, normal Lips and Perioral Area: None, normal Jaw: None, normal Tongue: None, normal,Extremity Movements Upper (arms, wrists, hands, fingers): None, normal Lower (legs, knees, ankles, toes): None, normal, Trunk Movements Neck, shoulders, hips: None, normal, Overall Severity Severity of abnormal movements (highest score from questions above): None, normal Incapacitation due to abnormal movements: None, normal Patient's awareness of abnormal movements (rate only patient's report): No Awareness, Dental Status Current problems with teeth and/or dentures?: No Does patient usually wear dentures?: No  CIWA:  CIWA-Ar Total: 3 COWS:  COWS Total Score: 6  Musculoskeletal: Strength & Muscle Tone: within normal limits- no significant tremors, no diaphoresis, no restlessness ,appears comfortable Gait & Station: normal Patient leans: N/A  Psychiatric Specialty Exam: Physical Exam  Nursing note and vitals reviewed. Constitutional: She is oriented to person, place, and time.  Neurological: She is  alert and oriented to person, place, and time.    Review of Systems  Psychiatric/Behavioral: Positive for depression. Negative for hallucinations, memory loss, substance abuse and suicidal ideas. The patient is nervous/anxious. The patient does not  have insomnia.   All other systems reviewed and are negative.  denies headache, denies visual disturbances, no chest pain, no shortness of breath,  denies dysuria or urgency at this time,  no fever or chills   Blood pressure (!) 163/91, pulse (!) 133, temperature 98 F (36.7 C), temperature source Oral, resp. rate 20, height _0  (1.626 m), weight 76.7 kg, SpO2 98 %.Body mass index is 29.01 kg/m.  General Appearance: Fairly Groomed  Eye Contact:  Good  Speech:  Normal Rate  Volume:  Normal  Mood:  some improvement, but still anxious, depressed   Affect:  less labile today and was not tearful during this session  Thought Process:  Linear and Descriptions of Associations: Intact  Orientation:  Full (Time, Place, and Person)  Thought Content:  denies hallucinations,no delusions, not internally preoccupied   Suicidal Thoughts:  No denies suicidal or self injurious ideations, denies homicidal or violent ideations  Homicidal Thoughts:  No  Memory:  recent and remote grossly intact   Judgement:  Other:  fair/improving  Insight:  Fair  Psychomotor Activity:  Tremors noted.    Concentration:  Concentration: Good and Attention Span: Good  Recall:  Good  Fund of Knowledge:  Good  Language:  Good  Akathisia:  Negative  Handed:  Right  AIMS (if indicated):     Assets:  Desire for Improvement Resilience  ADL's:  Intact  Cognition:  WNL  Sleep:  Number of Hours: 6.5   Assessment : 63 year old female, history of depression, anxiety, PTSD ( related to son's death, who passed away from an overdose several years ago). Presented to ED reporting worsening depression, worsening anxiety, suicidal ideations. Patient reports BZD doses had been increased recently, and was also taking more than prescribed at times, up to Ativan 7 mgrs daily prior to admission.  Patient remains depressed and quite anxious, although at this time presents calmer than when I last saw her and no longer tearful. She does  endorse ongoing significant anxiety. Denies suicidal ideations at this time and contracts for safety on unit. Denies medication side effects. We have reviewed medication history- she reports she has been on several antidepressant medications over the years and that the " only one that worked was Cymbalta", although states she does not feel this medication is helping her  any longer . She is now on Remeron, which she is tolerating well thus far . She reports she has been on Ativan for years, and dose had been gradually increased -was at times taking up to 7 mgrs daily. At this time , as above, is motivated and wanting to proceed with taper, but has presented with possible symptoms of WDL and /or rebound anxiety as dose has been decreased , so will taper slowly .Of note, she does think that Trileptal has been effective in decreasing the severity of her anxiety, and states she is feeling better since it was restarted    Treatment Plan Summary:  Daily contact with patient to assess and evaluate symptoms and progress in treatment, Medication management, Plan inpatient treatment and medications as below  Treatment plan reviewed as below today 6/14.  Continue  Remeron  15  mgrs QHS for insomnia , depression, anxiety Decrease Ativan to 1.5 mgrs QAM and 2  mgrs QHS - proceed with gradual BZD taper as tolerated  Increase Trileptal to 300 mgrs QAM and 150 mgrs QHS for mood disorder Continue Metformin 1000 mgrs BID for DM  Continue ASA 81 mgrs QDAY  Continue Protonix 40 mgrs QDAY for GERD symptoms/gastric protection Continue Lipitor for hypercholesterolemia Decrease Cymbalta to 60 mgrs QDAY- consider gradual taper as she feels this medication no longer effective. Treatment team working on disposition planning options She has expressed interest in exploring ECT as an option- with her express consent referral Elyse Hsu has been forwarded to Dr. Weber Cooks at [facility] who does inpatient ECT for our  system.  Jenne Campus, MD 03/11/2019, 3:58 PM   Patient ID: Jeanette Rose, female   DOB: 23-Feb-1956, 63 y.o.   MRN: [id number]

## 2019-03-11 NOTE — Progress Notes (Addendum)
D    Pt is anxious and said she was having bad thoughts because her doctor changed the dose of ativan to bedtime   When pt was told she should be feeling some relief in 30 minutes she said that's too long   She reports that her thoughts were saying to go ahead and kill herself   Pt has contracted for safety on the unit  A   Verbal support given   Medications administered and effectiveness monitored    Q 15 min checks   R   Pt is presently safe and minimal receptiveness to verbal support  [place] NOVEL CORONAVIRUS (COVID-19) DAILY CHECK-OFF SYMPTOMS - answer yes or no to each - every day NO YES  Have you had a fever in the past 24 hours?  . Fever (Temp > 37.80C / 100F) X   Have you had any of these symptoms in the past 24 hours? . New Cough .  Sore Throat  .  Shortness of Breath .  Difficulty Breathing .  Unexplained Body Aches   X   Have you had any one of these symptoms in the past 24 hours not related to allergies?   . Runny Nose .  Nasal Congestion .  Sneezing   X   If you have had runny nose, nasal congestion, sneezing in the past 24 hours, has it worsened?  X   EXPOSURES - check yes or no X   Have you traveled outside the state in the past 14 days?  X   Have you been in contact with someone with a confirmed diagnosis of COVID-19 or PUI in the past 14 days without wearing appropriate PPE?  X   Have you been living in the same home as a person with confirmed diagnosis of COVID-19 or a PUI (household contact)?    X   Have you been diagnosed with COVID-19?    X              What to do next: Answered NO to all: Answered YES to anything:   Proceed with unit schedule Follow the [facility] Inpatient Flowsheet.

## 2019-03-11 NOTE — [facility] Group Notes (Signed)
[place] LCSW Group Therapy Note  03/11/2019   10:00-11:00AM  Type of Therapy and Topic:  Group Therapy:  Unhealthy versus Healthy Supports, Which Am I?  Participation Level:  Minimal   Description of Group:  Patients in this group were introduced to the concept that additional supports including self-support are an essential part of recovery.  Initially a discussion was held about the differences between healthy versus unhealthy supports.  Patients were asked to share what unhealthy supports in their lives need to be addressed, as well as what additional healthy supports could be added for greater help in reaching their goals.   A song entitled "My Own [name]" was played and a group discussion ensued in which patients stated they could relate to the song and it inspired them to realize they have be willing to help themselves in order to succeed, because other people cannot achieve sobriety or stability for them.  We discussed adding a variety of healthy supports to address the various needs in patient lives, including becoming more self-supportive.  A song was played called "I Know Where I've Been" toward the end of group and used to conduct an inspirational wrap-up to group of remembering how far they have already come in their journey.  Therapeutic Goals: 1)  Highlight the differences between healthy and unhealthy supports 2)  Suggest the importance of being a part of one's own support system 2)  Discuss reasons people in one's life may eventually be unable to be continually supportive  3)  Identify the patient's current support system   4) Elicit commitments to add healthy supports and to become more conscious of being self-supportive   Summary of Patient Progress:  The patient listed as healthy supports the following:  Sister, mother.  The patient expressed that unhealthy supports to be addressed include "none".  Patient feels they are a healthy (at times) support for themselves.  Healthy supports  which could be added for increased stability and happiness include therapy.  Therapeutic Modalities:   Motivational Interviewing Activity  Maretta Los , MSW, LCSW

## 2019-03-12 LAB — GLUCOSE, CAPILLARY: Glucose-Capillary: 147 mg/dL — ABNORMAL HIGH (ref 70–99)

## 2019-03-12 NOTE — Plan of Care (Signed)
Nurse discussed anxiety, depression, coping skills with patient. 

## 2019-03-12 NOTE — Progress Notes (Addendum)
D   Pt continues to focus on how much ativan she is going to get and how soon she can get it   She tends to keep to herself and is irritable A   Verbal support given    Medications administered and effectiveness monitored    Q 15 min checks R   Pt remains safe at this time  [place] NOVEL CORONAVIRUS (COVID-19) DAILY CHECK-OFF SYMPTOMS - answer yes or no to each - every day NO YES  Have you had a fever in the past 24 hours?  . Fever (Temp > 37.80C / 100F) X   Have you had any of these symptoms in the past 24 hours? . New Cough .  Sore Throat  .  Shortness of Breath .  Difficulty Breathing .  Unexplained Body Aches   X   Have you had any one of these symptoms in the past 24 hours not related to allergies?   . Runny Nose .  Nasal Congestion .  Sneezing   X   If you have had runny nose, nasal congestion, sneezing in the past 24 hours, has it worsened?  X   EXPOSURES - check yes or no X   Have you traveled outside the state in the past 14 days?  X   Have you been in contact with someone with a confirmed diagnosis of COVID-19 or PUI in the past 14 days without wearing appropriate PPE?  X   Have you been living in the same home as a person with confirmed diagnosis of COVID-19 or a PUI (household contact)?    X   Have you been diagnosed with COVID-19?    X              What to do next: Answered NO to all: Answered YES to anything:   Proceed with unit schedule Follow the [facility] Inpatient Flowsheet.

## 2019-03-12 NOTE — Progress Notes (Deleted)
[place] NOVEL CORONAVIRUS (COVID-19) DAILY CHECK-OFF SYMPTOMS - answer yes or no to each - every day NO YES  Have you had a fever in the past 24 hours?  . Fever (Temp > 37.80C / 100F) X   Have you had any of these symptoms in the past 24 hours? . New Cough .  Sore Throat  .  Shortness of Breath .  Difficulty Breathing .  Unexplained Body Aches   X   Have you had any one of these symptoms in the past 24 hours not related to allergies?   . Runny Nose .  Nasal Congestion .  Sneezing   X   If you have had runny nose, nasal congestion, sneezing in the past 24 hours, has it worsened?  X   EXPOSURES - check yes or no X   Have you traveled outside the state in the past 14 days?  X   Have you been in contact with someone with a confirmed diagnosis of COVID-19 or PUI in the past 14 days without wearing appropriate PPE?  X   Have you been living in the same home as a person with confirmed diagnosis of COVID-19 or a PUI (household contact)?    X   Have you been diagnosed with COVID-19?    X              What to do next: Answered NO to all: Answered YES to anything:   Proceed with unit schedule Follow the [facility] Inpatient Flowsheet.   

## 2019-03-12 NOTE — Progress Notes (Addendum)
D:  Patient denied HI.  Denied A/V hallucinations.  Patient stated she does have SI thoughts off/on, contracts for safety, no plan.  SI last night and yesterday.  Main concern is to get medications straight, does want to stop using ativan.  Needs to make sure her medications are covered by insurance. A:  Medications administered per MD orders.  Emotional support and encouragement given patient. R:  Safety maintained with 15 minute checks.  Self inventory sheet, patient has fair sleep, no sleep medication.  Good appetite, low energy level, poor concentration.  Rated depression, hopeless and anxiety 6.  Withdrawals.  SI, sometimes.  Denied physical problems.  Denied physical pain.  Goal is talk to MD about meds.  No discharge plans.

## 2019-03-12 NOTE — Progress Notes (Addendum)
[facility] MD Progress Note  03/12/2019 6:25 PM Elisandra Deshmukh  MRN:  [id number]  Subjective: Patient states "I was feeling better until I got a roommate".  Currently presents ruminative about this issue.  States that roommate was pacing room which made her feel very anxious.  She also states she is upset that "the nurses changed me to another room rather than her".  Denies suicidal ideations at this time.  Denies medication side effects.   Objective : I have met with patient and have reviewed chart. 63 year old female, history of depression, anxiety, PTSD ( related to son's death, who passed away from an overdose several years ago). Presented to ED reporting worsening depression, worsening anxiety, suicidal ideations. Patient reports BZD doses had been increased recently, and was also taking more than prescribed at times, up to Ativan 7 mgrs daily prior to admission  Patient endorses partial improvement, although continues to endorse significant anxiety.  She states "sometimes I feel better, then I feel worse".  Today she does appear less labile and not tearful during today's interaction, although, as above, focuses on concerns regarding roommate. Denies suicidal ideations.  Denies medication side effects.  Responsive to support, review of ego strengths and coping skills, affect improves partially during session.      Principal Problem:  MDD, BZD Dependence Diagnosis: Active Problems:   MDD (major depressive disorder), recurrent episode, severe ([place])  Total Time spent with patient: 20 minutes  Past Psychiatric History:   Past Medical History:  Past Medical History:  Diagnosis Date  . Acute acalculous cholecystitis s/p lap chole 04/03/2014 04/02/2014  . Allergy   . Anemia   . Anxiety   . Asthma   . Depression   . Fatty liver    noted on CT per [facility] records  . Fibromyalgia   . GERD (gastroesophageal reflux disease)   . Hyperlipidemia associated with type 2 diabetes mellitus ([place])   .  Hypertension   . Hypothyroid 2015   Transiently in 2015. Synthroid 74mg was stopped 05/07/2016 w/ tsh nml following.  . Obesity (BMI 30-39.9)   . OSA (obstructive sleep apnea)    non-compliant with CPAP  . Seborrheic dermatitis    [facility]  . Stroke ([facility]    TIA  . [place] 2 [place]     Past Surgical History:  Procedure Laterality Date  . BREAST REDUCTION SURGERY  06/2008  . CHOLECYSTECTOMY N/A 04/03/2014   Procedure: LAPAROSCOPIC CHOLECYSTECTOMY ;  Surgeon: SAdin Hector MD;  Location: [facility] ORS;  Service: General;  Laterality: N/A;  . REDUCTION MAMMAPLASTY    . TUBAL LIGATION     Family History:  Family History  Problem Relation Age of Onset  . Atrial fibrillation Mother   . Heart disease Father        CAD  . Diabetes Brother   . Cancer Maternal Grandmother   . Diabetes Paternal Grandmother   . Fibromyalgia Sister   . Colitis Sister    Family Psychiatric  History:  Social History:  Social History   Substance and Sexual Activity  Alcohol Use No     Social History   Substance and Sexual Activity  Drug Use No    Social History   Socioeconomic History  . Marital status: Divorced    Spouse name: Not on file  . Number of children: Not on file  . Years of education: Not on file  . Highest education level: Not on file  Occupational History  . Not on file  Social Needs  . Financial resource strain: Not on file  . Food insecurity    Worry: Not on file    Inability: Not on file  . Transportation needs    Medical: Not on file    Non-medical: Not on file  Tobacco Use  . Smoking status: Former Smoker    Packs/day: 4.00    Years: 15.00    Pack years: 60.00    Types: Cigarettes    Quit date: 04/22/2004    Years since quitting: 14.8  . Smokeless tobacco: Never Used  . Tobacco comment: 4 pack year hx  Substance and Sexual Activity  . Alcohol use: No  . Drug use: No  . Sexual activity: Never  Lifestyle  .  Physical activity    Days per week: Not on file    Minutes per session: Not on file  . Stress: Not on file  Relationships  . Social Herbalist on phone: Not on file    Gets together: Not on file    Attends religious service: Not on file    Active member of club or organization: Not on file    Attends meetings of clubs or organizations: Not on file    Relationship status: Not on file  Other Topics Concern  . Not on file  Social History Narrative  . Not on file   Additional Social History:    Pain Medications: see MAR Prescriptions: see MAR Over the Counter: see MAR History of alcohol / drug use?: No history of alcohol / drug abuse Longest period of sobriety (when/how long): no drug/alcohol/tobacco use Negative Consequences of Use: Financial, Personal relationships Withdrawal Symptoms: Tremors  Sleep: improving  Appetite:  Good  Current Medications: Current Facility-Administered Medications  Medication Dose Route Frequency Provider Last Rate Last Dose  . acetaminophen (TYLENOL) tablet 650 mg  650 mg Oral Q6H PRN Sharma Covert, MD      . alum & mag hydroxide-simeth (MAALOX/MYLANTA) 200-200-20 MG/5ML suspension 30 mL  30 mL Oral Q4H PRN Sharma Covert, MD      . aspirin EC tablet 81 mg  81 mg Oral Daily Sharma Covert, MD   81 mg at 03/12/19 0737  . atorvastatin (LIPITOR) tablet 80 mg  80 mg Oral q1800 Sharma Covert, MD   80 mg at 03/12/19 1707  . DULoxetine (CYMBALTA) DR capsule 60 mg  60 mg Oral Daily , Myer Peer, MD   60 mg at 03/12/19 0737  . feeding supplement (ENSURE ENLIVE) (ENSURE ENLIVE) liquid 237 mL  237 mL Oral BID BM Sharma Covert, MD   237 mL at 03/12/19 1044  . hydrOXYzine (ATARAX/VISTARIL) tablet 25 mg  25 mg Oral TID PRN Sharma Covert, MD   25 mg at 03/12/19 1709  . irbesartan (AVAPRO) tablet 150 mg  150 mg Oral Daily Sharma Covert, MD   150 mg at 03/12/19 0737  . LORazepam (ATIVAN) tablet 1 mg  1 mg Oral Once  ,  A, MD      . LORazepam (ATIVAN) tablet 1 mg  1 mg Oral Q8H PRN ,  A, MD      . LORazepam (ATIVAN) tablet 1.5 mg  1.5 mg Oral QHS , Myer Peer, MD   1.5 mg at 03/11/19 2112  . LORazepam (ATIVAN) tablet 2 mg  2 mg Oral BH-q7a , Myer Peer, MD   2 mg at 03/12/19 0938  .  magnesium hydroxide (MILK OF MAGNESIA) suspension 30 mL  30 mL Oral Daily PRN Sharma Covert, MD   30 mL at 03/05/19 1759  . metFORMIN (GLUCOPHAGE-XR) 24 hr tablet 1,000 mg  1,000 mg Oral BID WC Sharma Covert, MD   1,000 mg at 03/12/19 1707  . mirtazapine (REMERON) tablet 15 mg  15 mg Oral QHS , Myer Peer, MD   15 mg at 03/11/19 2112  . multivitamin with minerals tablet 1 tablet  1 tablet Oral Daily Sharma Covert, MD   1 tablet at 03/12/19 [phone]  . nitrofurantoin (macrocrystal-monohydrate) (MACROBID) capsule 100 mg  100 mg Oral Q12H Sharma Covert, MD   100 mg at 03/12/19 0738  . OXcarbazepine (TRILEPTAL) tablet 150 mg  150 mg Oral QHS ,  A, MD      . Oxcarbazepine (TRILEPTAL) tablet 300 mg  300 mg Oral BH-q7a , Myer Peer, MD   300 mg at 03/12/19 0618  . pantoprazole (PROTONIX) EC tablet 40 mg  40 mg Oral BH-q7a , Myer Peer, MD   40 mg at 03/12/19 0618  . polyethylene glycol (MIRALAX / GLYCOLAX) packet 17 g  17 g Oral Daily PRN Connye Burkitt, NP      . thiamine (VITAMIN B-1) tablet 100 mg  100 mg Oral Daily Sharma Covert, MD   100 mg at 03/12/19 5053    Lab Results:  Results for orders placed or performed during the hospital encounter of 03/04/19 (from the past 48 hour(s))  Glucose, capillary     Status: Abnormal   Collection Time: 03/11/19  5:53 AM  Result Value Ref Range   Glucose-Capillary 154 (H) 70 - 99 mg/dL   Comment 1 Notify RN    Comment 2 Document in Chart   Glucose, capillary     Status: Abnormal   Collection Time: 03/12/19  6:07 AM  Result Value Ref Range   Glucose-Capillary 147 (H) 70 - 99 mg/dL    Blood Alcohol level:   Lab Results  Component Value Date   ETH <10 97/67/3419    Metabolic Disorder Labs: Lab Results  Component Value Date   HGBA1C 6.3 (H) 03/09/2019   MPG 134.11 03/09/2019   MPG 180 (H) 04/03/2014   No results found for: PROLACTIN Lab Results  Component Value Date   CHOL 174 10/09/2018   TRIG 210 (H) 10/09/2018   HDL 44 10/09/2018   CHOLHDL 4.0 10/09/2018   LDLCALC 88 10/09/2018   LDLCALC 77 06/28/2018    Physical Findings: AIMS: Facial and Oral Movements Muscles of Facial Expression: None, normal Lips and Perioral Area: None, normal Jaw: None, normal Tongue: None, normal,Extremity Movements Upper (arms, wrists, hands, fingers): None, normal Lower (legs, knees, ankles, toes): None, normal, Trunk Movements Neck, shoulders, hips: None, normal, Overall Severity Severity of abnormal movements (highest score from questions above): None, normal Incapacitation due to abnormal movements: None, normal Patient's awareness of abnormal movements (rate only patient's report): No Awareness, Dental Status Current problems with teeth and/or dentures?: No Does patient usually wear dentures?: No  CIWA:  CIWA-Ar Total: 1 COWS:  COWS Total Score: 2  Musculoskeletal: Strength & Muscle Tone: within normal limits- no significant tremors, no diaphoresis, no restlessness ,appears comfortable Gait & Station: normal Patient leans: N/A  Psychiatric Specialty Exam: Physical Exam  Nursing note and vitals reviewed. Constitutional: She is oriented to person, place, and time.  Neurological: She is alert and oriented to person, place, and time.    Review of  Systems  Psychiatric/Behavioral: Positive for depression. Negative for hallucinations, memory loss, substance abuse and suicidal ideas. The patient is nervous/anxious. The patient does not have insomnia.   All other systems reviewed and are negative.  denies headache, denies visual disturbances, no chest pain, no shortness of breath,  denies  dysuria or urgency at this time,  no fever or chills   Blood pressure (!) 159/80, pulse (!) 109, temperature 99.2 F (37.3 C), resp. rate 16, height 5' 4"  (1.626 m), weight 76.7 kg, SpO2 97 %.Body mass index is 29.01 kg/m.  General Appearance: Improving  grooming  Eye Contact:  Good  Speech:  Normal Rate  Volume:  Normal  Mood:  Some improvement, but remains depressed and anxious-she does acknowledge improvement compared to admission  Affect:  Becoming less labile.  Not tearful at this time  Thought Process:  Linear and Descriptions of Associations: Intact  Orientation:  Full (Time, Place, and Person)  Thought Content:  denies hallucinations,no delusions, not internally preoccupied   Suicidal Thoughts:  No denies suicidal or self injurious ideations, denies homicidal or violent ideations  Homicidal Thoughts:  No  Memory:  recent and remote grossly intact   Judgement:  Other:  fair/improving  Insight:  Fair  Psychomotor Activity: No acute distress, no restlessness or agitation.  No significant tremors noted at this time  Concentration:  Concentration: Good and Attention Span: Good  Recall:  Good  Fund of Knowledge:  Good  Language:  Good  Akathisia:  Negative  Handed:  Right  AIMS (if indicated):     Assets:  Desire for Improvement Resilience  ADL's:  Intact  Cognition:  WNL  Sleep:  Number of Hours: 6   Assessment : 63 year old female, history of depression, anxiety, PTSD ( related to son's death, who passed away from an overdose several years ago). Presented to ED reporting worsening depression, worsening anxiety, suicidal ideations. Patient reports BZD doses had been increased recently, and was also taking more than prescribed at times, up to Ativan 7 mgrs daily prior to admission.  Patient reports some improvement compared to admission.  States that she was feeling better earlier today but ruminates that after arrival of  new roommate her anxiety worsened .  Compared to prior  presentation her affect seems less labile and her anxiety less severe.  She denies medication side effects, and states she feels that Remeron and Trileptal are helping her mood and anxiety symptoms.  She has reported that she feels Cymbalta is no longer working for her and we are tapering it off gradually. She has continued to express interest in tapering Ativan (was taking high doses prior to admission) but has developed increased anxiety/possible withdrawal symptoms when doses were decreased, so that we are proceeding with a slow/cautious taper.    Treatment Plan Summary:  Daily contact with patient to assess and evaluate symptoms and progress in treatment, Medication management, Plan inpatient treatment and medications as below  Treatment plan reviewed as below today 6/15.  Continue  Remeron  15  mgrs QHS for insomnia , depression, anxiety Continue  Ativan  1.5 mgrs QAM and 2 mgrs QHS - proceed with gradual BZD taper as tolerated  Continue Trileptal  300 mgrs QAM and 150 mgrs QHS for mood disorder Continue Metformin 1000 mgrs BID for DM  Continue ASA 81 mgrs QDAY  Continue Protonix 40 mgrs QDAY for GERD symptoms/gastric protection Continue Lipitor for hypercholesterolemia Continue Cymbalta  60 mgrs QDAY- consider gradual taper as she feels  this medication no longer effective. Treatment team working on disposition planning options She has expressed interest in exploring ECT as an option  Jenne Campus, MD 03/12/2019, 6:25 PM   Patient ID: [name], female   DOB: [birth year], 63 y.o.   MRN: [id number]

## 2019-03-13 MED ORDER — OXCARBAZEPINE 300 MG PO TABS
300.0000 mg | ORAL_TABLET | Freq: Two times a day (BID) | ORAL | Status: DC
  Administered 2019-03-13 – 2019-03-15 (×4): 300 mg via ORAL
  Filled 2019-03-13 (×8): qty 1

## 2019-03-13 MED ORDER — NORTRIPTYLINE HCL 10 MG PO CAPS
10.0000 mg | ORAL_CAPSULE | Freq: Every day | ORAL | Status: DC
  Administered 2019-03-13 – 2019-03-14 (×2): 10 mg via ORAL
  Filled 2019-03-13 (×4): qty 1

## 2019-03-13 MED ORDER — NORTRIPTYLINE HCL 25 MG PO CAPS
25.0000 mg | ORAL_CAPSULE | Freq: Every day | ORAL | Status: DC
  Administered 2019-03-13 – 2019-03-14 (×2): 25 mg via ORAL
  Filled 2019-03-13 (×4): qty 1

## 2019-03-13 MED ORDER — LORAZEPAM 0.5 MG PO TABS
1.5000 mg | ORAL_TABLET | ORAL | Status: DC
  Administered 2019-03-14 – 2019-03-15 (×2): 1.5 mg via ORAL
  Filled 2019-03-13 (×3): qty 3

## 2019-03-13 MED ORDER — MIRTAZAPINE 30 MG PO TABS
30.0000 mg | ORAL_TABLET | Freq: Every day | ORAL | Status: DC
  Filled 2019-03-13: qty 1

## 2019-03-13 NOTE — Plan of Care (Signed)
D: Patient is alert, anxious, and cooperative. Denies SI, HI, AVH, and verbally contracts for safety. Patient reports she is nervous about the suggestion of ECT therapy. Patient is not liking having a roommate because they cannot agree on the temperature. Patient denies physical symptoms/pain.    A: Medications administered per MD order. Support provided. Patient educated on safety on the unit and medications. Routine safety checks every 15 minutes. Patient stated understanding to tell nurse about any new physical symptoms. Patient understands to tell staff of any needs.     R: No adverse drug reactions noted. Patient verbally contracts for safety. Patient remains safe at this time and will continue to monitor.   Problem: Education: Goal: Mental status will improve Outcome: Progressing   Patient denies SI, HI, AVH, and contracts for safety. Patient remains safe and will continue to monitor.   [place] NOVEL CORONAVIRUS (COVID-19) DAILY CHECK-OFF SYMPTOMS - answer yes or no to each - every day NO YES  Have you had a fever in the past 24 hours?  Fever (Temp > 37.80C / 100F) X   Have you had any of these symptoms in the past 24 hours? New Cough  Sore Throat   Shortness of Breath  Difficulty Breathing  Unexplained Body Aches   X   Have you had any one of these symptoms in the past 24 hours not related to allergies?   Runny Nose  Nasal Congestion  Sneezing   X   If you have had runny nose, nasal congestion, sneezing in the past 24 hours, has it worsened?  X   EXPOSURES - check yes or no X   Have you traveled outside the state in the past 14 days?  X   Have you been in contact with someone with a confirmed diagnosis of COVID-19 or PUI in the past 14 days without wearing appropriate PPE?  X   Have you been living in the same home as a person with confirmed diagnosis of COVID-19 or a PUI (household contact)?    X   Have you been diagnosed with COVID-19?    X              What to do  next: Answered NO to all: Answered YES to anything:   Proceed with unit schedule Follow the [facility] Inpatient Flowsheet.

## 2019-03-13 NOTE — Progress Notes (Signed)
Did not attend group 

## 2019-03-13 NOTE — Progress Notes (Signed)
Spiritual care group on grief and loss facilitated by chaplain Jerene Pitch  Group Goal:  Support / Education around grief and loss Members engage in facilitated group support and psycho social education.  Group Description:  Following introductions and group rules,  Group members engaged in facilitated group dialog and support around topic of loss, with particular support around experiences of loss in their lives. Group Identified types of loss (relationships / self / things) and identified patterns, circumstances, and changes that precipitate losses. Reflected on thoughts / feelings around loss, normalized grief responses, and recognized variety in grief experience. Patient Progress:  Pt invited to group.  Did not attend

## 2019-03-13 NOTE — Progress Notes (Signed)
[facility] MD Progress Note  03/13/2019 2:08 PM Jeanette Rose  MRN:  [id number] Subjective: Patient is a 63 year old female with a past psychiatric history significant for posttraumatic stress disorder, generalized anxiety disorder, major depression as well as benzodiazepine dependence who was admitted on 03/04/2019 with suicidal ideation.  Objective: Patient is seen and examined.  Patient is a 63 year old female with the above-stated past psychiatric history who is seen in follow-up.  The patient is familiar to me from my last rotation when she was admitted by me on 03/04/2019.  Since her admission attempted continued wean of her Cymbalta, wean from the lorazepam, and titration of Remeron have taken place.  Also, consultation with Dr. Weber Cooks at [facility] for possible ECT has also been performed.  It was felt she would be a good candidate for ECT, but their facility has not had an open bed.  She comes in today tearful, agitated and essentially unchanged from admission.  She stated that she is having side effects from the increased dose of the Remeron at bedtime last night.  She stated she is biting her lip and other orofacial things.  She reiterated that she has been attempted to be treated with multiple medications in the past, and that the Cymbalta was the most beneficial for her which had the least amount of side effects.  Unfortunately on admission she was on 120 mg p.o. daily of Cymbalta, and this was not controlling any of her symptoms.  I communicated with Dr. Weber Cooks today, and he believes that a bed will not be available for ECT at least for 4 more days.  I discussed with the patient medications that she had been on previously, and she had apparently not been treated with try cyclic antidepressants in the past.  We discussed the possibility of either amitriptyline, nortriptyline or imipramine.  Given that nortriptyline probably had the least amount of side effects of these 3 medications we  decided to do a trial of nortriptyline.  She admitted to neurovegetative symptoms of depression and suicidal ideation and not desiring to live.  Her blood pressure and heart rate remain somewhat labile.  Her blood pressure today is 149/82, pulse was 123.  Her last CIWA this a.m. was 1.  She did sleep 6.75 hours last night.  Review of her laboratories revealed her blood sugar this a.m. to be 147.  Her hemoglobin A1c on admission was 6.3.  Drug screen was only positive for benzodiazepines.  Her EKG on admission showed a sinus tachycardia.  Principal Problem: <principal problem not specified> Diagnosis: Active Problems:   MDD (major depressive disorder), recurrent episode, severe ([place])  Total Time spent with patient: 30 minutes  Past Psychiatric History: See admission H&P  Past Medical History:  Past Medical History:  Diagnosis Date  . Acute acalculous cholecystitis s/p lap chole 04/03/2014 04/02/2014  . Allergy   . Anemia   . Anxiety   . Asthma   . Depression   . Fatty liver    noted on CT per [facility] records  . Fibromyalgia   . GERD (gastroesophageal reflux disease)   . Hyperlipidemia associated with type 2 diabetes mellitus ([place])   . Hypertension   . Hypothyroid 2015   Transiently in 2015. Synthroid 52mg was stopped 05/07/2016 w/ tsh nml following.  . Obesity (BMI 30-39.9)   . OSA (obstructive sleep apnea)    non-compliant with CPAP  . Seborrheic dermatitis    [facility]  . Stroke (  [place])    TIA  . Type 2 diabetes mellitus ([place])     Past Surgical History:  Procedure Laterality Date  . BREAST REDUCTION SURGERY  06/2008  . CHOLECYSTECTOMY N/A 04/03/2014   Procedure: LAPAROSCOPIC CHOLECYSTECTOMY ;  Surgeon: Adin Hector, MD;  Location: [facility] ORS;  Service: General;  Laterality: N/A;  . REDUCTION MAMMAPLASTY    . TUBAL LIGATION     Family History:  Family History  Problem Relation Age of Onset  . Atrial fibrillation Mother   . Heart disease  Father        CAD  . Diabetes Brother   . Cancer Maternal Grandmother   . Diabetes Paternal Grandmother   . Fibromyalgia Sister   . Colitis Sister    Family Psychiatric  History: See admission H&P Social History:  Social History   Substance and Sexual Activity  Alcohol Use No     Social History   Substance and Sexual Activity  Drug Use No    Social History   Socioeconomic History  . Marital status: Divorced    Spouse name: Not on file  . Number of children: Not on file  . Years of education: Not on file  . Highest education level: Not on file  Occupational History  . Not on file  Social Needs  . Financial resource strain: Not on file  . Food insecurity    Worry: Not on file    Inability: Not on file  . Transportation needs    Medical: Not on file    Non-medical: Not on file  Tobacco Use  . Smoking status: Former Smoker    Packs/day: 4.00    Years: 15.00    Pack years: 60.00    Types: Cigarettes    Quit date: 04/22/2004    Years since quitting: 14.8  . Smokeless tobacco: Never Used  . Tobacco comment: 4 pack year hx  Substance and Sexual Activity  . Alcohol use: No  . Drug use: No  . Sexual activity: Never  Lifestyle  . Physical activity    Days per week: Not on file    Minutes per session: Not on file  . Stress: Not on file  Relationships  . Social Herbalist on phone: Not on file    Gets together: Not on file    Attends religious service: Not on file    Active member of club or organization: Not on file    Attends meetings of clubs or organizations: Not on file    Relationship status: Not on file  Other Topics Concern  . Not on file  Social History Narrative  . Not on file   Additional Social History:    Pain Medications: see MAR Prescriptions: see MAR Over the Counter: see MAR History of alcohol / drug use?: No history of alcohol / drug abuse Longest period of sobriety (when/how long): no drug/alcohol/tobacco use Negative  Consequences of Use: Financial, Personal relationships Withdrawal Symptoms: Tremors                    Sleep: Good  Appetite:  Good  Current Medications: Current Facility-Administered Medications  Medication Dose Route Frequency Provider Last Rate Last Dose  . acetaminophen (TYLENOL) tablet 650 mg  650 mg Oral Q6H PRN Sharma Covert, MD      . alum & mag hydroxide-simeth (MAALOX/MYLANTA) 200-200-20 MG/5ML suspension 30 mL  30 mL Oral Q4H PRN Sharma Covert, MD      .  aspirin EC tablet 81 mg  81 mg Oral Daily Sharma Covert, MD   81 mg at 03/13/19 0754  . atorvastatin (LIPITOR) tablet 80 mg  80 mg Oral q1800 Sharma Covert, MD   80 mg at 03/12/19 1707  . DULoxetine (CYMBALTA) DR capsule 60 mg  60 mg Oral Daily Cobos, Myer Peer, MD   60 mg at 03/13/19 0754  . feeding supplement (ENSURE ENLIVE) (ENSURE ENLIVE) liquid 237 mL  237 mL Oral BID BM Sharma Covert, MD   237 mL at 03/12/19 1044  . hydrOXYzine (ATARAX/VISTARIL) tablet 25 mg  25 mg Oral TID PRN Sharma Covert, MD   25 mg at 03/12/19 1709  . irbesartan (AVAPRO) tablet 150 mg  150 mg Oral Daily Sharma Covert, MD   150 mg at 03/13/19 0755  . LORazepam (ATIVAN) tablet 1 mg  1 mg Oral Once Cobos, Fernando A, MD      . LORazepam (ATIVAN) tablet 1 mg  1 mg Oral Q8H PRN Cobos, Fernando A, MD      . LORazepam (ATIVAN) tablet 1.5 mg  1.5 mg Oral QHS Cobos, Myer Peer, MD   1.5 mg at 03/12/19 2122  . LORazepam (ATIVAN) tablet 2 mg  2 mg Oral BH-q7a Cobos, Myer Peer, MD   2 mg at 03/13/19 0620  . magnesium hydroxide (MILK OF MAGNESIA) suspension 30 mL  30 mL Oral Daily PRN Sharma Covert, MD   30 mL at 03/05/19 1759  . metFORMIN (GLUCOPHAGE-XR) 24 hr tablet 1,000 mg  1,000 mg Oral BID WC Sharma Covert, MD   1,000 mg at 03/13/19 0756  . multivitamin with minerals tablet 1 tablet  1 tablet Oral Daily Sharma Covert, MD   1 tablet at 03/13/19 0756  . nitrofurantoin (macrocrystal-monohydrate)  (MACROBID) capsule 100 mg  100 mg Oral Q12H Sharma Covert, MD   100 mg at 03/13/19 0756  . nortriptyline (PAMELOR) capsule 10 mg  10 mg Oral Daily Sharma Covert, MD   10 mg at 03/13/19 1158  . nortriptyline (PAMELOR) capsule 25 mg  25 mg Oral QHS Sharma Covert, MD      . OXcarbazepine (TRILEPTAL) tablet 150 mg  150 mg Oral QHS Cobos, Myer Peer, MD   150 mg at 03/12/19 2123  . Oxcarbazepine (TRILEPTAL) tablet 300 mg  300 mg Oral BH-q7a Cobos, Myer Peer, MD   300 mg at 03/13/19 0620  . pantoprazole (PROTONIX) EC tablet 40 mg  40 mg Oral BH-q7a Cobos, Myer Peer, MD   40 mg at 03/13/19 5277  . polyethylene glycol (MIRALAX / GLYCOLAX) packet 17 g  17 g Oral Daily PRN Connye Burkitt, NP      . thiamine (VITAMIN B-1) tablet 100 mg  100 mg Oral Daily Sharma Covert, MD   100 mg at 03/13/19 0756    Lab Results:  Results for orders placed or performed during the hospital encounter of 03/04/19 (from the past 48 hour(s))  Glucose, capillary     Status: Abnormal   Collection Time: 03/12/19  6:07 AM  Result Value Ref Range   Glucose-Capillary 147 (H) 70 - 99 mg/dL    Blood Alcohol level:  Lab Results  Component Value Date   ETH <10 82/42/3536    Metabolic Disorder Labs: Lab Results  Component Value Date   HGBA1C 6.3 (H) 03/09/2019   MPG 134.11 03/09/2019   MPG 180 (H) 04/03/2014   No results found for:  PROLACTIN Lab Results  Component Value Date   CHOL 174 10/09/2018   TRIG 210 (H) 10/09/2018   HDL 44 10/09/2018   CHOLHDL 4.0 10/09/2018   LDLCALC 88 10/09/2018   LDLCALC 77 06/28/2018    Physical Findings: AIMS: Facial and Oral Movements Muscles of Facial Expression: None, normal Lips and Perioral Area: None, normal Jaw: None, normal Tongue: None, normal,Extremity Movements Upper (arms, wrists, hands, fingers): None, normal Lower (legs, knees, ankles, toes): None, normal, Trunk Movements Neck, shoulders, hips: None, normal, Overall Severity Severity of  abnormal movements (highest score from questions above): None, normal Incapacitation due to abnormal movements: None, normal Patient's awareness of abnormal movements (rate only patient's report): No Awareness, Dental Status Current problems with teeth and/or dentures?: No Does patient usually wear dentures?: No  CIWA:  CIWA-Ar Total: 1 COWS:  COWS Total Score: 2  Musculoskeletal: Strength & Muscle Tone: within normal limits Gait & Station: normal Patient leans: N/A  Psychiatric Specialty Exam: Physical Exam  Nursing note and vitals reviewed. Constitutional: She is oriented to person, place, and time. She appears well-developed and well-nourished.  HENT:  Head: Normocephalic and atraumatic.  Respiratory: Effort normal.  Neurological: She is alert and oriented to person, place, and time.    ROS  Blood pressure (!) 149/82, pulse (!) 123, temperature 97.7 F (36.5 C), temperature source Oral, resp. rate 16, height 5' 4"  (1.626 m), weight 76.7 kg, SpO2 100 %.Body mass index is 29.01 kg/m.  General Appearance: Casual  Eye Contact:  Fair  Speech:  Pressured  Volume:  Increased  Mood:  Anxious, Depressed and Dysphoric  Affect:  Labile  Thought Process:  Coherent and Descriptions of Associations: Intact  Orientation:  Full (Time, Place, and Person)  Thought Content:  Logical  Suicidal Thoughts:  Yes.  without intent/plan  Homicidal Thoughts:  No  Memory:  Immediate;   Fair Recent;   Fair Remote;   Fair  Judgement:  Impaired  Insight:  Fair  Psychomotor Activity:  Increased  Concentration:  Concentration: Fair and Attention Span: Fair  Recall:  [employer] of Knowledge:  Fair  Language:  Good  Akathisia:  Negative  Handed:  Right  AIMS (if indicated):     Assets:  Desire for Improvement Resilience  ADL's:  Impaired  Cognition:  WNL  Sleep:  Number of Hours: 6.75     Treatment Plan Summary: Daily contact with patient to assess and evaluate symptoms and progress in  treatment, Medication management and Plan : Patient is seen and examined.  Patient is a 63 year old female with the above-stated past psychiatric history seen in follow-up.   Diagnosis: #1 major depression, recurrent, severe without psychotic features, #2 posttraumatic stress disorder, #3 personality disorder unspecified, #4 benzodiazepine dependence, #5 hypertension, #6 tachycardia, #7 hyperlipidemia, #8 generalized anxiety disorder, #9 GERD, #10 diabetes mellitus type 2  Patient is seen in follow-up.  Unfortunately she has not changed significantly since when I saw her on admission.  The addition of the mirtazapine, Trileptal as well as changing her duloxetine have not been of effective in terms of helping her depression.  The potential for ECT is still pending for later this week.  I discussed with patient the need to try other antidepressant medications to assist her in her treatment.  She has agreed to a trial of nortriptyline.  We will give her a test dose at 10 mg p.o. daily this morning, and start her on 25 mg p.o. nightly.  I will also increase her  Trileptal to 4 and 50 mg p.o. nightly since we are stopping the mirtazapine.  She has been on the [place] for over a week, and I will stop that today.  She also continues on lorazepam 1 mg p.o. every 8 hours as needed withdrawal symptoms, 1.5 mg p.o. nightly, and 2 mg p.o. daily.  I will decrease her morning dose to 1.5 mg and change that to daily and nightly.  Her blood sugar seems to be well-controlled on the Metformin so no changes there.  We will continue the pantoprazole as well as the MiraLAX. 1.  Continue coated aspirin 81 mg p.o. daily for heart health. 2.  Continue Lipitor 80 mg p.o. daily for hyperlipidemia. 3.  Continue Cymbalta 60 mg p.o. daily for mood and anxiety. 4.  Change lorazepam to 1 mg p.o. every 8 hours as needed anxiety and withdrawal symptoms as well as 1.5 mg p.o. daily and nightly for anxiety and insomnia. 5.  Continue  Metformin XR thousand milligrams p.o. twice daily for diabetes mellitus. 6.  Add nortriptyline 10 mg p.o. daily and 25 mg p.o. nightly for depression, anxiety and insomnia. 7.  Change Trileptal to 300 mg p.o. daily and 300 mg p.o. nightly.  This is for mood stability and sedation. 8.  Continue Protonix 40 mg p.o. daily for gastric protection. 9.  Continue thiamine 100 mg p.o. daily for nutritional supplementation. 10.  Awaiting bed availability for ECT treatment. 11.  Disposition planning-in progress.  Sharma Covert, MD 03/13/2019, 2:08 PM

## 2019-03-13 NOTE — Plan of Care (Signed)
Nurse discussed anxiety, depression and coping skills with patient.  

## 2019-03-13 NOTE — Progress Notes (Signed)
The patient's positive event for the day is that she had a good talk with her doctor regarding her medication. Her goal for tomorrow is to have another conversation with her doctor.

## 2019-03-13 NOTE — Progress Notes (Signed)
D:  Patient denied SI and HI, contracts for safety.  Denied A/V hallucinations.  Denied pain. A:  Medications administered per MD orders.  Emotional support and encouragement given patient. R:  Safety maintained with 15 minute checks.

## 2019-03-13 NOTE — Progress Notes (Signed)
The focus of this group is to help patients establish daily goals to achieve during treatment and discuss how the patient can incorporate goal setting into their daily lives to aide in recovery. 

## 2019-03-14 LAB — GLUCOSE, CAPILLARY: Glucose-Capillary: 149 mg/dL — ABNORMAL HIGH (ref 70–99)

## 2019-03-14 MED ORDER — NORTRIPTYLINE HCL 25 MG PO CAPS
25.0000 mg | ORAL_CAPSULE | Freq: Every day | ORAL | Status: DC
  Administered 2019-03-15: 08:00:00 25 mg via ORAL
  Filled 2019-03-14 (×3): qty 1

## 2019-03-14 MED ORDER — NORTRIPTYLINE HCL 10 MG PO CAPS
10.0000 mg | ORAL_CAPSULE | Freq: Once | ORAL | Status: AC
  Administered 2019-03-14: 10 mg via ORAL
  Filled 2019-03-14: qty 1

## 2019-03-14 NOTE — Plan of Care (Signed)
D: Patient is in the dayroom watching tv and not interacting on approach. Patient is alert, anxious, pleasant, and cooperative. Denies SI, HI, AVH, and verbally contracts for safety. Patient reports she is worried but trying to understand her treatment and make informed decisions about her care. She reports wanting to get better. Patient denies physical symptoms/pain.    A: Medications administered per MD order. Support provided. Patient educated on safety on the unit and medications. Routine safety checks every 15 minutes. Patient stated understanding to tell nurse about any new physical symptoms. Patient understands to tell staff of any needs.     R: No adverse drug reactions noted. Patient verbally contracts for safety. Patient remains safe at this time and will continue to monitor.   Problem: Education: Goal: Mental status will improve Outcome: Progressing Goal: Verbalization of understanding the information provided will improve Outcome: Progressing    Patient denies SI, HI, AVH, and contracts for safety. Patient is worried but trying to understand her treatment and make informed decisions about her care.   [place] NOVEL CORONAVIRUS (COVID-19) DAILY CHECK-OFF SYMPTOMS - answer yes or no to each - every day NO YES  Have you had a fever in the past 24 hours?  Fever (Temp > 37.80C / 100F) X   Have you had any of these symptoms in the past 24 hours? New Cough  Sore Throat   Shortness of Breath  Difficulty Breathing  Unexplained Body Aches   X   Have you had any one of these symptoms in the past 24 hours not related to allergies?   Runny Nose  Nasal Congestion  Sneezing   X   If you have had runny nose, nasal congestion, sneezing in the past 24 hours, has it worsened?  X   EXPOSURES - check yes or no X   Have you traveled outside the state in the past 14 days?  X   Have you been in contact with someone with a confirmed diagnosis of COVID-19 or PUI in the past 14 days without  wearing appropriate PPE?  X   Have you been living in the same home as a person with confirmed diagnosis of COVID-19 or a PUI (household contact)?    X   Have you been diagnosed with COVID-19?    X              What to do next: Answered NO to all: Answered YES to anything:   Proceed with unit schedule Follow the [facility] Inpatient Flowsheet.

## 2019-03-14 NOTE — Plan of Care (Signed)
Progress note  D: pt found in the hallway interacting with peers; compliant with medication administration. Pt denies any physical pain, but is still visibly anxious. Pt is reported to be leaving tomorrow and this is a stressor. Pt has been fixated on washing her clothes most of the day. Pt denies si/hi/ah/vh and verbally agrees to approach staff if these become apparent or before harming herself/others while at [place].  A: pt provided support and encouragement. Pt given medication per protocol and standing orders. Q23msafety checks implemented and continued.  R: pt safe on the unit. Will continue to monitor.   Pt progressing the following metrics  Problem: Activity: Goal: Interest or engagement in activities will improve Outcome: Progressing   Problem: Education: Goal: Ability to make informed decisions regarding treatment will improve Outcome: Progressing   Problem: Coping: Goal: Coping ability will improve Outcome: Progressing

## 2019-03-14 NOTE — Progress Notes (Signed)
[facility] MD Progress Note  03/14/2019 1:08 PM Jeanette Rose  MRN:  [id number] Subjective:  "I don't know what to do."  Jeanette Rose found sitting in the dayroom. She is anxious and tearful at times during assessment. She has many questions about ECT. Patient advised of transfer process to [facility], potential benefits of ECT and potential side effects of short-term memory loss, headache. After much discussion and later speaking with her sister on the phone, patient has decided she would like to pursue ECT. She was started on nortriptyline yesterday with Trileptal increased yesterday as well. No clear medication side effects. She does mention a tooth that feels "different" while eating but on further questioning she states this tooth has been bothering her for several weeks, but she noticed it more while eating yesterday. She continues to report anxious thoughts and ruminating about the future- "Can I ever get better?" Support and encouragement provided. She denies SI.   From admission H&P: Patient is a 63 year old female with a past psychiatric history significant for posttraumatic stress disorder, generalized anxiety disorder, major depression and benzodiazepine dependence who presented to the [facility] urgency department on 03/04/2019 with suicidal ideation. The patient is followed by Dr. Toy Care.Over the last several months they have been attempting to get her depression under control as well as her anxiety, but have not had a great deal of success.   Principal Problem: <principal problem not specified> Diagnosis: Active Problems:   MDD (major depressive disorder), recurrent episode, severe ([place])  Total Time spent with patient: 30 minutes  Past Psychiatric History: See admission H&P  Past Medical History:  Past Medical History:  Diagnosis Date  . Acute acalculous cholecystitis s/p lap chole 04/03/2014 04/02/2014  . Allergy   . Anemia   . Anxiety   . Asthma   . Depression   . Fatty liver    noted  on CT per [facility] records  . Fibromyalgia   . GERD (gastroesophageal reflux disease)   . Hyperlipidemia associated with type 2 diabetes mellitus ([place])   . Hypertension   . Hypothyroid 2015   Transiently in 2015. Synthroid 41mg was stopped 05/07/2016 w/ tsh nml following.  . Obesity (BMI 30-39.9)   . OSA (obstructive sleep apnea)    non-compliant with CPAP  . Seborrheic dermatitis    [facility]  . Stroke ([facility]    TIA  . Type 2 diabetes mellitus ([place]     Past Surgical History:  Procedure Laterality Date  . BREAST REDUCTION SURGERY  06/2008  . CHOLECYSTECTOMY N/A 04/03/2014   Procedure: LAPAROSCOPIC CHOLECYSTECTOMY ;  Surgeon: SAdin Hector MD;  Location: [facility] ORS;  Service: General;  Laterality: N/A;  . REDUCTION MAMMAPLASTY    . TUBAL LIGATION     Family History:  Family History  Problem Relation Age of Onset  . Atrial fibrillation Mother   . Heart disease Father        CAD  . Diabetes Brother   . Cancer Maternal Grandmother   . Diabetes Paternal Grandmother   . Fibromyalgia Sister   . Colitis Sister    Family Psychiatric  History: See admission H&P Social History:  Social History   Substance and Sexual Activity  Alcohol Use No     Social History   Substance and Sexual Activity  Drug Use No    Social History   Socioeconomic History  . Marital status: Divorced    Spouse name: Not on file  . Number  of children: Not on file  . Years of education: Not on file  . Highest education level: Not on file  Occupational History  . Not on file  Social Needs  . Financial resource strain: Not on file  . Food insecurity    Worry: Not on file    Inability: Not on file  . Transportation needs    Medical: Not on file    Non-medical: Not on file  Tobacco Use  . Smoking status: Former Smoker    Packs/day: 4.00    Years: 15.00    Pack years: 60.00    Types: Cigarettes    Quit date: 04/22/2004    Years since quitting: 14.9  .  Smokeless tobacco: Never Used  . Tobacco comment: 4 pack year hx  Substance and Sexual Activity  . Alcohol use: No  . Drug use: No  . Sexual activity: Never  Lifestyle  . Physical activity    Days per week: Not on file    Minutes per session: Not on file  . Stress: Not on file  Relationships  . Social Herbalist on phone: Not on file    Gets together: Not on file    Attends religious service: Not on file    Active member of club or organization: Not on file    Attends meetings of clubs or organizations: Not on file    Relationship status: Not on file  Other Topics Concern  . Not on file  Social History Narrative  . Not on file   Additional Social History:    Pain Medications: see MAR Prescriptions: see MAR Over the Counter: see MAR History of alcohol / drug use?: No history of alcohol / drug abuse Longest period of sobriety (when/how long): no drug/alcohol/tobacco use Negative Consequences of Use: Financial, Personal relationships Withdrawal Symptoms: Tremors                    Sleep: Good  Appetite:  Good  Current Medications: Current Facility-Administered Medications  Medication Dose Route Frequency Provider Last Rate Last Dose  . acetaminophen (TYLENOL) tablet 650 mg  650 mg Oral Q6H PRN Sharma Covert, MD   650 mg at 03/13/19 1813  . alum & mag hydroxide-simeth (MAALOX/MYLANTA) 200-200-20 MG/5ML suspension 30 mL  30 mL Oral Q4H PRN Sharma Covert, MD      . aspirin EC tablet 81 mg  81 mg Oral Daily Sharma Covert, MD   81 mg at 03/14/19 0756  . atorvastatin (LIPITOR) tablet 80 mg  80 mg Oral q1800 Sharma Covert, MD   80 mg at 03/13/19 1713  . DULoxetine (CYMBALTA) DR capsule 60 mg  60 mg Oral Daily Cobos, Myer Peer, MD   60 mg at 03/14/19 0756  . feeding supplement (ENSURE ENLIVE) (ENSURE ENLIVE) liquid 237 mL  237 mL Oral BID BM Sharma Covert, MD   237 mL at 03/12/19 1044  . hydrOXYzine (ATARAX/VISTARIL) tablet 25 mg  25 mg  Oral TID PRN Sharma Covert, MD   25 mg at 03/13/19 2106  . irbesartan (AVAPRO) tablet 150 mg  150 mg Oral Daily Sharma Covert, MD   150 mg at 03/14/19 0756  . LORazepam (ATIVAN) tablet 1 mg  1 mg Oral Once Cobos, Fernando A, MD      . LORazepam (ATIVAN) tablet 1 mg  1 mg Oral Q8H PRN Cobos, Myer Peer, MD      . LORazepam (ATIVAN)  tablet 1.5 mg  1.5 mg Oral QHS Cobos, Myer Peer, MD   1.5 mg at 03/13/19 2105  . LORazepam (ATIVAN) tablet 1.5 mg  1.5 mg Oral Liane Comber, MD   1.5 mg at 03/14/19 6384  . magnesium hydroxide (MILK OF MAGNESIA) suspension 30 mL  30 mL Oral Daily PRN Sharma Covert, MD   30 mL at 03/05/19 1759  . metFORMIN (GLUCOPHAGE-XR) 24 hr tablet 1,000 mg  1,000 mg Oral BID WC Sharma Covert, MD   1,000 mg at 03/14/19 0756  . multivitamin with minerals tablet 1 tablet  1 tablet Oral Daily Sharma Covert, MD   1 tablet at 03/14/19 0756  . nitrofurantoin (macrocrystal-monohydrate) (MACROBID) capsule 100 mg  100 mg Oral Q12H Sharma Covert, MD   100 mg at 03/14/19 0756  . nortriptyline (PAMELOR) capsule 25 mg  25 mg Oral QHS Sharma Covert, MD   25 mg at 03/13/19 2105  . [START ON 03/15/2019] nortriptyline (PAMELOR) capsule 25 mg  25 mg Oral Daily Connye Burkitt, NP      . Oxcarbazepine (TRILEPTAL) tablet 300 mg  300 mg Oral BID Sharma Covert, MD   300 mg at 03/14/19 0756  . pantoprazole (PROTONIX) EC tablet 40 mg  40 mg Oral BH-q7a Cobos, Myer Peer, MD   40 mg at 03/14/19 6659  . polyethylene glycol (MIRALAX / GLYCOLAX) packet 17 g  17 g Oral Daily PRN Connye Burkitt, NP      . thiamine (VITAMIN B-1) tablet 100 mg  100 mg Oral Daily Sharma Covert, MD   100 mg at 03/14/19 9357    Lab Results:  Results for orders placed or performed during the hospital encounter of 03/04/19 (from the past 48 hour(s))  Glucose, capillary     Status: Abnormal   Collection Time: 03/14/19  6:02 AM  Result Value Ref Range   Glucose-Capillary 149 (H)  70 - 99 mg/dL    Blood Alcohol level:  Lab Results  Component Value Date   ETH <10 01/77/9390    Metabolic Disorder Labs: Lab Results  Component Value Date   HGBA1C 6.3 (H) 03/09/2019   MPG 134.11 03/09/2019   MPG 180 (H) 04/03/2014   No results found for: PROLACTIN Lab Results  Component Value Date   CHOL 174 10/09/2018   TRIG 210 (H) 10/09/2018   HDL 44 10/09/2018   CHOLHDL 4.0 10/09/2018   LDLCALC 88 10/09/2018   LDLCALC 77 06/28/2018    Physical Findings: AIMS: Facial and Oral Movements Muscles of Facial Expression: None, normal Lips and Perioral Area: None, normal Jaw: None, normal Tongue: None, normal,Extremity Movements Upper (arms, wrists, hands, fingers): None, normal Lower (legs, knees, ankles, toes): None, normal, Trunk Movements Neck, shoulders, hips: None, normal, Overall Severity Severity of abnormal movements (highest score from questions above): None, normal Incapacitation due to abnormal movements: None, normal Patient's awareness of abnormal movements (rate only patient's report): No Awareness, Dental Status Current problems with teeth and/or dentures?: No Does patient usually wear dentures?: No  CIWA:  CIWA-Ar Total: 2 COWS:  COWS Total Score: 2  Musculoskeletal: Strength & Muscle Tone: within normal limits Gait & Station: normal Patient leans: N/A  Psychiatric Specialty Exam: Physical Exam  Nursing note and vitals reviewed. Constitutional: She is oriented to person, place, and time. She appears well-developed and well-nourished.  Cardiovascular: Normal rate.  Respiratory: Effort normal.  Neurological: She is alert and oriented to person, place, and time.  Review of Systems  Constitutional: Negative.   Psychiatric/Behavioral: Positive for depression. Negative for hallucinations, substance abuse and suicidal ideas. The patient is nervous/anxious. The patient does not have insomnia.     Blood pressure 137/75, pulse (!) 115, temperature  97.9 F (36.6 C), temperature source Oral, resp. rate 16, height 5' 4"  (1.626 m), weight 76.7 kg, SpO2 100 %.Body mass index is 29.01 kg/m.  General Appearance: Fairly Groomed  Eye Contact:  Fair  Speech:  Slow  Volume:  Decreased  Mood:  Anxious and Depressed  Affect:  Congruent and Tearful  Thought Process:  Coherent  Orientation:  Full (Time, Place, and Person)  Thought Content:  Rumination  Suicidal Thoughts:  Denies  Homicidal Thoughts:  Denies  Memory:  Immediate;   Fair Recent;   Fair  Judgement:  Fair  Insight:  Fair  Psychomotor Activity:  Normal  Concentration:  Concentration: Poor and Attention Span: Fair  Recall:  [employer] of Knowledge:  Fair  Language:  Fair  Akathisia:  No  Handed:  Right  AIMS (if indicated):     Assets:  Communication Skills Desire for Improvement Housing Social Support  ADL's:  Intact  Cognition:  WNL  Sleep:  Number of Hours: 6.5     Treatment Plan Summary: Daily contact with patient to assess and evaluate symptoms and progress in treatment and Medication management   Continue inpatient hospitalization.  Transfer patient to [facility] 03/15/19 for ECT Increase nortriptyline to 25 mg PO BID for anxiety/depression Continue Cymbalta 60 mg PO daily for anxiety/depression Continue Ativan 1.5 mg PO BID for anxiety and 1 mg PO Q8HR PRN withdrawal  Continue Vistaril 25 mg PO TID PRN anxiety Continue Trileptal 300 mg PO BID for mood/anxiety Continue Lipitor 80 mg PO daily for HLD Continue ASA 81 mg PO daily for antiplatelet Continue Avapro 150 mg PO daily for HTN Continue metformin 1000 mg PO BID for diabetes Continue Protonix 40 mg PO daily for GERD Continue thiamine 100 mg PO daily for supplementation  Patient will participate in the therapeutic group milieu.  Discharge disposition in progress.   Connye Burkitt, NP 03/14/2019, 1:08 PM

## 2019-03-14 NOTE — Progress Notes (Signed)
CSW spoke to [place] at [facility].  Pt is accepted for ECT but they do not know if they will have a bed tomorrow or not.  Will call TTS tomorrow to find out status: [phone]. Winferd Humphrey, MSW, LCSW Clinical Social Worker 03/14/2019 1:44 PM

## 2019-03-15 ENCOUNTER — Other Ambulatory Visit: Payer: Self-pay

## 2019-03-15 ENCOUNTER — Encounter: Payer: Self-pay | Admitting: Behavioral Health

## 2019-03-15 ENCOUNTER — Inpatient Hospital Stay: Payer: BLUE CROSS/BLUE SHIELD

## 2019-03-15 ENCOUNTER — Other Ambulatory Visit: Payer: Self-pay | Admitting: Psychiatry

## 2019-03-15 ENCOUNTER — Inpatient Hospital Stay
Admission: AD | Admit: 2019-03-15 | Discharge: 2019-03-30 | DRG: 885 | Disposition: A | Discharge status: Home / Self Care | Payer: BLUE CROSS/BLUE SHIELD | Source: Intra-hospital | Attending: Psychiatry | Admitting: Psychiatry

## 2019-03-15 DIAGNOSIS — Z7984 Long term (current) use of oral hypoglycemic drugs: Secondary | ICD-10-CM

## 2019-03-15 DIAGNOSIS — K5900 Constipation, unspecified: Secondary | ICD-10-CM | POA: Diagnosis present

## 2019-03-15 DIAGNOSIS — E1142 Type 2 diabetes mellitus with diabetic polyneuropathy: Secondary | ICD-10-CM

## 2019-03-15 DIAGNOSIS — E669 Obesity, unspecified: Secondary | ICD-10-CM | POA: Diagnosis present

## 2019-03-15 DIAGNOSIS — R200 Anesthesia of skin: Secondary | ICD-10-CM

## 2019-03-15 DIAGNOSIS — Z794 Long term (current) use of insulin: Secondary | ICD-10-CM | POA: Diagnosis not present

## 2019-03-15 DIAGNOSIS — Z87891 Personal history of nicotine dependence: Secondary | ICD-10-CM

## 2019-03-15 DIAGNOSIS — G4733 Obstructive sleep apnea (adult) (pediatric): Secondary | ICD-10-CM | POA: Diagnosis present

## 2019-03-15 DIAGNOSIS — G4700 Insomnia, unspecified: Secondary | ICD-10-CM | POA: Diagnosis present

## 2019-03-15 DIAGNOSIS — E785 Hyperlipidemia, unspecified: Secondary | ICD-10-CM | POA: Diagnosis present

## 2019-03-15 DIAGNOSIS — R45851 Suicidal ideations: Secondary | ICD-10-CM | POA: Diagnosis present

## 2019-03-15 DIAGNOSIS — Z818 Family history of other mental and behavioral disorders: Secondary | ICD-10-CM

## 2019-03-15 DIAGNOSIS — Z833 Family history of diabetes mellitus: Secondary | ICD-10-CM | POA: Diagnosis not present

## 2019-03-15 DIAGNOSIS — I10 Essential (primary) hypertension: Secondary | ICD-10-CM | POA: Diagnosis present

## 2019-03-15 DIAGNOSIS — Z809 Family history of malignant neoplasm, unspecified: Secondary | ICD-10-CM | POA: Diagnosis not present

## 2019-03-15 DIAGNOSIS — F32A Depression, unspecified: Secondary | ICD-10-CM | POA: Diagnosis present

## 2019-03-15 DIAGNOSIS — Z6828 Body mass index (BMI) 28.0-28.9, adult: Secondary | ICD-10-CM | POA: Diagnosis not present

## 2019-03-15 DIAGNOSIS — F411 Generalized anxiety disorder: Secondary | ICD-10-CM | POA: Diagnosis present

## 2019-03-15 DIAGNOSIS — E039 Hypothyroidism, unspecified: Secondary | ICD-10-CM | POA: Diagnosis present

## 2019-03-15 DIAGNOSIS — Z9049 Acquired absence of other specified parts of digestive tract: Secondary | ICD-10-CM | POA: Diagnosis not present

## 2019-03-15 DIAGNOSIS — Z8673 Personal history of transient ischemic attack (TIA), and cerebral infarction without residual deficits: Secondary | ICD-10-CM

## 2019-03-15 DIAGNOSIS — M797 Fibromyalgia: Secondary | ICD-10-CM | POA: Diagnosis present

## 2019-03-15 DIAGNOSIS — K760 Fatty (change of) liver, not elsewhere classified: Secondary | ICD-10-CM | POA: Diagnosis present

## 2019-03-15 DIAGNOSIS — Z888 Allergy status to other drugs, medicaments and biological substances status: Secondary | ICD-10-CM | POA: Diagnosis not present

## 2019-03-15 DIAGNOSIS — K219 Gastro-esophageal reflux disease without esophagitis: Secondary | ICD-10-CM | POA: Diagnosis present

## 2019-03-15 DIAGNOSIS — F1320 Sedative, hypnotic or anxiolytic dependence, uncomplicated: Secondary | ICD-10-CM | POA: Diagnosis not present

## 2019-03-15 DIAGNOSIS — K0530 Chronic periodontitis, unspecified: Secondary | ICD-10-CM | POA: Diagnosis present

## 2019-03-15 DIAGNOSIS — Z7982 Long term (current) use of aspirin: Secondary | ICD-10-CM

## 2019-03-15 DIAGNOSIS — Z8249 Family history of ischemic heart disease and other diseases of the circulatory system: Secondary | ICD-10-CM | POA: Diagnosis not present

## 2019-03-15 DIAGNOSIS — Z8639 Personal history of other endocrine, nutritional and metabolic disease: Secondary | ICD-10-CM

## 2019-03-15 DIAGNOSIS — Z79899 Other long term (current) drug therapy: Secondary | ICD-10-CM

## 2019-03-15 DIAGNOSIS — F4310 Post-traumatic stress disorder, unspecified: Secondary | ICD-10-CM | POA: Diagnosis present

## 2019-03-15 DIAGNOSIS — F329 Major depressive disorder, single episode, unspecified: Secondary | ICD-10-CM | POA: Diagnosis present

## 2019-03-15 DIAGNOSIS — F332 Major depressive disorder, recurrent severe without psychotic features: Principal | ICD-10-CM

## 2019-03-15 DIAGNOSIS — F419 Anxiety disorder, unspecified: Secondary | ICD-10-CM | POA: Diagnosis not present

## 2019-03-15 LAB — GLUCOSE, CAPILLARY
Glucose-Capillary: 180 mg/dL — ABNORMAL HIGH (ref 70–99)
Glucose-Capillary: 253 mg/dL — ABNORMAL HIGH (ref 70–99)

## 2019-03-15 MED ORDER — IRBESARTAN 150 MG PO TABS: 150.0000 mg | ORAL_TABLET | Freq: Every day | ORAL | 0 refills | Status: DC

## 2019-03-15 MED ORDER — PANTOPRAZOLE SODIUM 40 MG PO TBEC
40.0000 mg | DELAYED_RELEASE_TABLET | ORAL | Status: DC
  Administered 2019-03-16 – 2019-03-30 (×13): 40 mg via ORAL
  Filled 2019-03-15 (×15): qty 1

## 2019-03-15 MED ORDER — NORTRIPTYLINE HCL 25 MG PO CAPS: 25.0000 mg | ORAL_CAPSULE | Freq: Every day | ORAL | 0 refills | Status: DC

## 2019-03-15 MED ORDER — MAGNESIUM HYDROXIDE 400 MG/5ML PO SUSP
30.0000 mL | Freq: Every day | ORAL | Status: DC | PRN
  Administered 2019-03-24: 30 mL via ORAL
  Filled 2019-03-15: qty 30

## 2019-03-15 MED ORDER — OXCARBAZEPINE 300 MG PO TABS: 300.0000 mg | ORAL_TABLET | Freq: Two times a day (BID) | ORAL | 0 refills | Status: DC

## 2019-03-15 MED ORDER — THIAMINE HCL 100 MG PO TABS: 100.0000 mg | ORAL_TABLET | Freq: Every day | ORAL | 0 refills | Status: DC

## 2019-03-15 MED ORDER — DULOXETINE HCL 60 MG PO CPEP: 60.0000 mg | ORAL_CAPSULE | Freq: Every day | ORAL | 0 refills | Status: DC

## 2019-03-15 MED ORDER — ACETAMINOPHEN 325 MG PO TABS: 650.0000 mg | ORAL_TABLET | Freq: Four times a day (QID) | ORAL | Status: DC | PRN

## 2019-03-15 MED ORDER — LORAZEPAM 1 MG PO TABS: 1.0000 mg | ORAL_TABLET | Freq: Three times a day (TID) | ORAL | 0 refills | Status: DC | PRN

## 2019-03-15 MED ORDER — SENNOSIDES-DOCUSATE SODIUM 8.6-50 MG PO TABS
2.0000 | ORAL_TABLET | Freq: Every day | ORAL | Status: DC
  Administered 2019-03-15 – 2019-03-16 (×2): 2 via ORAL
  Filled 2019-03-15 (×3): qty 2

## 2019-03-15 MED ORDER — ASPIRIN EC 81 MG PO TBEC
81.0000 mg | DELAYED_RELEASE_TABLET | Freq: Every day | ORAL | Status: DC
  Administered 2019-03-16 – 2019-03-30 (×15): 81 mg via ORAL
  Filled 2019-03-15 (×14): qty 1

## 2019-03-15 MED ORDER — PANTOPRAZOLE SODIUM 40 MG PO TBEC: 40.0000 mg | DELAYED_RELEASE_TABLET | ORAL | 0 refills | Status: DC

## 2019-03-15 MED ORDER — METFORMIN HCL ER (OSM) 1000 MG PO TB24: 1000.0000 mg | ORAL_TABLET | Freq: Two times a day (BID) | ORAL | 0 refills | Status: DC

## 2019-03-15 MED ORDER — DULOXETINE HCL 30 MG PO CPEP
60.0000 mg | ORAL_CAPSULE | Freq: Every day | ORAL | Status: DC
  Administered 2019-03-16 – 2019-03-22 (×7): 60 mg via ORAL
  Filled 2019-03-15 (×7): qty 2

## 2019-03-15 MED ORDER — VITAMIN B-1 100 MG PO TABS
100.0000 mg | ORAL_TABLET | Freq: Every day | ORAL | Status: DC
  Administered 2019-03-16 – 2019-03-30 (×15): 100 mg via ORAL
  Filled 2019-03-15 (×15): qty 1

## 2019-03-15 MED ORDER — POLYETHYLENE GLYCOL 3350 17 G PO PACK: 17.0000 g | PACK | Freq: Every day | ORAL | 0 refills | Status: DC | PRN

## 2019-03-15 MED ORDER — MAGNESIUM HYDROXIDE 400 MG/5ML PO SUSP: 30.0000 mL | Freq: Every day | ORAL | Status: DC | PRN

## 2019-03-15 MED ORDER — MENTHOL 3 MG MT LOZG
1.0000 | LOZENGE | OROMUCOSAL | Status: AC | PRN
  Filled 2019-03-15: qty 9

## 2019-03-15 MED ORDER — HYDROXYZINE HCL 25 MG PO TABS
25.0000 mg | ORAL_TABLET | Freq: Three times a day (TID) | ORAL | Status: DC | PRN
  Administered 2019-03-15: 25 mg via ORAL
  Filled 2019-03-15: qty 1

## 2019-03-15 MED ORDER — NORTRIPTYLINE HCL 25 MG PO CAPS: 25.0000 mg | ORAL_CAPSULE | Freq: Every day | ORAL | Status: DC

## 2019-03-15 MED ORDER — ADULT MULTIVITAMIN W/MINERALS CH: 1.0000 | ORAL_TABLET | Freq: Every day | ORAL | 0 refills | Status: DC

## 2019-03-15 MED ORDER — HYDROXYZINE HCL 25 MG PO TABS: 25.0000 mg | ORAL_TABLET | Freq: Three times a day (TID) | ORAL | 0 refills | Status: DC | PRN

## 2019-03-15 MED ORDER — ACETAMINOPHEN 325 MG PO TABS
650.0000 mg | ORAL_TABLET | Freq: Four times a day (QID) | ORAL | Status: DC | PRN
  Administered 2019-03-21 – 2019-03-26 (×6): 650 mg via ORAL
  Filled 2019-03-15 (×7): qty 2

## 2019-03-15 MED ORDER — NITROFURANTOIN MONOHYD MACRO 100 MG PO CAPS
100.0000 mg | ORAL_CAPSULE | Freq: Two times a day (BID) | ORAL | Status: DC
  Filled 2019-03-15: qty 1

## 2019-03-15 MED ORDER — LORAZEPAM 1 MG PO TABS
1.5000 mg | ORAL_TABLET | ORAL | Status: DC
  Administered 2019-03-16 – 2019-03-18 (×3): 1.5 mg via ORAL
  Filled 2019-03-15 (×3): qty 1

## 2019-03-15 MED ORDER — LORAZEPAM 1 MG PO TABS
1.5000 mg | ORAL_TABLET | Freq: Every day | ORAL | Status: DC
  Administered 2019-03-15 – 2019-03-17 (×3): 1.5 mg via ORAL
  Filled 2019-03-15 (×3): qty 1

## 2019-03-15 MED ORDER — SERTRALINE HCL 25 MG PO TABS
25.0000 mg | ORAL_TABLET | Freq: Every day | ORAL | Status: DC
  Administered 2019-03-15 – 2019-03-18 (×3): 25 mg via ORAL
  Filled 2019-03-15 (×4): qty 1

## 2019-03-15 MED ORDER — METFORMIN HCL ER 500 MG PO TB24
1000.0000 mg | ORAL_TABLET | Freq: Two times a day (BID) | ORAL | Status: DC
  Administered 2019-03-15 – 2019-03-30 (×30): 1000 mg via ORAL
  Filled 2019-03-15 (×31): qty 2

## 2019-03-15 MED ORDER — NORTRIPTYLINE HCL 25 MG PO CAPS
25.0000 mg | ORAL_CAPSULE | Freq: Every day | ORAL | Status: DC
  Filled 2019-03-15: qty 1

## 2019-03-15 MED ORDER — LORAZEPAM 1 MG PO TABS
1.0000 mg | ORAL_TABLET | Freq: Three times a day (TID) | ORAL | Status: DC | PRN
  Administered 2019-03-16 – 2019-03-23 (×3): 1 mg via ORAL
  Filled 2019-03-15 (×3): qty 1

## 2019-03-15 MED ORDER — ADULT MULTIVITAMIN W/MINERALS CH
1.0000 | ORAL_TABLET | Freq: Every day | ORAL | Status: DC
  Administered 2019-03-16 – 2019-03-30 (×15): 1 via ORAL
  Filled 2019-03-15 (×15): qty 1

## 2019-03-15 MED ORDER — INSULIN ASPART 100 UNIT/ML ~~LOC~~ SOLN
0.0000 [IU] | Freq: Three times a day (TID) | SUBCUTANEOUS | Status: DC
  Administered 2019-03-15: 8 [IU] via SUBCUTANEOUS
  Administered 2019-03-16: 17:00:00 5 [IU] via SUBCUTANEOUS
  Administered 2019-03-17: 2 [IU] via SUBCUTANEOUS
  Administered 2019-03-17 – 2019-03-18 (×4): 3 [IU] via SUBCUTANEOUS
  Administered 2019-03-19: 5 [IU] via SUBCUTANEOUS
  Administered 2019-03-19 – 2019-03-20 (×2): 3 [IU] via SUBCUTANEOUS
  Administered 2019-03-20: 5 [IU] via SUBCUTANEOUS
  Administered 2019-03-20 – 2019-03-21 (×2): 3 [IU] via SUBCUTANEOUS
  Administered 2019-03-21: 5 [IU] via SUBCUTANEOUS
  Administered 2019-03-22 – 2019-03-25 (×10): 3 [IU] via SUBCUTANEOUS
  Administered 2019-03-25: 2 [IU] via SUBCUTANEOUS
  Administered 2019-03-26: 3 [IU] via SUBCUTANEOUS
  Administered 2019-03-26: 17:00:00 5 [IU] via SUBCUTANEOUS
  Administered 2019-03-27 (×2): 3 [IU] via SUBCUTANEOUS
  Administered 2019-03-27 – 2019-03-28 (×3): 2 [IU] via SUBCUTANEOUS
  Administered 2019-03-29: 15 [IU] via SUBCUTANEOUS
  Administered 2019-03-29 – 2019-03-30 (×2): 3 [IU] via SUBCUTANEOUS
  Filled 2019-03-15 (×30): qty 1

## 2019-03-15 MED ORDER — ENSURE ENLIVE PO LIQD: 237.0000 mL | Freq: Two times a day (BID) | ORAL | 0 refills | Status: DC

## 2019-03-15 MED ORDER — ENSURE ENLIVE PO LIQD
237.0000 mL | Freq: Two times a day (BID) | ORAL | Status: DC
  Administered 2019-03-17 – 2019-03-27 (×8): 237 mL via ORAL

## 2019-03-15 MED ORDER — LORAZEPAM 0.5 MG PO TABS: 1.5000 mg | ORAL_TABLET | Freq: Every day | ORAL | 0 refills | Status: DC

## 2019-03-15 MED ORDER — OXCARBAZEPINE 300 MG PO TABS: 300.0000 mg | ORAL_TABLET | Freq: Two times a day (BID) | ORAL | Status: DC

## 2019-03-15 MED ORDER — ALUM & MAG HYDROXIDE-SIMETH 200-200-20 MG/5ML PO SUSP: 30.0000 mL | ORAL | Status: DC | PRN

## 2019-03-15 MED ORDER — NITROFURANTOIN MONOHYD MACRO 100 MG PO CAPS: 100.0000 mg | ORAL_CAPSULE | Freq: Two times a day (BID) | ORAL | 0 refills | Status: DC

## 2019-03-15 MED ORDER — IRBESARTAN 150 MG PO TABS
150.0000 mg | ORAL_TABLET | Freq: Every day | ORAL | Status: DC
  Administered 2019-03-16 – 2019-03-30 (×14): 150 mg via ORAL
  Filled 2019-03-15 (×15): qty 1

## 2019-03-15 MED ORDER — POLYETHYLENE GLYCOL 3350 17 G PO PACK: 17.0000 g | PACK | Freq: Every day | ORAL | Status: DC | PRN

## 2019-03-15 MED ORDER — DOCUSATE SODIUM 100 MG PO CAPS
100.0000 mg | ORAL_CAPSULE | Freq: Two times a day (BID) | ORAL | Status: DC
  Administered 2019-03-15 – 2019-03-16 (×2): 100 mg via ORAL
  Filled 2019-03-15 (×2): qty 1

## 2019-03-15 MED ORDER — ATORVASTATIN CALCIUM 20 MG PO TABS
80.0000 mg | ORAL_TABLET | Freq: Every day | ORAL | Status: DC
  Administered 2019-03-15 – 2019-03-29 (×15): 80 mg via ORAL
  Filled 2019-03-15 (×14): qty 4

## 2019-03-15 NOTE — BHH Group Notes (Signed)
Adult Psychoeducational Group Note  Date:  03/15/2019 Time:  11:21 AM  Group Topic/Focus:  Goals Group:   The focus of this group is to help patients establish daily goals to achieve during treatment and discuss how the patient can incorporate goal setting into their daily lives to aide in recovery.  Participation Level:  Active  Participation Quality:  Appropriate  Affect:  Appropriate  Cognitive:  Alert  Insight: Appropriate  Engagement in Group:  Engaged  Modes of Intervention:  Orientation  Additional Comments:  Pt goal for today is to focus on getting better.   Jeanette Rose 03/15/2019, 11:21 AM

## 2019-03-15 NOTE — BHH Suicide Risk Assessment (Signed)
[facility] Admission Suicide Risk Assessment   Nursing information obtained from:  Patient Demographic factors:  Living alone Current Mental Status:  Self-harm thoughts Loss Factors:  Loss of significant relationship, Decline in physical health Historical Factors:  Impulsivity Risk Reduction Factors:  Positive social support  Total Time spent with patient: 1 hour Principal Problem: Severe recurrent major depression without psychotic features ([place]) Diagnosis:  Principal Problem:   Severe recurrent major depression without psychotic features ([place]) Active Problems:   Type 2 diabetes mellitus with diabetic polyneuropathy, with long-term current use of insulin (HCC)   GERD (gastroesophageal reflux disease)   Hypertension   Obesity (BMI 30-39.9)   Constipation   History of hypothyroidism   Major depression  Subjective Data: Patient seen and chart reviewed.  Patient with severe major depression recurrent poor response to medication recent suicidal ideation.  Transferred from [place].  Patient denies acute suicidal intent.  Admits to severe depression and anxiety.  Denies psychotic symptoms.  She is agreeable to treatment planning and cooperative.  Continued Clinical Symptoms:  Alcohol Use Disorder Identification Test Final Score (AUDIT): 0 The "Alcohol Use Disorders Identification Test", Guidelines for Use in Primary Care, Second Edition.  World Pharmacologist [facility]). Score between 0-7:  no or low risk or alcohol related problems. Score between 8-15:  moderate risk of alcohol related problems. Score between 16-19:  high risk of alcohol related problems. Score 20 or above:  warrants further diagnostic evaluation for alcohol dependence and treatment.   CLINICAL FACTORS:   Severe Anxiety and/or Agitation Depression:   Hopelessness   Musculoskeletal: Strength & Muscle Tone: within normal limits Gait & Station: normal Patient leans: N/A  Psychiatric Specialty Exam: Physical Exam   Nursing note and vitals reviewed. Constitutional: She appears well-developed and well-nourished.  HENT:  Head: Normocephalic and atraumatic.  Eyes: Pupils are equal, round, and reactive to light. Conjunctivae are normal.  Neck: Normal range of motion.  Cardiovascular: Regular rhythm and normal heart sounds.  Respiratory: Effort normal.  GI: Soft.  Musculoskeletal: Normal range of motion.  Neurological: She is alert.  Skin: Skin is warm and dry.  Psychiatric: Judgment normal. Her mood appears anxious. Her speech is delayed. She is slowed. Thought content is not paranoid. Cognition and memory are impaired. She exhibits a depressed mood. She expresses no homicidal and no suicidal ideation.    Review of Systems  Constitutional: Negative.   HENT: Negative.   Eyes: Negative.   Respiratory: Negative.   Cardiovascular: Negative.   Gastrointestinal: Negative.   Musculoskeletal: Negative.   Skin: Negative.   Neurological: Negative.   Psychiatric/Behavioral: Positive for depression and suicidal ideas. Negative for hallucinations and substance abuse. The patient is nervous/anxious and has insomnia.     Blood pressure (!) 179/94, pulse (!) 124, temperature 98 F (36.7 C), temperature source Oral, resp. rate 18, height 5' 4"  (1.626 m), weight 76.7 kg, SpO2 99 %.Body mass index is 29.01 kg/m.  General Appearance: Casual  Eye Contact:  Fair  Speech:  Clear and Coherent  Volume:  Decreased  Mood:  Anxious and Depressed  Affect:  Congruent  Thought Process:  Coherent  Orientation:  Full (Time, Place, and Person)  Thought Content:  Rumination and Tangential  Suicidal Thoughts:  Yes.  without intent/plan  Homicidal Thoughts:  No  Memory:  Immediate;   Fair Recent;   Fair Remote;   Fair  Judgement:  Fair  Insight:  Fair  Psychomotor Activity:  Restlessness  Concentration:  Concentration: Fair  Recall:  Fair  Fund of Knowledge:  Fair  Language:  Fair  Akathisia:  No  Handed:  Right   AIMS (if indicated):     Assets:  Desire for Improvement Housing Social Support  ADL's:  Impaired  Cognition:  Impaired,  Mild  Sleep:         COGNITIVE FEATURES THAT CONTRIBUTE TO RISK:  Closed-mindedness    SUICIDE RISK:   Mild:  Suicidal ideation of limited frequency, intensity, duration, and specificity.  There are no identifiable plans, no associated intent, mild dysphoria and related symptoms, good self-control (both objective and subjective assessment), few other risk factors, and identifiable protective factors, including available and accessible social support.  PLAN OF CARE: Continue 15-minute checks.  Continue antidepressant medication and adjust as necessary.  Proceed with full investigation and work-up by treatment team.  Patient is agreeable to ECT and will begin right unilateral treatment tomorrow morning.  We will work on making sure her suicide is reassessed and there is good outpatient treatment in place prior to discharge  I certify that inpatient services furnished can reasonably be expected to improve the patient's condition.   Alethia Berthold, MD 03/15/2019, 5:56 PM

## 2019-03-15 NOTE — Tx Team (Signed)
Initial Treatment Plan 03/15/2019 5:23 PM Rande Dario EYE:[id number]    PATIENT STRESSORS: Health problems Medication change or noncompliance   PATIENT STRENGTHS: Ability for insight Average or above average intelligence Capable of independent living Communication skills Supportive family/friends   PATIENT IDENTIFIED PROBLEMS: Depression  03/15/2019  Anxiety 03/15/2019  Grief  03/15/2019                 DISCHARGE CRITERIA:  Ability to meet basic life and health needs Improved stabilization in mood, thinking, and/or behavior Medical problems require only outpatient monitoring  PRELIMINARY DISCHARGE PLAN: Outpatient therapy Return to previous living arrangement  PATIENT/FAMILY INVOLVEMENT: This treatment plan has been presented to and reviewed with the patient, Jeanette Rose, and/or family member,   .  The patient and family have been given the opportunity to ask questions and make suggestions.  Leodis Liverpool, RN 03/15/2019, 5:23 PM

## 2019-03-15 NOTE — BH Assessment (Addendum)
Patient has been accepted to [facility].  Accepting physician is Dr. Weber Cooks.  Attending Physician will be Dr. Weber Cooks.  Patient has been assigned to room 302, by [place].  Call report to [phone].  Representative/Transfer Coordinator is Print production planner [facility] TTS)   [place] Staff Belenda Cruise, [place]) made aware of acceptance.   Per Copywriter, advertising, pt can be arrive after 12:00pm

## 2019-03-15 NOTE — Progress Notes (Signed)
Patient discharged to [facility] and report was called to that hospital.   Patient received all her belongings, clothing, toiletries, misc.  Patient denied SI and HI.  Denied A/V hallucinations.  Patient received all her required information at discharge.

## 2019-03-15 NOTE — BHH Suicide Risk Assessment (Signed)
[facility] Discharge Suicide Risk Assessment   Principal Problem: <principal problem not specified> Discharge Diagnoses: Active Problems:   MDD (major depressive disorder), recurrent episode, severe ([place])   Total Time spent with patient: 30 minutes  Musculoskeletal: Strength & Muscle Tone: within normal limits Gait & Station: normal Patient leans: N/A  Psychiatric Specialty Exam: Review of Systems  All other systems reviewed and are negative.   Blood pressure 137/80, pulse (!) 123, temperature 97.9 F (36.6 C), temperature source Oral, resp. rate 18, height 5' 4"  (1.626 m), weight 76.7 kg, SpO2 100 %.Body mass index is 29.01 kg/m.  General Appearance: Casual  Eye Contact::  Fair  Speech:  Normal Rate409  Volume:  Decreased  Mood:  Anxious and Depressed  Affect:  Congruent  Thought Process:  Coherent and Descriptions of Associations: Intact  Orientation:  Full (Time, Place, and Person)  Thought Content:  Rumination  Suicidal Thoughts:  Yes.  without intent/plan  Homicidal Thoughts:  No  Memory:  Immediate;   Fair Recent;   Fair Remote;   Fair  Judgement:  Intact  Insight:  Fair  Psychomotor Activity:  Increased  Concentration:  Fair  Recall:  Good  Fund of Knowledge:Good  Language: Good  Akathisia:  Negative  Handed:  Right  AIMS (if indicated):     Assets:  Desire for Improvement Resilience  Sleep:  Number of Hours: 6.75  Cognition: WNL  ADL's:  Intact   Mental Status Per Nursing Assessment::   On Admission:  Self-harm thoughts, Suicidal ideation indicated by patient  Demographic Factors:  Divorced or widowed, Caucasian, Low socioeconomic status, Living alone and Unemployed  Loss Factors: Loss of significant relationship  Historical Factors: Impulsivity  Risk Reduction Factors:   Positive social support  Continued Clinical Symptoms:  Severe Anxiety and/or Agitation Depression:   Anhedonia Hopelessness Impulsivity Insomnia  Cognitive Features That  Contribute To Risk:  Polarized thinking    Suicide Risk:  Moderate:  Frequent suicidal ideation with limited intensity, and duration, some specificity in terms of plans, no associated intent, good self-control, limited dysphoria/symptomatology, some risk factors present, and identifiable protective factors, including available and accessible social support.  Follow-up Information    [facility] Follow up on 03/15/2019.   Specialty: [facility] information: [place] [phone] [address] [place] [phone]          Plan Of Care/Follow-up recommendations:  Activity:  ad lib  Sharma Covert, MD 03/15/2019, 9:22 AM

## 2019-03-15 NOTE — Progress Notes (Signed)
  [facility] Adult Case Management Discharge Plan :  Will you be returning to the same living situation after discharge:  No. Discharging to [facility] At discharge, do you have transportation home?: Yes,  [place] Do you have the ability to pay for your medications: Yes,  BC[employer]  Release of information consent forms completed and in the chart.   Patient to Follow up at: Follow-up Information    [facility] Follow up on 03/15/2019.   Specialty: Behavioral Health Why: You have been accepted for ECT for Thursday, 03/15/2019.  Contact information: [place] [phone] ar [place] [phone]          Next level of care provider has access to [place] and Suicide Prevention discussed: Yes,  with sister  Have you used any form of tobacco in the last 30 days? (Cigarettes, Smokeless Tobacco, Cigars, and/or Pipes): No  Has patient been referred to the Quitline?: Patient refused referral  Patient has been referred for addiction treatment: Yes  Joellen Jersey, [place] 03/15/2019, 10:28 AM

## 2019-03-15 NOTE — Progress Notes (Signed)
Patient ID: Jeanette Rose, female   DOB: May 02, 1956, 63 y.o.   MRN: [id number] Patient transferred to [facility] for continued psychiatric care.  Patient acknowledged receipt of all personal belongings at transfer.

## 2019-03-15 NOTE — Plan of Care (Signed)
  Problem: Education: Goal: Knowledge of [place] [facility] Education information/materials will improve Note: Instructed on [employer] , unit programing , able to verbalize understanding.

## 2019-03-15 NOTE — Progress Notes (Signed)
Admission Note:  D: Pt appeared depressed  With  a flat affect.  Pt  Voice  Of being suicidal at this time.  Observed being very nervous with increased anxiety . Patient stated they exhausted all  Possible  Medication  To correct her depression . Stated she is constantly crying  And doesn't  have any feeling or energy  for anything .  Continue to voice of son  Death  eight and one half years ago  Who died of a overdose. Patient dressed in scrubs  Very concern about her sister picking  Her up . Instructed patient we  Will make sure she get home after treatment   Is concluded . Pt is redirectable and cooperative with assessment.  Past Medical history    Anemia, Anxiety Depression  GERD, Hypertension  Hypothyroid,  Stroke   Type 2 Diabetes    A: Pt admitted to unit per protocol, skin assessment and search done and no contraband found.  Pt  educated on therapeutic milieu rules. Pt was introduced to milieu by nursing staff.    R: Pt was receptive to education about the milieu .  15 min safety checks started. Probation officer offered support

## 2019-03-15 NOTE — Discharge Summary (Signed)
Physician Discharge Summary Note  Patient:  Jeanette Rose is an 63 y.o., female MRN:  [id number] DOB:  May 12, 1956 Patient phone:  [phone] (home)  Patient address:   [place] [address],  Total Time spent with patient: 15 minutes  Date of Admission:  03/04/2019 Date of Discharge: 03/15/19  Reason for Admission:  suicidal ideation  Principal Problem: <principal problem not specified> Discharge Diagnoses: Active Problems:   MDD (major depressive disorder), recurrent episode, severe ([place])   Past Psychiatric History: Per admission H&P: Patient denied any previous psychiatric admissions.  She has had depression for many years.  She has had posttraumatic stress disorder for many years.  She is followed by Dr. Toy Care locally.  She has been on multiple medications without great success recently.  Dr. Toy Care has suggested possible ECT.  Past Medical History:  Past Medical History:  Diagnosis Date  . Acute acalculous cholecystitis s/p lap chole 04/03/2014 04/02/2014  . Allergy   . Anemia   . Anxiety   . Asthma   . Depression   . Fatty liver    noted on CT per [facility] records  . Fibromyalgia   . GERD (gastroesophageal reflux disease)   . Hyperlipidemia associated with type 2 diabetes mellitus ([place])   . Hypertension   . Hypothyroid 2015   Transiently in 2015. Synthroid 40mg was stopped 05/07/2016 w/ tsh nml following.  . Obesity (BMI 30-39.9)   . OSA (obstructive sleep apnea)    non-compliant with CPAP  . Seborrheic dermatitis    [facility]  . Stroke ([facility]    TIA  . Type 2 diabetes mellitus ([place]     Past Surgical History:  Procedure Laterality Date  . BREAST REDUCTION SURGERY  06/2008  . CHOLECYSTECTOMY N/A 04/03/2014   Procedure: LAPAROSCOPIC CHOLECYSTECTOMY ;  Surgeon: SAdin Hector MD;  Location: [facility] ORS;  Service: General;  Laterality: N/A;  . REDUCTION MAMMAPLASTY    . TUBAL LIGATION     Family History:  Family History   Problem Relation Age of Onset  . Atrial fibrillation Mother   . Heart disease Father        CAD  . Diabetes Brother   . Cancer Maternal Grandmother   . Diabetes Paternal Grandmother   . Fibromyalgia Sister   . Colitis Sister    Family Psychiatric  History: Denies Social History:  Social History   Substance and Sexual Activity  Alcohol Use No     Social History   Substance and Sexual Activity  Drug Use No    Social History   Socioeconomic History  . Marital status: Divorced    Spouse name: Not on file  . Number of children: Not on file  . Years of education: Not on file  . Highest education level: Not on file  Occupational History  . Not on file  Social Needs  . Financial resource strain: Not on file  . Food insecurity    Worry: Not on file    Inability: Not on file  . Transportation needs    Medical: Not on file    Non-medical: Not on file  Tobacco Use  . Smoking status: Former Smoker    Packs/day: 4.00    Years: 15.00    Pack years: 60.00    Types: Cigarettes    Quit date: 04/22/2004    Years since quitting: 14.9  . Smokeless tobacco: Never Used  . Tobacco comment: 4 pack year hx  Substance and Sexual Activity  . Alcohol use: No  . Drug use: No  . Sexual activity: Never  Lifestyle  . Physical activity    Days per week: Not on file    Minutes per session: Not on file  . Stress: Not on file  Relationships  . Social Herbalist on phone: Not on file    Gets together: Not on file    Attends religious service: Not on file    Active member of club or organization: Not on file    Attends meetings of clubs or organizations: Not on file    Relationship status: Not on file  Other Topics Concern  . Not on file  Social History Narrative  . Not on file    Hospital Course:  From admission H&P: Patient is a 63 year old female with a past psychiatric history significant for posttraumatic stress disorder, generalized anxiety disorder, major  depression and benzodiazepine dependence who presented to the [facility] urgency department on 03/04/2019 with suicidal ideation. The patient is followed by Dr. Toy Care.Over the last several months they have been attempting to get her depression under control as well as her anxiety, but have not had a great deal of success. Recently Dr. Randall An her lorazepam dosage from 2 mg twice a day to a total of 5 to 6 mg a day. The patient stated that she has had a terrible time with anxiety since her son died several years ago. She stated that she found her son dead, and turned him over and saw that his face was blue. She is not gotten over that. She is currently on disability for posttraumatic stress disorder. Since the coronavirus issue started she has been sequestered pretty much in her home, and none of her sons possessions have been removed from the home since his death. It is a reminder every day of what occurred. She admitted that she had been overusing the benzodiazepines, and that now she was becoming agitated with them and it was not working with her anxiety. She has been on multiple antidepressants in the past. She is on 120 mg of Cymbalta once a day and they have attempted to augment that with Abilify and other medications without success. Approximately a month ago Dr. Toy Care recommended consideration of ECT. She is frightened by that. She also stated another additional psychosocial stressor is that her relationship with her daughter is not very good, and she does not feel supported by her. She admitted to helplessness, hopelessness and worthlessness. She was crying and tearful throughout the interview. She was admitted to the hospital for evaluation and stabilization.  Ms. Masley was admitted for depression with suicidal ideation. She remained on the [facility] unit for 11 days. Ativan was decreased to 1.5 mg PO BID, with 1 mg PO Q8HR PRN CIWA>10. Cymbalta was continued. Remeron was started  but discontinued due to patient's reported side effect of lip biting. Trileptal was started for mood stabilization and anxiety. Nortriptyline was started. Patient has continuing severe depression and anxiety with history of multiple prior medication trials. She is recommended for ECT. She agrees to transfer to [facility] for continued treatment with ECT. She is transferring to [facility] via [place] with [facility] technician.  Physical Findings: AIMS: Facial and Oral Movements Muscles of Facial Expression: None, normal Lips and Perioral Area: None, normal Jaw: None, normal Tongue: None, normal,Extremity Movements Upper (arms, wrists, hands, fingers): None, normal Lower (legs, knees, ankles, toes): None, normal, Trunk Movements Neck, shoulders, hips:  None, normal, Overall Severity Severity of abnormal movements (highest score from questions above): None, normal Incapacitation due to abnormal movements: None, normal Patient's awareness of abnormal movements (rate only patient's report): No Awareness, Dental Status Current problems with teeth and/or dentures?: No Does patient usually wear dentures?: No  CIWA:  CIWA-Ar Total: 3 COWS:  COWS Total Score: 2  Musculoskeletal: Strength & Muscle Tone: within normal limits Gait & Station: normal Patient leans: N/A  Psychiatric Specialty Exam: Physical Exam  Nursing note and vitals reviewed. Constitutional: She is oriented to person, place, and time. She appears well-developed and well-nourished.  Respiratory: Effort normal.  Neurological: She is alert and oriented to person, place, and time.    Review of Systems  Constitutional: Negative.   Respiratory: Negative for cough and shortness of breath.   Cardiovascular: Negative for chest pain.  Psychiatric/Behavioral: Positive for depression. Negative for hallucinations, substance abuse and suicidal ideas. The patient is nervous/anxious. The patient does not have insomnia.     Blood pressure 137/80, pulse (!) 123,  temperature 97.9 F (36.6 C), temperature source Oral, resp. rate 18, height 5' 4"  (1.626 m), weight 76.7 kg, SpO2 100 %.Body mass index is 29.01 kg/m.  See MD's discharge SRA     Have you used any form of tobacco in the last 30 days? (Cigarettes, Smokeless Tobacco, Cigars, and/or Pipes): No  Has this patient used any form of tobacco in the last 30 days? (Cigarettes, Smokeless Tobacco, Cigars, and/or Pipes)  No  Blood Alcohol level:  Lab Results  Component Value Date   ETH <10 46/80/3212    Metabolic Disorder Labs:  Lab Results  Component Value Date   HGBA1C 6.3 (H) 03/09/2019   MPG 134.11 03/09/2019   MPG 180 (H) 04/03/2014   No results found for: PROLACTIN Lab Results  Component Value Date   CHOL 174 10/09/2018   TRIG 210 (H) 10/09/2018   HDL 44 10/09/2018   CHOLHDL 4.0 10/09/2018   [place] 88 10/09/2018   [place] 77 06/28/2018    See Psychiatric Specialty Exam and Suicide Risk Assessment completed by Attending Physician prior to discharge.  Discharge destination:  Other:  [place]  Is patient on multiple antipsychotic therapies at discharge:  No   Has Patient had three or more failed trials of antipsychotic monotherapy by history:  No  Recommended Plan for Multiple Antipsychotic Therapies: NA   Allergies as of 03/15/2019      Reactions   Farxiga [dapagliflozin] Anxiety, Other (See Comments)   Made depression and anxiety worse   Rexulti [brexpiprazole] Anxiety, Other (See Comments)   Made anxiety worse   Trintellix [vortioxetine] Anxiety, Other (See Comments)   Made anxiety worse      Medication List    STOP taking these medications   amantadine 100 MG capsule Commonly known as: SYMMETREL   Dulaglutide 1.5 MG/0.5ML Sopn Commonly known as: Trulicity   freestyle lancets   linaclotide 145 MCG Caps capsule Commonly known as: Linzess   olmesartan 20 MG tablet Commonly known as: BENICAR   omeprazole 40 MG capsule Commonly known as: PRILOSEC    polyethylene glycol powder 17 GM/SCOOP powder Commonly known as: GLYCOLAX/MIRALAX Replaced by: polyethylene glycol 17 g packet   Toujeo Max SoloStar 300 UNIT/ML Sopn Generic drug: Insulin Glargine (2 Unit Dial)   VITAMIN B-12 CR PO   Vitamin D (Ergocalciferol) 1.25 MG (50000 UT) Caps capsule Commonly known as: DRISDOL   VITAMIN D-3 PO     TAKE these medications  Indication  Aspirin Low Dose 81 MG tablet Generic drug: aspirin Take 81 mg by mouth daily.  Indication: antiplatelet   atorvastatin 80 MG tablet Commonly known as: LIPITOR TAKE 1 TABLET (80 MG TOTAL) BY MOUTH DAILY AT 6 PM.  Indication: High Amount of Fats in the Blood   DULoxetine 60 MG capsule Commonly known as: CYMBALTA Take 1 capsule (60 mg total) by mouth daily. Start taking on: March 16, 2019 What changed:   how much to take  when to take this  Indication: Major Depressive Disorder   feeding supplement (ENSURE ENLIVE) Liqd Take 237 mLs by mouth 2 (two) times daily between meals.  Indication: Supplementation   hydrOXYzine 25 MG tablet Commonly known as: ATARAX/VISTARIL Take 1 tablet (25 mg total) by mouth 3 (three) times daily as needed for anxiety.  Indication: Feeling Anxious   irbesartan 150 MG tablet Commonly known as: AVAPRO Take 1 tablet (150 mg total) by mouth daily. Start taking on: March 16, 2019  Indication: High Blood Pressure Disorder   LORazepam 0.5 MG tablet Commonly known as: ATIVAN Take 3 tablets (1.5 mg total) by mouth at bedtime. What changed:   medication strength  how much to take  when to take this  additional instructions  Indication: Feeling Anxious   LORazepam 1 MG tablet Commonly known as: ATIVAN Take 1 tablet (1 mg total) by mouth every 8 (eight) hours as needed (withdrawal symptoms). What changed: You were already taking a medication with the same name, and this prescription was added. Make sure you understand how and when to take each.  Indication:  Feeling Anxious   metformin 1000 MG (OSM) 24 hr tablet Commonly known as: FORTAMET Take 1 tablet (1,000 mg total) by mouth 2 (two) times daily with a meal. What changed: medication strength  Indication: Type 2 Diabetes   multivitamin with minerals Tabs tablet Take 1 tablet by mouth daily. Start taking on: March 16, 2019  Indication: Supplementation   nitrofurantoin (macrocrystal-monohydrate) 100 MG capsule Commonly known as: MACROBID Take 1 capsule (100 mg total) by mouth every 12 (twelve) hours. What changed:   when to take this  additional instructions  Indication: Urinary Tract Infection   nortriptyline 25 MG capsule Commonly known as: PAMELOR Take 1 capsule (25 mg total) by mouth at bedtime.  Indication: Depression   nortriptyline 25 MG capsule Commonly known as: PAMELOR Take 1 capsule (25 mg total) by mouth daily. Start taking on: March 16, 2019  Indication: Depression   Oxcarbazepine 300 MG tablet Commonly known as: TRILEPTAL Take 1 tablet (300 mg total) by mouth 2 (two) times daily.  Indication: Mood   pantoprazole 40 MG tablet Commonly known as: PROTONIX Take 1 tablet (40 mg total) by mouth every morning. Start taking on: March 16, 2019  Indication: Gastroesophageal Reflux Disease   polyethylene glycol 17 g packet Commonly known as: MIRALAX / GLYCOLAX Take 17 g by mouth daily as needed for mild constipation or moderate constipation. Replaces: polyethylene glycol powder 17 GM/SCOOP powder  Indication: Constipation   thiamine 100 MG tablet Take 1 tablet (100 mg total) by mouth daily. Start taking on: March 16, 2019  Indication: Supplementation      Follow-up Information    [facility] INPATIENT BEHAVIORAL MEDICINE Follow up on 03/15/2019.   Specialty: [facility] information: [place] [phone] [place] [phone]          Follow-up recommendations:  Discharging to [facility] for ECT  Comments:   n/a  Signed: Connye Burkitt, NP 03/15/2019, 9:41 AM

## 2019-03-15 NOTE — H&P (Signed)
Psychiatric Admission Assessment Adult  Patient Identification: Jeanette Rose MRN:  [id number] Date of Evaluation:  03/15/2019 Chief Complaint:  Major depressive Disorder Principal Diagnosis: Severe recurrent major depression without psychotic features ([place]) Diagnosis:  Principal Problem:   Severe recurrent major depression without psychotic features ([place]) Active Problems:   Type 2 diabetes mellitus with diabetic polyneuropathy, with long-term current use of insulin (HCC)   GERD (gastroesophageal reflux disease)   Hypertension   Obesity (BMI 30-39.9)   Constipation   History of hypothyroidism   Major depression  History of Present Illness: Patient seen and chart reviewed.  Patient transferred from behavioral health [facility] for consideration of ECT.  Patient has a history of major depression and has been seeing an outpatient psychiatrist for many years.  Came into the hospital this time because she was having active suicidal thoughts although she had not actually done anything to hurt her self.  Mood has been increasingly down and sad and hopeless all the time.  Anxious constantly.  Rarely feels like she is able to leave the house because of her anxiety.  Patient has been on escalating doses of benzodiazepines outside the hospital which in her opinion seem to have become paradoxically more agitating.  She denies any hallucinations or psychotic symptoms.  Denies any substance abuse.  Since admission to behavioral health Hospital has remained very anxious withdrawn depressed and hopeless.  Referred for ECT because of failure to respond to multiple medicines.  Patient also has several medical conditions most prominently diabetes and high blood pressure. Associated Signs/Symptoms: Depression Symptoms:  depressed mood, anhedonia, insomnia, psychomotor retardation, feelings of worthlessness/guilt, difficulty concentrating, hopelessness, impaired memory, suicidal thoughts without  plan, anxiety, (Hypo) Manic Symptoms:  None reported Anxiety Symptoms:  Excessive Worry, Social Anxiety, Psychotic Symptoms:  None reported PTSD Symptoms: Had a traumatic exposure:  Patient suffered a significant trauma several years ago when her adult son died in her house and the patient herself found the body.  This trauma is still very fresh for her although it is also clear that her depression predates it. Total Time spent with patient: 1 hour  Past Psychiatric History: Past history of multiple medication trials.  Patient remembers having tried Prozac Lexapro Wellbutrin and Remeron and the Cymbalta that she was admitted on.  Patient was on 120 mg of Cymbalta at admission as well as up to 7 mg a day of Ativan.  Some changes and augmentations were attempted at behavioral health Hospital without much improvement.  No previous ECT  Is the patient at risk to self? Yes.    Has the patient been a risk to self in the past 6 months? Yes.    Has the patient been a risk to self within the distant past? Yes.    Is the patient a risk to others? No.  Has the patient been a risk to others in the past 6 months? No.  Has the patient been a risk to others within the distant past? No.   Prior Inpatient Therapy:   Prior Outpatient Therapy:    Alcohol Screening: 1. How often do you have a drink containing alcohol?: Never 2. How many drinks containing alcohol do you have on a typical day when you are drinking?: 1 or 2 3. How often do you have six or more drinks on one occasion?: Never AUDIT-C Score: 0 4. How often during the last year have you found that you were not able to stop drinking once you had started?: Never 5. How often  during the last year have you failed to do what was normally expected from you becasue of drinking?: Never 6. How often during the last year have you needed a first drink in the morning to get yourself going after a heavy drinking session?: Never 7. How often during the last  year have you had a feeling of guilt of remorse after drinking?: Never 8. How often during the last year have you been unable to remember what happened the night before because you had been drinking?: Never 9. Have you or someone else been injured as a result of your drinking?: No 10. Has a relative or friend or a doctor or another health worker been concerned about your drinking or suggested you cut down?: No Alcohol Use Disorder Identification Test Final Score (AUDIT): 0 Alcohol Brief Interventions/Follow-up: AUDIT Score <7 follow-up not indicated, Alcohol Education Substance Abuse History in the last 12 months:  No. Consequences of Substance Abuse: Negative Previous Psychotropic Medications: Yes  Psychological Evaluations: Yes  Past Medical History:  Past Medical History:  Diagnosis Date  . Acute acalculous cholecystitis s/p lap chole 04/03/2014 04/02/2014  . Allergy   . Anemia   . Anxiety   . Asthma   . Depression   . Fatty liver    noted on CT per [facility] records  . Fibromyalgia   . GERD (gastroesophageal reflux disease)   . Hyperlipidemia associated with type 2 diabetes mellitus ([place])   . Hypertension   . Hypothyroid 2015   Transiently in 2015. Synthroid 29mg was stopped 05/07/2016 w/ tsh nml following.  . Obesity (BMI 30-39.9)   . OSA (obstructive sleep apnea)    non-compliant with CPAP  . Seborrheic dermatitis    [facility]  . Stroke ([facility]    TIA  . Type 2 diabetes mellitus ([place]     Past Surgical History:  Procedure Laterality Date  . BREAST REDUCTION SURGERY  06/2008  . CHOLECYSTECTOMY N/A 04/03/2014   Procedure: LAPAROSCOPIC CHOLECYSTECTOMY ;  Surgeon: SAdin Hector MD;  Location: [facility];  Service: General;  Laterality: N/A;  . REDUCTION MAMMAPLASTY    . TUBAL LIGATION     Family History:  Family History  Problem Relation Age of Onset  . Atrial fibrillation Mother   . Heart disease Father        CAD  . Diabetes Brother    . Cancer Maternal Grandmother   . Diabetes Paternal Grandmother   . Fibromyalgia Sister   . Colitis Sister    Family Psychiatric  History: Positive for what sounds like depression in her son. Tobacco Screening: Have you used any form of tobacco in the last 30 days? (Cigarettes, Smokeless Tobacco, Cigars, and/or Pipes): No Social History:  Social History   Substance and Sexual Activity  Alcohol Use No     Social History   Substance and Sexual Activity  Drug Use No    Additional Social History:                           Allergies:   Allergies  Allergen Reactions  . Farxiga [Dapagliflozin] Anxiety and Other (See Comments)    Made depression and anxiety worse  . Rexulti [Brexpiprazole] Anxiety and Other (See Comments)    Made anxiety worse  . Trintellix [Vortioxetine] Anxiety and Other (See Comments)    Made anxiety worse   Lab Results:  Results for orders placed or performed during the  hospital encounter of 03/15/19 (from the past 48 hour(s))  Glucose, capillary     Status: Abnormal   Collection Time: 03/15/19  5:39 PM  Result Value Ref Range   Glucose-Capillary 253 (H) 70 - 99 mg/dL    Blood Alcohol level:  Lab Results  Component Value Date   ETH <10 28/63/8177    Metabolic Disorder Labs:  Lab Results  Component Value Date   HGBA1C 6.3 (H) 03/09/2019   MPG 134.11 03/09/2019   MPG 180 (H) 04/03/2014   No results found for: PROLACTIN Lab Results  Component Value Date   CHOL 174 10/09/2018   TRIG 210 (H) 10/09/2018   HDL 44 10/09/2018   CHOLHDL 4.0 10/09/2018   LDLCALC 88 10/09/2018   LDLCALC 77 06/28/2018    Current Medications: Current Facility-Administered Medications  Medication Dose Route Frequency Provider Last Rate Last Dose  . acetaminophen (TYLENOL) tablet 650 mg  650 mg Oral Q6H PRN Atthew Coutant T, MD      . alum & mag hydroxide-simeth (MAALOX/MYLANTA) 200-200-20 MG/5ML suspension 30 mL  30 mL Oral Q4H PRN Jaryd Drew, Madie Reno, MD       . Derrill Memo ON 03/16/2019] aspirin EC tablet 81 mg  81 mg Oral Daily Damarri Rampy T, MD      . atorvastatin (LIPITOR) tablet 80 mg  80 mg Oral q1800 Nicle Connole T, MD      . docusate sodium (COLACE) capsule 100 mg  100 mg Oral BID Dearl Rudden, Madie Reno, MD      . Derrill Memo ON 03/16/2019] DULoxetine (CYMBALTA) DR capsule 60 mg  60 mg Oral Daily Trevonn Hallum T, MD      . feeding supplement (ENSURE ENLIVE) (ENSURE ENLIVE) liquid 237 mL  237 mL Oral BID BM Beuford Garcilazo T, MD      . hydrOXYzine (ATARAX/VISTARIL) tablet 25 mg  25 mg Oral TID PRN Ashton Belote T, MD      . insulin aspart (novoLOG) injection 0-15 Units  0-15 Units Subcutaneous TID WC Stella Bortle T, MD      . Derrill Memo ON 03/16/2019] irbesartan (AVAPRO) tablet 150 mg  150 mg Oral Daily Lenea Bywater T, MD      . LORazepam (ATIVAN) tablet 1 mg  1 mg Oral Q8H PRN Jeremian Whitby T, MD      . LORazepam (ATIVAN) tablet 1.5 mg  1.5 mg Oral QHS Shoji Pertuit T, MD      . Derrill Memo ON 03/16/2019] LORazepam (ATIVAN) tablet 1.5 mg  1.5 mg Oral BH-q7a Ranier Coach T, MD      . magnesium hydroxide (MILK OF MAGNESIA) suspension 30 mL  30 mL Oral Daily PRN Alleyah Twombly T, MD      . metFORMIN (GLUCOPHAGE-XR) 24 hr tablet 1,000 mg  1,000 mg Oral BID WC Meshia Rau, Madie Reno, MD      . Derrill Memo ON 03/16/2019] multivitamin with minerals tablet 1 tablet  1 tablet Oral Daily Scot Shiraishi T, MD      . nitrofurantoin (macrocrystal-monohydrate) (MACROBID) capsule 100 mg  100 mg Oral Q12H Dennisha Mouser T, MD      . Derrill Memo ON 03/16/2019] pantoprazole (PROTONIX) EC tablet 40 mg  40 mg Oral BH-q7a Edgerrin Correia T, MD      . polyethylene glycol (MIRALAX / GLYCOLAX) packet 17 g  17 g Oral Daily PRN Raylen Ken T, MD      . senna-docusate (Senokot-S) tablet 2 tablet  2 tablet Oral QHS Ianmichael Amescua, Madie Reno, MD      .  sertraline (ZOLOFT) tablet 25 mg  25 mg Oral Daily Candelario Steppe T, MD      . Derrill Memo ON 03/16/2019] thiamine (VITAMIN B-1) tablet 100 mg  100 mg Oral Daily Cardin Nitschke T, MD        PTA Medications: Medications Prior to Admission  Medication Sig Dispense Refill Last Dose  . aspirin (ASPIRIN LOW DOSE) 81 MG tablet Take 81 mg by mouth daily.      Marland Kitchen atorvastatin (LIPITOR) 80 MG tablet TAKE 1 TABLET (80 MG TOTAL) BY MOUTH DAILY AT 6 PM. 90 tablet 0   . [START ON 03/16/2019] DULoxetine (CYMBALTA) 60 MG capsule Take 1 capsule (60 mg total) by mouth daily. 1 capsule 0   . feeding supplement, ENSURE ENLIVE, (ENSURE ENLIVE) LIQD Take 237 mLs by mouth 2 (two) times daily between meals. 1 mL 0   . hydrOXYzine (ATARAX/VISTARIL) 25 MG tablet Take 1 tablet (25 mg total) by mouth 3 (three) times daily as needed for anxiety. 1 tablet 0   . [START ON 03/16/2019] irbesartan (AVAPRO) 150 MG tablet Take 1 tablet (150 mg total) by mouth daily. 1 tablet 0   . LORazepam (ATIVAN) 0.5 MG tablet Take 3 tablets (1.5 mg total) by mouth at bedtime. 1 tablet 0   . LORazepam (ATIVAN) 1 MG tablet Take 1 tablet (1 mg total) by mouth every 8 (eight) hours as needed (withdrawal symptoms). 1 tablet 0   . metFORMIN (FORTAMET) 1000 MG (OSM) 24 hr tablet Take 1 tablet (1,000 mg total) by mouth 2 (two) times daily with a meal. 1 tablet 0   . [START ON 03/16/2019] Multiple Vitamin (MULTIVITAMIN WITH MINERALS) TABS tablet Take 1 tablet by mouth daily. 1 tablet 0   . nitrofurantoin, macrocrystal-monohydrate, (MACROBID) 100 MG capsule Take 1 capsule (100 mg total) by mouth every 12 (twelve) hours. 5 capsule 0   . nortriptyline (PAMELOR) 25 MG capsule Take 1 capsule (25 mg total) by mouth at bedtime. 1 capsule 0   . [START ON 03/16/2019] nortriptyline (PAMELOR) 25 MG capsule Take 1 capsule (25 mg total) by mouth daily. 1 capsule 0   . Oxcarbazepine (TRILEPTAL) 300 MG tablet Take 1 tablet (300 mg total) by mouth 2 (two) times daily. 1 tablet 0   . [START ON 03/16/2019] pantoprazole (PROTONIX) 40 MG tablet Take 1 tablet (40 mg total) by mouth every morning. 1 tablet 0   . polyethylene glycol (MIRALAX / GLYCOLAX) 17 g packet  Take 17 g by mouth daily as needed for mild constipation or moderate constipation. 1 each 0   . [START ON 03/16/2019] thiamine 100 MG tablet Take 1 tablet (100 mg total) by mouth daily. 1 tablet 0     Musculoskeletal: Strength & Muscle Tone: within normal limits Gait & Station: normal Patient leans: N/A  Psychiatric Specialty Exam: Physical Exam  Nursing note and vitals reviewed. Constitutional: She appears well-developed and well-nourished.  HENT:  Head: Normocephalic and atraumatic.  Eyes: Pupils are equal, round, and reactive to light. Conjunctivae are normal.  Neck: Normal range of motion.  Cardiovascular: Regular rhythm and normal heart sounds.  Respiratory: Effort normal. No respiratory distress.  GI: Soft.  Musculoskeletal: Normal range of motion.  Neurological: She is alert.  Skin: Skin is warm and dry.  Psychiatric: Her mood appears anxious. Her speech is delayed. She is slowed and withdrawn. Cognition and memory are impaired. She expresses impulsivity. She exhibits a depressed mood. She expresses suicidal ideation. She expresses no homicidal ideation. She expresses no  suicidal plans.    Review of Systems  Constitutional: Negative.   HENT: Negative.   Eyes: Negative.   Respiratory: Negative.   Cardiovascular: Negative.   Gastrointestinal: Negative.   Musculoskeletal: Negative.   Skin: Negative.   Neurological: Negative.   Psychiatric/Behavioral: Positive for depression and suicidal ideas. The patient is nervous/anxious and has insomnia.     Blood pressure (!) 179/94, pulse (!) 124, temperature 98 F (36.7 C), temperature source Oral, resp. rate 18, height 5' 4"  (1.626 m), weight 76.7 kg, SpO2 99 %.Body mass index is 29.01 kg/m.  General Appearance: Disheveled  Eye Contact:  Fair  Speech:  Clear and Coherent  Volume:  Decreased  Mood:  Anxious and Depressed  Affect:  Congruent  Thought Process:  Coherent  Orientation:  Full (Time, Place, and Person)  Thought  Content:  Rumination and Tangential  Suicidal Thoughts:  Yes.  without intent/plan  Homicidal Thoughts:  No  Memory:  Immediate;   Fair Recent;   Fair Remote;   Fair  Judgement:  Fair  Insight:  Fair  Psychomotor Activity:  Restlessness  Concentration:  Concentration: Fair  Recall:  [employer] of Knowledge:  Fair  Language:  Fair  Akathisia:  No  Handed:  Right  AIMS (if indicated):     Assets:  Desire for Improvement Housing Resilience  ADL's:  Intact  Cognition:  Impaired,  Mild  Sleep:       Treatment Plan Summary: Daily contact with patient to assess and evaluate symptoms and progress in treatment, Medication management and Plan Medications will be adjusted as needed.  I reviewed her medication history and pointed out that the Cymbalta clearly had not been helping.  I think that it would be worthwhile for [place] to try to gradually cross taper it with a different medicine such as Zoloft.  Meanwhile patient is agreeable to ECT treatment.  Lab work-up is pretty much complete.  We will get a chest x-ray tonight but otherwise nothing should be needed.  Patient was given opportunity to ask questions and was given a full explanation of ECT and gives consent.  Working to make an effort to try and get her blood sugar under better control.  Continue involvement in individual and group therapy as well.  Observation Level/Precautions:  15 minute checks  Laboratory:  HbAIC  Psychotherapy:    Medications:    Consultations:    Discharge Concerns:    Estimated LOS:  Other:     Physician Treatment Plan for Primary Diagnosis: Severe recurrent major depression without psychotic features ([place]) Long Term Goal(s): Improvement in symptoms so as ready for discharge  Short Term Goals: Ability to verbalize feelings will improve, Ability to disclose and discuss suicidal ideas and Ability to demonstrate self-control will improve  Physician Treatment Plan for Secondary Diagnosis: Principal Problem:    Severe recurrent major depression without psychotic features ([place]) Active Problems:   Type 2 diabetes mellitus with diabetic polyneuropathy, with long-term current use of insulin (HCC)   GERD (gastroesophageal reflux disease)   Hypertension   Obesity (BMI 30-39.9)   Constipation   History of hypothyroidism   Major depression  Long Term Goal(s): Improvement in symptoms so as ready for discharge  Short Term Goals: Ability to maintain clinical measurements within normal limits will improve and Compliance with prescribed medications will improve  I certify that inpatient services furnished can reasonably be expected to improve the patient's condition.    Alethia Berthold, MD 6/18/20206:00 PM

## 2019-03-16 ENCOUNTER — Inpatient Hospital Stay: Payer: BLUE CROSS/BLUE SHIELD | Admitting: Anesthesiology

## 2019-03-16 LAB — GLUCOSE, CAPILLARY
Glucose-Capillary: 159 mg/dL — ABNORMAL HIGH (ref 70–99)
Glucose-Capillary: 180 mg/dL — ABNORMAL HIGH (ref 70–99)
Glucose-Capillary: 161 mg/dL — ABNORMAL HIGH (ref 70–99)
Glucose-Capillary: 208 mg/dL — ABNORMAL HIGH (ref 70–99)

## 2019-03-16 MED ORDER — METHOHEXITAL SODIUM 100 MG/10ML IV SOSY
PREFILLED_SYRINGE | INTRAVENOUS | Status: DC | PRN
  Administered 2019-03-16: 80 mg via INTRAVENOUS

## 2019-03-16 MED ORDER — MIDAZOLAM HCL 2 MG/2ML IJ SOLN
2.0000 mg | Freq: Once | INTRAMUSCULAR | Status: AC
  Administered 2019-03-16: 2 mg via INTRAVENOUS

## 2019-03-16 MED ORDER — SODIUM CHLORIDE 0.9 % IV SOLN
INTRAVENOUS | Status: DC | PRN
  Administered 2019-03-16: 10:00:00 via INTRAVENOUS

## 2019-03-16 MED ORDER — ONDANSETRON HCL 4 MG/2ML IJ SOLN: 4.0000 mg | Freq: Once | INTRAMUSCULAR | Status: DC

## 2019-03-16 MED ORDER — GLYCOPYRROLATE 0.2 MG/ML IJ SOLN
INTRAMUSCULAR | Status: AC
  Filled 2019-03-16: qty 1

## 2019-03-16 MED ORDER — GLYCOPYRROLATE 0.2 MG/ML IJ SOLN
0.1000 mg | Freq: Once | INTRAMUSCULAR | Status: AC
  Administered 2019-03-16: 10:00:00 0.1 mg via INTRAVENOUS

## 2019-03-16 MED ORDER — MENTHOL 3 MG MT LOZG: 1.0000 | LOZENGE | OROMUCOSAL | Status: DC | PRN

## 2019-03-16 MED ORDER — SUCCINYLCHOLINE CHLORIDE 20 MG/ML IJ SOLN
INTRAMUSCULAR | Status: AC
  Filled 2019-03-16: qty 1

## 2019-03-16 MED ORDER — DOCUSATE SODIUM 100 MG PO CAPS
200.0000 mg | ORAL_CAPSULE | Freq: Two times a day (BID) | ORAL | Status: DC
  Administered 2019-03-16 – 2019-03-17 (×2): 200 mg via ORAL
  Filled 2019-03-16 (×2): qty 2

## 2019-03-16 MED ORDER — KETOROLAC TROMETHAMINE 30 MG/ML IJ SOLN
INTRAMUSCULAR | Status: AC
  Administered 2019-03-16: 30 mg via INTRAVENOUS
  Filled 2019-03-16: qty 1

## 2019-03-16 MED ORDER — MENTHOL 3 MG MT LOZG
1.0000 | LOZENGE | OROMUCOSAL | Status: DC | PRN
  Filled 2019-03-16: qty 9

## 2019-03-16 MED ORDER — MIDAZOLAM HCL 2 MG/2ML IJ SOLN
INTRAMUSCULAR | Status: AC
  Administered 2019-03-16: 2 mg via INTRAVENOUS
  Filled 2019-03-16: qty 2

## 2019-03-16 MED ORDER — SODIUM CHLORIDE 0.9 % IV SOLN
500.0000 mL | Freq: Once | INTRAVENOUS | Status: AC
  Administered 2019-03-16: 500 mL via INTRAVENOUS

## 2019-03-16 MED ORDER — KETOROLAC TROMETHAMINE 30 MG/ML IJ SOLN
30.0000 mg | Freq: Once | INTRAMUSCULAR | Status: AC
  Administered 2019-03-16: 10:00:00 30 mg via INTRAVENOUS

## 2019-03-16 MED ORDER — SUCCINYLCHOLINE CHLORIDE 200 MG/10ML IV SOSY
PREFILLED_SYRINGE | INTRAVENOUS | Status: DC | PRN
  Administered 2019-03-16: 100 mg via INTRAVENOUS

## 2019-03-16 MED ORDER — GLYCOPYRROLATE 0.2 MG/ML IJ SOLN
INTRAMUSCULAR | Status: AC
  Administered 2019-03-16: 0.1 mg via INTRAVENOUS
  Filled 2019-03-16: qty 1

## 2019-03-16 NOTE — Anesthesia Postprocedure Evaluation (Signed)
Anesthesia Post Note  Patient: Jeanette Rose  Procedure(s) Performed: ECT TX  Patient location during evaluation: PACU Anesthesia Type: General Level of consciousness: awake and alert Pain management: pain level controlled Vital Signs Assessment: post-procedure vital signs reviewed and stable Respiratory status: spontaneous breathing, nonlabored ventilation, respiratory function stable and patient connected to nasal cannula oxygen Cardiovascular status: blood pressure returned to baseline and stable Postop Assessment: no apparent nausea or vomiting Anesthetic complications: no     Last Vitals:  Vitals:   03/16/19 1115 03/16/19 1125  BP: (!) 143/80 (!) 148/88  Pulse: (!) 123 (!) 120  Resp: (!) 23 18  Temp:  36.8 C  SpO2: 95% 95%    Last Pain:  Vitals:   03/16/19 1148  TempSrc:   PainSc: 0-No pain                 Precious Haws Jahnaya Branscome

## 2019-03-16 NOTE — [facility] Group Notes (Signed)

## 2019-03-16 NOTE — Anesthesia Preprocedure Evaluation (Signed)
Anesthesia Evaluation  Patient identified by MRN, date of birth, ID band Patient awake    Reviewed: Allergy & Precautions, H&P , NPO status , Patient's Chart, lab work & pertinent test results  Airway Mallampati: III  TM Distance: >3 FB Neck ROM: full    Dental  (+) Chipped, Poor Dentition, Missing   Pulmonary neg shortness of breath, asthma , sleep apnea , former smoker,           Cardiovascular Exercise Tolerance: Good hypertension, (-) angina(-) Past MI      Neuro/Psych PSYCHIATRIC DISORDERS  Neuromuscular disease CVA, Residual Symptoms negative psych ROS   GI/Hepatic GERD  Medicated and Controlled,(+) Hepatitis -  Endo/Other  diabetes, Type 2, Insulin DependentHypothyroidism   Renal/GU negative Renal ROS  negative genitourinary   Musculoskeletal   Abdominal   Peds  Hematology negative hematology ROS (+)   Anesthesia Other Findings Past Medical History: 04/02/2014: Acute acalculous cholecystitis s/p lap chole 04/03/2014 No date: Allergy No date: Anemia No date: Anxiety No date: Asthma No date: Depression No date: Fatty liver     Comment:  noted on CT [facility] No date: Fibromyalgia No date: GERD (gastroesophageal reflux disease) No date: Hyperlipidemia associated with type 2 diabetes mellitus ([place]) No date: Hypertension 2015: Hypothyroid     Comment:  Transiently in 2015. Synthroid 37mg was stopped               05/07/2016 w/ tsh nml following. No date: Obesity (BMI 30-39.9) No date: OSA (obstructive sleep apnea)     Comment:  non-compliant with CPAP No date: Seborrheic dermatitis     Comment[facility]  [facility] No [facility]: [facility]     Comment:  TIA No date: [place] 2 [place]  Past Surgical History: 06/2008: BREAST REDUCTION SURGERY 04/03/2014: CHOLECYSTECTOMY; N/A     Comment:  Procedure: LAPAROSCOPIC CHOLECYSTECTOMY ;  Surgeon:               SAdin Hector MD;  Location: [facility];  Service:               General;  Laterality: N/A; No date: REDUCTION MAMMAPLASTY No date: TUBAL LIGATION  BMI    Body Mass Index: 29.01 kg/m      Reproductive/Obstetrics negative OB ROS                             Anesthesia Physical Anesthesia Plan  ASA: III  Anesthesia Plan: General   Post-op Pain Management:    Induction: Intravenous  PONV Risk Score and Plan: Propofol infusion and TIVA  Airway Management Planned: Mask  Additional Equipment:   Intra-op Plan:   Post-operative Plan:   Informed Consent: I have reviewed the patients History and Physical, chart, labs and discussed the procedure including the risks, benefits and alternatives for the proposed anesthesia with the patient or authorized representative who has indicated his/her understanding and acceptance.     Dental Advisory Given  Plan Discussed with: Anesthesiologist, CRNA and Surgeon  Anesthesia Plan Comments: (Patient consented for risks of anesthesia including but not limited to:  - adverse reactions to medications - risk of intubation if required - damage to teeth, lips or other oral mucosa - sore throat or hoarseness - Damage to heart, brain, lungs or loss of life  Patient voiced understanding.)        Anesthesia Quick Evaluation

## 2019-03-16 NOTE — Progress Notes (Signed)
[facility] MD Progress Note  03/16/2019 2:02 PM Jeanette Rose  MRN:  [id number] Subjective: Patient seen and chart reviewed.  Patient had ECT this morning.  No complications.  Continues to state that her mood feels anxious and depressed.  She told me that she began having more suicidal thoughts after taking Zoloft last night.  The proximity to the immediate dose makes it extraordinarily unlikely that these things are actually connected other than by placebo effect.  In any case she is also having some physical complaints such as difficulty urinating and constipation both of which may be from the nortriptyline that has now been discontinued.  Patient's affect remains sad and withdrawn.  Appears to be lucid.  No other change to any medicine today.  Next ECT scheduled for Monday. Principal Problem: Severe recurrent major depression without psychotic features ([place]) Diagnosis: Principal Problem:   Severe recurrent major depression without psychotic features ([place]) Active Problems:   Type 2 diabetes mellitus with diabetic polyneuropathy, with long-term current use of insulin (HCC)   GERD (gastroesophageal reflux disease)   Hypertension   Obesity (BMI 30-39.9)   Constipation   History of hypothyroidism   Major depression  Total Time spent with patient: 30 minutes  Past Psychiatric History: Patient has a history of recurrent depression and anxiety  Past Medical History:  Past Medical History:  Diagnosis Date  . Acute acalculous cholecystitis s/p lap chole 04/03/2014 04/02/2014  . Allergy   . Anemia   . Anxiety   . Asthma   . Depression   . Fatty liver    noted on CT per [facility] records  . Fibromyalgia   . GERD (gastroesophageal reflux disease)   . Hyperlipidemia associated with type 2 diabetes mellitus ([place])   . Hypertension   . Hypothyroid 2015   Transiently in 2015. Synthroid 22mg was stopped 05/07/2016 w/ tsh nml following.  . Obesity (BMI 30-39.9)   . OSA (obstructive sleep apnea)    non-compliant with CPAP  . Seborrheic dermatitis    [facility]  . Stroke ([facility]    TIA  . Type 2 diabetes mellitus ([place]     Past Surgical History:  Procedure Laterality Date  . BREAST REDUCTION SURGERY  06/2008  . CHOLECYSTECTOMY N/A 04/03/2014   Procedure: LAPAROSCOPIC CHOLECYSTECTOMY ;  Surgeon: SAdin Hector MD;  Location: [facility] ORS;  Service: General;  Laterality: N/A;  . REDUCTION MAMMAPLASTY    . TUBAL LIGATION     Family History:  Family History  Problem Relation Age of Onset  . Atrial fibrillation Mother   . Heart disease Father        CAD  . Diabetes Brother   . Cancer Maternal Grandmother   . Diabetes Paternal Grandmother   . Fibromyalgia Sister   . Colitis Sister    Family Psychiatric  History: See previous Social History:  Social History   Substance and Sexual Activity  Alcohol Use No     Social History   Substance and Sexual Activity  Drug Use No    Social History   Socioeconomic History  . Marital status: Divorced    Spouse name: Not on file  . Number of children: Not on file  . Years of education: Not on file  . Highest education level: Not on file  Occupational History  . Not on file  Social Needs  . Financial resource strain: Not on file  . Food insecurity    Worry: Not on file  Inability: Not on file  . Transportation needs    Medical: Not on file    Non-medical: Not on file  Tobacco Use  . Smoking status: Former Smoker    Packs/day: 4.00    Years: 15.00    Pack years: 60.00    Types: Cigarettes    Quit date: 04/22/2004    Years since quitting: 14.9  . Smokeless tobacco: Never Used  . Tobacco comment: 4 pack year hx  Substance and Sexual Activity  . Alcohol use: No  . Drug use: No  . Sexual activity: Never  Lifestyle  . Physical activity    Days per week: Not on file    Minutes per session: Not on file  . Stress: Not on file  Relationships  . Social Herbalist on phone: Not  on file    Gets together: Not on file    Attends religious service: Not on file    Active member of club or organization: Not on file    Attends meetings of clubs or organizations: Not on file    Relationship status: Not on file  Other Topics Concern  . Not on file  Social History Narrative  . Not on file   Additional Social History:                         Sleep: Fair  Appetite:  Fair  Current Medications: Current Facility-Administered Medications  Medication Dose Route Frequency Provider Last Rate Last Dose  . acetaminophen (TYLENOL) tablet 650 mg  650 mg Oral Q6H PRN Clapacs, John T, MD      . alum & mag hydroxide-simeth (MAALOX/MYLANTA) 200-200-20 MG/5ML suspension 30 mL  30 mL Oral Q4H PRN Clapacs, John T, MD      . aspirin EC tablet 81 mg  81 mg Oral Daily Clapacs, John T, MD   81 mg at 03/16/19 1221  . atorvastatin (LIPITOR) tablet 80 mg  80 mg Oral q1800 Clapacs, Madie Reno, MD   80 mg at 03/15/19 1803  . docusate sodium (COLACE) capsule 200 mg  200 mg Oral BID Clapacs, John T, MD      . DULoxetine (CYMBALTA) DR capsule 60 mg  60 mg Oral Daily Clapacs, Madie Reno, MD   60 mg at 03/16/19 1215  . feeding supplement (ENSURE ENLIVE) (ENSURE ENLIVE) liquid 237 mL  237 mL Oral BID BM Clapacs, John T, MD      . insulin aspart (novoLOG) injection 0-15 Units  0-15 Units Subcutaneous TID WC Clapacs, Madie Reno, MD   Stopped at 03/16/19 0800  . irbesartan (AVAPRO) tablet 150 mg  150 mg Oral Daily Clapacs, Madie Reno, MD   150 mg at 03/16/19 7989  . LORazepam (ATIVAN) tablet 1 mg  1 mg Oral Q8H PRN Clapacs, John T, MD      . LORazepam (ATIVAN) tablet 1.5 mg  1.5 mg Oral QHS Clapacs, John T, MD   1.5 mg at 03/15/19 2127  . LORazepam (ATIVAN) tablet 1.5 mg  1.5 mg Oral BH-q7a Clapacs, Madie Reno, MD   1.5 mg at 03/16/19 0901  . magnesium hydroxide (MILK OF MAGNESIA) suspension 30 mL  30 mL Oral Daily PRN Clapacs, John T, MD      . menthol-cetylpyridinium (CEPACOL) lozenge 3 mg  1 lozenge Oral PRN  Clapacs, John T, MD      . metFORMIN (GLUCOPHAGE-XR) 24 hr tablet 1,000 mg  1,000 mg Oral BID WC  Clapacs, Madie Reno, MD   1,000 mg at 03/16/19 1215  . multivitamin with minerals tablet 1 tablet  1 tablet Oral Daily Clapacs, Madie Reno, MD   1 tablet at 03/16/19 1215  . ondansetron (ZOFRAN) injection 4 mg  4 mg Intravenous Once Clapacs, John T, MD      . pantoprazole (PROTONIX) EC tablet 40 mg  40 mg Oral BH-q7a Clapacs, Madie Reno, MD   40 mg at 03/16/19 1215  . polyethylene glycol (MIRALAX / GLYCOLAX) packet 17 g  17 g Oral Daily PRN Clapacs, John T, MD      . senna-docusate (Senokot-S) tablet 2 tablet  2 tablet Oral QHS Clapacs, Madie Reno, MD   2 tablet at 03/15/19 2128  . sertraline (ZOLOFT) tablet 25 mg  25 mg Oral Daily Clapacs, Madie Reno, MD   25 mg at 03/15/19 1804  . thiamine (VITAMIN B-1) tablet 100 mg  100 mg Oral Daily Clapacs, Madie Reno, MD   100 mg at 03/16/19 1238    Lab Results:  Results for orders placed or performed during the hospital encounter of 03/15/19 (from the past 48 hour(s))  Glucose, capillary     Status: Abnormal   Collection Time: 03/15/19  5:39 PM  Result Value Ref Range   Glucose-Capillary 253 (H) 70 - 99 mg/dL  Glucose, capillary     Status: Abnormal   Collection Time: 03/16/19  6:39 AM  Result Value Ref Range   Glucose-Capillary 159 (H) 70 - 99 mg/dL  Glucose, capillary     Status: Abnormal   Collection Time: 03/16/19  9:33 AM  Result Value Ref Range   Glucose-Capillary 180 (H) 70 - 99 mg/dL  Glucose, capillary     Status: Abnormal   Collection Time: 03/16/19 10:49 AM  Result Value Ref Range   Glucose-Capillary 161 (H) 70 - 99 mg/dL    Blood Alcohol level:  Lab Results  Component Value Date   ETH <10 69/62/9528    Metabolic Disorder Labs: Lab Results  Component Value Date   HGBA1C 6.3 (H) 03/09/2019   MPG 134.11 03/09/2019   MPG 180 (H) 04/03/2014   No results found for: PROLACTIN Lab Results  Component Value Date   CHOL 174 10/09/2018   TRIG 210 (H)  10/09/2018   HDL 44 10/09/2018   CHOLHDL 4.0 10/09/2018   LDLCALC 88 10/09/2018   LDLCALC 77 06/28/2018    Physical Findings: AIMS:  , ,  ,  ,    CIWA:    COWS:     Musculoskeletal: Strength & Muscle Tone: within normal limits Gait & Station: normal Patient leans: N/A  Psychiatric Specialty Exam: Physical Exam  Nursing note and vitals reviewed. Constitutional: She appears well-developed and well-nourished.  HENT:  Head: Normocephalic and atraumatic.  Eyes: Pupils are equal, round, and reactive to light. Conjunctivae are normal.  Neck: Normal range of motion.  Cardiovascular: Regular rhythm and normal heart sounds.  Respiratory: Effort normal. No respiratory distress.  GI: Soft.  Musculoskeletal: Normal range of motion.  Neurological: She is alert.  Skin: Skin is warm and dry.  Psychiatric: Her mood appears anxious. Her speech is delayed. She is slowed and withdrawn. Cognition and memory are impaired. She expresses impulsivity. She exhibits a depressed mood. She expresses suicidal ideation. She expresses no suicidal plans.    Review of Systems  Constitutional: Negative.   HENT: Negative.   Eyes: Negative.   Respiratory: Negative.   Cardiovascular: Negative.   Gastrointestinal: Negative.   Musculoskeletal:  Negative.   Skin: Negative.   Neurological: Negative.   Psychiatric/Behavioral: Positive for depression, memory loss and suicidal ideas. Negative for hallucinations and substance abuse. The patient is nervous/anxious and has insomnia.     Blood pressure (!) 148/88, pulse (!) 120, temperature 98.2 F (36.8 C), resp. rate 18, height 5' 4"  (1.626 m), weight 76.7 kg, SpO2 95 %.Body mass index is 29.01 kg/m.  General Appearance: Casual  Eye Contact:  Fair  Speech:  Slow  Volume:  Decreased  Mood:  Anxious  Affect:  Congruent  Thought Process:  Coherent  Orientation:  Full (Time, Place, and Person)  Thought Content:  Tangential  Suicidal Thoughts:  Yes.  without  intent/plan  Homicidal Thoughts:  No  Memory:  Immediate;   Fair Recent;   Fair Remote;   Fair  Judgement:  Fair  Insight:  Shallow  Psychomotor Activity:  Restlessness  Concentration:  Concentration: Fair  Recall:  [employer] of Knowledge:  Fair  Language:  Fair  Akathisia:  No  Handed:  Right  AIMS (if indicated):     Assets:  Desire for Improvement Housing Resilience  ADL's:  Intact  Cognition:  WNL  Sleep:  Number of Hours: 4.5     Treatment Plan Summary: Daily contact with patient to assess and evaluate symptoms and progress in treatment, Medication management and Plan Still very depressed and anxious.  Hopefully the ECT will be addressing this.  Gave her some psychoeducation and supportive counseling.  Encouraged her to be out of bed and interacting.  Blood sugars appear to be stabilizing and we will discontinue the sliding scale for now.  Next ECT scheduled for Monday.  I made sure that she has medicine for constipation already provided.  Alethia Berthold, MD 03/16/2019, 2:02 PM

## 2019-03-16 NOTE — Plan of Care (Signed)
  Problem: Coping: Goal: Will verbalize feelings Outcome: Progressing  Patient verbalized feeling concerning her still grieving about her son that died from overdose.  Problem: Health Behavior/Discharge Planning: Goal: Compliance with therapeutic regimen will improve Outcome: Progressing  Patient compliant with prescribed medication regimen.

## 2019-03-16 NOTE — Progress Notes (Signed)
Recreation Therapy Notes   Date: 03/16/2019  Time: 9:30 am   Location: Craft room   Behavioral response: N/A   Intervention Topic: Relaxation  Discussion/Intervention: Patient did not attend group.   Clinical Observations/Feedback:  Patient did not attend group.   Emanuele Mcwhirter LRT/CTRS        Lashun Ramseyer 03/16/2019 11:25 AM

## 2019-03-16 NOTE — Progress Notes (Signed)
Recreation Therapy Notes  INPATIENT RECREATION THERAPY ASSESSMENT  Patient Details Name: Jeanette Rose MRN: [id number] DOB: 08-13-1956 Today's Date: 03/16/2019       Information Obtained From: Patient  Able to Participate in Assessment/Interview: Yes  Patient Presentation: Responsive  Reason for Admission (Per Patient): Active Symptoms, Other (Comments)(depression, anxiety)  Patient Stressors:    Coping Skills:   (None)  Leisure Interests (2+):  Individual - TV  Frequency of Recreation/Participation:    Awareness of Community Resources:     [employer]:     Current Use:    If no, Barriers?:    Expressed Interest in [place] of Residence:  [place]  Patient Main Form of Transportation: Musician  Patient Strengths:  N/A  Patient Identified Areas of Improvement:  N/A  Patient Goal for Hospitalization:  Hope this brings my mind back and help with my anxiety and depression  Current SI (including self-harm):  No  Current HI:  No  Current AVH: No  Staff Intervention Plan: Group Attendance, Collaborate with Interdisciplinary Treatment Team  Consent to Intern Participation: N/A  Brach Birdsall 03/16/2019, 2:11 PM

## 2019-03-16 NOTE — Tx Team (Addendum)
Interdisciplinary Treatment and Diagnostic Plan Update  03/16/2019 Time of Session: 230p [name] MRN: [id number]  Principal Diagnosis: Severe recurrent major depression without psychotic features [facility])  Secondary Diagnoses: Principal Problem:   Severe recurrent major depression without psychotic features ([place]) Active Problems:   Type 2 diabetes mellitus with diabetic polyneuropathy, with long-term current use of insulin (HCC)   GERD (gastroesophageal reflux disease)   Hypertension   Obesity (BMI 30-39.9)   Constipation   History of hypothyroidism   Major depression   Current Medications:  Current Facility-Administered Medications  Medication Dose Route Frequency Provider Last Rate Last Dose  . acetaminophen (TYLENOL) tablet 650 mg  650 mg Oral Q6H PRN Clapacs, John T, MD      . alum & mag hydroxide-simeth (MAALOX/MYLANTA) 200-200-20 MG/5ML suspension 30 mL  30 mL Oral Q4H PRN Clapacs, John T, MD      . aspirin EC tablet 81 mg  81 mg Oral Daily Clapacs, John T, MD   81 mg at 03/16/19 1221  . atorvastatin (LIPITOR) tablet 80 mg  80 mg Oral q1800 Clapacs, Madie Reno, MD   80 mg at 03/15/19 1803  . docusate sodium (COLACE) capsule 200 mg  200 mg Oral BID Clapacs, John T, MD      . DULoxetine (CYMBALTA) DR capsule 60 mg  60 mg Oral Daily Clapacs, Madie Reno, MD   60 mg at 03/16/19 1215  . feeding supplement (ENSURE ENLIVE) (ENSURE ENLIVE) liquid 237 mL  237 mL Oral BID BM Clapacs, John T, MD      . insulin aspart (novoLOG) injection 0-15 Units  0-15 Units Subcutaneous TID WC Clapacs, Madie Reno, MD   Stopped at 03/16/19 0800  . irbesartan (AVAPRO) tablet 150 mg  150 mg Oral Daily Clapacs, Madie Reno, MD   150 mg at 03/16/19 1601  . LORazepam (ATIVAN) tablet 1 mg  1 mg Oral Q8H PRN Clapacs, John T, MD      . LORazepam (ATIVAN) tablet 1.5 mg  1.5 mg Oral QHS Clapacs, John T, MD   1.5 mg at 03/15/19 2127  . LORazepam (ATIVAN) tablet 1.5 mg  1.5 mg Oral BH-q7a Clapacs, Madie Reno, MD   1.5 mg at 03/16/19  0901  . magnesium hydroxide (MILK OF MAGNESIA) suspension 30 mL  30 mL Oral Daily PRN Clapacs, John T, MD      . menthol-cetylpyridinium (CEPACOL) lozenge 3 mg  1 lozenge Oral PRN Clapacs, John T, MD      . metFORMIN (GLUCOPHAGE-XR) 24 hr tablet 1,000 mg  1,000 mg Oral BID WC Clapacs, John T, MD   1,000 mg at 03/16/19 1215  . multivitamin with minerals tablet 1 tablet  1 tablet Oral Daily Clapacs, Madie Reno, MD   1 tablet at 03/16/19 1215  . ondansetron (ZOFRAN) injection 4 mg  4 mg Intravenous Once Clapacs, John T, MD      . pantoprazole (PROTONIX) EC tablet 40 mg  40 mg Oral BH-q7a Clapacs, Madie Reno, MD   40 mg at 03/16/19 1215  . polyethylene glycol (MIRALAX / GLYCOLAX) packet 17 g  17 g Oral Daily PRN Clapacs, John T, MD      . senna-docusate (Senokot-S) tablet 2 tablet  2 tablet Oral QHS Clapacs, Madie Reno, MD   2 tablet at 03/15/19 2128  . sertraline (ZOLOFT) tablet 25 mg  25 mg Oral Daily Clapacs, Madie Reno, MD   25 mg at 03/15/19 1804  . thiamine (VITAMIN B-1) tablet 100 mg  100  mg Oral Daily Clapacs, Madie Reno, MD   100 mg at 03/16/19 1238   PTA Medications: Medications Prior to Admission  Medication Sig Dispense Refill Last Dose  . aspirin (ASPIRIN LOW DOSE) 81 MG tablet Take 81 mg by mouth daily.    03/15/2019  . atorvastatin (LIPITOR) 80 MG tablet TAKE 1 TABLET (80 MG TOTAL) BY MOUTH DAILY AT 6 PM. 90 tablet 0 03/15/2019  . DULoxetine (CYMBALTA) 60 MG capsule Take 1 capsule (60 mg total) by mouth daily. 1 capsule 0 03/15/2019  . feeding supplement, ENSURE ENLIVE, (ENSURE ENLIVE) LIQD Take 237 mLs by mouth 2 (two) times daily between meals. 1 mL 0 03/15/2019  . hydrOXYzine (ATARAX/VISTARIL) 25 MG tablet Take 1 tablet (25 mg total) by mouth 3 (three) times daily as needed for anxiety. 1 tablet 0 03/15/2019  . irbesartan (AVAPRO) 150 MG tablet Take 1 tablet (150 mg total) by mouth daily. 1 tablet 0 03/15/2019  . LORazepam (ATIVAN) 0.5 MG tablet Take 3 tablets (1.5 mg total) by mouth at bedtime. 1 tablet 0  03/15/2019  . LORazepam (ATIVAN) 1 MG tablet Take 1 tablet (1 mg total) by mouth every 8 (eight) hours as needed (withdrawal symptoms). 1 tablet 0 03/15/2019  . metFORMIN (FORTAMET) 1000 MG (OSM) 24 hr tablet Take 1 tablet (1,000 mg total) by mouth 2 (two) times daily with a meal. 1 tablet 0 03/15/2019  . Multiple Vitamin (MULTIVITAMIN WITH MINERALS) TABS tablet Take 1 tablet by mouth daily. 1 tablet 0 03/15/2019  . nitrofurantoin, macrocrystal-monohydrate, (MACROBID) 100 MG capsule Take 1 capsule (100 mg total) by mouth every 12 (twelve) hours. 5 capsule 0 03/15/2019  . nortriptyline (PAMELOR) 25 MG capsule Take 1 capsule (25 mg total) by mouth at bedtime. 1 capsule 0 03/15/2019  . nortriptyline (PAMELOR) 25 MG capsule Take 1 capsule (25 mg total) by mouth daily. 1 capsule 0 03/15/2019  . Oxcarbazepine (TRILEPTAL) 300 MG tablet Take 1 tablet (300 mg total) by mouth 2 (two) times daily. 1 tablet 0 03/15/2019  . pantoprazole (PROTONIX) 40 MG tablet Take 1 tablet (40 mg total) by mouth every morning. 1 tablet 0 03/15/2019  . polyethylene glycol (MIRALAX / GLYCOLAX) 17 g packet Take 17 g by mouth daily as needed for mild constipation or moderate constipation. 1 each 0 03/15/2019  . thiamine 100 MG tablet Take 1 tablet (100 mg total) by mouth daily. 1 tablet 0 03/15/2019    Patient Stressors: Health problems Medication change or noncompliance  Patient Strengths: Ability for insight Average or above average intelligence Capable of independent living Communication skills Supportive family/friends  Treatment Modalities: Medication Management, Group therapy, Case management,  1 to 1 session with clinician, Psychoeducation, Recreational therapy.   Physician Treatment Plan for Primary Diagnosis: Severe recurrent major depression without psychotic features ([place]) Long Term Goal(s): Improvement in symptoms so as ready for discharge Improvement in symptoms so as ready for discharge   Short Term Goals: Ability  to verbalize feelings will improve Ability to disclose and discuss suicidal ideas Ability to demonstrate self-control will improve Ability to maintain clinical measurements within normal limits will improve Compliance with prescribed medications will improve  Medication Management: Evaluate patient's response, side effects, and tolerance of medication regimen.  Therapeutic Interventions: 1 to 1 sessions, Unit Group sessions and Medication administration.  Evaluation of Outcomes: Not Met  Physician Treatment Plan for Secondary Diagnosis: Principal Problem:   Severe recurrent major depression without psychotic features ([place]) Active Problems:   Type 2 diabetes mellitus with  diabetic polyneuropathy, with long-term current use of insulin (HCC)   GERD (gastroesophageal reflux disease)   Hypertension   Obesity (BMI 30-39.9)   Constipation   History of hypothyroidism   Major depression  Long Term Goal(s): Improvement in symptoms so as ready for discharge Improvement in symptoms so as ready for discharge   Short Term Goals: Ability to verbalize feelings will improve Ability to disclose and discuss suicidal ideas Ability to demonstrate self-control will improve Ability to maintain clinical measurements within normal limits will improve Compliance with prescribed medications will improve     Medication Management: Evaluate patient's response, side effects, and tolerance of medication regimen.  Therapeutic Interventions: 1 to 1 sessions, Unit Group sessions and Medication administration.  Evaluation of Outcomes: Not Met   RN Treatment Plan for Primary Diagnosis: Severe recurrent major depression without psychotic features ([place]) Long Term Goal(s): Knowledge of disease and therapeutic regimen to maintain health will improve  Short Term Goals: Ability to verbalize feelings will improve, Ability to disclose and discuss suicidal ideas, Ability to identify and develop effective coping  behaviors will improve and Compliance with prescribed medications will improve  Medication Management: RN will administer medications as ordered by provider, will assess and evaluate patient's response and provide education to patient for prescribed medication. RN will report any adverse and/or side effects to prescribing provider.  Therapeutic Interventions: 1 on 1 counseling sessions, Psychoeducation, Medication administration, Evaluate responses to treatment, Monitor vital signs and CBGs as ordered, Perform/monitor CIWA, COWS, AIMS and Fall Risk screenings as ordered, Perform wound care treatments as ordered.  Evaluation of Outcomes: Not Met   LCSW Treatment Plan for Primary Diagnosis: Severe recurrent major depression without psychotic features ([place]) Long Term Goal(s): Safe transition to appropriate next level of care at discharge, Engage patient in therapeutic group addressing interpersonal concerns.  Short Term Goals: Engage patient in aftercare planning with referrals and resources  Therapeutic Interventions: Assess for all discharge needs, 1 to 1 time with Social worker, Explore available resources and support systems, Assess for adequacy in community support network, Educate family and significant other(s) on suicide prevention, Complete Psychosocial Assessment, Interpersonal group therapy.  Evaluation of Outcomes: Not Met   Progress in Treatment: Attending groups: No. Participating in groups: No. Taking medication as prescribed: Yes. Toleration medication: Yes. Family/Significant other contact made: No, will contact:  when pt gives consent Patient understands diagnosis: Yes. Discussing patient identified problems/goals with staff: Yes. Medical problems stabilized or resolved: No. Denies suicidal/homicidal ideation: No. Issues/concerns per patient self-inventory: No. Other: NA  New problem(s) identified: No, Describe:  none reported  New Short Term/Long Term Goal(s): Attend  outpatient treatment, take medication as prescribed, develop and implement healthy coping methods to manage stress.  Patient Goals:  "To get ECT and get well"  Discharge Plan or Barriers: Pt will return home and receive outpatient treatment  Reason for Continuation of Hospitalization: Depression Medication stabilization  Estimated Length of Stay:5-7 days  Recreational Therapy: Patient Stressors: N/A  Patient Goal: Patient will engage in groups without prompting or encouragement from LRT x3 group sessions within 5 recreation therapy group sessions    Attendees: Patient:Jeanette Rose 03/16/2019 2:59 PM  Physician: Alethia Berthold 03/16/2019 2:59 PM  Nursing: Jerry Caras 03/16/2019 2:59 PM  RN Care Manager: 03/16/2019 2:59 PM  Social Worker: Darren Lehman Prom Assunta Curtis [place] 03/16/2019 2:59 PM  Recreational Therapist: Roanna Epley 03/16/2019 2:59 PM  Other:  03/16/2019 2:59 PM  Other:  03/16/2019 2:59 PM  Other: 03/16/2019 2:59 PM  Scribe for Treatment Team: Yvette Rack, Marlinda Mike 03/16/2019 2:59 PM

## 2019-03-16 NOTE — Progress Notes (Addendum)
Patient alert and oriented x 4, affect is blunted, but  She brightens upon approach, her thoughts are organized and coherent, she appears anxious and restless, ruminating about passive SI but when writer asked if she was suicidal she denies SI/HI/AVH, patient rated depression a 6/10 ( 0 low - 10 high ) she was noted pacing the unit very apprehensive. Patient was offered some emotional support and encouraged to attend evening wrap up group. Patient attends group and was appropriate, she was given medication for anxiety in addition the prescribed  Nighttime medication. 15 minutes safety minutes check maintained will continue to monitor.

## 2019-03-16 NOTE — Procedures (Signed)
ECT SERVICES Physician's Interval Evaluation & Treatment Note  Patient Identification: Jeanette Rose MRN:  [id number] Date of Evaluation:  03/16/2019 TX #: 1  MADRS: 38  MMSE: 30  P.E. Findings:  Anxious but nothing remarkable on the physical.  Chest x-ray normal.  Psychiatric Interval Note:  Extremely depressed and anxious  Subjective:  Patient is a 63 y.o. female seen for evaluation for Electroconvulsive Therapy. Frightened anxious depressed.  Not psychotic.  Treatment Summary:   [x]   Right Unilateral             []  Bilateral   % Energy : 0.3 ms 60%   Impedance: 2450 ohms  Seizure Energy Index: 4142 V squared  Postictal Suppression Index: 85%  Seizure Concordance Index: 97%  Medications  Pre Shock: Robinul 0.1 mg Zofran 4 mg Brevital 80 mg succinylcholine 100 mg  Post Shock:    Seizure Duration: About 34 seconds by EMG 64 seconds EEG   Comments: No complications no complaints of pain after treatment.  Lungs:  [x]   Clear to auscultation               []  Other:   Heart:    [x]   Regular rhythm             []  irregular rhythm    [x]   Previous H&P reviewed, patient examined and there are NO CHANGES                 []   Previous H&P reviewed, patient examined and there are changes noted.   Alethia Berthold, MD 6/19/20202:06 PM

## 2019-03-16 NOTE — Transfer of Care (Signed)
Immediate Anesthesia Transfer of Care Note  Patient: [facility]  Procedure(s) Performed: ECT TX  Patient Location: PACU  Anesthesia Type:General  Level of Consciousness: sedated  Airway & Oxygen Therapy: Patient Spontanous Breathing and Patient connected to face mask oxygen  Post-op Assessment: Report given to RN and Post -op Vital signs reviewed and stable  Post vital signs: Reviewed and stable  Last Vitals:  Vitals Value Taken Time  BP 151/87 03/16/19 1045  Temp 36.1 C 03/16/19 1045  Pulse 123 03/16/19 1048  Resp 16 03/16/19 1048  SpO2 100 % 03/16/19 1048  Vitals shown include unvalidated device data.  Last Pain:  Vitals:   03/16/19 1045  TempSrc:   PainSc: 0-No pain         Complications: No apparent anesthesia complications

## 2019-03-16 NOTE — H&P (Signed)
Jeanette Rose is an 63 y.o. female.   Chief Complaint: depression and anxiety HPI: severe derpession  Past Medical History:  Diagnosis Date  . Acute acalculous cholecystitis s/p lap chole 04/03/2014 04/02/2014  . Allergy   . Anemia   . Anxiety   . Asthma   . Depression   . Fatty liver    noted on CT per [facility] records  . Fibromyalgia   . GERD (gastroesophageal reflux disease)   . Hyperlipidemia associated with type 2 diabetes mellitus ([facility])   . Hypertension   . Hypothyroid 2015   Transiently in 2015. Synthroid 80mg was stopped 05/07/2016 w/ tsh nml following.  . Obesity (BMI 30-39.9)   . OSA (obstructive sleep apnea)    non-compliant with CPAP  . Seborrheic dermatitis    on face and scalp, seen at [facility]  . Stroke ([facility]    TIA  . Type 2 diabetes mellitus ([facility]     Past Surgical History:  Procedure Laterality Date  . BREAST REDUCTION SURGERY  06/2008  . CHOLECYSTECTOMY N/A 04/03/2014   Procedure: LAPAROSCOPIC CHOLECYSTECTOMY ;  Surgeon: Jeanette Hector MD;  Location: [facility];  Service: General;  Laterality: N/A;  . REDUCTION MAMMAPLASTY    . TUBAL LIGATION      Family History  Problem Relation Age of Onset  . Atrial fibrillation Mother   . Heart disease Father        CAD  . Diabetes Brother   . Cancer Maternal Grandmother   . Diabetes Paternal Grandmother   . Fibromyalgia Sister   . Colitis Sister    Social History:  reports that she quit smoking about 14 years ago. Her smoking use included cigarettes. She has a 60.00 pack-year smoking history. She has never used smokeless tobacco. She reports that she does not drink alcohol or use drugs.  Allergies:  Allergies  Allergen Reactions  . Farxiga [Dapagliflozin] Anxiety and Other (See Comments)    Made depression and anxiety worse  . Rexulti [Brexpiprazole] Anxiety and Other (See Comments)    Made anxiety worse  . Trintellix [Vortioxetine] Anxiety and Other (See Comments)    Made anxiety worse     Medications Prior to Admission  Medication Sig Dispense Refill  . aspirin (ASPIRIN LOW DOSE) 81 MG tablet Take 81 mg by mouth daily.     .Marland Kitchenatorvastatin (LIPITOR) 80 MG tablet TAKE 1 TABLET (80 MG TOTAL) BY MOUTH DAILY AT 6 PM. 90 tablet 0  . DULoxetine (CYMBALTA) 60 MG capsule Take 1 capsule (60 mg total) by mouth daily. 1 capsule 0  . feeding supplement, ENSURE ENLIVE, (ENSURE ENLIVE) LIQD Take 237 mLs by mouth 2 (two) times daily between meals. 1 mL 0  . hydrOXYzine (ATARAX/VISTARIL) 25 MG tablet Take 1 tablet (25 mg total) by mouth 3 (three) times daily as needed for anxiety. 1 tablet 0  . irbesartan (AVAPRO) 150 MG tablet Take 1 tablet (150 mg total) by mouth daily. 1 tablet 0  . LORazepam (ATIVAN) 0.5 MG tablet Take 3 tablets (1.5 mg total) by mouth at bedtime. 1 tablet 0  . LORazepam (ATIVAN) 1 MG tablet Take 1 tablet (1 mg total) by mouth every 8 (eight) hours as needed (withdrawal symptoms). 1 tablet 0  . metFORMIN (FORTAMET) 1000 MG (OSM) 24 hr tablet Take 1 tablet (1,000 mg total) by mouth 2 (two) times daily with a meal. 1 tablet 0  . Multiple Vitamin (MULTIVITAMIN WITH MINERALS) TABS tablet Take 1 tablet by mouth daily. 1  tablet 0  . nitrofurantoin, macrocrystal-monohydrate, (MACROBID) 100 MG capsule Take 1 capsule (100 mg total) by mouth every 12 (twelve) hours. 5 capsule 0  . nortriptyline (PAMELOR) 25 MG capsule Take 1 capsule (25 mg total) by mouth at bedtime. 1 capsule 0  . nortriptyline (PAMELOR) 25 MG capsule Take 1 capsule (25 mg total) by mouth daily. 1 capsule 0  . Oxcarbazepine (TRILEPTAL) 300 MG tablet Take 1 tablet (300 mg total) by mouth 2 (two) times daily. 1 tablet 0  . pantoprazole (PROTONIX) 40 MG tablet Take 1 tablet (40 mg total) by mouth every morning. 1 tablet 0  . polyethylene glycol (MIRALAX / GLYCOLAX) 17 g packet Take 17 g by mouth daily as needed for mild constipation or moderate constipation. 1 each 0  . thiamine 100 MG tablet Take 1 tablet (100 mg  total) by mouth daily. 1 tablet 0    Results for orders placed or performed during the hospital encounter of 03/15/19 (from the past 48 hour(s))  Glucose, capillary     Status: Abnormal   Collection Time: 03/15/19  5:39 PM  Result Value Ref Range   Glucose-Capillary 253 (H) 70 - 99 mg/dL  Glucose, capillary     Status: Abnormal   Collection Time: 03/16/19  6:39 AM  Result Value Ref Range   Glucose-Capillary 159 (H) 70 - 99 mg/dL   Dg Chest 2 View  Result Date: 03/15/2019 CLINICAL DATA:  Anaesthesia EXAM: CHEST - 2 VIEW COMPARISON:  None. FINDINGS: Heart and mediastinal contours are within normal limits. No focal opacities or effusions. No acute bony abnormality. IMPRESSION: No active cardiopulmonary disease. Electronically Signed   By: [facility] Rose M.D.   On: 03/15/2019 20:15    Review of Systems  Constitutional: Negative.   HENT: Negative.   Eyes: Negative.   Respiratory: Negative.   Cardiovascular: Negative.   Gastrointestinal: Negative.   Musculoskeletal: Negative.   Skin: Negative.   Neurological: Negative.   Psychiatric/Behavioral: Positive for depression, memory loss and suicidal ideas. Negative for hallucinations and substance abuse. The patient is nervous/anxious. The patient does not have insomnia.     Blood pressure (!) 164/82, pulse 100, temperature 97.8 F (36.6 C), temperature source Oral, resp. rate 18, height 5' 4"  (1.626 m), weight 76.7 kg, SpO2 98 %. Physical Exam  Nursing note and vitals reviewed. Cardiovascular: Regular rhythm.  Respiratory: No respiratory distress.  Psychiatric: Her mood appears anxious. Her speech is delayed. She is agitated. She is not aggressive. Cognition and memory are impaired. She expresses impulsivity. She exhibits a depressed mood. She expresses suicidal ideation. She expresses no suicidal plans.     Assessment/Plan Start index treatmenrt  Jeanette Berthold, MD 03/16/2019, 9:55 AM

## 2019-03-16 NOTE — Anesthesia Procedure Notes (Signed)
Date/Time: 03/16/2019 10:37 AM Performed by: Dionne Bucy, CRNA Pre-anesthesia Checklist: Patient identified, Emergency Drugs available, Suction available and Patient being monitored Patient Re-evaluated:Patient Re-evaluated prior to induction Oxygen Delivery Method: Circle system utilized Preoxygenation: Pre-oxygenation with 100% oxygen Induction Type: IV induction Ventilation: Mask ventilation without difficulty and Mask ventilation throughout procedure Airway Equipment and Method: Bite block Placement Confirmation: positive ETCO2 Dental Injury: Teeth and Oropharynx as per pre-operative assessment

## 2019-03-16 NOTE — Anesthesia Post-op Follow-up Note (Signed)
Anesthesia QCDR form completed.        

## 2019-03-17 LAB — GLUCOSE, CAPILLARY
Glucose-Capillary: 176 mg/dL — ABNORMAL HIGH (ref 70–99)
Glucose-Capillary: 199 mg/dL — ABNORMAL HIGH (ref 70–99)
Glucose-Capillary: 123 mg/dL — ABNORMAL HIGH (ref 70–99)
Glucose-Capillary: 144 mg/dL — ABNORMAL HIGH (ref 70–99)

## 2019-03-17 MED ORDER — HYDROCORTISONE 1 % EX CREA
TOPICAL_CREAM | Freq: Two times a day (BID) | CUTANEOUS | Status: DC | PRN
  Administered 2019-03-17: 1 via TOPICAL
  Filled 2019-03-17: qty 28

## 2019-03-17 MED ORDER — DOCUSATE SODIUM 100 MG PO CAPS
100.0000 mg | ORAL_CAPSULE | Freq: Two times a day (BID) | ORAL | Status: DC
  Administered 2019-03-17 – 2019-03-30 (×26): 100 mg via ORAL
  Filled 2019-03-17 (×27): qty 1

## 2019-03-17 MED ORDER — HYDROXYZINE HCL 50 MG PO TABS
50.0000 mg | ORAL_TABLET | Freq: Four times a day (QID) | ORAL | Status: DC | PRN
  Administered 2019-03-17 – 2019-03-26 (×8): 50 mg via ORAL
  Filled 2019-03-17 (×8): qty 1

## 2019-03-17 NOTE — Progress Notes (Signed)
Patient alert and oriented x 4, affect is blunted and assertive but she brightens upon approach, her thoughts are organized and coherent, she appears anxious and restless and nervous, she complained her lips trembling and hands tremors. Patient denies SI/HI/AVH and rated depression a 5/10 ( 0 low - 10 high ) she was given medication and assisted back to her room, also in addition she was offered emotional support and encouraged to attend evening wrap up group. Patient attends group and was appropriate,  15 minutes safety checks maintained will continue to monitor.

## 2019-03-17 NOTE — [facility] Group Notes (Signed)
LCSW Group Therapy Note  03/17/2019  1:00 PM   Type of Therapy and Topic:  Group Therapy:  Trust and Honesty   Participation Level:  Did Not Attend   Description of Group:    In this group patients will be asked to explore the value of being honest.  Patients will be guided to discuss their thoughts, feelings, and behaviors related to honesty and trusting in others. Patients will process together how trust and honesty relate to forming relationships with peers, family members, and self. Each patient will be challenged to identify and express feelings of being vulnerable. Patients will discuss reasons why people are dishonest and identify alternative outcomes if one was truthful (to self or others). This group will be process-oriented, with patients participating in exploration of their own experiences, giving and receiving support, and processing challenge from other group members.   Therapeutic Goals: 1. Patient will identify why honesty is important to relationships and how honesty overall affects relationships.  2. Patient will identify a situation where they lied or were lied too and the  feelings, thought process, and behaviors surrounding the situation 3. Patient will identify the meaning of being vulnerable, how that feels, and how that correlates to being honest with self and others. 4. Patient will identify situations where they could have told the truth, but instead lied and explain reasons of dishonesty.   Summary of Patient Progress:  X   Therapeutic Modalities:   Cognitive Behavioral Therapy Solution Focused Therapy Motivational Interviewing Brief Therapy  Assunta Curtis, MSW, LCSW 03/17/2019 12:24 PM

## 2019-03-17 NOTE — Plan of Care (Signed)
Pt. This morning endorses her mood and coping as not improved. Pt. Compliance with medications good. Pt. Denies si/hi/avh, able to contract for safety. Pt. Participation with unit activities this morning poor. Pt. Mostly isolative.    Problem: Activity: Goal: Interest or engagement in leisure activities will improve Outcome: Not Progressing   Problem: Coping: Goal: Coping ability will improve Outcome: Not Progressing Goal: Will verbalize feelings Outcome: Not Progressing   Problem: Education: Goal: Emotional status will improve Outcome: Not Progressing Goal: Mental status will improve Outcome: Not Progressing   Problem: Health Behavior/Discharge Planning: Goal: Compliance with therapeutic regimen will improve Outcome: Progressing   Problem: Safety: Goal: Ability to disclose and discuss suicidal ideas will improve Outcome: Progressing

## 2019-03-17 NOTE — Plan of Care (Signed)
  Problem: Coping: Goal: Will verbalize feelings Outcome: Progressing  Patient verbalized her feelings concerning her grieving her son that died over 8 years ago from drug overdose.

## 2019-03-17 NOTE — Progress Notes (Signed)
[facility] MD Progress Note  03/17/2019 12:30 PM [facility]  MRN:  [id number] Subjective: Follow-up for this patient with major depression.  Patient seen chart reviewed.  Patient's affect seems calm her today.  Much less anxious.  Smiles more.  Patient's chief complaint now is that instead of being constipated she is having frequent bowel movements and having discomfort in the rectal area.  Patient is not reporting any suicidal ideation.  Well-groomed.  No new health issues otherwise.  Sugars stable. Principal Problem: Severe recurrent major depression without psychotic features ([place]) Diagnosis: Principal Problem:   Severe recurrent major depression without psychotic features ([place]) Active Problems:   Type 2 diabetes mellitus with diabetic polyneuropathy, with long-term current use of insulin ([facility])   GERD (gastroesophageal reflux disease)   Hypertension   Obesity (BMI 30-39.9)   Constipation   History of hypothyroidism   Major depression  Total Time spent with patient: 30 minutes  Past Psychiatric History: Past history of depression much of it related to the death of her son but major depression even aside from that.  No previous ECT.  Past Medical History:  Past Medical History:  Diagnosis Date  . Acute acalculous cholecystitis s/p lap chole 04/03/2014 04/02/2014  . Allergy   . Anemia   . Anxiety   . Asthma   . Depression   . Fatty liver    noted on CT per [facility] records  . Fibromyalgia   . GERD (gastroesophageal reflux disease)   . Hyperlipidemia associated with type 2 diabetes mellitus ([place])   . Hypertension   . Hypothyroid 2015   Transiently in 2015. Synthroid 56mg was stopped 05/07/2016 w/ tsh nml following.  . Obesity (BMI 30-39.9)   . OSA (obstructive sleep apnea)    non-compliant with CPAP  . Seborrheic dermatitis    [facility]  . [facility]    TIA  . Type 2 diabetes mellitus ([place]     Past Surgical History:  Procedure Laterality  Date  . BREAST REDUCTION SURGERY  06/2008  . CHOLECYSTECTOMY N/A 04/03/2014   Procedure: LAPAROSCOPIC CHOLECYSTECTOMY ;  Surgeon: SAdin Hector MD;  Location: [facility];  Service: General;  Laterality: N/A;  . REDUCTION MAMMAPLASTY    . TUBAL LIGATION     Family History:  Family History  Problem Relation Age of Onset  . Atrial fibrillation Mother   . Heart disease Father        CAD  . Diabetes Brother   . Cancer Maternal Grandmother   . Diabetes Paternal Grandmother   . Fibromyalgia Sister   . Colitis Sister    Family Psychiatric  History: See previous Social History:  Social History   Substance and Sexual Activity  Alcohol Use No     Social History   Substance and Sexual Activity  Drug Use No    Social History   Socioeconomic History  . Marital status: Divorced    Spouse name: Not on file  . Number of children: Not on file  . Years of education: Not on file  . Highest education level: Not on file  Occupational History  . Not on file  Social Needs  . Financial resource strain: Not on file  . Food insecurity    Worry: Not on file    Inability: Not on file  . Transportation needs    Medical: Not on file    Non-medical: Not on file  Tobacco Use  . Smoking status: Former Smoker  Packs/day: 4.00    Years: 15.00    Pack years: 60.00    Types: Cigarettes    Quit date: 04/22/2004    Years since quitting: 14.9  . Smokeless tobacco: Never Used  . Tobacco comment: 4 pack year hx  Substance and Sexual Activity  . Alcohol use: No  . Drug use: No  . Sexual activity: Never  Lifestyle  . Physical activity    Days per week: Not on file    Minutes per session: Not on file  . Stress: Not on file  Relationships  . Social Herbalist on phone: Not on file    Gets together: Not on file    Attends religious service: Not on file    Active member of club or organization: Not on file    Attends meetings of clubs or organizations: Not on file    Relationship  status: Not on file  Other Topics Concern  . Not on file  Social History Narrative  . Not on file   Additional Social History:                         Sleep: Fair  Appetite:  Fair  Current Medications: Current Facility-Administered Medications  Medication Dose Route Frequency Provider Last Rate Last Dose  . acetaminophen (TYLENOL) tablet 650 mg  650 mg Oral Q6H PRN Weldon Nouri T, MD      . alum & mag hydroxide-simeth (MAALOX/MYLANTA) 200-200-20 MG/5ML suspension 30 mL  30 mL Oral Q4H PRN Carzell Saldivar T, MD      . aspirin EC tablet 81 mg  81 mg Oral Daily Todrick Siedschlag, Madie Reno, MD   81 mg at 03/17/19 0806  . atorvastatin (LIPITOR) tablet 80 mg  80 mg Oral q1800 Jedediah Noda, Madie Reno, MD   80 mg at 03/16/19 1637  . docusate sodium (COLACE) capsule 100 mg  100 mg Oral BID Mauri Temkin T, MD      . DULoxetine (CYMBALTA) DR capsule 60 mg  60 mg Oral Daily Orel Cooler, Madie Reno, MD   60 mg at 03/17/19 0806  . feeding supplement (ENSURE ENLIVE) (ENSURE ENLIVE) liquid 237 mL  237 mL Oral BID BM Jaylend Reiland T, MD      . hydrocortisone cream 1 %   Topical BID PRN Shannen Flansburg T, MD      . insulin aspart (novoLOG) injection 0-15 Units  0-15 Units Subcutaneous TID WC Jadamarie Butson, Madie Reno, MD   3 Units at 03/17/19 1159  . irbesartan (AVAPRO) tablet 150 mg  150 mg Oral Daily Audyn Dimercurio, Madie Reno, MD   150 mg at 03/17/19 0805  . LORazepam (ATIVAN) tablet 1 mg  1 mg Oral Q8H PRN Ryelynn Guedea, Madie Reno, MD   1 mg at 03/16/19 1949  . LORazepam (ATIVAN) tablet 1.5 mg  1.5 mg Oral QHS Ollen Rao T, MD   1.5 mg at 03/16/19 2244  . LORazepam (ATIVAN) tablet 1.5 mg  1.5 mg Oral BH-q7a Katarzyna Wolven, Madie Reno, MD   1.5 mg at 03/17/19 0805  . magnesium hydroxide (MILK OF MAGNESIA) suspension 30 mL  30 mL Oral Daily PRN Marcha Licklider T, MD      . menthol-cetylpyridinium (CEPACOL) lozenge 3 mg  1 lozenge Oral PRN Akiva Josey T, MD      . metFORMIN (GLUCOPHAGE-XR) 24 hr tablet 1,000 mg  1,000 mg Oral BID WC Tamya Denardo, Madie Reno, MD    1,000 mg at 03/17/19 0805  .  multivitamin with minerals tablet 1 tablet  1 tablet Oral Daily Jamaal Bernasconi, Madie Reno, MD   1 tablet at 03/17/19 0805  . ondansetron (ZOFRAN) injection 4 mg  4 mg Intravenous Once Jaquavis Felmlee T, MD      . pantoprazole (PROTONIX) EC tablet 40 mg  40 mg Oral BH-q7a Janan Bogie, Madie Reno, MD   40 mg at 03/17/19 [id number]  . sertraline (ZOLOFT) tablet 25 mg  25 mg Oral Daily Anastashia Westerfeld, Madie Reno, MD   25 mg at 03/17/19 0805  . thiamine (VITAMIN B-1) tablet 100 mg  100 mg Oral Daily Jazier Mcglamery, Madie Reno, MD   100 mg at 03/17/19 0092    Lab Results:  Results for orders placed or performed during the hospital encounter of 03/15/19 (from the past 48 hour(s))  Glucose, capillary     Status: Abnormal   Collection Time: 03/15/19  5:39 PM  Result Value Ref Range   Glucose-Capillary 253 (H) 70 - 99 mg/dL  Glucose, capillary     Status: Abnormal   Collection Time: 03/16/19  6:39 AM  Result Value Ref Range   Glucose-Capillary 159 (H) 70 - 99 mg/dL  Glucose, capillary     Status: Abnormal   Collection Time: 03/16/19  9:33 AM  Result Value Ref Range   Glucose-Capillary 180 (H) 70 - 99 mg/dL  Glucose, capillary     Status: Abnormal   Collection Time: 03/16/19 10:49 AM  Result Value Ref Range   Glucose-Capillary 161 (H) 70 - 99 mg/dL  Glucose, capillary     Status: Abnormal   Collection Time: 03/16/19  4:26 PM  Result Value Ref Range   Glucose-Capillary 208 (H) 70 - 99 mg/dL   Comment 1 Notify RN   Glucose, capillary     Status: Abnormal   Collection Time: 03/17/19  7:11 AM  Result Value Ref Range   Glucose-Capillary 176 (H) 70 - 99 mg/dL   Comment 1 Notify RN   Glucose, capillary     Status: Abnormal   Collection Time: 03/17/19 11:29 AM  Result Value Ref Range   Glucose-Capillary 199 (H) 70 - 99 mg/dL   Comment 1 Document in Chart     Blood Alcohol level:  Lab Results  Component Value Date   ETH <10 33/00/7622    Metabolic Disorder Labs: Lab Results  Component Value Date    HGBA1C 6.3 (H) 03/09/2019   MPG 134.11 03/09/2019   MPG 180 (H) 04/03/2014   No results found for: PROLACTIN Lab Results  Component Value Date   CHOL 174 10/09/2018   TRIG 210 (H) 10/09/2018   HDL 44 10/09/2018   CHOLHDL 4.0 10/09/2018   LDLCALC 88 10/09/2018   LDLCALC 77 06/28/2018    Physical Findings: AIMS:  , ,  ,  ,    CIWA:    COWS:     Musculoskeletal: Strength & Muscle Tone: within normal limits Gait & Station: normal Patient leans: N/A  Psychiatric Specialty Exam: Physical Exam  Nursing note and vitals reviewed. Constitutional: She appears well-developed and well-nourished.  HENT:  Head: Normocephalic and atraumatic.  Eyes: Pupils are equal, round, and reactive to light. Conjunctivae are normal.  Neck: Normal range of motion.  Cardiovascular: Regular rhythm and normal heart sounds.  Respiratory: Effort normal.  GI: Soft.  Musculoskeletal: Normal range of motion.  Neurological: She is alert.  Skin: Skin is warm and dry.  Psychiatric: Judgment normal. Her mood appears anxious. Her speech is delayed. She is slowed. Thought content is  not paranoid. Cognition and memory are normal. She exhibits a depressed mood. She expresses no homicidal and no suicidal ideation.    Review of Systems  Constitutional: Negative.   HENT: Negative.   Eyes: Negative.   Respiratory: Negative.   Cardiovascular: Negative.   Gastrointestinal: Positive for diarrhea.  Musculoskeletal: Negative.   Skin: Negative.   Neurological: Negative.   Psychiatric/Behavioral: Positive for depression. Negative for hallucinations, memory loss, substance abuse and suicidal ideas. The patient is nervous/anxious. The patient does not have insomnia.     Blood pressure 134/82, pulse 100, temperature 98.1 F (36.7 C), temperature source Oral, resp. rate 18, height 5' 4"  (1.626 m), weight 76.7 kg, SpO2 96 %.Body mass index is 29.01 kg/m.  General Appearance: Casual  Eye Contact:  Fair  Speech:  Clear  and Coherent  Volume:  Decreased  Mood:  Euthymic  Affect:  Constricted  Thought Process:  Goal Directed  Orientation:  Full (Time, Place, and Person)  Thought Content:  Logical  Suicidal Thoughts:  No  Homicidal Thoughts:  No  Memory:  Immediate;   Fair Recent;   Fair Remote;   Fair  Judgement:  Fair  Insight:  Fair  Psychomotor Activity:  Decreased  Concentration:  Concentration: Fair  Recall:  [employer] of Knowledge:  Fair  Language:  Fair  Akathisia:  No  Handed:  Right  AIMS (if indicated):     Assets:  Desire for Improvement Housing Resilience  ADL's:  Intact  Cognition:  WNL  Sleep:  Number of Hours: 7.3     Treatment Plan Summary: Daily contact with patient to assess and evaluate symptoms and progress in treatment, Medication management and Plan I am optimistic that the patient may be on her way to having a good response to ECT.  I am going to decrease some of the medicines that were added when she was concerned about constipation including discontinuing the MiraLAX and standing Senokot.  Decrease the dose of Colace.  Added some hydrocortisone cream for rectal discomfort.  No other change to medication for today.  Supportive counseling and psychoeducation.  Alethia Berthold, MD 03/17/2019, 12:30 PM

## 2019-03-17 NOTE — Progress Notes (Addendum)
D: Pt during assessments is pleasant, but visibly depressed and anxious. Pt. Denies si/hi/avh, able to contract for safety. Pt. Is isolative and withdrawn to room resting. Pt. Complains of some pain, but refuses medication from orders. Pt. Is guarded. Pt. Expresses depression and anxiety are present and not improving.    A: Q x 15 minute observation checks to be completed for safety. Patient was provided with education.  Patient was given/offered medications per orders. Patient was encouraged to attend groups, participate in unit activities and continue with plan of care for the day. Pt. Chart and plans of care reviewed. Pt. Given support and encouragement.   R: Patient is complaint with medications and unit procedures this morning, but attendance besides breakfast is poor. Pt. Is observed eating good. Pt. Interactions with staff appropriate, but minimal. Pt. Blood sugars monitored per MD orders.

## 2019-03-18 ENCOUNTER — Other Ambulatory Visit: Payer: Self-pay | Admitting: Psychiatry

## 2019-03-18 LAB — GLUCOSE, CAPILLARY
Glucose-Capillary: 189 mg/dL — ABNORMAL HIGH (ref 70–99)
Glucose-Capillary: 193 mg/dL — ABNORMAL HIGH (ref 70–99)
Glucose-Capillary: 120 mg/dL — ABNORMAL HIGH (ref 70–99)
Glucose-Capillary: 142 mg/dL — ABNORMAL HIGH (ref 70–99)

## 2019-03-18 MED ORDER — LORAZEPAM 1 MG PO TABS
1.0000 mg | ORAL_TABLET | Freq: Two times a day (BID) | ORAL | Status: DC
  Administered 2019-03-18 – 2019-03-19 (×2): 1 mg via ORAL
  Filled 2019-03-18 (×3): qty 1

## 2019-03-18 NOTE — Progress Notes (Signed)
D: Patient denies SI, HI and AVH. Contracting for safety. Isolative to room. Various somatic complaints. Thinks zoloft is making her bite her lip and itch in different places. Up during night to ask about changing thermostat in her room. A: continue to monitor for safety R: Safety maintained.

## 2019-03-18 NOTE — Progress Notes (Signed)
Patient just came to this writer and stated that she doesn't like the way she's feeling, "my anxiety is through the roof and I'm having thoughts of killing myself". This Probation officer asked patient what makes her feel better or what can we as staff do in order to help her and she stated that she wants to speak with the MD, however, he is with another patient at this time. Patient also stated that she normally talks to her sister, so this Probation officer allowed patient to call and speak with her sister to help calm her down.

## 2019-03-18 NOTE — Plan of Care (Signed)
D- Patient alert and oriented. Patient presents in a pleasant mood on assessment stating that she didn't sleep well last night because it was too hot in her room. Patient rated her neck pain a "3/10", however, she did not request any pain medication from this Probation officer. Patient denies any depression, but reported that her anxiety was off the wall, stating "I suffer from anxiety", but she could not pinpoint what is making her feel this way. Patient denies SI, HI, AVH at this time. Patient had no stated goals for today.  A- Scheduled medications administered to patient, per MD orders. Support and encouragement provided.  Routine safety checks conducted every 15 minutes.  Patient informed to notify staff with problems or concerns.   R- No adverse drug reactions noted. Patient contracts for safety at this time. Patient compliant with medications and treatment plan. Patient receptive, calm, and cooperative. Patient interacts well with others on the unit.  Patient remains safe at this time.  Problem: Education: Goal: Utilization of techniques to improve thought processes will improve Outcome: Progressing Goal: Knowledge of the prescribed therapeutic regimen will improve Outcome: Progressing   Problem: Activity: Goal: Interest or engagement in leisure activities will improve Outcome: Progressing Goal: Imbalance in normal sleep/wake cycle will improve Outcome: Progressing   Problem: Coping: Goal: Coping ability will improve Outcome: Progressing Goal: Will verbalize feelings Outcome: Progressing   Problem: Health Behavior/Discharge Planning: Goal: Ability to make decisions will improve Outcome: Progressing Goal: Compliance with therapeutic regimen will improve Outcome: Progressing   Problem: Role Relationship: Goal: Will demonstrate positive changes in social behaviors and relationships Outcome: Progressing   Problem: Safety: Goal: Ability to disclose and discuss suicidal ideas will  improve Outcome: Progressing Goal: Ability to identify and utilize support systems that promote safety will improve Outcome: Progressing   Problem: Self-Concept: Goal: Will verbalize positive feelings about self Outcome: Progressing Goal: Level of anxiety will decrease Outcome: Progressing   Problem: Education: Goal: Knowledge of [facility] Education information/materials will improve Outcome: Progressing Goal: Emotional status will improve Outcome: Progressing Goal: Mental status will improve Outcome: Progressing Goal: Verbalization of understanding the information provided will improve Outcome: Progressing   Problem: Self-Concept: Goal: Level of anxiety will decrease Outcome: Progressing

## 2019-03-18 NOTE — Progress Notes (Signed)
Patient stated to this writer "I don't know what's wrong with me, I don't have any feelings, I can't cry, I just want my feelings back".

## 2019-03-18 NOTE — Progress Notes (Addendum)
Patient voiced to this writer that she would like some ice water at night because her room gets so hot and ice water is what helps cool her down, "but I would need 4-5 cups". Patient is also preoccupied with getting nausea medication before ECT tomorrow. Patient was reassured by this writer that there would be standing orders when she goes up for ECT for her to receive medication for nausea.

## 2019-03-18 NOTE — [facility] Suicide Risk Assessment (Signed)
[place] INPATIENT:  Family/Significant Other Suicide Prevention Education  Suicide Prevention Education:  Education Completed; [name], sister [phone] has been identified by the patient as the family member/significant other with whom the patient will be residing, and identified as the person(s) who will aid the patient in the event of a mental health crisis (suicidal ideations/suicide attempt).  With written consent from the patient, the family member/significant other has been provided the following suicide prevention education, prior to the and/or following the discharge of the patient.  The suicide prevention education provided includes the following:  Suicide risk factors  Suicide prevention and interventions  National Suicide Hotline telephone number  [facility] assessment telephone number  [facility] Emergency Assistance [place] and/or Residential Mobile Crisis Unit telephone number  Request made of family/significant other to:  Remove weapons (e.g., guns, rifles, knives), all items previously/currently identified as safety concern.    Remove drugs/medications (over-the-counter, prescriptions, illicit drugs), all items previously/currently identified as a safety concern.  The family member/significant other verbalizes understanding of the suicide prevention education information provided.  The family member/significant other agrees to remove the items of safety concern listed above.  CSW spoke with pts sister, Helene Kelp who reported that pt is in the hospital due to her son passing in 2011 and pt being the one who found her son and never coping with that. Per Helene Kelp, pt was really close with her daughter and grandchildren, but now they do not have a relationship and that is affecting pt. Helene Kelp reported she had concerns with SI for pt when she brought pt to the hospital, but is unsure now about whether or not she should be concerned with SI. Helene Kelp  denied concerns with HI and no knowledge of guns/weapons in the home. Helene Kelp expressed concerns with pt returning home depending on how pt is doing at discharge. Per Helene Kelp, pt has a lot of things to do when she gets out and reported that pts house looks like an episode of hoarders.   Helene Kelp reported that pt sees Dr. Toy Care and from her understanding Dr. Toy Care is the only psychiatrist in her area who takes pts insurance. Helene Kelp reported she will pick pt up at discharge.   [place] MSW LCSW 03/18/2019, 10:41 AM

## 2019-03-18 NOTE — [facility] Group Notes (Signed)
LCSW Group Therapy Note  03/18/2019 9:09 AM   Type of Therapy and Topic: Group Therapy: Feelings Around Returning Home & Establishing a Supportive Framework and Supporting Oneself When Supports Not Available   Participation Level:  None   Description of Group:  Patients first processed thoughts and feelings about upcoming discharge. These included fears of upcoming changes, lack of change, new living environments, judgements and expectations from others and overall stigma of mental health issues. The group then discussed the definition of a supportive framework, what that looks and feels like, and how do to discern it from an unhealthy non-supportive network. The group identified different types of supports as well as what to do when your family/friends are less than helpful or unavailable   Therapeutic Goals  1. Patient will identify one healthy supportive network that they can use at discharge. 2. Patient will identify one factor of a supportive framework and how to tell it from an unhealthy network. 3. Patient able to identify one coping skill to use when they do not have positive supports from others. 4. Patient will demonstrate ability to communicate their needs through discussion and/or role plays.   Summary of Patient Progress:  Pt was present in group, but did not engage in the conversation. Pt struggled to identify coping skills and positive affirmations when asked, stating "I don't know". Pt discussed feeling "out of it" at the end of group saying it is hard for her to make decisions.    Therapeutic Modalities Cognitive Behavioral Therapy Motivational Interviewing    Jeanette Rose, MSW, LCSW Clinical Social Work 03/18/2019 9:09 AM

## 2019-03-18 NOTE — Progress Notes (Signed)
[facility] MD Progress Note  03/18/2019 11:59 AM Jeanette Rose  MRN:  [id number] Subjective: Follow-up for patient with major depression and anxiety.  Patient complains of feeling much more anxious today.  She also says she is having suicidal thoughts although she denies any intention plan or even formed thought of how she would do it in the hospital.  Patient is very fixated on medication and specific ups and downs of medicine.  Overall anxiety seems to be about the same possibly a little better than when she first arrived here.  Physically doing fine.  Blood sugars a little bit elevated but not terrible.  After ECT tomorrow may try starting a little bit of mixed insulin in the morning. Principal Problem: Severe recurrent major depression without psychotic features ([place]) Diagnosis: Principal Problem:   Severe recurrent major depression without psychotic features ([place]) Active Problems:   Type 2 diabetes mellitus with diabetic polyneuropathy, with long-term current use of insulin (HCC)   GERD (gastroesophageal reflux disease)   Hypertension   Obesity (BMI 30-39.9)   Constipation   History of hypothyroidism   Major depression  Total Time spent with patient: 30 minutes  Past Psychiatric History: Patient has a history of depression and anxiety.  Currently in the hospital for ECT because of poor response to medicine  Past Medical History:  Past Medical History:  Diagnosis Date  . Acute acalculous cholecystitis s/p lap chole 04/03/2014 04/02/2014  . Allergy   . Anemia   . Anxiety   . Asthma   . Depression   . Fatty liver    noted on CT per [facility] records  . Fibromyalgia   . GERD (gastroesophageal reflux disease)   . Hyperlipidemia associated with type 2 diabetes mellitus ([place])   . Hypertension   . Hypothyroid 2015   Transiently in 2015. Synthroid 36mg was stopped 05/07/2016 w/ tsh nml following.  . Obesity (BMI 30-39.9)   . OSA (obstructive sleep apnea)    non-compliant with CPAP  . Seborrheic  dermatitis    [facility]  . Stroke ([facility]    TIA  . Type 2 diabetes mellitus ([facility]     Past Surgical History:  Procedure Laterality Date  . BREAST REDUCTION SURGERY  06/2008  . CHOLECYSTECTOMY N/A 04/03/2014   Procedure: LAPAROSCOPIC CHOLECYSTECTOMY ;  Surgeon: SAdin Hector MD;  Location: [facility] ORS;  Service: General;  Laterality: N/A;  . REDUCTION MAMMAPLASTY    . TUBAL LIGATION     Family History:  Family History  Problem Relation Age of Onset  . Atrial fibrillation Mother   . Heart disease Father        CAD  . Diabetes Brother   . Cancer Maternal Grandmother   . Diabetes Paternal Grandmother   . Fibromyalgia Sister   . Colitis Sister    Family Psychiatric  History: Family history of chronic depression Social History:  Social History   Substance and Sexual Activity  Alcohol Use No     Social History   Substance and Sexual Activity  Drug Use No    Social History   Socioeconomic History  . Marital status: Divorced    Spouse name: Not on file  . Number of children: Not on file  . Years of education: Not on file  . Highest education level: Not on file  Occupational History  . Not on file  Social Needs  . Financial resource strain: Not on file  . Food insecurity  Worry: Not on file    Inability: Not on file  . Transportation needs    Medical: Not on file    Non-medical: Not on file  Tobacco Use  . Smoking status: Former Smoker    Packs/day: 4.00    Years: 15.00    Pack years: 60.00    Types: Cigarettes    Quit date: 04/22/2004    Years since quitting: 14.9  . Smokeless tobacco: Never Used  . Tobacco comment: 4 pack year hx  Substance and Sexual Activity  . Alcohol use: No  . Drug use: No  . Sexual activity: Never  Lifestyle  . Physical activity    Days per week: Not on file    Minutes per session: Not on file  . Stress: Not on file  Relationships  . Social Herbalist on phone: Not on file     Gets together: Not on file    Attends religious service: Not on file    Active member of club or organization: Not on file    Attends meetings of clubs or organizations: Not on file    Relationship status: Not on file  Other Topics Concern  . Not on file  Social History Narrative  . Not on file   Additional Social History:                         Sleep: Fair  Appetite:  Fair  Current Medications: Current Facility-Administered Medications  Medication Dose Route Frequency Provider Last Rate Last Dose  . acetaminophen (TYLENOL) tablet 650 mg  650 mg Oral Q6H PRN ,  T, MD      . alum & mag hydroxide-simeth (MAALOX/MYLANTA) 200-200-20 MG/5ML suspension 30 mL  30 mL Oral Q4H PRN ,  T, MD      . aspirin EC tablet 81 mg  81 mg Oral Daily ,  T, MD   81 mg at 03/18/19 0800  . atorvastatin (LIPITOR) tablet 80 mg  80 mg Oral q1800 , Madie Reno, MD   80 mg at 03/17/19 1703  . docusate sodium (COLACE) capsule 100 mg  100 mg Oral BID , Madie Reno, MD   100 mg at 03/18/19 7425  . DULoxetine (CYMBALTA) DR capsule 60 mg  60 mg Oral Daily ,  T, MD   60 mg at 03/18/19 0800  . feeding supplement (ENSURE ENLIVE) (ENSURE ENLIVE) liquid 237 mL  237 mL Oral BID BM ,  T, MD   237 mL at 03/17/19 1935  . hydrocortisone cream 1 %   Topical BID PRN , Madie Reno, MD   1 application at 95/63/87 1601  . hydrOXYzine (ATARAX/VISTARIL) tablet 50 mg  50 mg Oral Q6H PRN , Madie Reno, MD   50 mg at 03/18/19 1126  . insulin aspart (novoLOG) injection 0-15 Units  0-15 Units Subcutaneous TID WC , Madie Reno, MD   3 Units at 03/18/19 0800  . irbesartan (AVAPRO) tablet 150 mg  150 mg Oral Daily ,  T, MD   150 mg at 03/18/19 0800  . LORazepam (ATIVAN) tablet 1 mg  1 mg Oral Q8H PRN , Madie Reno, MD   1 mg at 03/16/19 1949  . LORazepam (ATIVAN) tablet 1 mg  1 mg Oral BID ,  T, MD      . magnesium hydroxide (MILK OF MAGNESIA)  suspension 30 mL  30 mL Oral Daily PRN , Madie Reno, MD      .  menthol-cetylpyridinium (CEPACOL) lozenge 3 mg  1 lozenge Oral PRN ,  T, MD      . metFORMIN (GLUCOPHAGE-XR) 24 hr tablet 1,000 mg  1,000 mg Oral BID WC ,  T, MD   1,000 mg at 03/18/19 0800  . multivitamin with minerals tablet 1 tablet  1 tablet Oral Daily , Madie Reno, MD   1 tablet at 03/18/19 0800  . ondansetron (ZOFRAN) injection 4 mg  4 mg Intravenous Once ,  T, MD      . pantoprazole (PROTONIX) EC tablet 40 mg  40 mg Oral BH-q7a , Madie Reno, MD   40 mg at 03/18/19 0045  . thiamine (VITAMIN B-1) tablet 100 mg  100 mg Oral Daily ,  T, MD   100 mg at 03/18/19 0800    Lab Results:  Results for orders placed or performed during the hospital encounter of 03/15/19 (from the past 48 hour(s))  Glucose, capillary     Status: Abnormal   Collection Time: 03/16/19  4:26 PM  Result Value Ref Range   Glucose-Capillary 208 (H) 70 - 99 mg/dL   Comment 1 Notify RN   Glucose, capillary     Status: Abnormal   Collection Time: 03/17/19  7:11 AM  Result Value Ref Range   Glucose-Capillary 176 (H) 70 - 99 mg/dL   Comment 1 Notify RN   Glucose, capillary     Status: Abnormal   Collection Time: 03/17/19 11:29 AM  Result Value Ref Range   Glucose-Capillary 199 (H) 70 - 99 mg/dL   Comment 1 Document in Chart   Glucose, capillary     Status: Abnormal   Collection Time: 03/17/19  4:22 PM  Result Value Ref Range   Glucose-Capillary 123 (H) 70 - 99 mg/dL  Glucose, capillary     Status: Abnormal   Collection Time: 03/17/19  9:59 PM  Result Value Ref Range   Glucose-Capillary 144 (H) 70 - 99 mg/dL  Glucose, capillary     Status: Abnormal   Collection Time: 03/18/19  7:09 AM  Result Value Ref Range   Glucose-Capillary 189 (H) 70 - 99 mg/dL   Comment 1 Notify RN   Glucose, capillary     Status: Abnormal   Collection Time: 03/18/19 11:28 AM  Result Value Ref Range   Glucose-Capillary 193  (H) 70 - 99 mg/dL    Blood Alcohol level:  Lab Results  Component Value Date   ETH <10 99/77/4142    Metabolic Disorder Labs: Lab Results  Component Value Date   HGBA1C 6.3 (H) 03/09/2019   MPG 134.11 03/09/2019   MPG 180 (H) 04/03/2014   No results found for: PROLACTIN Lab Results  Component Value Date   CHOL 174 10/09/2018   TRIG 210 (H) 10/09/2018   HDL 44 10/09/2018   CHOLHDL 4.0 10/09/2018   LDLCALC 88 10/09/2018   LDLCALC 77 06/28/2018    Physical Findings: AIMS:  , ,  ,  ,    CIWA:    COWS:     Musculoskeletal: Strength & Muscle Tone: within normal limits Gait & Station: normal Patient leans: N/A  Psychiatric Specialty Exam: Physical Exam  Nursing note and vitals reviewed. Constitutional: She appears well-developed and well-nourished.  HENT:  Head: Normocephalic and atraumatic.  Eyes: Pupils are equal, round, and reactive to light. Conjunctivae are normal.  Neck: Normal range of motion.  Cardiovascular: Regular rhythm and normal heart sounds.  Respiratory: Effort normal.  GI: Soft.  Musculoskeletal: Normal range of motion.  Neurological: She is alert.  Skin: Skin is warm and dry.  Psychiatric: Her mood appears anxious. Her speech is delayed. She is slowed. Cognition and memory are impaired. She expresses impulsivity. She expresses suicidal ideation. She expresses no suicidal plans.    Review of Systems  Constitutional: Negative.   HENT: Negative.   Eyes: Negative.   Respiratory: Negative.   Cardiovascular: Negative.   Gastrointestinal: Negative.   Musculoskeletal: Negative.   Skin: Negative.   Neurological: Negative.   Psychiatric/Behavioral: Positive for depression and suicidal ideas. Negative for hallucinations and substance abuse. The patient is nervous/anxious.     Blood pressure 124/82, pulse (!) 111, temperature 97.8 F (36.6 C), temperature source Oral, resp. rate 17, height 5' 4"  (1.626 m), weight 76.7 kg, SpO2 96 %.Body mass index is  29.01 kg/m.  General Appearance: Casual  Eye Contact:  Fair  Speech:  Clear and Coherent  Volume:  Normal  Mood:  Anxious  Affect:  Congruent  Thought Process:  Goal Directed  Orientation:  Full (Time, Place, and Person)  Thought Content:  Tangential  Suicidal Thoughts:  Yes.  without intent/plan  Homicidal Thoughts:  No  Memory:  Immediate;   Fair Recent;   Fair Remote;   Fair  Judgement:  Impaired  Insight:  Shallow  Psychomotor Activity:  Restlessness  Concentration:  Concentration: Fair  Recall:  [employer] of Knowledge:  Fair  Language:  Fair  Akathisia:  No  Handed:  Right  AIMS (if indicated):     Assets:  Desire for Improvement Housing  ADL's:  Intact  Cognition:  Impaired,  Mild  Sleep:  Number of Hours: 6.75     Treatment Plan Summary: Daily contact with patient to assess and evaluate symptoms and progress in treatment, Medication management and Plan Patient is back to overwhelming general anxiety with a somatic tinge to it and an obsession with details of the medicine.  In my experience this sort of the presentation is usually indicative of a person displacing the her anxiety on to the medicines.  I educated her about how antidepressants took weeks usually to have any effect and that she is overstating any positive effect that she may have had for medicines before given what a bad condition she was when she came into the hospital.  Because of this I much prefer to be conservative.  I am happy to discontinue the Zoloft because that is what she is most nervous about although I also told her I thought that it was of minimal relevance to her suicidal thinking.  I clarified that her suicidal thoughts were not showing any signs of acute danger in the hospital.  I declined to start any new antidepressant medicine.  At her request we will continue the slight taper of Ativan.  As I mentioned above may start some mixed or long-acting insulin after ECT tomorrow.  Alethia Berthold,  MD 03/18/2019, 11:59 AM

## 2019-03-18 NOTE — Plan of Care (Signed)
  Problem: Education: Goal: Utilization of techniques to improve thought processes will improve Outcome: Progressing Goal: Knowledge of the prescribed therapeutic regimen will improve Outcome: Progressing  D: Patient denies SI, HI and AVH. Contracting for safety. Isolative to room. Various somatic complaints. Thinks zoloft is making her bite her lip and itch in different places. Up during night to ask about changing thermostat in her room. A: continue to monitor for safety R: Safety maintained.

## 2019-03-19 ENCOUNTER — Encounter: Payer: Self-pay | Admitting: *Deleted

## 2019-03-19 ENCOUNTER — Inpatient Hospital Stay: Payer: BLUE CROSS/BLUE SHIELD | Admitting: Certified Registered Nurse Anesthetist

## 2019-03-19 ENCOUNTER — Encounter: Payer: BLUE CROSS/BLUE SHIELD | Attending: Psychiatry

## 2019-03-19 DIAGNOSIS — F329 Major depressive disorder, single episode, unspecified: Secondary | ICD-10-CM | POA: Insufficient documentation

## 2019-03-19 DIAGNOSIS — F419 Anxiety disorder, unspecified: Secondary | ICD-10-CM | POA: Insufficient documentation

## 2019-03-19 LAB — GLUCOSE, CAPILLARY
Glucose-Capillary: 192 mg/dL — ABNORMAL HIGH (ref 70–99)
Glucose-Capillary: 202 mg/dL — ABNORMAL HIGH (ref 70–99)

## 2019-03-19 MED ORDER — FENTANYL CITRATE (PF) 100 MCG/2ML IJ SOLN: 25.0000 ug | INTRAMUSCULAR | Status: DC | PRN

## 2019-03-19 MED ORDER — SUCCINYLCHOLINE CHLORIDE 20 MG/ML IJ SOLN
INTRAMUSCULAR | Status: AC
  Filled 2019-03-19: qty 1

## 2019-03-19 MED ORDER — MIDAZOLAM HCL 2 MG/2ML IJ SOLN
INTRAMUSCULAR | Status: AC
  Filled 2019-03-19: qty 2

## 2019-03-19 MED ORDER — SODIUM CHLORIDE 0.9 % IV SOLN
INTRAVENOUS | Status: DC | PRN
  Administered 2019-03-19: 10:00:00 via INTRAVENOUS

## 2019-03-19 MED ORDER — ONDANSETRON HCL 4 MG/2ML IJ SOLN: 4.0000 mg | Freq: Once | INTRAMUSCULAR | Status: DC | PRN

## 2019-03-19 MED ORDER — METHOHEXITAL SODIUM 100 MG/10ML IV SOSY
PREFILLED_SYRINGE | INTRAVENOUS | Status: DC | PRN
  Administered 2019-03-19: 80 mg via INTRAVENOUS

## 2019-03-19 MED ORDER — MIDAZOLAM HCL 2 MG/2ML IJ SOLN
INTRAMUSCULAR | Status: DC | PRN
  Administered 2019-03-19: 2 mg via INTRAVENOUS

## 2019-03-19 MED ORDER — LORAZEPAM 1 MG PO TABS
1.5000 mg | ORAL_TABLET | Freq: Two times a day (BID) | ORAL | Status: DC
  Administered 2019-03-20 – 2019-03-23 (×7): 1.5 mg via ORAL
  Filled 2019-03-19 (×7): qty 1

## 2019-03-19 MED ORDER — KETOROLAC TROMETHAMINE 30 MG/ML IJ SOLN: 30.0000 mg | Freq: Once | INTRAMUSCULAR | Status: AC

## 2019-03-19 MED ORDER — GLYCOPYRROLATE 0.2 MG/ML IJ SOLN
INTRAMUSCULAR | Status: AC
  Filled 2019-03-19: qty 1

## 2019-03-19 MED ORDER — ONDANSETRON HCL 4 MG/2ML IJ SOLN: 4.0000 mg | Freq: Once | INTRAMUSCULAR | Status: DC

## 2019-03-19 MED ORDER — METHOHEXITAL SODIUM 0.5 G IJ SOLR
INTRAMUSCULAR | Status: AC
  Filled 2019-03-19: qty 500

## 2019-03-19 MED ORDER — SUCCINYLCHOLINE CHLORIDE 200 MG/10ML IV SOSY
PREFILLED_SYRINGE | INTRAVENOUS | Status: DC | PRN
  Administered 2019-03-19: 100 mg via INTRAVENOUS

## 2019-03-19 MED ORDER — GLYCOPYRROLATE 0.2 MG/ML IJ SOLN
0.1000 mg | Freq: Once | INTRAMUSCULAR | Status: AC
  Administered 2019-03-19: 0.1 mg via INTRAVENOUS

## 2019-03-19 MED ORDER — MIDAZOLAM HCL 2 MG/2ML IJ SOLN
2.0000 mg | Freq: Once | INTRAMUSCULAR | Status: AC
  Administered 2019-03-23: 2 mg via INTRAVENOUS

## 2019-03-19 MED ORDER — ONDANSETRON HCL 4 MG/2ML IJ SOLN
INTRAMUSCULAR | Status: AC
  Filled 2019-03-19: qty 2

## 2019-03-19 MED ORDER — SODIUM CHLORIDE 0.9 % IV SOLN
500.0000 mL | Freq: Once | INTRAVENOUS | Status: AC
  Administered 2019-03-19: 500 mL via INTRAVENOUS

## 2019-03-19 NOTE — [facility] Counselor (Signed)
Adult Comprehensive Assessment  Patient ID: Jeanette Rose, female   DOB: 1956/02/14, 63 y.o.   MRN: [id number]  Information Source: Information source: Patient  Current Stressors:  Patient states their primary concerns and needs for treatment are:: "My family was afraid I was going to hurt myself"  Patient states their goals for this hospitilization and ongoing recovery are:: Medication adjustments; decrease/eliminate suicidal ideation; Decrease anxiety symptoms Educational / Learning stressors: N/A  Employment / Job issues: On disability; Patient denies any stressors  Family Relationships: Patient reports she has a distant relationship with her daughter and grandson; She reports "I dont know why she barely deals with me"  Financial / Lack of resources (include bankruptcy): Limited income; SSDI; Patient reports she struggles paying for her outpatient follow up provider due to costly co-pay.  Housing / Lack of housing: Lives alone in [place], [place]; Patient shared that her son's belongings are still in the house and that when she looks at them she becomes "very overwhelmed"  Physical health (include injuries & life threatening diseases): Diabetes II Social relationships: Patient reports having no social relationships at this time.  Substance abuse: Patient denies any substance use  Bereavement / Loss: Patient reports she continues to struggle with the passing of her son 8 1/2 years ago; Patient reports she does not know how to cope with her son's death; PTSD    Living/Environment/Situation:  Living Arrangements: Alone Living conditions (as described by patient or guardian): "Good"  Who else lives in the home?: Alone  How long has patient lived in current situation?: 11 years  What is atmosphere in current home: Comfortable, Other (Comment)(Overwhelming)   Family History:  Marital status: Divorced Divorced, when?: 2001  What types of issues is patient dealing with in the relationship?:  Patient reports her ex-husband struggled with alcoholism, was very controlling and insecure  Are you sexually active?: No What is your sexual orientation?: Heterosexual  Has your sexual activity been affected by drugs, alcohol, medication, or emotional stress?: No  Does patient have children?: Yes How many children?: 1 How is patient's relationship with their children?: Patient reports having a strained and distant relationship with her daughter.    Childhood History:  By whom was/is the patient raised?: Both parents Description of patient's relationship with caregiver when they were a child: Patient reports having a good relationship with her parents during her childhood. ` Patient's description of current relationship with people who raised him/her: Patient reports she continues to have a good relationship with her mother. She states her father is currently deceased.  How were you disciplined when you got in trouble as a child/adolescent?: Whoopings/Switches  Does patient have siblings?: Yes Number of Siblings: 3 Description of patient's current relationship with siblings: Patient reports having a distant relationship with her two brothers, however she and her sister are very close  Did patient suffer any verbal/emotional/physical/sexual abuse as a child?: No Did patient suffer from severe childhood neglect?: No Has patient ever been sexually abused/assaulted/raped as an adolescent or adult?: No Was the patient ever a victim of a crime or a disaster?: No Witnessed domestic violence?: No Has patient been effected by domestic violence as an adult?: No   Education:  Highest grade of school patient has completed: GED Currently a Ship broker?: No Learning disability?: No   Employment/Work Situation:   Employment situation: On disability Why is patient on disability: PTSD/Anxiety; Mental health  How long has patient been on disability: 2 years Patient's job has been impacted by current  illness: No What is the longest time patient has a held a job?: 25 years  Where was the patient employed at that time?: [employer]  Did You Receive Any Psychiatric Treatment/Services While in the [employer]?: No Are There Guns or Other Weapons in [place]?: No   Financial Resources:   Museum/gallery curator resources: Teacher, early years/pre, Multimedia programmer Does patient have a Programmer, applications or guardian?: No   Alcohol/Substance Abuse:   What has been your use of drugs/alcohol within the last 12 months?: Denies  If attempted suicide, did drugs/alcohol play a role in this?: No Alcohol/Substance Abuse Treatment Hx: Denies past history Has alcohol/substance abuse ever caused legal problems?: No   Social Support System:   Pensions consultant Support System: Good Describe Community Support System: "My sister"  Type of faith/religion: Christianity  How does patient's faith help to cope with current illness?: Prayer    Leisure/Recreation:   Leisure and Hobbies: "Nothing"    Strengths/Needs:   What is the patient's perception of their strengths?: "I dont know right now"  Patient states they can use these personal strengths during their treatment to contribute to their recovery: To be determined  Patient states these barriers may affect/interfere with their treatment: None  Patient states these barriers may affect their return to the community: "My anxiety"  Other important information patient would like considered in planning for their treatment: No    Discharge Plan:   Currently receiving community mental health services: Yes (From Whom)(Dr. Chucky May ) Patient states concerns and preferences for aftercare planning are: Patient expressed interest in continuing to follow up with her current provider or switching to someone who takes her insurance; CSW will continue to assess Patient states they will know when they are safe and ready for discharge when: No, not at this time.  Does patient have  access to transportation?: Yes Does patient have financial barriers related to discharge medications?: No Will patient be returning to same living situation after discharge?: Yes  Summary/Recommendations:   Summary and Recommendations (to be completed by the evaluator): Yannis is a 63 year old female who is diagnosed with MDD (major depressive disorder), recurrent episode, severe. She presented to the hospital seeking treatment for suicidal ideation, worsening anxiety, worsening depressive symptoms, and need for ECT. Ellon states that she came to the hospital because she was "having thoughts of not being here anymore". Yuritzi states that since her son's passing 8 years ago, she has not been able to cope with his death. She reports experiencing increased anxiety and depressive symptoms. She also identified her strained relationship with her daughter as a contributing stressor. Tomicka follows up with Dr. Chucky May for medication management services, but reports she needs a new provider as Dr. [facility] does not take her insurance. Kristalynn can benefit from crisis stabilization, medication management, therapeutic milieu and referral services. CSW assessing for appropriate referrals.  [place] MSW LCSW 03/19/2019 1:59 PM

## 2019-03-19 NOTE — [facility] Group Notes (Signed)

## 2019-03-19 NOTE — Anesthesia Post-op Follow-up Note (Signed)
Anesthesia QCDR form completed.        

## 2019-03-19 NOTE — H&P (Signed)
Jeanette Rose is an 63 y.o. female.   Chief Complaint: Patient continues to report today being profoundly anxious.  Multiple somatic complaints without clear physiologic basis.  Complains of feeling hot tingly itchy etc.  Acknowledges very anxious.  Depressed mood.  Passive suicidal thoughts and hopelessness.  No hallucinations. HPI: Patient has a history of recurrent severe major depression.  She has been seeing her outpatient psychiatrist for years.  Patient has documented failure to respond to multiple medications including Lexapro Wellbutrin and Cymbalta including Cymbalta with recent addition of Abilify.  Patient presented to the hospital with suicidal ideation profound anxiety disorganized thinking subjective feelings of cognitive impairment.  Patient was recommended for ECT treatment because of severe symptoms and failure to respond to multiple medicines.  Patient's initial MA DRS score was 38, high.  Patient is continuing to report anxiety and depression.  First treatment was performed without any complications or side effects.  Proposed treatment is at least 6 right unilateral treatments with ongoing monitoring of response.  Continue medications as needed for now with some adjustments as clinically indicated.  Patient will be discharged once we can ascertain improvement to where she is stable particularly if we can complete ECT treatment as an outpatient.  Past Medical History:  Diagnosis Date  . Acute acalculous cholecystitis s/p lap chole 04/03/2014 04/02/2014  . Allergy   . Anemia   . Anxiety   . Asthma   . Depression   . Fatty liver    noted on CT per [facility] records  . Fibromyalgia   . GERD (gastroesophageal reflux disease)   . Hyperlipidemia associated with type 2 diabetes mellitus ([place])   . Hypertension   . Hypothyroid 2015   Transiently in 2015. Synthroid 38mg was stopped 05/07/2016 w/ tsh nml following.  . Obesity (BMI 30-39.9)   . OSA (obstructive sleep apnea)    non-compliant with  CPAP  . Seborrheic dermatitis    [facility]  . Stroke ([facility]    TIA  . Type 2 diabetes mellitus ([facility]     Past Surgical History:  Procedure Laterality Date  . BREAST REDUCTION SURGERY  06/2008  . CHOLECYSTECTOMY N/A 04/03/2014   Procedure: LAPAROSCOPIC CHOLECYSTECTOMY ;  Surgeon: SAdin Hector MD;  Location: [facility] ORS;  Service: General;  Laterality: N/A;  . REDUCTION MAMMAPLASTY    . TUBAL LIGATION      Family History  Problem Relation Age of Onset  . Atrial fibrillation Mother   . Heart disease Father        CAD  . Diabetes Brother   . Cancer Maternal Grandmother   . Diabetes Paternal Grandmother   . Fibromyalgia Sister   . Colitis Sister    Social History:  reports that she quit smoking about 14 years ago. Her smoking use included cigarettes. She has a 60.00 pack-year smoking history. She has never used smokeless tobacco. She reports that she does not drink alcohol or use drugs.  Allergies:  Allergies  Allergen Reactions  . Farxiga [Dapagliflozin] Anxiety and Other (See Comments)    Made depression and anxiety worse  . Rexulti [Brexpiprazole] Anxiety and Other (See Comments)    Made anxiety worse  . Trintellix [Vortioxetine] Anxiety and Other (See Comments)    Made anxiety worse    Medications Prior to Admission  Medication Sig Dispense Refill  . aspirin (ASPIRIN LOW DOSE) 81 MG tablet Take 81 mg by mouth daily.     .Marland Kitchenatorvastatin (LIPITOR) 80  MG tablet TAKE 1 TABLET (80 MG TOTAL) BY MOUTH DAILY AT 6 PM. 90 tablet 0  . DULoxetine (CYMBALTA) 60 MG capsule Take 1 capsule (60 mg total) by mouth daily. 1 capsule 0  . feeding supplement, ENSURE ENLIVE, (ENSURE ENLIVE) LIQD Take 237 mLs by mouth 2 (two) times daily between meals. 1 mL 0  . hydrOXYzine (ATARAX/VISTARIL) 25 MG tablet Take 1 tablet (25 mg total) by mouth 3 (three) times daily as needed for anxiety. 1 tablet 0  . irbesartan (AVAPRO) 150 MG tablet Take 1 tablet (150 mg  total) by mouth daily. 1 tablet 0  . LORazepam (ATIVAN) 0.5 MG tablet Take 3 tablets (1.5 mg total) by mouth at bedtime. 1 tablet 0  . LORazepam (ATIVAN) 1 MG tablet Take 1 tablet (1 mg total) by mouth every 8 (eight) hours as needed (withdrawal symptoms). 1 tablet 0  . metFORMIN (FORTAMET) 1000 MG (OSM) 24 hr tablet Take 1 tablet (1,000 mg total) by mouth 2 (two) times daily with a meal. 1 tablet 0  . Multiple Vitamin (MULTIVITAMIN WITH MINERALS) TABS tablet Take 1 tablet by mouth daily. 1 tablet 0  . nitrofurantoin, macrocrystal-monohydrate, (MACROBID) 100 MG capsule Take 1 capsule (100 mg total) by mouth every 12 (twelve) hours. 5 capsule 0  . nortriptyline (PAMELOR) 25 MG capsule Take 1 capsule (25 mg total) by mouth at bedtime. 1 capsule 0  . nortriptyline (PAMELOR) 25 MG capsule Take 1 capsule (25 mg total) by mouth daily. 1 capsule 0  . Oxcarbazepine (TRILEPTAL) 300 MG tablet Take 1 tablet (300 mg total) by mouth 2 (two) times daily. 1 tablet 0  . pantoprazole (PROTONIX) 40 MG tablet Take 1 tablet (40 mg total) by mouth every morning. 1 tablet 0  . polyethylene glycol (MIRALAX / GLYCOLAX) 17 g packet Take 17 g by mouth daily as needed for mild constipation or moderate constipation. 1 each 0  . thiamine 100 MG tablet Take 1 tablet (100 mg total) by mouth daily. 1 tablet 0    Results for orders placed or performed during the hospital encounter of 03/15/19 (from the past 48 hour(s))  Glucose, capillary     Status: Abnormal   Collection Time: 03/17/19 11:29 AM  Result Value Ref Range   Glucose-Capillary 199 (H) 70 - 99 mg/dL   Comment 1 Document in Chart   Glucose, capillary     Status: Abnormal   Collection Time: 03/17/19  4:22 PM  Result Value Ref Range   Glucose-Capillary 123 (H) 70 - 99 mg/dL  Glucose, capillary     Status: Abnormal   Collection Time: 03/17/19  9:59 PM  Result Value Ref Range   Glucose-Capillary 144 (H) 70 - 99 mg/dL  Glucose, capillary     Status: Abnormal    Collection Time: 03/18/19  7:09 AM  Result Value Ref Range   Glucose-Capillary 189 (H) 70 - 99 mg/dL   Comment 1 Notify RN   Glucose, capillary     Status: Abnormal   Collection Time: 03/18/19 11:28 AM  Result Value Ref Range   Glucose-Capillary 193 (H) 70 - 99 mg/dL  Glucose, capillary     Status: Abnormal   Collection Time: 03/18/19  4:16 PM  Result Value Ref Range   Glucose-Capillary 120 (H) 70 - 99 mg/dL  Glucose, capillary     Status: Abnormal   Collection Time: 03/18/19  9:07 PM  Result Value Ref Range   Glucose-Capillary 142 (H) 70 - 99 mg/dL  Glucose, capillary  Status: Abnormal   Collection Time: 03/19/19  6:18 AM  Result Value Ref Range   Glucose-Capillary 192 (H) 70 - 99 mg/dL   No results found.  Review of Systems  Constitutional: Negative.   HENT: Negative.   Eyes: Negative.   Respiratory: Negative.   Cardiovascular: Negative.   Gastrointestinal: Negative.   Musculoskeletal: Negative.   Skin: Negative.   Neurological: Positive for tingling.  Psychiatric/Behavioral: Positive for depression and suicidal ideas. Negative for hallucinations and substance abuse. The patient is nervous/anxious and has insomnia.     Blood pressure (!) 152/68, pulse 84, temperature 98.1 F (36.7 C), temperature source Oral, resp. rate 18, height 5' 4"  (1.626 m), weight 75.8 kg, SpO2 97 %. Physical Exam  Nursing note and vitals reviewed. Constitutional: She appears well-developed and well-nourished.  HENT:  Head: Normocephalic and atraumatic.  Eyes: Pupils are equal, round, and reactive to light. Conjunctivae are normal.  Neck: Normal range of motion.  Cardiovascular: Regular rhythm and normal heart sounds.  Respiratory: Effort normal.  GI: Soft.  Musculoskeletal: Normal range of motion.  Neurological: She is alert.  Skin: Skin is warm and dry.  Psychiatric: Her mood appears anxious. Her speech is tangential. She is agitated. She is not aggressive. Cognition and memory are  impaired. She expresses impulsivity and inappropriate judgment. She exhibits a depressed mood. She expresses suicidal ideation. She expresses no suicidal plans.     Assessment/Plan Second ECT treatment today.  No complication.  Continue with index course.  Alethia Berthold, MD 03/19/2019, 10:33 AM

## 2019-03-19 NOTE — Plan of Care (Signed)
D- Patient alert and oriented. Patient presents in an anxious/fidgety mood on assessment stating that she didn't sleep at all last night because "I feel like I'm in a sweat box, like I'm cooking from the inside out". Patient also stated that "I get like this when I have a reaction to medication". Patient denied any signs/symptoms of depression reporting "I can't feel anything", however, she states that "I just feel myself shaking" in regards to anxiety. Patient denies SI, HI, AVH, and pain at this time. Patient stated that she isn't in any pain, it's just that her left leg was cramping and she has a "strange feeling in my back". Patient's goal for today is "ECT".  A- Scheduled medications administered to patient, per MD orders. Support and encouragement provided.  Routine safety checks conducted every 15 minutes.  Patient informed to notify staff with problems or concerns.  R- No adverse drug reactions noted. Patient contracts for safety at this time. Patient compliant with medications and treatment plan. Patient receptive, calm, and cooperative. Patient interacts well with others on the unit.  Patient remains safe at this time.  Problem: Education: Goal: Utilization of techniques to improve thought processes will improve Outcome: Progressing Goal: Knowledge of the prescribed therapeutic regimen will improve Outcome: Progressing   Problem: Activity: Goal: Interest or engagement in leisure activities will improve Outcome: Progressing Goal: Imbalance in normal sleep/wake cycle will improve Outcome: Progressing   Problem: Coping: Goal: Coping ability will improve Outcome: Progressing Goal: Will verbalize feelings Outcome: Progressing   Problem: Health Behavior/Discharge Planning: Goal: Ability to make decisions will improve Outcome: Progressing Goal: Compliance with therapeutic regimen will improve Outcome: Progressing   Problem: Role Relationship: Goal: Will demonstrate positive changes  in social behaviors and relationships Outcome: Progressing   Problem: Safety: Goal: Ability to disclose and discuss suicidal ideas will improve Outcome: Progressing Goal: Ability to identify and utilize support systems that promote safety will improve Outcome: Progressing   Problem: Self-Concept: Goal: Will verbalize positive feelings about self Outcome: Progressing Goal: Level of anxiety will decrease Outcome: Progressing   Problem: Education: Goal: Knowledge of [place] [facility] Education information/materials will improve Outcome: Progressing Goal: Emotional status will improve Outcome: Progressing Goal: Mental status will improve Outcome: Progressing Goal: Verbalization of understanding the information provided will improve Outcome: Progressing   Problem: Self-Concept: Goal: Level of anxiety will decrease Outcome: Progressing

## 2019-03-19 NOTE — Procedures (Signed)
ECT SERVICES Physician's Interval Evaluation & Treatment Note  Patient Identification: Jeanette Rose MRN:  [id number] Date of Evaluation:  03/19/2019 TX #: 2  MADRS:   MMSE:   P.E. Findings:  No change to physical exam  Psychiatric Interval Note:  Patient is back to being extremely anxious worried and overly somatic after a brief respite over the weekend  Subjective:  Patient is a 63 y.o. female seen for evaluation for Electroconvulsive Therapy. All sorts of complaints.  Feels like she is "burning up from the inside" even though her temperature is completely normal.  Anxious.  Still depressed and hopeless.  Treatment Summary:   [x]   Right Unilateral             []  Bilateral   % Energy : 0.3 ms 60%   Impedance: 2990 ohms  Seizure Energy Index: 13,032 V squared  Postictal Suppression Index: 92%  Seizure Concordance Index: 95%  Medications  Pre Shock: Robinul 0.1 mg Zofran 4 mg Brevital 80 mg succinylcholine 100 mg Toradol 30 mg  Post Shock: Versed 2 mg  Seizure Duration: 27 seconds EMG 66 seconds EEG   Comments: No complications to treatment.  Effective seeming seizure.  Plan to continue through the week looking for improvements in depression and anxiety and functionality.  Lungs:  [x]   Clear to auscultation               []  Other:   Heart:    [x]   Regular rhythm             []  irregular rhythm    [x]   Previous H&P reviewed, patient examined and there are NO CHANGES                 []   Previous H&P reviewed, patient examined and there are changes noted.   Alethia Berthold, MD 6/22/20205:26 PM

## 2019-03-19 NOTE — Anesthesia Preprocedure Evaluation (Signed)
Anesthesia Evaluation  Patient identified by MRN, date of birth, ID band Patient awake    Reviewed: Allergy & Precautions, NPO status , Patient's Chart, lab work & pertinent test results, reviewed documented beta blocker date and time   Airway Mallampati: III  TM Distance: >3 FB     Dental  (+) Chipped   Pulmonary asthma , sleep apnea , former smoker,           Cardiovascular hypertension, Pt. on medications      Neuro/Psych PSYCHIATRIC DISORDERS Anxiety Depression  Neuromuscular disease CVA    GI/Hepatic GERD  Controlled,(+) Hepatitis -  Endo/Other  diabetes, Type 2Hypothyroidism   Renal/GU      Musculoskeletal  (+) Fibromyalgia -  Abdominal   Peds  Hematology  (+) anemia ,   Anesthesia Other Findings   Reproductive/Obstetrics                             Anesthesia Physical Anesthesia Plan  ASA: III  Anesthesia Plan: General   Post-op Pain Management:    Induction: Intravenous  PONV Risk Score and Plan:   Airway Management Planned:   Additional Equipment:   Intra-op Plan:   Post-operative Plan:   Informed Consent: I have reviewed the patients History and Physical, chart, labs and discussed the procedure including the risks, benefits and alternatives for the proposed anesthesia with the patient or authorized representative who has indicated his/her understanding and acceptance.       Plan Discussed with: CRNA  Anesthesia Plan Comments:         Anesthesia Quick Evaluation

## 2019-03-19 NOTE — Progress Notes (Signed)
Recreation Therapy Notes   Date: 03/19/2019  Time: 9:30 am   Location: Craft room   Behavioral response: N/A   Intervention Topic: Problem Solving  Discussion/Intervention: Patient did not attend group.   Clinical Observations/Feedback:  Patient did not attend group.   Shayne Diguglielmo LRT/CTRS         Jeanette Rose 03/19/2019 11:04 AM

## 2019-03-19 NOTE — Progress Notes (Signed)
Patient has ECT today, her morning medication will be administered upon her return to the unit.

## 2019-03-19 NOTE — Anesthesia Postprocedure Evaluation (Signed)
Anesthesia Post Note  Patient: Jeanette Rose  Procedure(s) Performed: ECT TX  Patient location during evaluation: PACU Anesthesia Type: General Level of consciousness: awake and alert Pain management: pain level controlled Vital Signs Assessment: post-procedure vital signs reviewed and stable Respiratory status: spontaneous breathing, nonlabored ventilation, respiratory function stable and patient connected to nasal cannula oxygen Cardiovascular status: blood pressure returned to baseline and stable Postop Assessment: no apparent nausea or vomiting Anesthetic complications: no     Last Vitals:  Vitals:   03/19/19 1106 03/19/19 1108  BP:  (!) 152/83  Pulse: (!) 110 (!) 111  Resp: 20 18  Temp:  36.7 C  SpO2: 95% 95%    Last Pain:  Vitals:   03/19/19 1108  TempSrc:   PainSc: 0-No pain                 [name]

## 2019-03-19 NOTE — Anesthesia Procedure Notes (Signed)
Date/Time: 03/19/2019 10:30 AM Performed by: Caryl Asp, CRNA Oxygen Delivery Method: Circle system utilized Preoxygenation: Pre-oxygenation with 100% oxygen Induction Type: IV induction Ventilation: Mask ventilation without difficulty

## 2019-03-19 NOTE — Transfer of Care (Signed)
Immediate Anesthesia Transfer of Care Note  Patient: Jeanette Rose  Procedure(s) Performed: ECT TX  Patient Location: PACU  Anesthesia Type:General  Level of Consciousness: awake and alert   Airway & Oxygen Therapy: Patient Spontanous Breathing and Patient connected to face mask oxygen  Post-op Assessment: Report given to RN, Post -op Vital signs reviewed and stable and Patient moving all extremities X 4  Post vital signs: Reviewed and stable  Last Vitals:  Vitals Value Taken Time  BP    Temp    Pulse 96 03/19/19 1040  Resp 16 03/19/19 1040  SpO2 98 % 03/19/19 1040  Vitals shown include unvalidated device data.  Last Pain:  Vitals:   03/19/19 0938  TempSrc: Oral  PainSc: 0-No pain      Patients Stated Pain Goal: 4 (66/81/59 4707)  Complications: No apparent anesthesia complications

## 2019-03-19 NOTE — Progress Notes (Signed)
[facility] MD Progress Note  03/19/2019 5:29 PM Jeanette Rose  MRN:  [id number] Subjective: Patient seen and chart reviewed.  Patient, who was improved slightly on Saturday, is back to being extremely anxious.  She is overwhelmed with somatic complaints.  Complains of a feeling of "burning up" from the inside although her temperature is normal.  Complains in the afternoon that she is not able to breathe or drink despite the fact that she is doing both of these perfectly fine right in front of me.  Patient complains of "not being able to remember anything" without any specific reference to it.  In other words she is just overwhelmingly anxious.  Feels hopeless.  Passive suicidal thoughts.  Tolerated ECT however without difficulty Principal Problem: Severe recurrent major depression without psychotic features ([place]) Diagnosis: Principal Problem:   Severe recurrent major depression without psychotic features ([place]) Active Problems:   Type 2 diabetes mellitus with diabetic polyneuropathy, with long-term current use of insulin (HCC)   GERD (gastroesophageal reflux disease)   Hypertension   Obesity (BMI 30-39.9)   Constipation   History of hypothyroidism   Major depression  Total Time spent with patient: 30 minutes  Past Psychiatric History: Past history of major depression with current episode significantly worse.  Poor response to multiple trials of antidepressant.  Past Medical History:  Past Medical History:  Diagnosis Date  . Acute acalculous cholecystitis s/p lap chole 04/03/2014 04/02/2014  . Allergy   . Anemia   . Anxiety   . Asthma   . Depression   . Fatty liver    noted on CT per [facility] records  . Fibromyalgia   . GERD (gastroesophageal reflux disease)   . Hyperlipidemia associated with type 2 diabetes mellitus ([place])   . Hypertension   . Hypothyroid 2015   Transiently in 2015. Synthroid 30mg was stopped 05/07/2016 w/ tsh nml following.  . Obesity (BMI 30-39.9)   . OSA (obstructive sleep  apnea)    non-compliant with CPAP  . Seborrheic dermatitis    [facility]  . Stroke ([facility]    TIA  . Type 2 diabetes mellitus ([place]     Past Surgical History:  Procedure Laterality Date  . BREAST REDUCTION SURGERY  06/2008  . CHOLECYSTECTOMY N/A 04/03/2014   Procedure: LAPAROSCOPIC CHOLECYSTECTOMY ;  Surgeon: SAdin Hector MD;  Location: [facility] ORS;  Service: General;  Laterality: N/A;  . REDUCTION MAMMAPLASTY    . TUBAL LIGATION     Family History:  Family History  Problem Relation Age of Onset  . Atrial fibrillation Mother   . Heart disease Father        CAD  . Diabetes Brother   . Cancer Maternal Grandmother   . Diabetes Paternal Grandmother   . Fibromyalgia Sister   . Colitis Sister    Family Psychiatric  History: See previous Social History:  Social History   Substance and Sexual Activity  Alcohol Use No     Social History   Substance and Sexual Activity  Drug Use No    Social History   Socioeconomic History  . Marital status: Divorced    Spouse name: Not on file  . Number of children: Not on file  . Years of education: Not on file  . Highest education level: Not on file  Occupational History  . Not on file  Social Needs  . Financial resource strain: Not on file  . Food insecurity    Worry: Not on  file    Inability: Not on file  . Transportation needs    Medical: Not on file    Non-medical: Not on file  Tobacco Use  . Smoking status: Former Smoker    Packs/day: 4.00    Years: 15.00    Pack years: 60.00    Types: Cigarettes    Quit date: 04/22/2004    Years since quitting: 14.9  . Smokeless tobacco: Never Used  . Tobacco comment: 4 pack year hx  Substance and Sexual Activity  . Alcohol use: No  . Drug use: No  . Sexual activity: Never  Lifestyle  . Physical activity    Days per week: Not on file    Minutes per session: Not on file  . Stress: Not on file  Relationships  . Social Herbalist on  phone: Not on file    Gets together: Not on file    Attends religious service: Not on file    Active member of club or organization: Not on file    Attends meetings of clubs or organizations: Not on file    Relationship status: Not on file  Other Topics Concern  . Not on file  Social History Narrative  . Not on file   Additional Social History:                         Sleep: Fair  Appetite:  Fair  Current Medications: Current Facility-Administered Medications  Medication Dose Route Frequency Provider Last Rate Last Dose  . acetaminophen (TYLENOL) tablet 650 mg  650 mg Oral Q6H PRN Clifford Coudriet T, MD      . alum & mag hydroxide-simeth (MAALOX/MYLANTA) 200-200-20 MG/5ML suspension 30 mL  30 mL Oral Q4H PRN Alby Schwabe T, MD      . aspirin EC tablet 81 mg  81 mg Oral Daily Kayo Zion, Madie Reno, MD   81 mg at 03/19/19 1244  . atorvastatin (LIPITOR) tablet 80 mg  80 mg Oral q1800 Fielding Mault, Madie Reno, MD   80 mg at 03/18/19 1729  . docusate sodium (COLACE) capsule 100 mg  100 mg Oral BID Anmarie Fukushima, Madie Reno, MD   100 mg at 03/19/19 1245  . DULoxetine (CYMBALTA) DR capsule 60 mg  60 mg Oral Daily Delanna Blacketer, Madie Reno, MD   60 mg at 03/19/19 1244  . feeding supplement (ENSURE ENLIVE) (ENSURE ENLIVE) liquid 237 mL  237 mL Oral BID BM Imir Brumbach T, MD   237 mL at 03/18/19 1949  . fentaNYL (SUBLIMAZE) injection 25 mcg  25 mcg Intravenous Q5 min PRN Gunnar Bulla, MD      . glycopyrrolate (ROBINUL) 0.2 MG/ML injection           . hydrocortisone cream 1 %   Topical BID PRN Corrine Tillis, Madie Reno, MD   1 application at 91/63/84 1601  . hydrOXYzine (ATARAX/VISTARIL) tablet 50 mg  50 mg Oral Q6H PRN Rhaya Coale, Madie Reno, MD   50 mg at 03/19/19 0918  . insulin aspart (novoLOG) injection 0-15 Units  0-15 Units Subcutaneous TID WC Chemere Steffler, Madie Reno, MD   5 Units at 03/19/19 1317  . irbesartan (AVAPRO) tablet 150 mg  150 mg Oral Daily Darean Rote, Madie Reno, MD   150 mg at 03/19/19 0846  . ketorolac (TORADOL) 30 MG/ML  injection 30 mg  30 mg Intravenous Once Jesseca Marsch T, MD      . LORazepam (ATIVAN) tablet 1 mg  1 mg  Oral Q8H PRN Jerline Linzy, Madie Reno, MD   1 mg at 03/16/19 1949  . LORazepam (ATIVAN) tablet 1 mg  1 mg Oral BID Cuma Polyakov, Madie Reno, MD   1 mg at 03/19/19 0848  . magnesium hydroxide (MILK OF MAGNESIA) suspension 30 mL  30 mL Oral Daily PRN Bettylee Feig T, MD      . menthol-cetylpyridinium (CEPACOL) lozenge 3 mg  1 lozenge Oral PRN Liston Thum T, MD      . metFORMIN (GLUCOPHAGE-XR) 24 hr tablet 1,000 mg  1,000 mg Oral BID WC Danetra Glock T, MD   1,000 mg at 03/19/19 1244  . midazolam (VERSED) injection 2 mg  2 mg Intravenous Once Fayez Sturgell T, MD      . multivitamin with minerals tablet 1 tablet  1 tablet Oral Daily Shalamar Crays, Madie Reno, MD   1 tablet at 03/19/19 1244  . ondansetron (ZOFRAN) 4 MG/2ML injection           . ondansetron (ZOFRAN) injection 4 mg  4 mg Intravenous Once Artha Chiasson T, MD      . ondansetron [facility]) injection 4 mg  4 mg Intravenous Once PRN Gunnar Bulla, MD      . ondansetron [facility]) injection 4 mg  4 mg Intravenous Once Layaan Mott T, MD      . pantoprazole (PROTONIX) EC tablet 40 mg  40 mg Oral BH-q7a Alonte Wulff, Madie Reno, MD   40 mg at 03/19/19 1246  . thiamine (VITAMIN B-1) tablet 100 mg  100 mg Oral Daily Itzia Cunliffe T, MD   100 mg at 03/19/19 1246    Lab Results:  Results for orders placed or performed during the hospital encounter of 03/15/19 (from the past 48 hour(s))  Glucose, capillary     Status: Abnormal   Collection Time: 03/17/19  9:59 PM  Result Value Ref Range   Glucose-Capillary 144 (H) 70 - 99 mg/dL  Glucose, capillary     Status: Abnormal   Collection Time: 03/18/19  7:09 AM  Result Value Ref Range   Glucose-Capillary 189 (H) 70 - 99 mg/dL   Comment 1 Notify RN   Glucose, capillary     Status: Abnormal   Collection Time: 03/18/19 11:28 AM  Result Value Ref Range   Glucose-Capillary 193 (H) 70 - 99 mg/dL  Glucose, capillary     Status: Abnormal    Collection Time: 03/18/19  4:16 PM  Result Value Ref Range   Glucose-Capillary 120 (H) 70 - 99 mg/dL  Glucose, capillary     Status: Abnormal   Collection Time: 03/18/19  9:07 PM  Result Value Ref Range   Glucose-Capillary 142 (H) 70 - 99 mg/dL  Glucose, capillary     Status: Abnormal   Collection Time: 03/19/19  6:18 AM  Result Value Ref Range   Glucose-Capillary 192 (H) 70 - 99 mg/dL  Glucose, capillary     Status: Abnormal   Collection Time: 03/19/19 12:43 PM  Result Value Ref Range   Glucose-Capillary 202 (H) 70 - 99 mg/dL    Blood Alcohol level:  Lab Results  Component Value Date   ETH <10 16/03/3709    Metabolic Disorder Labs: Lab Results  Component Value Date   HGBA1C 6.3 (H) 03/09/2019   MPG 134.11 03/09/2019   MPG 180 (H) 04/03/2014   No results found for: PROLACTIN Lab Results  Component Value Date   CHOL 174 10/09/2018   TRIG 210 (H) 10/09/2018   HDL 44 10/09/2018   CHOLHDL  4.0 10/09/2018   LDLCALC 88 10/09/2018   LDLCALC 77 06/28/2018    Physical Findings: AIMS:  , ,  ,  ,    CIWA:    COWS:     Musculoskeletal: Strength & Muscle Tone: within normal limits Gait & Station: normal Patient leans: N/A  Psychiatric Specialty Exam: Physical Exam  Nursing note and vitals reviewed. Constitutional: She appears well-developed and well-nourished.  HENT:  Head: Normocephalic and atraumatic.  Eyes: Pupils are equal, round, and reactive to light. Conjunctivae are normal.  Neck: Normal range of motion.  Cardiovascular: Normal heart sounds.  Respiratory: Effort normal.  GI: Soft.  Musculoskeletal: Normal range of motion.  Neurological: She is alert.  Skin: Skin is warm and dry.  Psychiatric: Her mood appears anxious. Her affect is labile. Her speech is rapid and/or pressured and tangential. She is agitated. She is not aggressive. Thought content is paranoid. Cognition and memory are impaired. She expresses impulsivity. She exhibits a depressed mood. She  expresses suicidal ideation. She expresses no suicidal plans.    Review of Systems  Constitutional: Negative.   HENT: Negative.   Eyes: Negative.   Respiratory: Negative.   Cardiovascular: Negative.   Gastrointestinal: Negative.   Musculoskeletal: Negative.   Skin: Negative.   Neurological: Negative.   Psychiatric/Behavioral: Positive for depression, memory loss and suicidal ideas. Negative for hallucinations and substance abuse. The patient is nervous/anxious and has insomnia.     Blood pressure (!) 148/99, pulse 100, temperature 98.2 F (36.8 C), temperature source Oral, resp. rate 18, height 5' 4"  (1.626 m), weight 75.8 kg, SpO2 97 %.Body mass index is 28.67 kg/m.  General Appearance: Casual  Eye Contact:  Good  Speech:  Pressured  Volume:  Increased  Mood:  Anxious and Depressed  Affect:  Congruent and Inappropriate  Thought Process:  Disorganized  Orientation:  Full (Time, Place, and Person)  Thought Content:  Illogical, Rumination and Tangential  Suicidal Thoughts:  Yes.  without intent/plan  Homicidal Thoughts:  No  Memory:  Immediate;   Fair Recent;   Fair Remote;   Fair  Judgement:  Impaired  Insight:  Shallow  Psychomotor Activity:  Decreased  Concentration:  Concentration: Poor  Recall:  Poor  Fund of Knowledge:  Fair  Language:  Fair  Akathisia:  No  Handed:  Right  AIMS (if indicated):     Assets:  Desire for Improvement Housing Social Support  ADL's:  Impaired  Cognition:  Impaired,  Mild  Sleep:  Number of Hours: 7.25     Treatment Plan Summary: Daily contact with patient to assess and evaluate symptoms and progress in treatment, Medication management and Plan Depressed and anxious woman who is catastrophize and everything.  Spent some time in the afternoon trying to calm her down and redirect her from her multiple somatic complaints.  Explained to her that there was no physical intervention such as a new medicine that I could give her that would fix  these complaints since there was no clear physiologic basis to any of them.  Encourage her to participate in individual and group therapy.  I am going to put her Ativan back up to the 1-1/2 mg twice a day she was on before.  Next ECT treatment Wednesday.  Alethia Berthold, MD 03/19/2019, 5:29 PM

## 2019-03-19 NOTE — Plan of Care (Signed)
  Problem: Education: Goal: Utilization of techniques to improve thought processes will improve Outcome: Progressing Goal: Knowledge of the prescribed therapeutic regimen will improve Outcome: Progressing  D: Patient has been less needy this shift. Continues to complain about the temperature in her room. Given four cups of ice to use during the night. She says drinking ice water helps. Denies SI, HI and AVH. A: Continue to monitor for safety. R: Safety maintained.

## 2019-03-19 NOTE — Progress Notes (Signed)
D: Patient has been less needy this shift. Continues to complain about the temperature in her room. Given four cups of ice to use during the night. She says drinking ice water helps. Denies SI, HI and AVH. A: Continue to monitor for safety. R: Safety maintained.

## 2019-03-20 ENCOUNTER — Other Ambulatory Visit: Payer: Self-pay | Admitting: Psychiatry

## 2019-03-20 LAB — GLUCOSE, CAPILLARY
Glucose-Capillary: 158 mg/dL — ABNORMAL HIGH (ref 70–99)
Glucose-Capillary: 206 mg/dL — ABNORMAL HIGH (ref 70–99)
Glucose-Capillary: 197 mg/dL — ABNORMAL HIGH (ref 70–99)
Glucose-Capillary: 174 mg/dL — ABNORMAL HIGH (ref 70–99)

## 2019-03-20 MED ORDER — BUPIVACAINE HCL (PF) 0.5 % IJ SOLN
INTRAMUSCULAR | Status: AC
  Filled 2019-03-20: qty 30

## 2019-03-20 NOTE — Progress Notes (Signed)
Recreation Therapy Notes  Date: 03/20/2019  Time: 9:30 am   Location: Craft room   Behavioral response: N/A   Intervention Topic: Goals  Discussion/Intervention: Patient did not attend group.   Clinical Observations/Feedback:  Patient did not attend group.   Mahari Vankirk LRT/CTRS        Arnet Hofferber 03/20/2019 10:43 AM

## 2019-03-20 NOTE — Plan of Care (Signed)
D- Patient alert and oriented. Patient presents in a depressed an anxious mood on assessment stating that she didn't sleep well again last night because "I stay so hot". Patient reports a "6/10" lower back/left hip discomfort, stating that "I need to see my chiropractor". Patient rated her depression a "5/10" and her anxiety a "10/10" stating that "everything and knowing that I don't have money to pay these bills" and "my hip and back, it's only going to get worse" is why she's feeling this way. Patient denies SI, HI, AVH to this Probation officer. Patient had no stated goals for today stating "I can't think or concentrate".  A- Scheduled medications administered to patient, per MD orders. Support and encouragement provided.  Routine safety checks conducted every 15 minutes.  Patient informed to notify staff with problems or concerns.  R- No adverse drug reactions noted. Patient contracts for safety at this time. Patient compliant with medications and treatment plan. Patient receptive, calm, and cooperative. Patient interacts well with others on the unit.  Patient remains safe at this time.  Problem: Education: Goal: Utilization of techniques to improve thought processes will improve Outcome: Progressing Goal: Knowledge of the prescribed therapeutic regimen will improve Outcome: Progressing   Problem: Activity: Goal: Interest or engagement in leisure activities will improve Outcome: Progressing Goal: Imbalance in normal sleep/wake cycle will improve Outcome: Progressing   Problem: Coping: Goal: Coping ability will improve Outcome: Progressing Goal: Will verbalize feelings Outcome: Progressing   Problem: Health Behavior/Discharge Planning: Goal: Ability to make decisions will improve Outcome: Progressing Goal: Compliance with therapeutic regimen will improve Outcome: Progressing   Problem: Role Relationship: Goal: Will demonstrate positive changes in social behaviors and relationships Outcome:  Progressing   Problem: Safety: Goal: Ability to disclose and discuss suicidal ideas will improve Outcome: Progressing Goal: Ability to identify and utilize support systems that promote safety will improve Outcome: Progressing   Problem: Self-Concept: Goal: Will verbalize positive feelings about self Outcome: Progressing Goal: Level of anxiety will decrease Outcome: Progressing   Problem: Education: Goal: Knowledge of [facility] Education information/materials will improve Outcome: Progressing Goal: Emotional status will improve Outcome: Progressing Goal: Mental status will improve Outcome: Progressing Goal: Verbalization of understanding the information provided will improve Outcome: Progressing   Problem: Self-Concept: Goal: Level of anxiety will decrease Outcome: Progressing

## 2019-03-20 NOTE — [facility] Group Notes (Signed)
  LCSW Group Therapy Note  03/20/2019 12:52 PM   Type of Therapy/Topic:  Group Therapy:  Feelings about Diagnosis  Participation Level:  Minimal   Description of Group:   This group will allow patients to explore their thoughts and feelings about diagnoses they have received. Patients will be guided to explore their level of understanding and acceptance of these diagnoses. Facilitator will encourage patients to process their thoughts and feelings about the reactions of others to their diagnosis and will guide patients in identifying ways to discuss their diagnosis with significant others in their lives. This group will be process-oriented, with patients participating in exploration of their own experiences, giving and receiving support, and processing challenge from other group members.   Therapeutic Goals: 1. Patient will demonstrate understanding of diagnosis as evidenced by identifying two or more symptoms of the disorder 2. Patient will be able to express two feelings regarding the diagnosis 3. Patient will demonstrate their ability to communicate their needs through discussion and/or role play  Summary of Patient Progress: Pt was present in group, but did not engage much in the group discussion. Pt did report that half her support system is supportive with her mental health diagnosis while the other half just does not understand it. Pt reported that her entire support system is supportive of her medical diagnosis and more open to learning about it.    Therapeutic Modalities:   Cognitive Behavioral Therapy Brief Therapy Feelings Identification    Evalina Field, MSW, LCSW Clinical Social Work 03/20/2019 12:52 PM

## 2019-03-20 NOTE — Progress Notes (Signed)
[facility] MD Progress Note  03/20/2019 4:32 PM Jeanette Rose  MRN:  [id number] Subjective: Follow-up for this patient with depression here for ECT.  Patient initially said that she was feeling okay without any specific complaints.  Came to my office a few minutes later continuing to complain of her subjective sensation of being overheated and having "trouble breathing" by which she seems to mean some kind of funny feeling in her esophagus when she swallows.  She described her mood is being okay.  Initially at least did not seem to be nearly as anxious as she was yesterday Principal Problem: Severe recurrent major depression without psychotic features ([place]) Diagnosis: Principal Problem:   Severe recurrent major depression without psychotic features ([place]) Active Problems:   Type 2 diabetes mellitus with diabetic polyneuropathy, with long-term current use of insulin (HCC)   GERD (gastroesophageal reflux disease)   Hypertension   Obesity (BMI 30-39.9)   Constipation   History of hypothyroidism   Major depression  Total Time spent with patient: 30 minutes  Past Psychiatric History: Past history of major depression recurrent history of trauma anxiety poor response to medicine  Past Medical History:  Past Medical History:  Diagnosis Date  . Acute acalculous cholecystitis s/p lap chole 04/03/2014 04/02/2014  . Allergy   . Anemia   . Anxiety   . Asthma   . Depression   . Fatty liver    noted on CT per [facility] records  . Fibromyalgia   . GERD (gastroesophageal reflux disease)   . Hyperlipidemia associated with type 2 diabetes mellitus ([place])   . Hypertension   . Hypothyroid 2015   Transiently in 2015. Synthroid 72mg was stopped 05/07/2016 w/ tsh nml following.  . Obesity (BMI 30-39.9)   . OSA (obstructive sleep apnea)    non-compliant with CPAP  . Seborrheic dermatitis    [facility]  . Stroke ([facility]    TIA  . Type 2 diabetes mellitus ([place]     Past Surgical  History:  Procedure Laterality Date  . BREAST REDUCTION SURGERY  06/2008  . CHOLECYSTECTOMY N/A 04/03/2014   Procedure: LAPAROSCOPIC CHOLECYSTECTOMY ;  Surgeon: SAdin Hector MD;  Location: [facility];  Service: General;  Laterality: N/A;  . REDUCTION MAMMAPLASTY    . TUBAL LIGATION     Family History:  Family History  Problem Relation Age of Onset  . Atrial fibrillation Mother   . Heart disease Father        CAD  . Diabetes Brother   . Cancer Maternal Grandmother   . Diabetes Paternal Grandmother   . Fibromyalgia Sister   . Colitis Sister    Family Psychiatric  History: See previous Social History:  Social History   Substance and Sexual Activity  Alcohol Use No     Social History   Substance and Sexual Activity  Drug Use No    Social History   Socioeconomic History  . Marital status: Divorced    Spouse name: Not on file  . Number of children: Not on file  . Years of education: Not on file  . Highest education level: Not on file  Occupational History  . Not on file  Social Needs  . Financial resource strain: Not on file  . Food insecurity    Worry: Not on file    Inability: Not on file  . Transportation needs    Medical: Not on file    Non-medical: Not on file  Tobacco Use  .  Smoking status: Former Smoker    Packs/day: 4.00    Years: 15.00    Pack years: 60.00    Types: Cigarettes    Quit date: 04/22/2004    Years since quitting: 14.9  . Smokeless tobacco: Never Used  . Tobacco comment: 4 pack year hx  Substance and Sexual Activity  . Alcohol use: No  . Drug use: No  . Sexual activity: Never  Lifestyle  . Physical activity    Days per week: Not on file    Minutes per session: Not on file  . Stress: Not on file  Relationships  . Social Herbalist on phone: Not on file    Gets together: Not on file    Attends religious service: Not on file    Active member of club or organization: Not on file    Attends meetings of clubs or organizations:  Not on file    Relationship status: Not on file  Other Topics Concern  . Not on file  Social History Narrative  . Not on file   Additional Social History:                         Sleep: Fair  Appetite:  Fair  Current Medications: Current Facility-Administered Medications  Medication Dose Route Frequency Provider Last Rate Last Dose  . acetaminophen (TYLENOL) tablet 650 mg  650 mg Oral Q6H PRN Clapacs, John T, MD      . alum & mag hydroxide-simeth (MAALOX/MYLANTA) 200-200-20 MG/5ML suspension 30 mL  30 mL Oral Q4H PRN Clapacs, John T, MD      . aspirin EC tablet 81 mg  81 mg Oral Daily Clapacs, Madie Reno, MD   81 mg at 03/20/19 0825  . atorvastatin (LIPITOR) tablet 80 mg  80 mg Oral q1800 Clapacs, Madie Reno, MD   80 mg at 03/19/19 1839  . docusate sodium (COLACE) capsule 100 mg  100 mg Oral BID Clapacs, Madie Reno, MD   100 mg at 03/20/19 0825  . DULoxetine (CYMBALTA) DR capsule 60 mg  60 mg Oral Daily Clapacs, Madie Reno, MD   60 mg at 03/20/19 0825  . feeding supplement (ENSURE ENLIVE) (ENSURE ENLIVE) liquid 237 mL  237 mL Oral BID BM Clapacs, Madie Reno, MD   237 mL at 03/19/19 1938  . fentaNYL (SUBLIMAZE) injection 25 mcg  25 mcg Intravenous Q5 min PRN Gunnar Bulla, MD      . hydrocortisone cream 1 %   Topical BID PRN Clapacs, Madie Reno, MD   1 application at 76/54/65 1601  . hydrOXYzine (ATARAX/VISTARIL) tablet 50 mg  50 mg Oral Q6H PRN Clapacs, Madie Reno, MD   50 mg at 03/20/19 1129  . insulin aspart (novoLOG) injection 0-15 Units  0-15 Units Subcutaneous TID WC Clapacs, Madie Reno, MD   3 Units at 03/20/19 1207  . irbesartan (AVAPRO) tablet 150 mg  150 mg Oral Daily Clapacs, Madie Reno, MD   150 mg at 03/20/19 0825  . ketorolac (TORADOL) 30 MG/ML injection 30 mg  30 mg Intravenous Once Clapacs, John T, MD      . LORazepam (ATIVAN) tablet 1 mg  1 mg Oral Q8H PRN Clapacs, Madie Reno, MD   1 mg at 03/19/19 1838  . LORazepam (ATIVAN) tablet 1.5 mg  1.5 mg Oral BID Clapacs, Madie Reno, MD   1.5 mg at 03/20/19  0825  . magnesium hydroxide (MILK OF MAGNESIA) suspension 30 mL  30 mL Oral Daily PRN Clapacs, John T, MD      . menthol-cetylpyridinium (CEPACOL) lozenge 3 mg  1 lozenge Oral PRN Clapacs, John T, MD      . metFORMIN (GLUCOPHAGE-XR) 24 hr tablet 1,000 mg  1,000 mg Oral BID WC Clapacs, Madie Reno, MD   1,000 mg at 03/20/19 0825  . midazolam (VERSED) injection 2 mg  2 mg Intravenous Once Clapacs, John T, MD      . multivitamin with minerals tablet 1 tablet  1 tablet Oral Daily Clapacs, Madie Reno, MD   1 tablet at 03/20/19 0825  . ondansetron (ZOFRAN) injection 4 mg  4 mg Intravenous Once Clapacs, John T, MD      . ondansetron [facility]) injection 4 mg  4 mg Intravenous Once PRN Gunnar Bulla, MD      . ondansetron [facility]) injection 4 mg  4 mg Intravenous Once Clapacs, John T, MD      . pantoprazole (PROTONIX) EC tablet 40 mg  40 mg Oral Honor Junes, Madie Reno, MD   40 mg at 03/20/19 9379  . thiamine (VITAMIN B-1) tablet 100 mg  100 mg Oral Daily Clapacs, Madie Reno, MD   100 mg at 03/20/19 0825    Lab Results:  Results for orders placed or performed during the hospital encounter of 03/15/19 (from the past 48 hour(s))  Glucose, capillary     Status: Abnormal   Collection Time: 03/18/19  9:07 PM  Result Value Ref Range   Glucose-Capillary 142 (H) 70 - 99 mg/dL  Glucose, capillary     Status: Abnormal   Collection Time: 03/19/19  6:18 AM  Result Value Ref Range   Glucose-Capillary 192 (H) 70 - 99 mg/dL  Glucose, capillary     Status: Abnormal   Collection Time: 03/19/19 12:43 PM  Result Value Ref Range   Glucose-Capillary 202 (H) 70 - 99 mg/dL  Glucose, capillary     Status: Abnormal   Collection Time: 03/19/19  5:54 PM  Result Value Ref Range   Glucose-Capillary 158 (H) 70 - 99 mg/dL  Glucose, capillary     Status: Abnormal   Collection Time: 03/20/19  7:28 AM  Result Value Ref Range   Glucose-Capillary 206 (H) 70 - 99 mg/dL   Comment 1 Notify RN     Blood Alcohol level:  Lab Results   Component Value Date   ETH <10 02/40/9735    Metabolic Disorder Labs: Lab Results  Component Value Date   HGBA1C 6.3 (H) 03/09/2019   MPG 134.11 03/09/2019   MPG 180 (H) 04/03/2014   No results found for: PROLACTIN Lab Results  Component Value Date   CHOL 174 10/09/2018   TRIG 210 (H) 10/09/2018   HDL 44 10/09/2018   CHOLHDL 4.0 10/09/2018   LDLCALC 88 10/09/2018   LDLCALC 77 06/28/2018    Physical Findings: AIMS:  , ,  ,  ,    CIWA:    COWS:     Musculoskeletal: Strength & Muscle Tone: within normal limits Gait & Station: normal Patient leans: N/A  Psychiatric Specialty Exam: Physical Exam  Nursing note and vitals reviewed. Constitutional: She appears well-developed and well-nourished.  HENT:  Head: Normocephalic and atraumatic.  Eyes: Pupils are equal, round, and reactive to light. Conjunctivae are normal.  Neck: Normal range of motion.  Cardiovascular: Regular rhythm and normal heart sounds.  Respiratory: Effort normal.  GI: Soft.  Musculoskeletal: Normal range of motion.  Neurological: She is alert.  Skin: Skin  is warm and dry.  Psychiatric: Judgment normal. Her mood appears anxious. Her speech is delayed. She is slowed. Cognition and memory are impaired. She expresses no suicidal ideation.    Review of Systems  Constitutional: Negative.   HENT: Negative.   Eyes: Negative.   Respiratory: Negative.   Cardiovascular: Negative.   Gastrointestinal: Negative.   Musculoskeletal: Negative.   Skin: Negative.   Neurological: Negative.   Psychiatric/Behavioral: Negative for depression, hallucinations, substance abuse and suicidal ideas. The patient is nervous/anxious.     Blood pressure (!) 144/81, pulse (!) 103, temperature 98 F (36.7 C), temperature source Oral, resp. rate 16, height 5' 4"  (1.626 m), weight 75.8 kg, SpO2 97 %.Body mass index is 28.67 kg/m.  General Appearance: Casual  Eye Contact:  Fair  Speech:  Clear and Coherent  Volume:  Normal   Mood:  Anxious  Affect:  Congruent  Thought Process:  Coherent  Orientation:  Full (Time, Place, and Person)  Thought Content:  Logical  Suicidal Thoughts:  No  Homicidal Thoughts:  No  Memory:  Immediate;   Fair Recent;   Fair Remote;   Fair  Judgement:  Fair  Insight:  Fair  Psychomotor Activity:  Normal  Concentration:  Concentration: Fair  Recall:  [employer] of Knowledge:  Fair  Language:  Fair  Akathisia:  No  Handed:  Right  AIMS (if indicated):     Assets:  Desire for Improvement Housing  ADL's:  Intact  Cognition:  WNL  Sleep:  Number of Hours: 5.75     Treatment Plan Summary: Daily contact with patient to assess and evaluate symptoms and progress in treatment, Medication management and Plan Patient appears to be showing a little bit of improvement.  Not as anxious not as depressed not as distressed as yesterday.  I told her I would recheck her thyroid studies based on her subjective complaint of being overheated.  No change to psychiatric medicine.  Supportive therapy review of treatment plan patient will receive ECT tomorrow morning.  Alethia Berthold, MD 03/20/2019, 4:32 PM

## 2019-03-20 NOTE — Progress Notes (Signed)
D: Patient has been less somatically focused. Denies SI, HI and AVH. Voices no complaints.  A: Continue to monitor for safety.  R: Safety maintained.

## 2019-03-20 NOTE — Plan of Care (Signed)
  Problem: Activity: Goal: Interest or engagement in leisure activities will improve Outcome: Progressing Goal: Imbalance in normal sleep/wake cycle will improve Outcome: Progressing  D: Patient has been less somatically focused. Denies SI, HI and AVH. Voices no complaints.  A: Continue to monitor for safety.  R: Safety maintained.

## 2019-03-21 ENCOUNTER — Inpatient Hospital Stay: Payer: BLUE CROSS/BLUE SHIELD | Admitting: Anesthesiology

## 2019-03-21 ENCOUNTER — Encounter: Payer: Self-pay | Admitting: Anesthesiology

## 2019-03-21 ENCOUNTER — Ambulatory Visit (HOSPITAL_COMMUNITY): Payer: BLUE CROSS/BLUE SHIELD

## 2019-03-21 LAB — GLUCOSE, CAPILLARY
Glucose-Capillary: 122 mg/dL — ABNORMAL HIGH (ref 70–99)
Glucose-Capillary: 182 mg/dL — ABNORMAL HIGH (ref 70–99)
Glucose-Capillary: 177 mg/dL — ABNORMAL HIGH (ref 70–99)

## 2019-03-21 MED ORDER — ONDANSETRON HCL 4 MG/2ML IJ SOLN
4.0000 mg | Freq: Once | INTRAMUSCULAR | Status: AC
  Administered 2019-03-21: 10:00:00 4 mg via INTRAVENOUS

## 2019-03-21 MED ORDER — SUCCINYLCHOLINE CHLORIDE 20 MG/ML IJ SOLN
INTRAMUSCULAR | Status: DC | PRN
  Administered 2019-03-21: 100 mg via INTRAVENOUS

## 2019-03-21 MED ORDER — ONDANSETRON HCL 4 MG/2ML IJ SOLN: 4.0000 mg | Freq: Once | INTRAMUSCULAR | Status: DC | PRN

## 2019-03-21 MED ORDER — GLYCOPYRROLATE 0.2 MG/ML IJ SOLN
0.1000 mg | Freq: Once | INTRAMUSCULAR | Status: AC
  Administered 2019-03-21: 0.1 mg via INTRAVENOUS

## 2019-03-21 MED ORDER — SUCCINYLCHOLINE CHLORIDE 20 MG/ML IJ SOLN
INTRAMUSCULAR | Status: AC
  Filled 2019-03-21: qty 1

## 2019-03-21 MED ORDER — SODIUM CHLORIDE 0.9 % IV SOLN
500.0000 mL | Freq: Once | INTRAVENOUS | Status: AC
  Administered 2019-03-21: 500 mL via INTRAVENOUS

## 2019-03-21 MED ORDER — METHOHEXITAL SODIUM 100 MG/10ML IV SOSY
PREFILLED_SYRINGE | INTRAVENOUS | Status: DC | PRN
  Administered 2019-03-21: 80 mg via INTRAVENOUS

## 2019-03-21 MED ORDER — FENTANYL CITRATE (PF) 100 MCG/2ML IJ SOLN: 25.0000 ug | INTRAMUSCULAR | Status: DC | PRN

## 2019-03-21 MED ORDER — MIDAZOLAM HCL 2 MG/2ML IJ SOLN: 2.0000 mg | Freq: Once | INTRAMUSCULAR | Status: DC

## 2019-03-21 MED ORDER — ONDANSETRON HCL 4 MG/2ML IJ SOLN
INTRAMUSCULAR | Status: AC
  Filled 2019-03-21: qty 2

## 2019-03-21 MED ORDER — GLYCOPYRROLATE 0.2 MG/ML IJ SOLN
INTRAMUSCULAR | Status: AC
  Filled 2019-03-21: qty 1

## 2019-03-21 MED ORDER — METHOHEXITAL SODIUM 0.5 G IJ SOLR
INTRAMUSCULAR | Status: AC
  Filled 2019-03-21: qty 500

## 2019-03-21 MED ORDER — MIDAZOLAM HCL 2 MG/2ML IJ SOLN
INTRAMUSCULAR | Status: DC | PRN
  Administered 2019-03-21: 2 mg via INTRAVENOUS

## 2019-03-21 MED ORDER — SODIUM CHLORIDE 0.9 % IV SOLN
INTRAVENOUS | Status: DC | PRN
  Administered 2019-03-21: 10:00:00 via INTRAVENOUS

## 2019-03-21 MED ORDER — GLYCOPYRROLATE 0.2 MG/ML IJ SOLN
INTRAMUSCULAR | Status: AC
  Administered 2019-03-21: 0.1 mg via INTRAVENOUS
  Filled 2019-03-21: qty 1

## 2019-03-21 MED ORDER — MIDAZOLAM HCL 2 MG/2ML IJ SOLN
INTRAMUSCULAR | Status: AC
  Filled 2019-03-21: qty 2

## 2019-03-21 NOTE — Anesthesia Post-op Follow-up Note (Signed)
Anesthesia QCDR form completed.        

## 2019-03-21 NOTE — Plan of Care (Signed)
Patient is oriented X 3 denies SI, HI and AVH. Patient has mild confusion, complains of anxiety. Patient has somatic delusions complains of throat closing, able to eat and talk, take medications without any complications stating,"It started the day before yesterday." Patient can not remember LBM. Patient denies pain 0/10. Safety checks to continue Q 15 minutes. Problem: Education: Goal: Utilization of techniques to improve thought processes will improve Outcome: Not Progressing Goal: Knowledge of the prescribed therapeutic regimen will improve Outcome: Not Progressing   Problem: Activity: Goal: Interest or engagement in leisure activities will improve Outcome: Not Progressing Goal: Imbalance in normal sleep/wake cycle will improve Outcome: Not Progressing   Problem: Coping: Goal: Coping ability will improve Outcome: Not Progressing Goal: Will verbalize feelings Outcome: Not Progressing   Problem: Health Behavior/Discharge Planning: Goal: Ability to make decisions will improve Outcome: Not Progressing Goal: Compliance with therapeutic regimen will improve Outcome: Not Progressing

## 2019-03-21 NOTE — Anesthesia Preprocedure Evaluation (Signed)
Anesthesia Evaluation  Patient identified by MRN, date of birth, ID band Patient awake    Reviewed: Allergy & Precautions, NPO status , Patient's Chart, lab work & pertinent test results, reviewed documented beta blocker date and time   Airway Mallampati: III  TM Distance: >3 FB     Dental  (+) Chipped   Pulmonary asthma , sleep apnea , former smoker,           Cardiovascular hypertension, Pt. on medications      Neuro/Psych PSYCHIATRIC DISORDERS Anxiety Depression  Neuromuscular disease    GI/Hepatic GERD  Controlled,(+) Hepatitis -  Endo/Other  diabetes, Type 2Hypothyroidism   Renal/GU      Musculoskeletal  (+) Fibromyalgia -  Abdominal   Peds  Hematology  (+) anemia ,   Anesthesia Other Findings   Reproductive/Obstetrics                             Anesthesia Physical Anesthesia Plan  ASA: III  Anesthesia Plan: General   Post-op Pain Management:    Induction: Intravenous  PONV Risk Score and Plan:   Airway Management Planned:   Additional Equipment:   Intra-op Plan:   Post-operative Plan:   Informed Consent: I have reviewed the patients History and Physical, chart, labs and discussed the procedure including the risks, benefits and alternatives for the proposed anesthesia with the patient or authorized representative who has indicated his/her understanding and acceptance.       Plan Discussed with: CRNA  Anesthesia Plan Comments:         Anesthesia Quick Evaluation

## 2019-03-21 NOTE — H&P (Signed)
Jeanette Rose is an 63 y.o. female.   Chief Complaint: Overall feeling better.  Still depressed and anxious not as hyper somatic as previously.  No specific problems related to the ECT. HPI: Recurrent severe depression unresponsive to medication  Past Medical History:  Diagnosis Date  . Acute acalculous cholecystitis s/p lap chole 04/03/2014 04/02/2014  . Allergy   . Anemia   . Anxiety   . Asthma   . Depression   . Fatty liver    noted on CT per [facility] records  . Fibromyalgia   . GERD (gastroesophageal reflux disease)   . Hyperlipidemia associated with type 2 diabetes mellitus ([place])   . Hypertension   . Hypothyroid 2015   Transiently in 2015. Synthroid 39mg was stopped 05/07/2016 w/ tsh nml following.  . Obesity (BMI 30-39.9)   . OSA (obstructive sleep apnea)    non-compliant with CPAP  . Seborrheic dermatitis    [facility]  . [facility] ([facility]    TIA  . Type 2 diabetes mellitus ([place]     Past Surgical History:  Procedure Laterality Date  . BREAST REDUCTION SURGERY  06/2008  . CHOLECYSTECTOMY N/A 04/03/2014   Procedure: LAPAROSCOPIC CHOLECYSTECTOMY ;  Surgeon: SAdin Hector MD;  Location: [facility];  Service: General;  Laterality: N/A;  . REDUCTION MAMMAPLASTY    . TUBAL LIGATION      Family History  Problem Relation Age of Onset  . Atrial fibrillation Mother   . Heart disease Father        CAD  . Diabetes Brother   . Cancer Maternal Grandmother   . Diabetes Paternal Grandmother   . Fibromyalgia Sister   . Colitis Sister    Social History:  reports that she quit smoking about 14 years ago. Her smoking use included cigarettes. She has a 60.00 pack-year smoking history. She has never used smokeless tobacco. She reports that she does not drink alcohol or use drugs.  Allergies:  Allergies  Allergen Reactions  . Farxiga [Dapagliflozin] Anxiety and Other (See Comments)    Made depression and anxiety worse  . Rexulti [Brexpiprazole] Anxiety  and Other (See Comments)    Made anxiety worse  . Trintellix [Vortioxetine] Anxiety and Other (See Comments)    Made anxiety worse    Medications Prior to Admission  Medication Sig Dispense Refill  . aspirin (ASPIRIN LOW DOSE) 81 MG tablet Take 81 mg by mouth daily.     .Marland Kitchenatorvastatin (LIPITOR) 80 MG tablet TAKE 1 TABLET (80 MG TOTAL) BY MOUTH DAILY AT 6 PM. 90 tablet 0  . DULoxetine (CYMBALTA) 60 MG capsule Take 1 capsule (60 mg total) by mouth daily. 1 capsule 0  . feeding supplement, ENSURE ENLIVE, (ENSURE ENLIVE) LIQD Take 237 mLs by mouth 2 (two) times daily between meals. 1 mL 0  . hydrOXYzine (ATARAX/VISTARIL) 25 MG tablet Take 1 tablet (25 mg total) by mouth 3 (three) times daily as needed for anxiety. 1 tablet 0  . irbesartan (AVAPRO) 150 MG tablet Take 1 tablet (150 mg total) by mouth daily. 1 tablet 0  . LORazepam (ATIVAN) 0.5 MG tablet Take 3 tablets (1.5 mg total) by mouth at bedtime. 1 tablet 0  . LORazepam (ATIVAN) 1 MG tablet Take 1 tablet (1 mg total) by mouth every 8 (eight) hours as needed (withdrawal symptoms). 1 tablet 0  . metFORMIN (FORTAMET) 1000 MG (OSM) 24 hr tablet Take 1 tablet (1,000 mg total) by mouth 2 (two) times  daily with a meal. 1 tablet 0  . Multiple Vitamin (MULTIVITAMIN WITH MINERALS) TABS tablet Take 1 tablet by mouth daily. 1 tablet 0  . nitrofurantoin, macrocrystal-monohydrate, (MACROBID) 100 MG capsule Take 1 capsule (100 mg total) by mouth every 12 (twelve) hours. 5 capsule 0  . nortriptyline (PAMELOR) 25 MG capsule Take 1 capsule (25 mg total) by mouth at bedtime. 1 capsule 0  . nortriptyline (PAMELOR) 25 MG capsule Take 1 capsule (25 mg total) by mouth daily. 1 capsule 0  . Oxcarbazepine (TRILEPTAL) 300 MG tablet Take 1 tablet (300 mg total) by mouth 2 (two) times daily. 1 tablet 0  . pantoprazole (PROTONIX) 40 MG tablet Take 1 tablet (40 mg total) by mouth every morning. 1 tablet 0  . polyethylene glycol (MIRALAX / GLYCOLAX) 17 g packet Take 17 g  by mouth daily as needed for mild constipation or moderate constipation. 1 each 0  . thiamine 100 MG tablet Take 1 tablet (100 mg total) by mouth daily. 1 tablet 0    Results for orders placed or performed during the hospital encounter of 03/15/19 (from the past 48 hour(s))  Glucose, capillary     Status: Abnormal   Collection Time: 03/19/19 12:43 PM  Result Value Ref Range   Glucose-Capillary 202 (H) 70 - 99 mg/dL  Glucose, capillary     Status: Abnormal   Collection Time: 03/19/19  5:54 PM  Result Value Ref Range   Glucose-Capillary 158 (H) 70 - 99 mg/dL  Glucose, capillary     Status: Abnormal   Collection Time: 03/20/19  7:28 AM  Result Value Ref Range   Glucose-Capillary 206 (H) 70 - 99 mg/dL   Comment 1 Notify RN   Glucose, capillary     Status: Abnormal   Collection Time: 03/20/19 11:26 AM  Result Value Ref Range   Glucose-Capillary 197 (H) 70 - 99 mg/dL  Glucose, capillary     Status: Abnormal   Collection Time: 03/20/19  4:21 PM  Result Value Ref Range   Glucose-Capillary 174 (H) 70 - 99 mg/dL  Glucose, capillary     Status: Abnormal   Collection Time: 03/20/19  9:30 PM  Result Value Ref Range   Glucose-Capillary 122 (H) 70 - 99 mg/dL   Comment 1 Notify RN   Glucose, capillary     Status: Abnormal   Collection Time: 03/21/19  6:30 AM  Result Value Ref Range   Glucose-Capillary 182 (H) 70 - 99 mg/dL   Comment 1 Notify RN    No results found.  Review of Systems  Constitutional: Negative.   HENT: Negative.   Eyes: Negative.   Respiratory: Negative.   Cardiovascular: Negative.   Gastrointestinal: Negative.   Musculoskeletal: Negative.   Skin: Negative.   Neurological: Negative.   Psychiatric/Behavioral: Positive for depression. The patient is nervous/anxious.     Blood pressure (!) 142/78, pulse 100, temperature 98.8 F (37.1 C), temperature source Oral, resp. rate 16, height 5' 4"  (1.626 m), weight 72.6 kg, SpO2 99 %. Physical Exam  Nursing note and vitals  reviewed. Constitutional: She appears well-developed and well-nourished.  HENT:  Head: Normocephalic and atraumatic.  Eyes: Pupils are equal, round, and reactive to light. Conjunctivae are normal.  Neck: Normal range of motion.  Cardiovascular: Normal heart sounds.  Respiratory: Effort normal.  GI: Soft.  Musculoskeletal: Normal range of motion.  Neurological: She is alert.  Skin: Skin is warm and dry.  Psychiatric: Her affect is blunt. Her speech is delayed. She exhibits  a depressed mood.     Assessment/Plan Patient is currently on treatment #3 and the plan is to continue index course ECT treatment for the time being while in the hospital.  Alethia Berthold, MD 03/21/2019, 10:24 AM

## 2019-03-21 NOTE — Transfer of Care (Signed)
Immediate Anesthesia Transfer of Care Note  Patient: Jeanette Rose  Procedure(s) Performed: ECT TX  Patient Location: PACU  Anesthesia Type:General  Level of Consciousness: awake, alert  and oriented  Airway & Oxygen Therapy: Patient Spontanous Breathing and Patient connected to face mask oxygen  Post-op Assessment: Report given to RN, Post -op Vital signs reviewed and stable and Patient moving all extremities X 4  Post vital signs: Reviewed and stable  Last Vitals:  Vitals Value Taken Time  BP 140/122 03/21/19 1045  Temp    Pulse 109 03/21/19 1047  Resp 15 03/21/19 1047  SpO2 99 % 03/21/19 1047  Vitals shown include unvalidated device data.  Last Pain:  Vitals:   03/21/19 0844  TempSrc:   PainSc: 0-No pain      Patients Stated Pain Goal: 4 (26/33/35 4562)  Complications: No apparent anesthesia complications

## 2019-03-21 NOTE — Procedures (Signed)
ECT SERVICES Physician's Interval Evaluation & Treatment Note  Patient Identification: Jeanette Rose MRN:  [id number] Date of Evaluation:  03/21/2019 TX #: 3  MADRS:   MMSE:   P.E. Findings:  No change physical exam  Psychiatric Interval Note:  At least stable and probably a little better with less overwhelming anxiety.  Subjective:  Patient is a 63 y.o. female seen for evaluation for Electroconvulsive Therapy. No specific complaint  Treatment Summary:   [x]   Right Unilateral             []  Bilateral   % Energy : 0.3 ms 60%   Impedance: 2550 ohms  Seizure Energy Index: 2318 V squared  Postictal Suppression Index: Less than 10%  Seizure Concordance Index: 91%  Medications  Pre Shock: Robinul 0.1 mg Zofran 4 mg Brevital 80 mg succinylcholine 100 mg Toradol 30 mg  Post Shock: Versed 2 mg  Seizure Duration: 31 seconds EMG 72 seconds EEG   Comments: Next treatment Friday no change to protocol  Lungs:  [x]   Clear to auscultation               []  Other:   Heart:    [x]   Regular rhythm             []  irregular rhythm    [x]   Previous H&P reviewed, patient examined and there are NO CHANGES                 []   Previous H&P reviewed, patient examined and there are changes noted.   Alethia Berthold, MD 6/24/202010:26 AM

## 2019-03-21 NOTE — Progress Notes (Signed)
Recreation Therapy Notes  Date: 03/21/2019  Time: 9:30 am   Location: Craft room   Behavioral response: N/A   Intervention Topic: Time Management  Discussion/Intervention: Patient did not attend group.   Clinical Observations/Feedback:  Patient did not attend group.   Ninah Moccio LRT/CTRS        Danetta Prom 03/21/2019 11:05 AM

## 2019-03-21 NOTE — Tx Team (Signed)
Interdisciplinary Treatment and Diagnostic Plan Update  03/21/2019 Time of Session: 830am Jeanette Rose MRN: [id number]  Principal Diagnosis: Severe recurrent major depression without psychotic features [facility])  Secondary Diagnoses: Principal Problem:   Severe recurrent major depression without psychotic features ([place]) Active Problems:   Type 2 diabetes mellitus with diabetic polyneuropathy, with long-term current use of insulin (HCC)   GERD (gastroesophageal reflux disease)   Hypertension   Obesity (BMI 30-39.9)   Constipation   History of hypothyroidism   Major depression   Current Medications:  Current Facility-Administered Medications  Medication Dose Route Frequency Provider Last Rate Last Dose  . 0.9 %  sodium chloride infusion  500 mL Intravenous Once Clapacs, John T, MD      . acetaminophen (TYLENOL) tablet 650 mg  650 mg Oral Q6H PRN Clapacs, John T, MD      . alum & mag hydroxide-simeth (MAALOX/MYLANTA) 200-200-20 MG/5ML suspension 30 mL  30 mL Oral Q4H PRN Clapacs, John T, MD      . aspirin EC tablet 81 mg  81 mg Oral Daily Clapacs, Madie Reno, MD   81 mg at 03/20/19 0825  . atorvastatin (LIPITOR) tablet 80 mg  80 mg Oral q1800 Clapacs, Madie Reno, MD   80 mg at 03/20/19 1801  . docusate sodium (COLACE) capsule 100 mg  100 mg Oral BID Clapacs, Madie Reno, MD   100 mg at 03/20/19 1642  . DULoxetine (CYMBALTA) DR capsule 60 mg  60 mg Oral Daily Clapacs, Madie Reno, MD   60 mg at 03/20/19 0825  . feeding supplement (ENSURE ENLIVE) (ENSURE ENLIVE) liquid 237 mL  237 mL Oral BID BM Clapacs, Madie Reno, MD   237 mL at 03/19/19 1938  . fentaNYL (SUBLIMAZE) injection 25 mcg  25 mcg Intravenous Q5 min PRN Gunnar Bulla, MD      . glycopyrrolate (ROBINUL) injection 0.1 mg  0.1 mg Intravenous Once Clapacs, John T, MD      . hydrocortisone cream 1 %   Topical BID PRN Clapacs, Madie Reno, MD   1 application at 67/61/95 1601  . hydrOXYzine (ATARAX/VISTARIL) tablet 50 mg  50 mg Oral Q6H PRN Clapacs, Madie Reno, MD    50 mg at 03/20/19 1129  . insulin aspart (novoLOG) injection 0-15 Units  0-15 Units Subcutaneous TID WC Clapacs, Madie Reno, MD   3 Units at 03/20/19 1642  . irbesartan (AVAPRO) tablet 150 mg  150 mg Oral Daily Clapacs, Madie Reno, MD   150 mg at 03/20/19 0825  . ketorolac (TORADOL) 30 MG/ML injection 30 mg  30 mg Intravenous Once Clapacs, John T, MD      . LORazepam (ATIVAN) tablet 1 mg  1 mg Oral Q8H PRN Clapacs, Madie Reno, MD   1 mg at 03/19/19 1838  . LORazepam (ATIVAN) tablet 1.5 mg  1.5 mg Oral BID Clapacs, Madie Reno, MD   1.5 mg at 03/20/19 1642  . magnesium hydroxide (MILK OF MAGNESIA) suspension 30 mL  30 mL Oral Daily PRN Clapacs, John T, MD      . menthol-cetylpyridinium (CEPACOL) lozenge 3 mg  1 lozenge Oral PRN Clapacs, John T, MD      . metFORMIN (GLUCOPHAGE-XR) 24 hr tablet 1,000 mg  1,000 mg Oral BID WC Clapacs, John T, MD   1,000 mg at 03/20/19 1642  . midazolam (VERSED) injection 2 mg  2 mg Intravenous Once Clapacs, John T, MD      . midazolam (VERSED) injection 2 mg  2 mg Intravenous  Once Clapacs, Madie Reno, MD      . multivitamin with minerals tablet 1 tablet  1 tablet Oral Daily Clapacs, Madie Reno, MD   1 tablet at 03/20/19 0825  . ondansetron (ZOFRAN) injection 4 mg  4 mg Intravenous Once Clapacs, John T, MD      . ondansetron [facility]) injection 4 mg  4 mg Intravenous Once PRN Gunnar Bulla, MD      . ondansetron [facility]) injection 4 mg  4 mg Intravenous Once Clapacs, John T, MD      . ondansetron [facility]) injection 4 mg  4 mg Intravenous Once Clapacs, John T, MD      . pantoprazole (PROTONIX) EC tablet 40 mg  40 mg Oral BH-q7a Clapacs, Madie Reno, MD   40 mg at 03/20/19 6948  . thiamine (VITAMIN B-1) tablet 100 mg  100 mg Oral Daily Clapacs, Madie Reno, MD   100 mg at 03/20/19 0825   PTA Medications: Medications Prior to Admission  Medication Sig Dispense Refill Last Dose  . aspirin (ASPIRIN LOW DOSE) 81 MG tablet Take 81 mg by mouth daily.    03/15/2019  . atorvastatin (LIPITOR) 80 MG tablet TAKE  1 TABLET (80 MG TOTAL) BY MOUTH DAILY AT 6 PM. 90 tablet 0 03/15/2019  . DULoxetine (CYMBALTA) 60 MG capsule Take 1 capsule (60 mg total) by mouth daily. 1 capsule 0 03/15/2019  . feeding supplement, ENSURE ENLIVE, (ENSURE ENLIVE) LIQD Take 237 mLs by mouth 2 (two) times daily between meals. 1 mL 0 03/15/2019  . hydrOXYzine (ATARAX/VISTARIL) 25 MG tablet Take 1 tablet (25 mg total) by mouth 3 (three) times daily as needed for anxiety. 1 tablet 0 03/19/2019 at 0918  . irbesartan (AVAPRO) 150 MG tablet Take 1 tablet (150 mg total) by mouth daily. 1 tablet 0 03/19/2019 at [place]  . LORazepam (ATIVAN) 0.5 MG tablet Take 3 tablets (1.5 mg total) by mouth at bedtime. 1 tablet 0 03/18/2019 at Unknown time  . LORazepam (ATIVAN) 1 MG tablet Take 1 tablet (1 mg total) by mouth every 8 (eight) hours as needed (withdrawal symptoms). 1 tablet 0 03/19/2019 at [place]  . metFORMIN (FORTAMET) 1000 MG (OSM) 24 hr tablet Take 1 tablet (1,000 mg total) by mouth 2 (two) times daily with a meal. 1 tablet 0 03/15/2019  . Multiple Vitamin (MULTIVITAMIN WITH MINERALS) TABS tablet Take 1 tablet by mouth daily. 1 tablet 0 03/15/2019  . nitrofurantoin, macrocrystal-monohydrate, (MACROBID) 100 MG capsule Take 1 capsule (100 mg total) by mouth every 12 (twelve) hours. 5 capsule 0 03/15/2019  . nortriptyline (PAMELOR) 25 MG capsule Take 1 capsule (25 mg total) by mouth at bedtime. 1 capsule 0 03/15/2019  . nortriptyline (PAMELOR) 25 MG capsule Take 1 capsule (25 mg total) by mouth daily. 1 capsule 0 03/15/2019  . Oxcarbazepine (TRILEPTAL) 300 MG tablet Take 1 tablet (300 mg total) by mouth 2 (two) times daily. 1 tablet 0 03/15/2019  . pantoprazole (PROTONIX) 40 MG tablet Take 1 tablet (40 mg total) by mouth every morning. 1 tablet 0 03/15/2019  . polyethylene glycol (MIRALAX / GLYCOLAX) 17 g packet Take 17 g by mouth daily as needed for mild constipation or moderate constipation. 1 each 0 03/15/2019  . thiamine 100 MG tablet Take 1 tablet (100 mg  total) by mouth daily. 1 tablet 0 03/15/2019    Patient Stressors: Health problems Medication change or noncompliance  Patient Strengths: Ability for insight Average or above average intelligence Capable of independent living Communication skills  Supportive family/friends  Treatment Modalities: Medication Management, Group therapy, Case management,  1 to 1 session with clinician, Psychoeducation, Recreational therapy.   Physician Treatment Plan for Primary Diagnosis: Severe recurrent major depression without psychotic features ([place]) Long Term Goal(s): Improvement in symptoms so as ready for discharge Improvement in symptoms so as ready for discharge   Short Term Goals: Ability to verbalize feelings will improve Ability to disclose and discuss suicidal ideas Ability to demonstrate self-control will improve Ability to maintain clinical measurements within normal limits will improve Compliance with prescribed medications will improve  Medication Management: Evaluate patient's response, side effects, and tolerance of medication regimen.  Therapeutic Interventions: 1 to 1 sessions, Unit Group sessions and Medication administration.  Evaluation of Outcomes: Not Met  Physician Treatment Plan for Secondary Diagnosis: Principal Problem:   Severe recurrent major depression without psychotic features ([place]) Active Problems:   Type 2 diabetes mellitus with diabetic polyneuropathy, with long-term current use of insulin (HCC)   GERD (gastroesophageal reflux disease)   Hypertension   Obesity (BMI 30-39.9)   Constipation   History of hypothyroidism   Major depression  Long Term Goal(s): Improvement in symptoms so as ready for discharge Improvement in symptoms so as ready for discharge   Short Term Goals: Ability to verbalize feelings will improve Ability to disclose and discuss suicidal ideas Ability to demonstrate self-control will improve Ability to maintain clinical measurements  within normal limits will improve Compliance with prescribed medications will improve     Medication Management: Evaluate patient's response, side effects, and tolerance of medication regimen.  Therapeutic Interventions: 1 to 1 sessions, Unit Group sessions and Medication administration.  Evaluation of Outcomes: Not Met   RN Treatment Plan for Primary Diagnosis: Severe recurrent major depression without psychotic features ([place]) Long Term Goal(s): Knowledge of disease and therapeutic regimen to maintain health will improve  Short Term Goals: Ability to verbalize feelings will improve, Ability to disclose and discuss suicidal ideas, Ability to identify and develop effective coping behaviors will improve and Compliance with prescribed medications will improve  Medication Management: RN will administer medications as ordered by provider, will assess and evaluate patient's response and provide education to patient for prescribed medication. RN will report any adverse and/or side effects to prescribing provider.  Therapeutic Interventions: 1 on 1 counseling sessions, Psychoeducation, Medication administration, Evaluate responses to treatment, Monitor vital signs and CBGs as ordered, Perform/monitor CIWA, COWS, AIMS and Fall Risk screenings as ordered, Perform wound care treatments as ordered.  Evaluation of Outcomes: Not Met   LCSW Treatment Plan for Primary Diagnosis: Severe recurrent major depression without psychotic features ([place]) Long Term Goal(s): Safe transition to appropriate next level of care at discharge, Engage patient in therapeutic group addressing interpersonal concerns.  Short Term Goals: Engage patient in aftercare planning with referrals and resources  Therapeutic Interventions: Assess for all discharge needs, 1 to 1 time with Social worker, Explore available resources and support systems, Assess for adequacy in community support network, Educate family and significant other(s)  on suicide prevention, Complete Psychosocial Assessment, Interpersonal group therapy.  Evaluation of Outcomes: Not Met   Progress in Treatment: Attending groups: No. Participating in groups: No. Taking medication as prescribed: Yes. Toleration medication: Yes. Family/Significant other contact made: Yes, individual(s) contacted:  [name], sister [phone] Patient understands diagnosis: Yes. Discussing patient identified problems/goals with staff: Yes. Medical problems stabilized or resolved: No. Denies suicidal/homicidal ideation: No. Issues/concerns per patient self-inventory: No. Other: NA  New problem(s) identified: No, Describe:  none reported  New Short Term/Long Term Goal(s): Attend outpatient treatment, take medication as prescribed, develop and implement healthy coping methods to manage stress.  Patient Goals:  "To get ECT and get well"  Discharge Plan or Barriers: Pt will return home and receive outpatient treatment. 03/21/19-Pt is receiving ECT, reports somatic complaints. D/C plan TBD.  Reason for Continuation of Hospitalization: Depression Medication stabilization  Estimated Length of Stay:5-7 days  Recreational Therapy: Patient Stressors: N/A  Patient Goal: Patient will engage in groups without prompting or encouragement from LRT x3 group sessions within 5 recreation therapy group sessions    Attendees: Patient: 03/21/2019 9:15 AM  Physician: Alethia Berthold 03/21/2019 9:15 AM  Nursing: Jerry Caras 03/21/2019 9:15 AM  RN Care Manager: 03/21/2019 9:15 AM  Social Worker: Sanjuana Kava  [place] 03/21/2019 9:15 AM  Recreational Therapist:  03/21/2019 9:15 AM  Other:  03/21/2019 9:15 AM  Other:  03/21/2019 9:15 AM  Other: 03/21/2019 9:15 AM    Scribe for Treatment Team: Yvette Rack, LCSW 03/21/2019 9:15 AM

## 2019-03-21 NOTE — [facility] Group Notes (Signed)
Emotional Regulation 03/21/2019 1PM  Type of Therapy/Topic:  Group Therapy:  Emotion Regulation  Participation Level:  Did Not Attend   Description of Group:   The purpose of this group is to assist patients in learning to regulate negative emotions and experience positive emotions. Patients will be guided to discuss ways in which they have been vulnerable to their negative emotions. These vulnerabilities will be juxtaposed with experiences of positive emotions or situations, and patients will be challenged to use positive emotions to combat negative ones. Special emphasis will be placed on coping with negative emotions in conflict situations, and patients will process healthy conflict resolution skills.  Therapeutic Goals: 1. Patient will identify two positive emotions or experiences to reflect on in order to balance out negative emotions 2. Patient will label two or more emotions that they find the most difficult to experience 3. Patient will demonstrate positive conflict resolution skills through discussion and/or role plays  Summary of Patient Progress:       Therapeutic Modalities:   Cognitive Behavioral Therapy Feelings Identification Dialectical Behavioral Therapy   Yvette Rack, LCSW 03/21/2019 1:45 PM

## 2019-03-21 NOTE — Anesthesia Procedure Notes (Addendum)
Date/Time: 03/21/2019 10:25 AM Performed by: Caryl Asp, CRNA Pre-anesthesia Checklist: Patient identified, Emergency Drugs available, Suction available and Patient being monitored Patient Re-evaluated:Patient Re-evaluated prior to induction Oxygen Delivery Method: Simple face mask Preoxygenation: Pre-oxygenation with 100% oxygen Induction Type: IV induction Ventilation: Mask ventilation without difficulty and Mask ventilation throughout procedure Airway Equipment and Method: Bite block Placement Confirmation: positive ETCO2 Dental Injury: Teeth and Oropharynx as per pre-operative assessment

## 2019-03-21 NOTE — Anesthesia Postprocedure Evaluation (Signed)
Anesthesia Post Note  Patient: Jeanette Rose  Procedure(s) Performed: ECT TX  Patient location during evaluation: PACU Anesthesia Type: General Level of consciousness: awake and alert Pain management: pain level controlled Vital Signs Assessment: post-procedure vital signs reviewed and stable Respiratory status: spontaneous breathing, nonlabored ventilation, respiratory function stable and patient connected to nasal cannula oxygen Cardiovascular status: blood pressure returned to baseline and stable Postop Assessment: no apparent nausea or vomiting Anesthetic complications: no     Last Vitals:  Vitals:   03/21/19 1117 03/21/19 1119  BP: 135/82   Pulse: (!) 105 (!) 108  Resp: (!) 24 19  Temp:  (!) 36.3 C  SpO2: 98% 93%    Last Pain:  Vitals:   03/21/19 1334  TempSrc:   PainSc: 0-No pain                 [name]

## 2019-03-21 NOTE — Progress Notes (Signed)
[facility] MD Progress Note  03/21/2019 3:48 PM Jeanette Rose  MRN:  [id number] Subjective: Follow-up this patient with depression.  She had ECT this morning.  Treatment was without complication.  On the whole she seems a little better.  This afternoon she is back to complaining about various somatic complaints that seem obviously to be anxiety related but this morning she was not nearly as agitated prior to ECT as she had been in the past.  It was easier to calm her down and redirect her this afternoon.  I remain optimistic.  No real new medical problems that need adjusting. Principal Problem: Severe recurrent major depression without psychotic features ([place]) Diagnosis: Principal Problem:   Severe recurrent major depression without psychotic features ([place]) Active Problems:   Type 2 diabetes mellitus with diabetic polyneuropathy, with long-term current use of insulin (HCC)   GERD (gastroesophageal reflux disease)   Hypertension   Obesity (BMI 30-39.9)   Constipation   History of hypothyroidism   Major depression  Total Time spent with patient: 30 minutes  Past Psychiatric History: History of depression with suicidal behavior  Past Medical History:  Past Medical History:  Diagnosis Date  . Acute acalculous cholecystitis s/p lap chole 04/03/2014 04/02/2014  . Allergy   . Anemia   . Anxiety   . Asthma   . Depression   . Fatty liver    noted on CT per [facility] records  . Fibromyalgia   . GERD (gastroesophageal reflux disease)   . Hyperlipidemia associated with type 2 diabetes mellitus ([place])   . Hypertension   . Hypothyroid 2015   Transiently in 2015. Synthroid 78mg was stopped 05/07/2016 w/ tsh nml following.  . Obesity (BMI 30-39.9)   . OSA (obstructive sleep apnea)    non-compliant with CPAP  . Seborrheic dermatitis    [facility]  . Stroke ([facility]    TIA  . [place] 2 [place]     Past Surgical History:  Procedure Laterality Date  .  BREAST REDUCTION SURGERY  06/2008  . CHOLECYSTECTOMY N/A 04/03/2014   Procedure: LAPAROSCOPIC CHOLECYSTECTOMY ;  Surgeon: SAdin Hector MD;  Location: [facility];  Service: General;  Laterality: N/A;  . REDUCTION MAMMAPLASTY    . TUBAL LIGATION     Family History:  Family History  Problem Relation Age of Onset  . Atrial fibrillation Mother   . Heart disease Father        CAD  . Diabetes Brother   . Cancer Maternal Grandmother   . Diabetes Paternal Grandmother   . Fibromyalgia Sister   . Colitis Sister    Family Psychiatric  History: See previous Social History:  Social History   Substance and Sexual Activity  Alcohol Use No     Social History   Substance and Sexual Activity  Drug Use No    Social History   Socioeconomic History  . Marital status: Divorced    Spouse name: Not on file  . Number of children: Not on file  . Years of education: Not on file  . Highest education level: Not on file  Occupational History  . Not on file  Social Needs  . Financial resource strain: Not on file  . Food insecurity    Worry: Not on file    Inability: Not on file  . Transportation needs    Medical: Not on file    Non-medical: Not on file  Tobacco Use  . Smoking status: Former  Smoker    Packs/day: 4.00    Years: 15.00    Pack years: 60.00    Types: Cigarettes    Quit date: 04/22/2004    Years since quitting: 14.9  . Smokeless tobacco: Never Used  . Tobacco comment: 4 pack year hx  Substance and Sexual Activity  . Alcohol use: No  . Drug use: No  . Sexual activity: Never  Lifestyle  . Physical activity    Days per week: Not on file    Minutes per session: Not on file  . Stress: Not on file  Relationships  . Social Herbalist on phone: Not on file    Gets together: Not on file    Attends religious service: Not on file    Active member of club or organization: Not on file    Attends meetings of clubs or organizations: Not on file    Relationship status: Not  on file  Other Topics Concern  . Not on file  Social History Narrative  . Not on file   Additional Social History:                         Sleep: Fair  Appetite:  Fair  Current Medications: Current Facility-Administered Medications  Medication Dose Route Frequency Provider Last Rate Last Dose  . acetaminophen (TYLENOL) tablet 650 mg  650 mg Oral Q6H PRN Deval Mroczka T, MD      . alum & mag hydroxide-simeth (MAALOX/MYLANTA) 200-200-20 MG/5ML suspension 30 mL  30 mL Oral Q4H PRN Malijah Lietz T, MD      . aspirin EC tablet 81 mg  81 mg Oral Daily Genevieve Ritzel T, MD   81 mg at 03/21/19 1138  . atorvastatin (LIPITOR) tablet 80 mg  80 mg Oral q1800 Vallery Mcdade T, MD   80 mg at 03/20/19 1801  . docusate sodium (COLACE) capsule 100 mg  100 mg Oral BID Sasan Wilkie, Madie Reno, MD   100 mg at 03/21/19 1138  . DULoxetine (CYMBALTA) DR capsule 60 mg  60 mg Oral Daily Pierre Cumpton, Madie Reno, MD   60 mg at 03/21/19 1137  . feeding supplement (ENSURE ENLIVE) (ENSURE ENLIVE) liquid 237 mL  237 mL Oral BID BM Johanan Skorupski, Madie Reno, MD   237 mL at 03/19/19 1938  . fentaNYL (SUBLIMAZE) injection 25 mcg  25 mcg Intravenous Q5 min PRN Gunnar Bulla, MD      . fentaNYL (SUBLIMAZE) injection 25 mcg  25 mcg Intravenous Q5 min PRN Gunnar Bulla, MD      . hydrocortisone cream 1 %   Topical BID PRN Holger Sokolowski, Madie Reno, MD   1 application at 34/19/62 1601  . hydrOXYzine (ATARAX/VISTARIL) tablet 50 mg  50 mg Oral Q6H PRN Renesmay Nesbitt, Madie Reno, MD   50 mg at 03/20/19 1129  . insulin aspart (novoLOG) injection 0-15 Units  0-15 Units Subcutaneous TID WC Lovada Barwick, Madie Reno, MD   3 Units at 03/21/19 1136  . irbesartan (AVAPRO) tablet 150 mg  150 mg Oral Daily Amyiah Gaba, Madie Reno, MD   150 mg at 03/20/19 0825  . ketorolac (TORADOL) 30 MG/ML injection 30 mg  30 mg Intravenous Once Rayne Cowdrey T, MD      . LORazepam (ATIVAN) tablet 1 mg  1 mg Oral Q8H PRN Avery Eustice, Madie Reno, MD   1 mg at 03/19/19 1838  . LORazepam (ATIVAN) tablet 1.5 mg  1.5  mg Oral BID Amando Chaput T,  MD   1.5 mg at 03/21/19 1139  . magnesium hydroxide (MILK OF MAGNESIA) suspension 30 mL  30 mL Oral Daily PRN Ubaldo Daywalt T, MD      . menthol-cetylpyridinium (CEPACOL) lozenge 3 mg  1 lozenge Oral PRN Jaiyah Beining T, MD      . metFORMIN (GLUCOPHAGE-XR) 24 hr tablet 1,000 mg  1,000 mg Oral BID WC Zaleah Ternes T, MD   1,000 mg at 03/21/19 1154  . midazolam (VERSED) injection 2 mg  2 mg Intravenous Once Pacey Altizer T, MD      . midazolam (VERSED) injection 2 mg  2 mg Intravenous Once Jalien Weakland T, MD      . multivitamin with minerals tablet 1 tablet  1 tablet Oral Daily Katherinne Mofield, Madie Reno, MD   1 tablet at 03/21/19 1139  . ondansetron (ZOFRAN) 4 MG/2ML injection           . ondansetron (ZOFRAN) injection 4 mg  4 mg Intravenous Once Jazelyn Sipe T, MD      . ondansetron [facility]) injection 4 mg  4 mg Intravenous Once PRN Gunnar Bulla, MD      . ondansetron [facility]) injection 4 mg  4 mg Intravenous Once Aneita Kiger T, MD      . ondansetron [facility]) injection 4 mg  4 mg Intravenous Once PRN Gunnar Bulla, MD      . pantoprazole (PROTONIX) EC tablet 40 mg  40 mg Oral Honor Junes, Madie Reno, MD   40 mg at 03/20/19 7628  . thiamine (VITAMIN B-1) tablet 100 mg  100 mg Oral Daily Serrina Minogue, Madie Reno, MD   100 mg at 03/21/19 1138    Lab Results:  Results for orders placed or performed during the hospital encounter of 03/15/19 (from the past 48 hour(s))  Glucose, capillary     Status: Abnormal   Collection Time: 03/19/19  5:54 PM  Result Value Ref Range   Glucose-Capillary 158 (H) 70 - 99 mg/dL  Glucose, capillary     Status: Abnormal   Collection Time: 03/20/19  7:28 AM  Result Value Ref Range   Glucose-Capillary 206 (H) 70 - 99 mg/dL   Comment 1 Notify RN   Glucose, capillary     Status: Abnormal   Collection Time: 03/20/19 11:26 AM  Result Value Ref Range   Glucose-Capillary 197 (H) 70 - 99 mg/dL  Glucose, capillary     Status: Abnormal   Collection Time:  03/20/19  4:21 PM  Result Value Ref Range   Glucose-Capillary 174 (H) 70 - 99 mg/dL  Glucose, capillary     Status: Abnormal   Collection Time: 03/20/19  9:30 PM  Result Value Ref Range   Glucose-Capillary 122 (H) 70 - 99 mg/dL   Comment 1 Notify RN   Glucose, capillary     Status: Abnormal   Collection Time: 03/21/19  6:30 AM  Result Value Ref Range   Glucose-Capillary 182 (H) 70 - 99 mg/dL   Comment 1 Notify RN   Glucose, capillary     Status: Abnormal   Collection Time: 03/21/19 11:33 AM  Result Value Ref Range   Glucose-Capillary 177 (H) 70 - 99 mg/dL   Comment 1 Notify RN     Blood Alcohol level:  Lab Results  Component Value Date   ETH <10 31/51/7616    Metabolic Disorder Labs: Lab Results  Component Value Date   HGBA1C 6.3 (H) 03/09/2019   MPG 134.11 03/09/2019   MPG 180 (H) 04/03/2014  No results found for: PROLACTIN Lab Results  Component Value Date   CHOL 174 10/09/2018   TRIG 210 (H) 10/09/2018   HDL 44 10/09/2018   CHOLHDL 4.0 10/09/2018   LDLCALC 88 10/09/2018   LDLCALC 77 06/28/2018    Physical Findings: AIMS:  , ,  ,  ,    CIWA:    COWS:     Musculoskeletal: Strength & Muscle Tone: within normal limits Gait & Station: normal Patient leans: N/A  Psychiatric Specialty Exam: Physical Exam  Nursing note and vitals reviewed. Constitutional: She appears well-developed and well-nourished.  HENT:  Head: Normocephalic and atraumatic.  Eyes: Pupils are equal, round, and reactive to light. Conjunctivae are normal.  Neck: Normal range of motion.  Cardiovascular: Regular rhythm and normal heart sounds.  Respiratory: Effort normal.  GI: Soft.  Musculoskeletal: Normal range of motion.  Neurological: She is alert.  Skin: Skin is warm and dry.  Psychiatric: Her speech is normal and behavior is normal. Judgment and thought content normal. Her mood appears anxious. Cognition and memory are normal.    Review of Systems  Constitutional: Negative.    HENT: Negative.   Eyes: Negative.   Respiratory: Negative.   Cardiovascular: Negative.   Gastrointestinal: Positive for heartburn.  Musculoskeletal: Negative.   Skin: Negative.   Neurological: Negative.   Psychiatric/Behavioral: Negative for depression and suicidal ideas. The patient is nervous/anxious.     Blood pressure 135/82, pulse (!) 108, temperature (!) 97.4 F (36.3 C), resp. rate 19, height 5' 4"  (1.626 m), weight 72.6 kg, SpO2 93 %.Body mass index is 27.46 kg/m.  General Appearance: Casual  Eye Contact:  Good  Speech:  Clear and Coherent  Volume:  Normal  Mood:  Anxious  Affect:  Congruent  Thought Process:  Goal Directed  Orientation:  Full (Time, Place, and Person)  Thought Content:  Rumination and Tangential  Suicidal Thoughts:  No  Homicidal Thoughts:  No  Memory:  Immediate;   Fair Recent;   Fair Remote;   Fair  Judgement:  Fair  Insight:  Fair  Psychomotor Activity:  Normal  Concentration:  Concentration: Fair  Recall:  [employer] of Knowledge:  Fair  Language:  Fair  Akathisia:  No  Handed:  Right  AIMS (if indicated):     Assets:  Desire for Improvement Housing Resilience  ADL's:  Intact  Cognition:  Impaired,  Mild  Sleep:  Number of Hours: 7.3     Treatment Plan Summary: Plan As I keep reminding the patient the course of recovery with ECT is usually stepwise.  She seems to be getting better at being able to take reassurance and not panic.  No change to medicine today.  Next ECT Friday.  Alethia Berthold, MD 03/21/2019, 3:48 PM

## 2019-03-22 ENCOUNTER — Other Ambulatory Visit: Payer: Self-pay | Admitting: Psychiatry

## 2019-03-22 LAB — THYROID PANEL WITH TSH
Free Thyroxine Index: 2.6 (ref 1.2–4.9)
T3 Uptake Ratio: 28 % (ref 24–39)
T4, Total: 9.2 ug/dL (ref 4.5–12.0)
TSH: 2.87 u[IU]/mL (ref 0.450–4.500)

## 2019-03-22 LAB — GLUCOSE, CAPILLARY
Glucose-Capillary: 219 mg/dL — ABNORMAL HIGH (ref 70–99)
Glucose-Capillary: 163 mg/dL — ABNORMAL HIGH (ref 70–99)
Glucose-Capillary: 153 mg/dL — ABNORMAL HIGH (ref 70–99)
Glucose-Capillary: 114 mg/dL — ABNORMAL HIGH (ref 70–99)

## 2019-03-22 MED ORDER — DULOXETINE HCL 30 MG PO CPEP
30.0000 mg | ORAL_CAPSULE | Freq: Every day | ORAL | Status: DC
  Administered 2019-03-23 – 2019-03-27 (×5): 30 mg via ORAL
  Filled 2019-03-22 (×5): qty 1

## 2019-03-22 NOTE — Progress Notes (Signed)
[facility] MD Progress Note  03/22/2019 2:53 PM Jeanette Rose  MRN:  [id number] Subjective: Patient seen chart reviewed.  Patient with recurrent depression and anxiety receiving ECT.  Today the patient said that she thought she was doing a little bit worse than yesterday.  Indeed she is still preoccupied with her sense of anxiety.  Spent some time trying to get her to articulate what she is nervous about and then getting her to commit to writing this down and trying to think about things more objectively rather than just getting overwhelmed.  She was open to doing this and seems to be making a little progress.  To me she still seems like she is doing better than when she first came in.  She was less somatic today and was not visibly tremulous.  No current suicidal thought.  Still very negative and worried about her outpatient situation.  Tolerating ECT without difficulty. Principal Problem: Severe recurrent major depression without psychotic features ([place]) Diagnosis: Principal Problem:   Severe recurrent major depression without psychotic features ([place]) Active Problems:   Type 2 diabetes mellitus with diabetic polyneuropathy, with long-term current use of insulin (HCC)   GERD (gastroesophageal reflux disease)   Hypertension   Obesity (BMI 30-39.9)   Constipation   History of hypothyroidism   Major depression  Total Time spent with patient: 30 minutes  Past Psychiatric History: Recurrent depression this is her first trial of ECT.  Past history of suicidality.  Past Medical History:  Past Medical History:  Diagnosis Date  . Acute acalculous cholecystitis s/p lap chole 04/03/2014 04/02/2014  . Allergy   . Anemia   . Anxiety   . Asthma   . Depression   . Fatty liver    noted on CT per [facility] records  . Fibromyalgia   . GERD (gastroesophageal reflux disease)   . Hyperlipidemia associated with type 2 diabetes mellitus ([place])   . Hypertension   . Hypothyroid 2015   Transiently in 2015. Synthroid 75mg  was stopped 05/07/2016 w/ tsh nml following.  . Obesity (BMI 30-39.9)   . OSA (obstructive sleep apnea)    non-compliant with CPAP  . Seborrheic dermatitis    [facility]  . Stroke ([facility]    TIA  . [place] 2 [place]     Past Surgical History:  Procedure Laterality Date  . BREAST REDUCTION SURGERY  06/2008  . CHOLECYSTECTOMY N/A 04/03/2014   Procedure: LAPAROSCOPIC CHOLECYSTECTOMY ;  Surgeon: SAdin Hector MD;  Location: [facility];  Service: General;  Laterality: N/A;  . REDUCTION MAMMAPLASTY    . TUBAL LIGATION     Family History:  Family History  Problem Relation Age of Onset  . Atrial fibrillation Mother   . Heart disease Father        CAD  . Diabetes Brother   . Cancer Maternal Grandmother   . Diabetes Paternal Grandmother   . Fibromyalgia Sister   . Colitis Sister    Family Psychiatric  History: See previous Social History:  Social History   Substance and Sexual Activity  Alcohol Use No     Social History   Substance and Sexual Activity  Drug Use No    Social History   Socioeconomic History  . Marital status: Divorced    Spouse name: Not on file  . Number of children: Not on file  . Years of education: Not on file  . Highest education level: Not on file  Occupational  History  . Not on file  Social Needs  . Financial resource strain: Not on file  . Food insecurity    Worry: Not on file    Inability: Not on file  . Transportation needs    Medical: Not on file    Non-medical: Not on file  Tobacco Use  . Smoking status: Former Smoker    Packs/day: 4.00    Years: 15.00    Pack years: 60.00    Types: Cigarettes    Quit date: 04/22/2004    Years since quitting: 14.9  . Smokeless tobacco: Never Used  . Tobacco comment: 4 pack year hx  Substance and Sexual Activity  . Alcohol use: No  . Drug use: No  . Sexual activity: Never  Lifestyle  . Physical activity    Days per week: Not on file    Minutes per  session: Not on file  . Stress: Not on file  Relationships  . Social Herbalist on phone: Not on file    Gets together: Not on file    Attends religious service: Not on file    Active member of club or organization: Not on file    Attends meetings of clubs or organizations: Not on file    Relationship status: Not on file  Other Topics Concern  . Not on file  Social History Narrative  . Not on file   Additional Social History:                         Sleep: Fair  Appetite:  Fair  Current Medications: Current Facility-Administered Medications  Medication Dose Route Frequency Provider Last Rate Last Dose  . acetaminophen (TYLENOL) tablet 650 mg  650 mg Oral Q6H PRN Braulio Kiedrowski, Madie Reno, MD   650 mg at 03/22/19 1143  . alum & mag hydroxide-simeth (MAALOX/MYLANTA) 200-200-20 MG/5ML suspension 30 mL  30 mL Oral Q4H PRN Sricharan Lacomb T, MD      . aspirin EC tablet 81 mg  81 mg Oral Daily Ezrah Panning, Madie Reno, MD   81 mg at 03/22/19 0745  . atorvastatin (LIPITOR) tablet 80 mg  80 mg Oral q1800 Auriana Scalia, Madie Reno, MD   80 mg at 03/21/19 1805  . docusate sodium (COLACE) capsule 100 mg  100 mg Oral BID Daniell Paradise, Madie Reno, MD   100 mg at 03/22/19 0746  . [START ON 03/23/2019] DULoxetine (CYMBALTA) DR capsule 30 mg  30 mg Oral Daily Ileigh Mettler T, MD      . feeding supplement (ENSURE ENLIVE) (ENSURE ENLIVE) liquid 237 mL  237 mL Oral BID BM Ulys Favia, Madie Reno, MD   237 mL at 03/19/19 1938  . fentaNYL (SUBLIMAZE) injection 25 mcg  25 mcg Intravenous Q5 min PRN Gunnar Bulla, MD      . fentaNYL (SUBLIMAZE) injection 25 mcg  25 mcg Intravenous Q5 min PRN Gunnar Bulla, MD      . hydrocortisone cream 1 %   Topical BID PRN Aliyanna Wassmer, Madie Reno, MD   1 application at 40/34/74 1601  . hydrOXYzine (ATARAX/VISTARIL) tablet 50 mg  50 mg Oral Q6H PRN Kerrie Latour, Madie Reno, MD   50 mg at 03/20/19 1129  . insulin aspart (novoLOG) injection 0-15 Units  0-15 Units Subcutaneous TID WC Lashante Fryberger, Madie Reno, MD   3 Units  at 03/22/19 1146  . irbesartan (AVAPRO) tablet 150 mg  150 mg Oral Daily Stephie Xu, Madie Reno, MD   150 mg  at 03/22/19 0747  . ketorolac (TORADOL) 30 MG/ML injection 30 mg  30 mg Intravenous Once Caroleann Casler T, MD      . LORazepam (ATIVAN) tablet 1 mg  1 mg Oral Q8H PRN Nikol Lemar, Madie Reno, MD   1 mg at 03/19/19 1838  . LORazepam (ATIVAN) tablet 1.5 mg  1.5 mg Oral BID Ameah Chanda, Madie Reno, MD   1.5 mg at 03/22/19 0744  . magnesium hydroxide (MILK OF MAGNESIA) suspension 30 mL  30 mL Oral Daily PRN Alvia Jablonski T, MD      . menthol-cetylpyridinium (CEPACOL) lozenge 3 mg  1 lozenge Oral PRN Coltrane Tugwell T, MD      . metFORMIN (GLUCOPHAGE-XR) 24 hr tablet 1,000 mg  1,000 mg Oral BID WC Audy Dauphine T, MD   1,000 mg at 03/22/19 0747  . midazolam (VERSED) injection 2 mg  2 mg Intravenous Once Carter Kaman T, MD      . midazolam (VERSED) injection 2 mg  2 mg Intravenous Once Suetta Hoffmeister T, MD      . multivitamin with minerals tablet 1 tablet  1 tablet Oral Daily Bre Pecina, Madie Reno, MD   1 tablet at 03/22/19 0744  . ondansetron (ZOFRAN) injection 4 mg  4 mg Intravenous Once Julliette Frentz T, MD      . ondansetron [facility]) injection 4 mg  4 mg Intravenous Once PRN Gunnar Bulla, MD      . ondansetron [facility]) injection 4 mg  4 mg Intravenous Once Alysha Doolan T, MD      . ondansetron [facility]) injection 4 mg  4 mg Intravenous Once PRN Gunnar Bulla, MD      . pantoprazole (PROTONIX) EC tablet 40 mg  40 mg Oral Honor Junes, Madie Reno, MD   40 mg at 03/22/19 0746  . thiamine (VITAMIN B-1) tablet 100 mg  100 mg Oral Daily Shellby Schlink, Madie Reno, MD   100 mg at 03/22/19 0745    Lab Results:  Results for orders placed or performed during the hospital encounter of 03/15/19 (from the past 48 hour(s))  Glucose, capillary     Status: Abnormal   Collection Time: 03/20/19  4:21 PM  Result Value Ref Range   Glucose-Capillary 174 (H) 70 - 99 mg/dL  Glucose, capillary     Status: Abnormal   Collection Time: 03/20/19  9:30 PM   Result Value Ref Range   Glucose-Capillary 122 (H) 70 - 99 mg/dL   Comment 1 Notify RN   Glucose, capillary     Status: Abnormal   Collection Time: 03/21/19  6:30 AM  Result Value Ref Range   Glucose-Capillary 182 (H) 70 - 99 mg/dL   Comment 1 Notify RN   Thyroid Panel With TSH     Status: None   Collection Time: 03/21/19  7:05 AM  Result Value Ref Range   TSH 2.870 0.450 - 4.500 uIU/mL   T4, Total 9.2 4.5 - 12.0 ug/dL   T3 Uptake Ratio 28 24 - 39 %   Free Thyroxine Index 2.6 1.2 - 4.9    Comment: (NOTE) Performed At: [facility] [address] [place], [place] [id number] Rush Farmer MD [id number]   Glucose, capillary     Status: Abnormal   Collection Time: 03/21/19 11:33 AM  Result Value Ref Range   Glucose-Capillary 177 (H) 70 - 99 mg/dL   Comment 1 Notify RN   Glucose, capillary     Status: Abnormal   Collection Time: 03/21/19  4:25 PM  Result Value Ref Range   Glucose-Capillary 219 (H) 70 - 99 mg/dL  Glucose, capillary     Status: Abnormal   Collection Time: 03/22/19  7:00 AM  Result Value Ref Range   Glucose-Capillary 163 (H) 70 - 99 mg/dL   Comment 1 Notify RN     Blood Alcohol level:  Lab Results  Component Value Date   ETH <10 54/27/0623    Metabolic Disorder Labs: Lab Results  Component Value Date   HGBA1C 6.3 (H) 03/09/2019   MPG 134.11 03/09/2019   MPG 180 (H) 04/03/2014   No results found for: PROLACTIN Lab Results  Component Value Date   CHOL 174 10/09/2018   TRIG 210 (H) 10/09/2018   HDL 44 10/09/2018   CHOLHDL 4.0 10/09/2018   LDLCALC 88 10/09/2018   LDLCALC 77 06/28/2018    Physical Findings: AIMS:  , ,  ,  ,    CIWA:    COWS:     Musculoskeletal: Strength & Muscle Tone: within normal limits Gait & Station: normal Patient leans: N/A  Psychiatric Specialty Exam: Physical Exam  Nursing note and vitals reviewed. Constitutional: She appears well-developed and well-nourished.  HENT:  Head: Normocephalic and  atraumatic.  Eyes: Pupils are equal, round, and reactive to light. Conjunctivae are normal.  Neck: Normal range of motion.  Cardiovascular: Regular rhythm and normal heart sounds.  Respiratory: Effort normal. No respiratory distress.  GI: Soft.  Musculoskeletal: Normal range of motion.  Neurological: She is alert.  Skin: Skin is warm and dry.  Psychiatric: Her speech is normal. Judgment normal. Her mood appears anxious. She is agitated. She is not aggressive. Thought content is not paranoid. She expresses no homicidal and no suicidal ideation. She exhibits abnormal recent memory.    Review of Systems  Constitutional: Negative.   HENT: Negative.   Eyes: Negative.   Respiratory: Negative.   Cardiovascular: Negative.   Gastrointestinal: Negative.   Musculoskeletal: Negative.   Skin: Negative.   Neurological: Negative.   Psychiatric/Behavioral: Positive for depression. Negative for hallucinations, substance abuse and suicidal ideas. The patient is nervous/anxious.     Blood pressure 135/71, pulse (!) 101, temperature 98.6 F (37 C), temperature source Oral, resp. rate 19, height 5' 4"  (1.626 m), weight 72.6 kg, SpO2 96 %.Body mass index is 27.46 kg/m.  General Appearance: Casual  Eye Contact:  Good  Speech:  Normal Rate  Volume:  Normal  Mood:  Anxious  Affect:  Congruent  Thought Process:  Goal Directed  Orientation:  Full (Time, Place, and Person)  Thought Content:  Logical  Suicidal Thoughts:  No  Homicidal Thoughts:  No  Memory:  Immediate;   Fair Recent;   Fair Remote;   Fair  Judgement:  Fair  Insight:  Fair  Psychomotor Activity:  Restlessness  Concentration:  Concentration: Fair  Recall:  [employer] of Knowledge:  Fair  Language:  Fair  Akathisia:  No  Handed:  Right  AIMS (if indicated):     Assets:  Desire for Improvement Housing  ADL's:  Impaired  Cognition:  WNL  Sleep:  Number of Hours: 7     Treatment Plan Summary: Daily contact with patient to  assess and evaluate symptoms and progress in treatment, Medication management and Plan Working on getting her to control her anxiety.  She is attending some groups although she finds it difficult being in social situations.  Medically stable.  Next ECT treatment tomorrow.  Hoping that perhaps we can reach a place  where we could conceivably switch to outpatient by next week.  Alethia Berthold, MD 03/22/2019, 2:53 PM

## 2019-03-22 NOTE — Plan of Care (Signed)
Patient presents with an anxious affect.   Problem: Self-Concept: Goal: Level of anxiety will decrease Outcome: Not Progressing

## 2019-03-22 NOTE — Progress Notes (Signed)
D - Patient was in the day room upon arrival to the unit. Patient was pleasant during assessment but did present with an anxious affect. Patient stated, "I didn't get my Lipitor at 6 tonight and I need it." MAR was checked, patient was give education. Patient still said she didn't get it even though our records show that she did. Patient given education. Patient denies SI/HI/AVH and pain. Patient couldn't rate her depression or anxiety but did stated, "I think its getting better."   A - Patient didn't have any medications this evening. Patient compliant with procedures on the unit. Patient given education. Patient given support and encouragement to be active in her treatment plan. Patient informed to let staff know if there are any issues or problems on the unit.   R - Patient being monitored Q 15 minutes for safety per unit protocol. Patient remains safe on the unit.

## 2019-03-22 NOTE — Plan of Care (Signed)
Pt denies SI, HI and AVH. Pt rates depression and anxiety both 6/10. Pt was educated on care plan and verbalizes understanding. Pt does not have a goal. Collier Bullock RN Problem: Education: Goal: Utilization of techniques to improve thought processes will improve Outcome: Progressing Goal: Knowledge of the prescribed therapeutic regimen will improve Outcome: Progressing   Problem: Activity: Goal: Interest or engagement in leisure activities will improve Outcome: Progressing Goal: Imbalance in normal sleep/wake cycle will improve Outcome: Progressing   Problem: Coping: Goal: Coping ability will improve Outcome: Progressing Goal: Will verbalize feelings Outcome: Progressing   Problem: Health Behavior/Discharge Planning: Goal: Ability to make decisions will improve Outcome: Progressing Goal: Compliance with therapeutic regimen will improve Outcome: Progressing   Problem: Role Relationship: Goal: Will demonstrate positive changes in social behaviors and relationships Outcome: Progressing   Problem: Safety: Goal: Ability to disclose and discuss suicidal ideas will improve Outcome: Progressing Goal: Ability to identify and utilize support systems that promote safety will improve Outcome: Progressing   Problem: Self-Concept: Goal: Will verbalize positive feelings about self Outcome: Progressing Goal: Level of anxiety will decrease Outcome: Progressing   Problem: Education: Goal: Knowledge of [facility] Education information/materials will improve Outcome: Progressing Goal: Emotional status will improve Outcome: Progressing Goal: Mental status will improve Outcome: Progressing Goal: Verbalization of understanding the information provided will improve Outcome: Progressing   Problem: Self-Concept: Goal: Level of anxiety will decrease Outcome: Progressing

## 2019-03-22 NOTE — Progress Notes (Signed)
Recreation Therapy Notes   Date: 03/22/2019  Time: 9:30 am   Location: Craft room   Behavioral response: N/A   Intervention Topic: Coping skills  Discussion/Intervention: Patient did not attend group.   Clinical Observations/Feedback:  Patient did not attend group.   Nawaal Alling LRT/CTRS        Marelly Wehrman 03/22/2019 10:40 AM

## 2019-03-22 NOTE — [facility] Group Notes (Signed)
[facility] Group Notes:  (Nursing/MHT/Case Management/Adjunct)  Date:  03/22/2019  Time:  9:07 PM  Type of Therapy:  Group Therapy  Participation Level:  Minimal  Participation Quality:  Appropriate  Affect:  Appropriate  Cognitive:  Appropriate  Insight:  Good  Engagement in Group:  Engaged  Modes of Intervention:  Support  Summary of Progress/Problems:  Jeanette Rose 03/22/2019, 9:07 PM

## 2019-03-22 NOTE — [facility] Group Notes (Signed)
LCSW Group Therapy Note  03/22/2019 1:00 PM  Type of Therapy/Topic:  Group Therapy:  Balance in Life  Participation Level:  Minimal  Description of Group:    This group will address the concept of balance and how it feels and looks when one is unbalanced. Patients will be encouraged to process areas in their lives that are out of balance and identify reasons for remaining unbalanced. Facilitators will guide patients in utilizing problem-solving interventions to address and correct the stressor making their life unbalanced. Understanding and applying boundaries will be explored and addressed for obtaining and maintaining a balanced life. Patients will be encouraged to explore ways to assertively make their unbalanced needs known to significant others in their lives, using other group members and facilitator for support and feedback.  Therapeutic Goals: 1. Patient will identify two or more emotions or situations they have that consume much of in their lives. 2. Patient will identify signs/triggers that life has become out of balance:  3. Patient will identify two ways to set boundaries in order to achieve balance in their lives:  4. Patient will demonstrate ability to communicate their needs through discussion and/or role plays  Summary of Patient Progress: Patient was present and appeared to be attentive in group.  Patient did not engage in discussion until discussion turned toward coping skills and patient mentioned that she uses her dog to cope.   Therapeutic Modalities:   Cognitive Behavioral Therapy Solution-Focused Therapy Assertiveness Training  Assunta Curtis MSW, LCSW 03/22/2019 11:57 AM

## 2019-03-23 ENCOUNTER — Inpatient Hospital Stay: Payer: BLUE CROSS/BLUE SHIELD | Admitting: Anesthesiology

## 2019-03-23 ENCOUNTER — Other Ambulatory Visit: Payer: Self-pay | Admitting: Psychiatry

## 2019-03-23 LAB — GLUCOSE, CAPILLARY
Glucose-Capillary: 126 mg/dL — ABNORMAL HIGH (ref 70–99)
Glucose-Capillary: 171 mg/dL — ABNORMAL HIGH (ref 70–99)
Glucose-Capillary: 159 mg/dL — ABNORMAL HIGH (ref 70–99)

## 2019-03-23 MED ORDER — SUCCINYLCHOLINE CHLORIDE 20 MG/ML IJ SOLN
INTRAMUSCULAR | Status: DC | PRN
  Administered 2019-03-23: 100 mg via INTRAVENOUS

## 2019-03-23 MED ORDER — SODIUM CHLORIDE 0.9 % IV SOLN
500.0000 mL | Freq: Once | INTRAVENOUS | Status: AC
  Administered 2019-03-26: 11:00:00 via INTRAVENOUS

## 2019-03-23 MED ORDER — METHOHEXITAL SODIUM 0.5 G IJ SOLR
INTRAMUSCULAR | Status: AC
  Filled 2019-03-23: qty 500

## 2019-03-23 MED ORDER — LORAZEPAM 1 MG PO TABS
1.0000 mg | ORAL_TABLET | Freq: Two times a day (BID) | ORAL | Status: DC
  Administered 2019-03-24 – 2019-03-30 (×13): 1 mg via ORAL
  Filled 2019-03-23 (×13): qty 1

## 2019-03-23 MED ORDER — KETOROLAC TROMETHAMINE 30 MG/ML IJ SOLN
30.0000 mg | Freq: Once | INTRAMUSCULAR | Status: AC
  Administered 2019-03-26: 10:00:00 30 mg via INTRAVENOUS

## 2019-03-23 MED ORDER — GLYCOPYRROLATE 0.2 MG/ML IJ SOLN: 0.2000 mg | Freq: Once | INTRAMUSCULAR | Status: DC

## 2019-03-23 MED ORDER — MIDAZOLAM HCL 2 MG/2ML IJ SOLN
2.0000 mg | Freq: Once | INTRAMUSCULAR | Status: AC
  Administered 2019-03-28: 2 mg via INTRAVENOUS

## 2019-03-23 MED ORDER — METHOHEXITAL SODIUM 100 MG/10ML IV SOSY
PREFILLED_SYRINGE | INTRAVENOUS | Status: DC | PRN
  Administered 2019-03-23: 80 mg via INTRAVENOUS

## 2019-03-23 MED ORDER — ONDANSETRON HCL 4 MG/2ML IJ SOLN: 4.0000 mg | Freq: Once | INTRAMUSCULAR | Status: DC | PRN

## 2019-03-23 MED ORDER — LABETALOL HCL 5 MG/ML IV SOLN
INTRAVENOUS | Status: AC
  Filled 2019-03-23: qty 4

## 2019-03-23 MED ORDER — FENTANYL CITRATE (PF) 100 MCG/2ML IJ SOLN: 25.0000 ug | INTRAMUSCULAR | Status: DC | PRN

## 2019-03-23 MED ORDER — MIDAZOLAM HCL 2 MG/2ML IJ SOLN
INTRAMUSCULAR | Status: AC
  Filled 2019-03-23: qty 2

## 2019-03-23 NOTE — Anesthesia Postprocedure Evaluation (Signed)
Anesthesia Post Note  Patient: Jeanette Rose  Procedure(s) Performed: ECT TX  Patient location during evaluation: PACU Anesthesia Type: General Level of consciousness: awake and alert Pain management: pain level controlled Vital Signs Assessment: post-procedure vital signs reviewed and stable Respiratory status: spontaneous breathing, nonlabored ventilation, respiratory function stable and patient connected to nasal cannula oxygen Cardiovascular status: blood pressure returned to baseline and stable Postop Assessment: no apparent nausea or vomiting Anesthetic complications: no     Last Vitals:  Vitals:   03/23/19 1054 03/23/19 1100  BP: (!) 156/82 (!) 150/73  Pulse: 88 81  Resp: (!) 25 19  Temp:  (!) 36.4 C  SpO2: 97% 94%    Last Pain:  Vitals:   03/23/19 1100  TempSrc:   PainSc: 0-No pain                 Sharon Stapel S

## 2019-03-23 NOTE — Transfer of Care (Signed)
Immediate Anesthesia Transfer of Care Note  Patient: Jeanette Rose  Procedure(s) Performed: ECT TX  Patient Location: PACU  Anesthesia Type:General  Level of Consciousness: awake  Airway & Oxygen Therapy: Patient Spontanous Breathing  Post-op Assessment: Report given to RN  Post vital signs: stable  Last Vitals:  Vitals Value Taken Time  BP    Temp    Pulse 100 03/23/19 1039  Resp 21 03/23/19 1039  SpO2 94 % 03/23/19 1039  Vitals shown include unvalidated device data.  Last Pain:  Vitals:   03/23/19 0957  TempSrc:   PainSc: 0-No pain      Patients Stated Pain Goal: 4 (36/85/99 2341)  Complications: No apparent anesthesia complications

## 2019-03-23 NOTE — Progress Notes (Signed)
Recreation Therapy Notes   Date: 03/23/2019  Time: 9:30 am   Location: Craft room   Behavioral response: N/A   Intervention Topic: Self-care  Discussion/Intervention: Patient did not attend group.   Clinical Observations/Feedback:  Patient did not attend group.   Nashae Maudlin LRT/CTRS

## 2019-03-23 NOTE — Plan of Care (Signed)
  Problem: Education: Goal: Utilization of techniques to improve thought processes will improve Outcome: Progressing Goal: Knowledge of the prescribed therapeutic regimen will improve Outcome: Progressing   Problem: Activity: Goal: Interest or engagement in leisure activities will improve Outcome: Progressing Goal: Imbalance in normal sleep/wake cycle will improve Outcome: Progressing   Problem: Coping: Goal: Coping ability will improve Outcome: Progressing Goal: Will verbalize feelings Outcome: Progressing   Problem: Health Behavior/Discharge Planning: Goal: Ability to make decisions will improve Outcome: Progressing Goal: Compliance with therapeutic regimen will improve Outcome: Progressing   Problem: Role Relationship: Goal: Will demonstrate positive changes in social behaviors and relationships Outcome: Progressing   Problem: Safety: Goal: Ability to disclose and discuss suicidal ideas will improve Outcome: Progressing Goal: Ability to identify and utilize support systems that promote safety will improve Outcome: Progressing   Problem: Self-Concept: Goal: Will verbalize positive feelings about self Outcome: Progressing Goal: Level of anxiety will decrease Outcome: Progressing   Problem: Education: Goal: Knowledge of [facility] Education information/materials will improve Outcome: Progressing Goal: Emotional status will improve Outcome: Progressing Goal: Mental status will improve Outcome: Progressing Goal: Verbalization of understanding the information provided will improve Outcome: Progressing   Problem: Self-Concept: Goal: Level of anxiety will decrease Outcome: Progressing

## 2019-03-23 NOTE — Anesthesia Post-op Follow-up Note (Signed)
Anesthesia QCDR form completed.        

## 2019-03-23 NOTE — Progress Notes (Signed)
Patient alert and oriented x 4, affect is flat but she brightens upon approach, she's  assertive her thoughts are organized and coherent, she appears less anxious stated " ECT is really helping me"  Patient denies SI/HI/AVH and rated depression a 4/10 ( 0 low - 10 high ) patient is complaint with medication. Emotional support and encouragement given to patients he attended evening wrap up group. Patient attends group and was appropriate,  15 minutes safety checks maintained will continue to monitor.

## 2019-03-23 NOTE — Plan of Care (Signed)
  Problem: Education: Goal: Utilization of techniques to improve thought processes will improve Outcome: Progressing  Thoughts are improving; its coherent , she appears less anxious.

## 2019-03-23 NOTE — [facility] Group Notes (Signed)
LCSW Group Therapy Note  03/23/2019 2:02 PM  Type of Therapy/Topic:  Group Therapy:  Balance in Life  Participation Level:  None  Description of Group:    This group will address the concept of balance and how it feels and looks when one is unbalanced. Patients will be encouraged to process areas in their lives that are out of balance and identify reasons for remaining unbalanced. Facilitators will guide patients in utilizing problem-solving interventions to address and correct the stressor making their life unbalanced. Understanding and applying boundaries will be explored and addressed for obtaining and maintaining a balanced life. Patients will be encouraged to explore ways to assertively make their unbalanced needs known to significant others in their lives, using other group members and facilitator for support and feedback.  Therapeutic Goals: 1. Patient will identify two or more emotions or situations they have that consume much of in their lives. 2. Patient will identify signs/triggers that life has become out of balance:  3. Patient will identify two ways to set boundaries in order to achieve balance in their lives:  4. Patient will demonstrate ability to communicate their needs through discussion and/or role plays  Summary of Patient Progress: Pt was present in group, but did not participate in the group discussion.     Therapeutic Modalities:   Cognitive Behavioral Therapy Solution-Focused Therapy Assertiveness Training  Evalina Field, MSW, LCSW Clinical Social Work 03/23/2019 2:02 PM

## 2019-03-23 NOTE — H&P (Signed)
Jeanette Rose is an 63 y.o. female.   Chief Complaint: Continues to have anxiety and depression but seems overall a little bit better more energetic less panic HPI: History of recurrent depression currently inpatient getting index course ECT  Past Medical History:  Diagnosis Date  . Acute acalculous cholecystitis s/p lap chole 04/03/2014 04/02/2014  . Allergy   . Anemia   . Anxiety   . Asthma   . Depression   . Fatty liver    noted on CT per [facility] records  . Fibromyalgia   . GERD (gastroesophageal reflux disease)   . Hyperlipidemia associated with type 2 diabetes mellitus ([place])   . Hypertension   . Hypothyroid 2015   Transiently in 2015. Synthroid 70mg was stopped 05/07/2016 w/ tsh nml following.  . Obesity (BMI 30-39.9)   . OSA (obstructive sleep apnea)    non-compliant with CPAP  . Seborrheic dermatitis    [facility]  . [facility]    [place]IA  . [place]     Past Surgical History:  Procedure Laterality Date  . BREAST REDUCTION SURGERY  06/2008  . CHOLECYSTECTOMY N/A 04/03/2014   Procedure: LAPAROSCOPIC CHOLECYSTECTOMY ;  Surgeon: SAdin Hector MD;  Location: [facility] ORS;  Service: General;  Laterality: N/A;  . REDUCTION MAMMAPLASTY    . TUBAL LIGATION      Family History  Problem Relation Age of Onset  . Atrial fibrillation Mother   . Heart disease Father        CAD  . Diabetes Brother   . Cancer Maternal Grandmother   . Diabetes Paternal Grandmother   . Fibromyalgia Sister   . Colitis Sister    Social History:  reports that she quit smoking about 14 years ago. Her smoking use included cigarettes. She has a 60.00 pack-year smoking history. She has never used smokeless tobacco. She reports that she does not drink alcohol or use drugs.  Allergies:  Allergies  Allergen Reactions  . Farxiga [Dapagliflozin] Anxiety and Other (See Comments)    Made depression and anxiety worse  . Rexulti [Brexpiprazole] Anxiety and  Other (See Comments)    Made anxiety worse  . Trintellix [Vortioxetine] Anxiety and Other (See Comments)    Made anxiety worse    Medications Prior to Admission  Medication Sig Dispense Refill  . aspirin (ASPIRIN LOW DOSE) 81 MG tablet Take 81 mg by mouth daily.     .Marland Kitchenatorvastatin (LIPITOR) 80 MG tablet TAKE 1 TABLET (80 MG TOTAL) BY MOUTH DAILY AT 6 PM. 90 tablet 0  . DULoxetine (CYMBALTA) 60 MG capsule Take 1 capsule (60 mg total) by mouth daily. 1 capsule 0  . feeding supplement, ENSURE ENLIVE, (ENSURE ENLIVE) LIQD Take 237 mLs by mouth 2 (two) times daily between meals. 1 mL 0  . hydrOXYzine (ATARAX/VISTARIL) 25 MG tablet Take 1 tablet (25 mg total) by mouth 3 (three) times daily as needed for anxiety. 1 tablet 0  . irbesartan (AVAPRO) 150 MG tablet Take 1 tablet (150 mg total) by mouth daily. 1 tablet 0  . LORazepam (ATIVAN) 0.5 MG tablet Take 3 tablets (1.5 mg total) by mouth at bedtime. 1 tablet 0  . LORazepam (ATIVAN) 1 MG tablet Take 1 tablet (1 mg total) by mouth every 8 (eight) hours as needed (withdrawal symptoms). 1 tablet 0  . metFORMIN (FORTAMET) 1000 MG (OSM) 24 hr tablet Take 1 tablet (1,000 mg total) by mouth 2 (two) times daily  with a meal. 1 tablet 0  . Multiple Vitamin (MULTIVITAMIN WITH MINERALS) TABS tablet Take 1 tablet by mouth daily. 1 tablet 0  . nitrofurantoin, macrocrystal-monohydrate, (MACROBID) 100 MG capsule Take 1 capsule (100 mg total) by mouth every 12 (twelve) hours. 5 capsule 0  . nortriptyline (PAMELOR) 25 MG capsule Take 1 capsule (25 mg total) by mouth at bedtime. 1 capsule 0  . nortriptyline (PAMELOR) 25 MG capsule Take 1 capsule (25 mg total) by mouth daily. 1 capsule 0  . Oxcarbazepine (TRILEPTAL) 300 MG tablet Take 1 tablet (300 mg total) by mouth 2 (two) times daily. 1 tablet 0  . pantoprazole (PROTONIX) 40 MG tablet Take 1 tablet (40 mg total) by mouth every morning. 1 tablet 0  . polyethylene glycol (MIRALAX / GLYCOLAX) 17 g packet Take 17 g by  mouth daily as needed for mild constipation or moderate constipation. 1 each 0  . thiamine 100 MG tablet Take 1 tablet (100 mg total) by mouth daily. 1 tablet 0    Results for orders placed or performed during the hospital encounter of 03/15/19 (from the past 48 hour(s))  Glucose, capillary     Status: Abnormal   Collection Time: 03/21/19 11:33 AM  Result Value Ref Range   Glucose-Capillary 177 (H) 70 - 99 mg/dL   Comment 1 Notify RN   Glucose, capillary     Status: Abnormal   Collection Time: 03/21/19  4:25 PM  Result Value Ref Range   Glucose-Capillary 219 (H) 70 - 99 mg/dL  Glucose, capillary     Status: Abnormal   Collection Time: 03/22/19  7:00 AM  Result Value Ref Range   Glucose-Capillary 163 (H) 70 - 99 mg/dL   Comment 1 Notify RN   Glucose, capillary     Status: Abnormal   Collection Time: 03/22/19 11:44 AM  Result Value Ref Range   Glucose-Capillary 153 (H) 70 - 99 mg/dL   Comment 1 Notify RN   Glucose, capillary     Status: Abnormal   Collection Time: 03/22/19  4:12 PM  Result Value Ref Range   Glucose-Capillary 114 (H) 70 - 99 mg/dL   Comment 1 Notify RN   Glucose, capillary     Status: Abnormal   Collection Time: 03/22/19  9:00 PM  Result Value Ref Range   Glucose-Capillary 126 (H) 70 - 99 mg/dL   Comment 1 Notify RN   Glucose, capillary     Status: Abnormal   Collection Time: 03/23/19  6:47 AM  Result Value Ref Range   Glucose-Capillary 171 (H) 70 - 99 mg/dL   Comment 1 Notify RN    No results found.  Review of Systems  Constitutional: Negative.   HENT: Negative.   Eyes: Negative.   Respiratory: Negative.   Cardiovascular: Negative.   Gastrointestinal: Negative.   Musculoskeletal: Negative.   Skin: Negative.   Neurological: Negative.   Psychiatric/Behavioral: Positive for depression. The patient is nervous/anxious.     Blood pressure 129/72, pulse 100, temperature 98.3 F (36.8 C), temperature source Oral, resp. rate 18, height 5' 4"  (1.626 m),  weight 72.6 kg, SpO2 97 %. Physical Exam  Nursing note and vitals reviewed. Constitutional: She appears well-developed and well-nourished.  HENT:  Head: Normocephalic and atraumatic.  Eyes: Pupils are equal, round, and reactive to light. Conjunctivae are normal.  Neck: Normal range of motion.  Cardiovascular: Regular rhythm and normal heart sounds.  Respiratory: Effort normal.  GI: Soft.  Musculoskeletal: Normal range of motion.  Neurological:  She is alert.  Skin: Skin is warm and dry.  Psychiatric: Judgment normal. Her mood appears anxious. Her speech is delayed. She is slowed. Thought content is not paranoid. Cognition and memory are normal.     Assessment/Plan Today's treatment #4 continue into next week inpatient and reconsider possible outpatient treatment  Alethia Berthold, MD 03/23/2019, 10:22 AM

## 2019-03-23 NOTE — Progress Notes (Signed)
[facility] MD Progress Note  03/23/2019 4:11 PM Jeanette Rose  MRN:  [id number] Subjective: Follow-up for this patient with severe depression.  Patient seen.  She also had ECT this morning.  I also spoke with her sister on the telephone this afternoon.  Patient acknowledges that there is some improvement in her mood and anxiety.  She is not nearly as agitated nervous and panicky as she was when she was first admitted.  She acknowledges feeling there is been some improvement with the ECT.  She still gets hung up and overwhelmed thinking about her problems outside the hospital.  After speaking with her sister this afternoon it sounds like there may be a good reason for that.  It sounds like her home is in terrible condition and would be hard for her to take care of under the best of circumstances.  Patient still has passive suicidal thoughts without any intent or plan.  She is having some short-term memory problems but otherwise remains cognitively in pretty good shape.  Blood sugars staying in the mid 100s for the most part.  Physically staying stable. Principal Problem: Severe recurrent major depression without psychotic features ([place]) Diagnosis: Principal Problem:   Severe recurrent major depression without psychotic features ([place]) Active Problems:   Type 2 diabetes mellitus with diabetic polyneuropathy, with long-term current use of insulin (HCC)   GERD (gastroesophageal reflux disease)   Hypertension   Obesity (BMI 30-39.9)   Constipation   History of hypothyroidism   Major depression  Total Time spent with patient: 30 minutes  Past Psychiatric History: Patient has a history of depression and depressive symptoms going back several years with only intermittent treatment and poor response.  Past Medical History:  Past Medical History:  Diagnosis Date  . Acute acalculous cholecystitis s/p lap chole 04/03/2014 04/02/2014  . Allergy   . Anemia   . Anxiety   . Asthma   . Depression   . Fatty liver    noted on CT per [facility] records  . Fibromyalgia   . GERD (gastroesophageal reflux disease)   . Hyperlipidemia associated with type 2 diabetes mellitus ([place])   . Hypertension   . Hypothyroid 2015   Transiently in 2015. Synthroid 72mg was stopped 05/07/2016 w/ tsh nml following.  . Obesity (BMI 30-39.9)   . OSA (obstructive sleep apnea)    non-compliant with CPAP  . Seborrheic dermatitis    on face and scalp[facility]  . Stroke ([facility]    TIA  . Type 2 diabetes mellitus ([place]     Past Surgical History:  Procedure Laterality Date  . BREAST REDUCTION SURGERY  06/2008  . CHOLECYSTECTOMY N/A 04/03/2014   Procedure: LAPAROSCOPIC CHOLECYSTECTOMY ;  Surgeon: SAdin Hector MD;  Location: [facility] ORS;  Service: General;  Laterality: N/A;  . REDUCTION MAMMAPLASTY    . TUBAL LIGATION     Family History:  Family History  Problem Relation Age of Onset  . Atrial fibrillation Mother   . Heart disease Father        CAD  . Diabetes Brother   . Cancer Maternal Grandmother   . Diabetes Paternal Grandmother   . Fibromyalgia Sister   . Colitis Sister    Family Psychiatric  History: Seems uncertain.  Possibly some depression and anxiety. Social History:  Social History   Substance and Sexual Activity  Alcohol Use No     Social History   Substance and Sexual Activity  Drug Use No    Social  History   Socioeconomic History  . Marital status: Divorced    Spouse name: Not on file  . Number of children: Not on file  . Years of education: Not on file  . Highest education level: Not on file  Occupational History  . Not on file  Social Needs  . Financial resource strain: Not on file  . Food insecurity    Worry: Not on file    Inability: Not on file  . Transportation needs    Medical: Not on file    Non-medical: Not on file  Tobacco Use  . Smoking status: Former Smoker    Packs/day: 4.00    Years: 15.00    Pack years: 60.00    Types: Cigarettes    Quit date:  04/22/2004    Years since quitting: 14.9  . Smokeless tobacco: Never Used  . Tobacco comment: 4 pack year hx  Substance and Sexual Activity  . Alcohol use: No  . Drug use: No  . Sexual activity: Never  Lifestyle  . Physical activity    Days per week: Not on file    Minutes per session: Not on file  . Stress: Not on file  Relationships  . Social Herbalist on phone: Not on file    Gets together: Not on file    Attends religious service: Not on file    Active member of club or organization: Not on file    Attends meetings of clubs or organizations: Not on file    Relationship status: Not on file  Other Topics Concern  . Not on file  Social History Narrative  . Not on file   Additional Social History:                         Sleep: Fair  Appetite:  Fair  Current Medications: Current Facility-Administered Medications  Medication Dose Route Frequency Provider Last Rate Last Dose  . 0.9 %  sodium chloride infusion  500 mL Intravenous Once Marija Calamari T, MD      . acetaminophen (TYLENOL) tablet 650 mg  650 mg Oral Q6H PRN Abir Craine, Madie Reno, MD   650 mg at 03/22/19 1757  . alum & mag hydroxide-simeth (MAALOX/MYLANTA) 200-200-20 MG/5ML suspension 30 mL  30 mL Oral Q4H PRN Rozella Servello T, MD      . aspirin EC tablet 81 mg  81 mg Oral Daily Avalie Oconnor, Madie Reno, MD   81 mg at 03/23/19 1247  . atorvastatin (LIPITOR) tablet 80 mg  80 mg Oral q1800 Dodge Ator, Madie Reno, MD   80 mg at 03/22/19 1758  . docusate sodium (COLACE) capsule 100 mg  100 mg Oral BID Tiffnay Bossi, Madie Reno, MD   100 mg at 03/23/19 1251  . DULoxetine (CYMBALTA) DR capsule 30 mg  30 mg Oral Daily Tyreek Clabo, Madie Reno, MD   30 mg at 03/23/19 1247  . feeding supplement (ENSURE ENLIVE) (ENSURE ENLIVE) liquid 237 mL  237 mL Oral BID BM Loree Shehata, Madie Reno, MD   237 mL at 03/19/19 1938  . glycopyrrolate (ROBINUL) injection 0.2 mg  0.2 mg Intravenous Once Deontaye Civello T, MD      . hydrocortisone cream 1 %   Topical BID PRN  Everhett Bozard, Madie Reno, MD   1 application at 16/10/96 1601  . hydrOXYzine (ATARAX/VISTARIL) tablet 50 mg  50 mg Oral Q6H PRN Saxon Crosby, Madie Reno, MD   50 mg at 03/20/19 1129  .  insulin aspart (novoLOG) injection 0-15 Units  0-15 Units Subcutaneous TID WC Kloie Whiting, Madie Reno, MD   3 Units at 03/23/19 1153  . irbesartan (AVAPRO) tablet 150 mg  150 mg Oral Daily Armonie Staten, Madie Reno, MD   150 mg at 03/23/19 1248  . ketorolac (TORADOL) 30 MG/ML injection 30 mg  30 mg Intravenous Once Mamoudou Mulvehill T, MD      . ketorolac (TORADOL) 30 MG/ML injection 30 mg  30 mg Intravenous Once Mireille Lacombe T, MD      . LORazepam (ATIVAN) tablet 1 mg  1 mg Oral Q8H PRN Bladen Umar, Madie Reno, MD   1 mg at 03/19/19 1838  . LORazepam (ATIVAN) tablet 1.5 mg  1.5 mg Oral BID Brindley Madarang, Madie Reno, MD   1.5 mg at 03/23/19 1305  . magnesium hydroxide (MILK OF MAGNESIA) suspension 30 mL  30 mL Oral Daily PRN Vence Lalor T, MD      . menthol-cetylpyridinium (CEPACOL) lozenge 3 mg  1 lozenge Oral PRN Maily Debarge T, MD      . metFORMIN (GLUCOPHAGE-XR) 24 hr tablet 1,000 mg  1,000 mg Oral BID WC Taris Galindo T, MD   1,000 mg at 03/23/19 1247  . midazolam (VERSED) injection 2 mg  2 mg Intravenous Once Miri Jose T, MD      . midazolam (VERSED) injection 2 mg  2 mg Intravenous Once Brinsley Wence T, MD      . multivitamin with minerals tablet 1 tablet  1 tablet Oral Daily Aurea Aronov, Madie Reno, MD   1 tablet at 03/23/19 1246  . ondansetron (ZOFRAN) injection 4 mg  4 mg Intravenous Once Earnstine Meinders T, MD      . ondansetron (ZOFRAN) injection 4 mg  4 mg Intravenous Once Blakely Gluth T, MD      . pantoprazole (PROTONIX) EC tablet 40 mg  40 mg Oral BH-q7a Trudy Kory, Madie Reno, MD   40 mg at 03/23/19 1247  . thiamine (VITAMIN B-1) tablet 100 mg  100 mg Oral Daily Neta Upadhyay, Madie Reno, MD   100 mg at 03/23/19 1247    Lab Results:  Results for orders placed or performed during the hospital encounter of 03/15/19 (from the past 48 hour(s))  Glucose, capillary     Status:  Abnormal   Collection Time: 03/21/19  4:25 PM  Result Value Ref Range   Glucose-Capillary 219 (H) 70 - 99 mg/dL  Glucose, capillary     Status: Abnormal   Collection Time: 03/22/19  7:00 AM  Result Value Ref Range   Glucose-Capillary 163 (H) 70 - 99 mg/dL   Comment 1 Notify RN   Glucose, capillary     Status: Abnormal   Collection Time: 03/22/19 11:44 AM  Result Value Ref Range   Glucose-Capillary 153 (H) 70 - 99 mg/dL   Comment 1 Notify RN   Glucose, capillary     Status: Abnormal   Collection Time: 03/22/19  4:12 PM  Result Value Ref Range   Glucose-Capillary 114 (H) 70 - 99 mg/dL   Comment 1 Notify RN   Glucose, capillary     Status: Abnormal   Collection Time: 03/22/19  9:00 PM  Result Value Ref Range   Glucose-Capillary 126 (H) 70 - 99 mg/dL   Comment 1 Notify RN   Glucose, capillary     Status: Abnormal   Collection Time: 03/23/19  6:47 AM  Result Value Ref Range   Glucose-Capillary 171 (H) 70 - 99 mg/dL   Comment 1  Notify RN   Glucose, capillary     Status: Abnormal   Collection Time: 03/23/19 11:33 AM  Result Value Ref Range   Glucose-Capillary 159 (H) 70 - 99 mg/dL   Comment 1 Notify RN     Blood Alcohol level:  Lab Results  Component Value Date   ETH <10 67/61/9509    Metabolic Disorder Labs: Lab Results  Component Value Date   HGBA1C 6.3 (H) 03/09/2019   MPG 134.11 03/09/2019   MPG 180 (H) 04/03/2014   No results found for: PROLACTIN Lab Results  Component Value Date   CHOL 174 10/09/2018   TRIG 210 (H) 10/09/2018   HDL 44 10/09/2018   CHOLHDL 4.0 10/09/2018   LDLCALC 88 10/09/2018   LDLCALC 77 06/28/2018    Physical Findings: AIMS:  , ,  ,  ,    CIWA:    COWS:     Musculoskeletal: Strength & Muscle Tone: within normal limits Gait & Station: normal Patient leans: N/A  Psychiatric Specialty Exam: Physical Exam  Nursing note and vitals reviewed. Constitutional: She appears well-developed and well-nourished.  HENT:  Head:  Normocephalic and atraumatic.  Eyes: Pupils are equal, round, and reactive to light. Conjunctivae are normal.  Neck: Normal range of motion.  Cardiovascular: Normal heart sounds.  Respiratory: Effort normal.  GI: Soft.  Musculoskeletal: Normal range of motion.  Neurological: She is alert.  Skin: Skin is warm and dry.  Psychiatric: Her mood appears anxious. Her speech is delayed. She is slowed. Cognition and memory are impaired. She expresses impulsivity. She expresses no suicidal ideation.    Review of Systems  Constitutional: Negative.   HENT: Negative.   Eyes: Negative.   Respiratory: Negative.   Cardiovascular: Negative.   Gastrointestinal: Negative.   Musculoskeletal: Negative.   Skin: Negative.   Neurological: Negative.   Psychiatric/Behavioral: Positive for depression and memory loss. Negative for hallucinations, substance abuse and suicidal ideas. The patient is nervous/anxious.     Blood pressure (!) 150/73, pulse 81, temperature (!) 97.5 F (36.4 C), resp. rate 19, height 5' 4"  (1.626 m), weight 72.6 kg, SpO2 94 %.Body mass index is 27.46 kg/m.  General Appearance: Casual  Eye Contact:  Fair  Speech:  Clear and Coherent  Volume:  Decreased  Mood:  Dysphoric  Affect:  Constricted  Thought Process:  Coherent  Orientation:  Full (Time, Place, and Person)  Thought Content:  Rumination and Tangential  Suicidal Thoughts:  Yes.  without intent/plan  Homicidal Thoughts:  No  Memory:  Immediate;   Fair Recent;   Poor Remote;   Fair  Judgement:  Fair  Insight:  Fair  Psychomotor Activity:  Normal  Concentration:  Concentration: Fair  Recall:  [employer] of Knowledge:  Fair  Language:  Fair  Akathisia:  No  Handed:  Right  AIMS (if indicated):     Assets:  Desire for Improvement Resilience Social Support  ADL's:  Impaired  Cognition:  Impaired,  Mild  Sleep:  Number of Hours: 7.15     Treatment Plan Summary: Daily contact with patient to assess and evaluate  symptoms and progress in treatment, Medication management and Plan Patient with severe anxiety and depression who is showing slow but real progress with ECT.  Medication wise we have been keeping things pretty stable.  Attempts to further taper the Ativan mostly just made her more nervous and did not serve any real purpose.  I had tried making a plan to cross taper antidepressants but she  got so anxious around that that I have put it on hold while waiting for the ECT to work.  No change to medication for today.  Next ECT treatment Monday.  Alethia Berthold, MD 03/23/2019, 4:11 PM

## 2019-03-23 NOTE — Plan of Care (Signed)
Pt cannot rate depression or anxiety. She states that it is "better" and "can't give a rating.  Problem: Education: Goal: Utilization of techniques to improve thought processes will improve 03/23/2019 1742 by Kieth Brightly, RN Outcome: Progressing 03/23/2019 1032 by Kieth Brightly, RN Outcome: Progressing Goal: Knowledge of the prescribed therapeutic regimen will improve 03/23/2019 1742 by Kieth Brightly, RN Outcome: Progressing 03/23/2019 1032 by Kieth Brightly, RN Outcome: Progressing   Problem: Activity: Goal: Interest or engagement in leisure activities will improve 03/23/2019 1742 by Kieth Brightly, RN Outcome: Progressing 03/23/2019 1032 by Kieth Brightly, RN Outcome: Progressing Goal: Imbalance in normal sleep/wake cycle will improve 03/23/2019 1742 by Kieth Brightly, RN Outcome: Progressing 03/23/2019 1032 by Kieth Brightly, RN Outcome: Progressing   Problem: Coping: Goal: Coping ability will improve 03/23/2019 1742 by Kieth Brightly, RN Outcome: Progressing 03/23/2019 1032 by Kieth Brightly, RN Outcome: Progressing Goal: Will verbalize feelings 03/23/2019 1742 by Kieth Brightly, RN Outcome: Progressing 03/23/2019 1032 by Kieth Brightly, RN Outcome: Progressing   Problem: Health Behavior/Discharge Planning: Goal: Ability to make decisions will improve 03/23/2019 1742 by Kieth Brightly, RN Outcome: Progressing 03/23/2019 1032 by Kieth Brightly, RN Outcome: Progressing Goal: Compliance with therapeutic regimen will improve 03/23/2019 1742 by Kieth Brightly, RN Outcome: Progressing 03/23/2019 1032 by Kieth Brightly, RN Outcome: Progressing   Problem: Role Relationship: Goal: Will demonstrate positive changes in social behaviors and relationships 03/23/2019 1742 by Kieth Brightly, RN Outcome: Progressing 03/23/2019 1032 by Kieth Brightly, RN Outcome: Progressing   Problem: Safety: Goal: Ability to disclose and discuss suicidal ideas will  improve 03/23/2019 1742 by Kieth Brightly, RN Outcome: Progressing 03/23/2019 1032 by Kieth Brightly, RN Outcome: Progressing Goal: Ability to identify and utilize support systems that promote safety will improve 03/23/2019 1742 by Kieth Brightly, RN Outcome: Progressing 03/23/2019 1032 by Kieth Brightly, RN Outcome: Progressing   Problem: Self-Concept: Goal: Will verbalize positive feelings about self 03/23/2019 1742 by Kieth Brightly, RN Outcome: Progressing 03/23/2019 1032 by Kieth Brightly, RN Outcome: Progressing Goal: Level of anxiety will decrease 03/23/2019 1742 by Kieth Brightly, RN Outcome: Progressing 03/23/2019 1032 by Kieth Brightly, RN Outcome: Progressing   Problem: Education: Goal: Knowledge of [place] Education information/materials will improve 03/23/2019 1742 by Kieth Brightly, RN Outcome: Progressing 03/23/2019 1032 by Kieth Brightly, RN Outcome: Progressing Goal: Emotional status will improve 03/23/2019 1742 by Kieth Brightly, RN Outcome: Progressing 03/23/2019 1032 by Kieth Brightly, RN Outcome: Progressing Goal: Mental status will improve 03/23/2019 1742 by Kieth Brightly, RN Outcome: Progressing 03/23/2019 1032 by Kieth Brightly, RN Outcome: Progressing Goal: Verbalization of understanding the information provided will improve 03/23/2019 1742 by Kieth Brightly, RN Outcome: Progressing 03/23/2019 1032 by Kieth Brightly, RN Outcome: Progressing   Problem: Self-Concept: Goal: Level of anxiety will decrease 03/23/2019 1742 by Kieth Brightly, RN Outcome: Progressing 03/23/2019 1032 by Kieth Brightly, RN Outcome: Progressing

## 2019-03-23 NOTE — Procedures (Signed)
ECT SERVICES Physician's Interval Evaluation & Treatment Note  Patient Identification: Jeanette Rose MRN:  [id number] Date of Evaluation:  03/23/2019 TX #: 4  MADRS:   MMSE:   P.E. Findings:  No change to physical exam  Psychiatric Interval Note:  Subjectively slightly better objectively seems much calmer  Subjective:  Patient is a 63 y.o. female seen for evaluation for Electroconvulsive Therapy. Some improvement in depression more hopeful  Treatment Summary:   [x]   Right Unilateral             []  Bilateral   % Energy : 0.3 ms 60%   Impedance: 2380 ohms  Seizure Energy Index: 1621 V squared  Postictal Suppression Index: 25%  Seizure Concordance Index: 88%  Medications  Pre Shock: Robinul 0.1 mg Zofran 4 mg Brevital 80 mg succinylcholine 100 mg Toradol 30 mg  Post Shock: Versed 2 mg  Seizure Duration: 20 seconds EMG 60 seconds EEG   Comments: Follow-up Monday  Lungs:  [x]   Clear to auscultation               []  Other:   Heart:    [x]   Regular rhythm             []  irregular rhythm    [x]   Previous H&P reviewed, patient examined and there are NO CHANGES                 []   Previous H&P reviewed, patient examined and there are changes noted.   Alethia Berthold, MD 6/26/202010:24 AM

## 2019-03-23 NOTE — Anesthesia Preprocedure Evaluation (Signed)
Anesthesia Evaluation  Patient identified by MRN, date of birth, ID band Patient awake    Reviewed: Allergy & Precautions, NPO status , Patient's Chart, lab work & pertinent test results, reviewed documented beta blocker date and time   Airway Mallampati: III  TM Distance: >3 FB     Dental  (+) Chipped   Pulmonary asthma , sleep apnea , former smoker,           Cardiovascular hypertension, Pt. on medications      Neuro/Psych PSYCHIATRIC DISORDERS Anxiety Depression  Neuromuscular disease    GI/Hepatic GERD  ,(+) Hepatitis -  Endo/Other  diabetes, Type 2Hypothyroidism   Renal/GU      Musculoskeletal  (+) Fibromyalgia -  Abdominal   Peds  Hematology  (+) anemia ,   Anesthesia Other Findings   Reproductive/Obstetrics                             Anesthesia Physical Anesthesia Plan  ASA: III  Anesthesia Plan: General   Post-op Pain Management:    Induction: Intravenous  PONV Risk Score and Plan:   Airway Management Planned:   Additional Equipment:   Intra-op Plan:   Post-operative Plan:   Informed Consent: I have reviewed the patients History and Physical, chart, labs and discussed the procedure including the risks, benefits and alternatives for the proposed anesthesia with the patient or authorized representative who has indicated his/her understanding and acceptance.       Plan Discussed with: CRNA  Anesthesia Plan Comments:         Anesthesia Quick Evaluation

## 2019-03-24 MED ORDER — FLEET ENEMA 7-19 GM/118ML RE ENEM: 1.0000 | ENEMA | Freq: Every day | RECTAL | Status: DC | PRN

## 2019-03-24 MED ORDER — POLYETHYLENE GLYCOL 3350 17 G PO PACK
17.0000 g | PACK | Freq: Every day | ORAL | Status: DC
  Administered 2019-03-24: 17 g via ORAL
  Filled 2019-03-24 (×2): qty 1

## 2019-03-24 MED ORDER — BUSPIRONE HCL 5 MG PO TABS
10.0000 mg | ORAL_TABLET | Freq: Three times a day (TID) | ORAL | Status: DC
  Administered 2019-03-24 – 2019-03-25 (×3): 10 mg via ORAL
  Filled 2019-03-24 (×3): qty 2

## 2019-03-24 NOTE — BHH Counselor (Signed)
Patient had question if [facility] could pay her hospital bill. CSW informed her that she did not think so, however, she would ask.   CSW has emailed someone at [employer].  Assunta Curtis, MSW, LCSW 03/24/2019 1:50 PM

## 2019-03-24 NOTE — Plan of Care (Signed)
  Problem: Safety: Goal: Ability to disclose and discuss suicidal ideas will improve Outcome: Progressing  Patient denies suicidal ideation

## 2019-03-24 NOTE — BHH Counselor (Signed)
CSW spoke with [facility] in regards to the patient's question on support with hospital bills.  CSW informed that [facility] informed CSW that they do not cover hospital bills at this time.   Assunta Curtis, MSW, LCSW 03/24/2019 3:29 PM

## 2019-03-24 NOTE — BHH Counselor (Signed)
LCSW Group Therapy Note  03/24/2019  1:00 PM   Type of Therapy and Topic:  Group Therapy:  Trust and Honesty   Participation Level:  Did Not Attend   Description of Group:    In this group patients will be asked to explore the value of being honest.  Patients will be guided to discuss their thoughts, feelings, and behaviors related to honesty and trusting in others. Patients will process together how trust and honesty relate to forming relationships with peers, family members, and self. Each patient will be challenged to identify and express feelings of being vulnerable. Patients will discuss reasons why people are dishonest and identify alternative outcomes if one was truthful (to self or others). This group will be process-oriented, with patients participating in exploration of their own experiences, giving and receiving support, and processing challenge from other group members.   Therapeutic Goals: 1. Patient will identify why honesty is important to relationships and how honesty overall affects relationships.  2. Patient will identify a situation where they lied or were lied too and the  feelings, thought process, and behaviors surrounding the situation 3. Patient will identify the meaning of being vulnerable, how that feels, and how that correlates to being honest with self and others. 4. Patient will identify situations where they could have told the truth, but instead lied and explain reasons of dishonesty.   Summary of Patient Progress:  X    Therapeutic Modalities:   Cognitive Behavioral Therapy Solution Focused Therapy Motivational Interviewing Brief Therapy  Assunta Curtis, MSW, LCSW 03/24/2019 10:38 AM

## 2019-03-24 NOTE — Progress Notes (Addendum)
D: Patient denies SI/HI/AVH, alert and oriented x 4,she is  pleasant and cooperative, she  stated " I  feels better from doing ECT"  she appears less anxious and she is interacting with peers and staff appropriately.  A: Patient was offered support and encouragement, given scheduled medications, and was encouraged to attend groups. Q 15 minute checks were done for safety.  R:Patient attends evening wrap up groups and interacts well with peers and staff.  I Patient is receptive to treatment and safety maintained on unit.

## 2019-03-24 NOTE — Progress Notes (Signed)
[facility] MD Progress Note  03/24/2019 9:44 AM Jeanette Rose  MRN:  [id number] Subjective: Patient is a 63 year old female with a past psychiatric history significant for major depression and generalized anxiety disorder.  She was admitted on 03/18/2020 for ECT treatment.  Objective: Patient is seen and examined.  Patient is a 63 year old female with the above-stated past psychiatric history who is seen in follow-up.  Patient is familiar to me from hospitalization at [facility] behavioral health hospital prior to this admission.  She was transferred to this facility for treatment of ECT for her treatment resistant depression as well as generalized anxiety disorder.  Patient received her fourth treatment on 03/23/2019.  She stated she feels as though her mood is better, and then a slight improvement in generalized anxiety.  She stated that she wants to know about any medication for her anxiety that would be effective without being addictive.  During the course the hospitalization at [facility] she was tried on several medications to assist with her anxiety.  These included gabapentin, Trileptal, as well as nortriptyline.  These all have been stopped since she was transferred.  The patient has a propensity for finding side effects to medication and this leads to multiple trials without success.  She does continue on Cymbalta.  On admission at [facility] she was on 120 mg p.o. daily of Cymbalta.  This has been weaned down to 30 mg p.o. daily.  She also continues on hydroxyzine and lorazepam.  Her lorazepam standing dosage is currently 1 mg p.o. twice daily.  On admission she had been taking at least 2 mg p.o. 4 times daily.  With regards to her medical conditions, her blood sugar this a.m. is 159.  Her blood pressure is stable at 135/79, and she remains tachycardic.  She is afebrile.  She slept 6.15 hours last night.  She denied suicidal ideation this morning, but is slightly tremulous.  No new laboratories.  Her last EKG  was on 03/04/2019 and revealed sinus tachycardia.  Principal Problem: Severe recurrent major depression without psychotic features ([place]) Diagnosis: Principal Problem:   Severe recurrent major depression without psychotic features ([place]) Active Problems:   Type 2 diabetes mellitus with diabetic polyneuropathy, with long-term current use of insulin (HCC)   GERD (gastroesophageal reflux disease)   Hypertension   Obesity (BMI 30-39.9)   Constipation   History of hypothyroidism   Major depression  Total Time spent with patient: 20 minutes  Past Psychiatric History: See admission H&P  Past Medical History:  Past Medical History:  Diagnosis Date  . Acute acalculous cholecystitis s/p lap chole 04/03/2014 04/02/2014  . Allergy   . Anemia   . Anxiety   . Asthma   . Depression   . Fatty liver    noted on CT per [facility] records  . Fibromyalgia   . GERD (gastroesophageal reflux disease)   . Hyperlipidemia associated with type 2 diabetes mellitus ([place])   . Hypertension   . Hypothyroid 2015   Transiently in 2015. Synthroid 11mg was stopped 05/07/2016 w/ tsh nml following.  . Obesity (BMI 30-39.9)   . OSA (obstructive sleep apnea)    non-compliant with CPAP  . Seborrheic dermatitis    [facility] face and scalp[facility]  . St[facility] ([facility]    TIA  . Type 2 diabetes mellitus ([place]     Past Surgical History:  Procedure Laterality Date  . BREAST REDUCTION SURGERY  06/2008  . CHOLECYSTECTOMY N/A 04/03/2014   Procedure: LAPAROSCOPIC  CHOLECYSTECTOMY ;  Surgeon: Adin Hector, MD;  Location: [facility] ORS;  Service: General;  Laterality: N/A;  . REDUCTION MAMMAPLASTY    . TUBAL LIGATION     Family History:  Family History  Problem Relation Age of Onset  . Atrial fibrillation Mother   . Heart disease Father        CAD  . Diabetes Brother   . Cancer Maternal Grandmother   . Diabetes Paternal Grandmother   . Fibromyalgia Sister   . Colitis Sister    Family Psychiatric  History: See  admission H&P Social History:  Social History   Substance and Sexual Activity  Alcohol Use No     Social History   Substance and Sexual Activity  Drug Use No    Social History   Socioeconomic History  . Marital status: Divorced    Spouse name: Not on file  . Number of children: Not on file  . Years of education: Not on file  . Highest education level: Not on file  Occupational History  . Not on file  Social Needs  . Financial resource strain: Not on file  . Food insecurity    Worry: Not on file    Inability: Not on file  . Transportation needs    Medical: Not on file    Non-medical: Not on file  Tobacco Use  . Smoking status: Former Smoker    Packs/day: 4.00    Years: 15.00    Pack years: 60.00    Types: Cigarettes    Quit date: 04/22/2004    Years since quitting: 14.9  . Smokeless tobacco: Never Used  . Tobacco comment: 4 pack year hx  Substance and Sexual Activity  . Alcohol use: No  . Drug use: No  . Sexual activity: Never  Lifestyle  . Physical activity    Days per week: Not on file    Minutes per session: Not on file  . Stress: Not on file  Relationships  . Social Herbalist on phone: Not on file    Gets together: Not on file    Attends religious service: Not on file    Active member of club or organization: Not on file    Attends meetings of clubs or organizations: Not on file    Relationship status: Not on file  Other Topics Concern  . Not on file  Social History Narrative  . Not on file   Additional Social History:                         Sleep: Fair  Appetite:  Fair  Current Medications: Current Facility-Administered Medications  Medication Dose Route Frequency Provider Last Rate Last Dose  . 0.9 %  sodium chloride infusion  500 mL Intravenous Once Clapacs, John T, MD      . acetaminophen (TYLENOL) tablet 650 mg  650 mg Oral Q6H PRN Clapacs, Madie Reno, MD   650 mg at 03/23/19 2130  . alum & mag hydroxide-simeth  (MAALOX/MYLANTA) 200-200-20 MG/5ML suspension 30 mL  30 mL Oral Q4H PRN Clapacs, John T, MD      . aspirin EC tablet 81 mg  81 mg Oral Daily Clapacs, Madie Reno, MD   81 mg at 03/24/19 1610  . atorvastatin (LIPITOR) tablet 80 mg  80 mg Oral q1800 Clapacs, Madie Reno, MD   80 mg at 03/23/19 1654  . docusate sodium (COLACE) capsule 100 mg  100 mg  Oral BID Clapacs, Madie Reno, MD   100 mg at 03/24/19 0825  . DULoxetine (CYMBALTA) DR capsule 30 mg  30 mg Oral Daily Clapacs, Madie Reno, MD   30 mg at 03/24/19 0825  . feeding supplement (ENSURE ENLIVE) (ENSURE ENLIVE) liquid 237 mL  237 mL Oral BID BM Clapacs, Madie Reno, MD   237 mL at 03/23/19 2132  . glycopyrrolate (ROBINUL) injection 0.2 mg  0.2 mg Intravenous Once Clapacs, John T, MD      . hydrocortisone cream 1 %   Topical BID PRN Clapacs, Madie Reno, MD   1 application at 88/41/66 1601  . hydrOXYzine (ATARAX/VISTARIL) tablet 50 mg  50 mg Oral Q6H PRN Clapacs, Madie Reno, MD   50 mg at 03/20/19 1129  . insulin aspart (novoLOG) injection 0-15 Units  0-15 Units Subcutaneous TID WC Clapacs, Madie Reno, MD   3 Units at 03/24/19 0827  . irbesartan (AVAPRO) tablet 150 mg  150 mg Oral Daily Clapacs, Madie Reno, MD   150 mg at 03/24/19 0823  . ketorolac (TORADOL) 30 MG/ML injection 30 mg  30 mg Intravenous Once Clapacs, John T, MD      . ketorolac (TORADOL) 30 MG/ML injection 30 mg  30 mg Intravenous Once Clapacs, John T, MD      . LORazepam (ATIVAN) tablet 1 mg  1 mg Oral Q8H PRN Clapacs, Madie Reno, MD   1 mg at 03/23/19 2130  . LORazepam (ATIVAN) tablet 1 mg  1 mg Oral BID Clapacs, Madie Reno, MD   1 mg at 03/24/19 0825  . magnesium hydroxide (MILK OF MAGNESIA) suspension 30 mL  30 mL Oral Daily PRN Clapacs, John T, MD      . menthol-cetylpyridinium (CEPACOL) lozenge 3 mg  1 lozenge Oral PRN Clapacs, John T, MD      . metFORMIN (GLUCOPHAGE-XR) 24 hr tablet 1,000 mg  1,000 mg Oral BID WC Clapacs, Madie Reno, MD   1,000 mg at 03/24/19 0823  . midazolam (VERSED) injection 2 mg  2 mg Intravenous Once  Clapacs, John T, MD      . midazolam (VERSED) injection 2 mg  2 mg Intravenous Once Clapacs, John T, MD      . multivitamin with minerals tablet 1 tablet  1 tablet Oral Daily Clapacs, Madie Reno, MD   1 tablet at 03/24/19 0600  . ondansetron (ZOFRAN) injection 4 mg  4 mg Intravenous Once Clapacs, John T, MD      . ondansetron (ZOFRAN) injection 4 mg  4 mg Intravenous Once Clapacs, John T, MD      . pantoprazole (PROTONIX) EC tablet 40 mg  40 mg Oral BH-q7a Clapacs, Madie Reno, MD   40 mg at 03/24/19 0630  . thiamine (VITAMIN B-1) tablet 100 mg  100 mg Oral Daily Clapacs, Madie Reno, MD   100 mg at 03/24/19 0630    Lab Results:  Results for orders placed or performed during the hospital encounter of 03/15/19 (from the past 48 hour(s))  Glucose, capillary     Status: Abnormal   Collection Time: 03/22/19 11:44 AM  Result Value Ref Range   Glucose-Capillary 153 (H) 70 - 99 mg/dL   Comment 1 Notify RN   Glucose, capillary     Status: Abnormal   Collection Time: 03/22/19  4:12 PM  Result Value Ref Range   Glucose-Capillary 114 (H) 70 - 99 mg/dL   Comment 1 Notify RN   Glucose, capillary     Status: Abnormal  Collection Time: 03/22/19  9:00 PM  Result Value Ref Range   Glucose-Capillary 126 (H) 70 - 99 mg/dL   Comment 1 Notify RN   Glucose, capillary     Status: Abnormal   Collection Time: 03/23/19  6:47 AM  Result Value Ref Range   Glucose-Capillary 171 (H) 70 - 99 mg/dL   Comment 1 Notify RN   Glucose, capillary     Status: Abnormal   Collection Time: 03/23/19 11:33 AM  Result Value Ref Range   Glucose-Capillary 159 (H) 70 - 99 mg/dL   Comment 1 Notify RN     Blood Alcohol level:  Lab Results  Component Value Date   ETH <10 40/98/1191    Metabolic Disorder Labs: Lab Results  Component Value Date   HGBA1C 6.3 (H) 03/09/2019   MPG 134.11 03/09/2019   MPG 180 (H) 04/03/2014   No results found for: PROLACTIN Lab Results  Component Value Date   CHOL 174 10/09/2018   TRIG 210 (H)  10/09/2018   HDL 44 10/09/2018   CHOLHDL 4.0 10/09/2018   LDLCALC 88 10/09/2018   LDLCALC 77 06/28/2018    Physical Findings: AIMS:  , ,  ,  ,    CIWA:    COWS:     Musculoskeletal: Strength & Muscle Tone: within normal limits Gait & Station: normal Patient leans: N/A  Psychiatric Specialty Exam: Physical Exam  Nursing note and vitals reviewed. Constitutional: She is oriented to person, place, and time. She appears well-developed and well-nourished.  HENT:  Head: Normocephalic and atraumatic.  Respiratory: Effort normal.  Neurological: She is alert and oriented to person, place, and time.    ROS  Blood pressure 135/79, pulse (!) 110, temperature 98 F (36.7 C), temperature source Oral, resp. rate 17, height 5' 4"  (1.626 m), weight 72.6 kg, SpO2 98 %.Body mass index is 27.46 kg/m.  General Appearance: Casual  Eye Contact:  Fair  Speech:  Pressured  Volume:  Normal  Mood:  Anxious and Depressed  Affect:  Congruent  Thought Process:  Coherent and Descriptions of Associations: Intact  Orientation:  Full (Time, Place, and Person)  Thought Content:  Logical  Suicidal Thoughts:  No  Homicidal Thoughts:  No  Memory:  Immediate;   Fair Recent;   Fair Remote;   Fair  Judgement:  Intact  Insight:  Lacking  Psychomotor Activity:  Increased  Concentration:  Concentration: Fair and Attention Span: Fair  Recall:  [employer] of Knowledge:  Fair  Language:  Fair  Akathisia:  Negative  Handed:  Right  AIMS (if indicated):     Assets:  Desire for Improvement Resilience  ADL's:  Intact  Cognition:  WNL  Sleep:  Number of Hours: 6.15     Treatment Plan Summary: Daily contact with patient to assess and evaluate symptoms and progress in treatment, Medication management and Plan : Patient is seen and examined.  Patient is a 63 year old female with the above-stated past psychiatric history who is seen in follow-up.   Diagnosis: #1 major depression, recurrent, severe without  psychotic features, #2 generalized anxiety disorder, #3 posttraumatic stress disorder.  Patient is seen and examined.  Patient is a 62 year old female with the above-stated past psychiatric history is seen in follow-up.  Her mood is better than it was when I last saw her at [facility].  She has received 4 ECT treatments.  Hopefully things will continue to improve from that standpoint.  She continues on Cymbalta but  is at 30 mg p.o. daily.  She has been on multiple medications in the past, and none have been really truly effective.  While at the [facility] behavioral health hospital we attempted to help her mood with mirtazapine, nortriptyline, Trileptal, gabapentin.  Between her mood and anxiety it was difficult to control, and the decision was made for her to receive ECT.  Clearly her mood is improved, but she remains quite anxious.  She is requesting additional anxiety medicine outside of the lorazepam today.  We discussed her options and medicines that she had failed previously.  She did agree to restart BuSpar, and we will start that at 10 mg p.o. 3 times daily, and titrate that during the course the hospitalization.  Hopefully as her ECT treatment continues things will continue to improve.  Her vital signs are generally stable outside of her tachycardia, she is afebrile.  Her blood sugar is well controlled. 1.  Continue ECT, treatment #5 scheduled for 03/26/2019. 2.  Continue Cymbalta 30 mg p.o. daily for depression and anxiety.  This was previously at 120 mg p.o. daily, and most likely will be weaned during the course of treatment. 3.  Continue lorazepam 1 mg p.o. twice daily as well as 1 mg p.o. every 8 hours as needed anxiety.  This also will be weaned during the course the hospitalization. 4.  Continue Metformin XR thousand milligrams p.o. twice daily with meals for diabetes mellitus type 2. 5.  Start BuSpar 10 mg p.o. 3 times daily and titrate. 6.  Continue Avapro 150 mg  p.o. daily for hypertension. 7.  Continue thiamine 100 mg p.o. daily for nutritional supplementation. 8.  Continue Protonix 40 mg p.o. daily for GERD symptoms. 9.  Continue sliding scale insulin as written for diabetes mellitus type 2. 10.  Disposition planning-in progress.  Sharma Covert, MD 03/24/2019, 9:44 AM

## 2019-03-24 NOTE — Plan of Care (Signed)
Pt denies depression, anxiety, SI, HI and AVH. She did put on self inventory that she was "nervous". Pt was educated on care plan and verbalizes understanding. Collier Bullock RN Problem: Education: Goal: Utilization of techniques to improve thought processes will improve Outcome: Progressing Goal: Knowledge of the prescribed therapeutic regimen will improve Outcome: Progressing   Problem: Activity: Goal: Interest or engagement in leisure activities will improve Outcome: Progressing Goal: Imbalance in normal sleep/wake cycle will improve Outcome: Progressing   Problem: Coping: Goal: Coping ability will improve Outcome: Progressing Goal: Will verbalize feelings Outcome: Progressing   Problem: Health Behavior/Discharge Planning: Goal: Ability to make decisions will improve Outcome: Progressing Goal: Compliance with therapeutic regimen will improve Outcome: Progressing   Problem: Role Relationship: Goal: Will demonstrate positive changes in social behaviors and relationships Outcome: Progressing   Problem: Safety: Goal: Ability to disclose and discuss suicidal ideas will improve Outcome: Progressing Goal: Ability to identify and utilize support systems that promote safety will improve Outcome: Progressing   Problem: Self-Concept: Goal: Will verbalize positive feelings about self Outcome: Progressing Goal: Level of anxiety will decrease Outcome: Progressing   Problem: Education: Goal: Knowledge of [place] [facility] Education information/materials will improve Outcome: Progressing Goal: Emotional status will improve Outcome: Progressing Goal: Mental status will improve Outcome: Progressing Goal: Verbalization of understanding the information provided will improve Outcome: Progressing   Problem: Self-Concept: Goal: Level of anxiety will decrease Outcome: Progressing

## 2019-03-25 MED ORDER — POLYETHYLENE GLYCOL 3350 17 G PO PACK
17.0000 g | PACK | Freq: Every day | ORAL | Status: DC
  Administered 2019-03-28 – 2019-03-29 (×2): 17 g via ORAL
  Filled 2019-03-25 (×2): qty 1

## 2019-03-25 MED ORDER — BISACODYL 10 MG RE SUPP
10.0000 mg | Freq: Once | RECTAL | Status: AC
  Administered 2019-03-25: 10 mg via RECTAL
  Filled 2019-03-25: qty 1

## 2019-03-25 MED ORDER — FLEET ENEMA 7-19 GM/118ML RE ENEM
1.0000 | ENEMA | Freq: Once | RECTAL | Status: AC
  Administered 2019-03-25: 1 via RECTAL

## 2019-03-25 MED ORDER — BUSPIRONE HCL 5 MG PO TABS
15.0000 mg | ORAL_TABLET | Freq: Three times a day (TID) | ORAL | Status: DC
  Administered 2019-03-25 – 2019-03-30 (×13): 15 mg via ORAL
  Filled 2019-03-25 (×13): qty 3

## 2019-03-25 MED ORDER — SENNOSIDES-DOCUSATE SODIUM 8.6-50 MG PO TABS
1.0000 | ORAL_TABLET | Freq: Every day | ORAL | Status: DC
  Administered 2019-03-25 – 2019-03-29 (×5): 1 via ORAL
  Filled 2019-03-25 (×6): qty 1

## 2019-03-25 NOTE — Plan of Care (Signed)
  Problem: Education: Goal: Utilization of techniques to improve thought processes will improve Outcome: Progressing Goal: Knowledge of the prescribed therapeutic regimen will improve Outcome: Progressing  D: Patient has been quiet and cooperative. Not as somatically focused. No complaints of constipation. Denies SI, HI and AVH. Mood is sad. Affect is flat. A: Continue to monitor for safety. R: Safety maintained.

## 2019-03-25 NOTE — BHH Group Notes (Signed)
LCSW Group Therapy Note  03/25/2019 11:25 AM   Type of Therapy and Topic: Group Therapy: Feelings Around Returning Home & Establishing a Supportive Framework and Supporting Oneself When Supports Not Available   Participation Level:  Did Not Attend   Description of Group:  Patients first processed thoughts and feelings about upcoming discharge. These included fears of upcoming changes, lack of change, new living environments, judgements and expectations from others and overall stigma of mental health issues. The group then discussed the definition of a supportive framework, what that looks and feels like, and how do to discern it from an unhealthy non-supportive network. The group identified different types of supports as well as what to do when your family/friends are less than helpful or unavailable   Therapeutic Goals  1. Patient will identify one healthy supportive network that they can use at discharge. 2. Patient will identify one factor of a supportive framework and how to tell it from an unhealthy network. 3. Patient able to identify one coping skill to use when they do not have positive supports from others. 4. Patient will demonstrate ability to communicate their needs through discussion and/or role plays.   Summary of Patient Progress:  x    Therapeutic Modalities Cognitive Behavioral Therapy Motivational Interviewing    Jeanette Rose, MSW, LCSW Clinical Social Work 03/25/2019 11:25 AM

## 2019-03-25 NOTE — Progress Notes (Signed)
[facility] MD Progress Note  03/25/2019 9:06 AM Jeanette Rose  MRN:  [id number] Subjective:  Patient is a 63 year old female with a past psychiatric history significant for major depression and generalized anxiety disorder.  She was admitted on 03/18/2020 for ECT treatment.  Objective: Patient is seen and examined.  Patient is a 63 year old female with the above-stated past psychiatric history who is seen in follow-up.  Patient stated she remains somewhat depressed and anxious.  She stated that the BuSpar had not had a great effect on her anxiety.  We discussed increasing that dosage.  She also stated that she continued to have issues with constipation.  I explained to her that she has been written for the daily MiraLAX and that would often take a couple of days to kick in.  She stated she had not received a fleets enema yesterday, and I wrote in the chart to give her a dose of that this a.m.  I also added Peri-Colace at at bedtime to help with that.  She is scheduled for her fifth ECT treatment tomorrow.  She denied any suicidal ideation.  Her mood is improving, but she complains of "I am not able to cry anymore".  We discussed the fact that that might reflect improvement in her mood.  Her vital signs are stable, she is afebrile.  She slept 7.5 hours last night.  Principal Problem: Severe recurrent major depression without psychotic features ([place]) Diagnosis: Principal Problem:   Severe recurrent major depression without psychotic features ([place]) Active Problems:   Type 2 diabetes mellitus with diabetic polyneuropathy, with long-term current use of insulin (HCC)   GERD (gastroesophageal reflux disease)   Hypertension   Obesity (BMI 30-39.9)   Constipation   History of hypothyroidism   Major depression  Total Time spent with patient: 15 minutes  Past Psychiatric History: See admission H&P  Past Medical History:  Past Medical History:  Diagnosis Date  . Acute acalculous cholecystitis s/p lap chole  04/03/2014 04/02/2014  . Allergy   . Anemia   . Anxiety   . Asthma   . Depression   . Fatty liver    noted on CT per [facility] records  . Fibromyalgia   . GERD (gastroesophageal reflux disease)   . Hyperlipidemia associated with type 2 diabetes mellitus ([place])   . Hypertension   . Hypothyroid 2015   Transiently in 2015. Synthroid 108mg was stopped 05/07/2016 w/ tsh nml following.  . Obesity (BMI 30-39.9)   . OSA (obstructive sleep apnea)    non-compliant with CPAP  . Seborrheic dermatitis    [facility]  . Stroke ([facility]    TIA  . Type 2 diabetes mellitus ([place]     Past Surgical History:  Procedure Laterality Date  . BREAST REDUCTION SURGERY  06/2008  . CHOLECYSTECTOMY N/A 04/03/2014   Procedure: LAPAROSCOPIC CHOLECYSTECTOMY ;  Surgeon: SAdin Hector MD;  Location: [facility] ORS;  Service: General;  Laterality: N/A;  . REDUCTION MAMMAPLASTY    . TUBAL LIGATION     Family History:  Family History  Problem Relation Age of Onset  . Atrial fibrillation Mother   . Heart disease Father        CAD  . Diabetes Brother   . Cancer Maternal Grandmother   . Diabetes Paternal Grandmother   . Fibromyalgia Sister   . Colitis Sister    Family Psychiatric  History: See admission H&P Social History:  Social History   Substance and Sexual Activity  Alcohol Use No     Social History   Substance and Sexual Activity  Drug Use No    Social History   Socioeconomic History  . Marital status: Divorced    Spouse name: Not on file  . Number of children: Not on file  . Years of education: Not on file  . Highest education level: Not on file  Occupational History  . Not on file  Social Needs  . Financial resource strain: Not on file  . Food insecurity    Worry: Not on file    Inability: Not on file  . Transportation needs    Medical: Not on file    Non-medical: Not on file  Tobacco Use  . Smoking status: Former Smoker    Packs/day: 4.00    Years: 15.00     Pack years: 60.00    Types: Cigarettes    Quit date: 04/22/2004    Years since quitting: 14.9  . Smokeless tobacco: Never Used  . Tobacco comment: 4 pack year hx  Substance and Sexual Activity  . Alcohol use: No  . Drug use: No  . Sexual activity: Never  Lifestyle  . Physical activity    Days per week: Not on file    Minutes per session: Not on file  . Stress: Not on file  Relationships  . Social Herbalist on phone: Not on file    Gets together: Not on file    Attends religious service: Not on file    Active member of club or organization: Not on file    Attends meetings of clubs or organizations: Not on file    Relationship status: Not on file  Other Topics Concern  . Not on file  Social History Narrative  . Not on file   Additional Social History:                         Sleep: Good  Appetite:  Fair  Current Medications: Current Facility-Administered Medications  Medication Dose Route Frequency Provider Last Rate Last Dose  . 0.9 %  sodium chloride infusion  500 mL Intravenous Once Clapacs, John T, MD      . acetaminophen (TYLENOL) tablet 650 mg  650 mg Oral Q6H PRN Clapacs, Madie Reno, MD   650 mg at 03/23/19 2130  . alum & mag hydroxide-simeth (MAALOX/MYLANTA) 200-200-20 MG/5ML suspension 30 mL  30 mL Oral Q4H PRN Clapacs, John T, MD      . aspirin EC tablet 81 mg  81 mg Oral Daily Clapacs, Madie Reno, MD   81 mg at 03/25/19 0814  . atorvastatin (LIPITOR) tablet 80 mg  80 mg Oral q1800 Clapacs, Madie Reno, MD   80 mg at 03/24/19 1650  . busPIRone (BUSPAR) tablet 15 mg  15 mg Oral TID Sharma Covert, MD      . docusate sodium (COLACE) capsule 100 mg  100 mg Oral BID Clapacs, Madie Reno, MD   100 mg at 03/25/19 0813  . DULoxetine (CYMBALTA) DR capsule 30 mg  30 mg Oral Daily Clapacs, Madie Reno, MD   30 mg at 03/25/19 0813  . feeding supplement (ENSURE ENLIVE) (ENSURE ENLIVE) liquid 237 mL  237 mL Oral BID BM Clapacs, John T, MD   237 mL at 03/24/19 1946  .  glycopyrrolate (ROBINUL) injection 0.2 mg  0.2 mg Intravenous Once Clapacs, John T, MD      . hydrocortisone cream 1 %  Topical BID PRN Clapacs, Madie Reno, MD   1 application at 35/36/14 1601  . hydrOXYzine (ATARAX/VISTARIL) tablet 50 mg  50 mg Oral Q6H PRN Clapacs, Madie Reno, MD   50 mg at 03/24/19 1305  . insulin aspart (novoLOG) injection 0-15 Units  0-15 Units Subcutaneous TID WC Clapacs, Madie Reno, MD   3 Units at 03/25/19 [phone]  . irbesartan (AVAPRO) tablet 150 mg  150 mg Oral Daily Clapacs, Madie Reno, MD   150 mg at 03/25/19 0814  . ketorolac (TORADOL) 30 MG/ML injection 30 mg  30 mg Intravenous Once Clapacs, John T, MD      . LORazepam (ATIVAN) tablet 1 mg  1 mg Oral Q8H PRN Clapacs, Madie Reno, MD   1 mg at 03/23/19 2130  . LORazepam (ATIVAN) tablet 1 mg  1 mg Oral BID Clapacs, Madie Reno, MD   1 mg at 03/25/19 0813  . magnesium hydroxide (MILK OF MAGNESIA) suspension 30 mL  30 mL Oral Daily PRN Clapacs, Madie Reno, MD   30 mL at 03/24/19 1337  . menthol-cetylpyridinium (CEPACOL) lozenge 3 mg  1 lozenge Oral PRN Clapacs, John T, MD      . metFORMIN (GLUCOPHAGE-XR) 24 hr tablet 1,000 mg  1,000 mg Oral BID WC Clapacs, John T, MD   1,000 mg at 03/25/19 4008  . midazolam (VERSED) injection 2 mg  2 mg Intravenous Once Clapacs, John T, MD      . midazolam (VERSED) injection 2 mg  2 mg Intravenous Once Clapacs, John T, MD      . multivitamin with minerals tablet 1 tablet  1 tablet Oral Daily Clapacs, Madie Reno, MD   1 tablet at 03/25/19 [phone]  . ondansetron (ZOFRAN) injection 4 mg  4 mg Intravenous Once Clapacs, John T, MD      . ondansetron (ZOFRAN) injection 4 mg  4 mg Intravenous Once Clapacs, John T, MD      . pantoprazole (PROTONIX) EC tablet 40 mg  40 mg Oral BH-q7a Clapacs, Madie Reno, MD   40 mg at 03/25/19 0630  . [START ON 03/26/2019] polyethylene glycol (MIRALAX / GLYCOLAX) packet 17 g  17 g Oral Daily Sharma Covert, MD      . senna-docusate (Senokot-S) tablet 1 tablet  1 tablet Oral QHS Sharma Covert, MD       . sodium phosphate (FLEET) 7-19 GM/118ML enema 1 enema  1 enema Rectal Daily PRN Sharma Covert, MD      . sodium phosphate (FLEET) 7-19 GM/118ML enema 1 enema  1 enema Rectal Daily PRN Sharma Covert, MD      . thiamine (VITAMIN B-1) tablet 100 mg  100 mg Oral Daily Clapacs, Madie Reno, MD   100 mg at 03/25/19 9509    Lab Results:  Results for orders placed or performed during the hospital encounter of 03/15/19 (from the past 48 hour(s))  Glucose, capillary     Status: Abnormal   Collection Time: 03/23/19 11:33 AM  Result Value Ref Range   Glucose-Capillary 159 (H) 70 - 99 mg/dL   Comment 1 Notify RN     Blood Alcohol level:  Lab Results  Component Value Date   ETH <10 32/67/1245    Metabolic Disorder Labs: Lab Results  Component Value Date   HGBA1C 6.3 (H) 03/09/2019   MPG 134.11 03/09/2019   MPG 180 (H) 04/03/2014   No results found for: PROLACTIN Lab Results  Component Value Date   CHOL 174 10/09/2018  TRIG 210 (H) 10/09/2018   HDL 44 10/09/2018   CHOLHDL 4.0 10/09/2018   LDLCALC 88 10/09/2018   LDLCALC 77 06/28/2018    Physical Findings: AIMS:  , ,  ,  ,    CIWA:    COWS:     Musculoskeletal: Strength & Muscle Tone: within normal limits Gait & Station: normal Patient leans: N/A  Psychiatric Specialty Exam: Physical Exam  Nursing note and vitals reviewed. Constitutional: She is oriented to person, place, and time. She appears well-developed and well-nourished.  HENT:  Head: Normocephalic and atraumatic.  Respiratory: Effort normal.  Neurological: She is alert and oriented to person, place, and time.    ROS  Blood pressure 140/76, pulse 99, temperature 98.4 F (36.9 C), temperature source Oral, resp. rate 18, height 5' 4"  (1.626 m), weight 72.6 kg, SpO2 98 %.Body mass index is 27.46 kg/m.  General Appearance: Casual  Eye Contact:  Fair  Speech:  Normal Rate  Volume:  Normal  Mood:  Anxious and Depressed  Affect:  Congruent  Thought  Process:  Coherent and Descriptions of Associations: Intact  Orientation:  Full (Time, Place, and Person)  Thought Content:  Logical  Suicidal Thoughts:  No  Homicidal Thoughts:  No  Memory:  Immediate;   Fair Recent;   Fair Remote;   Fair  Judgement:  Intact  Insight:  Fair  Psychomotor Activity:  Normal  Concentration:  Concentration: Fair and Attention Span: Fair  Recall:  [employer] of Knowledge:  Fair  Language:  Fair  Akathisia:  Negative  Handed:  Right  AIMS (if indicated):     Assets:  Desire for Improvement Resilience  ADL's:  Intact  Cognition:  WNL  Sleep:  Number of Hours: 7.5     Treatment Plan Summary: Daily contact with patient to assess and evaluate symptoms and progress in treatment, Medication management and Plan : Patient is a 63 year old female with the above-stated past psychiatric history who is seen in follow-up.   Diagnosis: #1 major depression, recurrent, severe without psychotic features, #2 generalized anxiety disorder, #3 posttraumatic stress disorder.  Patient is seen and examined.  She will receive her next ECT treatment tomorrow.  She does appear as though she is improving to a certain degree.  She continues to complain of anxiety.  I will increase her BuSpar to 15 mg p.o. 3 times daily to see if that has any benefit.  Her only other complaint this morning is constipation.  I have written for the fleets enema, the MiraLAX, and as well Peri-Colace.  Hopefully this will get her bowels moving.  Her blood sugar yesterday a.m. was 159.  No other changes in her medications.  Hopefully the affect of the ECT treatments will become more prominent as she continues. 1.  Continue ECT, treatment #5 scheduled for 03/26/2019. 2.  Continue Cymbalta 30 mg p.o. daily for depression and anxiety.  This was previously at 120 mg p.o. daily, and most likely will be weaned during the course of treatment. 3.  Continue lorazepam 1 mg p.o. twice daily as well as 1 mg p.o. every  8 hours as needed anxiety.  This also will be weaned during the course the hospitalization. 4.  Continue Metformin XR thousand milligrams p.o. twice daily with meals for diabetes mellitus type 2. 5.  Increase BuSpar to 15 mg p.o. 3 times daily and titrate for anxiety. 6.  Continue Avapro 150 mg p.o. daily for hypertension. 7.  Continue thiamine 100 mg p.o. daily  for nutritional supplementation. 8.  Continue Protonix 40 mg p.o. daily for GERD symptoms. 9.  Continue sliding scale insulin as written for diabetes mellitus type 2. 10.  MiraLAX p.o. daily for constipation. 11.  Fleets enema as needed daily for constipation. 12.  Dulcolax p.o. nightly for constipation. 13.  Disposition planning-in progress.  Sharma Covert, MD 03/25/2019, 9:06 AM

## 2019-03-25 NOTE — Progress Notes (Signed)
D: Patient has been quiet and cooperative. Not as somatically focused. No complaints of constipation. Denies SI, HI and AVH. Mood is sad. Affect is flat. A: Continue to monitor for safety. R: Safety maintained.

## 2019-03-25 NOTE — Progress Notes (Signed)
CSW spoke with pt about discharge plan and follow up providers. Pt reported that she has not made up her mind about follow up care due to Dr. [facility] not taking her insurance and her truck being messed up so not having transportation to get to [employer] if she were to switch to a provider in her network. Pt reported that she does not feel her medication is working and that she does not like ECT stating that "I do not like what it does to my memory". CSW informed pt that she would talk with Dr. Weber Cooks tomorrow and let him know how she feels about ECT and her current medication.   Jeanette Rose, MSW, LCSW Clinical Social Work 03/25/2019 1:32 PM

## 2019-03-25 NOTE — Plan of Care (Signed)
D- Patient alert and oriented. Patient presents in a pleasant mood on assessment gesturing that she slept "so-so" last night, but couldn't pinpoint as to why she didn't get an adequate night's rest. Patient has complaints of being constipated, in which she received interventions that she stated have not been helpful as of yet. Patient denies any signs/symptoms of depression/anxiety reporting that she is feeling "fair" overall. Patient also denies SI, HI, AVH, at this time. Patient had no stated goals for today.  A- Scheduled medications administered to patient, per MD orders. Support and encouragement provided.  Routine safety checks conducted every 15 minutes.  Patient informed to notify staff with problems or concerns.  R- No adverse drug reactions noted. Patient contracts for safety at this time. Patient compliant with medications and treatment plan. Patient receptive, calm, and cooperative. Patient interacts well with others on the unit.  Patient remains safe at this time.  Problem: Education: Goal: Utilization of techniques to improve thought processes will improve Outcome: Progressing Goal: Knowledge of the prescribed therapeutic regimen will improve Outcome: Progressing   Problem: Activity: Goal: Interest or engagement in leisure activities will improve Outcome: Progressing Goal: Imbalance in normal sleep/wake cycle will improve Outcome: Progressing   Problem: Coping: Goal: Coping ability will improve Outcome: Progressing Goal: Will verbalize feelings Outcome: Progressing   Problem: Health Behavior/Discharge Planning: Goal: Ability to make decisions will improve Outcome: Progressing Goal: Compliance with therapeutic regimen will improve Outcome: Progressing   Problem: Role Relationship: Goal: Will demonstrate positive changes in social behaviors and relationships Outcome: Progressing   Problem: Safety: Goal: Ability to disclose and discuss suicidal ideas will  improve Outcome: Progressing Goal: Ability to identify and utilize support systems that promote safety will improve Outcome: Progressing   Problem: Self-Concept: Goal: Will verbalize positive feelings about self Outcome: Progressing Goal: Level of anxiety will decrease Outcome: Progressing   Problem: Education: Goal: Knowledge of [place] [facility] Education information/materials will improve Outcome: Progressing Goal: Emotional status will improve Outcome: Progressing Goal: Mental status will improve Outcome: Progressing Goal: Verbalization of understanding the information provided will improve Outcome: Progressing   Problem: Self-Concept: Goal: Level of anxiety will decrease Outcome: Progressing

## 2019-03-26 ENCOUNTER — Inpatient Hospital Stay: Payer: BLUE CROSS/BLUE SHIELD | Admitting: Anesthesiology

## 2019-03-26 ENCOUNTER — Encounter: Payer: Self-pay | Admitting: Anesthesiology

## 2019-03-26 LAB — GLUCOSE, CAPILLARY
Glucose-Capillary: 187 mg/dL — ABNORMAL HIGH (ref 70–99)
Glucose-Capillary: 200 mg/dL — ABNORMAL HIGH (ref 70–99)
Glucose-Capillary: 174 mg/dL — ABNORMAL HIGH (ref 70–99)
Glucose-Capillary: 166 mg/dL — ABNORMAL HIGH (ref 70–99)
Glucose-Capillary: 114 mg/dL — ABNORMAL HIGH (ref 70–99)
Glucose-Capillary: 162 mg/dL — ABNORMAL HIGH (ref 70–99)
Glucose-Capillary: 157 mg/dL — ABNORMAL HIGH (ref 70–99)
Glucose-Capillary: 137 mg/dL — ABNORMAL HIGH (ref 70–99)
Glucose-Capillary: 155 mg/dL — ABNORMAL HIGH (ref 70–99)
Glucose-Capillary: 156 mg/dL — ABNORMAL HIGH (ref 70–99)
Glucose-Capillary: 142 mg/dL — ABNORMAL HIGH (ref 70–99)
Glucose-Capillary: 153 mg/dL — ABNORMAL HIGH (ref 70–99)
Glucose-Capillary: 202 mg/dL — ABNORMAL HIGH (ref 70–99)

## 2019-03-26 MED ORDER — FENTANYL CITRATE (PF) 100 MCG/2ML IJ SOLN: 25.0000 ug | INTRAMUSCULAR | Status: DC | PRN

## 2019-03-26 MED ORDER — ONDANSETRON HCL 4 MG/2ML IJ SOLN: 4.0000 mg | Freq: Once | INTRAMUSCULAR | Status: DC | PRN

## 2019-03-26 MED ORDER — MIDAZOLAM HCL 2 MG/2ML IJ SOLN
2.0000 mg | Freq: Once | INTRAMUSCULAR | Status: AC
  Administered 2019-03-26: 2 mg via INTRAVENOUS

## 2019-03-26 MED ORDER — GLYCOPYRROLATE 0.2 MG/ML IJ SOLN
INTRAMUSCULAR | Status: AC
  Filled 2019-03-26: qty 1

## 2019-03-26 MED ORDER — KETOROLAC TROMETHAMINE 30 MG/ML IJ SOLN
INTRAMUSCULAR | Status: AC
  Administered 2019-03-26: 30 mg via INTRAVENOUS
  Filled 2019-03-26: qty 1

## 2019-03-26 MED ORDER — SUCCINYLCHOLINE CHLORIDE 200 MG/10ML IV SOSY
PREFILLED_SYRINGE | INTRAVENOUS | Status: DC | PRN
  Administered 2019-03-26: 80 mg via INTRAVENOUS

## 2019-03-26 MED ORDER — KETOROLAC TROMETHAMINE 30 MG/ML IJ SOLN: 30.0000 mg | Freq: Once | INTRAMUSCULAR | Status: DC

## 2019-03-26 MED ORDER — SUCCINYLCHOLINE CHLORIDE 20 MG/ML IJ SOLN
INTRAMUSCULAR | Status: AC
  Filled 2019-03-26: qty 1

## 2019-03-26 MED ORDER — MIDAZOLAM HCL 2 MG/2ML IJ SOLN
INTRAMUSCULAR | Status: AC
  Filled 2019-03-26: qty 2

## 2019-03-26 MED ORDER — ESCITALOPRAM OXALATE 10 MG PO TABS
5.0000 mg | ORAL_TABLET | Freq: Every day | ORAL | Status: DC
  Administered 2019-03-26 – 2019-03-27 (×2): 5 mg via ORAL
  Filled 2019-03-26: qty 0.5
  Filled 2019-03-26: qty 1

## 2019-03-26 MED ORDER — GLYCOPYRROLATE 0.2 MG/ML IJ SOLN: 0.1000 mg | Freq: Once | INTRAMUSCULAR | Status: DC

## 2019-03-26 MED ORDER — METHOHEXITAL SODIUM 100 MG/10ML IV SOSY
PREFILLED_SYRINGE | INTRAVENOUS | Status: DC | PRN
  Administered 2019-03-26: 80 mg via INTRAVENOUS

## 2019-03-26 MED ORDER — SODIUM CHLORIDE 0.9 % IV SOLN
500.0000 mL | Freq: Once | INTRAVENOUS | Status: AC
  Administered 2019-03-26: 500 mL via INTRAVENOUS

## 2019-03-26 NOTE — Tx Team (Signed)
Interdisciplinary Treatment and Diagnostic Plan Update  03/26/2019 Time of Session: 830am Jeanette Rose MRN: [id number]  Principal Diagnosis: Severe recurrent major depression without psychotic features [facility])  Secondary Diagnoses: Principal Problem:   Severe recurrent major depression without psychotic features ([place]) Active Problems:   Type 2 diabetes mellitus with diabetic polyneuropathy, with long-term current use of insulin (HCC)   GERD (gastroesophageal reflux disease)   Hypertension   Obesity (BMI 30-39.9)   Constipation   History of hypothyroidism   Major depression   Current Medications:  Current Facility-Administered Medications  Medication Dose Route Frequency Provider Last Rate Last Dose  . 0.9 %  sodium chloride infusion  500 mL Intravenous Once Clapacs, John T, MD      . acetaminophen (TYLENOL) tablet 650 mg  650 mg Oral Q6H PRN Clapacs, John T, MD   650 mg at 03/25/19 1510  . alum & mag hydroxide-simeth (MAALOX/MYLANTA) 200-200-20 MG/5ML suspension 30 mL  30 mL Oral Q4H PRN Clapacs, John T, MD      . aspirin EC tablet 81 mg  81 mg Oral Daily Clapacs, Madie Reno, MD   81 mg at 03/25/19 0814  . atorvastatin (LIPITOR) tablet 80 mg  80 mg Oral q1800 Clapacs, Madie Reno, MD   80 mg at 03/25/19 1701  . busPIRone (BUSPAR) tablet 15 mg  15 mg Oral TID Sharma Covert, MD   15 mg at 03/25/19 1700  . docusate sodium (COLACE) capsule 100 mg  100 mg Oral BID Clapacs, John T, MD   100 mg at 03/25/19 1700  . DULoxetine (CYMBALTA) DR capsule 30 mg  30 mg Oral Daily Clapacs, Madie Reno, MD   30 mg at 03/25/19 0813  . feeding supplement (ENSURE ENLIVE) (ENSURE ENLIVE) liquid 237 mL  237 mL Oral BID BM Clapacs, Madie Reno, MD   237 mL at 03/25/19 1927  . glycopyrrolate (ROBINUL) injection 0.2 mg  0.2 mg Intravenous Once Clapacs, John T, MD      . hydrocortisone cream 1 %   Topical BID PRN Clapacs, Madie Reno, MD   1 application at 54/62/70 1601  . hydrOXYzine (ATARAX/VISTARIL) tablet 50 mg  50 mg Oral Q6H  PRN Clapacs, Madie Reno, MD   50 mg at 03/25/19 1510  . insulin aspart (novoLOG) injection 0-15 Units  0-15 Units Subcutaneous TID WC Clapacs, Madie Reno, MD   2 Units at 03/25/19 1700  . irbesartan (AVAPRO) tablet 150 mg  150 mg Oral Daily Clapacs, John T, MD   150 mg at 03/26/19 0830  . ketorolac (TORADOL) 30 MG/ML injection 30 mg  30 mg Intravenous Once Clapacs, John T, MD      . LORazepam (ATIVAN) tablet 1 mg  1 mg Oral Q8H PRN Clapacs, Madie Reno, MD   1 mg at 03/23/19 2130  . LORazepam (ATIVAN) tablet 1 mg  1 mg Oral BID Clapacs, Madie Reno, MD   1 mg at 03/25/19 1700  . magnesium hydroxide (MILK OF MAGNESIA) suspension 30 mL  30 mL Oral Daily PRN Clapacs, Madie Reno, MD   30 mL at 03/24/19 1337  . menthol-cetylpyridinium (CEPACOL) lozenge 3 mg  1 lozenge Oral PRN Clapacs, John T, MD      . metFORMIN (GLUCOPHAGE-XR) 24 hr tablet 1,000 mg  1,000 mg Oral BID WC Clapacs, John T, MD   1,000 mg at 03/25/19 1702  . midazolam (VERSED) injection 2 mg  2 mg Intravenous Once Clapacs, Madie Reno, MD      . midazolam (  VERSED) injection 2 mg  2 mg Intravenous Once Clapacs, John T, MD      . multivitamin with minerals tablet 1 tablet  1 tablet Oral Daily Clapacs, Madie Reno, MD   1 tablet at 03/25/19 [phone]  . ondansetron (ZOFRAN) injection 4 mg  4 mg Intravenous Once Clapacs, John T, MD      . ondansetron (ZOFRAN) injection 4 mg  4 mg Intravenous Once Clapacs, John T, MD      . pantoprazole (PROTONIX) EC tablet 40 mg  40 mg Oral BH-q7a Clapacs, Madie Reno, MD   40 mg at 03/26/19 0830  . polyethylene glycol (MIRALAX / GLYCOLAX) packet 17 g  17 g Oral Daily Sharma Covert, MD      . senna-docusate (Senokot-S) tablet 1 tablet  1 tablet Oral QHS Sharma Covert, MD   1 tablet at 03/25/19 2105  . sodium phosphate (FLEET) 7-19 GM/118ML enema 1 enema  1 enema Rectal Daily PRN Sharma Covert, MD      . thiamine (VITAMIN B-1) tablet 100 mg  100 mg Oral Daily Clapacs, Madie Reno, MD   100 mg at 03/25/19 0240   PTA Medications: Medications  Prior to Admission  Medication Sig Dispense Refill Last Dose  . aspirin (ASPIRIN LOW DOSE) 81 MG tablet Take 81 mg by mouth daily.    03/15/2019  . atorvastatin (LIPITOR) 80 MG tablet TAKE 1 TABLET (80 MG TOTAL) BY MOUTH DAILY AT 6 PM. 90 tablet 0 03/15/2019  . DULoxetine (CYMBALTA) 60 MG capsule Take 1 capsule (60 mg total) by mouth daily. 1 capsule 0 03/15/2019  . feeding supplement, ENSURE ENLIVE, (ENSURE ENLIVE) LIQD Take 237 mLs by mouth 2 (two) times daily between meals. 1 mL 0 03/15/2019  . hydrOXYzine (ATARAX/VISTARIL) 25 MG tablet Take 1 tablet (25 mg total) by mouth 3 (three) times daily as needed for anxiety. 1 tablet 0 03/19/2019 at 0918  . irbesartan (AVAPRO) 150 MG tablet Take 1 tablet (150 mg total) by mouth daily. 1 tablet 0 03/19/2019 at [place]  . LORazepam (ATIVAN) 0.5 MG tablet Take 3 tablets (1.5 mg total) by mouth at bedtime. 1 tablet 0 03/18/2019 at Unknown time  . LORazepam (ATIVAN) 1 MG tablet Take 1 tablet (1 mg total) by mouth every 8 (eight) hours as needed (withdrawal symptoms). 1 tablet 0 03/19/2019 at [place]  . metFORMIN (FORTAMET) 1000 MG (OSM) 24 hr tablet Take 1 tablet (1,000 mg total) by mouth 2 (two) times daily with a meal. 1 tablet 0 03/15/2019  . Multiple Vitamin (MULTIVITAMIN WITH MINERALS) TABS tablet Take 1 tablet by mouth daily. 1 tablet 0 03/15/2019  . nitrofurantoin, macrocrystal-monohydrate, (MACROBID) 100 MG capsule Take 1 capsule (100 mg total) by mouth every 12 (twelve) hours. 5 capsule 0 03/15/2019  . nortriptyline (PAMELOR) 25 MG capsule Take 1 capsule (25 mg total) by mouth at bedtime. 1 capsule 0 03/15/2019  . nortriptyline (PAMELOR) 25 MG capsule Take 1 capsule (25 mg total) by mouth daily. 1 capsule 0 03/15/2019  . Oxcarbazepine (TRILEPTAL) 300 MG tablet Take 1 tablet (300 mg total) by mouth 2 (two) times daily. 1 tablet 0 03/15/2019  . pantoprazole (PROTONIX) 40 MG tablet Take 1 tablet (40 mg total) by mouth every morning. 1 tablet 0 03/15/2019  . polyethylene  glycol (MIRALAX / GLYCOLAX) 17 g packet Take 17 g by mouth daily as needed for mild constipation or moderate constipation. 1 each 0 03/15/2019  . thiamine 100 MG tablet Take 1 tablet (  100 mg total) by mouth daily. 1 tablet 0 03/15/2019    Patient Stressors: Health problems Medication change or noncompliance  Patient Strengths: Ability for insight Average or above average intelligence Capable of independent living Communication skills Supportive family/friends  Treatment Modalities: Medication Management, Group therapy, Case management,  1 to 1 session with clinician, Psychoeducation, Recreational therapy.   Physician Treatment Plan for Primary Diagnosis: Severe recurrent major depression without psychotic features ([place]) Long Term Goal(s): Improvement in symptoms so as ready for discharge Improvement in symptoms so as ready for discharge   Short Term Goals: Ability to verbalize feelings will improve Ability to disclose and discuss suicidal ideas Ability to demonstrate self-control will improve Ability to maintain clinical measurements within normal limits will improve Compliance with prescribed medications will improve  Medication Management: Evaluate patient's response, side effects, and tolerance of medication regimen.  Therapeutic Interventions: 1 to 1 sessions, Unit Group sessions and Medication administration.  Evaluation of Outcomes: Progressing  Physician Treatment Plan for Secondary Diagnosis: Principal Problem:   Severe recurrent major depression without psychotic features ([place]) Active Problems:   Type 2 diabetes mellitus with diabetic polyneuropathy, with long-term current use of insulin (HCC)   GERD (gastroesophageal reflux disease)   Hypertension   Obesity (BMI 30-39.9)   Constipation   History of hypothyroidism   Major depression  Long Term Goal(s): Improvement in symptoms so as ready for discharge Improvement in symptoms so as ready for discharge   Short Term  Goals: Ability to verbalize feelings will improve Ability to disclose and discuss suicidal ideas Ability to demonstrate self-control will improve Ability to maintain clinical measurements within normal limits will improve Compliance with prescribed medications will improve     Medication Management: Evaluate patient's response, side effects, and tolerance of medication regimen.  Therapeutic Interventions: 1 to 1 sessions, Unit Group sessions and Medication administration.  Evaluation of Outcomes: Progressing   RN Treatment Plan for Primary Diagnosis: Severe recurrent major depression without psychotic features ([place]) Long Term Goal(s): Knowledge of disease and therapeutic regimen to maintain health will improve  Short Term Goals: Ability to verbalize feelings will improve, Ability to identify and develop effective coping behaviors will improve and Compliance with prescribed medications will improve  Medication Management: RN will administer medications as ordered by provider, will assess and evaluate patient's response and provide education to patient for prescribed medication. RN will report any adverse and/or side effects to prescribing provider.  Therapeutic Interventions: 1 on 1 counseling sessions, Psychoeducation, Medication administration, Evaluate responses to treatment, Monitor vital signs and CBGs as ordered, Perform/monitor CIWA, COWS, AIMS and Fall Risk screenings as ordered, Perform wound care treatments as ordered.  Evaluation of Outcomes: Progressing   LCSW Treatment Plan for Primary Diagnosis: Severe recurrent major depression without psychotic features ([place]) Long Term Goal(s): Safe transition to appropriate next level of care at discharge, Engage patient in therapeutic group addressing interpersonal concerns.  Short Term Goals: Engage patient in aftercare planning with referrals and resources, Increase emotional regulation, Facilitate acceptance of mental health diagnosis  and concerns and Increase skills for wellness and recovery  Therapeutic Interventions: Assess for all discharge needs, 1 to 1 time with Social worker, Explore available resources and support systems, Assess for adequacy in community support network, Educate family and significant other(s) on suicide prevention, Complete Psychosocial Assessment, Interpersonal group therapy.  Evaluation of Outcomes: Progressing   Progress in Treatment: Attending groups: Yes. Participating in groups: No. Taking medication as prescribed: Yes. Toleration medication: Yes. Family/Significant other contact made: Yes,  individual(s) contacted:  [name], sister [phone] Patient understands diagnosis: Yes. Discussing patient identified problems/goals with staff: Yes. Medical problems stabilized or resolved: Yes. Denies suicidal/homicidal ideation: Yes. Issues/concerns per patient self-inventory: No. Other: NA  New problem(s) identified: No, Describe:  none reported  New Short Term/Long Term Goal(s): Attend outpatient treatment, take medication as prescribed, develop and implement healthy coping methods to manage stress.  Patient Goals:  "To get ECT and get well"  Discharge Plan or Barriers: Pt will return home and receive outpatient treatment. 03/21/19-Pt is receiving ECT, reports somatic complaints. D/C plan TBD. Update 03/26/19: Pt unsure of where she wants to follow up ; Pt has option of Dr. Toy Care or [place] with Dr. Erling Cruz.   Reason for Continuation of Hospitalization: Depression Medication stabilization  Estimated Length of Stay:5-7 days  Recreational Therapy: Patient Stressors: N/A  Patient Goal: Patient will engage in groups without prompting or encouragement from LRT x3 group sessions within 5 recreation therapy group sessions    Attendees: Patient: Jeanette Rose 03/26/2019 9:23 AM  Physician:  03/26/2019 9:23 AM  Nursing:  03/26/2019 9:23 AM  RN Care Manager: 03/26/2019  9:23 AM  Social Worker: Minette Brine Karinna Beadles LCSW 03/26/2019 9:23 AM  Recreational Therapist:  03/26/2019 9:23 AM  Other:  03/26/2019 9:23 AM  Other:  03/26/2019 9:23 AM  Other: 03/26/2019 9:23 AM    Scribe for Treatment Team: Mariann Laster Aireonna Bauer, LCSW 03/26/2019 9:23 AM

## 2019-03-26 NOTE — Plan of Care (Signed)
D- Patient alert and oriented. Patient presents in an anxious, but pleasant mood on assessment stating that she slept "fair" last night and the only complaint that she had was of tooth pain. Patient did not request any pain medication from this writer. Patient denies any signs/symptoms of depression, however, she endorsed anxiety reporting "because I can't remember". Patient also denies SI, HI, AVH, at this time. Patient had no stated goals for today.  A- Scheduled medications administered to patient, per MD orders. Support and encouragement provided.  Routine safety checks conducted every 15 minutes.  Patient informed to notify staff with problems or concerns.  R- No adverse drug reactions noted. Patient contracts for safety at this time. Patient compliant with medications and treatment plan. Patient receptive, calm, and cooperative. Patient interacts well with others on the unit.  Patient remains safe at this time.  Problem: Education: Goal: Utilization of techniques to improve thought processes will improve Outcome: Progressing Goal: Knowledge of the prescribed therapeutic regimen will improve Outcome: Progressing   Problem: Activity: Goal: Interest or engagement in leisure activities will improve Outcome: Progressing Goal: Imbalance in normal sleep/wake cycle will improve Outcome: Progressing   Problem: Coping: Goal: Coping ability will improve Outcome: Progressing Goal: Will verbalize feelings Outcome: Progressing   Problem: Health Behavior/Discharge Planning: Goal: Ability to make decisions will improve Outcome: Progressing Goal: Compliance with therapeutic regimen will improve Outcome: Progressing   Problem: Role Relationship: Goal: Will demonstrate positive changes in social behaviors and relationships Outcome: Progressing   Problem: Safety: Goal: Ability to disclose and discuss suicidal ideas will improve Outcome: Progressing Goal: Ability to identify and utilize support  systems that promote safety will improve Outcome: Progressing   Problem: Self-Concept: Goal: Will verbalize positive feelings about self Outcome: Progressing Goal: Level of anxiety will decrease Outcome: Progressing   Problem: Education: Goal: Knowledge of [place] General Education information/materials will improve Outcome: Progressing Goal: Emotional status will improve Outcome: Progressing Goal: Mental status will improve Outcome: Progressing Goal: Verbalization of understanding the information provided will improve Outcome: Progressing   Problem: Self-Concept: Goal: Level of anxiety will decrease Outcome: Progressing

## 2019-03-26 NOTE — [facility] Group Notes (Deleted)
LCSW Group Therapy Note   03/26/2019 1:00 PM  Type of Therapy and Topic:  Group Therapy:  Overcoming Obstacles   Participation Level:  Minimal   Description of Group:    In this group patients will be encouraged to explore what they see as obstacles to their own wellness and recovery. They will be guided to discuss their thoughts, feelings, and behaviors related to these obstacles. The group will process together ways to cope with barriers, with attention given to specific choices patients can make. Each patient will be challenged to identify changes they are motivated to make in order to overcome their obstacles. This group will be process-oriented, with patients participating in exploration of their own experiences as well as giving and receiving support and challenge from other group members.   Therapeutic Goals: 1. Patient will identify personal and current obstacles as they relate to admission. 2. Patient will identify barriers that currently interfere with their wellness or overcoming obstacles.  3. Patient will identify feelings, thought process and behaviors related to these barriers. 4. Patient will identify two changes they are willing to make to overcome these obstacles:      Summary of Patient Progress Patient was present for group.  Patient appeared attentive while in group.  Patient shared that "depression" is an emotion that he feels when facing obstacles.     Therapeutic Modalities:   Cognitive Behavioral Therapy Solution Focused Therapy Motivational Interviewing Relapse Prevention Therapy  Assunta Curtis, MSW, LCSW 03/26/2019 11:55 AM

## 2019-03-26 NOTE — Anesthesia Postprocedure Evaluation (Signed)
Anesthesia Post Note  Patient: Jeanette Rose  Procedure(s) Performed: ECT TX  Patient location during evaluation: PACU Anesthesia Type: General Level of consciousness: awake and alert Pain management: pain level controlled Vital Signs Assessment: post-procedure vital signs reviewed and stable Respiratory status: spontaneous breathing, nonlabored ventilation, respiratory function stable and patient connected to nasal cannula oxygen Cardiovascular status: blood pressure returned to baseline and stable Postop Assessment: no apparent nausea or vomiting Anesthetic complications: no     Last Vitals:  Vitals:   03/26/19 1226 03/26/19 1346  BP:  (!) 147/80  Pulse:  100  Resp:  16  Temp: 36.4 C 36.5 C  SpO2:  100%    Last Pain:  Vitals:   03/26/19 1346  TempSrc: Oral  PainSc:                  Tavarion Babington S

## 2019-03-26 NOTE — Plan of Care (Signed)
  Problem: Education: Goal: Utilization of techniques to improve thought processes will improve Outcome: Progressing Goal: Knowledge of the prescribed therapeutic regimen will improve Outcome: Progressing  D: Patient has been out of her room more. Sitting in day room, not really interacting with other patients. Denies any further issues with constipation and says she had a bowel movement. Did admit to having some suicidal thoughts earlier today, but says they have gone away. Patient contracts for safety. A: Continue to monitor for safety. R: Safety maintained.

## 2019-03-26 NOTE — Anesthesia Preprocedure Evaluation (Signed)
Anesthesia Evaluation  Patient identified by MRN, date of birth, ID band Patient awake    Reviewed: Allergy & Precautions, NPO status , Patient's Chart, lab work & pertinent test results, reviewed documented beta blocker date and time   Airway Mallampati: III  TM Distance: >3 FB     Dental  (+) Chipped   Pulmonary asthma , former smoker,           Cardiovascular hypertension, Pt. on medications      Neuro/Psych PSYCHIATRIC DISORDERS Anxiety Depression  Neuromuscular disease    GI/Hepatic GERD  Controlled,(+) Hepatitis -  Endo/Other  diabetes, Type 2Hypothyroidism   Renal/GU      Musculoskeletal  (+) Fibromyalgia -  Abdominal   Peds  Hematology  (+) anemia ,   Anesthesia Other Findings   Reproductive/Obstetrics                             Anesthesia Physical Anesthesia Plan  ASA: III  Anesthesia Plan: General   Post-op Pain Management:    Induction: Intravenous  PONV Risk Score and Plan:   Airway Management Planned:   Additional Equipment:   Intra-op Plan:   Post-operative Plan:   Informed Consent: I have reviewed the patients History and Physical, chart, labs and discussed the procedure including the risks, benefits and alternatives for the proposed anesthesia with the patient or authorized representative who has indicated his/her understanding and acceptance.       Plan Discussed with: CRNA  Anesthesia Plan Comments:         Anesthesia Quick Evaluation

## 2019-03-26 NOTE — [facility] Group Notes (Signed)
LCSW Group Therapy Note   03/26/2019 1:00 PM  Type of Therapy and Topic:  Group Therapy:  Overcoming Obstacles   Participation Level:  Did Not Attend   Description of Group:    In this group patients will be encouraged to explore what they see as obstacles to their own wellness and recovery. They will be guided to discuss their thoughts, feelings, and behaviors related to these obstacles. The group will process together ways to cope with barriers, with attention given to specific choices patients can make. Each patient will be challenged to identify changes they are motivated to make in order to overcome their obstacles. This group will be process-oriented, with patients participating in exploration of their own experiences as well as giving and receiving support and challenge from other group members.   Therapeutic Goals: 1. Patient will identify personal and current obstacles as they relate to admission. 2. Patient will identify barriers that currently interfere with their wellness or overcoming obstacles.  3. Patient will identify feelings, thought process and behaviors related to these barriers. 4. Patient will identify two changes they are willing to make to overcome these obstacles:      Summary of Patient Progress X   Therapeutic Modalities:   Cognitive Behavioral Therapy Solution Focused Therapy Motivational Interviewing Relapse Prevention Therapy  Assunta Curtis, MSW, LCSW 03/26/2019 1:55 PM

## 2019-03-26 NOTE — Progress Notes (Signed)
[facility] MD Progress Note  03/26/2019 5:39 PM Jeanette Rose  MRN:  [id number] Subjective: Follow-up for this patient with severe depression.  She had ECT this morning.  Treatment was tolerated without difficulty.  Patient was extremely anxious this morning and into the afternoon as well.  She complains about not being able to remember things.  It is easily demonstrated in conversation with her that her worries are exaggerated.  She is not showing any sign of delirium more really remarkable confusion.  Not sure why she is so much more anxious today.  No other physical complaints.  Over the weekend had seem to be doing better. Principal Problem: Severe recurrent major depression without psychotic features ([place]) Diagnosis: Principal Problem:   Severe recurrent major depression without psychotic features ([place]) Active Problems:   Type 2 diabetes mellitus with diabetic polyneuropathy, with long-term current use of insulin (HCC)   GERD (gastroesophageal reflux disease)   Hypertension   Obesity (BMI 30-39.9)   Constipation   History of hypothyroidism   Major depression  Total Time spent with patient: 45 minutes  Past Psychiatric History: Past history of chronic depression and anxiety.  Past Medical History:  Past Medical History:  Diagnosis Date  . Acute acalculous cholecystitis s/p lap chole 04/03/2014 04/02/2014  . Allergy   . Anemia   . Anxiety   . Asthma   . Depression   . Fatty liver    noted on CT per [facility] records  . Fibromyalgia   . GERD (gastroesophageal reflux disease)   . Hyperlipidemia associated with type 2 diabetes mellitus ([place])   . Hypertension   . Hypothyroid 2015   Transiently in 2015. Synthroid 22mg was stopped 05/07/2016 w/ tsh nml following.  . Obesity (BMI 30-39.9)   . OSA (obstructive sleep apnea)    non-compliant with CPAP  . Seborrheic dermatitis    [facility]  . Stroke ([facility]    TIA  . [place] 2 diabetes [place] ([place]     Past  Surgical History:  Procedure Laterality Date  . BREAST REDUCTION SURGERY  06/2008  . CHOLECYSTECTOMY N/A 04/03/2014   Procedure: LAPAROSCOPIC CHOLECYSTECTOMY ;  Surgeon: SAdin Hector MD;  Location: [facility] ORS;  Service: General;  Laterality: N/A;  . REDUCTION MAMMAPLASTY    . TUBAL LIGATION     Family History:  Family History  Problem Relation Age of Onset  . Atrial fibrillation Mother   . Heart disease Father        CAD  . Diabetes Brother   . Cancer Maternal Grandmother   . Diabetes Paternal Grandmother   . Fibromyalgia Sister   . Colitis Sister    Family Psychiatric  History: See previous Social History:  Social History   Substance and Sexual Activity  Alcohol Use No     Social History   Substance and Sexual Activity  Drug Use No    Social History   Socioeconomic History  . Marital status: Divorced    Spouse name: Not on file  . Number of children: Not on file  . Years of education: Not on file  . Highest education level: Not on file  Occupational History  . Not on file  Social Needs  . Financial resource strain: Not on file  . Food insecurity    Worry: Not on file    Inability: Not on file  . Transportation needs    Medical: Not on file    Non-medical: Not on file  Tobacco Use  . Smoking status: Former Smoker    Packs/day: 4.00    Years: 15.00    Pack years: 60.00    Types: Cigarettes    Quit date: 04/22/2004    Years since quitting: 14.9  . Smokeless tobacco: Never Used  . Tobacco comment: 4 pack year hx  Substance and Sexual Activity  . Alcohol use: No  . Drug use: No  . Sexual activity: Never  Lifestyle  . Physical activity    Days per week: Not on file    Minutes per session: Not on file  . Stress: Not on file  Relationships  . Social Herbalist on phone: Not on file    Gets together: Not on file    Attends religious service: Not on file    Active member of club or organization: Not on file    Attends meetings of clubs or  organizations: Not on file    Relationship status: Not on file  Other Topics Concern  . Not on file  Social History Narrative  . Not on file   Additional Social History:                         Sleep: Fair  Appetite:  Fair  Current Medications: Current Facility-Administered Medications  Medication Dose Route Frequency Provider Last Rate Last Dose  . acetaminophen (TYLENOL) tablet 650 mg  650 mg Oral Q6H PRN Ewald Beg T, MD   650 mg at 03/25/19 1510  . alum & mag hydroxide-simeth (MAALOX/MYLANTA) 200-200-20 MG/5ML suspension 30 mL  30 mL Oral Q4H PRN Bryannah Boston T, MD      . aspirin EC tablet 81 mg  81 mg Oral Daily Veneda Kirksey, Madie Reno, MD   81 mg at 03/26/19 1348  . atorvastatin (LIPITOR) tablet 80 mg  80 mg Oral q1800 Veyda Kaufman T, MD   80 mg at 03/26/19 1722  . busPIRone (BUSPAR) tablet 15 mg  15 mg Oral TID Sharma Covert, MD   15 mg at 03/26/19 1722  . docusate sodium (COLACE) capsule 100 mg  100 mg Oral BID Tobby Fawcett, Madie Reno, MD   100 mg at 03/26/19 1722  . DULoxetine (CYMBALTA) DR capsule 30 mg  30 mg Oral Daily Irais Mottram, Madie Reno, MD   30 mg at 03/26/19 1348  . feeding supplement (ENSURE ENLIVE) (ENSURE ENLIVE) liquid 237 mL  237 mL Oral BID BM Norah Devin, Madie Reno, MD   237 mL at 03/25/19 1927  . fentaNYL (SUBLIMAZE) injection 25 mcg  25 mcg Intravenous Q5 min PRN Gunnar Bulla, MD      . glycopyrrolate (ROBINUL) injection 0.1 mg  0.1 mg Intravenous Once Kateryna Grantham T, MD      . glycopyrrolate (ROBINUL) injection 0.2 mg  0.2 mg Intravenous Once Caryle Helgeson T, MD      . hydrocortisone cream 1 %   Topical BID PRN Annette Liotta, Madie Reno, MD   1 application at 45/62/56 1601  . hydrOXYzine (ATARAX/VISTARIL) tablet 50 mg  50 mg Oral Q6H PRN Lari Linson, Madie Reno, MD   50 mg at 03/25/19 1510  . insulin aspart (novoLOG) injection 0-15 Units  0-15 Units Subcutaneous TID WC Jamarion Jumon[place], Madie Reno, MD   5 Units at 03/26/19 1722  . irbesartan (AVAPRO) tablet 150 mg  150 mg Oral Daily Saisha Hogue,  Lavone Weisel T, MD   150 mg at 03/26/19 0830  . ketorolac (TORADOL) 30 MG/ML injection 30 mg  30 mg Intravenous Once Issak Goley T, MD      . LORazepam (ATIVAN) tablet 1 mg  1 mg Oral Q8H PRN Daqwan Dougal, Madie Reno, MD   1 mg at 03/23/19 2130  . LORazepam (ATIVAN) tablet 1 mg  1 mg Oral BID Chrishawn Kring, Madie Reno, MD   1 mg at 03/26/19 1722  . magnesium hydroxide (MILK OF MAGNESIA) suspension 30 mL  30 mL Oral Daily PRN Analiya Porco, Madie Reno, MD   30 mL at 03/24/19 1337  . menthol-cetylpyridinium (CEPACOL) lozenge 3 mg  1 lozenge Oral PRN Kyomi Hector T, MD      . metFORMIN (GLUCOPHAGE-XR) 24 hr tablet 1,000 mg  1,000 mg Oral BID WC Lakindra Wible T, MD   1,000 mg at 03/26/19 1722  . midazolam (VERSED) injection 2 mg  2 mg Intravenous Once Shilo Pauwels T, MD      . midazolam (VERSED) injection 2 mg  2 mg Intravenous Once Delonta Yohannes T, MD      . multivitamin with minerals tablet 1 tablet  1 tablet Oral Daily Gloristine Turrubiates, Madie Reno, MD   1 tablet at 03/26/19 1347  . ondansetron (ZOFRAN) injection 4 mg  4 mg Intravenous Once Cherilynn Schomburg T, MD      . ondansetron [facility]) injection 4 mg  4 mg Intravenous Once Jusitn Salsgiver T, MD      . ondansetron [facility]) injection 4 mg  4 mg Intravenous Once PRN Gunnar Bulla, MD      . pantoprazole (PROTONIX) EC tablet 40 mg  40 mg Oral BH-q7a Aleida Crandell, Madie Reno, MD   40 mg at 03/26/19 0830  . polyethylene glycol (MIRALAX / GLYCOLAX) packet 17 g  17 g Oral Daily Sharma Covert, MD      . senna-docusate (Senokot-S) tablet 1 tablet  1 tablet Oral QHS Sharma Covert, MD   1 tablet at 03/25/19 2105  . sodium phosphate (FLEET) 7-19 GM/118ML enema 1 enema  1 enema Rectal Daily PRN Sharma Covert, MD      . thiamine (VITAMIN B-1) tablet 100 mg  100 mg Oral Daily Khalen Styer, Madie Reno, MD   100 mg at 03/26/19 1348    Lab Results:  Results for orders placed or performed during the hospital encounter of 03/15/19 (from the past 48 hour(s))  Glucose, capillary     Status: Abnormal   Collection  Time: 03/24/19  8:56 PM  Result Value Ref Range   Glucose-Capillary 114 (H) 70 - 99 mg/dL   Comment 1 Notify RN   Glucose, capillary     Status: Abnormal   Collection Time: 03/25/19  7:04 AM  Result Value Ref Range   Glucose-Capillary 162 (H) 70 - 99 mg/dL   Comment 1 Notify RN   Glucose, capillary     Status: Abnormal   Collection Time: 03/25/19 11:20 AM  Result Value Ref Range   Glucose-Capillary 157 (H) 70 - 99 mg/dL  Glucose, capillary     Status: Abnormal   Collection Time: 03/25/19  4:00 PM  Result Value Ref Range   Glucose-Capillary 137 (H) 70 - 99 mg/dL   Comment 1 Notify RN   Glucose, capillary     Status: Abnormal   Collection Time: 03/25/19  9:04 PM  Result Value Ref Range   Glucose-Capillary 155 (H) 70 - 99 mg/dL   Comment 1 Notify RN   Glucose, capillary     Status: Abnormal   Collection Time: 03/26/19  6:24 AM  Result Value  Ref Range   Glucose-Capillary 156 (H) 70 - 99 mg/dL  Glucose, capillary     Status: Abnormal   Collection Time: 03/26/19 12:00 PM  Result Value Ref Range   Glucose-Capillary 142 (H) 70 - 99 mg/dL  Glucose, capillary     Status: Abnormal   Collection Time: 03/26/19  1:43 PM  Result Value Ref Range   Glucose-Capillary 153 (H) 70 - 99 mg/dL  Glucose, capillary     Status: Abnormal   Collection Time: 03/26/19  4:22 PM  Result Value Ref Range   Glucose-Capillary 202 (H) 70 - 99 mg/dL    Blood Alcohol level:  Lab Results  Component Value Date   ETH <10 66/44/0347    Metabolic Disorder Labs: Lab Results  Component Value Date   HGBA1C 6.3 (H) 03/09/2019   MPG 134.11 03/09/2019   MPG 180 (H) 04/03/2014   No results found for: PROLACTIN Lab Results  Component Value Date   CHOL 174 10/09/2018   TRIG 210 (H) 10/09/2018   HDL 44 10/09/2018   CHOLHDL 4.0 10/09/2018   LDLCALC 88 10/09/2018   LDLCALC 77 06/28/2018    Physical Findings: AIMS:  , ,  ,  ,    CIWA:    COWS:     Musculoskeletal: Strength & Muscle Tone: within normal  limits Gait & Station: normal Patient leans: N/A  Psychiatric Specialty Exam: Physical Exam  Nursing note and vitals reviewed. Constitutional: She appears well-developed and well-nourished.  HENT:  Head: Normocephalic and atraumatic.  Eyes: Pupils are equal, round, and reactive to light. Conjunctivae are normal.  Neck: Normal range of motion.  Cardiovascular: Regular rhythm and normal heart sounds.  Respiratory: Effort normal. No respiratory distress.  GI: Soft.  Musculoskeletal: Normal range of motion.  Neurological: She is alert.  Skin: Skin is warm and dry.  Psychiatric: Her mood appears anxious. Her speech is tangential. She is agitated. She is not aggressive. She expresses no suicidal ideation. She exhibits abnormal recent memory.    Review of Systems  Constitutional: Negative.   HENT: Negative.   Eyes: Negative.   Respiratory: Negative.   Cardiovascular: Negative.   Gastrointestinal: Negative.   Musculoskeletal: Negative.   Skin: Negative.   Neurological: Negative.   Psychiatric/Behavioral: Positive for memory loss.    Blood pressure (!) 147/80, pulse 100, temperature 97.7 F (36.5 C), temperature source Oral, resp. rate 16, height 5' 4"  (1.626 m), weight 72.6 kg, SpO2 100 %.Body mass index is 27.46 kg/m.  General Appearance: Casual  Eye Contact:  Good  Speech:  Pressured  Volume:  Increased  Mood:  Anxious  Affect:  Congruent  Thought Process:  Goal Directed  Orientation:  Full (Time, Place, and Person)  Thought Content:  Logical  Suicidal Thoughts:  No  Homicidal Thoughts:  No  Memory:  Immediate;   Fair Recent;   Fair Remote;   Fair  Judgement:  Fair  Insight:  Fair  Psychomotor Activity:  Decreased  Concentration:  Concentration: Fair  Recall:  [employer] of Knowledge:  Fair  Language:  Fair  Akathisia:  No  Handed:  Right  AIMS (if indicated):     Assets:  Desire for Improvement Resilience  ADL's:  Intact  Cognition:  Impaired,  Mild  Sleep:   Number of Hours: 7     Treatment Plan Summary: Daily contact with patient to assess and evaluate symptoms and progress in treatment, Medication management and Plan Patient had seem to be showing some response to  ECT but has backslid into a lot of anxiety for unclear reasons.  Nothing was changed about her Ativan.  I think I will start trying to add some more SSRI to her because of how profoundly anxious she seems.  For now I think it is worthwhile at least through Wednesday to continue ECT.  Blood sugars are running a little bit high we will need to keep an eye on it.  Alethia Berthold, MD 03/26/2019, 5:39 PM

## 2019-03-26 NOTE — Procedures (Signed)
ECT SERVICES Physician's Interval Evaluation & Treatment Note  Patient Identification: Jeanette Rose MRN:  [id number] Date of Evaluation:  03/26/2019 TX #: [id number]  MADRS:   MMSE:   P.E. Findings:  No change to physical exam  Psychiatric Interval Note:  Very very anxious and agitated this morning  Subjective:  Patient is a 63 y.o. female seen for evaluation for Electroconvulsive Therapy. Talks about having trouble with her memory.  Does appear to have at least a little bit of short-term memory loss.  Treatment Summary:   [x]   Right Unilateral             []  Bilateral   % Energy : 0.3 ms 60%   Impedance: 2410 ohms  Seizure Energy Index: 4639 V squared  Postictal Suppression Index: None no reading  Seizure Concordance Index: 76%  Medications  Pre Shock: Robinul 0.1 mg Zofran 4 mg Brevital 80 mg succinylcholine 100 mg Toradol 30 mg  Post Shock: Versed 2 mg  Seizure Duration: 28 seconds by EMG I read 70 seconds on the EEG.  Computer did not make a good EEG reading for reasons I do not understand because it was a very clear-cut seizure.   Comments: Patient is much more anxious and seems to have backslid today.  Not quite sure what is going on.  We will reassess and then make a decision about further treatment on Wednesday.  Lungs:  [x]   Clear to auscultation               []  Other:   Heart:    [x]   Regular rhythm             []  irregular rhythm    [x]   Previous H&P reviewed, patient examined and there are NO CHANGES                 []   Previous H&P reviewed, patient examined and there are changes noted.   Alethia Berthold, MD 6/29/202011:39 AM

## 2019-03-26 NOTE — Transfer of Care (Signed)
Immediate Anesthesia Transfer of Care Note  Patient: Jeanette Rose  Procedure(s) Performed: ECT TX  Patient Location: PACU  Anesthesia Type:General  Level of Consciousness: sedated  Airway & Oxygen Therapy: Patient Spontanous Breathing and Patient connected to face mask oxygen  Post-op Assessment: Report given to RN and Post -op Vital signs reviewed and stable  Post vital signs: Reviewed and stable  Last Vitals:  Vitals Value Taken Time  BP 138/83 03/26/19 1156  Temp 36.1 C 03/26/19 1156  Pulse 89 03/26/19 1201  Resp 17 03/26/19 1201  SpO2 100 % 03/26/19 1201  Vitals shown include unvalidated device data.  Last Pain:  Vitals:   03/26/19 1156  TempSrc:   PainSc: Asleep      Patients Stated Pain Goal: 4 (27/87/18 3672)  Complications: No apparent anesthesia complications

## 2019-03-26 NOTE — Progress Notes (Signed)
Recreation Therapy Notes   Date: 03/26/2019  Time: 9:30 am   Location: Craft room   Behavioral response: N/A   Intervention Topic: Stress  Discussion/Intervention: Patient did not attend group.   Clinical Observations/Feedback:  Patient did not attend group.   Khayri Kargbo LRT/CTRS        Kaitlan Bin 03/26/2019 10:44 AM

## 2019-03-26 NOTE — Anesthesia Procedure Notes (Signed)
Date/Time: 03/26/2019 11:46 AM Performed by: Dionne Bucy, CRNA Pre-anesthesia Checklist: Patient identified, Emergency Drugs available, Suction available and Patient being monitored Patient Re-evaluated:Patient Re-evaluated prior to induction Oxygen Delivery Method: Circle system utilized Preoxygenation: Pre-oxygenation with 100% oxygen Induction Type: IV induction Ventilation: Mask ventilation without difficulty and Mask ventilation throughout procedure Airway Equipment and Method: Bite block Placement Confirmation: positive ETCO2 Dental Injury: Teeth and Oropharynx as per pre-operative assessment

## 2019-03-26 NOTE — H&P (Signed)
Jeanette Rose is an 63 y.o. female.   Chief Complaint: Patient is complaining of having memory loss and being very anxious today HPI: History of recurrent severe depression had been showing some response to ECT  Past Medical History:  Diagnosis Date  . Acute acalculous cholecystitis s/p lap chole 04/03/2014 04/02/2014  . Allergy   . Anemia   . Anxiety   . Asthma   . Depression   . Fatty liver    noted on CT per [facility] records  . Fibromyalgia   . GERD (gastroesophageal reflux disease)   . Hyperlipidemia associated with type 2 diabetes mellitus ([place])   . Hypertension   . Hypothyroid 2015   Transiently in 2015. Synthroid 58mg was stopped 05/07/2016 w/ tsh nml following.  . Obesity (BMI 30-39.9)   . OSA (obstructive sleep apnea)    non-compliant with CPAP  . Seborrheic dermatitis    [facility]  . Stroke ([facility]    TIA  . Type 2 diabetes mellitus ([place]     Past Surgical History:  Procedure Laterality Date  . BREAST REDUCTION SURGERY  06/2008  . CHOLECYSTECTOMY N/A 04/03/2014   Procedure: LAPAROSCOPIC CHOLECYSTECTOMY ;  Surgeon: SAdin Hector MD;  Location: [facility]S;  Service: General;  Laterality: N/A;  . REDUCTION MAMMAPLASTY    . TUBAL LIGATION      Family History  Problem Relation Age of Onset  . Atrial fibrillation Mother   . Heart disease Father        CAD  . Diabetes Brother   . Cancer Maternal Grandmother   . Diabetes Paternal Grandmother   . Fibromyalgia Sister   . Colitis Sister    Social History:  reports that she quit smoking about 14 years ago. Her smoking use included cigarettes. She has a 60.00 pack-year smoking history. She has never used smokeless tobacco. She reports that she does not drink alcohol or use drugs.  Allergies:  Allergies  Allergen Reactions  . Farxiga [Dapagliflozin] Anxiety and Other (See Comments)    Made depression and anxiety worse  . Rexulti [Brexpiprazole] Anxiety and Other (See Comments)    Made  anxiety worse  . Trintellix [Vortioxetine] Anxiety and Other (See Comments)    Made anxiety worse    Medications Prior to Admission  Medication Sig Dispense Refill  . aspirin (ASPIRIN LOW DOSE) 81 MG tablet Take 81 mg by mouth daily.     .Marland Kitchenatorvastatin (LIPITOR) 80 MG tablet TAKE 1 TABLET (80 MG TOTAL) BY MOUTH DAILY AT 6 PM. 90 tablet 0  . DULoxetine (CYMBALTA) 60 MG capsule Take 1 capsule (60 mg total) by mouth daily. 1 capsule 0  . feeding supplement, ENSURE ENLIVE, (ENSURE ENLIVE) LIQD Take 237 mLs by mouth 2 (two) times daily between meals. 1 mL 0  . hydrOXYzine (ATARAX/VISTARIL) 25 MG tablet Take 1 tablet (25 mg total) by mouth 3 (three) times daily as needed for anxiety. 1 tablet 0  . irbesartan (AVAPRO) 150 MG tablet Take 1 tablet (150 mg total) by mouth daily. 1 tablet 0  . LORazepam (ATIVAN) 0.5 MG tablet Take 3 tablets (1.5 mg total) by mouth at bedtime. 1 tablet 0  . LORazepam (ATIVAN) 1 MG tablet Take 1 tablet (1 mg total) by mouth every 8 (eight) hours as needed (withdrawal symptoms). 1 tablet 0  . metFORMIN (FORTAMET) 1000 MG (OSM) 24 hr tablet Take 1 tablet (1,000 mg total) by mouth 2 (two) times daily with a meal.  1 tablet 0  . Multiple Vitamin (MULTIVITAMIN WITH MINERALS) TABS tablet Take 1 tablet by mouth daily. 1 tablet 0  . nitrofurantoin, macrocrystal-monohydrate, (MACROBID) 100 MG capsule Take 1 capsule (100 mg total) by mouth every 12 (twelve) hours. 5 capsule 0  . nortriptyline (PAMELOR) 25 MG capsule Take 1 capsule (25 mg total) by mouth at bedtime. 1 capsule 0  . nortriptyline (PAMELOR) 25 MG capsule Take 1 capsule (25 mg total) by mouth daily. 1 capsule 0  . Oxcarbazepine (TRILEPTAL) 300 MG tablet Take 1 tablet (300 mg total) by mouth 2 (two) times daily. 1 tablet 0  . pantoprazole (PROTONIX) 40 MG tablet Take 1 tablet (40 mg total) by mouth every morning. 1 tablet 0  . polyethylene glycol (MIRALAX / GLYCOLAX) 17 g packet Take 17 g by mouth daily as needed for mild  constipation or moderate constipation. 1 each 0  . thiamine 100 MG tablet Take 1 tablet (100 mg total) by mouth daily. 1 tablet 0    Results for orders placed or performed during the hospital encounter of 03/15/19 (from the past 48 hour(s))  Glucose, capillary     Status: Abnormal   Collection Time: 03/24/19  4:13 PM  Result Value Ref Range   Glucose-Capillary 166 (H) 70 - 99 mg/dL  Glucose, capillary     Status: Abnormal   Collection Time: 03/24/19  8:56 PM  Result Value Ref Range   Glucose-Capillary 114 (H) 70 - 99 mg/dL   Comment 1 Notify RN   Glucose, capillary     Status: Abnormal   Collection Time: 03/25/19  7:04 AM  Result Value Ref Range   Glucose-Capillary 162 (H) 70 - 99 mg/dL   Comment 1 Notify RN   Glucose, capillary     Status: Abnormal   Collection Time: 03/25/19 11:20 AM  Result Value Ref Range   Glucose-Capillary 157 (H) 70 - 99 mg/dL  Glucose, capillary     Status: Abnormal   Collection Time: 03/25/19  4:00 PM  Result Value Ref Range   Glucose-Capillary 137 (H) 70 - 99 mg/dL   Comment 1 Notify RN   Glucose, capillary     Status: Abnormal   Collection Time: 03/25/19  9:04 PM  Result Value Ref Range   Glucose-Capillary 155 (H) 70 - 99 mg/dL   Comment 1 Notify RN   Glucose, capillary     Status: Abnormal   Collection Time: 03/26/19  6:24 AM  Result Value Ref Range   Glucose-Capillary 156 (H) 70 - 99 mg/dL   No results found.  Review of Systems  Constitutional: Negative.   HENT: Negative.   Eyes: Negative.   Respiratory: Negative.   Cardiovascular: Negative.   Gastrointestinal: Negative.   Musculoskeletal: Negative.   Skin: Negative.   Neurological: Negative.   Psychiatric/Behavioral: Positive for memory loss. The patient is nervous/anxious.     Blood pressure (!) 168/84, pulse 91, temperature 97.8 F (36.6 C), temperature source Oral, resp. rate 18, height 5' 4"  (1.626 m), weight 72.6 kg, SpO2 98 %. Physical Exam  Nursing note and vitals  reviewed. Constitutional: She appears well-developed and well-nourished.  HENT:  Head: Normocephalic and atraumatic.  Eyes: Pupils are equal, round, and reactive to light. Conjunctivae are normal.  Neck: Normal range of motion.  Cardiovascular: Regular rhythm and normal heart sounds.  Respiratory: Effort normal.  GI: Soft.  Musculoskeletal: Normal range of motion.  Neurological: She is alert.  Skin: Skin is warm and dry.  Psychiatric: Her speech  is normal. Judgment normal. Her mood appears anxious. She is agitated. She is not aggressive. She expresses no homicidal and no suicidal ideation. She exhibits abnormal recent memory.     Assessment/Plan We discussed various options about the continued course and suggested that we continue today and then reassess how she is feeling.  Unclear why she has had a setback over the last couple days  Alethia Berthold, MD 03/26/2019, 11:36 AM

## 2019-03-26 NOTE — Progress Notes (Signed)
D: Patient has been out of her room more. Sitting in day room, not really interacting with other patients. Denies any further issues with constipation and says she had a bowel movement. Did admit to having some suicidal thoughts earlier today, but says they have gone away. Patient contracts for safety. A: Continue to monitor for safety. R: Safety maintained.

## 2019-03-27 ENCOUNTER — Other Ambulatory Visit: Payer: Self-pay | Admitting: Psychiatry

## 2019-03-27 LAB — GLUCOSE, CAPILLARY
Glucose-Capillary: 165 mg/dL — ABNORMAL HIGH (ref 70–99)
Glucose-Capillary: 196 mg/dL — ABNORMAL HIGH (ref 70–99)
Glucose-Capillary: 163 mg/dL — ABNORMAL HIGH (ref 70–99)
Glucose-Capillary: 130 mg/dL — ABNORMAL HIGH (ref 70–99)

## 2019-03-27 MED ORDER — AMOXICILLIN 500 MG PO CAPS
500.0000 mg | ORAL_CAPSULE | Freq: Two times a day (BID) | ORAL | Status: DC
  Administered 2019-03-27 – 2019-03-30 (×7): 500 mg via ORAL
  Filled 2019-03-27 (×7): qty 1

## 2019-03-27 MED ORDER — DULOXETINE HCL 20 MG PO CPEP
20.0000 mg | ORAL_CAPSULE | Freq: Every day | ORAL | Status: DC
  Administered 2019-03-28 – 2019-03-30 (×3): 20 mg via ORAL
  Filled 2019-03-27 (×3): qty 1

## 2019-03-27 MED ORDER — IBUPROFEN 600 MG PO TABS
600.0000 mg | ORAL_TABLET | Freq: Four times a day (QID) | ORAL | Status: DC | PRN
  Administered 2019-03-27 (×2): 600 mg via ORAL
  Filled 2019-03-27 (×2): qty 1

## 2019-03-27 MED ORDER — ESCITALOPRAM OXALATE 10 MG PO TABS
10.0000 mg | ORAL_TABLET | Freq: Every day | ORAL | Status: DC
  Administered 2019-03-28 – 2019-03-30 (×3): 10 mg via ORAL
  Filled 2019-03-27 (×3): qty 1

## 2019-03-27 NOTE — Plan of Care (Signed)
D- Patient alert and oriented. Patient presents in a pleasant mood on assessment stating that she slept "fair" last night and the only complaint that she had is of right lower tooth pain, in which she rated a "4/10" and requested pain medication. Patient reported a depression level of "4/10" on her self-inventory, however, she did not elaborate to this Probation officer as to why she reported this. Patient endorsed a "10/10" anxiety level stating that "not being able to remember nothing" is making her anxious. Patient denies SI, HI, AVH, at this time. Patient had no stated goals for today.  A- Scheduled medications administered to patient, per MD orders. Support and encouragement provided.  Routine safety checks conducted every 15 minutes.  Patient informed to notify staff with problems or concerns.  R- No adverse drug reactions noted. Patient contracts for safety at this time. Patient compliant with medications and treatment plan. Patient receptive, calm, and cooperative. Patient interacts well with others on the unit.  Patient remains safe at this time.  Problem: Education: Goal: Utilization of techniques to improve thought processes will improve Outcome: Progressing Goal: Knowledge of the prescribed therapeutic regimen will improve Outcome: Progressing   Problem: Activity: Goal: Interest or engagement in leisure activities will improve Outcome: Progressing Goal: Imbalance in normal sleep/wake cycle will improve Outcome: Progressing   Problem: Coping: Goal: Coping ability will improve Outcome: Progressing Goal: Will verbalize feelings Outcome: Progressing   Problem: Health Behavior/Discharge Planning: Goal: Ability to make decisions will improve Outcome: Progressing Goal: Compliance with therapeutic regimen will improve Outcome: Progressing   Problem: Role Relationship: Goal: Will demonstrate positive changes in social behaviors and relationships Outcome: Progressing   Problem:  Safety: Goal: Ability to disclose and discuss suicidal ideas will improve Outcome: Progressing Goal: Ability to identify and utilize support systems that promote safety will improve Outcome: Progressing   Problem: Self-Concept: Goal: Will verbalize positive feelings about self Outcome: Progressing Goal: Level of anxiety will decrease Outcome: Progressing   Problem: Education: Goal: Knowledge of [facility] Education information/materials will improve Outcome: Progressing Goal: Emotional status will improve Outcome: Progressing Goal: Mental status will improve Outcome: Progressing Goal: Verbalization of understanding the information provided will improve Outcome: Progressing   Problem: Self-Concept: Goal: Level of anxiety will decrease Outcome: Progressing

## 2019-03-27 NOTE — Plan of Care (Signed)
  Problem: Education: Goal: Utilization of techniques to improve thought processes will improve Outcome: Progressing Goal: Knowledge of the prescribed therapeutic regimen will improve Outcome: Progressing   D: Patient has been a little more anxious due to disruption on the milieu from a new admission. Denies SI, HI and AVH. Medication compliant. Out of her room more. Mood is anxious and sad. Affect is anxious. Medicated for anxiety per prn order with relief. A: Continue to monitor for safety. R: Safety maintained.

## 2019-03-27 NOTE — Progress Notes (Signed)
Recreation Therapy Notes   Date: 03/27/2019  Time: 9:30 am   Location: Craft room   Behavioral response: N/A   Intervention Topic: Necessities   Discussion/Intervention: Patient did not attend group.   Clinical Observations/Feedback:  Patient did not attend group.   Zana Biancardi LRT/CTRS        Havish Petties 03/27/2019 10:32 AM

## 2019-03-27 NOTE — Progress Notes (Signed)
D: Patient has been a little more anxious due to disruption on the milieu from a new admission. Denies SI, HI and AVH. Medication compliant. Out of her room more. Mood is anxious and sad. Affect is anxious. Medicated for anxiety per prn order with relief. A: Continue to monitor for safety. R: Safety maintained.

## 2019-03-27 NOTE — Progress Notes (Signed)
[facility] MD Progress Note  03/27/2019 1:03 PM Jeanette Rose  MRN:  [id number] Subjective: Follow-up for this patient with depression and anxiety.  Patient seen chart reviewed.  Patient looks better than she did yesterday.  Not nearly as panicky.  Not crying not talking about being terrified.  Still full of nervousness and worry about all sorts of things that she either cannot change or that it is too early to address.  Tried to do some supportive counseling with her.  She does have a new complaint today of a pain in her right jaw from what seems like a toothache.  The swelling is visible even outside of her face.  Not reporting any side effects from medication however not suicidal does not appear to be psychotic.  Her interaction would suggest that short-term memory is at least adequate to daily functioning. Principal Problem: Severe recurrent major depression without psychotic features ([place]) Diagnosis: Principal Problem:   Severe recurrent major depression without psychotic features ([place]) Active Problems:   Type 2 diabetes mellitus with diabetic polyneuropathy, with long-term current use of insulin (HCC)   GERD (gastroesophageal reflux disease)   Hypertension   Obesity (BMI 30-39.9)   Constipation   History of hypothyroidism   Major depression  Total Time spent with patient: 30 minutes  Past Psychiatric History: Past history of depression current episode of ECT  Past Medical History:  Past Medical History:  Diagnosis Date  . Acute acalculous cholecystitis s/p lap chole 04/03/2014 04/02/2014  . Allergy   . Anemia   . Anxiety   . Asthma   . Depression   . Fatty liver    noted on CT per [facility] records  . Fibromyalgia   . GERD (gastroesophageal reflux disease)   . Hyperlipidemia associated with type 2 diabetes mellitus ([place])   . Hypertension   . Hypothyroid 2015   Transiently in 2015. Synthroid 90mg was stopped 05/07/2016 w/ tsh nml following.  . Obesity (BMI 30-39.9)   . OSA (obstructive  sleep apnea)    non-compliant with CPAP  . Seborrheic dermatitis    [facility] face and scalp[facility]  . Stroke ([facility]    TIA  . [place] 2 [place]     Past Surgical History:  Procedure Laterality Date  . BREAST REDUCTION SURGERY  06/2008  . CHOLECYSTECTOMY N/A 04/03/2014   Procedure: LAPAROSCOPIC CHOLECYSTECTOMY ;  Surgeon: SAdin Hector MD;  Location: [facility] ORS;  Service: General;  Laterality: N/A;  . REDUCTION MAMMAPLASTY    . TUBAL LIGATION     Family History:  Family History  Problem Relation Age of Onset  . Atrial fibrillation Mother   . Heart disease Father        CAD  . Diabetes Brother   . Cancer Maternal Grandmother   . Diabetes Paternal Grandmother   . Fibromyalgia Sister   . Colitis Sister    Family Psychiatric  History: None Social History:  Social History   Substance and Sexual Activity  Alcohol Use No     Social History   Substance and Sexual Activity  Drug Use No    Social History   Socioeconomic History  . Marital status: Divorced    Spouse name: Not on file  . Number of children: Not on file  . Years of education: Not on file  . Highest education level: Not on file  Occupational History  . Not on file  Social Needs  . Financial resource strain: Not on file  .  Food insecurity    Worry: Not on file    Inability: Not on file  . Transportation needs    Medical: Not on file    Non-medical: Not on file  Tobacco Use  . Smoking status: Former Smoker    Packs/day: 4.00    Years: 15.00    Pack years: 60.00    Types: Cigarettes    Quit date: 04/22/2004    Years since quitting: 14.9  . Smokeless tobacco: Never Used  . Tobacco comment: 4 pack year hx  Substance and Sexual Activity  . Alcohol use: No  . Drug use: No  . Sexual activity: Never  Lifestyle  . Physical activity    Days per week: Not on file    Minutes per session: Not on file  . Stress: Not on file  Relationships  . Social Herbalist on  phone: Not on file    Gets together: Not on file    Attends religious service: Not on file    Active member of club or organization: Not on file    Attends meetings of clubs or organizations: Not on file    Relationship status: Not on file  Other Topics Concern  . Not on file  Social History Narrative  . Not on file   Additional Social History:                         Sleep: Fair  Appetite:  Fair  Current Medications: Current Facility-Administered Medications  Medication Dose Route Frequency Provider Last Rate Last Dose  . acetaminophen (TYLENOL) tablet 650 mg  650 mg Oral Q6H PRN , Madie Reno, MD   650 mg at 03/26/19 2015  . alum & mag hydroxide-simeth (MAALOX/MYLANTA) 200-200-20 MG/5ML suspension 30 mL  30 mL Oral Q4H PRN ,  T, MD      . amoxicillin (AMOXIL) capsule 500 mg  500 mg Oral Q12H ,  T, MD      . aspirin EC tablet 81 mg  81 mg Oral Daily , Madie Reno, MD   81 mg at 03/27/19 0818  . atorvastatin (LIPITOR) tablet 80 mg  80 mg Oral q1800 ,  T, MD   80 mg at 03/26/19 1722  . busPIRone (BUSPAR) tablet 15 mg  15 mg Oral TID Sharma Covert, MD   15 mg at 03/27/19 1154  . docusate sodium (COLACE) capsule 100 mg  100 mg Oral BID , Madie Reno, MD   100 mg at 03/27/19 0817  . [START ON 03/28/2019] DULoxetine (CYMBALTA) DR capsule 20 mg  20 mg Oral Daily ,  T, MD      . Derrill Memo ON 03/28/2019] escitalopram (LEXAPRO) tablet 10 mg  10 mg Oral Daily ,  T, MD      . feeding supplement (ENSURE ENLIVE) (ENSURE ENLIVE) liquid 237 mL  237 mL Oral BID BM ,  T, MD   237 mL at 03/26/19 1941  . fentaNYL (SUBLIMAZE) injection 25 mcg  25 mcg Intravenous Q5 min PRN Gunnar Bulla, MD      . glycopyrrolate (ROBINUL) injection 0.1 mg  0.1 mg Intravenous Once ,  T, MD      . glycopyrrolate (ROBINUL) injection 0.2 mg  0.2 mg Intravenous Once ,  T, MD      . hydrocortisone cream 1 %   Topical BID  PRN , Madie Reno, MD   1 application at 11/55/20 1601  . hydrOXYzine (  ATARAX/VISTARIL) tablet 50 mg  50 mg Oral Q6H PRN , Madie Reno, MD   50 mg at 03/26/19 1852  . ibuprofen (ADVIL) tablet 600 mg  600 mg Oral Q6H PRN ,  T, MD   600 mg at 03/27/19 1019  . insulin aspart (novoLOG) injection 0-15 Units  0-15 Units Subcutaneous TID WC , Madie Reno, MD   3 Units at 03/27/19 1154  . irbesartan (AVAPRO) tablet 150 mg  150 mg Oral Daily , Madie Reno, MD   150 mg at 03/27/19 0818  . ketorolac (TORADOL) 30 MG/ML injection 30 mg  30 mg Intravenous Once ,  T, MD      . LORazepam (ATIVAN) tablet 1 mg  1 mg Oral Q8H PRN , Madie Reno, MD   1 mg at 03/23/19 2130  . LORazepam (ATIVAN) tablet 1 mg  1 mg Oral BID , Madie Reno, MD   1 mg at 03/27/19 0818  . magnesium hydroxide (MILK OF MAGNESIA) suspension 30 mL  30 mL Oral Daily PRN , Madie Reno, MD   30 mL at 03/24/19 1337  . menthol-cetylpyridinium (CEPACOL) lozenge 3 mg  1 lozenge Oral PRN ,  T, MD      . metFORMIN (GLUCOPHAGE-XR) 24 hr tablet 1,000 mg  1,000 mg Oral BID WC ,  T, MD   1,000 mg at 03/27/19 0818  . midazolam (VERSED) injection 2 mg  2 mg Intravenous Once ,  T, MD      . midazolam (VERSED) injection 2 mg  2 mg Intravenous Once ,  T, MD      . multivitamin with minerals tablet 1 tablet  1 tablet Oral Daily , Madie Reno, MD   1 tablet at 03/27/19 [phone]  . ondansetron (ZOFRAN) injection 4 mg  4 mg Intravenous Once ,  T, MD      . ondansetron [facility]) injection 4 mg  4 mg Intravenous Once ,  T, MD      . ondansetron [facility]) injection 4 mg  4 mg Intravenous Once PRN Gunnar Bulla, MD      . pantoprazole (PROTONIX) EC tablet 40 mg  40 mg Oral Honor Junes, Madie Reno, MD   40 mg at 03/27/19 6384  . polyethylene glycol (MIRALAX / GLYCOLAX) packet 17 g  17 g Oral Daily Sharma Covert, MD      . senna-docusate (Senokot-S) tablet 1 tablet  1  tablet Oral QHS Sharma Covert, MD   1 tablet at 03/26/19 2108  . sodium phosphate (FLEET) 7-19 GM/118ML enema 1 enema  1 enema Rectal Daily PRN Sharma Covert, MD      . thiamine (VITAMIN B-1) tablet 100 mg  100 mg Oral Daily , Madie Reno, MD   100 mg at 03/27/19 0818    Lab Results:  Results for orders placed or performed during the hospital encounter of 03/15/19 (from the past 48 hour(s))  Glucose, capillary     Status: Abnormal   Collection Time: 03/25/19  4:00 PM  Result Value Ref Range   Glucose-Capillary 137 (H) 70 - 99 mg/dL   Comment 1 Notify RN   Glucose, capillary     Status: Abnormal   Collection Time: 03/25/19  9:04 PM  Result Value Ref Range   Glucose-Capillary 155 (H) 70 - 99 mg/dL   Comment 1 Notify RN   Glucose, capillary     Status: Abnormal   Collection Time: 03/26/19  6:24 AM  Result Value Ref Range  Glucose-Capillary 156 (H) 70 - 99 mg/dL  Glucose, capillary     Status: Abnormal   Collection Time: 03/26/19 12:00 PM  Result Value Ref Range   Glucose-Capillary 142 (H) 70 - 99 mg/dL  Glucose, capillary     Status: Abnormal   Collection Time: 03/26/19  1:43 PM  Result Value Ref Range   Glucose-Capillary 153 (H) 70 - 99 mg/dL  Glucose, capillary     Status: Abnormal   Collection Time: 03/26/19  4:22 PM  Result Value Ref Range   Glucose-Capillary 202 (H) 70 - 99 mg/dL  Glucose, capillary     Status: Abnormal   Collection Time: 03/26/19  8:18 PM  Result Value Ref Range   Glucose-Capillary 165 (H) 70 - 99 mg/dL   Comment 1 Notify RN   Glucose, capillary     Status: Abnormal   Collection Time: 03/27/19  6:59 AM  Result Value Ref Range   Glucose-Capillary 196 (H) 70 - 99 mg/dL  Glucose, capillary     Status: Abnormal   Collection Time: 03/27/19 11:07 AM  Result Value Ref Range   Glucose-Capillary 163 (H) 70 - 99 mg/dL   Comment 1 Notify RN     Blood Alcohol level:  Lab Results  Component Value Date   ETH <10 33/00/7622    Metabolic  Disorder Labs: Lab Results  Component Value Date   HGBA1C 6.3 (H) 03/09/2019   MPG 134.11 03/09/2019   MPG 180 (H) 04/03/2014   No results found for: PROLACTIN Lab Results  Component Value Date   CHOL 174 10/09/2018   TRIG 210 (H) 10/09/2018   HDL 44 10/09/2018   CHOLHDL 4.0 10/09/2018   LDLCALC 88 10/09/2018   LDLCALC 77 06/28/2018    Physical Findings: AIMS:  , ,  ,  ,    CIWA:    COWS:     Musculoskeletal: Strength & Muscle Tone: within normal limits Gait & Station: normal Patient leans: N/A  Psychiatric Specialty Exam: Physical Exam  Nursing note and vitals reviewed. Constitutional: She appears well-developed and well-nourished.  HENT:  Head: Normocephalic and atraumatic.  Eyes: Pupils are equal, round, and reactive to light. Conjunctivae are normal.  Neck: Normal range of motion.  Cardiovascular: Regular rhythm and normal heart sounds.  Respiratory: Effort normal. No respiratory distress.  GI: Soft.  Musculoskeletal: Normal range of motion.  Neurological: She is alert.  Skin: Skin is warm and dry.  Psychiatric: Her mood appears anxious. Her speech is tangential. She is agitated. She is not aggressive. She exhibits abnormal recent memory.    Review of Systems  Constitutional: Negative.   HENT: Negative.        Tooth ache in the right lower jaw.  Swelling visible even on the outside of the cheek.  Eyes: Negative.   Respiratory: Negative.   Cardiovascular: Negative.   Gastrointestinal: Negative.   Musculoskeletal: Negative.   Skin: Negative.   Neurological: Negative.   Psychiatric/Behavioral: Positive for memory loss. The patient is nervous/anxious.     Blood pressure 134/69, pulse (!) 109, temperature 98 F (36.7 C), temperature source Oral, resp. rate 18, height 5' 4"  (1.626 m), weight 72.6 kg, SpO2 98 %.Body mass index is 27.46 kg/m.  General Appearance: Casual  Eye Contact:  Fair  Speech:  Clear and Coherent  Volume:  Increased  Mood:  Anxious   Affect:  Congruent  Thought Process:  Coherent  Orientation:  Full (Time, Place, and Person)  Thought Content:  Tangential  Suicidal Thoughts:  No  Homicidal Thoughts:  No  Memory:  Immediate;   Fair Recent;   Fair Remote;   Fair  Judgement:  Fair  Insight:  Fair  Psychomotor Activity:  Decreased  Concentration:  Concentration: Fair  Recall:  [employer] of Knowledge:  Fair  Language:  Fair  Akathisia:  No  Handed:  Right  AIMS (if indicated):     Assets:  Desire for Improvement Housing Physical Health  ADL's:  Intact  Cognition:  Impaired,  Mild  Sleep:  Number of Hours: 6.5     Treatment Plan Summary: Daily contact with patient to assess and evaluate symptoms and progress in treatment, Medication management and Plan Patient with depression and anxiety back on course looking slightly improved.  Plan is for ECT tomorrow and meanwhile I am increasing the Lexapro up to 10 mg while cutting her Cymbalta down to 20.  Continue other medicine as prescribed.  I am going to add some amoxicillin and she has higher dose Motrin for the toothache.  Reassured patient and did some psychoeducation.  Possible discharge later in this week depending on how she is doing after tomorrow.  Alethia Berthold, MD 03/27/2019, 1:03 PM

## 2019-03-27 NOTE — [facility] Group Notes (Signed)
Feelings Around Diagnosis 03/27/2019 1PM  Type of Therapy/Topic:  Group Therapy:  Feelings about Diagnosis  Participation Level:  Did Not Attend   Description of Group:   This group will allow patients to explore their thoughts and feelings about diagnoses they have received. Patients will be guided to explore their level of understanding and acceptance of these diagnoses. Facilitator will encourage patients to process their thoughts and feelings about the reactions of others to their diagnosis and will guide patients in identifying ways to discuss their diagnosis with significant others in their lives. This group will be process-oriented, with patients participating in exploration of their own experiences, giving and receiving support, and processing challenge from other group members.   Therapeutic Goals: 1. Patient will demonstrate understanding of diagnosis as evidenced by identifying two or more symptoms of the disorder 2. Patient will be able to express two feelings regarding the diagnosis 3. Patient will demonstrate their ability to communicate their needs through discussion and/or role play  Summary of Patient Progress:       Therapeutic Modalities:   Cognitive Behavioral Therapy Brief Therapy Feelings Identification    Yvette Rack, LCSW 03/27/2019 1:33 PM

## 2019-03-28 ENCOUNTER — Inpatient Hospital Stay: Payer: BLUE CROSS/BLUE SHIELD | Admitting: Anesthesiology

## 2019-03-28 ENCOUNTER — Encounter: Payer: Self-pay | Admitting: *Deleted

## 2019-03-28 DIAGNOSIS — F329 Major depressive disorder, single episode, unspecified: Secondary | ICD-10-CM | POA: Diagnosis not present

## 2019-03-28 LAB — GLUCOSE, CAPILLARY
Glucose-Capillary: 169 mg/dL — ABNORMAL HIGH (ref 70–99)
Glucose-Capillary: 146 mg/dL — ABNORMAL HIGH (ref 70–99)
Glucose-Capillary: 134 mg/dL — ABNORMAL HIGH (ref 70–99)

## 2019-03-28 MED ORDER — GLYCOPYRROLATE 0.2 MG/ML IJ SOLN
0.1000 mg | Freq: Once | INTRAMUSCULAR | Status: AC
  Administered 2019-03-28: 0.1 mg via INTRAVENOUS

## 2019-03-28 MED ORDER — METHOHEXITAL SODIUM 100 MG/10ML IV SOSY
PREFILLED_SYRINGE | INTRAVENOUS | Status: DC | PRN
  Administered 2019-03-28: 80 mg via INTRAVENOUS

## 2019-03-28 MED ORDER — SUCCINYLCHOLINE CHLORIDE 20 MG/ML IJ SOLN
INTRAMUSCULAR | Status: AC
  Filled 2019-03-28: qty 1

## 2019-03-28 MED ORDER — SODIUM CHLORIDE 0.9 % IV SOLN
500.0000 mL | Freq: Once | INTRAVENOUS | Status: AC
  Administered 2019-03-28: 500 mL via INTRAVENOUS

## 2019-03-28 MED ORDER — SUCCINYLCHOLINE CHLORIDE 200 MG/10ML IV SOSY
PREFILLED_SYRINGE | INTRAVENOUS | Status: DC | PRN
  Administered 2019-03-28: 100 mg via INTRAVENOUS

## 2019-03-28 MED ORDER — KETOROLAC TROMETHAMINE 30 MG/ML IJ SOLN
30.0000 mg | Freq: Once | INTRAMUSCULAR | Status: AC
  Administered 2019-03-28: 30 mg via INTRAVENOUS

## 2019-03-28 MED ORDER — ONDANSETRON HCL 4 MG/2ML IJ SOLN: 4.0000 mg | Freq: Once | INTRAMUSCULAR | Status: DC | PRN

## 2019-03-28 MED ORDER — SODIUM CHLORIDE 0.9 % IV SOLN
Freq: Once | INTRAVENOUS | Status: AC
  Administered 2019-03-28: 09:00:00 via INTRAVENOUS

## 2019-03-28 MED ORDER — GLYCOPYRROLATE 0.2 MG/ML IJ SOLN
INTRAMUSCULAR | Status: AC
  Administered 2019-03-28: 0.1 mg via INTRAVENOUS
  Filled 2019-03-28: qty 1

## 2019-03-28 MED ORDER — KETOROLAC TROMETHAMINE 30 MG/ML IJ SOLN
INTRAMUSCULAR | Status: AC
  Administered 2019-03-28: 30 mg via INTRAVENOUS
  Filled 2019-03-28: qty 1

## 2019-03-28 MED ORDER — MIDAZOLAM HCL 2 MG/2ML IJ SOLN: 2.0000 mg | Freq: Once | INTRAMUSCULAR | Status: DC

## 2019-03-28 MED ORDER — MIDAZOLAM HCL 2 MG/2ML IJ SOLN
INTRAMUSCULAR | Status: AC
  Filled 2019-03-28: qty 2

## 2019-03-28 NOTE — Plan of Care (Signed)
  Problem: Coping: Goal: Will verbalize feelings Outcome: Progressing  Patient verbalized feelings to patient to staff' ECT

## 2019-03-28 NOTE — H&P (Signed)
Jeanette Rose is an 63 y.o. female.   Chief Complaint: anxiety still present  HPI: recurrent depression  Past Medical History:  Diagnosis Date  . Acute acalculous cholecystitis s/p lap chole 04/03/2014 04/02/2014  . Allergy   . Anemia   . Anxiety   . Asthma   . Depression   . Fatty liver    noted on CT per [facility] records  . Fibromyalgia   . GERD (gastroesophageal reflux disease)   . Hyperlipidemia associated with type 2 diabetes mellitus ([place])   . Hypertension   . Hypothyroid 2015   Transiently in 2015. Synthroid 46mg was stopped 05/07/2016 w/ tsh nml following.  . Obesity (BMI 30-39.9)   . OSA (obstructive sleep apnea)    non-compliant with CPAP  . Seborrheic dermatitis    [facility]  . Stroke ([facility]    TIA  . Type 2 diabetes mellitus ([place]     Past Surgical History:  Procedure Laterality Date  . BREAST REDUCTION SURGERY  06/2008  . CHOLECYSTECTOMY N/A 04/03/2014   Procedure: LAPAROSCOPIC CHOLECYSTECTOMY ;  Surgeon: SAdin Hector MD;  Location: [facility] ORS;  Service: General;  Laterality: N/A;  . REDUCTION MAMMAPLASTY    . TUBAL LIGATION      Family History  Problem Relation Age of Onset  . Atrial fibrillation Mother   . Heart disease Father        CAD  . Diabetes Brother   . Cancer Maternal Grandmother   . Diabetes Paternal Grandmother   . Fibromyalgia Sister   . Colitis Sister    Social History:  reports that she quit smoking about 14 years ago. Her smoking use included cigarettes. She has a 60.00 pack-year smoking history. She has never used smokeless tobacco. She reports that she does not drink alcohol or use drugs.  Allergies:  Allergies  Allergen Reactions  . Farxiga [Dapagliflozin] Anxiety and Other (See Comments)    Made depression and anxiety worse  . Rexulti [Brexpiprazole] Anxiety and Other (See Comments)    Made anxiety worse  . Trintellix [Vortioxetine] Anxiety and Other (See Comments)    Made anxiety worse     Medications Prior to Admission  Medication Sig Dispense Refill  . aspirin (ASPIRIN LOW DOSE) 81 MG tablet Take 81 mg by mouth daily.     .Marland Kitchenatorvastatin (LIPITOR) 80 MG tablet TAKE 1 TABLET (80 MG TOTAL) BY MOUTH DAILY AT 6 PM. 90 tablet 0  . DULoxetine (CYMBALTA) 60 MG capsule Take 1 capsule (60 mg total) by mouth daily. 1 capsule 0  . feeding supplement, ENSURE ENLIVE, (ENSURE ENLIVE) LIQD Take 237 mLs by mouth 2 (two) times daily between meals. 1 mL 0  . hydrOXYzine (ATARAX/VISTARIL) 25 MG tablet Take 1 tablet (25 mg total) by mouth 3 (three) times daily as needed for anxiety. 1 tablet 0  . irbesartan (AVAPRO) 150 MG tablet Take 1 tablet (150 mg total) by mouth daily. 1 tablet 0  . LORazepam (ATIVAN) 0.5 MG tablet Take 3 tablets (1.5 mg total) by mouth at bedtime. 1 tablet 0  . LORazepam (ATIVAN) 1 MG tablet Take 1 tablet (1 mg total) by mouth every 8 (eight) hours as needed (withdrawal symptoms). 1 tablet 0  . metFORMIN (FORTAMET) 1000 MG (OSM) 24 hr tablet Take 1 tablet (1,000 mg total) by mouth 2 (two) times daily with a meal. 1 tablet 0  . Multiple Vitamin (MULTIVITAMIN WITH MINERALS) TABS tablet Take 1 tablet by mouth daily.  1 tablet 0  . nitrofurantoin, macrocrystal-monohydrate, (MACROBID) 100 MG capsule Take 1 capsule (100 mg total) by mouth every 12 (twelve) hours. 5 capsule 0  . nortriptyline (PAMELOR) 25 MG capsule Take 1 capsule (25 mg total) by mouth at bedtime. 1 capsule 0  . nortriptyline (PAMELOR) 25 MG capsule Take 1 capsule (25 mg total) by mouth daily. 1 capsule 0  . Oxcarbazepine (TRILEPTAL) 300 MG tablet Take 1 tablet (300 mg total) by mouth 2 (two) times daily. 1 tablet 0  . pantoprazole (PROTONIX) 40 MG tablet Take 1 tablet (40 mg total) by mouth every morning. 1 tablet 0  . polyethylene glycol (MIRALAX / GLYCOLAX) 17 g packet Take 17 g by mouth daily as needed for mild constipation or moderate constipation. 1 each 0  . thiamine 100 MG tablet Take 1 tablet (100 mg  total) by mouth daily. 1 tablet 0    Results for orders placed or performed during the hospital encounter of 03/15/19 (from the past 48 hour(s))  Glucose, capillary     Status: Abnormal   Collection Time: 03/26/19 12:00 PM  Result Value Ref Range   Glucose-Capillary 142 (H) 70 - 99 mg/dL  Glucose, capillary     Status: Abnormal   Collection Time: 03/26/19  1:43 PM  Result Value Ref Range   Glucose-Capillary 153 (H) 70 - 99 mg/dL  Glucose, capillary     Status: Abnormal   Collection Time: 03/26/19  4:22 PM  Result Value Ref Range   Glucose-Capillary 202 (H) 70 - 99 mg/dL  Glucose, capillary     Status: Abnormal   Collection Time: 03/26/19  8:18 PM  Result Value Ref Range   Glucose-Capillary 165 (H) 70 - 99 mg/dL   Comment 1 Notify RN   Glucose, capillary     Status: Abnormal   Collection Time: 03/27/19  6:59 AM  Result Value Ref Range   Glucose-Capillary 196 (H) 70 - 99 mg/dL  Glucose, capillary     Status: Abnormal   Collection Time: 03/27/19 11:07 AM  Result Value Ref Range   Glucose-Capillary 163 (H) 70 - 99 mg/dL   Comment 1 Notify RN   Glucose, capillary     Status: Abnormal   Collection Time: 03/27/19  4:35 PM  Result Value Ref Range   Glucose-Capillary 130 (H) 70 - 99 mg/dL   No results found.  Review of Systems  Constitutional: Negative.   HENT: Negative.   Eyes: Negative.   Respiratory: Negative.   Cardiovascular: Negative.   Gastrointestinal: Negative.   Musculoskeletal: Negative.   Skin: Negative.   Neurological: Negative.   Psychiatric/Behavioral: Positive for memory loss. Negative for depression and suicidal ideas. The patient is nervous/anxious.     Blood pressure (!) 161/74, pulse 96, temperature 98.5 F (36.9 C), temperature source Oral, resp. rate 16, height 5' 4"  (1.626 m), weight 76.2 kg, SpO2 98 %. Physical Exam  Nursing note and vitals reviewed. Constitutional: She appears well-developed and well-nourished.  HENT:  Head: Normocephalic and  atraumatic.  Eyes: Pupils are equal, round, and reactive to light. Conjunctivae are normal.  Neck: Normal range of motion.  Cardiovascular: Regular rhythm and normal heart sounds.  Respiratory: Effort normal.  GI: Soft.  Musculoskeletal: Normal range of motion.  Neurological: She is alert.  Skin: Skin is warm and dry.  Psychiatric: Judgment normal. Her mood appears anxious. Her speech is delayed. She is slowed. She expresses no homicidal and no suicidal ideation. She exhibits abnormal recent memory.  Assessment/Plan Last ECT today. Likely DC fri  Alethia Berthold, MD 03/28/2019, 10:10 AM

## 2019-03-28 NOTE — Addendum Note (Signed)
Addendum  created 03/28/19 1150 by Gunnar Fusi, MD   Clinical Note Signed

## 2019-03-28 NOTE — Plan of Care (Signed)
Patient attending unit programing , able to verbalize feelings . Emotional and mental status  improved  Patient continue to go to ECT therapy , improvement / positive change noted . Continue to work on  coping , decision making and anxiety  concerns. Denies suicidal ideations   . Patient verbalize understanding  with information received    Problem: Self-Concept: Goal: Level of anxiety will decrease Outcome: Progressing   Problem: Education: Goal: Knowledge of [place] [facility] Education information/materials will improve Outcome: Progressing Goal: Emotional status will improve Outcome: Progressing Goal: Mental status will improve Outcome: Progressing Goal: Verbalization of understanding the information provided will improve Outcome: Progressing   Problem: Self-Concept: Goal: Will verbalize positive feelings about self Outcome: Progressing Goal: Level of anxiety will decrease Outcome: Progressing   Problem: Safety: Goal: Ability to disclose and discuss suicidal ideas will improve Outcome: Progressing Goal: Ability to identify and utilize support systems that promote safety will improve Outcome: Progressing   Problem: Role Relationship: Goal: Will demonstrate positive changes in social behaviors and relationships Outcome: Progressing   Problem: Health Behavior/Discharge Planning: Goal: Ability to make decisions will improve Outcome: Progressing Goal: Compliance with therapeutic regimen will improve Outcome: Progressing   Problem: Coping: Goal: Coping ability will improve Outcome: Progressing Goal: Will verbalize feelings Outcome: Progressing   Problem: Activity: Goal: Interest or engagement in leisure activities will improve Outcome: Progressing Goal: Imbalance in normal sleep/wake cycle will improve Outcome: Progressing   Problem: Education: Goal: Utilization of techniques to improve thought processes will improve Outcome: Progressing Goal: Knowledge of the  prescribed therapeutic regimen will improve Outcome: Progressing

## 2019-03-28 NOTE — Anesthesia Post-op Follow-up Note (Signed)
Anesthesia QCDR form completed.        

## 2019-03-28 NOTE — Plan of Care (Signed)
  Problem: Education: Goal: Emotional status will improve Outcome: Progressing  Emotions are better, she appears less anxious , interacting appropriately with peers and staff.

## 2019-03-28 NOTE — Progress Notes (Signed)
Recreation Therapy Notes    Date: 03/28/2019  Time: 9:30 am   Location: Craft room   Behavioral response: N/A   Intervention Topic: Self-esteem  Discussion/Intervention: Patient did not attend group.   Clinical Observations/Feedback:  Patient did not attend group.   Allene Furuya LRT/CTRS         Akeel Reffner 03/28/2019 10:45 AM

## 2019-03-28 NOTE — Progress Notes (Signed)
Patient alert and oriented x 4, affect is flat but she brightens upon approach, she's  pleasant her thoughts are organized and coherent, she appears less anxious states ECT is helping me.  Patientdenies SI/HI/AVHand rateddepression a4/10 ( 0 low - 10 high ) patient is complaint with medication. Emotional support and encouragement given to patients he attended evening wrap up group. Patient attends group and was appropriate, 15 minutes safety checksmaintained will continue to monitor.

## 2019-03-28 NOTE — Progress Notes (Signed)
D: Patient stated slept fair  last night .Stated appetite  good and energy level low  Stated concentration poor. Stated on Depression scale 0 , hopeless 0 and anxiety 0 .( low 0-10 high) Denies suicidal  homicidal ideations  .  No auditory hallucinations  No pain concerns . Appropriate ADL'S. Interacting with peers and staff. Patient attending unit programing , able to verbalize feelings . Emotional and mental status  improved  Patient continue to go to ECT therapy , improvement / positive change noted . Continue to work on  coping , decision making and anxiety  concerns. Denies suicidal ideations   . Patient verbalize understanding  with information received   A: Encourage patient participation with unit programming . Instruction  Given on  Medication , verbalize understanding.  R: Voice no other concerns. Staff continue to monitor

## 2019-03-28 NOTE — [facility] Group Notes (Signed)
[place] Group Notes:  (Nursing/MHT/Case Management/Adjunct)  Date:  03/28/2019  Time:  8:43 PM  Type of Therapy:  Group Therapy  Participation Level:  Did Not Attend   Jeanette Rose 03/28/2019, 8:43 PM

## 2019-03-28 NOTE — Anesthesia Preprocedure Evaluation (Addendum)
Anesthesia Evaluation  Patient identified by MRN, date of birth, ID band Patient awake    Reviewed: Allergy & Precautions, H&P , NPO status , Patient's Chart, lab work & pertinent test results  Airway Mallampati: III  TM Distance: >3 FB Neck ROM: full    Dental  (+) Chipped, Poor Dentition, Missing   Pulmonary neg shortness of breath, asthma , sleep apnea , former smoker,           Cardiovascular Exercise Tolerance: Good hypertension, (-) angina(-) Past MI      Neuro/Psych PSYCHIATRIC DISORDERS Anxiety Depression  Neuromuscular disease CVA, Residual Symptoms negative psych ROS   GI/Hepatic GERD  Medicated and Controlled,(+) Hepatitis -  Endo/Other  diabetes, Type 2, Insulin DependentHypothyroidism   Renal/GU negative Renal ROS  negative genitourinary   Musculoskeletal   Abdominal   Peds  Hematology negative hematology ROS (+)   Anesthesia Other Findings   Reproductive/Obstetrics negative OB ROS                             Anesthesia Physical  Anesthesia Plan  ASA: III  Anesthesia Plan: General   Post-op Pain Management:    Induction: Intravenous  PONV Risk Score and Plan:   Airway Management Planned: Mask  Additional Equipment:   Intra-op Plan:   Post-operative Plan:   Informed Consent: I have reviewed the patients History and Physical, chart, labs and discussed the procedure including the risks, benefits and alternatives for the proposed anesthesia with the patient or authorized representative who has indicated his/her understanding and acceptance.       Plan Discussed with: Anesthesiologist, CRNA and Surgeon  Anesthesia Plan Comments:        Anesthesia Quick Evaluation

## 2019-03-28 NOTE — Anesthesia Postprocedure Evaluation (Signed)
Anesthesia Post Note  Patient: Jeanette Rose  Procedure(s) Performed: ECT TX  Patient location during evaluation: PACU Anesthesia Type: General Level of consciousness: sedated Pain management: pain level controlled Vital Signs Assessment: post-procedure vital signs reviewed and stable Respiratory status: spontaneous breathing and respiratory function stable Cardiovascular status: stable Anesthetic complications: no     Last Vitals:  Vitals:   03/28/19 1047 03/28/19 1050  BP:  (!) 147/76  Pulse: 88 80  Resp: (!) 21 17  Temp: 36.9 C   SpO2: 100% 95%    Last Pain:  Vitals:   03/28/19 1047  TempSrc:   PainSc: 0-No pain                 KEPHART,WILLIAM K

## 2019-03-28 NOTE — Procedures (Signed)
ECT SERVICES Physician's Interval Evaluation & Treatment Note  Patient Identification: Jeanette Rose MRN:  [id number] Date of Evaluation:  03/28/2019 TX #: [id number]  MADRS:   MMSE:   P.E. Findings:  No change  Psychiatric Interval Note:  anxious  Subjective:  Patient is a 63 y.o. female seen for evaluation for Electroconvulsive Therapy. Memory impairment  Treatment Summary:   [x]   Right Unilateral             []  Bilateral   % Energy : 0.41m,60%   Impedance: 2590 ohms  Seizure Energy Index: na  Postictal Suppression Index: na  Seizure Concordance Index: 25%  Medications  Pre Shock: rob 0.1,zof 4, tor 30, brev 80, suc 100  Post Shock: ver2  Seizure Duration: 22s emg 60s eeg   Comments: Last ECT   Lungs:  [x]   Clear to auscultation               []  Other:   Heart:    [x]   Regular rhythm             []  irregular rhythm    [x]   Previous H&P reviewed, patient examined and there are NO CHANGES                 []   Previous H&P reviewed, patient examined and there are changes noted.   JAlethia Berthold MD 7/1/202010:12 AM

## 2019-03-28 NOTE — [facility] Group Notes (Signed)
LCSW Group Therapy Note  03/28/2019 1:00 PM  Type of Therapy/Topic:  Group Therapy:  Emotion Regulation  Participation Level:  Did Not Attend   Description of Group:   The purpose of this group is to assist patients in learning to regulate negative emotions and experience positive emotions. Patients will be guided to discuss ways in which they have been vulnerable to their negative emotions. These vulnerabilities will be juxtaposed with experiences of positive emotions or situations, and patients will be challenged to use positive emotions to combat negative ones. Special emphasis will be placed on coping with negative emotions in conflict situations, and patients will process healthy conflict resolution skills.  Therapeutic Goals: 1. Patient will identify two positive emotions or experiences to reflect on in order to balance out negative emotions 2. Patient will label two or more emotions that they find the most difficult to experience 3. Patient will demonstrate positive conflict resolution skills through discussion and/or role plays  Summary of Patient Progress: X  Therapeutic Modalities:   Cognitive Behavioral Therapy Feelings Identification Dialectical Behavioral Therapy  Jeanette Rose, MSW, LCSW 03/28/2019 1:00 PM

## 2019-03-28 NOTE — Transfer of Care (Signed)
Immediate Anesthesia Transfer of Care Note  Patient: Jeanette Rose  Procedure(s) Performed: ECT TX  Patient Location: PACU  Anesthesia Type:General  Level of Consciousness: sedated  Airway & Oxygen Therapy: Patient Spontanous Breathing and Patient connected to face mask oxygen  Post-op Assessment: Report given to RN and Post -op Vital signs reviewed and stable  Post vital signs: Reviewed and stable  Last Vitals:  Vitals Value Taken Time  BP 147/94 03/28/19 1030  Temp    Pulse 102 03/28/19 1030  Resp 17 03/28/19 1030  SpO2 96 % 03/28/19 1030  Vitals shown include unvalidated device data.  Last Pain:  Vitals:   03/28/19 0937  TempSrc:   PainSc: 0-No pain      Patients Stated Pain Goal: 4 (72/89/79 1504)  Complications: No apparent anesthesia complications

## 2019-03-28 NOTE — Anesthesia Procedure Notes (Signed)
Date/Time: 03/28/2019 10:20 AM Performed by: Dionne Bucy, CRNA Pre-anesthesia Checklist: Patient identified, Emergency Drugs available, Suction available and Patient being monitored Patient Re-evaluated:Patient Re-evaluated prior to induction Oxygen Delivery Method: Circle system utilized Preoxygenation: Pre-oxygenation with 100% oxygen Induction Type: IV induction Ventilation: Mask ventilation without difficulty and Mask ventilation throughout procedure Airway Equipment and Method: Bite block Placement Confirmation: positive ETCO2 Dental Injury: Teeth and Oropharynx as per pre-operative assessment

## 2019-03-28 NOTE — Progress Notes (Signed)
[facility] MD Progress Note  03/28/2019 3:18 PM Jeanette Rose  MRN:  [id number] Subjective: Follow-up for this patient with depression and anxiety receiving ECT.  Patient had right unilateral ECT this morning without complication.  She is not reporting herself being particularly depressed but continues to be very anxious.  Complains of a lot of memory problems which seem to be in excess of what is subjectively observable.  Patient still gets jittery at times.  No longer reporting any active suicidal ideation.  Tolerating current medicine including the current cross taper of Cymbalta and Lexapro.  Blood pressure under good control blood sugars under adequate control. Principal Problem: Severe recurrent major depression without psychotic features ([place]) Diagnosis: Principal Problem:   Severe recurrent major depression without psychotic features ([place]) Active Problems:   Type 2 diabetes mellitus with diabetic polyneuropathy, with long-term current use of insulin (HCC)   GERD (gastroesophageal reflux disease)   Hypertension   Obesity (BMI 30-39.9)   Constipation   History of hypothyroidism   Major depression  Total Time spent with patient: 30 minutes  Past Psychiatric History: History of depression and anxiety ever since the death of her son.  Past Medical History:  Past Medical History:  Diagnosis Date  . Acute acalculous cholecystitis s/p lap chole 04/03/2014 04/02/2014  . Allergy   . Anemia   . Anxiety   . Asthma   . Depression   . Fatty liver    noted on CT per [facility] records  . Fibromyalgia   . GERD (gastroesophageal reflux disease)   . Hyperlipidemia associated with type 2 diabetes mellitus ([place])   . Hypertension   . Hypothyroid 2015   Transiently in 2015. Synthroid 31mg was stopped 05/07/2016 w/ tsh nml following.  . Obesity (BMI 30-39.9)   . OSA (obstructive sleep apnea)    non-compliant with CPAP  . Seborrheic dermatitis    [facility]  . Stroke  ([facility]    TIA  . Type 2 diabetes mellitus ([place]     Past Surgical History:  Procedure Laterality Date  . BREAST REDUCTION SURGERY  06/2008  . CHOLECYSTECTOMY N/A 04/03/2014   Procedure: LAPAROSCOPIC CHOLECYSTECTOMY ;  Surgeon: SAdin Hector MD;  Location: [facility] ORS;  Service: General;  Laterality: N/A;  . REDUCTION MAMMAPLASTY    . TUBAL LIGATION     Family History:  Family History  Problem Relation Age of Onset  . Atrial fibrillation Mother   . Heart disease Father        CAD  . Diabetes Brother   . Cancer Maternal Grandmother   . Diabetes Paternal Grandmother   . Fibromyalgia Sister   . Colitis Sister    Family Psychiatric  History: See previous Social History:  Social History   Substance and Sexual Activity  Alcohol Use No     Social History   Substance and Sexual Activity  Drug Use No    Social History   Socioeconomic History  . Marital status: Divorced    Spouse name: Not on file  . Number of children: Not on file  . Years of education: Not on file  . Highest education level: Not on file  Occupational History  . Not on file  Social Needs  . Financial resource strain: Not on file  . Food insecurity    Worry: Not on file    Inability: Not on file  . Transportation needs    Medical: Not on file    Non-medical: Not  on file  Tobacco Use  . Smoking status: Former Smoker    Packs/day: 4.00    Years: 15.00    Pack years: 60.00    Types: Cigarettes    Quit date: 04/22/2004    Years since quitting: 14.9  . Smokeless tobacco: Never Used  . Tobacco comment: 4 pack year hx  Substance and Sexual Activity  . Alcohol use: No  . Drug use: No  . Sexual activity: Never  Lifestyle  . Physical activity    Days per week: Not on file    Minutes per session: Not on file  . Stress: Not on file  Relationships  . Social Herbalist on phone: Not on file    Gets together: Not on file    Attends religious service: Not on file    Active member of club or  organization: Not on file    Attends meetings of clubs or organizations: Not on file    Relationship status: Not on file  Other Topics Concern  . Not on file  Social History Narrative  . Not on file   Additional Social History:                         Sleep: Fair  Appetite:  Fair  Current Medications: Current Facility-Administered Medications  Medication Dose Route Frequency Provider Last Rate Last Dose  . acetaminophen (TYLENOL) tablet 650 mg  650 mg Oral Q6H PRN Ransom Nickson, Madie Reno, MD   650 mg at 03/26/19 2015  . alum & mag hydroxide-simeth (MAALOX/MYLANTA) 200-200-20 MG/5ML suspension 30 mL  30 mL Oral Q4H PRN Gwen Sarvis T, MD      . amoxicillin (AMOXIL) capsule 500 mg  500 mg Oral Q12H Elona Yinger T, MD   500 mg at 03/28/19 1139  . aspirin EC tablet 81 mg  81 mg Oral Daily Shea Swalley, Madie Reno, MD   81 mg at 03/28/19 1139  . atorvastatin (LIPITOR) tablet 80 mg  80 mg Oral q1800 Vedha Tercero T, MD   80 mg at 03/27/19 1700  . busPIRone (BUSPAR) tablet 15 mg  15 mg Oral TID Sharma Covert, MD   15 mg at 03/28/19 1137  . docusate sodium (COLACE) capsule 100 mg  100 mg Oral BID Chavela Justiniano, Madie Reno, MD   100 mg at 03/28/19 1137  . DULoxetine (CYMBALTA) DR capsule 20 mg  20 mg Oral Daily Dalan Cowger T, MD   20 mg at 03/28/19 1139  . escitalopram (LEXAPRO) tablet 10 mg  10 mg Oral Daily Jameis Newsham, Madie Reno, MD   10 mg at 03/28/19 1137  . feeding supplement (ENSURE ENLIVE) (ENSURE ENLIVE) liquid 237 mL  237 mL Oral BID BM Xzavien Harada T, MD   237 mL at 03/27/19 2100  . glycopyrrolate (ROBINUL) injection 0.1 mg  0.1 mg Intravenous Once Byren Pankow T, MD      . glycopyrrolate (ROBINUL) injection 0.2 mg  0.2 mg Intravenous Once Shaquaya Wuellner T, MD      . hydrocortisone cream 1 %   Topical BID PRN Dekisha Mesmer, Madie Reno, MD   1 application at 91/91/66 1601  . hydrOXYzine (ATARAX/VISTARIL) tablet 50 mg  50 mg Oral Q6H PRN Lamyra Malcolm, Madie Reno, MD   50 mg at 03/26/19 1852  . ibuprofen (ADVIL)  tablet 600 mg  600 mg Oral Q6H PRN Nikeisha Klutz, Madie Reno, MD   600 mg at 03/27/19 1657  . insulin aspart (novoLOG)  injection 0-15 Units  0-15 Units Subcutaneous TID WC Franciso Dierks, Madie Reno, MD   2 Units at 03/28/19 1131  . irbesartan (AVAPRO) tablet 150 mg  150 mg Oral Daily Claris Pech T, MD   150 mg at 03/28/19 1141  . LORazepam (ATIVAN) tablet 1 mg  1 mg Oral Q8H PRN Janita Camberos, Madie Reno, MD   1 mg at 03/23/19 2130  . LORazepam (ATIVAN) tablet 1 mg  1 mg Oral BID Makaylin Carlo, Madie Reno, MD   1 mg at 03/28/19 1138  . magnesium hydroxide (MILK OF MAGNESIA) suspension 30 mL  30 mL Oral Daily PRN Damarien Nyman, Madie Reno, MD   30 mL at 03/24/19 1337  . menthol-cetylpyridinium (CEPACOL) lozenge 3 mg  1 lozenge Oral PRN Aurelio Mccamy T, MD      . metFORMIN (GLUCOPHAGE-XR) 24 hr tablet 1,000 mg  1,000 mg Oral BID WC Aiesha Leland T, MD   1,000 mg at 03/28/19 1137  . midazolam (VERSED) injection 2 mg  2 mg Intravenous Once Tanav Orsak T, MD      . multivitamin with minerals tablet 1 tablet  1 tablet Oral Daily Alwin Lanigan, Madie Reno, MD   1 tablet at 03/28/19 1140  . ondansetron (ZOFRAN) injection 4 mg  4 mg Intravenous Once Jeaneen Cala T, MD      . ondansetron [facility]) injection 4 mg  4 mg Intravenous Once Roman Dubuc T, MD      . ondansetron [facility]) injection 4 mg  4 mg Intravenous Once PRN Gunnar Bulla, MD      . ondansetron [facility]) injection 4 mg  4 mg Intravenous Once PRN Gunnar Fusi, MD      . pantoprazole (PROTONIX) EC tablet 40 mg  40 mg Oral Honor Junes, Madie Reno, MD   40 mg at 03/27/19 7124  . polyethylene glycol (MIRALAX / GLYCOLAX) packet 17 g  17 g Oral Daily Sharma Covert, MD   17 g at 03/28/19 1140  . senna-docusate (Senokot-S) tablet 1 tablet  1 tablet Oral QHS Sharma Covert, MD   1 tablet at 03/27/19 2159  . sodium phosphate (FLEET) 7-19 GM/118ML enema 1 enema  1 enema Rectal Daily PRN Sharma Covert, MD      . thiamine (VITAMIN B-1) tablet 100 mg  100 mg Oral Daily Vaniya Augspurger T, MD   100  mg at 03/28/19 1139    Lab Results:  Results for orders placed or performed during the hospital encounter of 03/15/19 (from the past 48 hour(s))  Glucose, capillary     Status: Abnormal   Collection Time: 03/26/19  4:22 PM  Result Value Ref Range   Glucose-Capillary 202 (H) 70 - 99 mg/dL  Glucose, capillary     Status: Abnormal   Collection Time: 03/26/19  8:18 PM  Result Value Ref Range   Glucose-Capillary 165 (H) 70 - 99 mg/dL   Comment 1 Notify RN   Glucose, capillary     Status: Abnormal   Collection Time: 03/27/19  6:59 AM  Result Value Ref Range   Glucose-Capillary 196 (H) 70 - 99 mg/dL  Glucose, capillary     Status: Abnormal   Collection Time: 03/27/19 11:07 AM  Result Value Ref Range   Glucose-Capillary 163 (H) 70 - 99 mg/dL   Comment 1 Notify RN   Glucose, capillary     Status: Abnormal   Collection Time: 03/27/19  4:35 PM  Result Value Ref Range   Glucose-Capillary 130 (H) 70 - 99  mg/dL  Glucose, capillary     Status: Abnormal   Collection Time: 03/28/19  7:00 AM  Result Value Ref Range   Glucose-Capillary 169 (H) 70 - 99 mg/dL   Comment 1 Notify RN   Glucose, capillary     Status: Abnormal   Collection Time: 03/28/19 10:34 AM  Result Value Ref Range   Glucose-Capillary 146 (H) 70 - 99 mg/dL    Blood Alcohol level:  Lab Results  Component Value Date   ETH <10 16/06/9603    Metabolic Disorder Labs: Lab Results  Component Value Date   HGBA1C 6.3 (H) 03/09/2019   MPG 134.11 03/09/2019   MPG 180 (H) 04/03/2014   No results found for: PROLACTIN Lab Results  Component Value Date   CHOL 174 10/09/2018   TRIG 210 (H) 10/09/2018   HDL 44 10/09/2018   CHOLHDL 4.0 10/09/2018   LDLCALC 88 10/09/2018   LDLCALC 77 06/28/2018    Physical Findings: AIMS:  , ,  ,  ,    CIWA:    COWS:     Musculoskeletal: Strength & Muscle Tone: within normal limits Gait & Station: normal Patient leans: N/A  Psychiatric Specialty Exam: Physical Exam  Nursing note and  vitals reviewed. Constitutional: She appears well-developed and well-nourished.  HENT:  Head: Normocephalic and atraumatic.  Eyes: Pupils are equal, round, and reactive to light. Conjunctivae are normal.  Neck: Normal range of motion.  Cardiovascular: Regular rhythm and normal heart sounds.  Respiratory: Effort normal.  GI: Soft.  Musculoskeletal: Normal range of motion.  Neurological: She is alert.  Skin: Skin is warm and dry.  Psychiatric: Her speech is normal and behavior is normal. Judgment and thought content normal. Her mood appears anxious. She exhibits abnormal recent memory.    Review of Systems  Constitutional: Negative.   HENT: Negative.   Eyes: Negative.   Respiratory: Negative.   Cardiovascular: Negative.   Gastrointestinal: Negative.   Musculoskeletal: Negative.   Skin: Negative.   Neurological: Negative.   Psychiatric/Behavioral: Positive for memory loss. Negative for depression, hallucinations, substance abuse and suicidal ideas. The patient is nervous/anxious.     Blood pressure (!) 147/76, pulse 80, temperature 98.5 F (36.9 C), resp. rate 17, height 5' 4"  (1.626 m), weight 76.2 kg, SpO2 95 %.Body mass index is 28.84 kg/m.  General Appearance: Casual  Eye Contact:  Fair  Speech:  Clear and Coherent  Volume:  Decreased  Mood:  Euthymic  Affect:  Constricted  Thought Process:  Goal Directed  Orientation:  Full (Time, Place, and Person)  Thought Content:  Logical  Suicidal Thoughts:  No  Homicidal Thoughts:  No  Memory:  Immediate;   Fair Recent;   Fair Remote;   Fair  Judgement:  Fair  Insight:  Fair  Psychomotor Activity:  Normal  Concentration:  Concentration: Fair  Recall:  [employer] of Knowledge:  Fair  Language:  Fair  Akathisia:  No  Handed:  Right  AIMS (if indicated):     Assets:  Desire for Improvement Housing Physical Health Social Support  ADL's:  Intact  Cognition:  Impaired,  Mild  Sleep:  Number of Hours: 7.5     Treatment  Plan Summary: Daily contact with patient to assess and evaluate symptoms and progress in treatment, Medication management and Plan Patient appears to probably have plateaued with ECT benefit and is now complaining of more memory problems.  We would not be scheduling ECT for Friday anyway so I think we are  looking at likely discharge in the next 1 to 2 days.  I am increasing the Lexapro to 10 mg and trying to cut down a little bit more on the Cymbalta.  No other immediate change to medicine.  Patient will be kept informed of the outpatient treatment plan.  No need to change anything for her diabetes or high blood pressure.  Alethia Berthold, MD 03/28/2019, 3:18 PM

## 2019-03-29 DIAGNOSIS — F329 Major depressive disorder, single episode, unspecified: Secondary | ICD-10-CM | POA: Diagnosis not present

## 2019-03-29 LAB — GLUCOSE, CAPILLARY
Glucose-Capillary: 108 mg/dL — ABNORMAL HIGH (ref 70–99)
Glucose-Capillary: 158 mg/dL — ABNORMAL HIGH (ref 70–99)
Glucose-Capillary: 381 mg/dL — ABNORMAL HIGH (ref 70–99)

## 2019-03-29 MED ORDER — ATORVASTATIN CALCIUM 80 MG PO TABS: 80.0000 mg | ORAL_TABLET | Freq: Every day | ORAL | 0 refills | Status: DC

## 2019-03-29 MED ORDER — ASPIRIN 81 MG PO TBEC: 81.0000 mg | DELAYED_RELEASE_TABLET | Freq: Every day | ORAL | 0 refills | Status: DC

## 2019-03-29 MED ORDER — METFORMIN HCL ER (OSM) 1000 MG PO TB24: 1000.0000 mg | ORAL_TABLET | Freq: Two times a day (BID) | ORAL | 0 refills | Status: DC

## 2019-03-29 MED ORDER — HYDROXYZINE HCL 50 MG PO TABS: 50.0000 mg | ORAL_TABLET | Freq: Four times a day (QID) | ORAL | 0 refills | Status: DC | PRN

## 2019-03-29 MED ORDER — DOCUSATE SODIUM 100 MG PO CAPS: 100.0000 mg | ORAL_CAPSULE | Freq: Two times a day (BID) | ORAL | 0 refills | Status: DC

## 2019-03-29 MED ORDER — BUSPIRONE HCL 15 MG PO TABS: 15.0000 mg | ORAL_TABLET | Freq: Three times a day (TID) | ORAL | 0 refills | Status: DC

## 2019-03-29 MED ORDER — LORAZEPAM 1 MG PO TABS: 1.0000 mg | ORAL_TABLET | Freq: Two times a day (BID) | ORAL | 0 refills | Status: DC

## 2019-03-29 MED ORDER — AMOXICILLIN 500 MG PO CAPS: 500.0000 mg | ORAL_CAPSULE | Freq: Two times a day (BID) | ORAL | 0 refills | Status: DC

## 2019-03-29 MED ORDER — IRBESARTAN 150 MG PO TABS: 150.0000 mg | ORAL_TABLET | Freq: Every day | ORAL | 0 refills | Status: DC

## 2019-03-29 MED ORDER — PANTOPRAZOLE SODIUM 40 MG PO TBEC: 40.0000 mg | DELAYED_RELEASE_TABLET | ORAL | 0 refills | Status: DC

## 2019-03-29 MED ORDER — DULOXETINE HCL 20 MG PO CPEP: 20.0000 mg | ORAL_CAPSULE | Freq: Every day | ORAL | 0 refills | Status: DC

## 2019-03-29 MED ORDER — ESCITALOPRAM OXALATE 10 MG PO TABS: 10.0000 mg | ORAL_TABLET | Freq: Every day | ORAL | 0 refills | Status: DC

## 2019-03-29 NOTE — Progress Notes (Signed)
Patient alert and oriented x 4, affect isflat but she brightens upon approach, thoughts are organized and coherent, she appearslessanxious  S/P ECT no distress noted Patientdenies SI/HI/AVHand rateddepression a5/10 ( 0 low - 10 high )patient is complaint with medication. Emotional support and encouragement given to patients heattendedevening wrap up group. Patient attends group and was appropriate, 15 minutes safety checksmaintained will continue to monitor

## 2019-03-29 NOTE — Progress Notes (Signed)
[facility] MD Progress Note  03/29/2019 4:35 PM Sahithi Ordoyne  MRN:  [id number] Subjective: Follow-up for this patient with depression and anxiety.  Patient has had ECT treatments and shown significant improvement but appears to have plateaued recently.  Patient's mood and anxiety are clearly improved on current medicine.  She is now denying any suicidal ideation.  Physically she is doing well.  We talked about the challenges she will still have when she goes home.  Those include the difficulty in taking care of her house and of course her medical conditions.  Patient is still talking about getting off of the Ativan but I told her that it would not be a good idea to stop it at this point.  She needs to be following up with her outpatient psychiatrist.  Patient appears to have reached maximum benefit from hospitalization.  Plan is for discharge tomorrow with follow-up with her outpatient psychiatrist in the community. Principal Problem: Severe recurrent major depression without psychotic features ([place]) Diagnosis: Principal Problem:   Severe recurrent major depression without psychotic features ([place]) Active Problems:   Type 2 diabetes mellitus with diabetic polyneuropathy, with long-term current use of insulin (HCC)   GERD (gastroesophageal reflux disease)   Hypertension   Obesity (BMI 30-39.9)   Constipation   History of hypothyroidism   Major depression  Total Time spent with patient: 30 minutes  Past Psychiatric History: Past history of recurrent depression  Past Medical History:  Past Medical History:  Diagnosis Date  . Acute acalculous cholecystitis s/p lap chole 04/03/2014 04/02/2014  . Allergy   . Anemia   . Anxiety   . Asthma   . Depression   . Fatty liver    noted on CT per [facility] records  . Fibromyalgia   . GERD (gastroesophageal reflux disease)   . Hyperlipidemia associated with type 2 diabetes mellitus ([place])   . Hypertension   . Hypothyroid 2015   Transiently in 2015. Synthroid 59mg  was stopped 05/07/2016 w/ tsh nml following.  . Obesity (BMI 30-39.9)   . OSA (obstructive sleep apnea)    non-compliant with CPAP  . Seborrheic dermatitis    [facility]  . Stroke ([facility]    TIA  . [place] 2 [place] mellitus [place]     Past Surgical History:  Procedure Laterality Date  . BREAST REDUCTION SURGERY  06/2008  . CHOLECYSTECTOMY N/A 04/03/2014   Procedure: LAPAROSCOPIC CHOLECYSTECTOMY ;  Surgeon: SAdin Hector MD;  Location: [facility];  Service: General;  Laterality: N/A;  . REDUCTION MAMMAPLASTY    . TUBAL LIGATION     Family History:  Family History  Problem Relation Age of Onset  . Atrial fibrillation Mother   . Heart disease Father        CAD  . Diabetes Brother   . Cancer Maternal Grandmother   . Diabetes Paternal Grandmother   . Fibromyalgia Sister   . Colitis Sister    Family Psychiatric  History: See previous Social History:  Social History   Substance and Sexual Activity  Alcohol Use No     Social History   Substance and Sexual Activity  Drug Use No    Social History   Socioeconomic History  . Marital status: Divorced    Spouse name: Not on file  . Number of children: Not on file  . Years of education: Not on file  . Highest education level: Not on file  Occupational History  . Not on file  Social Needs  . Financial resource strain: Not on file  . Food insecurity    Worry: Not on file    Inability: Not on file  . Transportation needs    Medical: Not on file    Non-medical: Not on file  Tobacco Use  . Smoking status: Former Smoker    Packs/day: 4.00    Years: 15.00    Pack years: 60.00    Types: Cigarettes    Quit date: 04/22/2004    Years since quitting: 14.9  . Smokeless tobacco: Never Used  . Tobacco comment: 4 pack year hx  Substance and Sexual Activity  . Alcohol use: No  . Drug use: No  . Sexual activity: Never  Lifestyle  . Physical activity    Days per week: Not on file    Minutes per  session: Not on file  . Stress: Not on file  Relationships  . Social Herbalist on phone: Not on file    Gets together: Not on file    Attends religious service: Not on file    Active member of club or organization: Not on file    Attends meetings of clubs or organizations: Not on file    Relationship status: Not on file  Other Topics Concern  . Not on file  Social History Narrative  . Not on file   Additional Social History:                         Sleep: Fair  Appetite:  Fair  Current Medications: Current Facility-Administered Medications  Medication Dose Route Frequency Provider Last Rate Last Dose  . acetaminophen (TYLENOL) tablet 650 mg  650 mg Oral Q6H PRN Marnesha Gagen, Madie Reno, MD   650 mg at 03/26/19 2015  . alum & mag hydroxide-simeth (MAALOX/MYLANTA) 200-200-20 MG/5ML suspension 30 mL  30 mL Oral Q4H PRN Marcine Gadway T, MD      . amoxicillin (AMOXIL) capsule 500 mg  500 mg Oral Q12H Fed Ceci, Madie Reno, MD   500 mg at 03/29/19 0744  . aspirin EC tablet 81 mg  81 mg Oral Daily Sharlena Kristensen, Madie Reno, MD   81 mg at 03/29/19 0745  . atorvastatin (LIPITOR) tablet 80 mg  80 mg Oral q1800 Biviana Saddler, Madie Reno, MD   80 mg at 03/28/19 1652  . busPIRone (BUSPAR) tablet 15 mg  15 mg Oral TID Sharma Covert, MD   15 mg at 03/29/19 1158  . docusate sodium (COLACE) capsule 100 mg  100 mg Oral BID Jadore Veals, Madie Reno, MD   100 mg at 03/29/19 0745  . DULoxetine (CYMBALTA) DR capsule 20 mg  20 mg Oral Daily Korin Setzler, Madie Reno, MD   20 mg at 03/29/19 0746  . escitalopram (LEXAPRO) tablet 10 mg  10 mg Oral Daily Samiha Denapoli, Madie Reno, MD   10 mg at 03/29/19 0744  . feeding supplement (ENSURE ENLIVE) (ENSURE ENLIVE) liquid 237 mL  237 mL Oral BID BM Chasady Longwell T, MD   237 mL at 03/27/19 2100  . hydrocortisone cream 1 %   Topical BID PRN Ilda Laskin, Madie Reno, MD   1 application at 31/54/00 1601  . hydrOXYzine (ATARAX/VISTARIL) tablet 50 mg  50 mg Oral Q6H PRN Tilly Pernice, Madie Reno, MD   50 mg at 03/26/19  1852  . ibuprofen (ADVIL) tablet 600 mg  600 mg Oral Q6H PRN Ozie Dimaria, Madie Reno, MD   600 mg at 03/27/19 1657  .  insulin aspart (novoLOG) injection 0-15 Units  0-15 Units Subcutaneous TID WC Willmer Fellers, Madie Reno, MD   15 Units at 03/29/19 1157  . irbesartan (AVAPRO) tablet 150 mg  150 mg Oral Daily Fumie Fiallo, Madie Reno, MD   150 mg at 03/29/19 0746  . LORazepam (ATIVAN) tablet 1 mg  1 mg Oral BID Kevonte Vanecek, Madie Reno, MD   1 mg at 03/29/19 0745  . magnesium hydroxide (MILK OF MAGNESIA) suspension 30 mL  30 mL Oral Daily PRN Doylene Splinter, Madie Reno, MD   30 mL at 03/24/19 1337  . menthol-cetylpyridinium (CEPACOL) lozenge 3 mg  1 lozenge Oral PRN Talaysha Freeberg T, MD      . metFORMIN (GLUCOPHAGE-XR) 24 hr tablet 1,000 mg  1,000 mg Oral BID WC Marieke Lubke, Madie Reno, MD   1,000 mg at 03/29/19 0746  . multivitamin with minerals tablet 1 tablet  1 tablet Oral Daily Zanae Kuehnle, Madie Reno, MD   1 tablet at 03/29/19 0745  . pantoprazole (PROTONIX) EC tablet 40 mg  40 mg Oral BH-q7a Jeraline Marcinek, Madie Reno, MD   40 mg at 03/29/19 [phone]  . senna-docusate (Senokot-S) tablet 1 tablet  1 tablet Oral QHS Sharma Covert, MD   1 tablet at 03/28/19 2132  . thiamine (VITAMIN B-1) tablet 100 mg  100 mg Oral Daily Jerri Glauser, Madie Reno, MD   100 mg at 03/29/19 0745    Lab Results:  Results for orders placed or performed during the hospital encounter of 03/15/19 (from the past 48 hour(s))  Glucose, capillary     Status: Abnormal   Collection Time: 03/28/19  7:00 AM  Result Value Ref Range   Glucose-Capillary 169 (H) 70 - 99 mg/dL   Comment 1 Notify RN   Glucose, capillary     Status: Abnormal   Collection Time: 03/28/19 10:34 AM  Result Value Ref Range   Glucose-Capillary 146 (H) 70 - 99 mg/dL  Glucose, capillary     Status: Abnormal   Collection Time: 03/28/19  4:24 PM  Result Value Ref Range   Glucose-Capillary 134 (H) 70 - 99 mg/dL   Comment 1 Document in Chart   Glucose, capillary     Status: Abnormal   Collection Time: 03/28/19  8:16 PM  Result Value  Ref Range   Glucose-Capillary 108 (H) 70 - 99 mg/dL   Comment 1 Notify RN   Glucose, capillary     Status: Abnormal   Collection Time: 03/29/19  7:05 AM  Result Value Ref Range   Glucose-Capillary 158 (H) 70 - 99 mg/dL  Glucose, capillary     Status: Abnormal   Collection Time: 03/29/19 11:29 AM  Result Value Ref Range   Glucose-Capillary 381 (H) 70 - 99 mg/dL   Comment 1 Notify RN     Blood Alcohol level:  Lab Results  Component Value Date   ETH <10 34/19/6222    Metabolic Disorder Labs: Lab Results  Component Value Date   HGBA1C 6.3 (H) 03/09/2019   MPG 134.11 03/09/2019   MPG 180 (H) 04/03/2014   No results found for: PROLACTIN Lab Results  Component Value Date   CHOL 174 10/09/2018   TRIG 210 (H) 10/09/2018   HDL 44 10/09/2018   CHOLHDL 4.0 10/09/2018   LDLCALC 88 10/09/2018   LDLCALC 77 06/28/2018    Physical Findings: AIMS:  , ,  ,  ,    CIWA:    COWS:     Musculoskeletal: Strength & Muscle Tone: within normal limits Gait &  Station: normal Patient leans: N/A  Psychiatric Specialty Exam: Physical Exam  Nursing note and vitals reviewed. Constitutional: She appears well-developed and well-nourished.  HENT:  Head: Normocephalic and atraumatic.  Eyes: Pupils are equal, round, and reactive to light. Conjunctivae are normal.  Neck: Normal range of motion.  Cardiovascular: Regular rhythm and normal heart sounds.  Respiratory: Effort normal.  GI: Soft.  Musculoskeletal: Normal range of motion.  Neurological: She is alert.  Skin: Skin is warm and dry.  Psychiatric: Judgment normal. Her mood appears anxious. Her affect is blunt. Her speech is delayed. She is slowed. Thought content is not paranoid. She expresses no homicidal and no suicidal ideation. She exhibits abnormal recent memory.    Review of Systems  Constitutional: Negative.   HENT: Negative.   Eyes: Negative.   Respiratory: Negative.   Cardiovascular: Negative.   Gastrointestinal: Negative.    Musculoskeletal: Negative.   Skin: Negative.   Neurological: Negative.   Psychiatric/Behavioral: Positive for memory loss. Negative for depression, hallucinations, substance abuse and suicidal ideas. The patient is nervous/anxious. The patient does not have insomnia.     Blood pressure 137/75, pulse (!) 104, temperature 98.4 F (36.9 C), temperature source Oral, resp. rate 18, height 5' 4"  (1.626 m), weight 76.2 kg, SpO2 96 %.Body mass index is 28.84 kg/m.  General Appearance: Casual  Eye Contact:  Good  Speech:  Slow  Volume:  Decreased  Mood:  Anxious  Affect:  Constricted  Thought Process:  Coherent  Orientation:  Full (Time, Place, and Person)  Thought Content:  Logical  Suicidal Thoughts:  No  Homicidal Thoughts:  No  Memory:  Immediate;   Fair Recent;   Fair Remote;   Fair  Judgement:  Fair  Insight:  Fair  Psychomotor Activity:  Decreased  Concentration:  Concentration: Fair  Recall:  [employer] of Knowledge:  Fair  Language:  Fair  Akathisia:  No  Handed:  Right  AIMS (if indicated):     Assets:  Desire for Improvement Housing Resilience Social Support  ADL's:  Intact  Cognition:  Impaired,  Mild  Sleep:  Number of Hours: 6     Treatment Plan Summary: Daily contact with patient to assess and evaluate symptoms and progress in treatment, Medication management and Plan Sat down and reviewed patient's medicine with her.  Based on her blood sugars I will not be discharging her with insulin but she knows she needs to follow-up with her primary care doctor and stick to a carbohydrate limited diet.  Patient informed that she should continue this lower dose of Ativan for now but I will pass it on to her outpatient psychiatrist that the patient's plan is to continue tapering.  We are planning on discharge tomorrow.  We have not arranged for maintenance ECT treatments at this point as transportation appears to be an issue and the patient has been hesitant to agree to  continue treatments however we would be open to that if it looks like it is possible and is necessary when she returns home.  Alethia Berthold, MD 03/29/2019, 4:35 PM

## 2019-03-29 NOTE — BHH Suicide Risk Assessment (Signed)
[facility] Discharge Suicide Risk Assessment   Principal Problem: Severe recurrent major depression without psychotic features [facility]) Discharge Diagnoses: Principal Problem:   Severe recurrent major depression without psychotic features ([place]) Active Problems:   Type 2 diabetes mellitus with diabetic polyneuropathy, with long-term current use of insulin (HCC)   GERD (gastroesophageal reflux disease)   Hypertension   Obesity (BMI 30-39.9)   Constipation   History of hypothyroidism   Major depression   Total Time spent with patient: 45 minutes  Musculoskeletal: Strength & Muscle Tone: within normal limits Gait & Station: normal Patient leans: N/A  Psychiatric Specialty Exam: Review of Systems  Constitutional: Negative.   HENT: Negative.   Eyes: Negative.   Respiratory: Negative.   Cardiovascular: Negative.   Gastrointestinal: Negative.   Musculoskeletal: Negative.   Skin: Negative.   Neurological: Negative.   Psychiatric/Behavioral: Positive for memory loss. Negative for depression, hallucinations, substance abuse and suicidal ideas. The patient is nervous/anxious.     Blood pressure 137/75, pulse (!) 104, temperature 98.4 F (36.9 C), temperature source Oral, resp. rate 18, height 5' 4"  (1.626 m), weight 76.2 kg, SpO2 96 %.Body mass index is 28.84 kg/m.  General Appearance: Casual  Eye Contact::  Good  Speech:  Clear and [id number]  Volume:  Decreased  Mood:  Anxious  Affect:  Constricted  Thought Process:  Goal Directed  Orientation:  Full (Time, Place, and Person)  Thought Content:  Logical  Suicidal Thoughts:  No  Homicidal Thoughts:  No  Memory:  Immediate;   Fair Recent;   Fair Remote;   Fair  Judgement:  Fair  Insight:  Fair  Psychomotor Activity:  Decreased  Concentration:  Fair  Recall:  [employer] of Knowledge:Fair  Language: Fair  Akathisia:  No  Handed:  Right  AIMS (if indicated):     Assets:  Desire for Improvement Housing Resilience Social  Support  Sleep:  Number of Hours: 6  Cognition: Impaired,  Mild  ADL's:  Intact   Mental Status Per Nursing Assessment::   On Admission:  Self-harm thoughts  Demographic Factors:  Divorced or widowed, Caucasian and Unemployed  Loss Factors: Loss of significant relationship  Historical Factors: Anniversary of important loss and Impulsivity  Risk Reduction Factors:   Positive social support and Positive therapeutic relationship  Continued Clinical Symptoms:  Depression:   Anhedonia  Cognitive Features That Contribute To Risk:  Thought constriction (tunnel vision)    Suicide Risk:  Minimal: No identifiable suicidal ideation.  Patients presenting with no risk factors but with morbid ruminations; may be classified as minimal risk based on the severity of the depressive symptoms  Follow-up Information    [facility] Follow [facility].   Why: If you want to follow up with the provider in [facility] who accepts your insurance, please call and schedule an appointment with Dr. Lendon Colonel. Thank you! Contact information: [address] [place], [place] [address] Phone: [place] Follow up.   Why: If you want to continue seeing Dr. Toy Care, please call and schedule a follow up appointment within three days of discharge. Thank you! Contact information: [address] [place], [place] [address] Phone: [phone] Fax: [phone]          Plan Of Care/Follow-up recommendations:  Activity:  Activity as tolerated Diet:  Stick to a carbohydrate controlled diabetic diet Other:  Follow-up with outpatient psychiatrist.  Alethia Berthold, MD 03/29/2019, 4:41 PM

## 2019-03-29 NOTE — BHH Group Notes (Signed)
Balance In Life 03/29/2019 1PM  Type of Therapy/Topic:  Group Therapy:  Balance in Life  Participation Level:  Did Not Attend  Description of Group:   This group will address the concept of balance and how it feels and looks when one is unbalanced. Patients will be encouraged to process areas in their lives that are out of balance and identify reasons for remaining unbalanced. Facilitators will guide patients in utilizing problem-solving interventions to address and correct the stressor making their life unbalanced. Understanding and applying boundaries will be explored and addressed for obtaining and maintaining a balanced life. Patients will be encouraged to explore ways to assertively make their unbalanced needs known to significant others in their lives, using other group members and facilitator for support and feedback.  Therapeutic Goals: 1. Patient will identify two or more emotions or situations they have that consume much of in their lives. 2. Patient will identify signs/triggers that life has become out of balance:  3. Patient will identify two ways to set boundaries in order to achieve balance in their lives:  4. Patient will demonstrate ability to communicate their needs through discussion and/or role plays  Summary of Patient Progress:    Therapeutic Modalities:   Cognitive Behavioral Therapy Solution-Focused Therapy Assertiveness Training  Jeanette Rose Lynelle Smoke, LCSW

## 2019-03-29 NOTE — Progress Notes (Signed)
Recreation Therapy Notes  Date: 03/29/2019  Time: 9:30 am   Location: Craft room   Behavioral response: N/A   Intervention Topic: Problem Solving  Discussion/Intervention: Patient did not attend group.   Clinical Observations/Feedback:  Patient did not attend group.   Jeanette Rose LRT/CTRS        [name] 03/29/2019 10:58 AM

## 2019-03-29 NOTE — Progress Notes (Signed)
D: Patient stated slept fair last night .Stated appetite  good and energy level low Stated concentration poor . Stated on Depression scale 0 , hopeless 0 and anxiety 5.( low 0-10 high) Denies suicidal  homicidal ideations  .  No auditory hallucinations  No pain concerns . Appropriate ADL'S. Interacting with peers and staff.  Working on Publishing copy on Teacher, adult education .Patient attending unit programing , able to verbalize feelings . Emotional and mental status  improved  Patient continue to go to ECT therapy , improvement / positive change noted . Continue to work on  coping , decision making and anxiety  concerns. Denies suicidal ideations   . Patient verbalize understanding  with information received . Patient aware of discharge tomorrow   A: Encourage patient participation with unit programming . Instruction  Given on  Medication , verbalize understanding.  R: Voice no other concerns. Staff continue to monitor

## 2019-03-29 NOTE — Plan of Care (Signed)
Working on Publishing copy on Teacher, adult education .Patient attending unit programing , able to verbalize feelings . Emotional and mental status  improved  Patient continue to go to ECT therapy , improvement / positive change noted . Continue to work on  coping , decision making and anxiety  concerns. Denies suicidal ideations   . Patient verbalize understanding  with information received .   Problem: Education: Goal: Utilization of techniques to improve thought processes will improve Outcome: Progressing Goal: Knowledge of the prescribed therapeutic regimen will improve Outcome: Progressing   Problem: Activity: Goal: Interest or engagement in leisure activities will improve Outcome: Progressing Goal: Imbalance in normal sleep/wake cycle will improve Outcome: Progressing   Problem: Role Relationship: Goal: Will demonstrate positive changes in social behaviors and relationships Outcome: Progressing   Problem: Health Behavior/Discharge Planning: Goal: Ability to make decisions will improve Outcome: Progressing Goal: Compliance with therapeutic regimen will improve Outcome: Progressing   Problem: Safety: Goal: Ability to disclose and discuss suicidal ideas will improve Outcome: Progressing Goal: Ability to identify and utilize support systems that promote safety will improve Outcome: Progressing   Problem: Self-Concept: Goal: Will verbalize positive feelings about self Outcome: Progressing Goal: Level of anxiety will decrease Outcome: Progressing   Problem: Education: Goal: Knowledge of [place] [facility] Education information/materials will improve Outcome: Progressing Goal: Emotional status will improve Outcome: Progressing Goal: Mental status will improve Outcome: Progressing Goal: Verbalization of understanding the information provided will improve Outcome: Progressing   Problem: Self-Concept: Goal: Level of anxiety will decrease Outcome: Progressing

## 2019-03-30 MED ORDER — ESCITALOPRAM OXALATE 10 MG PO TABS: 10.0000 mg | ORAL_TABLET | Freq: Every day | ORAL | 1 refills | Status: DC

## 2019-03-30 MED ORDER — IRBESARTAN 150 MG PO TABS: 150.0000 mg | ORAL_TABLET | Freq: Every day | ORAL | 1 refills | Status: DC

## 2019-03-30 MED ORDER — DULOXETINE HCL 20 MG PO CPEP: 20.0000 mg | ORAL_CAPSULE | Freq: Every day | ORAL | 1 refills | Status: DC

## 2019-03-30 MED ORDER — ATORVASTATIN CALCIUM 80 MG PO TABS: 80.0000 mg | ORAL_TABLET | Freq: Every day | ORAL | 1 refills | Status: DC

## 2019-03-30 MED ORDER — BUSPIRONE HCL 15 MG PO TABS: 15.0000 mg | ORAL_TABLET | Freq: Three times a day (TID) | ORAL | 1 refills | Status: DC

## 2019-03-30 MED ORDER — METFORMIN HCL ER (OSM) 1000 MG PO TB24: 1000.0000 mg | ORAL_TABLET | Freq: Two times a day (BID) | ORAL | 1 refills | Status: DC

## 2019-03-30 MED ORDER — DOCUSATE SODIUM 100 MG PO CAPS: 100.0000 mg | ORAL_CAPSULE | Freq: Two times a day (BID) | ORAL | 1 refills | Status: DC

## 2019-03-30 MED ORDER — ASPIRIN 81 MG PO TBEC: 81.0000 mg | DELAYED_RELEASE_TABLET | Freq: Every day | ORAL | 1 refills | Status: DC

## 2019-03-30 MED ORDER — HYDROXYZINE HCL 50 MG PO TABS: 50.0000 mg | ORAL_TABLET | Freq: Four times a day (QID) | ORAL | 1 refills | Status: DC | PRN

## 2019-03-30 MED ORDER — PANTOPRAZOLE SODIUM 40 MG PO TBEC: 40.0000 mg | DELAYED_RELEASE_TABLET | ORAL | 1 refills | Status: DC

## 2019-03-30 MED ORDER — LORAZEPAM 1 MG PO TABS: 1.0000 mg | ORAL_TABLET | Freq: Two times a day (BID) | ORAL | 0 refills | Status: DC

## 2019-03-30 NOTE — Progress Notes (Signed)
D - Patient was in the day room upon arrival to the unit. Patient was pleasant during assessment. Patient asked how she felt about her discharge tomorrow and she stated, "I hope I will be ok, my dog died while I was in here." Patient given support and encouragement to be active with her treatment plan when she gets home.   A - Patient compliant with medication administration per MD orders and procedures on the unit. Patient given education. Patient given support and encouragement to be active in her treatment plan. Patient informed to let staff know if there are any issues or problems on the unit.   R - Patient being monitored Q 15 minutes for safety per unit protocol. Patient remains safe on the unit.

## 2019-03-30 NOTE — Plan of Care (Signed)
Patient compliant with medication administration per MD orders.   Problem: Education: Goal: Knowledge of the prescribed therapeutic regimen will improve Outcome: Progressing   Problem: Health Behavior/Discharge Planning: Goal: Compliance with therapeutic regimen will improve Outcome: Progressing

## 2019-03-30 NOTE — Progress Notes (Signed)
Recreation Therapy Notes  INPATIENT RECREATION TR PLAN  Patient Details Name: Jeanette Rose MRN: [id number] DOB: 10/03/1955 Today's Date: 03/30/2019  Rec Therapy Plan Is patient appropriate for Therapeutic Recreation?: Yes Treatment times per week: at least 3 Estimated Length of Stay: 5-7 days TR Treatment/Interventions: Group participation (Comment)  Discharge Criteria Pt will be discharged from therapy if:: Discharged Treatment plan/goals/alternatives discussed and agreed upon by:: Patient/family  Discharge Summary Short term goals set: Patient will engage in groups without prompting or encouragement from LRT x3 group sessions within 5 recreation therapy group sessions Short term goals met: Not met Reason goals not met: Patient did not attend any groups Therapeutic equipment acquired: N/A Reason patient discharged from therapy: Discharge from hospital Pt/family agrees with progress & goals achieved: Yes Date patient discharged from therapy: 03/30/19   Becket Wecker 03/30/2019, 11:18 AM

## 2019-03-30 NOTE — Progress Notes (Signed)
  [facility] Adult Case Management Discharge Plan :  Will you be returning to the same living situation after discharge:  Yes,  pt is returning home.  At discharge, do you have transportation home?: Yes,  pt's sister will provide transportation.  Do you have the ability to pay for your medications: Yes,  BCBS  Release of information consent forms completed and in the chart;  Patient's signature needed at discharge.  Patient to Follow up at: Follow-up Information    [facility] Follow up.   Why: If you want to follow up with the provider in [facility] who accepts your insurance, please call and schedule an appointment with Dr. Lendon Colonel. Thank you! Contact information: [address] [place], [place] [address] Phone: [place] Follow up.   Why: If you want to continue seeing Dr. Toy Care, please call and schedule a follow up appointment within three days of discharge. Thank you! Contact information: [address] [place], [place] [address] Phone: [phone] Fax: [phone]          Next level of care provider has access to [place] and Suicide Prevention discussed: Yes,  SPE completed with pt's sister.   Have you used any form of tobacco in the last 30 days? (Cigarettes, Smokeless Tobacco, Cigars, and/or Pipes): No  Has patient been referred to the Quitline?: N/A patient is not a smoker  Patient has been referred for addiction treatment: [place], LCSW 03/30/2019, 9:46 AM

## 2019-03-30 NOTE — Discharge Summary (Signed)
Physician Discharge Summary Note  Patient:  Jeanette Rose is an 63 y.o., female MRN:  [id number] DOB:  12-24-1955 Patient phone:  [phone] (home)  Patient address:   [place] [address],  Total Time spent with patient: 45 minutes  Date of Admission:  03/15/2019 Date of Discharge: March 30, 2019  Reason for Admission: Transferred from [facility] for consideration of ECT treatment for severe major depression  Principal Problem: Severe recurrent major depression without psychotic features [facility]) Discharge Diagnoses: Principal Problem:   Severe recurrent major depression without psychotic features ([place]) Active Problems:   Type 2 diabetes mellitus with diabetic polyneuropathy, with long-term current use of insulin (H[facility])   GERD (gastroesophageal reflux disease)   Hypertension   Obesity (BMI 30-39.9)   Constipation   History of hypothyroidism   Major depression   Past Psychiatric History: History of depression and anxiety going back years.  Recent worsening.  Recent suicidal thinking.  History also of some hoarding behavior and obsessive like anxiety at times  Past Medical History:  Past Medical History:  Diagnosis Date  . Acute acalculous cholecystitis s/p lap chole 04/03/2014 04/02/2014  . Allergy   . Anemia   . Anxiety   . Asthma   . Depression   . Fatty liver    noted on CT per [facility] records  . Fibromyalgia   . GERD (gastroesophageal reflux disease)   . Hyperlipidemia associated with type 2 diabetes mellitus ([place])   . Hypertension   . Hypothyroid 2015   Transiently in 2015. Synthroid 39mg was stopped 05/07/2016 w/ tsh nml following.  . Obesity (BMI 30-39.9)   . OSA (obstructive sleep apnea)    non-compliant with CPAP  . Seborrheic dermatitis    [facility] face and scalp[facility]  . Stroke ([facility]    TIA  . Type 2 diabetes mellitus ([place]     Past Surgical History:  Procedure Laterality Date  . BREAST REDUCTION SURGERY   06/2008  . CHOLECYSTECTOMY N/A 04/03/2014   Procedure: LAPAROSCOPIC CHOLECYSTECTOMY ;  Surgeon: SAdin Hector MD;  Location: [facility] ORS;  Service: General;  Laterality: N/A;  . REDUCTION MAMMAPLASTY    . TUBAL LIGATION     Family History:  Family History  Problem Relation Age of Onset  . Atrial fibrillation Mother   . Heart disease Father        CAD  . Diabetes Brother   . Cancer Maternal Grandmother   . Diabetes Paternal Grandmother   . Fibromyalgia Sister   . Colitis Sister    Family Psychiatric  History: None clearly reported Social History:  Social History   Substance and Sexual Activity  Alcohol Use No     Social History   Substance and Sexual Activity  Drug Use No    Social History   Socioeconomic History  . Marital status: Divorced    Spouse name: Not on file  . Number of children: Not on file  . Years of education: Not on file  . Highest education level: Not on file  Occupational History  . Not on file  Social Needs  . Financial resource strain: Not on file  . Food insecurity    Worry: Not on file    Inability: Not on file  . Transportation needs    Medical: Not on file    Non-medical: Not on file  Tobacco Use  . Smoking status: Former Smoker    Packs/day: 4.00    Years: 15.00  Pack years: 60.00    Types: Cigarettes    Quit date: 04/22/2004    Years since quitting: 14.9  . Smokeless tobacco: Never Used  . Tobacco comment: 4 pack year hx  Substance and Sexual Activity  . Alcohol use: No  . Drug use: No  . Sexual activity: Never  Lifestyle  . Physical activity    Days per week: Not on file    Minutes per session: Not on file  . Stress: Not on file  Relationships  . Social Herbalist on phone: Not on file    Gets together: Not on file    Attends religious service: Not on file    Active member of club or organization: Not on file    Attends meetings of clubs or organizations: Not on file    Relationship status: Not on file  Other  Topics Concern  . Not on file  Social History Narrative  . Not on file    Hospital Course: Patient was agreeable to beginning ECT treatment.  She had 6 right unilateral ECT treatments during her time in the hospital.  All of these were without any complication.  Patient appeared to show gradual response as would be expected to ECT.  Over the last couple she appears to have plateaued.  Meanwhile up we have made some changes to her medication.  These have been done very gently as the patient is very sensitive to changes in medicine and extremely focused on her perception of side effects.  Gradually continued to withdraw Cymbalta which had appeared to be ineffective.  Gradually added Lexapro for depression and anxiety.  BuSpar was also added.  Over the whole course of her hospitalization her dose of Ativan has been cut down to 1 mg twice a day.  Patient has been focused on this and requested further tapering but when that is been tried she gets extremely anxious and jittery.  I have advised her then that she should continue with the current dosage and follow-up with her outpatient doctor.  We did not set the patient up for maintenance ECT but I would not rule that out as being helpful and have advised her of this.  If she or her outpatient psychiatrist feels that maintenance treatment could play a role for her and if she was able to manage the transportation she should give my office a call.  Otherwise follow-up with Dr. Toy Care  Physical Findings: AIMS:  , ,  ,  ,    CIWA:    COWS:     Musculoskeletal: Strength & Muscle Tone: within normal limits Gait & Station: normal Patient leans: N/A  Psychiatric Specialty Exam: Physical Exam  Nursing note and vitals reviewed. Constitutional: She appears well-developed and well-nourished.  HENT:  Head: Normocephalic and atraumatic.  Eyes: Pupils are equal, round, and reactive to light. Conjunctivae are normal.  Neck: Normal range of motion.  Cardiovascular:  Regular rhythm and normal heart sounds.  Respiratory: Effort normal.  GI: Soft.  Musculoskeletal: Normal range of motion.  Neurological: She is alert.  Skin: Skin is warm and dry.  Psychiatric: Her speech is normal and behavior is normal. Judgment and thought content normal. Cognition and memory are normal.    Review of Systems  Constitutional: Negative.   HENT: Negative.   Eyes: Negative.   Respiratory: Negative.   Cardiovascular: Negative.   Gastrointestinal: Negative.   Musculoskeletal: Negative.   Skin: Negative.   Neurological: Negative.   Psychiatric/Behavioral: Negative for  depression, hallucinations, memory loss, substance abuse and suicidal ideas. The patient is nervous/anxious. The patient does not have insomnia.     Blood pressure 130/81, pulse (!) 112, temperature 98.7 F (37.1 C), temperature source Oral, resp. rate 17, height 5' 4"  (1.626 m), weight 76.2 kg, SpO2 97 %.Body mass index is 28.84 kg/m.  General Appearance: Casual  Eye Contact:  Fair  Speech:  Normal Rate  Volume:  Normal  Mood:  Euthymic  Affect:  Constricted  Thought Process:  Goal Directed  Orientation:  Full (Time, Place, and Person)  Thought Content:  Logical  Suicidal Thoughts:  No  Homicidal Thoughts:  No  Memory:  Immediate;   Fair Recent;   Fair Remote;   Fair  Judgement:  Fair  Insight:  Shallow  Psychomotor Activity:  Decreased  Concentration:  Concentration: Fair  Recall:  [place] of Knowledge:  Fair  Language:  Fair  Akathisia:  No  Handed:  Right  AIMS (if indicated):     Assets:  Desire for Improvement Housing Resilience Social Support  ADL's:  Intact  Cognition:  Impaired,  Mild  Sleep:  Number of Hours: 7     Have you used any form of tobacco in the last 30 days? (Cigarettes, Smokeless Tobacco, Cigars, and/or Pipes): No  Has this patient used any form of tobacco in the last 30 days? (Cigarettes, Smokeless Tobacco, Cigars, and/or Pipes) Yes, No  Blood Alcohol  level:  Lab Results  Component Value Date   ETH <10 30/16/0109    Metabolic Disorder Labs:  Lab Results  Component Value Date   HGBA1C 6.3 (H) 03/09/2019   MPG 134.11 03/09/2019   MPG 180 (H) 04/03/2014   No results found for: PROLACTIN Lab Results  Component Value Date   CHOL 174 10/09/2018   TRIG 210 (H) 10/09/2018   HDL 44 10/09/2018   CHOLHDL 4.0 10/09/2018   [place] 88 10/09/2018   [place] 77 06/28/2018    See Psychiatric Specialty Exam and Suicide Risk Assessment completed by Attending Physician prior to discharge.  Discharge destination:  Home  Is patient on multiple antipsychotic therapies at discharge:  No   Has Patient had three or more failed trials of antipsychotic monotherapy by history:  No  Recommended Plan for Multiple Antipsychotic Therapies: NA  Discharge Instructions    Diet - low sodium heart healthy   Complete by: As directed    Increase activity slowly   Complete by: As directed      Allergies as of 03/30/2019      Reactions   Farxiga [dapagliflozin] Anxiety, Other (See Comments)   Made depression and anxiety worse   Rexulti [brexpiprazole] Anxiety, Other (See Comments)   Made anxiety worse   Trintellix [vortioxetine] Anxiety, Other (See Comments)   Made anxiety worse      Medication List    STOP taking these medications   Aspirin Low Dose 81 MG tablet Generic drug: aspirin Replaced by: aspirin 81 MG EC tablet   feeding supplement (ENSURE ENLIVE) Liqd   multivitamin with minerals Tabs tablet   nitrofurantoin (macrocrystal-monohydrate) 100 MG capsule Commonly known as: MACROBID   nortriptyline 25 MG capsule Commonly known as: PAMELOR   Oxcarbazepine 300 MG tablet Commonly known as: TRILEPTAL   polyethylene glycol 17 g packet Commonly known as: MIRALAX / GLYCOLAX   thiamine 100 MG tablet     TAKE these medications     Indication  amoxicillin 500 MG capsule Commonly known as: AMOXIL Take 1  capsule (500 mg total) by  mouth every 12 (twelve) hours.  Indication: Periodontitis   aspirin 81 MG EC tablet Take 1 tablet (81 mg total) by mouth daily. Replaces: Aspirin Low Dose 81 MG tablet  Indication: Stable Angina Pectoris   atorvastatin 80 MG tablet Commonly known as: LIPITOR Take 1 tablet (80 mg total) by mouth daily at 6 PM.  Indication: High Amount of Fats in the Blood   busPIRone 15 MG tablet Commonly known as: BUSPAR Take 1 tablet (15 mg total) by mouth 3 (three) times daily.  Indication: Anxiety Disorder, Major Depressive Disorder   docusate sodium 100 MG capsule Commonly known as: COLACE Take 1 capsule (100 mg total) by mouth 2 (two) times daily.  Indication: Constipation   DULoxetine 20 MG capsule Commonly known as: CYMBALTA Take 1 capsule (20 mg total) by mouth daily. What changed:   medication strength  how much to take  Indication: Major Depressive Disorder   escitalopram 10 MG tablet Commonly known as: LEXAPRO Take 1 tablet (10 mg total) by mouth daily.  Indication: Generalized Anxiety Disorder, Major Depressive Disorder   hydrOXYzine 50 MG tablet Commonly known as: ATARAX/VISTARIL Take 1 tablet (50 mg total) by mouth every 6 (six) hours as needed for anxiety. What changed:   medication strength  how much to take  when to take this  Indication: Feeling Anxious   irbesartan 150 MG tablet Commonly known as: AVAPRO Take 1 tablet (150 mg total) by mouth daily.  Indication: High Blood Pressure Disorder   LORazepam 1 MG tablet Commonly known as: ATIVAN Take 1 tablet (1 mg total) by mouth 2 (two) times daily. What changed:   medication strength  how much to take  when to take this  Another medication with the same name was removed. Continue taking this medication, and follow the directions you see here.  Indication: Feeling Anxious   metformin 1000 MG (OSM) 24 hr tablet Commonly known as: FORTAMET Take 1 tablet (1,000 mg total) by mouth 2 (two) times daily  with a meal.  Indication: Type 2 Diabetes   pantoprazole 40 MG tablet Commonly known as: PROTONIX Take 1 tablet (40 mg total) by mouth every morning.  Indication: Gastroesophageal Reflux Disease      Follow-up Information    [facility] Follow up.   Why: If you want to follow up with the provider in [facility] who accepts your insurance, please call and schedule an appointment with Dr. Lendon Colonel. Thank you! Contact information: [address] [place], [place] [address] Phone: [place] Follow up.   Why: If you want to continue seeing Dr. Toy Care, please call and schedule a follow up appointment within three days of discharge. Thank you! Contact information: [address] [place], [place] [address] Phone: [phone] Fax: [phone]          Follow-up recommendations:  Activity:  Activity as tolerated Diet:  Regular diet Other:  Follow-up with your usual outpatient psychiatrist.  Comments: Patient could potentially be a candidate for maintenance ECT if she can arrange the transportation and if she were to believe that it would be helpful.  She can contact our office if needed in the future but in the meanwhile should follow-up with Dr.Kaur.  Signed: Alethia Berthold, MD 03/30/2019, 10:04 AM

## 2019-03-30 NOTE — Progress Notes (Signed)
Patient denies SI/HI, denies A/V hallucinations. Patient verbalizes understanding of discharge instructions, follow up care and prescriptions.10 days medicines given to patient. Patient given all belongings from [facility] locker. Patient escorted out by staff, transported by family.

## 2019-03-30 NOTE — Tx Team (Signed)
Interdisciplinary Treatment and Diagnostic Plan Update  03/30/2019 Time of Session: 10:30am Jeanette Rose MRN: [id number]  Principal Diagnosis: Severe recurrent major depression without psychotic features [facility])  Secondary Diagnoses: Principal Problem:   Severe recurrent major depression without psychotic features ([place]) Active Problems:   Type 2 diabetes mellitus with diabetic polyneuropathy, with long-term current use of insulin (HCC)   GERD (gastroesophageal reflux disease)   Hypertension   Obesity (BMI 30-39.9)   Constipation   History of hypothyroidism   Major depression   Current Medications:  Current Facility-Administered Medications  Medication Dose Route Frequency Provider Last Rate Last Dose  . acetaminophen (TYLENOL) tablet 650 mg  650 mg Oral Q6H PRN Clapacs, Madie Reno, MD   650 mg at 03/26/19 2015  . alum & mag hydroxide-simeth (MAALOX/MYLANTA) 200-200-20 MG/5ML suspension 30 mL  30 mL Oral Q4H PRN Clapacs, John T, MD      . amoxicillin (AMOXIL) capsule 500 mg  500 mg Oral Q12H Clapacs, Madie Reno, MD   500 mg at 03/30/19 0754  . aspirin EC tablet 81 mg  81 mg Oral Daily Clapacs, Madie Reno, MD   81 mg at 03/30/19 0754  . atorvastatin (LIPITOR) tablet 80 mg  80 mg Oral q1800 Clapacs, Madie Reno, MD   80 mg at 03/29/19 1730  . busPIRone (BUSPAR) tablet 15 mg  15 mg Oral TID Sharma Covert, MD   15 mg at 03/30/19 0754  . docusate sodium (COLACE) capsule 100 mg  100 mg Oral BID Clapacs, Madie Reno, MD   100 mg at 03/30/19 0754  . DULoxetine (CYMBALTA) DR capsule 20 mg  20 mg Oral Daily Clapacs, Madie Reno, MD   20 mg at 03/30/19 0756  . escitalopram (LEXAPRO) tablet 10 mg  10 mg Oral Daily Clapacs, Madie Reno, MD   10 mg at 03/30/19 0754  . feeding supplement (ENSURE ENLIVE) (ENSURE ENLIVE) liquid 237 mL  237 mL Oral BID BM Clapacs, John T, MD   237 mL at 03/27/19 2100  . hydrocortisone cream 1 %   Topical BID PRN Clapacs, Madie Reno, MD   1 application at 61/47/09 1601  . hydrOXYzine (ATARAX/VISTARIL)  tablet 50 mg  50 mg Oral Q6H PRN Clapacs, Madie Reno, MD   50 mg at 03/26/19 1852  . ibuprofen (ADVIL) tablet 600 mg  600 mg Oral Q6H PRN Clapacs, Madie Reno, MD   600 mg at 03/27/19 1657  . insulin aspart (novoLOG) injection 0-15 Units  0-15 Units Subcutaneous TID WC Clapacs, Madie Reno, MD   3 Units at 03/30/19 0754  . irbesartan (AVAPRO) tablet 150 mg  150 mg Oral Daily Clapacs, Madie Reno, MD   150 mg at 03/30/19 0755  . LORazepam (ATIVAN) tablet 1 mg  1 mg Oral BID Clapacs, Madie Reno, MD   1 mg at 03/30/19 0754  . magnesium hydroxide (MILK OF MAGNESIA) suspension 30 mL  30 mL Oral Daily PRN Clapacs, Madie Reno, MD   30 mL at 03/24/19 1337  . menthol-cetylpyridinium (CEPACOL) lozenge 3 mg  1 lozenge Oral PRN Clapacs, John T, MD      . metFORMIN (GLUCOPHAGE-XR) 24 hr tablet 1,000 mg  1,000 mg Oral BID WC Clapacs, Madie Reno, MD   1,000 mg at 03/30/19 0756  . multivitamin with minerals tablet 1 tablet  1 tablet Oral Daily Clapacs, Madie Reno, MD   1 tablet at 03/30/19 0754  . pantoprazole (PROTONIX) EC tablet 40 mg  40 mg Oral BH-q7a Clapacs, Madie Reno,  MD   40 mg at 03/30/19 0624  . senna-docusate (Senokot-S) tablet 1 tablet  1 tablet Oral QHS Sharma Covert, MD   1 tablet at 03/29/19 2122  . thiamine (VITAMIN B-1) tablet 100 mg  100 mg Oral Daily Clapacs, Madie Reno, MD   100 mg at 03/30/19 0754   PTA Medications: Medications Prior to Admission  Medication Sig Dispense Refill Last Dose  . aspirin (ASPIRIN LOW DOSE) 81 MG tablet Take 81 mg by mouth daily.    03/15/2019  . DULoxetine (CYMBALTA) 60 MG capsule Take 1 capsule (60 mg total) by mouth daily. 1 capsule 0 03/15/2019  . feeding supplement, ENSURE ENLIVE, (ENSURE ENLIVE) LIQD Take 237 mLs by mouth 2 (two) times daily between meals. 1 mL 0 03/15/2019  . hydrOXYzine (ATARAX/VISTARIL) 25 MG tablet Take 1 tablet (25 mg total) by mouth 3 (three) times daily as needed for anxiety. 1 tablet 0 03/19/2019 at 0918  . LORazepam (ATIVAN) 0.5 MG tablet Take 3 tablets (1.5 mg total) by  mouth at bedtime. 1 tablet 0 03/18/2019 at Unknown time  . LORazepam (ATIVAN) 1 MG tablet Take 1 tablet (1 mg total) by mouth every 8 (eight) hours as needed (withdrawal symptoms). 1 tablet 0 03/19/2019 at [place]  . metFORMIN (FORTAMET) 1000 MG (OSM) 24 hr tablet Take 1 tablet (1,000 mg total) by mouth 2 (two) times daily with a meal. 1 tablet 0 03/15/2019  . Multiple Vitamin (MULTIVITAMIN WITH MINERALS) TABS tablet Take 1 tablet by mouth daily. 1 tablet 0 03/15/2019  . nitrofurantoin, macrocrystal-monohydrate, (MACROBID) 100 MG capsule Take 1 capsule (100 mg total) by mouth every 12 (twelve) hours. 5 capsule 0 03/15/2019  . nortriptyline (PAMELOR) 25 MG capsule Take 1 capsule (25 mg total) by mouth at bedtime. 1 capsule 0 03/15/2019  . nortriptyline (PAMELOR) 25 MG capsule Take 1 capsule (25 mg total) by mouth daily. 1 capsule 0 03/15/2019  . Oxcarbazepine (TRILEPTAL) 300 MG tablet Take 1 tablet (300 mg total) by mouth 2 (two) times daily. 1 tablet 0 03/15/2019  . pantoprazole (PROTONIX) 40 MG tablet Take 1 tablet (40 mg total) by mouth every morning. 1 tablet 0 03/26/2019 at Unknown time  . polyethylene glycol (MIRALAX / GLYCOLAX) 17 g packet Take 17 g by mouth daily as needed for mild constipation or moderate constipation. 1 each 0 03/15/2019  . thiamine 100 MG tablet Take 1 tablet (100 mg total) by mouth daily. 1 tablet 0 03/15/2019  . [DISCONTINUED] atorvastatin (LIPITOR) 80 MG tablet TAKE 1 TABLET (80 MG TOTAL) BY MOUTH DAILY AT 6 PM. 90 tablet 0 03/15/2019  . [DISCONTINUED] irbesartan (AVAPRO) 150 MG tablet Take 1 tablet (150 mg total) by mouth daily. 1 tablet 0 03/19/2019 at 0848    Patient Stressors: Health problems Medication change or noncompliance  Patient Strengths: Ability for insight Average or above average intelligence Capable of independent living Communication skills Supportive family/friends  Treatment Modalities: Medication Management, Group therapy, Case management,  1 to 1 session  with clinician, Psychoeducation, Recreational therapy.   Physician Treatment Plan for Primary Diagnosis: Severe recurrent major depression without psychotic features ([place]) Long Term Goal(s): Improvement in symptoms so as ready for discharge Improvement in symptoms so as ready for discharge   Short Term Goals: Ability to verbalize feelings will improve Ability to disclose and discuss suicidal ideas Ability to demonstrate self-control will improve Ability to maintain clinical measurements within normal limits will improve Compliance with prescribed medications will improve  Medication Management: Evaluate patient's  response, side effects, and tolerance of medication regimen.  Therapeutic Interventions: 1 to 1 sessions, Unit Group sessions and Medication administration.  Evaluation of Outcomes: Adequate for Discharge  Physician Treatment Plan for Secondary Diagnosis: Principal Problem:   Severe recurrent major depression without psychotic features ([place]) Active Problems:   Type 2 diabetes mellitus with diabetic polyneuropathy, with long-term current use of insulin (HCC)   GERD (gastroesophageal reflux disease)   Hypertension   Obesity (BMI 30-39.9)   Constipation   History of hypothyroidism   Major depression  Long Term Goal(s): Improvement in symptoms so as ready for discharge Improvement in symptoms so as ready for discharge   Short Term Goals: Ability to verbalize feelings will improve Ability to disclose and discuss suicidal ideas Ability to demonstrate self-control will improve Ability to maintain clinical measurements within normal limits will improve Compliance with prescribed medications will improve     Medication Management: Evaluate patient's response, side effects, and tolerance of medication regimen.  Therapeutic Interventions: 1 to 1 sessions, Unit Group sessions and Medication administration.  Evaluation of Outcomes: Adequate for Discharge   RN Treatment Plan  for Primary Diagnosis: Severe recurrent major depression without psychotic features ([place]) Long Term Goal(s): Knowledge of disease and therapeutic regimen to maintain health will improve  Short Term Goals: Ability to verbalize feelings will improve, Ability to identify and develop effective coping behaviors will improve and Compliance with prescribed medications will improve  Medication Management: RN will administer medications as ordered by provider, will assess and evaluate patient's response and provide education to patient for prescribed medication. RN will report any adverse and/or side effects to prescribing provider.  Therapeutic Interventions: 1 on 1 counseling sessions, Psychoeducation, Medication administration, Evaluate responses to treatment, Monitor vital signs and CBGs as ordered, Perform/monitor CIWA, COWS, AIMS and Fall Risk screenings as ordered, Perform wound care treatments as ordered.  Evaluation of Outcomes: Adequate for Discharge   LCSW Treatment Plan for Primary Diagnosis: Severe recurrent major depression without psychotic features ([place]) Long Term Goal(s): Safe transition to appropriate next level of care at discharge, Engage patient in therapeutic group addressing interpersonal concerns.  Short Term Goals: Engage patient in aftercare planning with referrals and resources, Increase emotional regulation, Facilitate acceptance of mental health diagnosis and concerns and Increase skills for wellness and recovery  Therapeutic Interventions: Assess for all discharge needs, 1 to 1 time with Social worker, Explore available resources and support systems, Assess for adequacy in community support network, Educate family and significant other(s) on suicide prevention, Complete Psychosocial Assessment, Interpersonal group therapy.  Evaluation of Outcomes: Adequate for Discharge   Progress in Treatment: Attending groups: Yes. Participating in groups: No. Taking medication as  prescribed: Yes. Toleration medication: Yes. Family/Significant other contact made: Yes, individual(s) contacted:  [name], sister [phone] Patient understands diagnosis: Yes. Discussing patient identified problems/goals with staff: Yes. Medical problems stabilized or resolved: Yes. Denies suicidal/homicidal ideation: Yes. Issues/concerns per patient self-inventory: No. Other: NA  New problem(s) identified: No, Describe:  none reported  New Short Term/Long Term Goal(s): Attend outpatient treatment, take medication as prescribed, develop and implement healthy coping methods to manage stress.  Patient Goals:  "To get ECT and get well"  Discharge Plan or Barriers: Pt will return home and receive outpatient treatment. 03/21/19-Pt is receiving ECT, reports somatic complaints. D/C plan TBD. Update 03/26/19: Pt unsure of where she wants to follow up ; Pt has option of Dr. Toy Care or [place] with Dr. Erling Cruz. Update 03/30/19:  Patient is expected to  be discharged back to her home.  Patient has follow up information and expresses no concerns at this time.    Reason for Continuation of Hospitalization: Depression Medication stabilization  Estimated Length of Stay:5-7 days  Recreational Therapy: Patient Stressors: N/A  Patient Goal: Patient will engage in groups without prompting or encouragement from LRT x3 group sessions within 5 recreation therapy group sessions    Attendees: Patient: Jeanette Rose 03/30/2019 11:03 AM  Physician: Dr. Weber Cooks, MD 03/30/2019 11:03 AM  Nursing:  03/30/2019 11:03 AM  RN Care Manager: 03/30/2019 11:03 AM  Social Worker: Assunta Curtis LCSW 03/30/2019 11:03 AM  Recreational Therapist:  03/30/2019 11:03 AM  Other:  03/30/2019 11:03 AM  Other:  03/30/2019 11:03 AM  Other: 03/30/2019 11:03 AM    Scribe for Treatment Team: Rozann Lesches, LCSW 03/30/2019 11:03 AM

## 2019-03-30 NOTE — Progress Notes (Signed)
Recreation Therapy Notes   Date: 03/30/2019  Time: 9:30 am   Location: Craft room   Behavioral response: N/A   Intervention Topic: Leisure  Discussion/Intervention: Patient did not attend group.   Clinical Observations/Feedback:  Patient did not attend group.   Kalid Ghan LRT/CTRS        Duff Pozzi 03/30/2019 10:27 AM

## 2019-03-30 NOTE — BHH Group Notes (Signed)
LCSW Group Therapy Note  03/30/2019 1:00 PM  Type of Therapy and Topic:  Group Therapy:  Feelings around Relapse and Recovery  Participation Level:  Did Not Attend   Description of Group:    Patients in this group will discuss emotions they experience before and after a relapse. They will process how experiencing these feelings, or avoidance of experiencing them, relates to having a relapse. Facilitator will guide patients to explore emotions they have related to recovery. Patients will be encouraged to process which emotions are more powerful. They will be guided to discuss the emotional reaction significant others in their lives may have to their relapse or recovery. Patients will be assisted in exploring ways to respond to the emotions of others without this contributing to a relapse.  Therapeutic Goals: 1. Patient will identify two or more emotions that lead to a relapse for them 2. Patient will identify two emotions that result when they relapse 3. Patient will identify two emotions related to recovery 4. Patient will demonstrate ability to communicate their needs through discussion and/or role plays   Summary of Patient Progress: X  Therapeutic Modalities:   Cognitive Behavioral Therapy Solution-Focused Therapy Assertiveness Training Relapse Prevention Therapy   Jeanette Rose, MSW, LCSW 03/30/2019 1:37 PM

## 2019-04-01 ENCOUNTER — Emergency Department (HOSPITAL_COMMUNITY)
Admission: EM | Admit: 2019-04-01 | Discharge: 2019-04-01 | Disposition: A | Discharge status: Home / Self Care | Payer: BLUE CROSS/BLUE SHIELD | Attending: Emergency Medicine | Admitting: Emergency Medicine

## 2019-04-01 ENCOUNTER — Other Ambulatory Visit: Payer: Self-pay

## 2019-04-01 DIAGNOSIS — F332 Major depressive disorder, recurrent severe without psychotic features: Secondary | ICD-10-CM | POA: Diagnosis not present

## 2019-04-01 DIAGNOSIS — Z87891 Personal history of nicotine dependence: Secondary | ICD-10-CM | POA: Insufficient documentation

## 2019-04-01 DIAGNOSIS — Z7982 Long term (current) use of aspirin: Secondary | ICD-10-CM | POA: Insufficient documentation

## 2019-04-01 DIAGNOSIS — Z03818 Encounter for observation for suspected exposure to other biological agents ruled out: Secondary | ICD-10-CM | POA: Diagnosis not present

## 2019-04-01 DIAGNOSIS — F32A Depression, unspecified: Secondary | ICD-10-CM

## 2019-04-01 DIAGNOSIS — F419 Anxiety disorder, unspecified: Secondary | ICD-10-CM

## 2019-04-01 DIAGNOSIS — Z79899 Other long term (current) drug therapy: Secondary | ICD-10-CM | POA: Diagnosis not present

## 2019-04-01 DIAGNOSIS — E119 Type 2 diabetes mellitus without complications: Secondary | ICD-10-CM | POA: Diagnosis not present

## 2019-04-01 DIAGNOSIS — E039 Hypothyroidism, unspecified: Secondary | ICD-10-CM | POA: Insufficient documentation

## 2019-04-01 DIAGNOSIS — Z7984 Long term (current) use of oral hypoglycemic drugs: Secondary | ICD-10-CM | POA: Insufficient documentation

## 2019-04-01 DIAGNOSIS — J45909 Unspecified asthma, uncomplicated: Secondary | ICD-10-CM | POA: Insufficient documentation

## 2019-04-01 DIAGNOSIS — I10 Essential (primary) hypertension: Secondary | ICD-10-CM | POA: Insufficient documentation

## 2019-04-01 DIAGNOSIS — F329 Major depressive disorder, single episode, unspecified: Secondary | ICD-10-CM

## 2019-04-01 LAB — COMPREHENSIVE METABOLIC PANEL
ALT: 63 U/L — ABNORMAL HIGH (ref 0–44)
AST: 54 U/L — ABNORMAL HIGH (ref 15–41)
Albumin: 4.7 g/dL (ref 3.5–5.0)
Alkaline Phosphatase: 93 U/L (ref 38–126)
Anion gap: 16 — ABNORMAL HIGH (ref 5–15)
BUN: 12 mg/dL (ref 8–23)
CO2: 21 mmol/L — ABNORMAL LOW (ref 22–32)
Calcium: 10.5 mg/dL — ABNORMAL HIGH (ref 8.9–10.3)
Chloride: 101 mmol/L (ref 98–111)
Creatinine, Ser: 0.77 mg/dL (ref 0.44–1.00)
GFR calc Af Amer: >60 mL/min (ref >60)
GFR calc non Af Amer: >60 mL/min (ref >60)
Glucose, Bld: 205 mg/dL — ABNORMAL HIGH (ref 70–99)
Potassium: 3.9 mmol/L (ref 3.5–5.1)
Sodium: 138 mmol/L (ref 135–145)
Total Bilirubin: 0.6 mg/dL (ref 0.3–1.2)
Total Protein: 8.3 g/dL — ABNORMAL HIGH (ref 6.5–8.1)

## 2019-04-01 LAB — CBC WITH DIFFERENTIAL/PLATELET
Abs Immature Granulocytes: 0.03 10*3/uL (ref 0.00–0.07)
Basophils Absolute: 0.1 10*3/uL (ref 0.0–0.1)
Basophils Relative: 1 %
Eosinophils Absolute: 0.2 10*3/uL (ref 0.0–0.5)
Eosinophils Relative: 2 %
HCT: 47.7 % — ABNORMAL HIGH (ref 36.0–46.0)
Hemoglobin: 15.7 g/dL — ABNORMAL HIGH (ref 12.0–15.0)
Immature Granulocytes: 0 %
Lymphocytes Relative: 21 %
Lymphs Abs: 2.4 10*3/uL (ref 0.7–4.0)
MCH: 30 pg (ref 26.0–34.0)
MCHC: 32.9 g/dL (ref 30.0–36.0)
MCV: 91 fL (ref 80.0–100.0)
Monocytes Absolute: 0.6 10*3/uL (ref 0.1–1.0)
Monocytes Relative: 5 %
Neutro Abs: 7.8 10*3/uL — ABNORMAL HIGH (ref 1.7–7.7)
Neutrophils Relative %: 71 %
Platelets: 338 10*3/uL (ref 150–400)
RBC: 5.24 MIL/uL — ABNORMAL HIGH (ref 3.87–5.11)
RDW: 13.2 % (ref 11.5–15.5)
WBC: 11 10*3/uL — ABNORMAL HIGH (ref 4.0–10.5)
nRBC: 0 % (ref 0.0–0.2)

## 2019-04-01 LAB — RAPID URINE DRUG SCREEN, HOSP PERFORMED
Amphetamines: NOT DETECTED
Barbiturates: NOT DETECTED
Benzodiazepines: POSITIVE — AB
Cocaine: NOT DETECTED
Opiates: NOT DETECTED
Tetrahydrocannabinol: NOT DETECTED

## 2019-04-01 LAB — ETHANOL: Alcohol, Ethyl (B): <10 mg/dL (ref <10)

## 2019-04-01 LAB — SARS CORONAVIRUS 2 BY RT PCR (HOSPITAL ORDER, PERFORMED IN ~~LOC~~ HOSPITAL LAB): SARS Coronavirus 2: NEGATIVE

## 2019-04-01 LAB — CBG MONITORING, ED: Glucose-Capillary: 211 mg/dL — ABNORMAL HIGH (ref 70–99)

## 2019-04-01 LAB — SALICYLATE LEVEL: Salicylate Lvl: <7 mg/dL (ref 2.8–30.0)

## 2019-04-01 LAB — ACETAMINOPHEN LEVEL: Acetaminophen (Tylenol), Serum: <10 ug/mL — ABNORMAL LOW (ref 10–30)

## 2019-04-01 MED ORDER — HYDROXYZINE HCL 25 MG PO TABS: 50.0000 mg | ORAL_TABLET | Freq: Four times a day (QID) | ORAL | Status: DC | PRN

## 2019-04-01 MED ORDER — ESCITALOPRAM OXALATE 10 MG PO TABS: 10.0000 mg | ORAL_TABLET | Freq: Every day | ORAL | Status: DC

## 2019-04-01 MED ORDER — IRBESARTAN 150 MG PO TABS
150.0000 mg | ORAL_TABLET | Freq: Every day | ORAL | Status: DC
  Administered 2019-04-01: 150 mg via ORAL
  Filled 2019-04-01: qty 1

## 2019-04-01 MED ORDER — DULOXETINE HCL 20 MG PO CPEP: 20.0000 mg | ORAL_CAPSULE | Freq: Every day | ORAL | Status: DC

## 2019-04-01 MED ORDER — LORAZEPAM 1 MG PO TABS: 1.0000 mg | ORAL_TABLET | Freq: Two times a day (BID) | ORAL | Status: DC

## 2019-04-01 MED ORDER — LORAZEPAM 1 MG PO TABS
2.0000 mg | ORAL_TABLET | Freq: Once | ORAL | Status: AC
  Administered 2019-04-01: 15:00:00 2 mg via ORAL
  Filled 2019-04-01: qty 2

## 2019-04-01 MED ORDER — ASPIRIN 81 MG PO TBEC: 81.0000 mg | DELAYED_RELEASE_TABLET | Freq: Every day | ORAL | Status: DC

## 2019-04-01 MED ORDER — AMOXICILLIN 500 MG PO CAPS: 500.0000 mg | ORAL_CAPSULE | Freq: Two times a day (BID) | ORAL | Status: DC

## 2019-04-01 MED ORDER — ATORVASTATIN CALCIUM 40 MG PO TABS
80.0000 mg | ORAL_TABLET | Freq: Every day | ORAL | Status: DC
  Administered 2019-04-01: 80 mg via ORAL
  Filled 2019-04-01: qty 2

## 2019-04-01 MED ORDER — METFORMIN HCL ER 500 MG PO TB24
1000.0000 mg | ORAL_TABLET | Freq: Two times a day (BID) | ORAL | Status: DC
  Administered 2019-04-01: 16:00:00 1000 mg via ORAL
  Filled 2019-04-01: qty 2

## 2019-04-01 MED ORDER — PANTOPRAZOLE SODIUM 40 MG PO TBEC: 40.0000 mg | DELAYED_RELEASE_TABLET | ORAL | Status: DC

## 2019-04-01 MED ORDER — BUSPIRONE HCL 10 MG PO TABS
15.0000 mg | ORAL_TABLET | Freq: Three times a day (TID) | ORAL | Status: DC
  Administered 2019-04-01: 16:00:00 15 mg via ORAL
  Filled 2019-04-01: qty 2

## 2019-04-01 MED ORDER — DOCUSATE SODIUM 100 MG PO CAPS: 100.0000 mg | ORAL_CAPSULE | Freq: Two times a day (BID) | ORAL | Status: DC

## 2019-04-01 NOTE — ED Provider Notes (Signed)
Patient was initially seen by Dr. Zenia Resides.  Please see his note.  Laboratory tests reviewed.  Hyperglycemia noted and slight leukocytosis but no significant abnormalities.  Patient's initial tachycardia has improved.  Signs of any serious medical illness.  And has been assessed by behavioral health services.  No indications for inpatient treatment.  Patient was also assessed by social work.  They communicated with the family.  Apparently family did not want to pick the patient up tonight.  Social work was signing off.  Patient appears stable for discharge.   Dorie Rank, MD 04/01/19 [phone]

## 2019-04-01 NOTE — BH Assessment (Signed)
[facility] Assessment Progress Note    Per Jinny Blossom, NP, patient does not meet inpatient admission criteria.  Consult order for Social Work was entered to see if there are any other placement options for patient.

## 2019-04-01 NOTE — ED Provider Notes (Signed)
[facility] DEPT Provider Note   CSN: [id number] Arrival date & time: 04/01/19  1318     History   Chief Complaint Chief Complaint  Patient presents with   Anxiety    HPI Jeanette Rose is a 63 y.o. female.     63 year old female who presents with increased anxiety.  Patient is unsure what is causing her symptoms but does have a history of same.  Also admits to ongoing severe depression.  Was discharged from the hospital 2 days ago after admission for severe major depression and she had received ECT therapy.  Note she feels suicidal at this time without an actual plan.  Has had some occasional memory lapses and states that she has been compliant with her medications.  Denies any history of head injury.  States that her memory lapses involve her short as well as long-term memory.  Denies any homicidal ideations.  Has not been hallucinating.  No recent use of alcohol or illicit drugs.  Nothing makes her symptoms better and no treatment use prior to arrival     Past Medical History:  Diagnosis Date   Acute acalculous cholecystitis s/p lap chole 04/03/2014 04/02/2014   Allergy    Anemia    Anxiety    Asthma    Depression    Fatty liver    noted on CT per [facility] records   Fibromyalgia    GERD (gastroesophageal reflux disease)    Hyperlipidemia associated with type 2 diabetes mellitus ([place])    Hypertension    Hypothyroid 2015   Transiently in 2015. Synthroid 62mg was stopped 05/07/2016 w/ tsh nml following.   Obesity (BMI 30-39.9)    OSA (obstructive sleep apnea)    non-compliant with CPAP   Seborrheic dermatitis    [facility]   Stroke ([facility]    TIA   Type 2 diabetes mellitus ([facility]     Patient Active Problem List   Diagnosis Date Noted   Major depression 03/15/2019   Severe recurrent major depression without psychotic features ([facility] 03/15/2019   GAD (generalized anxiety disorder)  03/04/2019   Benzodiazepine dependence, continuous ([facility] 03/04/2019   MDD (major depressive disorder), recurrent episode, severe ([facility] 03/04/2019   Positive colorectal cancer screening using Cologuard test 02/15/2018   Seborrheic dermatitis    History of hypothyroidism 10/26/2017   Elevated ferritin level 10/26/2017   Vitamin D deficiency 10/26/2017   Diabetic peripheral neuropathy associated with type 2 diabetes mellitus ([facility] 10/26/2017   OSA (obstructive sleep apnea)    Hyperlipidemia LDL goal <100    Major depressive disorder, recurrent episode ([facility]    Constipation 04/22/2014   Steatohepatitis 04/03/2014   Anxiety    Type 2 diabetes mellitus with diabetic polyneuropathy, with long-term current use of insulin (HCC)    GERD (gastroesophageal reflux disease)    Hypertension    Obesity (BMI 30-39.9)     Past Surgical History:  Procedure Laterality Date   BREAST REDUCTION SURGERY  06/2008   CHOLECYSTECTOMY N/A 04/03/2014   Procedure: LAPAROSCOPIC CHOLECYSTECTOMY ;  Surgeon: SAdin Hector MD;  Location: [facility] ORS;  Service: General;  Laterality: N/A;   REDUCTION MAMMAPLASTY     TUBAL LIGATION       OB History   No obstetric history on file.      Home Medications    Prior to Admission medications   Medication Sig Start Date End Date Taking? Authorizing Provider  amoxicillin (AMOXIL) 500 MG capsule Take  1 capsule (500 mg total) by mouth every 12 (twelve) hours. 03/29/19   Clapacs, Madie Reno, MD  aspirin 81 MG EC tablet Take 1 tablet (81 mg total) by mouth daily. 03/30/19   Clapacs, Madie Reno, MD  atorvastatin (LIPITOR) 80 MG tablet Take 1 tablet (80 mg total) by mouth daily at 6 PM. 03/30/19   Clapacs, Madie Reno, MD  busPIRone (BUSPAR) 15 MG tablet Take 1 tablet (15 mg total) by mouth 3 (three) times daily. 03/30/19   Clapacs, Madie Reno, MD  docusate sodium (COLACE) 100 MG capsule Take 1 capsule (100 mg total) by mouth 2 (two) times daily. 03/30/19   Clapacs, Madie Reno, MD    DULoxetine (CYMBALTA) 20 MG capsule Take 1 capsule (20 mg total) by mouth daily. 03/30/19   Clapacs, Madie Reno, MD  escitalopram (LEXAPRO) 10 MG tablet Take 1 tablet (10 mg total) by mouth daily. 03/30/19   Clapacs, Madie Reno, MD  hydrOXYzine (ATARAX/VISTARIL) 50 MG tablet Take 1 tablet (50 mg total) by mouth every 6 (six) hours as needed for anxiety. 03/30/19   Clapacs, Madie Reno, MD  irbesartan (AVAPRO) 150 MG tablet Take 1 tablet (150 mg total) by mouth daily. 03/30/19   Clapacs, Madie Reno, MD  LORazepam (ATIVAN) 1 MG tablet Take 1 tablet (1 mg total) by mouth 2 (two) times daily. 03/30/19   Clapacs, Madie Reno, MD  metformin (FORTAMET) 1000 MG (OSM) 24 hr tablet Take 1 tablet (1,000 mg total) by mouth 2 (two) times daily with a meal. 03/30/19   Clapacs, Madie Reno, MD  pantoprazole (PROTONIX) 40 MG tablet Take 1 tablet (40 mg total) by mouth every morning. 03/30/19   Clapacs, Madie Reno, MD    Family History Family History  Problem Relation Age of Onset   Atrial fibrillation Mother    Heart disease Father        CAD   Diabetes Brother    Cancer Maternal Grandmother    Diabetes Paternal Grandmother    Fibromyalgia Sister    Colitis Sister     Social History Social History   Tobacco Use   Smoking status: Former Smoker    Packs/day: 4.00    Years: 15.00    Pack years: 60.00    Types: Cigarettes    Quit date: 04/22/2004    Years since quitting: 14.9   Smokeless tobacco: Never Used   Tobacco comment: 4 pack year hx  Substance Use Topics   Alcohol use: No   Drug use: No     Allergies   Farxiga [dapagliflozin], Rexulti [brexpiprazole], and Trintellix [vortioxetine]   Review of Systems Review of Systems  All other systems reviewed and are negative.    Physical Exam Updated Vital Signs BP (!) 149/98 (BP Location: Left Arm)    Pulse (!) 121    Temp 98.7 F (37.1 C) (Oral)    Resp 16    Ht 1.626 m (5' 4" )    Wt 77.1 kg    SpO2 97%    BMI 29.18 kg/m   Physical Exam Vitals signs and nursing  note reviewed.  Constitutional:      General: She is not in acute distress.    Appearance: Normal appearance. She is well-developed. She is not toxic-appearing.  HENT:     Head: Normocephalic and atraumatic.  Eyes:     General: Lids are normal.     Conjunctiva/sclera: Conjunctivae normal.     Pupils: Pupils are equal, round, and reactive to light.  Neck:  Musculoskeletal: Normal range of motion and neck supple.     Thyroid: No thyroid mass.     Trachea: No tracheal deviation.  Cardiovascular:     Rate and Rhythm: Normal rate and regular rhythm.     Heart sounds: Normal heart sounds. No murmur. No gallop.   Pulmonary:     Effort: Pulmonary effort is normal. No respiratory distress.     Breath sounds: Normal breath sounds. No stridor. No decreased breath sounds, wheezing, rhonchi or rales.  Abdominal:     General: Bowel sounds are normal. There is no distension.     Palpations: Abdomen is soft.     Tenderness: There is no abdominal tenderness. There is no rebound.  Musculoskeletal: Normal range of motion.        General: No tenderness.  Skin:    General: Skin is warm and dry.     Findings: No abrasion or rash.  Neurological:     Mental Status: She is alert and oriented to person, place, and time.     GCS: GCS eye subscore is 4. GCS verbal subscore is 5. GCS motor subscore is 6.     Cranial Nerves: No cranial nerve deficit.     Sensory: No sensory deficit.  Psychiatric:        Attention and Perception: Attention normal.        Mood and Affect: Mood is anxious.        Speech: Speech normal.        Behavior: Behavior is withdrawn.        Thought Content: Thought content is not paranoid or delusional. Thought content includes suicidal ideation. Thought content does not include suicidal plan.        Cognition and Memory: Memory is impaired.      ED Treatments / Results  Labs (all labs ordered are listed, but only abnormal results are displayed) Labs Reviewed  ETHANOL    RAPID URINE DRUG SCREEN, HOSP PERFORMED  SALICYLATE LEVEL  ACETAMINOPHEN LEVEL  CBC WITH DIFFERENTIAL/PLATELET  COMPREHENSIVE METABOLIC PANEL    EKG None  Radiology No results found.  Procedures Procedures (including critical care time)  Medications Ordered in ED Medications  pantoprazole (PROTONIX) EC tablet 40 mg (has no administration in time range)  metFORMIN (GLUCOPHAGE-XR) 24 hr tablet 1,000 mg (has no administration in time range)  DULoxetine (CYMBALTA) DR capsule 20 mg (has no administration in time range)  escitalopram (LEXAPRO) tablet 10 mg (has no administration in time range)  hydrOXYzine (ATARAX/VISTARIL) tablet 50 mg (has no administration in time range)  irbesartan (AVAPRO) tablet 150 mg (has no administration in time range)  LORazepam (ATIVAN) tablet 1 mg (has no administration in time range)  docusate sodium (COLACE) capsule 100 mg (has no administration in time range)  busPIRone (BUSPAR) tablet 15 mg (has no administration in time range)  atorvastatin (LIPITOR) tablet 80 mg (has no administration in time range)  aspirin EC tablet 81 mg (has no administration in time range)  amoxicillin (AMOXIL) capsule 500 mg (has no administration in time range)     Initial Impression / Assessment and Plan / ED Course  I have reviewed the triage vital signs and the nursing notes.  Pertinent labs & imaging results that were available during my care of the patient were reviewed by me and considered in my medical decision making (see chart for details).        Appears to be anxious here at this time.  Will medicate with Ativan and  labs are pending.  TTS consult placed for likely admission  Final Clinical Impressions(s) / ED Diagnoses   Final diagnoses:  None    ED Discharge Orders    None       Lacretia Leigh, MD 04/01/19 1431

## 2019-04-01 NOTE — ED Notes (Signed)
Pt DC d off unit to home per MD order. Pt alert, cooperative, no s/s of distress. DC information given to and reviewed with pt , with acknowledged understanding. Belongings given to pt. Pt ambulatory off unit, escorted by RN.pt transported by family.

## 2019-04-01 NOTE — ED Triage Notes (Signed)
Pt brought to hospital by sister. Pt to 29. Pt anxious, tearful. Pt stated SI thoughts with no plan. Pt stated, "cant remember anything." pt dressed out and resting comfortably in bed .

## 2019-04-01 NOTE — BH Assessment (Signed)
Tele Assessment Note   Patient Name: Jeanette Rose MRN: [id number] Referring Physician: Dr. Zenia Resides Location of Patient: Jeanette Rose Location of Provider: [place]  Jeanette Rose is an 63 y.o. female who presented to [facility] with severe anxiety, memory loss as well as vague suicidal thoughts.  Patient was just discharged from [facility] two days ago where she had been for over two weeks receiving ECT Treatment.  Patient's main complaint today is that she cannot remember everything and states that her anxiety is severe and she is unable to function.  Patient states that she is not blatantly suicidal, she states, "I just don't want to be here like this."  Patient states that she lives alone and she is fearful because of her memory loss most likely a result of ECT treatment.  However, patient has poor coping mechanisms and her anxiety medicine that she has been taking has been tapered down and she is most likely still experiencing some residual withdrawal from the taper of her Lorazepam from 6-7 mg daily to 2 mg daily.  Patient denies HI/Psychosis.  Patient has no substance use issues other than having abused her prescription medication in the past.  Patient states that since her discharge from [facility] that she had her prescriptions filled and she has been taking her medication as prescribed.  Patient was discharged to follow-up with Dr. Toy Care, but she has not made a follow-up appointment since her discharge from the hospital.  Patient states that she cannot stay by herself because she states that she cannot stay with any family and her family cannot stay with her because her house is a hoarder house and has mold in it.  Patient gave TTS permission to talk with her sister, Jeanette Rose [phone], who states that she does not feel like patient can stay on her own because of her anxiety and her inability to care for herself.  She admitted that patient was most likely not  suicidal, but her anxiety was incapacitating her.  She states that there are no family members that she can stay with and sister states, "is there not a place that she can go and stay until her memory returns?  She was so anxious today that she was hyper-venilating today."  Informed sister that some of patient's behavior was attention seeking with some manipulation in order to get people to help her so that she does not have to do everything for herself.  Sister was able to agree that patient did have some of these behaviors occurring.  Patient presented as oriented and alert.  She was highly anxious and kept stating that she could not remember anything.  However, patient was able to identify all the medicines that she  She has been taking, her sister;s phone number, that she had her medications filled and that she had taken them this morning and she knew who she needed to follow-up with for aftercare services.  Her memory did not appear to be so bad that she would be unable to care for herself.  Patient did not appear to be responding to any internal stimuli.  Patient;s judgment, insight and impulse control appear to be characteristically impaired and she appeared to be at her baseline.  Diagnosis  F33.2 MDD Recurrent Severe without psychosis  Past Medical History:  Past Medical History:  Diagnosis Date  . Acute acalculous cholecystitis s/p lap chole 04/03/2014 04/02/2014  . Allergy   . Anemia   . Anxiety   . Asthma   .  Depression   . Fatty liver    noted on CT per [facility] records  . Fibromyalgia   . GERD (gastroesophageal reflux disease)   . Hyperlipidemia associated with type 2 diabetes mellitus ([place])   . Hypertension   . Hypothyroid 2015   Transiently in 2015. Synthroid 35mg was stopped 05/07/2016 w/ tsh nml following.  . Obesity (BMI 30-39.9)   . OSA (obstructive sleep apnea)    non-compliant with CPAP  . Seborrheic dermatitis    on face and scalp, [facility]  .  Stroke ([facility]    TIA  . Type 2 diabetes mellitus ([place]     Past Surgical History:  Procedure Laterality Date  . BREAST REDUCTION SURGERY  06/2008  . CHOLECYSTECTOMY N/A 04/03/2014   Procedure: LAPAROSCOPIC CHOLECYSTECTOMY ;  Surgeon: SAdin Hector MD;  Location: [facility];  Service: General;  Laterality: N/A;  . REDUCTION MAMMAPLASTY    . TUBAL LIGATION      Family History:  Family History  Problem Relation Age of Onset  . Atrial fibrillation Mother   . Heart disease Father        CAD  . Diabetes Brother   . Cancer Maternal Grandmother   . Diabetes Paternal Grandmother   . Fibromyalgia Sister   . Colitis Sister     Social History:  reports that she quit smoking about 14 years ago. Her smoking use included cigarettes. She has a 60.00 pack-year smoking history. She has never used smokeless tobacco. She reports that she does not drink alcohol or use drugs.  Additional Social History:  Alcohol / Drug Use Pain Medications: see MAR Prescriptions: see MAR Over the Counter: see MAR History of alcohol / drug use?: No history of alcohol / drug abuse Longest period of sobriety (when/how long): N/A  CIWA: CIWA-Ar BP: (!) 149/98 Pulse Rate: (!) 121 COWS:    Allergies:  Allergies  Allergen Reactions  . Farxiga [Dapagliflozin] Anxiety and Other (See Comments)    Made depression and anxiety worse  . Rexulti [Brexpiprazole] Anxiety and Other (See Comments)    Made anxiety worse  . Trintellix [Vortioxetine] Anxiety and Other (See Comments)    Made anxiety worse    Home Medications: (Not in a hospital admission)   OB/GYN Status:  No LMP recorded. Patient is postmenopausal.  General Assessment Data [facility] of Assessment: [facility] TTS Assessment: In system Is this a Tele or Face-to-Face Assessment?: Tele Assessment Is this an Initial Assessment or a Re-assessment for this encounter?: Initial Assessment Patient Accompanied by:: N/A Language Other than English: No Living  Arrangements: Other (Comment)(lives alone in her own mobile home) What gender do you identify as?: Female Marital status: Divorced Living Arrangements: Alone Can pt return to current living arrangement?: Yes Is patient capable of signing voluntary admission?: Yes Referral Source: Self/Family/Friend Insurance type: (Physicist, medical     Crisis Care Plan Living Arrangements: Alone Legal Guardian: Other:(self) Name of Psychiatrist: Dr. KToy CareName of Therapist: none  Education Status Is patient currently in school?: No Is the patient employed, unemployed or receiving disability?: Unemployed  Risk to self with the past 6 months Suicidal Ideation: Yes-Currently Present Has patient been a risk to self within the past 6 months prior to admission? : No Suicidal Intent: No Has patient had any suicidal intent within the past 6 months prior to admission? : No Is patient at risk for suicide?: Yes Suicidal Plan?: No Has patient had any suicidal plan within the past 6 months prior  to admission? : No Specify Current Suicidal Plan: (none) Access to Means: No Specify Access to Suicidal Means: (none) What has been your use of drugs/alcohol within the last 12 months?: none Previous Attempts/Gestures: (none) How many times?: 0 Other Self Harm Risks: poor coping strategies Triggers for Past Attempts: None known Intentional Self Injurious Behavior: None Family Suicide History: No Recent stressful life event(s): Loss (Comment)(patient struggles with loss of son) Persecutory voices/beliefs?: No Depression: Yes Depression Symptoms: Despondent, Insomnia, Tearfulness, Loss of interest in usual pleasures, Feeling worthless/self pity Substance abuse history and/or treatment for substance abuse?: No Suicide prevention information given to non-admitted patients: Not applicable  Risk to Others within the past 6 months Homicidal Ideation: No Does patient have any lifetime risk of violence toward others beyond  the six months prior to admission? : No Thoughts of Harm to Others: No Current Homicidal Intent: No Current Homicidal Plan: No-Not Currently/Within Last 6 Months Access to Homicidal Means: No Identified Victim: none History of harm to others?: No Assessment of Violence: None Noted Violent Behavior Description: none Does patient have access to weapons?: No Criminal Charges Pending?: No Does patient have a court date: No Is patient on probation?: No  Psychosis Hallucinations: None noted Delusions: (none reported)  Mental Status Report Appearance/Hygiene: Unremarkable Eye Contact: Good Motor Activity: Restlessness Speech: Logical/coherent Level of Consciousness: Alert Mood: Depressed, Anxious, Apathetic Affect: Labile Anxiety Level: Severe Thought Processes: Coherent, Relevant Judgement: Partial Orientation: Person, Place, Time, Situation Obsessive Compulsive Thoughts/Behaviors: Moderate     ADLScreening [facility] Assessment Services) Patient's cognitive ability adequate to safely complete daily activities?: Yes Patient able to express need for assistance with ADLs?: Yes Independently performs ADLs?: Yes (appropriate for developmental age)  Prior Inpatient Therapy Prior Inpatient Therapy: Yes Prior Therapy Dates: (just discharged from [facility] yesterday) Prior Therapy Facilty/Provider(s): [facility] Reason for Treatment: (depression and anxiety)  Prior Outpatient Therapy Prior Outpatient Therapy: Yes Prior Therapy Dates: active Prior Therapy Facilty/Provider(s): Dr Toy Care Reason for Treatment: depression/ECT Does patient have an ACCT team?: No Does patient have Intensive In-House Services?  : No Does patient have [facility] services? : No Does patient have P4CC services?: No  ADL Screening (condition at time of admission) Patient's cognitive ability adequate to safely complete daily activities?: Yes Is the patient deaf or have difficulty hearing?: No Does the patient have  difficulty seeing, even when wearing glasses/contacts?: No Does the patient have difficulty concentrating, remembering, or making decisions?: No Patient able to express need for assistance with ADLs?: Yes Does the patient have difficulty dressing or bathing?: No Independently performs ADLs?: Yes (appropriate for developmental age) Does the patient have difficulty walking or climbing stairs?: No Weakness of Legs: None Weakness of Arms/Hands: None  Home Assistive Devices/Equipment Home Assistive Devices/Equipment: None  Therapy Consults (therapy consults require a physician order) PT Evaluation Needed: No OT Evalulation Needed: No SLP Evaluation Needed: No Abuse/Neglect Assessment (Assessment to be complete while patient is alone) Abuse/Neglect Assessment Can Be Completed: Yes Physical Abuse: Denies Verbal Abuse: Denies Sexual Abuse: Denies Exploitation of patient/patient's resources: Denies Self-Neglect: Denies Values / Beliefs Cultural Requests During Hospitalization: None Spiritual Requests During Hospitalization: None Consults Spiritual Care Consult Needed: No Social Work Consult Needed: No Regulatory affairs officer (For Healthcare) Does Patient Have a Medical Advance Directive?: No Would patient like information on creating a medical advance directive?: No - Patient declined Nutrition Screen- [facility] Adult/[facility] Has the patient recently lost weight without trying?: No Has the patient been eating poorly because of a decreased appetite?: No Malnutrition Screening  Tool Score: 0        Disposition: Per Jinny Blossom, NP, patient does not meet inpatient admission criteria.  Consult order for Social Work was entered to see if there are any other placement options for patient.  Disposition Initial Assessment Completed for this Encounter: Yes Patient referred to: (Social Work Scientific laboratory technician)  This service was provided via telemedicine using a 2-way, Chiropodist.  Names of all persons participating in this telemedicine service and their role in this encounter. Name: Dayra Rapley Role: patient  Name: Jinny Blossom Role: FNP  Name: Kasandra Knudsen Chaise Passarella Role: TTS  Name: [name] Role: patient's sister    Judeth Porch Cher Egnor 04/01/2019 3:29 PM

## 2019-04-01 NOTE — Progress Notes (Addendum)
Patient ID: Jeanette Rose, female   DOB: 1955/11/23, 63 y.o.   MRN: [id number]  Pt was seen and chart discussed with treatment team  And Dr Darleene Cleaver. Pt was released from [facility] Unit from 6-18 to 03-30-2019. She received 6 ECT treatments while she was there. She stated she can't remember anything but she in fact can remember. She was educated that some memory loss is normal with ECT and this will improve. She appears very anxious but some of this is her personality and behavior. Further inpatient psychiatric hospitalization will not benefit her at this time. She has her medications and was instructed to follow up with her psychiatrist in the community. She has an element of learned helplessness. She is calling her sister and saying she can't do anything for herself. While hospitalized at [facility] she did improve and was educated that when she returns home she will still have with taking care of her home and her medical conditions.    Collateral form sister: Jeanette Rose [phone]. Jeanette Rose stated " She called me today and said she couldn't remember anything and she was extremely anxious. She lives alone but her house is a mess, like an episode of hoarders. She can not stay with family because no one has any room for her. She needs to go somewhere and stay until she can take care of herself. They never should have let her leave." Pt's sister was educated that the Pt can take care of  herself and that some of this is behavioral. Also, educated that placement does not happen from the emergency room. Pt may not qualify for another level of care because she can care for herself. Reassured sister that we would have social work contact her with any options available but that Pt would not be placed back in the hospital as she does not meet criteria.   Pt is psychiatrically clear.  Ethelene Hal, FNP-C 04/01/2019      1552 Patient seen face-to-face for psychiatric evaluation, chart reviewed and case  discussed with the physician extender and developed treatment plan. Reviewed the information documented and agree with the treatment plan. Corena Pilgrim, MD

## 2019-04-01 NOTE — Discharge Instructions (Addendum)
Continue your current medications, follow-up with your psychiatry doctors as planned.

## 2019-04-01 NOTE — Progress Notes (Signed)
CSW spoke with patient's sister for discharge disposition. CSW notes per patient's sister she affirmed her sister needed inpatient. CSW provided psychoeducation to family and discussed our behavioral health assessment and criteria for inpatient. CSW noted patient's sister expressed concerns. CSW provided family with resources and psychoeducation to support patient appropriately in the community. CSW noted patient's sister stated she would IVC her sister. CSW notes patient's sister did not feel safe picking up sister tonight. CSW notes family barrier with discharge. CSW is signing off at this time, please re-consult for future social work needs.  Lamonte Richer, LCSW, [place] Worker II [phone]

## 2019-04-02 LAB — GLUCOSE, CAPILLARY
Glucose-Capillary: 95 mg/dL (ref 70–99)
Glucose-Capillary: 183 mg/dL — ABNORMAL HIGH (ref 70–99)

## 2019-04-26 ENCOUNTER — Encounter: Payer: BLUE CROSS/BLUE SHIELD | Admitting: Family Medicine

## 2019-05-08 ENCOUNTER — Ambulatory Visit: Payer: BLUE CROSS/BLUE SHIELD | Admitting: Internal Medicine

## 2019-05-25 MED ORDER — PHENYLEPHRINE HCL (PRESSORS) 10 MG/ML IV SOLN
INTRAVENOUS | Status: AC
  Filled 2019-05-25: qty 1

## 2019-06-22 ENCOUNTER — Encounter: Payer: BLUE CROSS/BLUE SHIELD | Admitting: Family Medicine

## 2019-06-26 MED ORDER — EPINEPHRINE PF 1 MG/ML IJ SOLN
INTRAMUSCULAR | Status: AC
  Filled 2019-06-26: qty 1

## 2019-06-26 MED ORDER — BUPIVACAINE HCL (PF) 0.25 % IJ SOLN
INTRAMUSCULAR | Status: AC
  Filled 2019-06-26: qty 30

## 2019-07-10 MED ORDER — BUPIVACAINE HCL (PF) 0.25 % IJ SOLN
INTRAMUSCULAR | Status: AC
  Filled 2019-07-10: qty 30

## 2019-07-10 MED ORDER — EPINEPHRINE PF 1 MG/ML IJ SOLN
INTRAMUSCULAR | Status: AC
  Filled 2019-07-10: qty 1

## 2019-07-13 ENCOUNTER — Encounter: Payer: Self-pay | Admitting: Family Medicine

## 2019-08-03 ENCOUNTER — Encounter: Payer: Self-pay | Admitting: Family Medicine

## 2019-08-27 ENCOUNTER — Encounter: Payer: Self-pay | Admitting: Family Medicine

## 2019-09-06 ENCOUNTER — Other Ambulatory Visit: Payer: Self-pay | Admitting: Family Medicine

## 2019-09-06 NOTE — Telephone Encounter (Signed)
Forwarding medication refill request to the clinical pool for review. 

## 2019-09-10 ENCOUNTER — Other Ambulatory Visit: Payer: Self-pay

## 2019-09-10 ENCOUNTER — Emergency Department (HOSPITAL_COMMUNITY)
Admission: EM | Admit: 2019-09-10 | Discharge: 2019-09-11 | Disposition: A | Discharge status: Other Acute Inpatient Hospital | Payer: Medicare Other | Source: Home / Self Care | Attending: Emergency Medicine | Admitting: Emergency Medicine

## 2019-09-10 ENCOUNTER — Encounter (HOSPITAL_COMMUNITY): Payer: Self-pay | Admitting: Emergency Medicine

## 2019-09-10 DIAGNOSIS — Z7982 Long term (current) use of aspirin: Secondary | ICD-10-CM | POA: Insufficient documentation

## 2019-09-10 DIAGNOSIS — E1151 Type 2 diabetes mellitus with diabetic peripheral angiopathy without gangrene: Secondary | ICD-10-CM | POA: Insufficient documentation

## 2019-09-10 DIAGNOSIS — E039 Hypothyroidism, unspecified: Secondary | ICD-10-CM | POA: Insufficient documentation

## 2019-09-10 DIAGNOSIS — Z79899 Other long term (current) drug therapy: Secondary | ICD-10-CM | POA: Insufficient documentation

## 2019-09-10 DIAGNOSIS — J45909 Unspecified asthma, uncomplicated: Secondary | ICD-10-CM | POA: Insufficient documentation

## 2019-09-10 DIAGNOSIS — Z87891 Personal history of nicotine dependence: Secondary | ICD-10-CM | POA: Insufficient documentation

## 2019-09-10 DIAGNOSIS — R45851 Suicidal ideations: Secondary | ICD-10-CM

## 2019-09-10 DIAGNOSIS — E1142 Type 2 diabetes mellitus with diabetic polyneuropathy: Secondary | ICD-10-CM | POA: Insufficient documentation

## 2019-09-10 DIAGNOSIS — Z794 Long term (current) use of insulin: Secondary | ICD-10-CM | POA: Insufficient documentation

## 2019-09-10 DIAGNOSIS — Z20828 Contact with and (suspected) exposure to other viral communicable diseases: Secondary | ICD-10-CM | POA: Insufficient documentation

## 2019-09-10 DIAGNOSIS — S50851A Superficial foreign body of right forearm, initial encounter: Secondary | ICD-10-CM | POA: Diagnosis not present

## 2019-09-10 DIAGNOSIS — I10 Essential (primary) hypertension: Secondary | ICD-10-CM | POA: Insufficient documentation

## 2019-09-10 DIAGNOSIS — F332 Major depressive disorder, recurrent severe without psychotic features: Secondary | ICD-10-CM | POA: Insufficient documentation

## 2019-09-10 DIAGNOSIS — Z03818 Encounter for observation for suspected exposure to other biological agents ruled out: Secondary | ICD-10-CM | POA: Diagnosis not present

## 2019-09-10 LAB — ACETAMINOPHEN LEVEL: Acetaminophen (Tylenol), Serum: <10 ug/mL — ABNORMAL LOW (ref 10–30)

## 2019-09-10 LAB — COMPREHENSIVE METABOLIC PANEL
ALT: 53 U/L — ABNORMAL HIGH (ref 0–44)
AST: 48 U/L — ABNORMAL HIGH (ref 15–41)
Albumin: 4.4 g/dL (ref 3.5–5.0)
Alkaline Phosphatase: 74 U/L (ref 38–126)
Anion gap: 15 (ref 5–15)
BUN: 16 mg/dL (ref 8–23)
CO2: 21 mmol/L — ABNORMAL LOW (ref 22–32)
Calcium: 9.7 mg/dL (ref 8.9–10.3)
Chloride: 99 mmol/L (ref 98–111)
Creatinine, Ser: 1.05 mg/dL — ABNORMAL HIGH (ref 0.44–1.00)
GFR calc Af Amer: >60 mL/min (ref >60)
GFR calc non Af Amer: 57 mL/min — ABNORMAL LOW (ref >60)
Glucose, Bld: 139 mg/dL — ABNORMAL HIGH (ref 70–99)
Potassium: 4.3 mmol/L (ref 3.5–5.1)
Sodium: 135 mmol/L (ref 135–145)
Total Bilirubin: 0.8 mg/dL (ref 0.3–1.2)
Total Protein: 7.9 g/dL (ref 6.5–8.1)

## 2019-09-10 LAB — URINALYSIS, ROUTINE W REFLEX MICROSCOPIC
Bilirubin Urine: NEGATIVE
Glucose, UA: NEGATIVE mg/dL
Hgb urine dipstick: NEGATIVE
Ketones, ur: NEGATIVE mg/dL
Leukocytes,Ua: NEGATIVE
Nitrite: NEGATIVE
Protein, ur: NEGATIVE mg/dL
Specific Gravity, Urine: 1.008 (ref 1.005–1.030)
pH: 5 (ref 5.0–8.0)

## 2019-09-10 LAB — CBG MONITORING, ED: Glucose-Capillary: 97 mg/dL (ref 70–99)

## 2019-09-10 LAB — CBC
HCT: 47.9 % — ABNORMAL HIGH (ref 36.0–46.0)
Hemoglobin: 15.4 g/dL — ABNORMAL HIGH (ref 12.0–15.0)
MCH: 29.1 pg (ref 26.0–34.0)
MCHC: 32.2 g/dL (ref 30.0–36.0)
MCV: 90.5 fL (ref 80.0–100.0)
Platelets: 331 10*3/uL (ref 150–400)
RBC: 5.29 MIL/uL — ABNORMAL HIGH (ref 3.87–5.11)
RDW: 14.1 % (ref 11.5–15.5)
WBC: 12 10*3/uL — ABNORMAL HIGH (ref 4.0–10.5)
nRBC: 0 % (ref 0.0–0.2)

## 2019-09-10 LAB — RAPID URINE DRUG SCREEN, HOSP PERFORMED
Amphetamines: NOT DETECTED
Barbiturates: NOT DETECTED
Benzodiazepines: POSITIVE — AB
Cocaine: NOT DETECTED
Opiates: NOT DETECTED
Tetrahydrocannabinol: NOT DETECTED

## 2019-09-10 LAB — ETHANOL: Alcohol, Ethyl (B): <10 mg/dL (ref <10)

## 2019-09-10 LAB — SALICYLATE LEVEL: Salicylate Lvl: <7 mg/dL (ref 2.8–30.0)

## 2019-09-10 LAB — RESPIRATORY PANEL BY RT PCR (FLU A&B, COVID)
Influenza A by PCR: NEGATIVE
Influenza B by PCR: NEGATIVE
SARS Coronavirus 2 by RT PCR: NEGATIVE

## 2019-09-10 LAB — LITHIUM LEVEL: Lithium Lvl: 0.1 mmol/L — ABNORMAL LOW (ref 0.60–1.20)

## 2019-09-10 MED ORDER — ESCITALOPRAM OXALATE 10 MG PO TABS: 10.0000 mg | ORAL_TABLET | Freq: Every day | ORAL | Status: DC

## 2019-09-10 MED ORDER — DULOXETINE HCL 20 MG PO CPEP: 20.0000 mg | ORAL_CAPSULE | Freq: Every day | ORAL | Status: DC

## 2019-09-10 MED ORDER — METFORMIN HCL ER 500 MG PO TB24
1000.0000 mg | ORAL_TABLET | Freq: Every day | ORAL | Status: DC
  Administered 2019-09-11: 1000 mg via ORAL
  Filled 2019-09-10 (×2): qty 2

## 2019-09-10 MED ORDER — IRBESARTAN 150 MG PO TABS
150.0000 mg | ORAL_TABLET | Freq: Every day | ORAL | Status: DC
  Administered 2019-09-11: 150 mg via ORAL
  Filled 2019-09-10: qty 1

## 2019-09-10 MED ORDER — ASPIRIN 81 MG PO TBEC
81.0000 mg | DELAYED_RELEASE_TABLET | Freq: Every day | ORAL | Status: DC
  Administered 2019-09-11: 81 mg via ORAL
  Filled 2019-09-10 (×2): qty 1

## 2019-09-10 MED ORDER — DOCUSATE SODIUM 100 MG PO CAPS
100.0000 mg | ORAL_CAPSULE | Freq: Two times a day (BID) | ORAL | Status: DC
  Administered 2019-09-10 – 2019-09-11 (×3): 100 mg via ORAL
  Filled 2019-09-10 (×3): qty 1

## 2019-09-10 MED ORDER — DULOXETINE HCL 30 MG PO CPEP
60.0000 mg | ORAL_CAPSULE | Freq: Every day | ORAL | Status: DC
  Administered 2019-09-11: 60 mg via ORAL
  Filled 2019-09-10: qty 2

## 2019-09-10 MED ORDER — PANTOPRAZOLE SODIUM 40 MG PO TBEC
40.0000 mg | DELAYED_RELEASE_TABLET | ORAL | Status: DC
  Administered 2019-09-11: 40 mg via ORAL
  Filled 2019-09-10: qty 1

## 2019-09-10 MED ORDER — BUSPIRONE HCL 10 MG PO TABS
15.0000 mg | ORAL_TABLET | Freq: Three times a day (TID) | ORAL | Status: DC
  Filled 2019-09-10: qty 2

## 2019-09-10 MED ORDER — HYDROXYZINE HCL 25 MG PO TABS: 50.0000 mg | ORAL_TABLET | Freq: Four times a day (QID) | ORAL | Status: DC | PRN

## 2019-09-10 MED ORDER — ATORVASTATIN CALCIUM 80 MG PO TABS
80.0000 mg | ORAL_TABLET | Freq: Every day | ORAL | Status: DC
  Administered 2019-09-10: 80 mg via ORAL
  Filled 2019-09-10 (×3): qty 1

## 2019-09-10 MED ORDER — LORAZEPAM 1 MG PO TABS
1.0000 mg | ORAL_TABLET | Freq: Two times a day (BID) | ORAL | Status: DC
  Administered 2019-09-10 – 2019-09-11 (×3): 1 mg via ORAL
  Filled 2019-09-10 (×4): qty 1

## 2019-09-10 MED ORDER — INSULIN GLARGINE 100 UNIT/ML ~~LOC~~ SOLN
72.0000 [IU] | Freq: Every morning | SUBCUTANEOUS | Status: DC
  Administered 2019-09-11: 72 [IU] via SUBCUTANEOUS
  Filled 2019-09-10: qty 0.72

## 2019-09-10 NOTE — ED Notes (Signed)
Provided patient a phone and her sister's number to call.

## 2019-09-10 NOTE — ED Triage Notes (Addendum)
Per PTAR pt recent medication changes for her depression. Put on lithium "but it's not working so valium was added." Since pt verbalizing increased SI "I just want to take the whole bottle of pills." Pt denies SI with plan or acting out self harm. With triage pt verbalizes "I just had a thought but I'd never do anything."

## 2019-09-10 NOTE — ED Notes (Signed)
Provided patient a phone to call her sister again.

## 2019-09-10 NOTE — BH Assessment (Addendum)
Assessment Note  Jeanette Rose is an 63 y.o. female that presents this date with S/I. Patient does not voice a immediate plan although has difficultly verbalizing symptoms and concerns. Patient states "it seems all I think about is darkness and the end." Patient is observed to be very distraught and has trouble rendering her history. Patient's memory is recent impaired and she seems very disorganized as she attempts to render her history. Patient denies any H/I or AVH. Patient has a history of multiple medical issues, including depression and anxiety. Patient denies any previous attempts or gestures at self harm although per notes was last seen on 04/01/19 when she presented with similar symptoms. Patient states "she just can't think straight anymore" and is unsure what is causing her symptoms but does have a history of same. Also admits to ongoing severe depression. Patient currently receives medication management from [facility] MD and reports current compliance. Patient also has had ECT therapy in the past. Patient states her symptoms of depression have worsened in the last month to include: feeling overwhelmed and isolating. Patient reports her main stressor is associated with her moving to another apartment that was suppose to happen this week. Patient states she "just has no motivation" and reports  she feels suicidal at this time without an actual plan.  Patient is oriented to place and person and is displaying some active thought blocking. Patient reports some occasional memory lapses and denies any history of head injury. States that her memory lapses involve her short as well as long-term memory. No recent use of alcohol or illicit drugs. She states that she takes all her medications daily. She notes that she has recently had changes in her medications within the last week including lithium, Ativan, Valium, although it is unclear which of these have been changed, and to which extent. Patient presented as anxious  and disorganized. Patient keeps repeating she cannot remember anything and is tearful throughout the assessment. Patient did not appear to be responding to internal stimuli. Patient's judgment, insight and impulse control appear to be impaired. Case was staffed with Rankin NP who recommended patient be observed and monitored.     Diagnosis: F33.2 MDD recurrent without psychotic symptoms, severe GAD  Past Medical History:  Past Medical History:  Diagnosis Date  . Acute acalculous cholecystitis s/p lap chole 04/03/2014 04/02/2014  . Allergy   . Anemia   . Anxiety   . Asthma   . Depression   . Fatty liver    noted on CT per [facility] records  . Fibromyalgia   . GERD (gastroesophageal reflux disease)   . Hyperlipidemia associated with type 2 diabetes mellitus ([place])   . Hypertension   . Hypothyroid 2015   Transiently in 2015. Synthroid 21mg was stopped 05/07/2016 w/ tsh nml following.  . Obesity (BMI 30-39.9)   . OSA (obstructive sleep apnea)    non-compliant with CPAP  . Seborrheic dermatitis    [facility]  . Stroke ([facility]    TIA  . [place] 2 diabetes mellitus [place]     Past Surgical History:  Procedure Laterality Date  . BREAST REDUCTION SURGERY  06/2008  . CHOLECYSTECTOMY N/A 04/03/2014   Procedure: LAPAROSCOPIC CHOLECYSTECTOMY ;  Surgeon: SAdin Hector MD;  Location: [facility];  Service: General;  Laterality: N/A;  . REDUCTION MAMMAPLASTY    . TUBAL LIGATION      Family History:  Family History  Problem Relation Age of Onset  . Atrial  fibrillation Mother   . Heart disease Father        CAD  . Diabetes Brother   . Cancer Maternal Grandmother   . Diabetes Paternal Grandmother   . Fibromyalgia Sister   . Colitis Sister     Social History:  reports that she quit smoking about 15 years ago. Her smoking use included cigarettes. She has a 60.00 pack-year smoking history. She has never used smokeless tobacco. She reports that she does not drink  alcohol or use drugs.  Additional Social History:  Alcohol / Drug Use Pain Medications: See MAR Prescriptions: See MAR Over the Counter: See MAR History of alcohol / drug use?: No history of alcohol / drug abuse  CIWA: CIWA-Ar BP: 130/71 Pulse Rate: (!) 127 COWS:    Allergies:  Allergies  Allergen Reactions  . Farxiga [Dapagliflozin] Anxiety and Other (See Comments)    Made depression and anxiety worse  . Rexulti [Brexpiprazole] Anxiety and Other (See Comments)    Made anxiety worse  . Trintellix [Vortioxetine] Anxiety and Other (See Comments)    Made anxiety worse    Home Medications: (Not in a hospital admission)   OB/GYN Status:  No LMP recorded. Patient is postmenopausal.  General Assessment Data [facility]: [facility] TTS Assessment: In system Is this a Tele or Face-to-Face Assessment?: Face-to-Face Is this an Initial Assessment or a Re-assessment for this encounter?: Initial Assessment Patient Accompanied by:: Other Language Other than English: No Living Arrangements: Other (Comment) What gender do you identify as?: Female Marital status: Single Living Arrangements: Alone Can pt return to current living arrangement?: Yes Admission Status: Voluntary Is patient capable of signing voluntary admission?: Yes Referral Source: Self/Family/Friend Insurance type: [place] Screening Exam ([place]) Medical Exam completed: Yes  Crisis Care Plan Living Arrangements: Alone Legal Guardian: (NA) Name of Psychiatrist: [facility] MD Name of Therapist: None  Education Status Is patient currently in school?: No Is the patient employed, unemployed or receiving disability?: Receiving disability income  Risk to self with the past 6 months Suicidal Ideation: Yes-Currently Present Has patient been a risk to self within the past 6 months prior to admission? : Yes Suicidal Intent: Yes-Currently Present Has patient had any suicidal intent within  the past 6 months prior to admission? : No Is patient at risk for suicide?: Yes Suicidal Plan?: No Has patient had any suicidal plan within the past 6 months prior to admission? : No Access to Means: No What has been your use of drugs/alcohol within the last 12 months?: Denies Previous Attempts/Gestures: No How many times?: 0 Other Self Harm Risks: (NA) Triggers for Past Attempts: (NA) Intentional Self Injurious Behavior: None Family Suicide History: No Recent stressful life event(s): Other (Comment)(Pt getting ready to relocate) Persecutory voices/beliefs?: No Depression: Yes Depression Symptoms: Guilt, Feeling worthless/self pity Substance abuse history and/or treatment for substance abuse?: No Suicide prevention information given to non-admitted patients: Not applicable  Risk to Others within the past 6 months Homicidal Ideation: No Does patient have any lifetime risk of violence toward others beyond the six months prior to admission? : No Thoughts of Harm to Others: No Current Homicidal Intent: No Current Homicidal Plan: No Access to Homicidal Means: No Identified Victim: NA History of harm to others?: No Assessment of Violence: None Noted Violent Behavior Description: NA Does patient have access to weapons?: No Criminal Charges Pending?: No Does patient have a court date: No Is patient on probation?: No  Psychosis Hallucinations: None  noted Delusions: None noted  Mental Status Report Appearance/Hygiene: In scrubs Eye Contact: Fair Motor Activity: Freedom of movement Speech: Soft, Slow Level of Consciousness: Quiet/awake Mood: Depressed Affect: Anxious Anxiety Level: Moderate Thought Processes: Thought Blocking Judgement: Partial Orientation: Person, Place Obsessive Compulsive Thoughts/Behaviors: None  Cognitive Functioning Concentration: Decreased Memory: Recent Impaired Is patient IDD: No Insight: Poor Impulse Control: Poor Appetite: Fair Have you had  any weight changes? : No Change Sleep: No Change Total Hours of Sleep: 7 Vegetative Symptoms: None  ADLScreening [facility] Assessment Services) Patient's cognitive ability adequate to safely complete daily activities?: Yes Patient able to express need for assistance with ADLs?: Yes Independently performs ADLs?: Yes (appropriate for developmental age)  Prior Inpatient Therapy Prior Inpatient Therapy: Yes Prior Therapy Dates: 2020 Prior Therapy Facilty/Provider(s): [facility] Reason for Treatment: MH issues  Prior Outpatient Therapy Prior Outpatient Therapy: Yes Prior Therapy Dates: Ongoing Prior Therapy Facilty/Provider(s): [facility] MD Reason for Treatment: Med mang Does patient have an ACCT team?: No Does patient have Intensive In-House Services?  : No Does patient have [facility] services? : No Does patient have P4CC services?: No  ADL Screening (condition at time of admission) Patient's cognitive ability adequate to safely complete daily activities?: Yes Is the patient deaf or have difficulty hearing?: No Does the patient have difficulty seeing, even when wearing glasses/contacts?: No Does the patient have difficulty concentrating, remembering, or making decisions?: No Patient able to express need for assistance with ADLs?: Yes Does the patient have difficulty dressing or bathing?: No Independently performs ADLs?: Yes (appropriate for developmental age) Does the patient have difficulty walking or climbing stairs?: No Weakness of Legs: None Weakness of Arms/Hands: None  Home Assistive Devices/Equipment Home Assistive Devices/Equipment: None  Therapy Consults (therapy consults require a physician order) PT Evaluation Needed: No OT Evalulation Needed: No SLP Evaluation Needed: No Abuse/Neglect Assessment (Assessment to be complete while patient is alone) Abuse/Neglect Assessment Can Be Completed: Yes Physical Abuse: Denies Verbal Abuse: Denies Sexual Abuse: Denies Exploitation of  patient/patient's resources: Denies Self-Neglect: Denies Values / Beliefs Cultural Requests During Hospitalization: None Spiritual Requests During Hospitalization: None Consults Spiritual Care Consult Needed: No Transition of Care Team Consult Needed: No Advance Directives (For Healthcare) Does Patient Have a Medical Advance Directive?: No Would patient like information on creating a medical advance directive?: No - Patient declined          Disposition: Case was staffed with Rankin NP who recommended patient be observed and monitored.    Disposition Initial Assessment Completed for this Encounter: Yes Disposition of Patient: (Observe and monitor)  On Site Evaluation by:   Reviewed with Physician:    Mamie Nick 09/10/2019 4:41 PM

## 2019-09-10 NOTE — ED Notes (Signed)
TTS at bedside. 

## 2019-09-10 NOTE — ED Provider Notes (Signed)
[facility] DEPT Provider Note   CSN: [id number] Arrival date & time: 09/10/19  1230     History Chief Complaint  Patient presents with  . Suicidal    Jeanette Rose is a 63 y.o. female.  HPI     Patient with history of multiple medical issues, including psychiatric disease, depression, presents after an episode of suicidal ideation. Patient states that she had a brief period of suicidal ideation just prior to ED arrival. She denies physical pain, but does have some concern for bilateral distal upper extremity shakiness, and soreness in her shoulders. She acknowledges moving heavy objects recently, denies fall, trauma. She states that she takes all her medication including antipsychotics regularly.  She has been hospitalized in behavioral health facilities within the past year, has received ECT as well. She notes that she has recently had changes in her medications including lithium, Ativan, Valium, though it is unclear which of these have been changed, and to which extent.   Past Medical History:  Diagnosis Date  . Acute acalculous cholecystitis s/p lap chole 04/03/2014 04/02/2014  . Allergy   . Anemia   . Anxiety   . Asthma   . Depression   . Fatty liver    noted on CT per [facility] records  . Fibromyalgia   . GERD (gastroesophageal reflux disease)   . Hyperlipidemia associated with type 2 diabetes mellitus ([place])   . Hypertension   . Hypothyroid 2015   Transiently in 2015. Synthroid 77mg was stopped 05/07/2016 w/ tsh nml following.  . Obesity (BMI 30-39.9)   . OSA (obstructive sleep apnea)    non-compliant with CPAP  . Seborrheic dermatitis    [facility]  . Stroke ([facility]    TIA  . Type 2 diabetes mellitus ([facility]     Patient Active Problem List   Diagnosis Date Noted  . Major depression 03/15/2019  . Severe recurrent major depression without psychotic features ([place] 03/15/2019  . GAD (generalized  anxiety disorder) 03/04/2019  . Benzodiazepine dependence, continuous ([place] 03/04/2019  . MDD (major depressive disorder), recurrent episode, severe ([place] 03/04/2019  . Positive colorectal cancer screening using Cologuard test 02/15/2018  . Seborrheic dermatitis   . History of hypothyroidism 10/26/2017  . Elevated ferritin level 10/26/2017  . Vitamin D deficiency 10/26/2017  . Diabetic peripheral neuropathy associated with type 2 diabetes mellitus ([place] 10/26/2017  . OSA (obstructive sleep apnea)   . Hyperlipidemia LDL goal <100   . Major depressive disorder, recurrent episode ([facility]   . Constipation 04/22/2014  . Steatohepatitis 04/03/2014  . Anxiety   . Type 2 diabetes mellitus with diabetic polyneuropathy, with long-term current use of insulin ([place]   . GERD (gastroesophageal reflux disease)   . Hypertension   . Obesity (BMI 30-39.9)     Past Surgical History:  Procedure Laterality Date  . BREAST REDUCTION SURGERY  06/2008  . CHOLECYSTECTOMY N/A 04/03/2014   Procedure: LAPAROSCOPIC CHOLECYSTECTOMY ;  Surgeon: SAdin Hector MD;  [facility];  Service: General;  Laterality: N/A;  . REDUCTION MAMMAPLASTY    . TUBAL LIGATION       OB History   No obstetric history on file.     Family History  Problem Relation Age of Onset  . Atrial fibrillation Mother   . Heart disease Father        CAD  . Diabetes Brother   . Cancer Maternal Grandmother   . Diabetes Paternal Grandmother   .  Fibromyalgia Sister   . Colitis Sister     Social History   Tobacco Use  . Smoking status: Former Smoker    Packs/day: 4.00    Years: 15.00    Pack years: 60.00    Types: Cigarettes    Quit date: 04/22/2004    Years since quitting: 15.3  . Smokeless tobacco: Never Used  . Tobacco comment: 4 pack year hx  Substance Use Topics  . Alcohol use: No  . Drug use: No    Home Medications Prior to Admission medications   Medication Sig Start Date End Date Taking? Authorizing Provider   amoxicillin (AMOXIL) 500 MG capsule Take 1 capsule (500 mg total) by mouth every 12 (twelve) hours. 03/29/19   Clapacs, Madie Reno, MD  aspirin 81 MG EC tablet Take 1 tablet (81 mg total) by mouth daily. 03/30/19   Clapacs, Madie Reno, MD  atorvastatin (LIPITOR) 80 MG tablet Take 1 tablet (80 mg total) by mouth daily at 6 PM. 03/30/19   Clapacs, Madie Reno, MD  busPIRone (BUSPAR) 15 MG tablet Take 1 tablet (15 mg total) by mouth 3 (three) times daily. 03/30/19   Clapacs, Madie Reno, MD  docusate sodium (COLACE) 100 MG capsule Take 1 capsule (100 mg total) by mouth 2 (two) times daily. 03/30/19   Clapacs, Madie Reno, MD  DULoxetine (CYMBALTA) 20 MG capsule Take 1 capsule (20 mg total) by mouth daily. 03/30/19   Clapacs, Madie Reno, MD  escitalopram (LEXAPRO) 10 MG tablet Take 1 tablet (10 mg total) by mouth daily. 03/30/19   Clapacs, Madie Reno, MD  hydrOXYzine (ATARAX/VISTARIL) 50 MG tablet Take 1 tablet (50 mg total) by mouth every 6 (six) hours as needed for anxiety. 03/30/19   Clapacs, Madie Reno, MD  Insulin Glargine, 2 Unit Dial, (TOUJEO MAX SOLOSTAR) 300 UNIT/ML SOPN Inject 72 Units into the skin every morning.    [provider]  irbesartan (AVAPRO) 150 MG tablet Take 1 tablet (150 mg total) by mouth daily. 03/30/19   Clapacs, Madie Reno, MD  LORazepam (ATIVAN) 1 MG tablet Take 1 tablet (1 mg total) by mouth 2 (two) times daily. 03/30/19   Clapacs, Madie Reno, MD  metFORMIN (GLUCOPHAGE-XR) 500 MG 24 hr tablet Take 1,000 mg by mouth 2 (two) times a day. 03/19/19   [provider]  pantoprazole (PROTONIX) 40 MG tablet Take 1 tablet (40 mg total) by mouth every morning. 03/30/19   Clapacs, Madie Reno, MD    Allergies    Farxiga [dapagliflozin], Rexulti [brexpiprazole], and Trintellix [vortioxetine]  Review of Systems   Review of Systems  Constitutional:       Per HPI, otherwise negative  HENT:       Per HPI, otherwise negative  Respiratory:       Per HPI, otherwise negative  Cardiovascular:       Per HPI, otherwise negative   Gastrointestinal: Negative for vomiting.  Endocrine:       Negative aside from HPI  Genitourinary:       Neg aside from HPI   Musculoskeletal:       Per HPI, otherwise negative  Skin: Negative.   Neurological: Negative for syncope.  Psychiatric/Behavioral: Positive for dysphoric mood and suicidal ideas. The patient is nervous/anxious.     Physical Exam Updated Vital Signs BP 130/71   Pulse (!) 127   Temp 97.8 F (36.6 C) (Oral)   Resp 18   Ht 5' 4"  (1.626 m)   Wt 81.6 kg  SpO2 100%   BMI 30.90 kg/m   Physical Exam Vitals and nursing note reviewed.  Constitutional:      General: She is not in acute distress.    Appearance: She is well-developed.  HENT:     Head: Normocephalic and atraumatic.  Eyes:     Conjunctiva/sclera: Conjunctivae normal.  Pulmonary:     Effort: Pulmonary effort is normal. No respiratory distress.     Breath sounds: No stridor.  Abdominal:     General: There is no distension.  Skin:    General: Skin is warm and dry.  Neurological:     Mental Status: She is alert and oriented to person, place, and time.     Cranial Nerves: No cranial nerve deficit.     Comments: No seizure activity, tremor, no substantial atrophy, patient moves all extremities spontaneously.  Psychiatric:        Speech: Speech is delayed.        Behavior: Behavior is withdrawn.     ED Results / Procedures / Treatments   Labs (all labs ordered are listed, but only abnormal results are displayed) Labs Reviewed  COMPREHENSIVE METABOLIC PANEL - Abnormal; Notable for the following components:      Result Value   CO2 21 (*)    Glucose, Bld 139 (*)    Creatinine, Ser 1.05 (*)    AST 48 (*)    ALT 53 (*)    GFR calc non Af Amer 57 (*)    All other components within normal limits  CBC - Abnormal; Notable for the following components:   WBC 12.0 (*)    RBC 5.29 (*)    Hemoglobin 15.4 (*)    HCT 47.9 (*)    All other components within normal limits  RAPID URINE DRUG  SCREEN, HOSP PERFORMED - Abnormal; Notable for the following components:   Benzodiazepines POSITIVE (*)    All other components within normal limits  LITHIUM LEVEL - Abnormal; Notable for the following components:   Lithium Lvl 0.10 (*)    All other components within normal limits  ETHANOL  SALICYLATE LEVEL  ACETAMINOPHEN LEVEL    Procedures Procedures (including critical care time)  Medications Ordered in ED Medications - No data to display  ED Course  I have reviewed the triage vital signs and the nursing notes.  Pertinent labs & imaging results that were available during my care of the patient were reviewed by me and considered in my medical decision making (see chart for details).    MDM Rules/Calculators/A&P                     Initial labs notable for subtherapeutic lithium level, but the patient may have stopped this medication.  Patient was found to have benzodiazepine positive drug screen, consistent with her use of this medication.   This adult female with a history of depression presents after an episode of suicidal ideation.  On she is awake, alert, in no distress, initial labs notable for subtherapeutic lithium level, otherwise largely noncontributory. Patient medically cleared for psychiatric evaluation. Final Clinical Impression(s) / ED Diagnoses Final diagnoses:  Suicidal ideation    Rx / DC Orders ED Discharge Orders    None       Carmin Muskrat, MD 09/10/19 1440

## 2019-09-10 NOTE — BH Assessment (Signed)
[place] Assessment Progress Note  Case was staffed with Rankin NP who recommended patient be observed and monitored.

## 2019-09-10 NOTE — ED Notes (Addendum)
Patients sister, Helene Kelp, would like patient to have a social work consult tomorrow if she is discharged. She is concerned about her living by herself.

## 2019-09-11 ENCOUNTER — Encounter (HOSPITAL_COMMUNITY): Payer: Self-pay | Admitting: Registered Nurse

## 2019-09-11 ENCOUNTER — Inpatient Hospital Stay (HOSPITAL_COMMUNITY)
Admission: AD | Admit: 2019-09-11 | Discharge: 2019-09-27 | DRG: 885 | Disposition: A | Discharge status: Home / Self Care | Payer: Medicare Other | Source: Intra-hospital | Attending: Emergency Medicine | Admitting: Emergency Medicine

## 2019-09-11 ENCOUNTER — Encounter: Payer: Self-pay | Admitting: Family Medicine

## 2019-09-11 DIAGNOSIS — R45851 Suicidal ideations: Secondary | ICD-10-CM | POA: Diagnosis present

## 2019-09-11 DIAGNOSIS — G4700 Insomnia, unspecified: Secondary | ICD-10-CM | POA: Diagnosis present

## 2019-09-11 DIAGNOSIS — Z7982 Long term (current) use of aspirin: Secondary | ICD-10-CM

## 2019-09-11 DIAGNOSIS — Z888 Allergy status to other drugs, medicaments and biological substances status: Secondary | ICD-10-CM | POA: Diagnosis not present

## 2019-09-11 DIAGNOSIS — F603 Borderline personality disorder: Secondary | ICD-10-CM | POA: Diagnosis present

## 2019-09-11 DIAGNOSIS — K5900 Constipation, unspecified: Secondary | ICD-10-CM | POA: Diagnosis present

## 2019-09-11 DIAGNOSIS — F1320 Sedative, hypnotic or anxiolytic dependence, uncomplicated: Secondary | ICD-10-CM | POA: Diagnosis present

## 2019-09-11 DIAGNOSIS — W010XXA Fall on same level from slipping, tripping and stumbling without subsequent striking against object, initial encounter: Secondary | ICD-10-CM | POA: Diagnosis not present

## 2019-09-11 DIAGNOSIS — Z8673 Personal history of transient ischemic attack (TIA), and cerebral infarction without residual deficits: Secondary | ICD-10-CM

## 2019-09-11 DIAGNOSIS — E039 Hypothyroidism, unspecified: Secondary | ICD-10-CM | POA: Diagnosis present

## 2019-09-11 DIAGNOSIS — E1142 Type 2 diabetes mellitus with diabetic polyneuropathy: Secondary | ICD-10-CM

## 2019-09-11 DIAGNOSIS — G4733 Obstructive sleep apnea (adult) (pediatric): Secondary | ICD-10-CM | POA: Diagnosis present

## 2019-09-11 DIAGNOSIS — R05 Cough: Secondary | ICD-10-CM | POA: Diagnosis not present

## 2019-09-11 DIAGNOSIS — Z9119 Patient's noncompliance with other medical treatment and regimen: Secondary | ICD-10-CM

## 2019-09-11 DIAGNOSIS — S50851A Superficial foreign body of right forearm, initial encounter: Secondary | ICD-10-CM | POA: Diagnosis not present

## 2019-09-11 DIAGNOSIS — F341 Dysthymic disorder: Secondary | ICD-10-CM | POA: Diagnosis present

## 2019-09-11 DIAGNOSIS — K219 Gastro-esophageal reflux disease without esophagitis: Secondary | ICD-10-CM | POA: Diagnosis present

## 2019-09-11 DIAGNOSIS — F411 Generalized anxiety disorder: Secondary | ICD-10-CM

## 2019-09-11 DIAGNOSIS — E785 Hyperlipidemia, unspecified: Secondary | ICD-10-CM | POA: Diagnosis present

## 2019-09-11 DIAGNOSIS — Z833 Family history of diabetes mellitus: Secondary | ICD-10-CM

## 2019-09-11 DIAGNOSIS — R059 Cough, unspecified: Secondary | ICD-10-CM

## 2019-09-11 DIAGNOSIS — Z87891 Personal history of nicotine dependence: Secondary | ICD-10-CM | POA: Diagnosis not present

## 2019-09-11 DIAGNOSIS — Z8249 Family history of ischemic heart disease and other diseases of the circulatory system: Secondary | ICD-10-CM

## 2019-09-11 DIAGNOSIS — Z794 Long term (current) use of insulin: Secondary | ICD-10-CM | POA: Diagnosis not present

## 2019-09-11 DIAGNOSIS — Z20828 Contact with and (suspected) exposure to other viral communicable diseases: Secondary | ICD-10-CM | POA: Diagnosis present

## 2019-09-11 DIAGNOSIS — I10 Essential (primary) hypertension: Secondary | ICD-10-CM | POA: Diagnosis present

## 2019-09-11 DIAGNOSIS — R531 Weakness: Secondary | ICD-10-CM | POA: Diagnosis present

## 2019-09-11 DIAGNOSIS — F4310 Post-traumatic stress disorder, unspecified: Secondary | ICD-10-CM | POA: Diagnosis present

## 2019-09-11 DIAGNOSIS — Z79899 Other long term (current) drug therapy: Secondary | ICD-10-CM

## 2019-09-11 DIAGNOSIS — F332 Major depressive disorder, recurrent severe without psychotic features: Secondary | ICD-10-CM | POA: Diagnosis present

## 2019-09-11 LAB — CBG MONITORING, ED: Glucose-Capillary: 97 mg/dL (ref 70–99)

## 2019-09-11 LAB — GLUCOSE, CAPILLARY: Glucose-Capillary: 105 mg/dL — ABNORMAL HIGH (ref 70–99)

## 2019-09-11 MED ORDER — IRBESARTAN 150 MG PO TABS
150.0000 mg | ORAL_TABLET | Freq: Every day | ORAL | Status: DC
  Administered 2019-09-12 – 2019-09-17 (×6): 150 mg via ORAL
  Filled 2019-09-11 (×6): qty 1
  Filled 2019-09-11: qty 2
  Filled 2019-09-11 (×2): qty 1

## 2019-09-11 MED ORDER — MICONAZOLE NITRATE 2 % VA CREA
1.0000 | TOPICAL_CREAM | Freq: Every day | VAGINAL | Status: DC
  Administered 2019-09-11: 1 via VAGINAL
  Filled 2019-09-11: qty 45

## 2019-09-11 MED ORDER — FLUCONAZOLE 200 MG PO TABS
200.0000 mg | ORAL_TABLET | Freq: Once | ORAL | Status: AC
  Administered 2019-09-11: 200 mg via ORAL
  Filled 2019-09-11: qty 1

## 2019-09-11 MED ORDER — MICONAZOLE NITRATE 2 % VA CREA
1.0000 | TOPICAL_CREAM | Freq: Every day | VAGINAL | Status: DC
  Filled 2019-09-11: qty 45

## 2019-09-11 MED ORDER — INSULIN ASPART 100 UNIT/ML ~~LOC~~ SOLN
0.0000 [IU] | Freq: Three times a day (TID) | SUBCUTANEOUS | Status: DC
  Administered 2019-09-13: 1 [IU] via SUBCUTANEOUS
  Administered 2019-09-14: 17:00:00 2 [IU] via SUBCUTANEOUS
  Administered 2019-09-15: 17:00:00 1 [IU] via SUBCUTANEOUS
  Administered 2019-09-15: 2 [IU] via SUBCUTANEOUS
  Administered 2019-09-16 (×3): 1 [IU] via SUBCUTANEOUS
  Administered 2019-09-17: 12:00:00 2 [IU] via SUBCUTANEOUS
  Administered 2019-09-18 (×2): 1 [IU] via SUBCUTANEOUS
  Administered 2019-09-19 – 2019-09-20 (×2): 2 [IU] via SUBCUTANEOUS
  Administered 2019-09-20: 12:00:00 1 [IU] via SUBCUTANEOUS
  Administered 2019-09-21: 3 [IU] via SUBCUTANEOUS
  Administered 2019-09-21: 2 [IU] via SUBCUTANEOUS
  Administered 2019-09-21 – 2019-09-22 (×2): 1 [IU] via SUBCUTANEOUS
  Administered 2019-09-22: 2 [IU] via SUBCUTANEOUS
  Administered 2019-09-23: 1 [IU] via SUBCUTANEOUS
  Administered 2019-09-23: 12:00:00 2 [IU] via SUBCUTANEOUS
  Administered 2019-09-23: 1 [IU] via SUBCUTANEOUS
  Administered 2019-09-24 (×2): 2 [IU] via SUBCUTANEOUS
  Administered 2019-09-25 (×2): 1 [IU] via SUBCUTANEOUS
  Administered 2019-09-25: 2 [IU] via SUBCUTANEOUS
  Administered 2019-09-26: 1 [IU] via SUBCUTANEOUS
  Administered 2019-09-26: 2 [IU] via SUBCUTANEOUS
  Administered 2019-09-26: 1 [IU] via SUBCUTANEOUS
  Administered 2019-09-27 (×2): 2 [IU] via SUBCUTANEOUS
  Filled 2019-09-11: qty 0.09

## 2019-09-11 MED ORDER — ASPIRIN 81 MG PO TBEC
81.0000 mg | DELAYED_RELEASE_TABLET | Freq: Every day | ORAL | Status: DC
  Administered 2019-09-12 – 2019-09-27 (×16): 81 mg via ORAL
  Filled 2019-09-11 (×20): qty 1

## 2019-09-11 MED ORDER — MICONAZOLE NITRATE 2 % EX CREA
TOPICAL_CREAM | Freq: Two times a day (BID) | CUTANEOUS | Status: DC
  Administered 2019-09-12 (×2): 1 via TOPICAL
  Filled 2019-09-11: qty 14

## 2019-09-11 MED ORDER — METFORMIN HCL ER 500 MG PO TB24
1000.0000 mg | ORAL_TABLET | Freq: Every day | ORAL | Status: DC
  Administered 2019-09-12 – 2019-09-27 (×15): 1000 mg via ORAL
  Filled 2019-09-11 (×19): qty 2

## 2019-09-11 MED ORDER — INSULIN GLARGINE 100 UNIT/ML ~~LOC~~ SOLN
72.0000 [IU] | Freq: Every morning | SUBCUTANEOUS | Status: DC
  Administered 2019-09-12 – 2019-09-13 (×2): 72 [IU] via SUBCUTANEOUS

## 2019-09-11 MED ORDER — ATORVASTATIN CALCIUM 80 MG PO TABS
80.0000 mg | ORAL_TABLET | Freq: Every day | ORAL | Status: DC
  Administered 2019-09-12 – 2019-09-26 (×15): 80 mg via ORAL
  Filled 2019-09-11 (×18): qty 1

## 2019-09-11 MED ORDER — DULOXETINE HCL 60 MG PO CPEP
60.0000 mg | ORAL_CAPSULE | Freq: Every day | ORAL | Status: DC
  Administered 2019-09-12: 08:00:00 60 mg via ORAL
  Filled 2019-09-11 (×3): qty 1

## 2019-09-11 MED ORDER — MICONAZOLE NITRATE 2 % EX CREA
TOPICAL_CREAM | Freq: Two times a day (BID) | CUTANEOUS | Status: DC
  Filled 2019-09-11: qty 28

## 2019-09-11 MED ORDER — MICONAZOLE NITRATE 100 MG VA SUPP
100.0000 mg | Freq: Every day | VAGINAL | Status: DC
  Filled 2019-09-11: qty 7

## 2019-09-11 MED ORDER — PANTOPRAZOLE SODIUM 40 MG PO TBEC
40.0000 mg | DELAYED_RELEASE_TABLET | ORAL | Status: DC
  Administered 2019-09-12 – 2019-09-17 (×6): 40 mg via ORAL
  Filled 2019-09-11 (×9): qty 1

## 2019-09-11 MED ORDER — LORAZEPAM 1 MG PO TABS
1.0000 mg | ORAL_TABLET | Freq: Two times a day (BID) | ORAL | Status: DC
  Administered 2019-09-11 – 2019-09-12 (×2): 1 mg via ORAL
  Filled 2019-09-11 (×2): qty 1

## 2019-09-11 MED ORDER — DOCUSATE SODIUM 100 MG PO CAPS
100.0000 mg | ORAL_CAPSULE | Freq: Two times a day (BID) | ORAL | Status: DC
  Administered 2019-09-11 – 2019-09-27 (×32): 100 mg via ORAL
  Filled 2019-09-11 (×41): qty 1

## 2019-09-11 MED ORDER — MICONAZOLE NITRATE 2 % VA CREA: 1.0000 | TOPICAL_CREAM | Freq: Every day | VAGINAL | Status: DC

## 2019-09-11 NOTE — ED Provider Notes (Signed)
Patient complaining of vaginal itching and burning.  On exam the labia are irritated with minimal white discharge.  Patient was last sexually active 12 years ago and low suspicion for STI at this time.  Patient is diabetic and suspect Candida.  Patient treated with Diflucan and topical.   Blanchie Dessert, MD 09/11/19 1011

## 2019-09-11 NOTE — ED Notes (Signed)
Attempted to call report. RN unavailable

## 2019-09-11 NOTE — BH Assessment (Addendum)
[place] Assessment Progress Note  Per [name], FNP, this pt requires psychiatric hospitalization.  Kathalene Frames, RN has assigned pt to [facility] Rm 303-2.  Pt presents under voluntary status, but when this writer spoke to pt, she refused to sign Voluntary Admission and Consent for Treatment.  This was staffed with Hampton Abbot, MD, who has determined that pt meets criteria for IVC, which she has initiated.  IVC documents have been faxed to [facility], and at [employer] confirms receipt.  She has since faxed Findings and Custody Order to this Probation officer.  At 13:50 I called [employer] and spoke to [employer], who took demographic information, agreeing to dispatch law enforcement to fill out Return of Service.  As of this writing, arrival of law enforcement is pending.  IVC documents have been sent to [facility].  Pt's nurse, Joellen Jersey, has been notified, and agrees to call report to [phone].  Pt is to be transported via Event organiser.  Pt previously signed Consent to Release Information to her sister, [name], and to pt's outpatient psychiatrist, Chucky May, MD, and a notification call has been placed to Dr [facility].   [name], [place] Coordinator [phone]

## 2019-09-11 NOTE — ED Notes (Addendum)
Pt c/o vaginal burning and pain. Messaged provider.

## 2019-09-11 NOTE — ED Notes (Addendum)
Called [facility] again for an update on pt transport. [facility] said they do not have a transport request in their system. They don't have an ETA. Said she would send it out for dispatch and someone will be on their way.

## 2019-09-11 NOTE — ED Notes (Signed)
[facility] police here to transport pt to [facility].

## 2019-09-11 NOTE — ED Notes (Signed)
Called [facility]. [facility] will dispatch an officer to transport pt.

## 2019-09-11 NOTE — Discharge Summary (Signed)
  Patient to be transferred to [place] for inpatient psychiatric treatment

## 2019-09-11 NOTE — ED Notes (Signed)
Attempted to call report to [facility]. Person who answered said to call back. RN not available at this time.

## 2019-09-12 ENCOUNTER — Other Ambulatory Visit: Payer: Self-pay

## 2019-09-12 LAB — GLUCOSE, CAPILLARY
Glucose-Capillary: 93 mg/dL (ref 70–99)
Glucose-Capillary: 79 mg/dL (ref 70–99)
Glucose-Capillary: 78 mg/dL (ref 70–99)
Glucose-Capillary: 141 mg/dL — ABNORMAL HIGH (ref 70–99)

## 2019-09-12 LAB — HEMOGLOBIN A1C
Hgb A1c MFr Bld: 9.9 % — ABNORMAL HIGH (ref 4.8–5.6)
Mean Plasma Glucose: 237.43 mg/dL

## 2019-09-12 MED ORDER — LORAZEPAM 1 MG PO TABS: 1.0000 mg | ORAL_TABLET | Freq: Three times a day (TID) | ORAL | Status: DC

## 2019-09-12 MED ORDER — LORAZEPAM 1 MG PO TABS
1.0000 mg | ORAL_TABLET | Freq: Three times a day (TID) | ORAL | Status: DC
  Administered 2019-09-12 – 2019-09-27 (×46): 1 mg via ORAL
  Filled 2019-09-12 (×45): qty 1

## 2019-09-12 MED ORDER — TIAGABINE HCL 2 MG PO TABS
2.0000 mg | ORAL_TABLET | Freq: Two times a day (BID) | ORAL | Status: DC
  Administered 2019-09-12 – 2019-09-13 (×2): 2 mg via ORAL
  Filled 2019-09-12 (×6): qty 1

## 2019-09-12 MED ORDER — DULOXETINE HCL 30 MG PO CPEP
90.0000 mg | ORAL_CAPSULE | Freq: Every day | ORAL | Status: DC
  Administered 2019-09-13 – 2019-09-14 (×2): 90 mg via ORAL
  Filled 2019-09-12 (×4): qty 3

## 2019-09-12 MED ORDER — DIAZEPAM 5 MG PO TABS: 5.0000 mg | ORAL_TABLET | Freq: Three times a day (TID) | ORAL | Status: DC

## 2019-09-12 MED ORDER — ACETYLCYSTEINE 20 % IN SOLN
600.0000 mg | Freq: Three times a day (TID) | RESPIRATORY_TRACT | Status: DC
  Administered 2019-09-12 – 2019-09-17 (×14): 600 mg via ORAL
  Filled 2019-09-12 (×12): qty 4

## 2019-09-12 MED ORDER — MIRTAZAPINE 15 MG PO TABS
15.0000 mg | ORAL_TABLET | Freq: Every day | ORAL | Status: DC
  Administered 2019-09-12 – 2019-09-13 (×2): 15 mg via ORAL
  Filled 2019-09-12 (×4): qty 1

## 2019-09-12 NOTE — BHH Suicide Risk Assessment (Signed)
[facility] Admission Suicide Risk Assessment   Nursing information obtained from:  Patient Demographic factors:  Living alone, Caucasian, Unemployed Current Mental Status:  Suicidal ideation indicated by patient Loss Factors:  Decline in physical health Historical Factors:  NA Risk Reduction Factors:  Positive social support  Total Time spent with patient: 30 minutes Principal Problem: MDD (major depressive disorder), recurrent severe, without psychosis ([place]) Diagnosis:  Principal Problem:   MDD (major depressive disorder), recurrent severe, without psychosis ([place]) Active Problems:   Benzodiazepine dependence, continuous ([place])  Subjective Data: Patient is seen and examined.  Patient is a 63 year old female with a past psychiatric history significant for major depression, generalized anxiety disorder, posttraumatic stress disorder and components of borderline personality disorder who presented to the [facility] emergency department on 09/10/2019 with suicidal ideation.  The patient stated that she felt as though "all I think about his darkness in the end".  The patient was significantly distraught during the interview.  She was crying throughout.  She stated that her depression symptoms have worsened over the last month to include feeling overwhelmed and more isolation.  The patient reported her main stressor was that she was moving to another apartment, and that was supposed to happen this week.  The patient stated she had no motivation for this.  She stated that she had suicidal thoughts without a plan.  She had a recent psychiatric hospitalization at our facility from 03/04/2019 to 03/15/2019.  She was treated for depression, but the symptoms did not improve greatly.  She was transferred to the [facility] on 6/18 for ECT.  She received 6 right unilateral ECT treatments during the time in the hospital.  She did improve.  Lexapro had been added for depression and  anxiety, BuSpar was also added, and she had been on chronic benzodiazepines, and this had been decreased to Ativan 1 mg p.o. twice daily.  It was recommended that she follow-up with maintenance ECT, but she declined this.  She was to follow-up with her primary psychiatrist Dr. Toy Care.  She stated that she had been having recent medication changes, but she could not remember exactly what they were.  She stated that most recently she had been taken lithium, Ativan, Valium.  She was admitted to the hospital for evaluation and stabilization.  Continued Clinical Symptoms:  Alcohol Use Disorder Identification Test Final Score (AUDIT): 0 The "Alcohol Use Disorders Identification Test", Guidelines for Use in Primary Care, Second Edition.  World Pharmacologist [facility]). Score between 0-7:  no or low risk or alcohol related problems. Score between 8-15:  moderate risk of alcohol related problems. Score between 16-19:  high risk of alcohol related problems. Score 20 or above:  warrants further diagnostic evaluation for alcohol dependence and treatment.   CLINICAL FACTORS:   Severe Anxiety and/or Agitation Panic Attacks Depression:   Anhedonia Hopelessness Impulsivity Insomnia Personality Disorders:   Cluster B More than one psychiatric diagnosis Previous Psychiatric Diagnoses and Treatments Medical Diagnoses and Treatments/Surgeries   Musculoskeletal: Strength & Muscle Tone: decreased Gait & Station: broad based Patient leans: N/A  Psychiatric Specialty Exam: Physical Exam  Nursing note and vitals reviewed. Constitutional: She is oriented to person, place, and time. She appears well-developed and well-nourished.  HENT:  Head: Normocephalic and atraumatic.  Respiratory: Effort normal.  Neurological: She is alert and oriented to person, place, and time.    Review of Systems  Blood pressure 102/66, pulse 85, temperature 97.6 F (36.4 C), temperature source Oral, resp.  rate 18, height 5'  5.5" (1.664 m), weight 77.6 kg, SpO2 100 %.Body mass index is 28.02 kg/m.  General Appearance: Disheveled  Eye Contact:  Fair  Speech:  Pressured  Volume:  Increased  Mood:  Unconsolable  Affect:  Labile  Thought Process:  Coherent and Descriptions of Associations: Intact  Orientation:  Full (Time, Place, and Person)  Thought Content:  Rumination  Suicidal Thoughts:  Yes.  without intent/plan  Homicidal Thoughts:  No  Memory:  Immediate;   Fair Recent;   Fair Remote;   Fair  Judgement:  Impaired  Insight:  Lacking  Psychomotor Activity:  Increased  Concentration:  Concentration: Fair and Attention Span: Fair  Recall:  Poor  Fund of Knowledge:  Fair  Language:  Good  Akathisia:  Negative  Handed:  Right  AIMS (if indicated):     Assets:  Desire for Improvement Resilience  ADL's:  Intact  Cognition:  WNL  Sleep:  Number of Hours: 6      COGNITIVE FEATURES THAT CONTRIBUTE TO RISK:  Closed-mindedness and Thought constriction (tunnel vision)    SUICIDE RISK:   Mild:  Suicidal ideation of limited frequency, intensity, duration, and specificity.  There are no identifiable plans, no associated intent, mild dysphoria and related symptoms, good self-control (both objective and subjective assessment), few other risk factors, and identifiable protective factors, including available and accessible social support.  PLAN OF CARE: Patient is seen and examined.  Patient is a 63 year old female with the above-stated past psychiatric history who presented with suicidal ideation and worsening depression.  She will be admitted to the hospital.  She will be integrated into the milieu.  She will be encouraged to attend groups.  She is unsure of her medications, and I have asked the staff to send a release of information to Dr. Starleen Arms office so we can get an accurate list of her medications.  In the short run because of my fear of benzodiazepine withdrawal I will start the 1 mg of lorazepam twice  daily.  Her diabetic medications will be continued.  Her Lantus will be at 72 units subcu every morning.  She will also be on sliding scale.  She is on Avapro 150 mg p.o. daily for hypertension.  She is also on Cymbalta 60 mg p.o. daily.  We will also continue her Lipitor.  Review of her laboratories revealed an elevated blood sugar at 139 on 12/14.  This morning it was 93.  Her creatinine is essentially normal at 1.05.  Her liver function enzymes are mildly elevated at 48 for her AST and 53 for her ALT.  These are slightly lower than previous.  Her CBC showed a mildly elevated white count at 12.  Platelets were at 331,000.  Her lithium level in the emergency room was 0.10.  Her hemoglobin A1c was 9.9.  Urinalysis was negative.  Drug screen was positive for benzodiazepines.  Her EKG showed a normal sinus rhythm with a QTC of 444.  I certify that inpatient services furnished can reasonably be expected to improve the patient's condition.   Sharma Covert, MD 09/12/2019, 10:25 AM

## 2019-09-12 NOTE — Progress Notes (Signed)
Pt was notified that the evening AA meeting was beginning but remained in bed.

## 2019-09-12 NOTE — Tx Team (Signed)
Initial Treatment Plan 09/12/2019 12:09 AM [name] KOR:[id number]    PATIENT STRESSORS: Health problems Medication change or noncompliance   PATIENT STRENGTHS: Average or above average intelligence General fund of knowledge Motivation for treatment/growth Supportive family/friends   PATIENT IDENTIFIED PROBLEMS:   depression  anxiety  Suicidal thoughts  loneliness  Recent medication change  "I want to get better"  "I want to think straight"       DISCHARGE CRITERIA:  Adequate post-discharge living arrangements Improved stabilization in mood, thinking, and/or behavior Medical problems require only outpatient monitoring Safe-care adequate arrangements made  PRELIMINARY DISCHARGE PLAN: Attend aftercare/continuing care group Attend PHP/IOP Placement in alternative living arrangements  PATIENT/FAMILY INVOLVEMENT: This treatment plan has been presented to and reviewed with the patient, Jeanette Rose,  The patient and family have been given the opportunity to ask questions and make suggestions.  JEHU-APPIAH, Megan Salon, RN 09/12/2019, 12:09 AM

## 2019-09-12 NOTE — Progress Notes (Signed)
Recreation Therapy Notes  Date:  12.16.20 Time: 0930 Location: 300 Hall Dayroom  Group Topic: Stress Management  Goal Area(s) Addresses:  Patient will identify positive stress management techniques. Patient will identify benefits of using stress management post d/c.  Intervention: Stress Management  Activity : Guided Imagery.  LRT read a script that took patients on a journey through the forest to enjoy the sites and sounds it has to offer.  Patients were to listen and follow along as the script was read to engage in activity.  Education:  Stress Management, Discharge Planning.   Education Outcome: Acknowledges Education  Clinical Observations/Feedback: Pt did not attend activity.     Victorino Sparrow, LRT/CTRS    Ria Comment, Peregrine Nolt A 09/12/2019 11:12 AM

## 2019-09-12 NOTE — Plan of Care (Signed)
Nurse discussed anxiety, depression and coping skills with patient.  

## 2019-09-12 NOTE — Tx Team (Signed)
Interdisciplinary Treatment and Diagnostic Plan Update  09/12/2019 Time of Session: 8:45am Jeanette Rose MRN: [id number]  Principal Diagnosis: <principal problem not specified>  Secondary Diagnoses: Active Problems:   MDD (major depressive disorder), recurrent severe, without psychosis ([place])   Current Medications:  Current Facility-Administered Medications  Medication Dose Route Frequency Provider Last Rate Last Admin  . aspirin EC tablet 81 mg  81 mg Oral Daily Rankin, Shuvon B, NP   81 mg at 09/12/19 0751  . atorvastatin (LIPITOR) tablet 80 mg  80 mg Oral QHS Rankin, Shuvon B, NP      . docusate sodium (COLACE) capsule 100 mg  100 mg Oral BID Rankin, Shuvon B, NP   100 mg at 09/12/19 0751  . DULoxetine (CYMBALTA) DR capsule 60 mg  60 mg Oral Daily Rankin, Shuvon B, NP   60 mg at 09/12/19 0751  . insulin aspart (novoLOG) injection 0-9 Units  0-9 Units Subcutaneous TID WC Rankin, Shuvon B, NP      . insulin glargine (LANTUS) injection 72 Units  72 Units Subcutaneous q morning - 10a Rankin, Shuvon B, NP      . irbesartan (AVAPRO) tablet 150 mg  150 mg Oral Daily Rankin, Shuvon B, NP   150 mg at 09/12/19 0752  . LORazepam (ATIVAN) tablet 1 mg  1 mg Oral BID Rankin, Shuvon B, NP   1 mg at 09/12/19 0752  . metFORMIN (GLUCOPHAGE-XR) 24 hr tablet 1,000 mg  1,000 mg Oral Q breakfast Rankin, Shuvon B, NP   1,000 mg at 09/12/19 0752  . miconazole (MICOTIN) 2 % cream   Topical BID Rankin, Shuvon B, NP   1 application at 91/79/15 0753  . pantoprazole (PROTONIX) EC tablet 40 mg  40 mg Oral BH-q7a Rankin, Shuvon B, NP   40 mg at 09/12/19 0751   PTA Medications: Medications Prior to Admission  Medication Sig Dispense Refill Last Dose  . aspirin 81 MG EC tablet Take 1 tablet (81 mg total) by mouth daily. 30 tablet 1   . atorvastatin (LIPITOR) 80 MG tablet Take 1 tablet (80 mg total) by mouth daily at 6 PM. 30 tablet 1   . Cyanocobalamin 1000 MCG TBCR Take 1,000 mcg by mouth daily.     . DULoxetine  (CYMBALTA) 60 MG capsule Take 60 mg by mouth daily.     . Insulin Glargine, 2 Unit Dial, (TOUJEO MAX SOLOSTAR) 300 UNIT/ML SOPN Inject 72 Units into the skin every morning.     . lithium 300 MG tablet Take 300 mg by mouth at bedtime.     Marland Kitchen LORazepam (ATIVAN) 1 MG tablet Take 1 tablet (1 mg total) by mouth 2 (two) times daily. (Patient taking differently: Take 1 mg by mouth 3 (three) times daily. ) 60 tablet 0   . metFORMIN (GLUCOPHAGE-XR) 500 MG 24 hr tablet Take 1,000 mg by mouth 2 (two) times a day.     . olmesartan (BENICAR) 20 MG tablet Take 20 mg by mouth daily.     Marland Kitchen omeprazole (PRILOSEC) 40 MG capsule Take 40 mg by mouth daily.       Patient Stressors: Health problems Medication change or noncompliance  Patient Strengths: Average or above average intelligence General fund of knowledge Motivation for treatment/growth Supportive family/friends  Treatment Modalities: Medication Management, Group therapy, Case management,  1 to 1 session with clinician, Psychoeducation, Recreational therapy.   Physician Treatment Plan for Primary Diagnosis: <principal problem not specified> Long Term Goal(s):     Short Term  Goals:    Medication Management: Evaluate patient's response, side effects, and tolerance of medication regimen.  Therapeutic Interventions: 1 to 1 sessions, Unit Group sessions and Medication administration.  Evaluation of Outcomes: Not Met  Physician Treatment Plan for Secondary Diagnosis: Active Problems:   MDD (major depressive disorder), recurrent severe, without psychosis ([place])  Long Term Goal(s):     Short Term Goals:       Medication Management: Evaluate patient's response, side effects, and tolerance of medication regimen.  Therapeutic Interventions: 1 to 1 sessions, Unit Group sessions and Medication administration.  Evaluation of Outcomes: Not Met   RN Treatment Plan for Primary Diagnosis: <principal problem not specified> Long Term Goal(s): Knowledge  of disease and therapeutic regimen to maintain health will improve  Short Term Goals: Ability to participate in decision making will improve, Ability to verbalize feelings will improve, Ability to disclose and discuss suicidal ideas and Ability to identify and develop effective coping behaviors will improve  Medication Management: RN will administer medications as ordered by provider, will assess and evaluate patient's response and provide education to patient for prescribed medication. RN will report any adverse and/or side effects to prescribing provider.  Therapeutic Interventions: 1 on 1 counseling sessions, Psychoeducation, Medication administration, Evaluate responses to treatment, Monitor vital signs and CBGs as ordered, Perform/monitor CIWA, COWS, AIMS and Fall Risk screenings as ordered, Perform wound care treatments as ordered.  Evaluation of Outcomes: Not Met   LCSW Treatment Plan for Primary Diagnosis: <principal problem not specified> Long Term Goal(s): Safe transition to appropriate next level of care at discharge, Engage patient in therapeutic group addressing interpersonal concerns.  Short Term Goals: Engage patient in aftercare planning with referrals and resources  Therapeutic Interventions: Assess for all discharge needs, 1 to 1 time with Social worker, Explore available resources and support systems, Assess for adequacy in community support network, Educate family and significant other(s) on suicide prevention, Complete Psychosocial Assessment, Interpersonal group therapy.  Evaluation of Outcomes: Not Met   Progress in Treatment: Attending groups: No. New to unit  Participating in groups: No. Taking medication as prescribed: Yes. Toleration medication: Yes. Family/Significant other contact made: No, will contact:  the patient's sister Patient understands diagnosis: Yes. Discussing patient identified problems/goals with staff: Yes. Medical problems stabilized or  resolved: Yes. Denies suicidal/homicidal ideation: Yes. Issues/concerns per patient self-inventory: No. Other:   New problem(s) identified: None   New Short Term/Long Term Goal(s):  medication stabilization, elimination of SI thoughts, development of comprehensive mental wellness plan.    Patient Goals:  "I just want to get better"  Discharge Plan or Barriers: Patient recently admitted. CSW will continue to follow and assess for appropriate referrals and possible discharge planning.    Reason for Continuation of Hospitalization: Anxiety Depression Medication stabilization Suicidal ideation  Estimated Length of Stay: 3-5 days   Attendees: Patient: Jeanette Rose 09/12/2019 9:08 AM  Physician: Dr. Myles Lipps, MD 09/12/2019 9:08 AM  Nursing: Rise Paganini.Raliegh Ip, RN 09/12/2019 9:08 AM  RN Care Manager: 09/12/2019 9:08 AM  Social Worker: [name], LCSW 09/12/2019 9:08 AM  Recreational Therapist:  09/12/2019 9:08 AM  Other: Harriett Sine, NP 09/12/2019 9:08 AM  Other:  09/12/2019 9:08 AM  Other: 09/12/2019 9:08 AM    Scribe for Treatment Team: Marylee Floras, [place] 09/12/2019 9:08 AM

## 2019-09-12 NOTE — BHH Counselor (Signed)
Adult Comprehensive Assessment  Patient ID: Jeanette Rose, female   DOB: [birth year], 63 y.o.   MRN: [id number]  Information Source:   Information source: Patient. Chart review completed   Current Stressors:  Patient states their primary concerns and needs for treatment are:: "I cant think straight, Im scared to shower and drive" Patient states having difficulty concentrating.  Patient states their goals for this hospitilization and ongoing recovery are:: "Get better" Educational / Learning stressors: N/A  Employment / Job issues: On disability; Patient denies any stressors  Family Relationships: None reported Financial / Lack of resources (include bankruptcy): Limited income; SSDI; retirement Housing / Lack of housing: Stable housing Physical health (include injuries & life threatening diseases): Diabetes II, using a walker today due to her feeling weak Social relationships: None reported  Substance abuse: Patient denies any substance use  Bereavement / Loss: Son deceased   Living/Environment/Situation:  Living Arrangements: Alone Living conditions (as described by patient or guardian): "Good"  Who else lives in the home?: Alone  How long has patient lived in current situation?: 11 years  What is atmosphere in current home: Comfortable   Family History:  Marital status: Divorced Divorced, when?: 2001  What types of issues is patient dealing with in the relationship?: "Patient reports her ex-husband struggled with alcoholism, was very controlling and insecure"  Are you sexually active?: No What is your sexual orientation?: Heterosexual  Has your sexual activity been affected by drugs, alcohol, medication, or emotional stress?: No  Does patient have children?: Yes How many children?: 2 How is patient's relationship with their children?: Patient reports 1 child alive(daughter), the other is deceased   Childhood History:  By whom was/is the patient raised?: Both parents Description  of patient's relationship with caregiver when they were a child: Patient reports having a good relationship with her parents during her childhood. ` Patient's description of current relationship with people who raised him/her: Patient reports she continues to have a good relationship with her mother. She states her father is currently deceased.  How were you disciplined when you got in trouble as a child/adolescent?:" Whooping/Switches " Does patient have siblings?: Yes Number of Siblings: 3 Description of patient's current relationship with siblings: Patient reports having a distant relationship with her two brothers, however she and her sister are very close  Did patient suffer any verbal/emotional/physical/sexual abuse as a child?: No Did patient suffer from severe childhood neglect?: No Has patient ever been sexually abused/assaulted/raped as an adolescent or adult?: No Was the patient ever a victim of a crime or a disaster?: No Witnessed domestic violence?: No Has patient been effected by domestic violence as an adult?: No   Education:  Highest grade of school patient has completed: GED Currently a Ship broker?: No Learning disability?: No   Employment/Work Situation:   Employment situation: On disability Why is patient on disability: Mental health  How long has patient been on disability: Unable to recall, chart review reveals 2 years Patient's job has been impacted by current illness: No What is the longest time patient has a held a job?: 25 years  Where was the patient employed at that time?: [employer]  Did You Receive Any Psychiatric Treatment/Services While in the [employer]?: No Are There Guns or Other Weapons in [place]?: No   Financial Resources:   Museum/gallery curator resources: Teacher, early years/pre, [employer], retirement Does patient have a Programmer, applications or guardian?: No   Alcohol/Substance Abuse:   What has been your use of drugs/alcohol within  the last 12 months?:  Denies  If attempted suicide, did drugs/alcohol play a role in this?: No Alcohol/Substance Abuse Treatment Hx: Denies past history Has alcohol/substance abuse ever caused legal problems?: No   Social Support System:   Patient's Community Support System: Good Describe Community Support System: "My sister"  Type of faith/religion: Christianity  How does patient's faith help to cope with current illness?: Prayer    Leisure/Recreation:   Leisure and Hobbies: "watching Nascar"    Strengths/Needs:   What is the patient's perception of their strengths?: "I dont know "  Patient states they can use these personal strengths during their treatment to contribute to their recovery: None reported  Patient states these barriers may affect/interfere with their treatment: None  Patient states these barriers may affect their return to the community: "My anxiety"  Other important information patient would like considered in planning for their treatment: No    Discharge Plan:   Currently receiving community mental health services: Yes (From Whom)(Dr. Chucky May ) Patient states concerns and preferences for aftercare planning are: Patient expressed interest in continuing to follow up with her current provider but says provider said "shes tried everything and unsure if she will see me again" Patient says she was last seen by provider last week Patient states they will know when they are safe and ready for discharge when: None reported Does patient have access to transportation?: Yes Does patient have financial barriers related to discharge medications?: No Will patient be returning to same living situation after discharge?: Yes    Summary/Recommendations:   Summary and Recommendations (to be completed by the evaluator): Jeanette Rose is a 63 year old female with a history of major depressive disorder. Patient reports she came to the hospital after experiencing disorganized thinking, confusion and anxiety. She  denies any substance use. She reports being followed by Dr. Toy Care and is unsure if she can return to the practice. Patient last seen at [facility] in June 2020. Recommendations include: crisis stabilization, medication management, therapeutic milieu and referral services. CSW assessing for appropriate referrals.  Claris Guymon T Chauntel [place]. 09/12/2019

## 2019-09-12 NOTE — Progress Notes (Signed)
D:  Patient's self inventory sheet, patient has fair sleep, sleep medication not helpful.  Good appetite, low energy level, poor concentration.  Rated depression and hopeless 45, anxiety 10.  Denied withdrawals.  Denied SI.  Physical problems, dizziness.  Physical pain, arms.  No pain medicine.  Goal is getting better, talk to MD, take meds.  Plans to talk to MD.  No discharge plans. A:  Medications administered per MD orders.  Emotional support and encouragement given patient. R:  Denied SI and HI, contracts for safety.  Denied A/V hallucinations.  Safety maintained with 15 minute checks.

## 2019-09-12 NOTE — Progress Notes (Signed)
Patient ID: Jeanette Rose, female   DOB: 10-Jun-1956, 63 y.o.   MRN: [id number] Admission note: Patient is a voluntary admission for depression, suicidal ideation without a plan.  Pt reports she is moving out of her trailer by the end of this week and has not been able to pack her belongings. Pt reports she had a medication change from ? To lithium on November 30. Pt reports she feels like she is losing her mind, can't drive, can't do anything. Pt reports current medication compliance. Pt is tearful throughout the interview and repeatedly asked writer to call her sister Jeanette Rose D) to get complete information about what is going on with her. Pt admitted to unit per protocol, skin assessment and belonging search done. No skin issues noted. Consent signed by pt. Pt educated on therapeutic milieu rules. Pt was introduced to milieu by nursing staff. Fall risk / suicide safety plan explained to the patient. 15 minutes checks started for safety.

## 2019-09-12 NOTE — H&P (Signed)
Psychiatric Admission Assessment Adult  Patient Identification: Jeanette Rose  MRN:  [id number]  Date of Evaluation:  09/12/2019  Chief Complaint:  MDD (major depressive disorder), recurrent severe, without psychosis ([place]) [F33.2]  Principal Diagnosis: MDD (major depressive disorder), recurrent severe, without psychosis ([place])  Diagnosis:  Principal Problem:   MDD (major depressive disorder), recurrent severe, without psychosis ([place]) Active Problems:   Benzodiazepine dependence, continuous ([place])  History of Present Illness: (Per Md's admission SRA): Patient is a 63 year old female with a past psychiatric history significant for major depression, generalized anxiety disorder, posttraumatic stress disorder and components of borderline personality disorder who presented to the [facility] emergency department on 09/10/2019 with suicidal ideation.  The patient stated that she felt as though "all I think about his darkness in the end".  The patient was significantly distraught during the interview.  She was crying throughout.  She stated that her depression symptoms have worsened over the last month to include feeling overwhelmed and more isolation.  The patient reported her main stressor was that she was moving to another apartment, and that was supposed to happen this week.  The patient stated she had no motivation for this.  She stated that she had suicidal thoughts without a plan.  She had a recent psychiatric hospitalization at our facility from 03/04/2019 to 03/15/2019.  She was treated for depression, but the symptoms did not improve greatly.  She was transferred to the [facility] on 6/18 for ECT.  She received 6 right unilateral ECT treatments during the time in the hospital.  She did improve.  Lexapro had been added for depression and anxiety, BuSpar was also added, and she had been on chronic benzodiazepines, and this had been decreased to Ativan 1 mg p.o.  twice daily.  It was recommended that she follow-up with maintenance ECT, but she declined this.  She was to follow-up with her primary psychiatrist Dr. Toy Care.  She stated that she had been having recent medication changes, but she could not remember exactly what they were.  She stated that most recently she had been taken lithium, Ativan, Valium.  She was admitted to the hospital for evaluation and stabilization.  Depression Symptoms:  depressed mood, anhedonia, insomnia, fatigue, feelings of worthlessness/guilt, difficulty concentrating, hopelessness, anxiety, panic attacks, loss of energy/fatigue,  (Hypo) Manic Symptoms:  Impulsivity, Irritable Mood, Labiality of Mood,  Anxiety Symptoms:  Excessive Worry,  Psychotic Symptoms:  denied  PTSD Symptoms: "I found my son dead in his bed in December 15, 2009". Re-experiencing:  Flashbacks Intrusive Thoughts  Total Time spent with patient: 1 hour  Past Psychiatric History: Patient have had previous psychiatric admissions here at [place].  She has had depression for many years.  She has had posttraumatic stress disorder for 9 years since finding her son dead in 2009-12-15.  She is followed by Dr. Toy Care locally.  She has been on multiple medications without great success including ECT.  Is the patient at risk to self? No.  Has the patient been a risk to self in the past 6 months? Yes.    Has the patient been a risk to self within the distant past? Yes.    Is the patient a risk to others? No.  Has the patient been a risk to others in the past 6 months? No.  Has the patient been a risk to others within the distant past? No.   Prior Inpatient Therapy: Yes (numerous inpatient psychiatric admissions including ECT).  Prior Outpatient  Therapy: Yes (Dr. Toy Care).  Alcohol Screening: 1. How often do you have a drink containing alcohol?: Never 2. How many drinks containing alcohol do you have on a typical day when you are drinking?: 1 or 2 3. How often do you  have six or more drinks on one occasion?: Never AUDIT-C Score: 0 4. How often during the last year have you found that you were not able to stop drinking once you had started?: Never 5. How often during the last year have you failed to do what was normally expected from you becasue of drinking?: Never 6. How often during the last year have you needed a first drink in the morning to get yourself going after a heavy drinking session?: Never 7. How often during the last year have you had a feeling of guilt of remorse after drinking?: Never 8. How often during the last year have you been unable to remember what happened the night before because you had been drinking?: Never 9. Have you or someone else been injured as a result of your drinking?: No 10. Has a relative or friend or a doctor or another health worker been concerned about your drinking or suggested you cut down?: No Alcohol Use Disorder Identification Test Final Score (AUDIT): 0 Alcohol Brief Interventions/Follow-up: AUDIT Score <7 follow-up not indicated  Substance Abuse History in the last 12 months: Yes.  (Excessive use of Ativan)  Consequences of Substance Abuse: Medical Consequences:  Liver damage, Possible death by overdose Legal Consequences:  Arrests, jail time, Loss of driving privilege. Family Consequences:  Family discord, divorce and or separation.  Previous Psychotropic Medications: Yes (numerous medication trials without success including ECT)  Psychological Evaluations: No   Past Medical History:  Past Medical History:  Diagnosis Date  . Acute acalculous cholecystitis s/p lap chole 04/03/2014 04/02/2014  . Allergy   . Anemia   . Anxiety   . Asthma   . Depression   . Fatty liver    noted on CT per [facility] records  . Fibromyalgia   . GERD (gastroesophageal reflux disease)   . Hyperlipidemia associated with type 2 diabetes mellitus ([place])   . Hypertension   . Hypothyroid 2015   Transiently in 2015. Synthroid 100mg  was stopped 05/07/2016 w/ tsh nml following.  . Obesity (BMI 30-39.9)   . OSA (obstructive sleep apnea)    non-compliant with CPAP  . Seborrheic dermatitis    [facility] face and [facility]  . Stroke ([facility]    TIA  . Type 2 diabetes mellitus [place]     Past Surgical History:  Procedure Laterality Date  . BREAST REDUCTION SURGERY  06/2008  . CHOLECYSTECTOMY N/A 04/03/2014   Procedure: LAPAROSCOPIC CHOLECYSTECTOMY ;  Surgeon: SAdin Hector MD;  Location: [facility] ORS;  Service: General;  Laterality: N/A;  . REDUCTION MAMMAPLASTY    . TUBAL LIGATION     Family History:  Family History  Problem Relation Age of Onset  . Atrial fibrillation Mother   . Heart disease Father        CAD  . Diabetes Brother   . Cancer Maternal Grandmother   . Diabetes Paternal Grandmother   . Fibromyalgia Sister   . Colitis Sister    Family Psychiatric  History: Denies any familial hx of mental illness, suicide attempts or completed suicide.  Tobacco Screening: Have you used any form of tobacco in the last 30 days? (Cigarettes, Smokeless Tobacco, Cigars, and/or Pipes): No  Social History:  Social  History   Substance and Sexual Activity  Alcohol Use No     Social History   Substance and Sexual Activity  Drug Use No    Additional Social History:  Allergies:   Allergies  Allergen Reactions  . Farxiga [Dapagliflozin] Anxiety and Other (See Comments)    Made depression and anxiety worse  . Rexulti [Brexpiprazole] Anxiety and Other (See Comments)    Made anxiety worse  . Trintellix [Vortioxetine] Anxiety and Other (See Comments)    Made anxiety worse   Lab Results:  Results for orders placed or performed during the hospital encounter of 09/11/19 (from the past 48 hour(s))  Glucose, capillary     Status: Abnormal   Collection Time: 09/11/19  8:52 PM  Result Value Ref Range   Glucose-Capillary 105 (H) 70 - 99 mg/dL  Glucose, capillary     Status: None   Collection Time:  09/12/19  5:17 AM  Result Value Ref Range   Glucose-Capillary 93 70 - 99 mg/dL   Comment 1 Notify RN    Comment 2 Document in Chart   Hemoglobin A1c     Status: Abnormal   Collection Time: 09/12/19  6:38 AM  Result Value Ref Range   Hgb A1c MFr Bld 9.9 (H) 4.8 - 5.6 %    Comment: (NOTE) Pre diabetes:          5.7%-6.4% Diabetes:              >6.4% Glycemic control for   <7.0% adults with diabetes    Mean Plasma Glucose 237.43 mg/dL    Comment: Performed at [facility], [address], [place], [place] [address]   Blood Alcohol level:  Lab Results  Component Value Date   [facility] <10 09/10/2019   ETH <10 62/94/7654   Metabolic Disorder Labs:  Lab Results  Component Value Date   HGBA1C 9.9 (H) 09/12/2019   MPG 237.43 09/12/2019   MPG 134.11 03/09/2019   No results found for: PROLACTIN Lab Results  Component Value Date   CHOL 174 10/09/2018   TRIG 210 (H) 10/09/2018   HDL 44 10/09/2018   CHOLHDL 4.0 10/09/2018   LDLCALC 88 10/09/2018   LDLCALC 77 06/28/2018   Current Medications: Current Facility-Administered Medications  Medication Dose Route Frequency Provider Last Rate Last Admin  . aspirin EC tablet 81 mg  81 mg Oral Daily Rankin, Shuvon B, NP   81 mg at 09/12/19 0751  . atorvastatin (LIPITOR) tablet 80 mg  80 mg Oral QHS Rankin, Shuvon B, NP      . docusate sodium (COLACE) capsule 100 mg  100 mg Oral BID Rankin, Shuvon B, NP   100 mg at 09/12/19 0751  . DULoxetine (CYMBALTA) DR capsule 60 mg  60 mg Oral Daily Rankin, Shuvon B, NP   60 mg at 09/12/19 0751  . insulin aspart (novoLOG) injection 0-9 Units  0-9 Units Subcutaneous TID WC Rankin, Shuvon B, NP      . insulin glargine (LANTUS) injection 72 Units  72 Units Subcutaneous q morning - 10a Rankin, Shuvon B, NP   72 Units at 09/12/19 1002  . irbesartan (AVAPRO) tablet 150 mg  150 mg Oral Daily Rankin, Shuvon B, NP   150 mg at 09/12/19 0752  . LORazepam (ATIVAN) tablet 1 mg  1 mg Oral BID Rankin, Shuvon B, NP    1 mg at 09/12/19 0752  . metFORMIN (GLUCOPHAGE-XR) 24 hr tablet 1,000 mg  1,000 mg Oral Q breakfast  Rankin, Shuvon B, NP   1,000 mg at 09/12/19 0752  . miconazole (MICOTIN) 2 % cream   Topical BID Rankin, Shuvon B, NP   1 application at 05/11/47 0753  . pantoprazole (PROTONIX) EC tablet 40 mg  40 mg Oral BH-q7a Rankin, Shuvon B, NP   40 mg at 09/12/19 0751   PTA Medications: Medications Prior to Admission  Medication Sig Dispense Refill Last Dose  . amantadine (SYMMETREL) 100 MG capsule Take 100 mg by mouth 2 (two) times daily.     . diazepam (VALIUM) 5 MG tablet Take 5-10 mg by mouth 3 (three) times daily as needed for anxiety.     . mirtazapine (REMERON) 15 MG tablet Take 15 mg by mouth at bedtime.     . ARIPiprazole (ABILIFY) 2 MG tablet Take 1 tablet by mouth daily.     Marland Kitchen atorvastatin (LIPITOR) 80 MG tablet Take 1 tablet (80 mg total) by mouth daily at 6 PM. 30 tablet 1   . Insulin Glargine, 2 Unit Dial, (TOUJEO MAX SOLOSTAR) 300 UNIT/ML SOPN Inject 72 Units into the skin every morning.     . metFORMIN (GLUCOPHAGE-XR) 500 MG 24 hr tablet Take 1,000 mg by mouth 2 (two) times a day.   Not Taking at Unknown time  . olmesartan (BENICAR) 20 MG tablet Take 20 mg by mouth daily.     Marland Kitchen omeprazole (PRILOSEC) 40 MG capsule Take 40 mg by mouth daily.      Musculoskeletal: Strength & Muscle Tone: within normal limits Gait & Station: normal Patient leans: N/A  Psychiatric Specialty Exam: Physical Exam  Nursing note and vitals reviewed. Constitutional: She is oriented to person, place, and time. She appears well-developed and well-nourished.  HENT:  Head: Normocephalic and atraumatic.  Cardiovascular: Normal rate.  Respiratory: Effort normal. She has no wheezes. She has no rales.  Genitourinary:    Genitourinary Comments: Deferred   Musculoskeletal:        General: Normal range of motion.     Cervical back: Normal range of motion.  Neurological: She is alert and oriented to person, place,  and time.  Skin: Skin is warm.    Review of Systems  Constitutional: Negative for chills and fever.  Respiratory: Negative for cough and shortness of breath.   Cardiovascular: Negative for chest pain and palpitations.  Gastrointestinal: Negative for abdominal pain, heartburn, nausea and vomiting.  Musculoskeletal: Negative for myalgias.  Skin: Negative.   Neurological: Negative for dizziness and headaches.  Endo/Heme/Allergies: Positive for environmental allergies.  Psychiatric/Behavioral: Positive for depression and substance abuse (UDS (+) for Benzodiazepine). Negative for memory loss. The patient is nervous/anxious and has insomnia.     Blood pressure 102/66, pulse 85, temperature 97.6 F (36.4 C), temperature source Oral, resp. rate 18, height 5' 5.5" (1.664 m), weight 77.6 kg, SpO2 100 %.Body mass index is 28.02 kg/m.  General Appearance: Disheveled, tearful  Eye Contact:  Poor  Speech:  Clear and Coherent and Slow  Volume:  Decreased  Mood:  Anxious, Depressed, Hopeless and Worthless  Affect:  Depressed, Flat and Tearful  Thought Process:  Coherent and Descriptions of Associations: Circumstantial  Orientation:  Full (Time, Place, and Person)  Thought Content:  Obsessions, Rumination and Tangential  Suicidal Thoughts:  Currently denies any thoughts, plans or intent.  Homicidal Thoughts:  Denies  Memory:  Immediate;   Good Recent;   Good Remote;   Good  Judgement:  Intact  Insight:  Lacking  Psychomotor Activity:  Decreased  Concentration:  Concentration: Poor and Attention Span: Poor  Recall:  Good  Fund of Knowledge:  Fair  Language:  Good  Akathisia:  Negative  Handed:  Right  AIMS (if indicated):     Assets:  Desire for Improvement Social Support  ADL's:  Intact  Cognition:  WNL  Sleep:  Number of Hours: 6   Treatment Plan Summary: Daily contact with patient to assess and evaluate symptoms and progress in treatment and Medication management.  Treatment  Plan/Recommendations:  1. Admit for crisis management and stabilization, estimated length of stay 3-5 days.    2. Medication management to reduce current symptoms to base line and improve the patient's overall level of functioning: See MAR, Md's SRA & treatment plan.   Observation Level/Precautions:  15 minute checks  Laboratory:  Per ED, UDS (+) for Benzodiazepine  Psychotherapy: Group sessions  Medications: See [facility]   Consultations: As needed.   Discharge Concerns: Safety, mood stability.   Estimated LOS: 3-5 days  Other:  Admit to the 300-hall.   Physician Treatment Plan for Primary Diagnosis: MDD (major depressive disorder), recurrent severe, without psychosis ([place])  Long Term Goal(s): Improvement in symptoms so as ready for discharge  Short Term Goals: Ability to identify changes in lifestyle to reduce recurrence of condition will improve, Ability to verbalize feelings will improve and Ability to disclose and discuss suicidal ideas  Physician Treatment Plan for Secondary Diagnosis: Principal Problem:   MDD (major depressive disorder), recurrent severe, without psychosis ([place]) Active Problems:   Benzodiazepine dependence, continuous ([employer])  Long Term Goal(s): Improvement in symptoms so as ready for discharge  Short Term Goals: Ability to identify and develop effective coping behaviors will improve, Ability to maintain clinical measurements within normal limits will improve and Compliance with prescribed medications will improve  I certify that inpatient services furnished can reasonably be expected to improve the patient's condition.    Lindell Spar, NP, PMHNP, FNP-BC 12/16/202010:12 AM

## 2019-09-13 DIAGNOSIS — F332 Major depressive disorder, recurrent severe without psychotic features: Principal | ICD-10-CM

## 2019-09-13 DIAGNOSIS — F1320 Sedative, hypnotic or anxiolytic dependence, uncomplicated: Secondary | ICD-10-CM

## 2019-09-13 LAB — GLUCOSE, CAPILLARY
Glucose-Capillary: 94 mg/dL (ref 70–99)
Glucose-Capillary: 167 mg/dL — ABNORMAL HIGH (ref 70–99)
Glucose-Capillary: 125 mg/dL — ABNORMAL HIGH (ref 70–99)
Glucose-Capillary: 100 mg/dL — ABNORMAL HIGH (ref 70–99)
Glucose-Capillary: 126 mg/dL — ABNORMAL HIGH (ref 70–99)

## 2019-09-13 MED ORDER — TIAGABINE HCL 4 MG PO TABS
4.0000 mg | ORAL_TABLET | Freq: Two times a day (BID) | ORAL | Status: DC
  Administered 2019-09-13 – 2019-09-14 (×2): 4 mg via ORAL
  Filled 2019-09-13 (×6): qty 1

## 2019-09-13 NOTE — Plan of Care (Signed)
Progress note  D: pt found in bed; compliant with medication administration. Pt states they slept "ok". Pt states they feel as though they are losing some of their muscle strength. This Probation officer also has complaints about not being able to use a walker here. Pt stated they didn't use one at home but they feel weak at [facility]. Pt encouraged to ambulate and socialize with others. Pt states their goal for today is to get better. Pt denies any physical pain or symptoms besides the weakness. Pt denies si/hi/ah/vh and verbally agrees to approach staff if these become apparent or before harming themself/others while at [place].  A: Pt provided support and encouragement. Pt given medication per protocol and standing orders. Q75msafety checks implemented and continued.  R: Pt safe on the unit. Will continue to monitor.  Pt progressing in the following metrics  Problem: Education: Goal: Ability to state activities that reduce stress will improve Outcome: Progressing   Problem: Coping: Goal: Ability to identify and develop effective coping behavior will improve Outcome: Progressing   Problem: Self-Concept: Goal: Ability to identify factors that promote anxiety will improve Outcome: Progressing

## 2019-09-13 NOTE — Progress Notes (Signed)
   09/13/19 0022  Psych Admission Type (Psych Patients Only)  Admission Status Voluntary  Psychosocial Assessment  Patient Complaints Anxiety  Eye Contact Brief  Facial Expression Anxious  Affect Anxious;Depressed  Speech Logical/coherent  Interaction Attention-seeking;Childlike  Motor Activity Slow;Unsteady  Appearance/Hygiene In scrubs  Behavior Characteristics Anxious  Mood Anxious;Depressed  Thought Process  Coherency Blocking;Disorganized  Content Blaming self  Delusions None reported or observed  Perception WDL  Hallucination None reported or observed  Judgment Poor  Confusion None  Danger to Self  Current suicidal ideation? Denies  Danger to Others  Danger to Others None reported or observed  D: Patient reports she is doing well. Pt anxious but cooperative.  A: Medications administered as prescribed. Support and encouragement provided as needed.  R: Patient remains safe on the unit. Will continue to monitor for safety and stability.

## 2019-09-13 NOTE — Progress Notes (Addendum)
   09/13/19 2142  Psych Admission Type (Psych Patients Only)  Admission Status Voluntary  Psychosocial Assessment  Patient Complaints Crying spells;Anxiety;Sadness;Loneliness  Eye Contact Intense  Facial Expression Anxious;Sad;Wide-eyed  Affect Anxious;Sad;Fearful  Speech Logical/coherent;Slow  Interaction Attention-seeking  Motor Activity Slow  Appearance/Hygiene In scrubs;Disheveled  Behavior Characteristics Cooperative;Anxious;Agitated  Mood Depressed;Anxious;Labile;Sad  Thought Process  Coherency WDL  Content Obsessions;Blaming others  Delusions None reported or observed  Perception WDL  Hallucination None reported or observed  Judgment Poor  Confusion None  Danger to Self  Current suicidal ideation? Denies  Danger to Others  Danger to Others None reported or observed   Pt is at nurse's desk. Pt c/o feeling confused and started crying. She says that she was supposed to move into an apartment this coming Saturday but since she is here, she canceled the moving company. Pt c/o just feeling overwhelmed because she has to move. Says that the landlord that owns the property is evicting her. Pt says that she hurts and doesn't feel able to handle anything. Pt says that she is fearful to drive or even shower. Pt wants to sleep. Pt snores. She denies SI, HI and AVH. Tried to get more information from pt but she began crying again.

## 2019-09-13 NOTE — Progress Notes (Signed)
[facility] MD Progress Note  09/13/2019 12:52 PM Jeanette Rose  MRN:  [id number]  Subjective: Jeanette Rose reports, "I'm still feeling the same. I can't really think. I was sitting in the day room area earlier, watching TV. But, I could not really see what was going on on TV & I tried to pay attention, but could not. I'm doing okay on the medicines, that was all about it. I feel like my body is shutting down. I don't know what to do. My anxiety is actually worse than my depression. I'm unable to rate my depression. But my anxiety is at #8.  Objective: Patient is a 63 year old female with a past psychiatric history significant for major depression, generalized anxiety disorder, posttraumatic stress disorder and components of borderline personality disorder who presented to the [facility] emergency department on 09/10/2019 with suicidal ideation. The patient stated that she felt as though "all I think about his darkness in the end". Jeanette Rose is seen, chart reviewed. The chart findings discussed with the treatment. She remains depressed & anxious. See the subjective reports above. She is alert, out of her room to the dayroom with a lot of encouragement. She has been ordered OT consult, will be evaluated tomorrow. Jeanette Rose denies any side effects from her medications. She denies any SIHI, AVH, delusional thoughts or paranoia. She does not appear to be responding to some internal stimuli. Her Gabitril is increased from 2 mg to 4 mg po bid for anxiety & mood lability. She is in agreement to continue her current plan of care as already in progress.  Principal Problem: MDD (major depressive disorder), recurrent severe, without psychosis ([place])  Diagnosis: Principal Problem:   MDD (major depressive disorder), recurrent severe, without psychosis ([place]) Active Problems:   Benzodiazepine dependence, continuous ([place])  Total Time spent with patient: 25 minutes  Past Psychiatric History: See H&P  Past Medical  History:  Past Medical History:  Diagnosis Date  . Acute acalculous cholecystitis s/p lap chole 04/03/2014 04/02/2014  . Allergy   . Anemia   . Anxiety   . Asthma   . Depression   . Fatty liver    noted on CT per [facility] records  . Fibromyalgia   . GERD (gastroesophageal reflux disease)   . Hyperlipidemia associated with type 2 diabetes mellitus ([place])   . Hypertension   . Hypothyroid 2015   Transiently in 2015. Synthroid 80mg was stopped 05/07/2016 w/ tsh nml following.  . Obesity (BMI 30-39.9)   . OSA (obstructive sleep apnea)    non-compliant with CPAP  . Seborrheic dermatitis    on face and scalp[facility]  . Stroke ([facility]    TIA  . Type 2 diabetes mellitus ([place]     Past Surgical History:  Procedure Laterality Date  . BREAST REDUCTION SURGERY  06/2008  . CHOLECYSTECTOMY N/A 04/03/2014   Procedure: LAPAROSCOPIC CHOLECYSTECTOMY ;  Surgeon: SAdin Hector MD;  Location: [facility] ORS;  Service: General;  Laterality: N/A;  . REDUCTION MAMMAPLASTY    . TUBAL LIGATION     Family History:  Family History  Problem Relation Age of Onset  . Atrial fibrillation Mother   . Heart disease Father        CAD  . Diabetes Brother   . Cancer Maternal Grandmother   . Diabetes Paternal Grandmother   . Fibromyalgia Sister   . Colitis Sister    Family Psychiatric  History: See H&P  Social History:  Social History   Substance  and Sexual Activity  Alcohol Use No     Social History   Substance and Sexual Activity  Drug Use No    Social History   Socioeconomic History  . Marital status: Divorced    Spouse name: Not on file  . Number of children: Not on file  . Years of education: Not on file  . Highest education level: Not on file  Occupational History  . Not on file  Tobacco Use  . Smoking status: Former Smoker    Packs/day: 4.00    Years: 15.00    Pack years: 60.00    Types: Cigarettes    Quit date: 04/22/2004    Years since quitting: 15.4  . Smokeless  tobacco: Never Used  . Tobacco comment: 4 pack year hx  Substance and Sexual Activity  . Alcohol use: No  . Drug use: No  . Sexual activity: Never  Other Topics Concern  . Not on file  Social History Narrative  . Not on file   Social Determinants of Health   Financial Resource Strain:   . Difficulty of Paying Living Expenses: Not on file  Food Insecurity:   . Worried About Charity fundraiser in the Last Year: Not on file  . Ran Out of Food in the Last Year: Not on file  Transportation Needs:   . Lack of Transportation (Medical): Not on file  . Lack of Transportation (Non-Medical): Not on file  Physical Activity:   . Days of Exercise per Week: Not on file  . Minutes of Exercise per Session: Not on file  Stress:   . Feeling of Stress : Not on file  Social Connections:   . Frequency of Communication with Friends and Family: Not on file  . Frequency of Social Gatherings with Friends and Family: Not on file  . Attends Religious Services: Not on file  . Active Member of Clubs or Organizations: Not on file  . Attends Archivist Meetings: Not on file  . Marital Status: Not on file   Additional Social History:   Sleep: Fair  Appetite:  Good  Current Medications: Current Facility-Administered Medications  Medication Dose Route Frequency Provider Last Rate Last Admin  . acetylcysteine (MUCOMYST) 20 % nebulizer / oral solution 600 mg  600 mg Oral TID Sharma Covert, MD   600 mg at 09/13/19 1210  . aspirin EC tablet 81 mg  81 mg Oral Daily Rankin, Shuvon B, NP   81 mg at 09/13/19 0750  . atorvastatin (LIPITOR) tablet 80 mg  80 mg Oral QHS Rankin, Shuvon B, NP   80 mg at 09/12/19 2114  . docusate sodium (COLACE) capsule 100 mg  100 mg Oral BID Rankin, Shuvon B, NP   100 mg at 09/13/19 0749  . DULoxetine (CYMBALTA) DR capsule 90 mg  90 mg Oral Daily Sharma Covert, MD   90 mg at 09/13/19 0749  . insulin aspart (novoLOG) injection 0-9 Units  0-9 Units Subcutaneous  TID WC Rankin, Shuvon B, NP   1 Units at 09/13/19 1209  . insulin glargine (LANTUS) injection 72 Units  72 Units Subcutaneous q morning - 10a Rankin, Shuvon B, NP   72 Units at 09/13/19 1039  . irbesartan (AVAPRO) tablet 150 mg  150 mg Oral Daily Rankin, Shuvon B, NP   150 mg at 09/13/19 0749  . LORazepam (ATIVAN) tablet 1 mg  1 mg Oral TID Sharma Covert, MD   1 mg at 09/13/19 1210  .  metFORMIN (GLUCOPHAGE-XR) 24 hr tablet 1,000 mg  1,000 mg Oral Q breakfast Rankin, Shuvon B, NP   1,000 mg at 09/13/19 0750  . miconazole (MICOTIN) 2 % cream   Topical BID Rankin, Shuvon B, NP   Given at 09/13/19 0750  . mirtazapine (REMERON) tablet 15 mg  15 mg Oral QHS Sharma Covert, MD   15 mg at 09/12/19 2114  . pantoprazole (PROTONIX) EC tablet 40 mg  40 mg Oral BH-q7a Rankin, Shuvon B, NP   40 mg at 09/13/19 0610  . tiaGABine (GABITRIL) tablet 2 mg  2 mg Oral BID Sharma Covert, MD   2 mg at 09/13/19 6546   Lab Results:  Results for orders placed or performed during the hospital encounter of 09/11/19 (from the past 48 hour(s))  Glucose, capillary     Status: Abnormal   Collection Time: 09/11/19  8:52 PM  Result Value Ref Range   Glucose-Capillary 105 (H) 70 - 99 mg/dL  Glucose, capillary     Status: None   Collection Time: 09/12/19  5:17 AM  Result Value Ref Range   Glucose-Capillary 93 70 - 99 mg/dL   Comment 1 Notify RN    Comment 2 Document in Chart   Hemoglobin A1c     Status: Abnormal   Collection Time: 09/12/19  6:38 AM  Result Value Ref Range   Hgb A1c MFr Bld 9.9 (H) 4.8 - 5.6 %    Comment: (NOTE) Pre diabetes:          5.7%-6.4% Diabetes:              >6.4% Glycemic control for   <7.0% adults with diabetes    Mean Plasma Glucose 237.43 mg/dL    Comment: Performed at [facility], [place] [address], [place], [place] [address]  Glucose, capillary     Status: None   Collection Time: 09/12/19 11:58 AM  Result Value Ref Range   Glucose-Capillary 79 70 - 99 mg/dL   Comment  1 Notify RN    Comment 2 Document in Chart   Glucose, capillary     Status: None   Collection Time: 09/12/19  5:03 PM  Result Value Ref Range   Glucose-Capillary 78 70 - 99 mg/dL   Comment 1 Notify RN    Comment 2 Document in Chart   Glucose, capillary     Status: Abnormal   Collection Time: 09/12/19  8:56 PM  Result Value Ref Range   Glucose-Capillary 141 (H) 70 - 99 mg/dL   Comment 1 Notify RN    Comment 2 Document in Chart   Glucose, capillary     Status: None   Collection Time: 09/13/19  5:16 AM  Result Value Ref Range   Glucose-Capillary 94 70 - 99 mg/dL   Comment 1 Notify RN    Comment 2 Document in Chart   Glucose, capillary     Status: Abnormal   Collection Time: 09/13/19 10:14 AM  Result Value Ref Range   Glucose-Capillary 167 (H) 70 - 99 mg/dL   Comment 1 Notify RN   Glucose, capillary     Status: Abnormal   Collection Time: 09/13/19 12:05 PM  Result Value Ref Range   Glucose-Capillary 125 (H) 70 - 99 mg/dL   Comment 1 Notify RN    Blood Alcohol level:  Lab Results  Component Value Date   ETH <10 09/10/2019   ETH <10 65/68/1275   Metabolic Disorder Labs: Lab Results  Component Value  Date   HGBA1C 9.9 (H) 09/12/2019   MPG 237.43 09/12/2019   MPG 134.11 03/09/2019   No results found for: PROLACTIN Lab Results  Component Value Date   CHOL 174 10/09/2018   TRIG 210 (H) 10/09/2018   HDL 44 10/09/2018   CHOLHDL 4.0 10/09/2018   LDLCALC 88 10/09/2018   LDLCALC 77 06/28/2018   Physical Findings: AIMS: Facial and Oral Movements Muscles of Facial Expression: None, normal Lips and Perioral Area: None, normal Jaw: None, normal Tongue: None, normal,Extremity Movements Upper (arms, wrists, hands, fingers): None, normal Lower (legs, knees, ankles, toes): None, normal, Trunk Movements Neck, shoulders, hips: None, normal, Overall Severity Severity of abnormal movements (highest score from questions above): None, normal Incapacitation due to abnormal movements:  None, normal Patient's awareness of abnormal movements (rate only patient's report): No Awareness, Dental Status Current problems with teeth and/or dentures?: No Does patient usually wear dentures?: No  CIWA:  CIWA-Ar Total: 1 COWS:  COWS Total Score: 2  Musculoskeletal: Strength & Muscle Tone: within normal limits Gait & Station: normal Patient leans: N/A  Psychiatric Specialty Exam: Physical Exam  Nursing note and vitals reviewed. Constitutional: She is oriented to person, place, and time. She appears well-developed.  Cardiovascular:  Elevated pulse rate: 115  Respiratory: Effort normal.  Genitourinary:    Genitourinary Comments: Deferred   Musculoskeletal:        General: Normal range of motion.     Cervical back: Normal range of motion.  Neurological: She is alert and oriented to person, place, and time.  Skin: Skin is warm and dry.    Review of Systems  Constitutional: Negative for chills and fever.  HENT: Negative for congestion and sore throat.   Respiratory: Negative for cough, shortness of breath and wheezing.   Cardiovascular: Negative for chest pain and palpitations.  Gastrointestinal: Negative for diarrhea, nausea and vomiting.  Skin: Negative for color change.  Psychiatric/Behavioral: Positive for decreased concentration and dysphoric mood. Negative for agitation, behavioral problems, confusion, hallucinations, self-injury, sleep disturbance and suicidal ideas. The patient is nervous/anxious. The patient is not hyperactive.     Blood pressure 101/62, pulse (!) 115, temperature 97.7 F (36.5 C), temperature source Oral, resp. rate 20, height 5' 5.5" (1.664 m), weight 77.6 kg, SpO2 99 %.Body mass index is 28.02 kg/m.  General Appearance: Disheveled  Eye Contact:  Fair  Speech:  Clear and Coherent and Normal Rate  Volume:  Normal  Mood:  Anxious, Depressed and Dysphoric  Affect:  Flat  Thought Process:  Coherent and Descriptions of Associations: Intact   Orientation:  Full (Time, Place, and Person)  Thought Content:  Rumination  Suicidal Thoughts:  Denies any thoughts, plans or intent  Homicidal Thoughts:  Denies  Memory:  Immediate;   Fair Recent;   Fair Remote;   Fair  Judgement:  Intact  Insight:  Lacking  Psychomotor Activity:  Decreased  Concentration:  Concentration: Fair and Attention Span: Fair  Recall:  Good  Fund of Knowledge:  Fair  Language:  Good  Akathisia:  Negative  Handed:  Right  AIMS (if indicated):     Assets:  Communication Skills Desire for Improvement Social Support  ADL's:  Intact  Cognition:  WNL  Sleep:  Number of Hours: 5.5   Treatment Plan Summary: Daily contact with patient to assess and evaluate symptoms and progress in treatment and Medication management.  -Continue inpatient hospitalization.  -Will continue today 09/13/2019 plan as below except where it is noted.  Depression   -  Continue Cymbalta 90 mg po daily.             -Continue Mirtazapine 15 mg po Q hs for depression/anxiety.  Mood lability.              -Continue Gabitril 4 mg po bid for anxiety as well.  -Anxiety  -Continue Lorazepam 1 mg po tid.  -Insomnia  -Continue Mirtazapine 15 mg po Q hs also for depression.  -Other medical issues.  -Continue lisinopril Metformin XR 1,000 mg po daily for DM.             -Continue Protonix 40 mg po Q am.             -Continue AVAPRO 150 mg po qd for HTN.             -Continue Lantus insulin 72 units Q morning for DM.             -Continue the sliding scale Novolog insulin per BS result.             -Continue Colace 100 mg po bid for constipation.             -Continue Lipitor 80 mg po Q evenings for high cholesterol.             -Asa 81 mg po daily for heart health.             -Mucomyst 20% nebulizer/oral 600 mg  Po tid.              -Continue Micotin 2%, apply topically, apply to vaginal area bid.   -Encourage participation in groups and therapeutic milieu  -Disposition  planning will be ongoing  Lindell Spar, NP, PMHNP, FNP-BC 09/13/2019, 12:52 PM

## 2019-09-13 NOTE — Plan of Care (Signed)
Pt is attention-seeking and says she feels overwhelmed by everything in her life.  Problem: Self-Concept: Goal: Ability to identify factors that promote anxiety will improve Outcome: Progressing   Problem: Education: Goal: Knowledge of the prescribed therapeutic regimen will improve Outcome: Progressing   Problem: Activity: Goal: Interest or engagement in leisure activities will improve Outcome: Progressing Goal: Imbalance in normal sleep/wake cycle will improve Outcome: Progressing   Problem: Coping: Goal: Will verbalize feelings Outcome: Progressing

## 2019-09-14 LAB — COMPREHENSIVE METABOLIC PANEL
ALT: 33 U/L (ref 0–44)
AST: 26 U/L (ref 15–41)
Albumin: 3.6 g/dL (ref 3.5–5.0)
Alkaline Phosphatase: 95 U/L (ref 38–126)
Anion gap: 10 (ref 5–15)
BUN: 19 mg/dL (ref 8–23)
CO2: 24 mmol/L (ref 22–32)
Calcium: 9.4 mg/dL (ref 8.9–10.3)
Chloride: 101 mmol/L (ref 98–111)
Creatinine, Ser: 0.82 mg/dL (ref 0.44–1.00)
GFR calc Af Amer: >60 mL/min (ref >60)
GFR calc non Af Amer: >60 mL/min (ref >60)
Glucose, Bld: 241 mg/dL — ABNORMAL HIGH (ref 70–99)
Potassium: 4.4 mmol/L (ref 3.5–5.1)
Sodium: 135 mmol/L (ref 135–145)
Total Bilirubin: 0.4 mg/dL (ref 0.3–1.2)
Total Protein: 6.8 g/dL (ref 6.5–8.1)

## 2019-09-14 LAB — CBC WITH DIFFERENTIAL/PLATELET
Abs Immature Granulocytes: 0.03 10*3/uL (ref 0.00–0.07)
Basophils Absolute: 0.1 10*3/uL (ref 0.0–0.1)
Basophils Relative: 1 %
Eosinophils Absolute: 0.3 10*3/uL (ref 0.0–0.5)
Eosinophils Relative: 4 %
HCT: 37.9 % (ref 36.0–46.0)
Hemoglobin: 12.4 g/dL (ref 12.0–15.0)
Immature Granulocytes: 0 %
Lymphocytes Relative: 35 %
Lymphs Abs: 2.8 10*3/uL (ref 0.7–4.0)
MCH: 29.7 pg (ref 26.0–34.0)
MCHC: 32.7 g/dL (ref 30.0–36.0)
MCV: 90.7 fL (ref 80.0–100.0)
Monocytes Absolute: 0.5 10*3/uL (ref 0.1–1.0)
Monocytes Relative: 6 %
Neutro Abs: 4.5 10*3/uL (ref 1.7–7.7)
Neutrophils Relative %: 54 %
Platelets: 209 10*3/uL (ref 150–400)
RBC: 4.18 MIL/uL (ref 3.87–5.11)
RDW: 14.5 % (ref 11.5–15.5)
WBC: 8.1 10*3/uL (ref 4.0–10.5)
nRBC: 0 % (ref 0.0–0.2)

## 2019-09-14 LAB — GLUCOSE, CAPILLARY
Glucose-Capillary: 34 mg/dL — CL (ref 70–99)
Glucose-Capillary: 121 mg/dL — ABNORMAL HIGH (ref 70–99)
Glucose-Capillary: 61 mg/dL — ABNORMAL LOW (ref 70–99)
Glucose-Capillary: 133 mg/dL — ABNORMAL HIGH (ref 70–99)
Glucose-Capillary: 186 mg/dL — ABNORMAL HIGH (ref 70–99)
Glucose-Capillary: 178 mg/dL — ABNORMAL HIGH (ref 70–99)

## 2019-09-14 MED ORDER — DM-GUAIFENESIN ER 30-600 MG PO TB12
1.0000 | ORAL_TABLET | Freq: Two times a day (BID) | ORAL | Status: DC | PRN
  Administered 2019-09-15 – 2019-09-16 (×2): 1 via ORAL
  Filled 2019-09-14 (×2): qty 1

## 2019-09-14 MED ORDER — DULOXETINE HCL 60 MG PO CPEP
60.0000 mg | ORAL_CAPSULE | Freq: Two times a day (BID) | ORAL | Status: DC
  Administered 2019-09-14 – 2019-09-17 (×6): 60 mg via ORAL
  Filled 2019-09-14 (×10): qty 1

## 2019-09-14 MED ORDER — INSULIN GLARGINE 100 UNIT/ML ~~LOC~~ SOLN
30.0000 [IU] | Freq: Every morning | SUBCUTANEOUS | Status: DC
  Administered 2019-09-14 – 2019-09-17 (×4): 30 [IU] via SUBCUTANEOUS

## 2019-09-14 MED ORDER — OLANZAPINE 2.5 MG PO TABS
2.5000 mg | ORAL_TABLET | Freq: Every day | ORAL | Status: DC
  Administered 2019-09-14 – 2019-09-16 (×3): 2.5 mg via ORAL
  Filled 2019-09-14 (×5): qty 1

## 2019-09-14 MED ORDER — BUSPIRONE HCL 10 MG PO TABS
10.0000 mg | ORAL_TABLET | Freq: Three times a day (TID) | ORAL | Status: DC
  Administered 2019-09-14 – 2019-09-17 (×9): 10 mg via ORAL
  Filled 2019-09-14: qty 2
  Filled 2019-09-14 (×3): qty 1
  Filled 2019-09-14: qty 2
  Filled 2019-09-14 (×12): qty 1

## 2019-09-14 MED ORDER — MIRTAZAPINE 30 MG PO TABS
30.0000 mg | ORAL_TABLET | Freq: Every day | ORAL | Status: DC
  Administered 2019-09-14 – 2019-09-18 (×5): 30 mg via ORAL
  Filled 2019-09-14 (×7): qty 1

## 2019-09-14 NOTE — Progress Notes (Signed)
This RN entered pt room d/t pt coughing loudly. Pt asked if alright. Pt stated that she believes she "wet her pants." Pt assessed and found to have urinated in bed. Pt assisted into gown and to the bathroom where she washed herself. Pt given new panties and incontinence pad. Other bed in room made up for pt. Pt moving slowly and appears unsteady. Pt scrubs placed in paper bag for washing. Pt did not ask for water or anything else for her cough. Pt back in bed safely.

## 2019-09-14 NOTE — Progress Notes (Addendum)
Pt began coughing. MHT checked on pt and pt says she may have urinated. Pt instructed to get up and change incontinence pad. She already has one on. Pt also asked for something for cough. Will contact provider concerning this.

## 2019-09-14 NOTE — Progress Notes (Signed)
   09/14/19 2203  Psych Admission Type (Psych Patients Only)  Admission Status Voluntary  Psychosocial Assessment  Patient Complaints Sadness;Helplessness;Other (Comment) (weakness, drowsiness)  Eye Contact Fair  Facial Expression Anxious;Sad  Affect Anxious;Sad;Fearful  Speech Logical/coherent;Slow  Interaction Attention-seeking;Assertive  Motor Activity Slow  Appearance/Hygiene Disheveled;In hospital gown  Behavior Characteristics Cooperative;Anxious  Mood Depressed;Sad  Thought Process  Coherency WDL  Content Obsessions  Delusions None reported or observed  Perception WDL  Hallucination None reported or observed  Judgment Poor  Confusion None  Danger to Self  Current suicidal ideation? Denies  Danger to Others  Danger to Others None reported or observed   Pt in hospital gown with disheveled look. Pt states that she is "weak and feeling helpless." Pt says that she has pain in her hips that is chronic because her hips are "twisted." Pt has anxious and flat affect. Pt still attention-seeking, but walked to the med window on her own. Pt denies SI, HI and AVH.

## 2019-09-14 NOTE — B[facility] Group Notes (Signed)
Type of Therapy/Topic: Identifying Irrational Beliefs/Thoughts  Participation Level: Did Not Attend  Description of Group: The purpose of this group is to assist patients in learning to identify irrational beliefs and thoughts that contribute to their negative emotions and experience positive emotions. Patients will be guided to discuss ways in which they have been effected by irrational thoughts and beliefs and how to transform those irrational beliefs into rational ones. Newly identified rational beliefs will be juxtaposed with experiences of positive emotions or situations, and patients will be challenged to use rational beliefs or thoughts to combat negative ones. Special emphasis will be placed on coping with irrational beliefs in conflict situations, and patients will process healthy conflict resolution skills.  Therapeutic Goals: 1. Patient will identify two irrational thoughts or beliefs  to reflect on in order to balance out those thoughts 2. Patient will label two or more irrational thoughts/beliefs that they find the most difficult to cope with 3. Patient will demonstrate positive conflict resolution skills through discussion and/or role plays that will assist in transforming irrational thoughts or beliefs into positive ones.  Summary of Patient Progress:  Invited, chose not to attend.    Therapeutic Modalities: Cognitive Behavioral Therapy Feelings Identification Dialectical Behavioral Therapy

## 2019-09-14 NOTE — Progress Notes (Signed)
CSW contacted [facility] to determine bed availability for a female bed and to determine if the patient's insurance is in network.   There was no answer, CSW left HIPAA compliant voicemail.   CSW will continue to follow   [name], MSW, [place] Worker [facility]  Phone: [phone]

## 2019-09-14 NOTE — Progress Notes (Signed)
[facility] MD Progress Note  09/14/2019 11:17 AM Jeanette Rose  MRN:  [id number]  Subjective: Jeanette Rose reports, "I'm not doing good at all. I feel like I can't do nothing. I peed on myself last night. That is what what I'm trying to tell you guys that I'm not able to care for myself. I need help. I need someone to take care of me. I need to go to a nursing home. I'm not able to get out of bed today or walk to the bathroom or dayroom. I tried to feed myself breakfast this morning, it was hard doing it. I ate just a little bit. I need help with bathing, feeding myself & using the bathroom. When I was at home, all I was eating were sandwiches only. I was not able to cook or bathe myself. I don't know why no one will believe me that I just can't take care of myself no more".  Objective: Patient is a 63 year old female with a past psychiatric history significant for major depression, generalized anxiety disorder, posttraumatic stress disorder and components of borderline personality disorder who presented to the [facility] emergency department on 09/10/2019 with suicidal ideation. The patient stated that she felt as though "all I think about his darkness in the end". 09-14-19; [name] is seen, chart reviewed. The chart findings discussed with the treatment. She is lying down in her bed in her room. She is verbally responsive, making good eye contact. She remains depressed & anxious. See the subjective reports above. She is alert, has not been out of her room today. She was served breakfast in her room. She says she was able to eat a little bit because she did not have the energy to complete her meal. She is insisting that she is too weak to care for personal hygiene, dress herself, feed herself, get out of bed & or ambulate. She adds that she urinated oh herself last night because she was too weak to get up to go to the bathroom. She continues to ruminate on how weak & helpless she is feeling. She has been  ordered OT consult, will be evaluated today sometime. Jeanette Rose denies any side effects from her medications & yet, does not think her medications are helping her. She denies any SIHI, AVH, delusional thoughts or paranoia. She does not appear to be responding to some internal stimuli. Her Gabitril was increased from 2 mg to 4 mg po bid for anxiety & mood lability. She is in agreement to continue her current plan of care as already in progress.  Principal Problem: MDD (major depressive disorder), recurrent severe, without psychosis ([place])  Diagnosis: Principal Problem:   MDD (major depressive disorder), recurrent severe, without psychosis ([place]) Active Problems:   Benzodiazepine dependence, continuous ([place])  Total Time spent with patient: 25 minutes  Past Psychiatric History: See H&P  Past Medical History:  Past Medical History:  Diagnosis Date  . Acute acalculous cholecystitis s/p lap chole 04/03/2014 04/02/2014  . Allergy   . Anemia   . Anxiety   . Asthma   . Depression   . Fatty liver    noted on CT per [facility] records  . Fibromyalgia   . GERD (gastroesophageal reflux disease)   . Hyperlipidemia associated with type 2 diabetes mellitus ([place])   . Hypertension   . Hypothyroid 2015   Transiently in 2015. Synthroid 85mg was stopped 05/07/2016 w/ tsh nml following.  . Obesity (BMI 30-39.9)   . OSA (obstructive sleep apnea)  non-compliant with CPAP  . Seborrheic dermatitis    on face and scalp, seen at [facility]  . Stroke [facility])    TIA  . Type 2 diabetes mellitus ([place])     Past Surgical History:  Procedure Laterality Date  . BREAST REDUCTION SURGERY  06/2008  . CHOLECYSTECTOMY N/A 04/03/2014   Procedure: LAPAROSCOPIC CHOLECYSTECTOMY ;  Surgeon: Adin Hector, MD;  Location: [facility] ORS;  Service: General;  Laterality: N/A;  . REDUCTION MAMMAPLASTY    . TUBAL LIGATION     Family History:  Family History  Problem Relation Age of Onset  . Atrial fibrillation Mother   . Heart  disease Father        CAD  . Diabetes Brother   . Cancer Maternal Grandmother   . Diabetes Paternal Grandmother   . Fibromyalgia Sister   . Colitis Sister    Family Psychiatric  History: See H&P  Social History:  Social History   Substance and Sexual Activity  Alcohol Use No     Social History   Substance and Sexual Activity  Drug Use No    Social History   Socioeconomic History  . Marital status: Divorced    Spouse name: Not on file  . Number of children: Not on file  . Years of education: Not on file  . Highest education level: Not on file  Occupational History  . Not on file  Tobacco Use  . Smoking status: Former Smoker    Packs/day: 4.00    Years: 15.00    Pack years: 60.00    Types: Cigarettes    Quit date: 04/22/2004    Years since quitting: 15.4  . Smokeless tobacco: Never Used  . Tobacco comment: 4 pack year hx  Substance and Sexual Activity  . Alcohol use: No  . Drug use: No  . Sexual activity: Never  Other Topics Concern  . Not on file  Social History Narrative  . Not on file   Social Determinants of Health   Financial Resource Strain:   . Difficulty of Paying Living Expenses: Not on file  Food Insecurity:   . Worried About Charity fundraiser in the Last Year: Not on file  . Ran Out of Food in the Last Year: Not on file  Transportation Needs:   . Lack of Transportation (Medical): Not on file  . Lack of Transportation (Non-Medical): Not on file  Physical Activity:   . Days of Exercise per Week: Not on file  . Minutes of Exercise per Session: Not on file  Stress:   . Feeling of Stress : Not on file  Social Connections:   . Frequency of Communication with Friends and Family: Not on file  . Frequency of Social Gatherings with Friends and Family: Not on file  . Attends Religious Services: Not on file  . Active Member of Clubs or Organizations: Not on file  . Attends Archivist Meetings: Not on file  . Marital Status: Not on file    Additional Social History:   Sleep: Fair  Appetite:  Good  Current Medications: Current Facility-Administered Medications  Medication Dose Route Frequency Provider Last Rate Last Admin  . acetylcysteine (MUCOMYST) 20 % nebulizer / oral solution 600 mg  600 mg Oral TID Sharma Covert, MD   600 mg at 09/14/19 1002  . aspirin EC tablet 81 mg  81 mg Oral Daily Rankin, Shuvon B, NP   81 mg at 09/14/19 0957  . atorvastatin (  LIPITOR) tablet 80 mg  80 mg Oral QHS Rankin, Shuvon B, NP   80 mg at 09/13/19 2102  . docusate sodium (COLACE) capsule 100 mg  100 mg Oral BID Rankin, Shuvon B, NP   100 mg at 09/14/19 0958  . DULoxetine (CYMBALTA) DR capsule 90 mg  90 mg Oral Daily Sharma Covert, MD   90 mg at 09/14/19 0956  . insulin aspart (novoLOG) injection 0-9 Units  0-9 Units Subcutaneous TID WC Rankin, Shuvon B, NP   1 Units at 09/13/19 1209  . insulin glargine (LANTUS) injection 30 Units  30 Units Subcutaneous q morning - 10a Sharma Covert, MD   30 Units at 09/14/19 [phone]  . irbesartan (AVAPRO) tablet 150 mg  150 mg Oral Daily Rankin, Shuvon B, NP   150 mg at 09/14/19 0957  . LORazepam (ATIVAN) tablet 1 mg  1 mg Oral TID Sharma Covert, MD   1 mg at 09/14/19 0958  . metFORMIN (GLUCOPHAGE-XR) 24 hr tablet 1,000 mg  1,000 mg Oral Q breakfast Rankin, Shuvon B, NP   1,000 mg at 09/13/19 0750  . miconazole (MICOTIN) 2 % cream   Topical BID Rankin, Shuvon B, NP   Given at 09/14/19 1001  . mirtazapine (REMERON) tablet 15 mg  15 mg Oral QHS Sharma Covert, MD   15 mg at 09/13/19 2102  . OLANZapine (ZYPREXA) tablet 2.5 mg  2.5 mg Oral QHS Sharma Covert, MD      . pantoprazole (PROTONIX) EC tablet 40 mg  40 mg Oral BH-q7a Rankin, Shuvon B, NP   40 mg at 09/14/19 4765  . tiaGABine (GABITRIL) tablet 4 mg  4 mg Oral BID Lindell Spar I, NP   4 mg at 09/14/19 4650   Lab Results:  Results for orders placed or performed during the hospital encounter of 09/11/19 (from the past 48 hour(s))   Glucose, capillary     Status: None   Collection Time: 09/12/19 11:58 AM  Result Value Ref Range   Glucose-Capillary 79 70 - 99 mg/dL   Comment 1 Notify RN    Comment 2 Document in Chart   Glucose, capillary     Status: None   Collection Time: 09/12/19  5:03 PM  Result Value Ref Range   Glucose-Capillary 78 70 - 99 mg/dL   Comment 1 Notify RN    Comment 2 Document in Chart   Glucose, capillary     Status: Abnormal   Collection Time: 09/12/19  8:56 PM  Result Value Ref Range   Glucose-Capillary 141 (H) 70 - 99 mg/dL   Comment 1 Notify RN    Comment 2 Document in Chart   Glucose, capillary     Status: None   Collection Time: 09/13/19  5:16 AM  Result Value Ref Range   Glucose-Capillary 94 70 - 99 mg/dL   Comment 1 Notify RN    Comment 2 Document in Chart   Glucose, capillary     Status: Abnormal   Collection Time: 09/13/19 10:14 AM  Result Value Ref Range   Glucose-Capillary 167 (H) 70 - 99 mg/dL   Comment 1 Notify RN   Glucose, capillary     Status: Abnormal   Collection Time: 09/13/19 12:05 PM  Result Value Ref Range   Glucose-Capillary 125 (H) 70 - 99 mg/dL   Comment 1 Notify RN   Glucose, capillary     Status: Abnormal   Collection Time: 09/13/19  4:32 PM  Result Value  Ref Range   Glucose-Capillary 100 (H) 70 - 99 mg/dL  Glucose, capillary     Status: Abnormal   Collection Time: 09/13/19  8:53 PM  Result Value Ref Range   Glucose-Capillary 126 (H) 70 - 99 mg/dL  Glucose, capillary     Status: Abnormal   Collection Time: 09/14/19  6:05 AM  Result Value Ref Range   Glucose-Capillary 34 (LL) 70 - 99 mg/dL   Comment 1 Notify RN   Glucose, capillary     Status: Abnormal   Collection Time: 09/14/19  6:33 AM  Result Value Ref Range   Glucose-Capillary 61 (L) 70 - 99 mg/dL  Glucose, capillary     Status: Abnormal   Collection Time: 09/14/19  6:52 AM  Result Value Ref Range   Glucose-Capillary 121 (H) 70 - 99 mg/dL   Blood Alcohol level:  Lab Results  Component  Value Date   ETH <10 09/10/2019   ETH <10 69/62/9528   Metabolic Disorder Labs: Lab Results  Component Value Date   HGBA1C 9.9 (H) 09/12/2019   MPG 237.43 09/12/2019   MPG 134.11 03/09/2019   No results found for: PROLACTIN Lab Results  Component Value Date   CHOL 174 10/09/2018   TRIG 210 (H) 10/09/2018   HDL 44 10/09/2018   CHOLHDL 4.0 10/09/2018   LDLCALC 88 10/09/2018   LDLCALC 77 06/28/2018   Physical Findings: AIMS: Facial and Oral Movements Muscles of Facial Expression: None, normal Lips and Perioral Area: None, normal Jaw: None, normal Tongue: None, normal,Extremity Movements Upper (arms, wrists, hands, fingers): None, normal Lower (legs, knees, ankles, toes): None, normal, Trunk Movements Neck, shoulders, hips: None, normal, Overall Severity Severity of abnormal movements (highest score from questions above): None, normal Incapacitation due to abnormal movements: None, normal Patient's awareness of abnormal movements (rate only patient's report): No Awareness, Dental Status Current problems with teeth and/or dentures?: No Does patient usually wear dentures?: No  CIWA:  CIWA-Ar Total: 1 COWS:  COWS Total Score: 2  Musculoskeletal: Strength & Muscle Tone: within normal limits Gait & Station: normal Patient leans: N/A  Psychiatric Specialty Exam: Physical Exam  Nursing note and vitals reviewed. Constitutional: She is oriented to person, place, and time. She appears well-developed.  Cardiovascular:  Elevated pulse rate: 115  Respiratory: Effort normal.  Genitourinary:    Genitourinary Comments: Deferred   Musculoskeletal:        General: Normal range of motion.     Cervical back: Normal range of motion.  Neurological: She is alert and oriented to person, place, and time.  Skin: Skin is warm and dry.    Review of Systems  Constitutional: Negative for chills and fever.  HENT: Negative for congestion and sore throat.   Respiratory: Negative for cough,  shortness of breath and wheezing.   Cardiovascular: Negative for chest pain and palpitations.  Gastrointestinal: Negative for diarrhea, nausea and vomiting.  Skin: Negative for color change.  Psychiatric/Behavioral: Positive for decreased concentration and dysphoric mood. Negative for agitation, behavioral problems, confusion, hallucinations, self-injury, sleep disturbance and suicidal ideas. The patient is nervous/anxious. The patient is not hyperactive.     Blood pressure 101/62, pulse (!) 115, temperature 97.7 F (36.5 C), temperature source Oral, resp. rate 20, height 5' 5.5" (1.664 m), weight 77.6 kg, SpO2 99 %.Body mass index is 28.02 kg/m.  General Appearance: Disheveled  Eye Contact:  Fair  Speech:  Clear and Coherent and Normal Rate  Volume:  Normal  Mood:  Anxious, Depressed and Dysphoric  Affect:  Flat  Thought Process:  Coherent and Descriptions of Associations: Intact  Orientation:  Full (Time, Place, and Person)  Thought Content:  Rumination  Suicidal Thoughts:  Denies any thoughts, plans or intent  Homicidal Thoughts:  Denies  Memory:  Immediate;   Fair Recent;   Fair Remote;   Fair  Judgement:  Intact  Insight:  Lacking  Psychomotor Activity:  Decreased  Concentration:  Concentration: Fair and Attention Span: Fair  Recall:  Good  Fund of Knowledge:  Fair  Language:  Good  Akathisia:  Negative  Handed:  Right  AIMS (if indicated):     Assets:  Communication Skills Desire for Improvement Social Support  ADL's:  Intact  Cognition:  WNL  Sleep:  Number of Hours: 4.25   Treatment Plan Summary: Daily contact with patient to assess and evaluate symptoms and progress in treatment and Medication management.  -Continue inpatient hospitalization.  -Will continue today 09/14/2019 plan as below except where it is noted.  Depression/mood control             -Continue Olanzapine 2.5 mg po Q hs.   -Continue Cymbalta 90 mg po daily.             -Continue Mirtazapine  15 mg po Q hs for depression/anxiety.  Mood lability.              -Discontinued Gabitril 4 mg po bid for anxiety as well, patient is not benefiting.  -Anxiety  -Continue Lorazepam 1 mg po tid.             -Re-initiated Buspar 10 mg po tid.  -Insomnia  -Continue Mirtazapine 15 mg po Q hs also for depression.  -Other medical issues.  -Continue lisinopril Metformin XR 1,000 mg po daily for DM.             -Continue Protonix 40 mg po Q am.             -Continue AVAPRO 150 mg po qd for HTN.             -Continue Lantus insulin 72 units Q morning for DM.             -Continue the sliding scale Novolog insulin per BS result.             -Continue Colace 100 mg po bid for constipation.             -Continue Lipitor 80 mg po Q evenings for high cholesterol.             -Asa 81 mg po daily for heart health.             -Mucomyst 20% nebulizer/oral 600 mg  Po tid.              -Continue Micotin 2%, apply topically, apply to vaginal area bid.   -Encourage participation in groups and therapeutic milieu  -Disposition planning will be ongoing  Lindell Spar, NP, PMHNP, FNP-BC 09/14/2019, 11:17 AMPatient ID: Jeanette Rose, female   DOB: 1956/01/26, 63 y.o.   MRN: [id number]

## 2019-09-14 NOTE — Progress Notes (Signed)
Recreation Therapy Notes  Date:  12.18.20 Time: 0930 Location: [address] Hall Dayroom  Group Topic: Stress Management  Goal Area(s) Addresses:  Patient will identify positive stress management techniques. Patient will identify benefits of using stress management post d/c.  Intervention: Stress Management  Activity :  Meditation.  LRT played a meditation that focused on making the most of your day.  Patients were to listen as meditation played to follow along.  Education:  Stress Management, Discharge Planning.   Education Outcome: Acknowledges Education  Clinical Observations/Feedback: Pt did not attend group session.    Victorino Sparrow, LRT/CTRS    Victorino Sparrow A 09/14/2019 11:47 AM

## 2019-09-14 NOTE — [facility] Suicide Risk Assessment (Signed)
[place] INPATIENT:  Family/Significant Other Suicide Prevention Education  Suicide Prevention Education:  Education Completed; with sister, [name] [phone]) has been identified by the patient as the family member/significant other with whom the patient will be residing, and identified as the person(s) who will aid the patient in the event of a mental health crisis (suicidal ideations/suicide attempt).  With written consent from the patient, the family member/significant other has been provided the following suicide prevention education, prior to the and/or following the discharge of the patient.  The suicide prevention education provided includes the following:  Suicide risk factors  Suicide prevention and interventions  National Suicide Hotline telephone number  [facility] assessment telephone number  [facility] Emergency Assistance [place] and/or Residential Mobile Crisis Unit telephone number  Request made of family/significant other to:  Remove weapons (e.g., guns, rifles, knives), all items previously/currently identified as safety concern.    Remove drugs/medications (over-the-counter, prescriptions, illicit drugs), all items previously/currently identified as a safety concern.  The family member/significant other verbalizes understanding of the suicide prevention education information provided.  The family member/significant other agrees to remove the items of safety concern listed above.   Jeanette Rose reports great concern for her sister. She reports that the patient was suppose to move into her new trailer tomorrow, however the patient cannot do so due to her current hospitalization. Jeanette Rose reports she does not believe the patient can live on her own at this time, and does not know of any alternative housing arrangements at this time.  Jeanette Rose inquired about possible group home placements, long term treatment facilities and home health agencies  as suggestions for the patient's discharge plan. CSW shared with Jeanette Rose that all placement considerations will be followed up on and once an update was available, CSW would share that information with her. Jeanette Rose expressed understanding and thank CSW for the help.   CSW will continue to follow   Marylee Floras 09/14/2019, 11:03 AM

## 2019-09-14 NOTE — Progress Notes (Addendum)
Hypoglycemic Event  CBG: 34  Treatment: 8 oz juice/soda, graham crackers, 2 oz peanut butter  Symptoms: None  Follow-up CBG: [id number] CBG Result:61 Follow-up CBG: SFKC:1275 CBG Result:121  Possible Reasons for Event: Unknown  Comments/MD notified:provider notified; continue to monitor    Lajoyce Corners

## 2019-09-15 LAB — URINALYSIS, COMPLETE (UACMP) WITH MICROSCOPIC
Bacteria, UA: NONE SEEN
Bilirubin Urine: NEGATIVE
Glucose, UA: >=500 mg/dL — AB
Hgb urine dipstick: NEGATIVE
Ketones, ur: NEGATIVE mg/dL
Leukocytes,Ua: NEGATIVE
Nitrite: NEGATIVE
Protein, ur: NEGATIVE mg/dL
Specific Gravity, Urine: 1.016 (ref 1.005–1.030)
pH: 5 (ref 5.0–8.0)

## 2019-09-15 LAB — GLUCOSE, CAPILLARY
Glucose-Capillary: 113 mg/dL — ABNORMAL HIGH (ref 70–99)
Glucose-Capillary: 140 mg/dL — ABNORMAL HIGH (ref 70–99)
Glucose-Capillary: 198 mg/dL — ABNORMAL HIGH (ref 70–99)

## 2019-09-15 NOTE — Progress Notes (Signed)
Patient ID: Jeanette Rose, female   DOB: May 03, 1956, 63 y.o.   MRN: [id number]    [place] NOVEL CORONAVIRUS (COVID-19) DAILY CHECK-OFF SYMPTOMS - answer yes or no to each - every day NO YES  Have you had a fever in the past 24 hours?  . Fever (Temp > 37.80C / 100F) X   Have you had any of these symptoms in the past 24 hours? . New Cough .  Sore Throat  .  Shortness of Breath .  Difficulty Breathing .  Unexplained Body Aches   X   Have you had any one of these symptoms in the past 24 hours not related to allergies?   . Runny Nose .  Nasal Congestion .  Sneezing   X   If you have had runny nose, nasal congestion, sneezing in the past 24 hours, has it worsened?  X   EXPOSURES - check yes or no X   Have you traveled outside the state in the past 14 days?  X   Have you been in contact with someone with a confirmed diagnosis of COVID-19 or PUI in the past 14 days without wearing appropriate PPE?  X   Have you been living in the same home as a person with confirmed diagnosis of COVID-19 or a PUI (household contact)?    X   Have you been diagnosed with COVID-19?    X              What to do next: Answered NO to all: Answered YES to anything:   Proceed with unit schedule Follow the [facility] Inpatient Flowsheet.

## 2019-09-15 NOTE — Progress Notes (Signed)
Received order for Mucinex DM 12 hr, PRN BID for pt's cough. Pt given medication. Will continue to monitor.

## 2019-09-15 NOTE — Progress Notes (Signed)
[place] Group Notes:  (Nursing/MHT/Case Management/Adjunct)  Date:  09/15/2019  Time:  1345  Type of Therapy:  Nurse Education  Discussed goals for hospitalization   Participation Level:  Did Not Attend

## 2019-09-15 NOTE — Plan of Care (Signed)
Pt still attention-seeking but staying firm with boundaries and getting patient to perform as many care activities for herself.   Problem: Self-Concept: Goal: Level of anxiety will decrease Outcome: Progressing   Problem: Education: Goal: Knowledge of the prescribed therapeutic regimen will improve Outcome: Progressing   Problem: Activity: Goal: Imbalance in normal sleep/wake cycle will improve Outcome: Progressing   Problem: Coping: Goal: Will verbalize feelings Outcome: Progressing   Problem: Safety: Goal: Ability to disclose and discuss suicidal ideas will improve Outcome: Progressing   Problem: Activity: Goal: Sleeping patterns will improve Outcome: Progressing

## 2019-09-15 NOTE — [facility] Group Notes (Signed)
Pt did not attend wrap up group this evening. Pt was in bed sleeping.

## 2019-09-15 NOTE — Plan of Care (Signed)
  Problem: Health Behavior/Discharge Planning: Goal: Compliance with therapeutic regimen will improve Outcome: Progressing   Problem: Safety: Goal: Periods of time without injury will increase Outcome: Progressing   Problem: Activity: Goal: Interest or engagement in leisure activities will improve Outcome: Not Progressing

## 2019-09-15 NOTE — Progress Notes (Signed)
[facility] MD Progress Note  09/15/2019 12:10 PM Jeanette Rose  MRN:  [id number]  Subjective: Jeanette Rose reports, "I feel like my brain can't think about nothing. I feel overwhelmed, like I have lost my mind. My depression must be pretty bad today, I guess. My anxiety is worst. I feel very anxious today. I can't do anything for myself any more"  Objective: Patient is a 63 year old female with a past psychiatric history significant for major depression, generalized anxiety disorder, posttraumatic stress disorder and components of borderline personality disorder who presented to the [facility] emergency department on 09/10/2019 with suicidal ideation. The patient stated that she felt as though "all I think about his darkness in the end". 09-15-19; Jeanette Rose is seen, chart reviewed. The chart findings discussed with the treatment. She is lying down in her bed in her room. She is verbally responsive, making fair eye contact. She presents very tearful. She remains depressed & anxious. See the subjective reports above. She is alert, has been out of her room today to the cafeteria to obtain her breakfast, fed herself, took a shower with a lot of encouragement from staff. She says she was able to eat a little bit of her breakfast because she did not have the energy to feed herself. She continues to ruminate on how weak & helpless she is feeling. Jeanette Rose denies any side effects from her medications & yet, does not think her medications are helping her. She denies any SIHI, AVH, delusional thoughts or paranoia. She does not appear to be responding to some internal stimuli. Her Gabitril was discontinued yesterday as it seems she is not benefiting from taking it. Her Buspar was re-instated yesterday for her nagging anxiety issues. She continues to require & receive encouragement & support from staff. She is in agreement to continue her current plan of care as already in progress.  Principal Problem: MDD (major  depressive disorder), recurrent severe, without psychosis ([place])  Diagnosis: Principal Problem:   MDD (major depressive disorder), recurrent severe, without psychosis ([place]) Active Problems:   Benzodiazepine dependence, continuous ([place])  Total Time spent with patient: 15 minutes  Past Psychiatric History: See H&P  Past Medical History:  Past Medical History:  Diagnosis Date  . Acute acalculous cholecystitis s/p lap chole 04/03/2014 04/02/2014  . Allergy   . Anemia   . Anxiety   . Asthma   . Depression   . Fatty liver    noted on CT per [facility] records  . Fibromyalgia   . GERD (gastroesophageal reflux disease)   . Hyperlipidemia associated with type 2 diabetes mellitus ([place])   . Hypertension   . Hypothyroid 2015   Transiently in 2015. Synthroid 32mg was stopped 05/07/2016 w/ tsh nml following.  . Obesity (BMI 30-39.9)   . OSA (obstructive sleep apnea)    non-compliant with CPAP  . Seborrheic dermatitis    [facility] face and scalp[facility]  . Stroke ([facility]    TIA  . Type 2 diabetes mellitus ([place]     Past Surgical History:  Procedure Laterality Date  . BREAST REDUCTION SURGERY  06/2008  . CHOLECYSTECTOMY N/A 04/03/2014   Procedure: LAPAROSCOPIC CHOLECYSTECTOMY ;  Surgeon: SAdin Hector MD;  Location: [facility] ORS;  Service: General;  Laterality: N/A;  . REDUCTION MAMMAPLASTY    . TUBAL LIGATION     Family History:  Family History  Problem Relation Age of Onset  . Atrial fibrillation Mother   . Heart disease Father  CAD  . Diabetes Brother   . Cancer Maternal Grandmother   . Diabetes Paternal Grandmother   . Fibromyalgia Sister   . Colitis Sister    Family Psychiatric  History: See H&P  Social History:  Social History   Substance and Sexual Activity  Alcohol Use No     Social History   Substance and Sexual Activity  Drug Use No    Social History   Socioeconomic History  . Marital status: Divorced    Spouse name: Not on file  . Number of  children: Not on file  . Years of education: Not on file  . Highest education level: Not on file  Occupational History  . Not on file  Tobacco Use  . Smoking status: Former Smoker    Packs/day: 4.00    Years: 15.00    Pack years: 60.00    Types: Cigarettes    Quit date: 04/22/2004    Years since quitting: 15.4  . Smokeless tobacco: Never Used  . Tobacco comment: 4 pack year hx  Substance and Sexual Activity  . Alcohol use: No  . Drug use: No  . Sexual activity: Never  Other Topics Concern  . Not on file  Social History Narrative  . Not on file   Social Determinants of Health   Financial Resource Strain:   . Difficulty of Paying Living Expenses: Not on file  Food Insecurity:   . Worried About Charity fundraiser in the Last Year: Not on file  . Ran Out of Food in the Last Year: Not on file  Transportation Needs:   . Lack of Transportation (Medical): Not on file  . Lack of Transportation (Non-Medical): Not on file  Physical Activity:   . Days of Exercise per Week: Not on file  . Minutes of Exercise per Session: Not on file  Stress:   . Feeling of Stress : Not on file  Social Connections:   . Frequency of Communication with Friends and Family: Not on file  . Frequency of Social Gatherings with Friends and Family: Not on file  . Attends Religious Services: Not on file  . Active Member of Clubs or Organizations: Not on file  . Attends Archivist Meetings: Not on file  . Marital Status: Not on file   Additional Social History:   Sleep: Fair  Appetite:  Good  Current Medications: Current Facility-Administered Medications  Medication Dose Route Frequency Provider Last Rate Last Admin  . acetylcysteine (MUCOMYST) 20 % nebulizer / oral solution 600 mg  600 mg Oral TID Sharma Covert, MD   600 mg at 09/15/19 1138  . aspirin EC tablet 81 mg  81 mg Oral Daily Rankin, Shuvon B, NP   81 mg at 09/15/19 0804  . atorvastatin (LIPITOR) tablet 80 mg  80 mg Oral QHS  Rankin, Shuvon B, NP   80 mg at 09/14/19 2139  . busPIRone (BUSPAR) tablet 10 mg  10 mg Oral TID Lindell Spar I, NP   10 mg at 09/15/19 1137  . dextromethorphan-guaiFENesin (MUCINEX DM) 30-600 MG per 12 hr tablet 1 tablet  1 tablet Oral BID PRN Rozetta Nunnery, NP   1 tablet at 09/15/19 0003  . docusate sodium (COLACE) capsule 100 mg  100 mg Oral BID Rankin, Shuvon B, NP   100 mg at 09/15/19 0805  . DULoxetine (CYMBALTA) DR capsule 60 mg  60 mg Oral BID Sharma Covert, MD   60 mg at 09/15/19 0804  .  insulin aspart (novoLOG) injection 0-9 Units  0-9 Units Subcutaneous TID WC Rankin, Shuvon B, NP   2 Units at 09/14/19 1704  . insulin glargine (LANTUS) injection 30 Units  30 Units Subcutaneous q morning - 10a Sharma Covert, MD   30 Units at 09/15/19 1105  . irbesartan (AVAPRO) tablet 150 mg  150 mg Oral Daily Rankin, Shuvon B, NP   150 mg at 09/15/19 0804  . LORazepam (ATIVAN) tablet 1 mg  1 mg Oral TID Sharma Covert, MD   1 mg at 09/15/19 1137  . metFORMIN (GLUCOPHAGE-XR) 24 hr tablet 1,000 mg  1,000 mg Oral Q breakfast Rankin, Shuvon B, NP   1,000 mg at 09/15/19 0803  . miconazole (MICOTIN) 2 % cream   Topical BID Rankin, Shuvon B, NP   Given at 09/14/19 1001  . mirtazapine (REMERON) tablet 30 mg  30 mg Oral QHS Sharma Covert, MD   30 mg at 09/14/19 2139  . OLANZapine (ZYPREXA) tablet 2.5 mg  2.5 mg Oral QHS Sharma Covert, MD   2.5 mg at 09/14/19 2139  . pantoprazole (PROTONIX) EC tablet 40 mg  40 mg Oral BH-q7a Rankin, Shuvon B, NP   40 mg at 09/15/19 0630   Lab Results:  Results for orders placed or performed during the hospital encounter of 09/11/19 (from the past 48 hour(s))  Glucose, capillary     Status: Abnormal   Collection Time: 09/13/19  4:32 PM  Result Value Ref Range   Glucose-Capillary 100 (H) 70 - 99 mg/dL  Glucose, capillary     Status: Abnormal   Collection Time: 09/13/19  8:53 PM  Result Value Ref Range   Glucose-Capillary 126 (H) 70 - 99 mg/dL   Glucose, capillary     Status: Abnormal   Collection Time: 09/14/19  6:05 AM  Result Value Ref Range   Glucose-Capillary 34 (LL) 70 - 99 mg/dL   Comment 1 Notify RN   Glucose, capillary     Status: Abnormal   Collection Time: 09/14/19  6:33 AM  Result Value Ref Range   Glucose-Capillary 61 (L) 70 - 99 mg/dL  Glucose, capillary     Status: Abnormal   Collection Time: 09/14/19  6:52 AM  Result Value Ref Range   Glucose-Capillary 121 (H) 70 - 99 mg/dL  Glucose, capillary     Status: Abnormal   Collection Time: 09/14/19 11:59 AM  Result Value Ref Range   Glucose-Capillary 133 (H) 70 - 99 mg/dL  Urinalysis, Complete w Microscopic     Status: Abnormal   Collection Time: 09/14/19 12:30 PM  Result Value Ref Range   Color, Urine YELLOW YELLOW   APPearance CLEAR CLEAR   Specific Gravity, Urine 1.016 1.005 - 1.030   pH 5.0 5.0 - 8.0   Glucose, UA >=500 (A) NEGATIVE mg/dL   Hgb urine dipstick NEGATIVE NEGATIVE   Bilirubin Urine NEGATIVE NEGATIVE   Ketones, ur NEGATIVE NEGATIVE mg/dL   Protein, ur NEGATIVE NEGATIVE mg/dL   Nitrite NEGATIVE NEGATIVE   Leukocytes,Ua NEGATIVE NEGATIVE   RBC / HPF 0-5 0 - 5 RBC/hpf   WBC, UA 0-5 0 - 5 WBC/hpf   Bacteria, UA NONE SEEN NONE SEEN   Squamous Epithelial / LPF 0-5 0 - 5   Mucus PRESENT     Comment: Performed at [facility], [place] [address], [place], [place] [address]  Glucose, capillary     Status: Abnormal   Collection Time: 09/14/19  5:01 PM  Result Value Ref Range   Glucose-Capillary 186 (H) 70 - 99 mg/dL  CBC with Differential/Platelet     Status: None   Collection Time: 09/14/19  6:47 PM  Result Value Ref Range   WBC 8.1 4.0 - 10.5 K/uL   RBC 4.18 3.87 - 5.11 MIL/uL   Hemoglobin 12.4 12.0 - 15.0 g/dL   HCT 37.9 36.0 - 46.0 %   MCV 90.7 80.0 - 100.0 fL   MCH 29.7 26.0 - 34.0 pg   MCHC 32.7 30.0 - 36.0 g/dL   RDW 14.5 11.5 - 15.5 %   Platelets 209 150 - 400 K/uL   nRBC 0.0 0.0 - 0.2 %   Neutrophils Relative % 54  %   Neutro Abs 4.5 1.7 - 7.7 K/uL   Lymphocytes Relative 35 %   Lymphs Abs 2.8 0.7 - 4.0 K/uL   Monocytes Relative 6 %   Monocytes Absolute 0.5 0.1 - 1.0 K/uL   Eosinophils Relative 4 %   Eosinophils Absolute 0.3 0.0 - 0.5 K/uL   Basophils Relative 1 %   Basophils Absolute 0.1 0.0 - 0.1 K/uL   Immature Granulocytes 0 %   Abs Immature Granulocytes 0.03 0.00 - 0.07 K/uL    Comment: Performed at [facility], [place] [address], [place], [place] [address]  Comprehensive metabolic panel     Status: Abnormal   Collection Time: 09/14/19  6:47 PM  Result Value Ref Range   Sodium 135 135 - 145 mmol/L   Potassium 4.4 3.5 - 5.1 mmol/L   Chloride 101 98 - 111 mmol/L   CO2 24 22 - 32 mmol/L   Glucose, Bld 241 (H) 70 - 99 mg/dL   BUN 19 8 - 23 mg/dL   Creatinine, Ser 0.82 0.44 - 1.00 mg/dL   Calcium 9.4 8.9 - 10.3 mg/dL   Total Protein 6.8 6.5 - 8.1 g/dL   Albumin 3.6 3.5 - 5.0 g/dL   AST 26 15 - 41 U/L   ALT 33 0 - 44 U/L   Alkaline Phosphatase 95 38 - 126 U/L   Total Bilirubin 0.4 0.3 - 1.2 mg/dL   GFR calc non Af Amer >60 >60 mL/min   GFR calc Af Amer >60 >60 mL/min   Anion gap 10 5 - 15    Comment: Performed at [facility], [place] [address] [place], [place] [address]  Glucose, capillary     Status: Abnormal   Collection Time: 09/14/19  9:02 PM  Result Value Ref Range   Glucose-Capillary 178 (H) 70 - 99 mg/dL  Glucose, capillary     Status: Abnormal   Collection Time: 09/15/19  6:32 AM  Result Value Ref Range   Glucose-Capillary 113 (H) 70 - 99 mg/dL   Comment 1 Notify RN    Comment 2 Document in Chart    Blood Alcohol level:  Lab Results  Component Value Date   ETH <10 09/10/2019   ETH <10 11/91/4782   Metabolic Disorder Labs: Lab Results  Component Value Date   HGBA1C 9.9 (H) 09/12/2019   MPG 237.43 09/12/2019   MPG 134.11 03/09/2019   No results found for: PROLACTIN Lab Results  Component Value Date   CHOL 174 10/09/2018   TRIG  210 (H) 10/09/2018   HDL 44 10/09/2018   CHOLHDL 4.0 10/09/2018   LDLCALC 88 10/09/2018   LDLCALC 77 06/28/2018   Physical Findings: AIMS: Facial and Oral Movements Muscles of Facial Expression: None, normal Lips and Perioral  Area: None, normal Jaw: None, normal Tongue: None, normal,Extremity Movements Upper (arms, wrists, hands, fingers): None, normal Lower (legs, knees, ankles, toes): None, normal, Trunk Movements Neck, shoulders, hips: None, normal, Overall Severity Severity of abnormal movements (highest score from questions above): None, normal Incapacitation due to abnormal movements: None, normal Patient's awareness of abnormal movements (rate only patient's report): No Awareness, Dental Status Current problems with teeth and/or dentures?: No Does patient usually wear dentures?: No  CIWA:  CIWA-Ar Total: 1 COWS:  COWS Total Score: 2  Musculoskeletal: Strength & Muscle Tone: within normal limits Gait & Station: normal Patient leans: N/A  Psychiatric Specialty Exam: Physical Exam  Nursing note and vitals reviewed. Constitutional: She is oriented to person, place, and time. She appears well-developed.  Cardiovascular:  Elevated pulse rate: 115  Respiratory: Effort normal.  Genitourinary:    Genitourinary Comments: Deferred   Musculoskeletal:        General: Normal range of motion.     Cervical back: Normal range of motion.  Neurological: She is alert and oriented to person, place, and time.  Skin: Skin is warm and dry.    Review of Systems  Constitutional: Negative for chills and fever.  HENT: Negative for congestion and sore throat.   Respiratory: Negative for cough, shortness of breath and wheezing.   Cardiovascular: Negative for chest pain and palpitations.  Gastrointestinal: Negative for diarrhea, nausea and vomiting.  Skin: Negative for color change.  Psychiatric/Behavioral: Positive for decreased concentration and dysphoric mood. Negative for agitation,  behavioral problems, confusion, hallucinations, self-injury, sleep disturbance and suicidal ideas. The patient is nervous/anxious. The patient is not hyperactive.     Blood pressure 116/67, pulse (!) 113, temperature 98.5 F (36.9 C), temperature source Oral, resp. rate 20, height 5' 5.5" (1.664 m), weight 77.6 kg, SpO2 99 %.Body mass index is 28.02 kg/m.  General Appearance: Disheveled  Eye Contact:  Fair  Speech:  Clear and Coherent and Normal Rate  Volume:  Normal  Mood:  Anxious, Depressed and Dysphoric  Affect:  Flat  Thought Process:  Coherent and Descriptions of Associations: Intact  Orientation:  Full (Time, Place, and Person)  Thought Content:  Rumination  Suicidal Thoughts:  Denies any thoughts, plans or intent  Homicidal Thoughts:  Denies  Memory:  Immediate;   Fair Recent;   Fair Remote;   Fair  Judgement:  Intact  Insight:  Lacking  Psychomotor Activity:  Decreased  Concentration:  Concentration: Fair and Attention Span: Fair  Recall:  Good  Fund of Knowledge:  Fair  Language:  Good  Akathisia:  Negative  Handed:  Right  AIMS (if indicated):     Assets:  Communication Skills Desire for Improvement Social Support  ADL's:  Intact  Cognition:  WNL  Sleep:  Number of Hours: 6   Treatment Plan Summary: Daily contact with patient to assess and evaluate symptoms and progress in treatment and Medication management.  -Continue inpatient hospitalization.  -Will continue today 09/15/2019 plan as below except where it is noted.  Depression/mood control             -Continue Olanzapine 2.5 mg po Q hs.   -Continue Cymbalta 90 mg po daily.             -Continue Mirtazapine 15 mg po Q hs for depression/anxiety.  Mood lability.              -Discontinued Gabitril 4 mg po bid for anxiety as well, patient is not benefiting.  -Anxiety  -  Continue Lorazepam 1 mg po tid.             -Re-initiated Buspar 10 mg po tid.  -Insomnia  -Continue Mirtazapine 15 mg po Q hs also  for depression.  -Other medical issues.  -Continue lisinopril Metformin XR 1,000 mg po daily for DM.             -Continue Protonix 40 mg po Q am.             -Continue AVAPRO 150 mg po qd for HTN.             -Continue Lantus insulin 72 units Q morning for DM.             -Continue the sliding scale Novolog insulin per BS result.             -Continue Colace 100 mg po bid for constipation.             -Continue Lipitor 80 mg po Q evenings for high cholesterol.             -Asa 81 mg po daily for heart health.             -Mucomyst 20% nebulizer/oral 600 mg  Po tid.              -Continue Micotin 2%, apply topically, apply to vaginal area bid.   -Encourage participation in groups and therapeutic milieu  -Disposition planning will be ongoing  Lindell Spar, NP, PMHNP, FNP-BC 09/15/2019, 12:10 PMPatient ID: [name], female   DOB: [birth year], 63 y.o.   MRN: [id number] Patient ID: Kerissa Coia, female   DOB: Jun 12, 1956, 63 y.o.   MRN: [id number]

## 2019-09-16 ENCOUNTER — Encounter (HOSPITAL_COMMUNITY): Payer: Self-pay | Admitting: Registered Nurse

## 2019-09-16 ENCOUNTER — Emergency Department (HOSPITAL_COMMUNITY)
Admission: EM | Admit: 2019-09-16 | Discharge: 2019-09-16 | Discharge status: Absent without leave | Payer: Medicare Other

## 2019-09-16 ENCOUNTER — Emergency Department (HOSPITAL_COMMUNITY): Payer: Medicare Other

## 2019-09-16 DIAGNOSIS — F341 Dysthymic disorder: Secondary | ICD-10-CM

## 2019-09-16 LAB — GLUCOSE, CAPILLARY
Glucose-Capillary: 146 mg/dL — ABNORMAL HIGH (ref 70–99)
Glucose-Capillary: 141 mg/dL — ABNORMAL HIGH (ref 70–99)
Glucose-Capillary: 138 mg/dL — ABNORMAL HIGH (ref 70–99)
Glucose-Capillary: 141 mg/dL — ABNORMAL HIGH (ref 70–99)
Glucose-Capillary: 177 mg/dL — ABNORMAL HIGH (ref 70–99)

## 2019-09-16 MED ORDER — CEPHALEXIN 500 MG PO CAPS: 500.0000 mg | ORAL_CAPSULE | Freq: Two times a day (BID) | ORAL | 0 refills | Status: AC

## 2019-09-16 MED ORDER — LIDOCAINE HCL (PF) 1 % IJ SOLN
10.0000 mL | Freq: Once | INTRAMUSCULAR | Status: DC
  Filled 2019-09-16: qty 30

## 2019-09-16 MED ORDER — CEPHALEXIN 500 MG PO CAPS
500.0000 mg | ORAL_CAPSULE | Freq: Two times a day (BID) | ORAL | Status: AC
  Administered 2019-09-16 – 2019-09-23 (×14): 500 mg via ORAL
  Filled 2019-09-16: qty 2
  Filled 2019-09-16 (×9): qty 1
  Filled 2019-09-16: qty 2
  Filled 2019-09-16 (×5): qty 1

## 2019-09-16 MED ORDER — ACETAMINOPHEN 325 MG PO TABS
650.0000 mg | ORAL_TABLET | Freq: Once | ORAL | Status: AC
  Administered 2019-09-16: 14:00:00 650 mg via ORAL
  Filled 2019-09-16: qty 2

## 2019-09-16 MED ORDER — CEPHALEXIN 500 MG PO CAPS
500.0000 mg | ORAL_CAPSULE | Freq: Once | ORAL | Status: AC
  Administered 2019-09-16: 500 mg via ORAL
  Filled 2019-09-16: qty 1

## 2019-09-16 NOTE — ED Notes (Signed)
Pt given applesauce and juice at this time. MHT at bedside.

## 2019-09-16 NOTE — ED Triage Notes (Signed)
Pt arrives from [facility], C/C colored pencil protruding from her R forearm. Patient reports turning around and not seeing small round table, she tripped over it, hit her knees and then got a pencil stuck in her arm.

## 2019-09-16 NOTE — Progress Notes (Signed)
Patient ID: Jeanette Rose, female   DOB: 08/06/1956, 63 y.o.   MRN: [id number]  [place] NOVEL CORONAVIRUS (COVID-19) DAILY CHECK-OFF SYMPTOMS - answer yes or no to each - every day NO YES  Have you had a fever in the past 24 hours?  . Fever (Temp > 37.80C / 100F) X   Have you had any of these symptoms in the past 24 hours? . New Cough .  Sore Throat  .  Shortness of Breath .  Difficulty Breathing .  Unexplained Body Aches   X   Have you had any one of these symptoms in the past 24 hours not related to allergies?   . Runny Nose .  Nasal Congestion .  Sneezing   X   If you have had runny nose, nasal congestion, sneezing in the past 24 hours, has it worsened?  X   EXPOSURES - check yes or no X   Have you traveled outside the state in the past 14 days?  X   Have you been in contact with someone with a confirmed diagnosis of COVID-19 or PUI in the past 14 days without wearing appropriate PPE?  X   Have you been living in the same home as a person with confirmed diagnosis of COVID-19 or a PUI (household contact)?    X   Have you been diagnosed with COVID-19?    X              What to do next: Answered NO to all: Answered YES to anything:   Proceed with unit schedule Follow the [facility] Inpatient Flowsheet.

## 2019-09-16 NOTE — Progress Notes (Signed)
[facility] MD Progress Note  09/16/2019 11:00 AM Jeanette Rose  MRN:  [id number]  Subjective: Jeanette Rose reports, "I feel the same I was feeling yesterday. I slept well last night but my body is still weak. I have gotten out of bed this morning & walked twice up & down the hall-way. But, I'm still feeling like I can't do anything, like my body has shutdown on me".  Objective: Patient is a 63 year old female with a past psychiatric history significant for major depression, generalized anxiety disorder, posttraumatic stress disorder and components of borderline personality disorder who presented to the [facility] emergency department on 09/10/2019 with suicidal ideation. The patient stated that she felt as though "all I think about his darkness in the end". 09-16-19; Jeanette Rose is seen, chart reviewed. The chart findings discussed with the treatment team. She is lying down in her bed in her room. She is verbally responsive, making fair eye contact. She presents better refreshed today. She remains depressed & anxious, not acknowledging the simple efforts she is making. She is see this morning ambulating up & down the hall-way x 3. She declines to attend the ongoing morning group sessions this morning. She is alert. She says she was able to eat a little bit of her breakfast this morning because she did not have the energy to feed herself. She continues to ruminate on how weak & helpless she is feeling. Azhar denies any side effects from her medications & yet, does not think her medications are helping her. She denies any SIHI, AVH, delusional thoughts or paranoia. She does not appear to be responding to some internal stimuli. Her Gabitril was discontinued yesterday as it seems she is not benefiting from taking it.  She continues to require & receive encouragement & support from staff. She is in agreement to continue her current plan of care as already in progress.  Principal Problem: MDD (major depressive  disorder), recurrent severe, without psychosis ([place])  Diagnosis: Principal Problem:   MDD (major depressive disorder), recurrent severe, without psychosis ([place]) Active Problems:   Benzodiazepine dependence, continuous ([place])  Total Time spent with patient: 15 minutes  Past Psychiatric History: See H&P  Past Medical History:  Past Medical History:  Diagnosis Date  . Acute acalculous cholecystitis s/p lap chole 04/03/2014 04/02/2014  . Allergy   . Anemia   . Anxiety   . Asthma   . Depression   . Fatty liver    noted on CT per [facility] records  . Fibromyalgia   . GERD (gastroesophageal reflux disease)   . Hyperlipidemia associated with type 2 diabetes mellitus ([place])   . Hypertension   . Hypothyroid 2015   Transiently in 2015. Synthroid 48mg was stopped 05/07/2016 w/ tsh nml following.  . Obesity (BMI 30-39.9)   . OSA (obstructive sleep apnea)    non-compliant with CPAP  . Seborrheic dermatitis    [facility]  . Stroke ([facility]    TIA  . Type 2 diabetes mellitus ([place]     Past Surgical History:  Procedure Laterality Date  . BREAST REDUCTION SURGERY  06/2008  . CHOLECYSTECTOMY N/A 04/03/2014   Procedure: LAPAROSCOPIC CHOLECYSTECTOMY ;  Surgeon: SAdin Hector MD;  Location: [facility] ORS;  Service: General;  Laterality: N/A;  . REDUCTION MAMMAPLASTY    . TUBAL LIGATION     Family History:  Family History  Problem Relation Age of Onset  . Atrial fibrillation Mother   . Heart disease Father  CAD  . Diabetes Brother   . Cancer Maternal Grandmother   . Diabetes Paternal Grandmother   . Fibromyalgia Sister   . Colitis Sister    Family Psychiatric  History: See H&P  Social History:  Social History   Substance and Sexual Activity  Alcohol Use No     Social History   Substance and Sexual Activity  Drug Use No    Social History   Socioeconomic History  . Marital status: Divorced    Spouse name: Not on file  . Number of children:  Not on file  . Years of education: Not on file  . Highest education level: Not on file  Occupational History  . Not on file  Tobacco Use  . Smoking status: Former Smoker    Packs/day: 4.00    Years: 15.00    Pack years: 60.00    Types: Cigarettes    Quit date: 04/22/2004    Years since quitting: 15.4  . Smokeless tobacco: Never Used  . Tobacco comment: 4 pack year hx  Substance and Sexual Activity  . Alcohol use: No  . Drug use: No  . Sexual activity: Never  Other Topics Concern  . Not on file  Social History Narrative  . Not on file   Social Determinants of Health   Financial Resource Strain:   . Difficulty of Paying Living Expenses: Not on file  Food Insecurity:   . Worried About Charity fundraiser in the Last Year: Not on file  . Ran Out of Food in the Last Year: Not on file  Transportation Needs:   . Lack of Transportation (Medical): Not on file  . Lack of Transportation (Non-Medical): Not on file  Physical Activity:   . Days of Exercise per Week: Not on file  . Minutes of Exercise per Session: Not on file  Stress:   . Feeling of Stress : Not on file  Social Connections:   . Frequency of Communication with Friends and Family: Not on file  . Frequency of Social Gatherings with Friends and Family: Not on file  . Attends Religious Services: Not on file  . Active Member of Clubs or Organizations: Not on file  . Attends Archivist Meetings: Not on file  . Marital Status: Not on file   Additional Social History:   Sleep: Good  Appetite:  Good  Current Medications: Current Facility-Administered Medications  Medication Dose Route Frequency Provider Last Rate Last Admin  . acetylcysteine (MUCOMYST) 20 % nebulizer / oral solution 600 mg  600 mg Oral TID Sharma Covert, MD   600 mg at 09/16/19 3825  . aspirin EC tablet 81 mg  81 mg Oral Daily Rankin, Shuvon B, NP   81 mg at 09/16/19 0808  . atorvastatin (LIPITOR) tablet 80 mg  80 mg Oral QHS Rankin,  Shuvon B, NP   80 mg at 09/15/19 2114  . busPIRone (BUSPAR) tablet 10 mg  10 mg Oral TID Lindell Spar I, NP   10 mg at 09/16/19 0539  . dextromethorphan-guaiFENesin (MUCINEX DM) 30-600 MG per 12 hr tablet 1 tablet  1 tablet Oral BID PRN Rozetta Nunnery, NP   1 tablet at 09/15/19 0003  . docusate sodium (COLACE) capsule 100 mg  100 mg Oral BID Rankin, Shuvon B, NP   100 mg at 09/16/19 0808  . DULoxetine (CYMBALTA) DR capsule 60 mg  60 mg Oral BID Sharma Covert, MD   60 mg at 09/16/19 7673  .  insulin aspart (novoLOG) injection 0-9 Units  0-9 Units Subcutaneous TID WC Rankin, Shuvon B, NP   1 Units at 09/16/19 0604  . insulin glargine (LANTUS) injection 30 Units  30 Units Subcutaneous q morning - 10a Sharma Covert, MD   30 Units at 09/16/19 1045  . irbesartan (AVAPRO) tablet 150 mg  150 mg Oral Daily Rankin, Shuvon B, NP   150 mg at 09/16/19 0808  . LORazepam (ATIVAN) tablet 1 mg  1 mg Oral TID Sharma Covert, MD   1 mg at 09/16/19 0737  . metFORMIN (GLUCOPHAGE-XR) 24 hr tablet 1,000 mg  1,000 mg Oral Q breakfast Rankin, Shuvon B, NP   1,000 mg at 09/16/19 0808  . miconazole (MICOTIN) 2 % cream   Topical BID Rankin, Shuvon B, NP   Given at 09/15/19 1658  . mirtazapine (REMERON) tablet 30 mg  30 mg Oral QHS Sharma Covert, MD   30 mg at 09/15/19 2116  . OLANZapine (ZYPREXA) tablet 2.5 mg  2.5 mg Oral QHS Sharma Covert, MD   2.5 mg at 09/15/19 2115  . pantoprazole (PROTONIX) EC tablet 40 mg  40 mg Oral BH-q7a Rankin, Shuvon B, NP   40 mg at 09/16/19 1062   Lab Results:  Results for orders placed or performed during the hospital encounter of 09/11/19 (from the past 48 hour(s))  Glucose, capillary     Status: Abnormal   Collection Time: 09/14/19 11:59 AM  Result Value Ref Range   Glucose-Capillary 133 (H) 70 - 99 mg/dL  Urinalysis, Complete w Microscopic     Status: Abnormal   Collection Time: 09/14/19 12:30 PM  Result Value Ref Range   Color, Urine YELLOW YELLOW   APPearance  CLEAR CLEAR   Specific Gravity, Urine 1.016 1.005 - 1.030   pH 5.0 5.0 - 8.0   Glucose, UA >=500 (A) NEGATIVE mg/dL   Hgb urine dipstick NEGATIVE NEGATIVE   Bilirubin Urine NEGATIVE NEGATIVE   Ketones, ur NEGATIVE NEGATIVE mg/dL   Protein, ur NEGATIVE NEGATIVE mg/dL   Nitrite NEGATIVE NEGATIVE   Leukocytes,Ua NEGATIVE NEGATIVE   RBC / HPF 0-5 0 - 5 RBC/hpf   WBC, UA 0-5 0 - 5 WBC/hpf   Bacteria, UA NONE SEEN NONE SEEN   Squamous Epithelial / LPF 0-5 0 - 5   Mucus PRESENT     Comment: Performed at [facility], [place] [address], [place], [place] [address]  Glucose, capillary     Status: Abnormal   Collection Time: 09/14/19  5:01 PM  Result Value Ref Range   Glucose-Capillary 186 (H) 70 - 99 mg/dL  CBC with Differential/Platelet     Status: None   Collection Time: 09/14/19  6:47 PM  Result Value Ref Range   WBC 8.1 4.0 - 10.5 K/uL   RBC 4.18 3.87 - 5.11 MIL/uL   Hemoglobin 12.4 12.0 - 15.0 g/dL   HCT 37.9 36.0 - 46.0 %   MCV 90.7 80.0 - 100.0 fL   MCH 29.7 26.0 - 34.0 pg   MCHC 32.7 30.0 - 36.0 g/dL   RDW 14.5 11.5 - 15.5 %   Platelets 209 150 - 400 K/uL   nRBC 0.0 0.0 - 0.2 %   Neutrophils Relative % 54 %   Neutro Abs 4.5 1.7 - 7.7 K/uL   Lymphocytes Relative 35 %   Lymphs Abs 2.8 0.7 - 4.0 K/uL   Monocytes Relative 6 %   Monocytes Absolute 0.5 0.1 - 1.0 K/uL  Eosinophils Relative 4 %   Eosinophils Absolute 0.3 0.0 - 0.5 K/uL   Basophils Relative 1 %   Basophils Absolute 0.1 0.0 - 0.1 K/uL   Immature Granulocytes 0 %   Abs Immature Granulocytes 0.03 0.00 - 0.07 K/uL    Comment: Performed at [facility], [place] [address], [place], [place] [address]  Comprehensive metabolic panel     Status: Abnormal   Collection Time: 09/14/19  6:47 PM  Result Value Ref Range   Sodium 135 135 - 145 mmol/L   Potassium 4.4 3.5 - 5.1 mmol/L   Chloride 101 98 - 111 mmol/L   CO2 24 22 - 32 mmol/L   Glucose, Bld 241 (H) 70 - 99 mg/dL   BUN 19 8 - 23  mg/dL   Creatinine, Ser 0.82 0.44 - 1.00 mg/dL   Calcium 9.4 8.9 - 10.3 mg/dL   Total Protein 6.8 6.5 - 8.1 g/dL   Albumin 3.6 3.5 - 5.0 g/dL   AST 26 15 - 41 U/L   ALT 33 0 - 44 U/L   Alkaline Phosphatase 95 38 - 126 U/L   Total Bilirubin 0.4 0.3 - 1.2 mg/dL   GFR calc non Af Amer >60 >60 mL/min   GFR calc Af Amer >60 >60 mL/min   Anion gap 10 5 - 15    Comment: Performed at [facility], [place] [address] [place], [place] [address]  Glucose, capillary     Status: Abnormal   Collection Time: 09/14/19  9:02 PM  Result Value Ref Range   Glucose-Capillary 178 (H) 70 - 99 mg/dL  Glucose, capillary     Status: Abnormal   Collection Time: 09/15/19  6:32 AM  Result Value Ref Range   Glucose-Capillary 113 (H) 70 - 99 mg/dL   Comment 1 Notify RN    Comment 2 Document in Chart   Glucose, capillary     Status: Abnormal   Collection Time: 09/15/19  4:51 PM  Result Value Ref Range   Glucose-Capillary 140 (H) 70 - 99 mg/dL  Glucose, capillary     Status: Abnormal   Collection Time: 09/15/19  8:43 PM  Result Value Ref Range   Glucose-Capillary 198 (H) 70 - 99 mg/dL  Glucose, capillary     Status: Abnormal   Collection Time: 09/16/19  5:56 AM  Result Value Ref Range   Glucose-Capillary 146 (H) 70 - 99 mg/dL   Comment 1 Notify RN    Comment 2 Document in Chart    Blood Alcohol level:  Lab Results  Component Value Date   ETH <10 09/10/2019   ETH <10 38/93/7342   Metabolic Disorder Labs: Lab Results  Component Value Date   HGBA1C 9.9 (H) 09/12/2019   MPG 237.43 09/12/2019   MPG 134.11 03/09/2019   No results found for: PROLACTIN Lab Results  Component Value Date   CHOL 174 10/09/2018   TRIG 210 (H) 10/09/2018   HDL 44 10/09/2018   CHOLHDL 4.0 10/09/2018   LDLCALC 88 10/09/2018   LDLCALC 77 06/28/2018   Physical Findings: AIMS: Facial and Oral Movements Muscles of Facial Expression: None, normal Lips and Perioral Area: None, normal Jaw: None,  normal Tongue: None, normal,Extremity Movements Upper (arms, wrists, hands, fingers): None, normal Lower (legs, knees, ankles, toes): None, normal, Trunk Movements Neck, shoulders, hips: None, normal, Overall Severity Severity of abnormal movements (highest score from questions above): None, normal Incapacitation due to abnormal movements: None, normal Patient's awareness of abnormal movements (  rate only patient's report): No Awareness, Dental Status Current problems with teeth and/or dentures?: No Does patient usually wear dentures?: No  CIWA:  CIWA-Ar Total: 1 COWS:  COWS Total Score: 2  Musculoskeletal: Strength & Muscle Tone: within normal limits Gait & Station: normal Patient leans: N/A  Psychiatric Specialty Exam: Physical Exam  Nursing note and vitals reviewed. Constitutional: She is oriented to person, place, and time. She appears well-developed.  Cardiovascular:  Elevated pulse rate: 115  Respiratory: Effort normal.  Genitourinary:    Genitourinary Comments: Deferred   Musculoskeletal:        General: Normal range of motion.     Cervical back: Normal range of motion.  Neurological: She is alert and oriented to person, place, and time.  Skin: Skin is warm and dry.    Review of Systems  Constitutional: Negative for chills and fever.  HENT: Negative for congestion and sore throat.   Respiratory: Negative for cough, shortness of breath and wheezing.   Cardiovascular: Negative for chest pain and palpitations.  Gastrointestinal: Negative for diarrhea, nausea and vomiting.  Skin: Negative for color change.  Psychiatric/Behavioral: Positive for decreased concentration and dysphoric mood. Negative for agitation, behavioral problems, confusion, hallucinations, self-injury, sleep disturbance and suicidal ideas. The patient is nervous/anxious. The patient is not hyperactive.     Blood pressure 116/67, pulse (!) 113, temperature 98.5 F (36.9 C), temperature source Oral, resp.  rate 20, height 5' 5.5" (1.664 m), weight 77.6 kg, SpO2 99 %.Body mass index is 28.02 kg/m.  General Appearance: Disheveled  Eye Contact:  Fair  Speech:  Clear and Coherent and Normal Rate  Volume:  Normal  Mood:  Anxious, Depressed and Dysphoric  Affect:  Flat  Thought Process:  Coherent and Descriptions of Associations: Intact  Orientation:  Full (Time, Place, and Person)  Thought Content:  Rumination  Suicidal Thoughts:  Denies any thoughts, plans or intent  Homicidal Thoughts:  Denies  Memory:  Immediate;   Fair Recent;   Fair Remote;   Fair  Judgement:  Intact  Insight:  Lacking  Psychomotor Activity:  Decreased  Concentration:  Concentration: Fair and Attention Span: Fair  Recall:  Good  Fund of Knowledge:  Fair  Language:  Good  Akathisia:  Negative  Handed:  Right  AIMS (if indicated):     Assets:  Communication Skills Desire for Improvement Social Support  ADL's:  Intact  Cognition:  WNL  Sleep:  Number of Hours: 6.5   Treatment Plan Summary: Daily contact with patient to assess and evaluate symptoms and progress in treatment and Medication management.  -Continue inpatient hospitalization.  -Will continue today 09/16/2019 plan as below except where it is noted.  Depression/mood control             -Continue Olanzapine 2.5 mg po Q hs.   -Continue Cymbalta 90 mg po daily.             -Continue Mirtazapine 15 mg po Q hs for depression/anxiety.  Mood lability.              -Discontinued Gabitril 4 mg po bid for anxiety as well, patient is not benefiting.  -Anxiety  -Continue Lorazepam 1 mg po tid.             -Continue Buspar 10 mg po tid.  -Insomnia  -Continue Mirtazapine 15 mg po Q hs also for depression.  -Other medical issues.  -Continue lisinopril Metformin XR 1,000 mg po daily for DM.             -  Continue Protonix 40 mg po Q am.             -Continue AVAPRO 150 mg po qd for HTN.             -Continue Lantus insulin 72 units Q morning for DM.              -Continue the sliding scale Novolog insulin per BS result.             -Continue Colace 100 mg po bid for constipation.             -Continue Lipitor 80 mg po Q evenings for high cholesterol.             -Asa 81 mg po daily for heart health.             -Mucomyst 20% nebulizer/oral 600 mg  Po tid.              -Continue Micotin 2%, apply topically, apply to vaginal area bid.   -Encourage participation in groups and therapeutic milieu  -Disposition planning will be ongoing  Lindell Spar, NP, PMHNP, FNP-BC 09/16/2019, 11:00 AMPatient ID: Jeanette Rose, female   DOB: [birth year], 63 y.o.   MRN: [id number]

## 2019-09-16 NOTE — Plan of Care (Signed)
  Problem: Coping: Goal: Ability to verbalize frustrations and anger appropriately will improve Outcome: Progressing   Problem: Self-Concept: Goal: Level of anxiety will decrease Outcome: Not Progressing   Problem: Activity: Goal: Interest or engagement in leisure activities will improve Outcome: Not Progressing

## 2019-09-16 NOTE — Progress Notes (Signed)
Patient ID: Jeanette Rose, female   DOB: [birth year], 63 y.o.   MRN: [id number]   Per pt's verbal consent, RN spoke with pt's sister, Helene Kelp [phone]). Pt's sister wanted to follow-up on pt's visit to the ED earlier today. Pt's sister was updated. Support and encouragement provided.

## 2019-09-16 NOTE — ED Provider Notes (Signed)
[facility] DEPT Provider Note   CSN: [id number] Arrival date & time: 09/16/19  1248     History Chief Complaint  Patient presents with  . pencil in R arm    Jeanette Rose is a 63 y.o. female with past medical history of insulin-dependent type 2 diabetes, [facility] with foreign body in her right arm.  Patient states she was turning around tripped over a small table and fell causing a colored pencil to be lodged in her right forearm.  She has pain with any movement of her hand and wrist.  She is denying numbness tingling in her hand.  Not on anticoagulation.  Patient has [facility] caregiver at bedside who confirms story.  The history is provided by the patient.       Past Medical History:  Diagnosis Date  . Acute acalculous cholecystitis s/p lap chole 04/03/2014 04/02/2014  . Allergy   . Anemia   . Anxiety   . Asthma   . Depression   . Fatty liver    noted on CT per [facility] records  . Fibromyalgia   . GERD (gastroesophageal reflux disease)   . Hyperlipidemia associated with type 2 diabetes mellitus ([place])   . Hypertension   . Hypothyroid 2015   Transiently in 2015. Synthroid 45mg was stopped 05/07/2016 w/ tsh nml following.  . Obesity (BMI 30-39.9)   . OSA (obstructive sleep apnea)    non-compliant with CPAP  . Seborrheic dermatitis    [facility]  . Stroke ([facility]    TIA  . Type 2 diabetes mellitus ([facility]     Patient Active Problem List   Diagnosis Date Noted  . Persistent depressive disorder with anxious distress, currently severe 09/16/2019  . MDD (major depressive disorder), recurrent severe, without psychosis ([place] 09/11/2019  . Major depression 03/15/2019  . Severe recurrent major depression without psychotic features ([place] 03/15/2019  . Generalized anxiety disorder 03/04/2019  . Benzodiazepine dependence, continuous ([place] 03/04/2019  . MDD  (major depressive disorder), recurrent episode, severe ([place] 03/04/2019  . Positive colorectal cancer screening using Cologuard test 02/15/2018  . Seborrheic dermatitis   . History of hypothyroidism 10/26/2017  . Elevated ferritin level 10/26/2017  . Vitamin D deficiency 10/26/2017  . Diabetic peripheral neuropathy associated with type 2 diabetes mellitus ([place] 10/26/2017  . OSA (obstructive sleep apnea)   . Hyperlipidemia LDL goal <100   . Major depressive disorder, recurrent episode ([place]   . Constipation 04/22/2014  . Steatohepatitis 04/03/2014  . Anxiety   . Type 2 diabetes mellitus with diabetic polyneuropathy, with long-term current use of insulin ([place]   . GERD (gastroesophageal reflux disease)   . Hypertension   . Obesity (BMI 30-39.9)     Past Surgical History:  Procedure Laterality Date  . BREAST REDUCTION SURGERY  06/2008  . CHOLECYSTECTOMY N/A 04/03/2014   Procedure: LAPAROSCOPIC CHOLECYSTECTOMY ;  Surgeon: SAdin Hector MD;  Location: [facility] ORS;  Service: General;  Laterality: N/A;  . REDUCTION MAMMAPLASTY    . TUBAL LIGATION       OB History   No obstetric history on file.     Family History  Problem Relation Age of Onset  . Atrial fibrillation Mother   . Heart disease Father        CAD  . Diabetes Brother   . Cancer Maternal Grandmother   . Diabetes Paternal Grandmother   . Fibromyalgia Sister   .  Colitis Sister     Social History   Tobacco Use  . Smoking status: Former Smoker    Packs/day: 4.00    Years: 15.00    Pack years: 60.00    Types: Cigarettes    Quit date: 04/22/2004    Years since quitting: 15.4  . Smokeless tobacco: Never Used  . Tobacco comment: 4 pack year hx  Substance Use Topics  . Alcohol use: No  . Drug use: No    Home Medications Prior to Admission medications   Medication Sig Start Date End Date Taking? Authorizing Provider  amantadine (SYMMETREL) 100 MG capsule Take 100 mg by mouth 2 (two) times daily.   Yes [provider]  diazepam (VALIUM) 5 MG tablet Take 5-10 mg by mouth 3 (three) times daily as needed for anxiety.   Yes [provider]  mirtazapine (REMERON) 15 MG tablet Take 15 mg by mouth at bedtime.   Yes [provider]  ARIPiprazole (ABILIFY) 2 MG tablet Take 1 tablet by mouth daily. 09/11/19   [provider]  atorvastatin (LIPITOR) 80 MG tablet Take 1 tablet (80 mg total) by mouth daily at 6 PM. 03/30/19   Clapacs, Madie Reno, MD  cephALEXin (KEFLEX) 500 MG capsule Take 1 capsule (500 mg total) by mouth 2 (two) times daily for 7 days. 09/16/19 09/23/19  Yi Haugan, [place] N, PA-C  Insulin Glargine, 2 Unit Dial, (TOUJEO MAX SOLOSTAR) 300 UNIT/ML SOPN Inject 72 Units into the skin every morning.    [provider]  metFORMIN (GLUCOPHAGE-XR) 500 MG 24 hr tablet Take 1,000 mg by mouth 2 (two) times a day. 03/19/19   [provider]  olmesartan (BENICAR) 20 MG tablet Take 20 mg by mouth daily. 07/29/19   [provider]  omeprazole (PRILOSEC) 40 MG capsule Take 40 mg by mouth daily. 07/30/19   [provider]    Allergies    Farxiga [dapagliflozin], Rexulti [brexpiprazole], and Trintellix [vortioxetine]  Review of Systems   Review of Systems  All other systems reviewed and are negative.   Physical Exam Updated Vital Signs BP (!) 162/87 (BP Location: Left Arm)   Pulse (!) 103   Temp 97.6 F (36.4 C) (Oral)   Resp 19   Ht 5' 5.5" (1.664 m)   Wt 77.6 kg   SpO2 99%   BMI 28.02 kg/m   Physical Exam Vitals and nursing note reviewed.  Constitutional:      General: She is not in acute distress.    Appearance: She is well-developed.  HENT:     Head: Normocephalic and atraumatic.  Eyes:     Conjunctiva/sclera: Conjunctivae normal.  Cardiovascular:     Rate and Rhythm: Normal rate.  Pulmonary:     Effort: Pulmonary effort is normal.  Abdominal:     Palpations: Abdomen is soft.  Musculoskeletal:     Comments: Right mid  forearm with a pencil protruding from the medial aspect.  See image below.  Patient is able to actively move the wrist and fingers.  Normal sensation to hand with strong radial pulse.  Skin:    General: Skin is warm.  Neurological:     Mental Status: She is alert.  Psychiatric:        Behavior: Behavior normal.           ED Results / Procedures / Treatments   Labs (all labs ordered are listed, but only abnormal results are displayed) Labs Reviewed  HEMOGLOBIN A1C - Abnormal; Notable  for the following components:      Result Value   Hgb A1c MFr Bld 9.9 (*)    All other components within normal limits  GLUCOSE, CAPILLARY - Abnormal; Notable for the following components:   Glucose-Capillary 105 (*)    All other components within normal limits  GLUCOSE, CAPILLARY - Abnormal; Notable for the following components:   Glucose-Capillary 141 (*)    All other components within normal limits  GLUCOSE, CAPILLARY - Abnormal; Notable for the following components:   Glucose-Capillary 167 (*)    All other components within normal limits  GLUCOSE, CAPILLARY - Abnormal; Notable for the following components:   Glucose-Capillary 125 (*)    All other components within normal limits  GLUCOSE, CAPILLARY - Abnormal; Notable for the following components:   Glucose-Capillary 100 (*)    All other components within normal limits  GLUCOSE, CAPILLARY - Abnormal; Notable for the following components:   Glucose-Capillary 126 (*)    All other components within normal limits  GLUCOSE, CAPILLARY - Abnormal; Notable for the following components:   Glucose-Capillary 34 (*)    All other components within normal limits  GLUCOSE, CAPILLARY - Abnormal; Notable for the following components:   Glucose-Capillary 61 (*)    All other components within normal limits  GLUCOSE, CAPILLARY - Abnormal; Notable for the following components:   Glucose-Capillary 121 (*)    All other components within normal limits    GLUCOSE, CAPILLARY - Abnormal; Notable for the following components:   Glucose-Capillary 133 (*)    All other components within normal limits  COMPREHENSIVE METABOLIC PANEL - Abnormal; Notable for the following components:   Glucose, Bld 241 (*)    All other components within normal limits  URINALYSIS, COMPLETE (UACMP) WITH MICROSCOPIC - Abnormal; Notable for the following components:   Glucose, UA >=500 (*)    All other components within normal limits  GLUCOSE, CAPILLARY - Abnormal; Notable for the following components:   Glucose-Capillary 186 (*)    All other components within normal limits  GLUCOSE, CAPILLARY - Abnormal; Notable for the following components:   Glucose-Capillary 178 (*)    All other components within normal limits  GLUCOSE, CAPILLARY - Abnormal; Notable for the following components:   Glucose-Capillary 113 (*)    All other components within normal limits  GLUCOSE, CAPILLARY - Abnormal; Notable for the following components:   Glucose-Capillary 140 (*)    All other components within normal limits  GLUCOSE, CAPILLARY - Abnormal; Notable for the following components:   Glucose-Capillary 198 (*)    All other components within normal limits  GLUCOSE, CAPILLARY - Abnormal; Notable for the following components:   Glucose-Capillary 146 (*)    All other components within normal limits  GLUCOSE, CAPILLARY - Abnormal; Notable for the following components:   Glucose-Capillary 141 (*)    All other components within normal limits  GLUCOSE, CAPILLARY - Abnormal; Notable for the following components:   Glucose-Capillary 138 (*)    All other components within normal limits  GLUCOSE, CAPILLARY  GLUCOSE, CAPILLARY  GLUCOSE, CAPILLARY  GLUCOSE, CAPILLARY  CBC WITH DIFFERENTIAL/PLATELET    EKG None  Radiology DG Forearm Right  Result Date: 09/16/2019 CLINICAL DATA:  Pencil stuck in arm. EXAM: RIGHT FOREARM - 2 VIEW COMPARISON:  None. FINDINGS: A foreign body is lodged in  the soft tissues of the right forearm. The foreign body is on the ulnar side of the arm. No fractures are identified. IMPRESSION: 1. The foreign body is lodged in  the ulnar aspect of the right forearm. Electronically Signed   By: Dorise Bullion III M.D   On: 09/16/2019 13:35    Procedures .Foreign Body Removal  Date/Time: 09/16/2019 3:10 PM Performed by: Marine Lezotte, [place] N, PA-C Authorized by: Nylani Michetti, [place] N, PA-C  Consent: Verbal consent obtained. Risks and benefits: risks, benefits and alternatives were discussed Consent given by: patient Patient understanding: patient states understanding of the procedure being performed Imaging studies: imaging studies available Required items: required blood products, implants, devices, and special equipment available Patient identity confirmed: verbally with patient Body area: skin General location: upper extremity Location details: right forearm Anesthesia method: none.  Sedation: Patient sedated: no  Patient restrained: no Dressing: dressing applied Depth: deep Complexity: simple 1 objects recovered. Objects recovered: 1 whole pencil Post-procedure assessment: foreign body removed Patient tolerance: patient tolerated the procedure well with no immediate complications Comments: Patient offered local anesthetic, however declined.  The pencil was grasped and removed without difficulty.  There was scant amount of bleeding after removal.  The object appeared to be whole without any missing pieces.  Wound was then copiously irrigated with normal saline and dressed with a clean dressing.  Patient had normal range of motion of wrist and hand and normal sensation following removal.   (including critical care time)  Medications Ordered in ED Medications  insulin aspart (novoLOG) injection 0-9 Units (1 Units Subcutaneous Given 09/16/19 1154)  aspirin EC tablet 81 mg (81 mg Oral Given 09/16/19 0808)  atorvastatin (LIPITOR) tablet 80 mg (80 mg  Oral Given 09/15/19 2114)  docusate sodium (COLACE) capsule 100 mg (100 mg Oral Given 09/16/19 0808)  irbesartan (AVAPRO) tablet 150 mg (150 mg Oral Given 09/16/19 0808)  metFORMIN (GLUCOPHAGE-XR) 24 hr tablet 1,000 mg (1,000 mg Oral Given 09/16/19 0808)  miconazole (MICOTIN) 2 % cream ( Topical Not Given 09/16/19 0811)  pantoprazole (PROTONIX) EC tablet 40 mg (40 mg Oral Given 09/16/19 0604)  acetylcysteine (MUCOMYST) 20 % nebulizer / oral solution 600 mg (600 mg Oral Not Given 09/16/19 1146)  LORazepam (ATIVAN) tablet 1 mg (1 mg Oral Given 09/16/19 1146)  OLANZapine (ZYPREXA) tablet 2.5 mg (2.5 mg Oral Given 09/15/19 2115)  insulin glargine (LANTUS) injection 30 Units (30 Units Subcutaneous Given 09/16/19 1045)  busPIRone (BUSPAR) tablet 10 mg (10 mg Oral Given 09/16/19 1146)  DULoxetine (CYMBALTA) DR capsule 60 mg (60 mg Oral Given 09/16/19 0811)  mirtazapine (REMERON) tablet 30 mg (30 mg Oral Given 09/15/19 2116)  dextromethorphan-guaiFENesin (MUCINEX DM) 30-600 MG per 12 hr tablet 1 tablet (1 tablet Oral Given 09/15/19 0003)  lidocaine (PF) (XYLOCAINE) 1 % injection 10 mL (10 mLs Infiltration Not Given 09/16/19 1401)  acetaminophen (TYLENOL) tablet 650 mg (650 mg Oral Given 09/16/19 1357)  cephALEXin (KEFLEX) capsule 500 mg (500 mg Oral Given 09/16/19 1401)    ED Course  I have reviewed the triage vital signs and the nursing notes.  Pertinent labs & imaging results that were available during my care of the patient were reviewed by me and considered in my medical decision making (see chart for details).    MDM Rules/Calculators/A&P                      Patient presenting with accidental foreign body to right forearm that occurred while at [facility].  Preserved range of motion, sensation and pulses.  X-ray shows object is embedded in the soft tissue and does not have any bony involvement.  The foreign body is embedded  in the ulnar aspect of the forearm, without evidence of significant  injury to nearby structures on exam.  Foreign body was successfully removed without complication.  Wound was copiously irrigated and left open.  Will cover with Keflex and instructed close monitoring for signs of infection.  Patient is safe for discharge back to [place].  Patient discussed with and evaluated by Dr. Gilford Raid.  Discussed results, findings, treatment and follow up. Patient advised of return precautions. Patient verbalized understanding and agreed with plan.  Final Clinical Impression(s) / ED Diagnoses Final diagnoses:  Superficial foreign body of right forearm, initial encounter    Rx / DC Orders ED Discharge Orders         Ordered    cephALEXin (KEFLEX) 500 MG capsule  2 times daily     09/16/19 1418           Demetrious Rainford, [place] N, [place] 09/16/19 1516    Isla Pence, MD 09/16/19 1531

## 2019-09-16 NOTE — ED Notes (Signed)
Report called to Chrys Racer, RN at [facility], [place] transport called to transport the patient

## 2019-09-16 NOTE — Plan of Care (Signed)
  Problem: Coping: Goal: Ability to verbalize frustrations and anger appropriately will improve Outcome: Progressing Goal: Ability to demonstrate self-control will improve Outcome: Progressing   Problem: Activity: Goal: Interest or engagement in activities will improve Outcome: Not Progressing

## 2019-09-17 ENCOUNTER — Emergency Department (HOSPITAL_COMMUNITY): Payer: Medicare Other

## 2019-09-17 LAB — GLUCOSE, CAPILLARY
Glucose-Capillary: 178 mg/dL — ABNORMAL HIGH (ref 70–99)
Glucose-Capillary: 91 mg/dL (ref 70–99)
Glucose-Capillary: 178 mg/dL — ABNORMAL HIGH (ref 70–99)
Glucose-Capillary: 88 mg/dL (ref 70–99)
Glucose-Capillary: 189 mg/dL — ABNORMAL HIGH (ref 70–99)

## 2019-09-17 MED ORDER — PANTOPRAZOLE SODIUM 40 MG PO TBEC
40.0000 mg | DELAYED_RELEASE_TABLET | Freq: Two times a day (BID) | ORAL | Status: DC
  Administered 2019-09-17 – 2019-09-27 (×20): 40 mg via ORAL
  Filled 2019-09-17 (×25): qty 1

## 2019-09-17 MED ORDER — DULOXETINE HCL 30 MG PO CPEP
90.0000 mg | ORAL_CAPSULE | Freq: Two times a day (BID) | ORAL | Status: DC
  Administered 2019-09-17 – 2019-09-19 (×4): 90 mg via ORAL
  Filled 2019-09-17 (×7): qty 3

## 2019-09-17 MED ORDER — FLUVOXAMINE MALEATE 50 MG PO TABS
50.0000 mg | ORAL_TABLET | Freq: Every day | ORAL | Status: DC
  Administered 2019-09-17 – 2019-09-22 (×6): 50 mg via ORAL
  Filled 2019-09-17 (×9): qty 1

## 2019-09-17 MED ORDER — INSULIN GLARGINE 100 UNIT/ML ~~LOC~~ SOLN
28.0000 [IU] | Freq: Every morning | SUBCUTANEOUS | Status: DC
  Administered 2019-09-18 – 2019-09-27 (×10): 28 [IU] via SUBCUTANEOUS

## 2019-09-17 MED ORDER — IRBESARTAN 300 MG PO TABS
300.0000 mg | ORAL_TABLET | Freq: Every day | ORAL | Status: DC
  Administered 2019-09-18 – 2019-09-27 (×10): 300 mg via ORAL
  Filled 2019-09-17 (×12): qty 1

## 2019-09-17 MED ORDER — BENZONATATE 100 MG PO CAPS
200.0000 mg | ORAL_CAPSULE | Freq: Three times a day (TID) | ORAL | Status: DC | PRN
  Administered 2019-09-17 – 2019-09-25 (×7): 200 mg via ORAL
  Filled 2019-09-17 (×7): qty 2

## 2019-09-17 MED ORDER — BROMPHENIRAMINE-PSEUDOEPH 1-15 MG/5ML PO ELIX: 5.0000 mL | ORAL_SOLUTION | Freq: Four times a day (QID) | ORAL | Status: DC | PRN

## 2019-09-17 MED ORDER — BUSPIRONE HCL 15 MG PO TABS
15.0000 mg | ORAL_TABLET | Freq: Three times a day (TID) | ORAL | Status: DC
  Administered 2019-09-17 – 2019-09-19 (×6): 15 mg via ORAL
  Filled 2019-09-17 (×9): qty 1

## 2019-09-17 NOTE — Progress Notes (Addendum)
[facility] MD Progress Note  09/17/2019 10:21 AM Jeanette Rose  MRN:  [id number] Subjective:  Patient is a 63 year old female with a past psychiatric history significant for major depression, generalized anxiety disorder, posttraumatic stress disorder and components of borderline personality disorder who presented to the [facility] emergency department on 09/10/2019 with suicidal ideation. The patient stated that she felt as though "all I think about his darkness in the end".   Objective: Patient is seen and examined.  Patient is a 63 year old female with the above-stated past psychiatric history who is seen in follow-up.  She is essentially unchanged.  This morning she stated she is unable to get out of bed.  She stated that she is unable to use a crayon.  We discussed that.  Later in the morning she was up walking around.  I discussed with pharmacy the potential for intramuscular ketamine, but it would take 6 weeks to have approval of that.  She had previously been given ECT, but I am not really sure how beneficial that would be.  Her sister and her daughter are completely incapable of caring for her because of her needs.  Her home seems to be in description "a hoarder's place".  She stated she feels worse than she has before.  Family is seeking a long-term placement, but her insurance resources are limited.  Her blood pressure is elevated today.  It is 162/87.  She is mildly tachycardic with a rate of 103.  Yesterday she had a fall in which a pencil was jammed into her arm.  She had be sent to the emergency room.  X-ray did not reveal any bony involvement.  The pencil was removed without incident.  She is on Keflex prophylactically for infection.  She slept 6 hours last night.  Her blood sugar this morning is 91.  Principal Problem: MDD (major depressive disorder), recurrent severe, without psychosis ([place]) Diagnosis: Principal Problem:   MDD (major depressive disorder), recurrent severe,  without psychosis ([place]) Active Problems:   Type 2 diabetes mellitus with diabetic polyneuropathy, with long-term current use of insulin (HCC)   Generalized anxiety disorder   Benzodiazepine dependence, continuous ([place])   Persistent depressive disorder with anxious distress, currently severe  Total Time spent with patient: 30 minutes  Past Psychiatric History: See admission H&P  Past Medical History:  Past Medical History:  Diagnosis Date  . Acute acalculous cholecystitis s/p lap chole 04/03/2014 04/02/2014  . Allergy   . Anemia   . Anxiety   . Asthma   . Depression   . Fatty liver    noted on CT per [facility] records  . Fibromyalgia   . GERD (gastroesophageal reflux disease)   . Hyperlipidemia associated with type 2 diabetes mellitus ([place])   . Hypertension   . Hypothyroid 2015   Transiently in 2015. Synthroid 66mg was stopped 05/07/2016 w/ tsh nml following.  . Obesity (BMI 30-39.9)   . OSA (obstructive sleep apnea)    non-compliant with CPAP  . Seborrheic dermatitis    on face and scalp[facility]  . Stroke ([facility]    TIA  . Type 2 diabetes mellitus ([place]     Past Surgical History:  Procedure Laterality Date  . BREAST REDUCTION SURGERY  06/2008  . CHOLECYSTECTOMY N/A 04/03/2014   Procedure: LAPAROSCOPIC CHOLECYSTECTOMY ;  Surgeon: SAdin Hector MD;  Location: [facility] ORS;  Service: General;  Laterality: N/A;  . REDUCTION MAMMAPLASTY    . TUBAL LIGATION  Family History:  Family History  Problem Relation Age of Onset  . Atrial fibrillation Mother   . Heart disease Father        CAD  . Diabetes Brother   . Cancer Maternal Grandmother   . Diabetes Paternal Grandmother   . Fibromyalgia Sister   . Colitis Sister    Family Psychiatric  History: See admission H&P Social History:  Social History   Substance and Sexual Activity  Alcohol Use No     Social History   Substance and Sexual Activity  Drug Use No    Social History   Socioeconomic  History  . Marital status: Divorced    Spouse name: Not on file  . Number of children: Not on file  . Years of education: Not on file  . Highest education level: Not on file  Occupational History  . Not on file  Tobacco Use  . Smoking status: Former Smoker    Packs/day: 4.00    Years: 15.00    Pack years: 60.00    Types: Cigarettes    Quit date: 04/22/2004    Years since quitting: 15.4  . Smokeless tobacco: Never Used  . Tobacco comment: 4 pack year hx  Substance and Sexual Activity  . Alcohol use: No  . Drug use: No  . Sexual activity: Never  Other Topics Concern  . Not on file  Social History Narrative  . Not on file   Social Determinants of Health   Financial Resource Strain:   . Difficulty of Paying Living Expenses: Not on file  Food Insecurity:   . Worried About Charity fundraiser in the Last Year: Not on file  . Ran Out of Food in the Last Year: Not on file  Transportation Needs:   . Lack of Transportation (Medical): Not on file  . Lack of Transportation (Non-Medical): Not on file  Physical Activity:   . Days of Exercise per Week: Not on file  . Minutes of Exercise per Session: Not on file  Stress:   . Feeling of Stress : Not on file  Social Connections:   . Frequency of Communication with Friends and Family: Not on file  . Frequency of Social Gatherings with Friends and Family: Not on file  . Attends Religious Services: Not on file  . Active Member of Clubs or Organizations: Not on file  . Attends Archivist Meetings: Not on file  . Marital Status: Not on file   Additional Social History:                         Sleep: Fair  Appetite:  Fair  Current Medications: Current Facility-Administered Medications  Medication Dose Route Frequency Provider Last Rate Last Admin  . acetylcysteine (MUCOMYST) 20 % nebulizer / oral solution 600 mg  600 mg Oral TID Sharma Covert, MD   600 mg at 09/17/19 0814  . aspirin EC tablet 81 mg  81 mg  Oral Daily Rankin, Shuvon B, NP   81 mg at 09/17/19 0813  . atorvastatin (LIPITOR) tablet 80 mg  80 mg Oral QHS Rankin, Shuvon B, NP   80 mg at 09/16/19 2113  . busPIRone (BUSPAR) tablet 10 mg  10 mg Oral TID Lindell Spar I, NP   10 mg at 09/17/19 0813  . cephALEXin (KEFLEX) capsule 500 mg  500 mg Oral Q12H Sharma Covert, MD   500 mg at 09/17/19 6160  . dextrom[facility]orphan-[facility]  DM) 30-600 MG per 12 hr tablet 1 tablet  1 tablet Oral BID PRN Rozetta Nunnery, NP   1 tablet at 09/16/19 2010  . docusate sodium (COLACE) capsule 100 mg  100 mg Oral BID Rankin, Shuvon B, NP   100 mg at 09/17/19 0813  . DULoxetine (CYMBALTA) DR capsule 60 mg  60 mg Oral BID Sharma Covert, MD   60 mg at 09/17/19 6378  . insulin aspart (novoLOG) injection 0-9 Units  0-9 Units Subcutaneous TID WC Rankin, Shuvon B, NP   1 Units at 09/16/19 1633  . insulin glargine (LANTUS) injection 30 Units  30 Units Subcutaneous q morning - 10a Sharma Covert, MD   30 Units at 09/16/19 1045  . irbesartan (AVAPRO) tablet 150 mg  150 mg Oral Daily Rankin, Shuvon B, NP   150 mg at 09/17/19 0812  . lidocaine (PF) (XYLOCAINE) 1 % injection 10 mL  10 mL Infiltration Once Robinson, [place] N, PA-C      . LORazepam (ATIVAN) tablet 1 mg  1 mg Oral TID Sharma Covert, MD   1 mg at 09/17/19 5885  . metFORMIN (GLUCOPHAGE-XR) 24 hr tablet 1,000 mg  1,000 mg Oral Q breakfast Rankin, Shuvon B, NP   1,000 mg at 09/17/19 0813  . miconazole (MICOTIN) 2 % cream   Topical BID Rankin, Shuvon B, NP   Given at 09/15/19 1658  . mirtazapine (REMERON) tablet 30 mg  30 mg Oral QHS Sharma Covert, MD   30 mg at 09/16/19 2113  . OLANZapine (ZYPREXA) tablet 2.5 mg  2.5 mg Oral QHS Sharma Covert, MD   2.5 mg at 09/16/19 2113  . pantoprazole (PROTONIX) EC tablet 40 mg  40 mg Oral BH-q7a Rankin, Shuvon B, NP   40 mg at 09/17/19 0277    Lab Results:  Results for orders placed or performed during the hospital encounter of 09/11/19 (from  the past 48 hour(s))  Glucose, capillary     Status: Abnormal   Collection Time: 09/15/19  4:51 PM  Result Value Ref Range   Glucose-Capillary 140 (H) 70 - 99 mg/dL  Glucose, capillary     Status: Abnormal   Collection Time: 09/15/19  8:43 PM  Result Value Ref Range   Glucose-Capillary 198 (H) 70 - 99 mg/dL  Glucose, capillary     Status: Abnormal   Collection Time: 09/16/19  5:56 AM  Result Value Ref Range   Glucose-Capillary 146 (H) 70 - 99 mg/dL   Comment 1 Notify RN    Comment 2 Document in Chart   Glucose, capillary     Status: Abnormal   Collection Time: 09/16/19 11:49 AM  Result Value Ref Range   Glucose-Capillary 141 (H) 70 - 99 mg/dL  Glucose, capillary     Status: Abnormal   Collection Time: 09/16/19  1:56 PM  Result Value Ref Range   Glucose-Capillary 138 (H) 70 - 99 mg/dL  Glucose, capillary     Status: Abnormal   Collection Time: 09/16/19  4:33 PM  Result Value Ref Range   Glucose-Capillary 141 (H) 70 - 99 mg/dL  Glucose, capillary     Status: Abnormal   Collection Time: 09/16/19  8:42 PM  Result Value Ref Range   Glucose-Capillary 177 (H) 70 - 99 mg/dL   Comment 1 Notify RN    Comment 2 Document in Chart   Glucose, capillary     Status: None   Collection Time: 09/17/19  5:49 AM  Result Value Ref Range   Glucose-Capillary 91 70 - 99 mg/dL   Comment 1 Notify RN    Comment 2 Document in Chart     Blood Alcohol level:  Lab Results  Component Value Date   ETH <10 09/10/2019   ETH <10 16/38/4536    Metabolic Disorder Labs: Lab Results  Component Value Date   HGBA1C 9.9 (H) 09/12/2019   MPG 237.43 09/12/2019   MPG 134.11 03/09/2019   No results found for: PROLACTIN Lab Results  Component Value Date   CHOL 174 10/09/2018   TRIG 210 (H) 10/09/2018   HDL 44 10/09/2018   CHOLHDL 4.0 10/09/2018   LDLCALC 88 10/09/2018   LDLCALC 77 06/28/2018    Physical Findings: AIMS: Facial and Oral Movements Muscles of Facial Expression: None, normal Lips and  Perioral Area: None, normal Jaw: None, normal Tongue: None, normal,Extremity Movements Upper (arms, wrists, hands, fingers): None, normal Lower (legs, knees, ankles, toes): None, normal, Trunk Movements Neck, shoulders, hips: None, normal, Overall Severity Severity of abnormal movements (highest score from questions above): None, normal Incapacitation due to abnormal movements: None, normal Patient's awareness of abnormal movements (rate only patient's report): No Awareness, Dental Status Current problems with teeth and/or dentures?: No Does patient usually wear dentures?: No  CIWA:  CIWA-Ar Total: 1 COWS:  COWS Total Score: 2  Musculoskeletal: Strength & Muscle Tone: within normal limits Gait & Station: normal Patient leans: N/A  Psychiatric Specialty Exam: Physical Exam  Nursing note and vitals reviewed. Constitutional: She is oriented to person, place, and time. She appears well-developed and well-nourished.  HENT:  Head: Normocephalic and atraumatic.  Respiratory: Effort normal.  Neurological: She is alert and oriented to person, place, and time.    Review of Systems  Blood pressure (!) 162/87, pulse (!) 103, temperature 97.6 F (36.4 C), temperature source Oral, resp. rate 19, height 5' 5.5" (1.664 m), weight 77.6 kg, SpO2 99 %.Body mass index is 28.02 kg/m.  General Appearance: Disheveled  Eye Contact:  Fair  Speech:  Normal Rate  Volume:  Normal  Mood:  Anxious, Depressed and Dysphoric  Affect:  Congruent  Thought Process:  Coherent and Descriptions of Associations: Circumstantial  Orientation:  Full (Time, Place, and Person)  Thought Content:  Rumination  Suicidal Thoughts:  Yes.  without intent/plan  Homicidal Thoughts:  No  Memory:  Immediate;   Poor Recent;   Poor Remote;   Poor  Judgement:  Impaired  Insight:  Lacking  Psychomotor Activity:  Increased  Concentration:  Concentration: Fair and Attention Span: Fair  Recall:  [employer] of Knowledge:  Fair   Language:  Fair  Akathisia:  Negative  Handed:  Right  AIMS (if indicated):     Assets:  Desire for Improvement Resilience  ADL's:  Impaired  Cognition:  WNL  Sleep:  Number of Hours: 6     Treatment Plan Summary: Daily contact with patient to assess and evaluate symptoms and progress in treatment, Medication management and Plan : Patient is seen and examined.  Patient is a 63 year old female with the above-stated past psychiatric history is seen in follow-up.   Diagnosis: #1 major depression, recurrent, severe without psychotic features, #2 posttraumatic stress disorder, #3 borderline personality disorder, #4 diabetes mellitus type 2, #5 hypertension, #6 recent injury with impalement of pencil in right forearm  Patient is seen in follow-up.  She is essentially unchanged.  A lot of this is behaviorally driven.  I do not think any of the  medications are really beneficial for her right now.  Her sister seem to think that the Cymbalta was the medication she had done best on.  The addition of the N-acetylcysteine also does not appear to been beneficial with her obsessive/rumination thoughts.  I discussed the possibility of intramuscular ketamine with pharmacy, and it would take 6 weeks before we would have a protocol by which would be approved.  I think we have to make some changes in her medicines.  I will start weaning her off of the Cymbalta.,  Start her on Luvox 50 mg p.o. nightly.  I am going to stop the Zyprexa.  We will continue the Remeron for now.  No change in the Ativan given her anxiety levels, and as well I think she is basically physically dependent upon the benzodiazepines.  At this point I really do not think repeat ECT would be beneficial, and I am not sure she would agree with that anyway.  Her blood sugars are stable, and I am going to decrease her Lantus.  She is on 150 mg of Avapro currently, I am going to go on and increase that up to 300 mg a day.  We will check her metabolic  panel in a couple of days. 1.  Stop N-acetylcysteine. 2.  Continue coated aspirin 81 mg p.o. daily. 3.  Continue Lipitor 80 mg p.o. nightly for hyperlipidemia. 4.  Increase BuSpar to 15 mg p.o. 3 times daily for anxiety. 5.  Continue Keflex 500 mg p.o. every 12 hours as needed for possible infection from impalement of pencil. 6.  Decrease Cymbalta to 90 mg p.o. daily to begin wean for anxiety and depression. 7.  Continue sliding scale insulin. 8.  Decrease Lantus insulin to 28 units subcu q. a.m. for diabetes mellitus. 9.  Increase Avapro to 300 mg p.o. daily for hypertension. 10.  Continue lorazepam 1 mg p.o. 3 times daily for anxiety. 11.  Continue Metformin XR 1000 mg p.o. daily for diabetes mellitus. 12.  Continue miconazole 2% cream for vaginal infection. 13.  Continue mirtazapine 30 mg p.o. nightly for anxiety and depression. 14.  Stopped Zyprexa. 15.  Continue Protonix 40 mg p.o. daily for reflux. 16.  Luvox 50 mg p.o. nightly for anxiety and depression. 17.  Disposition planning-in progress.   Sharma Covert, MD 09/17/2019, 10:21 AM

## 2019-09-17 NOTE — [facility] Group Notes (Signed)
Type of Therapy and Topic: Group Therapy: Core Beliefs  Participation Level: Did Not Attend  Description of Group: In this group patients will be encouraged to explore their negative and positive core beliefs about themselves, others, and the world. Each patient will be challenged to identify these beliefs and ways to challenge negative core beliefs. This group will be process-oriented, with patients participating in exploration of their own experiences as well as giving and receiving support and challenge from other group members.  Therapeutic Goals: 1. Patient will identify personal core beliefs, both negative and positive. 2. Patient will identify core beliefs relating to others, both negative and positive. 3. Patient will challenge their negative beliefs about themselves and others. 4. Patient will identify three changes they can make to replace negative core beliefs with positive beliefs.  Summary of Patient Progress  Due to the COVID-19 pandemic, this group has been supplemented with worksheets.   [name], MSW, LCSW Clinical Social Worker [facility]  Phone: [phone]

## 2019-09-17 NOTE — Tx Team (Signed)
Interdisciplinary Treatment and Diagnostic Plan Update  09/17/2019 Time of Session: 8:45am Jeanette Rose MRN: [id number]  Principal Diagnosis: MDD (major depressive disorder), recurrent severe, without psychosis ([place])  Secondary Diagnoses: Principal Problem:   MDD (major depressive disorder), recurrent severe, without psychosis ([place]) Active Problems:   Type 2 diabetes mellitus with diabetic polyneuropathy, with long-term current use of insulin (HCC)   Generalized anxiety disorder   Benzodiazepine dependence, continuous ([place])   Persistent depressive disorder with anxious distress, currently severe   Current Medications:  Current Facility-Administered Medications  Medication Dose Route Frequency Provider Last Rate Last Admin  . acetylcysteine (MUCOMYST) 20 % nebulizer / oral solution 600 mg  600 mg Oral TID Sharma Covert, MD   600 mg at 09/17/19 0814  . aspirin EC tablet 81 mg  81 mg Oral Daily Rankin, Shuvon B, NP   81 mg at 09/17/19 0813  . atorvastatin (LIPITOR) tablet 80 mg  80 mg Oral QHS Rankin, Shuvon B, NP   80 mg at 09/16/19 2113  . busPIRone (BUSPAR) tablet 10 mg  10 mg Oral TID Lindell Spar I, NP   10 mg at 09/17/19 0813  . cephALEXin (KEFLEX) capsule 500 mg  500 mg Oral Q12H Sharma Covert, MD   500 mg at 09/17/19 2297  . dextromethorphan-guaiFENesin (MUCINEX DM) 30-600 MG per 12 hr tablet 1 tablet  1 tablet Oral BID PRN Rozetta Nunnery, NP   1 tablet at 09/16/19 2010  . docusate sodium (COLACE) capsule 100 mg  100 mg Oral BID Rankin, Shuvon B, NP   100 mg at 09/17/19 0813  . DULoxetine (CYMBALTA) DR capsule 60 mg  60 mg Oral BID Sharma Covert, MD   60 mg at 09/17/19 9892  . insulin aspart (novoLOG) injection 0-9 Units  0-9 Units Subcutaneous TID WC Rankin, Shuvon B, NP   1 Units at 09/16/19 1633  . insulin glargine (LANTUS) injection 30 Units  30 Units Subcutaneous q morning - 10a Sharma Covert, MD   30 Units at 09/16/19 1045  . irbesartan (AVAPRO) tablet 150  mg  150 mg Oral Daily Rankin, Shuvon B, NP   150 mg at 09/17/19 0812  . lidocaine (PF) (XYLOCAINE) 1 % injection 10 mL  10 mL Infiltration Once Robinson, [place] N, PA-C      . LORazepam (ATIVAN) tablet 1 mg  1 mg Oral TID Sharma Covert, MD   1 mg at 09/17/19 1194  . metFORMIN (GLUCOPHAGE-XR) 24 hr tablet 1,000 mg  1,000 mg Oral Q breakfast Rankin, Shuvon B, NP   1,000 mg at 09/17/19 0813  . miconazole (MICOTIN) 2 % cream   Topical BID Rankin, Shuvon B, NP   Given at 09/15/19 1658  . mirtazapine (REMERON) tablet 30 mg  30 mg Oral QHS Sharma Covert, MD   30 mg at 09/16/19 2113  . OLANZapine (ZYPREXA) tablet 2.5 mg  2.5 mg Oral QHS Sharma Covert, MD   2.5 mg at 09/16/19 2113  . pantoprazole (PROTONIX) EC tablet 40 mg  40 mg Oral BH-q7a Rankin, Shuvon B, NP   40 mg at 09/17/19 1740   PTA Medications: Medications Prior to Admission  Medication Sig Dispense Refill Last Dose  . amantadine (SYMMETREL) 100 MG capsule Take 100 mg by mouth 2 (two) times daily.     . diazepam (VALIUM) 5 MG tablet Take 5-10 mg by mouth 3 (three) times daily as needed for anxiety.     . mirtazapine (REMERON) 15  MG tablet Take 15 mg by mouth at bedtime.     . ARIPiprazole (ABILIFY) 2 MG tablet Take 1 tablet by mouth daily.     Marland Kitchen atorvastatin (LIPITOR) 80 MG tablet Take 1 tablet (80 mg total) by mouth daily at 6 PM. 30 tablet 1   . Insulin Glargine, 2 Unit Dial, (TOUJEO MAX SOLOSTAR) 300 UNIT/ML SOPN Inject 72 Units into the skin every morning.     . metFORMIN (GLUCOPHAGE-XR) 500 MG 24 hr tablet Take 1,000 mg by mouth 2 (two) times a day.   Not Taking at Unknown time  . olmesartan (BENICAR) 20 MG tablet Take 20 mg by mouth daily.     Marland Kitchen omeprazole (PRILOSEC) 40 MG capsule Take 40 mg by mouth daily.       Patient Stressors: Health problems Medication change or noncompliance  Patient Strengths: Average or above average intelligence General fund of knowledge Motivation for treatment/growth Supportive  family/friends  Treatment Modalities: Medication Management, Group therapy, Case management,  1 to 1 session with clinician, Psychoeducation, Recreational therapy.   Physician Treatment Plan for Primary Diagnosis: MDD (major depressive disorder), recurrent severe, without psychosis ([place]) Long Term Goal(s): Improvement in symptoms so as ready for discharge Improvement in symptoms so as ready for discharge   Short Term Goals: Ability to identify changes in lifestyle to reduce recurrence of condition will improve Ability to verbalize feelings will improve Ability to disclose and discuss suicidal ideas Ability to identify and develop effective coping behaviors will improve Ability to maintain clinical measurements within normal limits will improve Compliance with prescribed medications will improve  Medication Management: Evaluate patient's response, side effects, and tolerance of medication regimen.  Therapeutic Interventions: 1 to 1 sessions, Unit Group sessions and Medication administration.  Evaluation of Outcomes: Not Met  Physician Treatment Plan for Secondary Diagnosis: Principal Problem:   MDD (major depressive disorder), recurrent severe, without psychosis ([place]) Active Problems:   Type 2 diabetes mellitus with diabetic polyneuropathy, with long-term current use of insulin (HCC)   Generalized anxiety disorder   Benzodiazepine dependence, continuous ([facility]CC)   Persistent depressive disorder with anxious distress, currently severe  Long Term Goal(s): Improvement in symptoms so as ready for discharge Improvement in symptoms so as ready for discharge   Short Term Goals: Ability to identify changes in lifestyle to reduce recurrence of condition will improve Ability to verbalize feelings will improve Ability to disclose and discuss suicidal ideas Ability to identify and develop effective coping behaviors will improve Ability to maintain clinical measurements within normal limits will  improve Compliance with prescribed medications will improve     Medication Management: Evaluate patient's response, side effects, and tolerance of medication regimen.  Therapeutic Interventions: 1 to 1 sessions, Unit Group sessions and Medication administration.  Evaluation of Outcomes: Not Met   RN Treatment Plan for Primary Diagnosis: MDD (major depressive disorder), recurrent severe, without psychosis ([place]) Long Term Goal(s): Knowledge of disease and therapeutic regimen to maintain health will improve  Short Term Goals: Ability to participate in decision making will improve, Ability to verbalize feelings will improve, Ability to disclose and discuss suicidal ideas and Ability to identify and develop effective coping behaviors will improve  Medication Management: RN will administer medications as ordered by provider, will assess and evaluate patient's response and provide education to patient for prescribed medication. RN will report any adverse and/or side effects to prescribing provider.  Therapeutic Interventions: 1 on 1 counseling sessions, Psychoeducation, Medication administration, Evaluate responses to treatment, Monitor vital signs and  CBGs as ordered, Perform/monitor CIWA, COWS, AIMS and Fall Risk screenings as ordered, Perform wound care treatments as ordered.  Evaluation of Outcomes: Not Met   LCSW Treatment Plan for Primary Diagnosis: MDD (major depressive disorder), recurrent severe, without psychosis ([place]) Long Term Goal(s): Safe transition to appropriate next level of care at discharge, Engage patient in therapeutic group addressing interpersonal concerns.  Short Term Goals: Engage patient in aftercare planning with referrals and resources  Therapeutic Interventions: Assess for all discharge needs, 1 to 1 time with Social worker, Explore available resources and support systems, Assess for adequacy in community support network, Educate family and significant other(s) on  suicide prevention, Complete Psychosocial Assessment, Interpersonal group therapy.  Evaluation of Outcomes: Not Met   Progress in Treatment: Attending groups: No. New to unit  Participating in groups: No. Taking medication as prescribed: Yes. Toleration medication: Yes. Family/Significant other contact made: Yes, individual(s) contacted:  sister, Helene Kelp Patient understands diagnosis: Yes. Discussing patient identified problems/goals with staff: Yes. Medical problems stabilized or resolved: Yes. Denies suicidal/homicidal ideation: Yes. Issues/concerns per patient self-inventory: No.  New problem(s) identified: Concerns of ability to live independently.  New Short Term/Long Term Goal(s):  medication stabilization, elimination of SI thoughts, development of comprehensive mental wellness plan.   Patient Goals:  "I just want to get better"  Discharge Plan or Barriers: Patient has been referred to [facility], but they are likely out of network. Patient is current with Dr.Kaur. CSW continuing to follow for safe discharge plan.  Reason for Continuation of Hospitalization: Anxiety Depression Medication stabilization Suicidal ideation  Estimated Length of Stay: 5-7 days  Attendees: Patient: Jeanette Rose 09/17/2019 9:44 AM  Physician: Dr. Myles Lipps, MD 09/17/2019 9:44 AM  Nursing: Rise Paganini.Raliegh Ip, RN 09/17/2019 9:44 AM  RN Care Manager: 09/17/2019 9:44 AM  Social Worker: [name], LCSW 09/17/2019 9:44 AM  Recreational Therapist:  09/17/2019 9:44 AM  Other: Harriett Sine, NP 09/17/2019 9:44 AM  Other:  09/17/2019 9:44 AM  Other: 09/17/2019 9:44 AM    Scribe for Treatment Team: Joellen Jersey, [place] 09/17/2019 9:44 AM

## 2019-09-17 NOTE — Progress Notes (Signed)
D.  Pt in bed on approach, still not feeling well.  Pt denies pain from where pencil went into her arm on dayshift.  Pt did not feel well enough to get up for group, has stayed in room.  Pt continues to cough quite a bit at night with poor sleep due to this.  Pt denies SI/HI/AVH at this time.  A.  Support and encouragement offered, medication given as ordered.  R.  Pt remains safe on the unit, will continue to monitor.

## 2019-09-17 NOTE — Progress Notes (Signed)
OT Cancellation Note and Discharge  Patient Details Name: Jeanette Rose MRN: [id number] DOB: 30-Dec-1955   Cancelled Treatment:    Reason Eval/Treat Not Completed: Other (comment). In room to see patient, stepped out to get ADL supplies and upon re-entry MD was in there and reported that he was going to cancel OT order but just has not gotten to computer yet to do so. Eval not completed.   [facility]  OTR/L Acute [employer] Pager [phone] Office [phone]     09/17/2019, 10:14 AM

## 2019-09-17 NOTE — Progress Notes (Signed)
     09/17/19 0900  Psych Admission Type (Psych Patients Only)  Admission Status Voluntary  Psychosocial Assessment  Patient Complaints Anxiety  Eye Contact Brief  Facial Expression Anxious  Affect Anxious  Speech Logical/coherent  Interaction Minimal  Motor Activity Slow  Appearance/Hygiene Disheveled  Behavior Characteristics Appropriate to situation  Mood Anxious  Thought Process  Coherency WDL  Content Obsessions  Delusions None reported or observed  Perception WDL  Hallucination None reported or observed  Judgment Poor  Confusion None  Danger to Self  Current suicidal ideation? Denies  Danger to Others  Danger to Others None reported or observed

## 2019-09-17 NOTE — Progress Notes (Signed)
ADULT GRIEF GROUP NOTE:  Spiritual care group on grief and loss facilitated by chaplain Jerene Pitch MDiv, BCC  Group Goal:  Support / Education around grief and loss Members engage in facilitated group support and psycho-social education.  Group Description:  Following introductions and group rules, group members engaged in facilitated group dialog and support around topic of loss, with particular support around experiences of loss in their lives. Group Identified types of loss (relationships / self / things) and identified patterns, circumstances, and changes that precipitate losses. Reflected on thoughts / feelings around loss, normalized grief responses, and recognized variety in grief experience.   Group noted Worden's four tasks of grief in discussion.  Group drew on Adlerian / Rogerian, narrative, MI, Patient Progress:   Did not attend

## 2019-09-17 NOTE — Progress Notes (Signed)
Recreation Therapy Notes  Date:  12.21.20 Time: 0930 Location: [address] Dayroom  Group Topic: Stress Management  Goal Area(s) Addresses:  Patient will identify positive stress management techniques. Patient will identify benefits of using stress management post d/c.  Intervention: Stress Management  Activity :  Meditation.  LRT played a meditation that focused on making the most of your day.  Patients were to follow along as meditation played to fully engage in activity.  Education:  Stress Management, Discharge Planning.   Education Outcome: Acknowledges Education  Clinical Observations/Feedback:  Pt did not attend group.    Victorino Sparrow, LRT/CTRS         Victorino Sparrow A 09/17/2019 10:54 AM

## 2019-09-18 LAB — GLUCOSE, CAPILLARY
Glucose-Capillary: 141 mg/dL — ABNORMAL HIGH (ref 70–99)
Glucose-Capillary: 145 mg/dL — ABNORMAL HIGH (ref 70–99)
Glucose-Capillary: 103 mg/dL — ABNORMAL HIGH (ref 70–99)
Glucose-Capillary: 203 mg/dL — ABNORMAL HIGH (ref 70–99)

## 2019-09-18 NOTE — Progress Notes (Signed)
[facility] MD Progress Note  09/18/2019 4:24 PM Jeanette Rose  MRN:  [id number] Subjective:  Patient reports she continues to feel anxious and " scared" but has difficulty identifying what she is scared of . She does state she feels she has difficulty functioning independently . Currently denies suicidal ideations and contracts for safety on unit. Does not endorse medication side effects at this time.'   Objective:  I have discussed case with treatment team and have met with patient. 63 year old female, history of depression, anxiety, prior diagnosis of PTSD, GAD, MDD. Presented to hospital on 12/14 reporting worsening symptoms of depression, anxiety and suicidal ideations.  Today patient presents alert, attentive, anxious/ruminative . Some response to supportive/empathic approach and anxiety decreased partially. As above, reports persistent sense of anxiety and " fear" and worries about her overall ability to function independently. As reviewed in Dr. Karmen Stabs notes , she is currently being weaned off Cymbalta and has been started on Luvox. She denies medication side effects at present and is tolerating well thus far . Currently denies suicidal ideations and contracts for safety on unit. Limited milieu participation/ most time spent in room although visible in hallway at times .  CBGs today 141, 145   Principal Problem: MDD (major depressive disorder), recurrent severe, without psychosis ([place]) Diagnosis: Principal Problem:   MDD (major depressive disorder), recurrent severe, without psychosis ([place]) Active Problems:   Type 2 diabetes mellitus with diabetic polyneuropathy, with long-term current use of insulin (H[facility])   Generalized anxiety disorder   Benzodiazepine dependence, continuous ([place])   Persistent depressive disorder with anxious distress, currently severe  Total Time spent with patient: 20 minutes  Past Psychiatric History: See admission H&P  Past Medical History:  Past Medical History:   Diagnosis Date  . Acute acalculous cholecystitis s/p lap chole 04/03/2014 04/02/2014  . Allergy   . Anemia   . Anxiety   . Asthma   . Depression   . Fatty liver    noted on CT per [facility] records  . Fibromyalgia   . GERD (gastroesophageal reflux disease)   . Hyperlipidemia associated with type 2 diabetes mellitus ([place])   . Hypertension   . Hypothyroid 2015   Transiently in 2015. Synthroid 60mg was stopped 05/07/2016 w/ tsh nml following.  . Obesity (BMI 30-39.9)   . OSA (obstructive sleep apnea)    non-compliant with CPAP  . Seborrheic dermatitis    [facility]  . Stroke ([facility]    TIA  . Type 2 diabetes mellitus ([place]     Past Surgical History:  Procedure Laterality Date  . BREAST REDUCTION SURGERY  06/2008  . CHOLECYSTECTOMY N/A 04/03/2014   Procedure: LAPAROSCOPIC CHOLECYSTECTOMY ;  Surgeon: SAdin Hector MD;  Location: [facility] ORS;  Service: General;  Laterality: N/A;  . REDUCTION MAMMAPLASTY    . TUBAL LIGATION     Family History:  Family History  Problem Relation Age of Onset  . Atrial fibrillation Mother   . Heart disease Father        CAD  . Diabetes Brother   . Cancer Maternal Grandmother   . Diabetes Paternal Grandmother   . Fibromyalgia Sister   . Colitis Sister    Family Psychiatric  History: See admission H&P Social History:  Social History   Substance and Sexual Activity  Alcohol Use No     Social History   Substance and Sexual Activity  Drug Use No    Social History  Socioeconomic History  . Marital status: Divorced    Spouse name: Not on file  . Number of children: Not on file  . Years of education: Not on file  . Highest education level: Not on file  Occupational History  . Not on file  Tobacco Use  . Smoking status: Former Smoker    Packs/day: 4.00    Years: 15.00    Pack years: 60.00    Types: Cigarettes    Quit date: 04/22/2004    Years since quitting: 15.4  . Smokeless tobacco: Never Used  .  Tobacco comment: 4 pack year hx  Substance and Sexual Activity  . Alcohol use: No  . Drug use: No  . Sexual activity: Never  Other Topics Concern  . Not on file  Social History Narrative  . Not on file   Social Determinants of Health   Financial Resource Strain:   . Difficulty of Paying Living Expenses: Not on file  Food Insecurity:   . Worried About Charity fundraiser in the Last Year: Not on file  . Ran Out of Food in the Last Year: Not on file  Transportation Needs:   . Lack of Transportation (Medical): Not on file  . Lack of Transportation (Non-Medical): Not on file  Physical Activity:   . Days of Exercise per Week: Not on file  . Minutes of Exercise per Session: Not on file  Stress:   . Feeling of Stress : Not on file  Social Connections:   . Frequency of Communication with Friends and Family: Not on file  . Frequency of Social Gatherings with Friends and Family: Not on file  . Attends Religious Services: Not on file  . Active Member of Clubs or Organizations: Not on file  . Attends Archivist Meetings: Not on file  . Marital Status: Not on file   Additional Social History:   Sleep: Fair/ improving   Appetite:  Fair  Current Medications: Current Facility-Administered Medications  Medication Dose Route Frequency Provider Last Rate Last Admin  . aspirin EC tablet 81 mg  81 mg Oral Daily Rankin, Shuvon B, NP   81 mg at 09/18/19 0758  . atorvastatin (LIPITOR) tablet 80 mg  80 mg Oral QHS Rankin, Shuvon B, NP   80 mg at 09/17/19 2150  . benzonatate (TESSALON) capsule 200 mg  200 mg Oral TID PRN Sharma Covert, MD   200 mg at 09/17/19 2249  . busPIRone (BUSPAR) tablet 15 mg  15 mg Oral TID Sharma Covert, MD   15 mg at 09/18/19 1157  . cephALEXin (KEFLEX) capsule 500 mg  500 mg Oral Q12H Sharma Covert, MD   500 mg at 09/18/19 0759  . docusate sodium (COLACE) capsule 100 mg  100 mg Oral BID Rankin, Shuvon B, NP   100 mg at 09/18/19 0758  .  DULoxetine (CYMBALTA) DR capsule 90 mg  90 mg Oral BID Sharma Covert, MD   90 mg at 09/18/19 0758  . fluvoxaMINE (LUVOX) tablet 50 mg  50 mg Oral QHS Sharma Covert, MD   50 mg at 09/17/19 2150  . insulin aspart (novoLOG) injection 0-9 Units  0-9 Units Subcutaneous TID WC Rankin, Shuvon B, NP   1 Units at 09/18/19 1159  . insulin glargine (LANTUS) injection 28 Units  28 Units Subcutaneous q morning - 10a Sharma Covert, MD   28 Units at 09/18/19 1006  . irbesartan (AVAPRO) tablet 300 mg  300  mg Oral Daily Sharma Covert, MD   300 mg at 09/18/19 0759  . lidocaine (PF) (XYLOCAINE) 1 % injection 10 mL  10 mL Infiltration Once Robinson, [place] N, PA-C      . LORazepam (ATIVAN) tablet 1 mg  1 mg Oral TID Sharma Covert, MD   1 mg at 09/18/19 1157  . metFORMIN (GLUCOPHAGE-XR) 24 hr tablet 1,000 mg  1,000 mg Oral Q breakfast Rankin, Shuvon B, NP   1,000 mg at 09/18/19 0758  . miconazole (MICOTIN) 2 % cream   Topical BID Rankin, Shuvon B, NP   Given at 09/18/19 0759  . mirtazapine (REMERON) tablet 30 mg  30 mg Oral QHS Sharma Covert, MD   30 mg at 09/17/19 2150  . pantoprazole (PROTONIX) EC tablet 40 mg  40 mg Oral BID Sharma Covert, MD   40 mg at 09/18/19 7253    Lab Results:  Results for orders placed or performed during the hospital encounter of 09/11/19 (from the past 48 hour(s))  Glucose, capillary     Status: Abnormal   Collection Time: 09/16/19  4:33 PM  Result Value Ref Range   Glucose-Capillary 141 (H) 70 - 99 mg/dL  Glucose, capillary     Status: Abnormal   Collection Time: 09/16/19  8:42 PM  Result Value Ref Range   Glucose-Capillary 177 (H) 70 - 99 mg/dL   Comment 1 Notify RN    Comment 2 Document in Chart   Glucose, capillary     Status: None   Collection Time: 09/17/19  5:49 AM  Result Value Ref Range   Glucose-Capillary 91 70 - 99 mg/dL   Comment 1 Notify RN    Comment 2 Document in Chart   Glucose, capillary     Status: Abnormal   Collection  Time: 09/17/19 11:43 AM  Result Value Ref Range   Glucose-Capillary 178 (H) 70 - 99 mg/dL  Glucose, capillary     Status: None   Collection Time: 09/17/19  5:00 PM  Result Value Ref Range   Glucose-Capillary 88 70 - 99 mg/dL  Glucose, capillary     Status: Abnormal   Collection Time: 09/17/19  8:43 PM  Result Value Ref Range   Glucose-Capillary 189 (H) 70 - 99 mg/dL  Glucose, capillary     Status: Abnormal   Collection Time: 09/18/19  6:17 AM  Result Value Ref Range   Glucose-Capillary 141 (H) 70 - 99 mg/dL  Glucose, capillary     Status: Abnormal   Collection Time: 09/18/19 11:53 AM  Result Value Ref Range   Glucose-Capillary 145 (H) 70 - 99 mg/dL    Blood Alcohol level:  Lab Results  Component Value Date   ETH <10 09/10/2019   ETH <10 66/44/0347    Metabolic Disorder Labs: Lab Results  Component Value Date   HGBA1C 9.9 (H) 09/12/2019   MPG 237.43 09/12/2019   MPG 134.11 03/09/2019   No results found for: PROLACTIN Lab Results  Component Value Date   CHOL 174 10/09/2018   TRIG 210 (H) 10/09/2018   HDL 44 10/09/2018   CHOLHDL 4.0 10/09/2018   LDLCALC 88 10/09/2018   LDLCALC 77 06/28/2018    Physical Findings: AIMS: Facial and Oral Movements Muscles of Facial Expression: None, normal Lips and Perioral Area: None, normal Jaw: None, normal Tongue: None, normal,Extremity Movements Upper (arms, wrists, hands, fingers): None, normal Lower (legs, knees, ankles, toes): None, normal, Trunk Movements Neck, shoulders, hips: None, normal, Overall Severity Severity  of abnormal movements (highest score from questions above): None, normal Incapacitation due to abnormal movements: None, normal Patient's awareness of abnormal movements (rate only patient's report): No Awareness, Dental Status Current problems with teeth and/or dentures?: No Does patient usually wear dentures?: No  CIWA:  CIWA-Ar Total: 1 COWS:  COWS Total Score: 2  Musculoskeletal: Strength & Muscle  Tone: within normal limits Gait & Station: normal Patient leans: N/A  Psychiatric Specialty Exam: Physical Exam  Nursing note and vitals reviewed. Constitutional: She is oriented to person, place, and time. She appears well-developed and well-nourished.  HENT:  Head: Normocephalic and atraumatic.  Respiratory: Effort normal.  Neurological: She is alert and oriented to person, place, and time.    Review of Systems denies chest pain , no shortness of breath at room air   Blood pressure 123/73, pulse (!) 101, temperature (!) 97.3 F (36.3 C), temperature source Oral, resp. rate 18, height 5' 5.5" (1.664 m), weight 77.6 kg, SpO2 99 %.Body mass index is 28.02 kg/m.  General Appearance: Fairly Groomed  Eye Contact:  Fair  Speech:  Normal Rate  Volume:  Normal  Mood:  Depressed and anxious   Affect:  Congruent  Thought Process:  Linear and Descriptions of Associations: Circumstantial  Orientation:  Other:  fully alert and attentive  Thought Content:  ruminative, no hallucinations, does not appear internally preoccupied at this time   Suicidal Thoughts:  No currently denies suicidal plan or intention   Homicidal Thoughts:  No  Memory:  recent and remote fair   Judgement:  Fair  Insight:  Lacking  Psychomotor Activity:  Normal  Concentration:  Concentration: Fair and Attention Span: Fair  Recall:  [employer] of Knowledge:  Fair  Language:  Fair  Akathisia:  Negative  Handed:  Right  AIMS (if indicated):     Assets:  Desire for Improvement Resilience  ADL's:  Impaired  Cognition:  WNL  Sleep:  Number of Hours: 5.75   Assessment :  63 year old female, history of depression, anxiety, prior diagnosis of PTSD, GAD, MDD. Presented to hospital on 12/14 reporting worsening symptoms of depression, anxiety and suicidal ideations.  Patient presents anxious, fearful, ruminative. Notes indicate limited improvement thus far , with significant persistent symptoms. Denies hallucinations and no  overt psychotic symptoms noted at this time. Today denies SI. Currently tolerating medications well ( Dr. Mallie Darting had been following daily and initiated Cymbalta taper/Luvox trial) . Patient was treated with ECT in the past and I will discuss this option with her as well .   Treatment Plan Summary: Treatment Plan reviewed as below today 12/22  Encourage group and milieu participation  Continue coated aspirin 81 mg p.o. daily. Continue Lipitor 80 mg p.o. nightly for hyperlipidemia. Continue  BuSpar  15 mg p.o. 3 times daily for anxiety. Continue Keflex 500 mg p.o. every 12 hours as needed for possible infection from impalement of pencil. Decrease  Cymbalta to 60 mg p.o. daily to begin wean for anxiety and depression. Continue sliding scale insulin. Continue  Lantus insulin  28 units subcu q. a.m. for diabetes mellitus. Continue  Avapro  300 mg p.o. daily for hypertension. Continue Lorazepam 1 mg p.o. 3 times daily for anxiety. Continue Metformin XR 1000 mg p.o. daily for diabetes mellitus. Continue Mirtazapine 30 mg p.o. nightly for anxiety and depression. Continue Protonix 40 mg p.o. daily for reflux. Continue Luvox 50 mg p.o. nightly for anxiety and depression. Disposition planning-in progress.   Jenne Campus, MD  09/18/2019, 4:24 PM   Patient ID: Jeanette Rose, female   DOB: Oct 27, 1955, 63 y.o.   MRN: [id number]

## 2019-09-18 NOTE — Progress Notes (Signed)
    09/18/19 0900  Psych Admission Type (Psych Patients Only)  Admission Status Voluntary  Psychosocial Assessment  Patient Complaints Anxiety  Eye Contact Brief  Facial Expression Anxious  Affect Anxious  Speech Logical/coherent  Interaction Minimal  Motor Activity Slow  Appearance/Hygiene Disheveled  Behavior Characteristics Anxious  Mood Anxious;Preoccupied  Thought Process  Coherency WDL  Content Obsessions  Delusions None reported or observed  Perception WDL  Hallucination None reported or observed  Judgment Poor  Confusion None  Danger to Self  Current suicidal ideation? Denies  Danger to Others  Danger to Others None reported or observed

## 2019-09-18 NOTE — [facility] Group Notes (Signed)
Adult Psychoeducational Group Note  Date:  09/18/2019 Time:  12:23 AM  Group Topic/Focus:  Wrap-Up Group:   The focus of this group is to help patients review their daily goal of treatment and discuss progress on daily workbooks.  Participation Level:  Did Not Attend  Participation Quality:   Affect:    Cognitive:   Insight:   Engagement in Group:    Modes of Intervention:   Additional Comments:  Pt was invited to attend but declined.  Yolanda Dockendorf 09/18/2019, 12:23 AM

## 2019-09-18 NOTE — Progress Notes (Signed)
   09/17/19 2355  Psych Admission Type (Psych Patients Only)  Admission Status Voluntary  Psychosocial Assessment  Patient Complaints Anxiety  Eye Contact Brief  Facial Expression Anxious  Affect Anxious  Speech Logical/coherent  Interaction Minimal  Motor Activity Slow  Appearance/Hygiene Disheveled  Thought Process  Coherency WDL  Content Obsessions  Delusions None reported or observed  Perception WDL  Hallucination None reported or observed  Judgment Poor  Confusion None  Danger to Self  Current suicidal ideation? Denies  Danger to Others  Danger to Others None reported or observed  D: Patient in her room most of the evening. Pt c/o coughing.   A: Medications administered as prescribed. HOB elevated. Support and encouragement provided as needed.  R: Patient remains safe on the unit. Will continue to monitor for safety and stability.

## 2019-09-18 NOTE — Progress Notes (Signed)
   09/18/19 2212  Psych Admission Type (Psych Patients Only)  Admission Status Voluntary  Psychosocial Assessment  Patient Complaints Depression;Helplessness  Eye Contact Brief  Facial Expression Anxious  Affect Anxious  Speech Logical/coherent  Interaction Minimal  Motor Activity Slow  Appearance/Hygiene Disheveled  Behavior Characteristics Unwilling to participate  Mood Preoccupied;Depressed  Thought Process  Coherency WDL  Content Obsessions  Delusions None reported or observed  Perception WDL  Hallucination None reported or observed  Judgment Poor  Confusion None  Danger to Self  Current suicidal ideation? Denies  Danger to Others  Danger to Others None reported or observed  D: Encouraged group participation but refused.  Pt appears depressed and unmotivated stating "she feels "overwhelmed and afraid". A: Medications administered as prescribed. Support and encouragement provided as needed.  R: Patient remains safe on the unit. Will continue to monitor for safety and stability.

## 2019-09-18 NOTE — Progress Notes (Signed)
Recreation Therapy Notes  Animal-Assisted Activity (AAA) Program Checklist/Progress Notes Patient Eligibility Criteria Checklist & Daily Group note for Rec Tx Intervention  Date: 12.22.20 Time: 87 Location: [address]   AAA/T Program Assumption of Risk Form signed by Teacher, music or Parent Legal Guardian  YES   Patient is free of allergies or sever asthma  YES   Patient reports no fear of animals  YES   Patient reports no history of cruelty to animals YES   Patient understands his/her participation is voluntary YES   Patient washes hands before animal contact YES   Patient washes hands after animal contact  YES  Education: Contractor, Appropriate Animal Interaction   Education Outcome: Acknowledges understanding/In group clarification offered/Needs additional education.   Clinical Observations/Feedback: Pt did not attend.   Victorino Sparrow, LRT/CTRS         Victorino Sparrow A 09/18/2019 3:41 PM

## 2019-09-19 LAB — GLUCOSE, CAPILLARY
Glucose-Capillary: 110 mg/dL — ABNORMAL HIGH (ref 70–99)
Glucose-Capillary: 156 mg/dL — ABNORMAL HIGH (ref 70–99)
Glucose-Capillary: 101 mg/dL — ABNORMAL HIGH (ref 70–99)
Glucose-Capillary: 152 mg/dL — ABNORMAL HIGH (ref 70–99)

## 2019-09-19 MED ORDER — MIRTAZAPINE 15 MG PO TABS
15.0000 mg | ORAL_TABLET | Freq: Every day | ORAL | Status: DC
  Administered 2019-09-19: 15 mg via ORAL
  Filled 2019-09-19 (×2): qty 1

## 2019-09-19 MED ORDER — DULOXETINE HCL 60 MG PO CPEP
60.0000 mg | ORAL_CAPSULE | Freq: Every day | ORAL | Status: DC
  Administered 2019-09-20: 60 mg via ORAL
  Filled 2019-09-19 (×3): qty 1

## 2019-09-19 MED ORDER — BUSPIRONE HCL 10 MG PO TABS
10.0000 mg | ORAL_TABLET | Freq: Three times a day (TID) | ORAL | Status: DC
  Administered 2019-09-19 – 2019-09-20 (×3): 10 mg via ORAL
  Filled 2019-09-19 (×2): qty 1
  Filled 2019-09-19: qty 2
  Filled 2019-09-19 (×4): qty 1

## 2019-09-19 NOTE — Tx Team (Signed)
Interdisciplinary Treatment and Diagnostic Plan Update  09/19/2019 Time of Session: 8:45am Jeanette Rose MRN: [id number]  Principal Diagnosis: MDD (major depressive disorder), recurrent severe, without psychosis ([place])  Secondary Diagnoses: Principal Problem:   MDD (major depressive disorder), recurrent severe, without psychosis ([place]) Active Problems:   Type 2 diabetes mellitus with diabetic polyneuropathy, with long-term current use of insulin (HCC)   Generalized anxiety disorder   Benzodiazepine dependence, continuous ([place])   Persistent depressive disorder with anxious distress, currently severe   Current Medications:  Current Facility-Administered Medications  Medication Dose Route Frequency Provider Last Rate Last Admin  . aspirin EC tablet 81 mg  81 mg Oral Daily Rankin, Shuvon B, NP   81 mg at 09/19/19 0731  . atorvastatin (LIPITOR) tablet 80 mg  80 mg Oral QHS Rankin, Shuvon B, NP   80 mg at 09/18/19 2131  . benzonatate (TESSALON) capsule 200 mg  200 mg Oral TID PRN Sharma Covert, MD   200 mg at 09/19/19 0037  . busPIRone (BUSPAR) tablet 15 mg  15 mg Oral TID Sharma Covert, MD   15 mg at 09/19/19 0731  . cephALEXin (KEFLEX) capsule 500 mg  500 mg Oral Q12H Sharma Covert, MD   500 mg at 09/19/19 0731  . docusate sodium (COLACE) capsule 100 mg  100 mg Oral BID Rankin, Shuvon B, NP   100 mg at 09/19/19 0732  . DULoxetine (CYMBALTA) DR capsule 90 mg  90 mg Oral BID Sharma Covert, MD   90 mg at 09/19/19 0739  . fluvoxaMINE (LUVOX) tablet 50 mg  50 mg Oral QHS Sharma Covert, MD   50 mg at 09/18/19 2131  . insulin aspart (novoLOG) injection 0-9 Units  0-9 Units Subcutaneous TID WC Rankin, Shuvon B, NP   1 Units at 09/18/19 1159  . insulin glargine (LANTUS) injection 28 Units  28 Units Subcutaneous q morning - 10a Sharma Covert, MD   28 Units at 09/19/19 [phone]  . irbesartan (AVAPRO) tablet 300 mg  300 mg Oral Daily Sharma Covert, MD   300 mg at 09/19/19  0732  . lidocaine (PF) (XYLOCAINE) 1 % injection 10 mL  10 mL Infiltration Once Robinson, [place] N, PA-C      . LORazepam (ATIVAN) tablet 1 mg  1 mg Oral TID Sharma Covert, MD   1 mg at 09/19/19 0739  . metFORMIN (GLUCOPHAGE-XR) 24 hr tablet 1,000 mg  1,000 mg Oral Q breakfast Rankin, Shuvon B, NP   1,000 mg at 09/19/19 0733  . miconazole (MICOTIN) 2 % cream   Topical BID Rankin, Shuvon B, NP   Given at 09/18/19 0759  . mirtazapine (REMERON) tablet 30 mg  30 mg Oral QHS Sharma Covert, MD   30 mg at 09/18/19 2131  . pantoprazole (PROTONIX) EC tablet 40 mg  40 mg Oral BID Sharma Covert, MD   40 mg at 09/19/19 4854   PTA Medications: Medications Prior to Admission  Medication Sig Dispense Refill Last Dose  . amantadine (SYMMETREL) 100 MG capsule Take 100 mg by mouth 2 (two) times daily.     . diazepam (VALIUM) 5 MG tablet Take 5-10 mg by mouth 3 (three) times daily as needed for anxiety.     . mirtazapine (REMERON) 15 MG tablet Take 15 mg by mouth at bedtime.     . ARIPiprazole (ABILIFY) 2 MG tablet Take 1 tablet by mouth daily.     Marland Kitchen atorvastatin (LIPITOR) 80 MG  tablet Take 1 tablet (80 mg total) by mouth daily at 6 PM. 30 tablet 1   . Insulin Glargine, 2 Unit Dial, (TOUJEO MAX SOLOSTAR) 300 UNIT/ML SOPN Inject 72 Units into the skin every morning.     . metFORMIN (GLUCOPHAGE-XR) 500 MG 24 hr tablet Take 1,000 mg by mouth 2 (two) times a day.   Not Taking at Unknown time  . olmesartan (BENICAR) 20 MG tablet Take 20 mg by mouth daily.     Marland Kitchen omeprazole (PRILOSEC) 40 MG capsule Take 40 mg by mouth daily.       Patient Stressors: Health problems Medication change or noncompliance  Patient Strengths: Average or above average intelligence General fund of knowledge Motivation for treatment/growth Supportive family/friends  Treatment Modalities: Medication Management, Group therapy, Case management,  1 to 1 session with clinician, Psychoeducation, Recreational  therapy.   Physician Treatment Plan for Primary Diagnosis: MDD (major depressive disorder), recurrent severe, without psychosis ([place]) Long Term Goal(s): Improvement in symptoms so as ready for discharge Improvement in symptoms so as ready for discharge   Short Term Goals: Ability to identify changes in lifestyle to reduce recurrence of condition will improve Ability to verbalize feelings will improve Ability to disclose and discuss suicidal ideas Ability to identify and develop effective coping behaviors will improve Ability to maintain clinical measurements within normal limits will improve Compliance with prescribed medications will improve  Medication Management: Evaluate patient's response, side effects, and tolerance of medication regimen.  Therapeutic Interventions: 1 to 1 sessions, Unit Group sessions and Medication administration.  Evaluation of Outcomes: Not Met  Physician Treatment Plan for Secondary Diagnosis: Principal Problem:   MDD (major depressive disorder), recurrent severe, without psychosis ([place]) Active Problems:   Type 2 diabetes mellitus with diabetic polyneuropathy, with long-term current use of insulin (HCC)   Generalized anxiety disorder   Benzodiazepine dependence, continuous (HCC)   Persistent depressive disorder with anxious distress, currently severe  Long Term Goal(s): Improvement in symptoms so as ready for discharge Improvement in symptoms so as ready for discharge   Short Term Goals: Ability to identify changes in lifestyle to reduce recurrence of condition will improve Ability to verbalize feelings will improve Ability to disclose and discuss suicidal ideas Ability to identify and develop effective coping behaviors will improve Ability to maintain clinical measurements within normal limits will improve Compliance with prescribed medications will improve     Medication Management: Evaluate patient's response, side effects, and tolerance of  medication regimen.  Therapeutic Interventions: 1 to 1 sessions, Unit Group sessions and Medication administration.  Evaluation of Outcomes: Not Met   RN Treatment Plan for Primary Diagnosis: MDD (major depressive disorder), recurrent severe, without psychosis ([place]) Long Term Goal(s): Knowledge of disease and therapeutic regimen to maintain health will improve  Short Term Goals: Ability to participate in decision making will improve, Ability to verbalize feelings will improve, Ability to disclose and discuss suicidal ideas and Ability to identify and develop effective coping behaviors will improve  Medication Management: RN will administer medications as ordered by provider, will assess and evaluate patient's response and provide education to patient for prescribed medication. RN will report any adverse and/or side effects to prescribing provider.  Therapeutic Interventions: 1 on 1 counseling sessions, Psychoeducation, Medication administration, Evaluate responses to treatment, Monitor vital signs and CBGs as ordered, Perform/monitor CIWA, COWS, AIMS and Fall Risk screenings as ordered, Perform wound care treatments as ordered.  Evaluation of Outcomes: Not Met   LCSW Treatment Plan for Primary Diagnosis: MDD (  major depressive disorder), recurrent severe, without psychosis ([place]) Long Term Goal(s): Safe transition to appropriate next level of care at discharge, Engage patient in therapeutic group addressing interpersonal concerns.  Short Term Goals: Engage patient in aftercare planning with referrals and resources  Therapeutic Interventions: Assess for all discharge needs, 1 to 1 time with Social worker, Explore available resources and support systems, Assess for adequacy in community support network, Educate family and significant other(s) on suicide prevention, Complete Psychosocial Assessment, Interpersonal group therapy.  Evaluation of Outcomes: Not Met   Progress in  Treatment: Attending groups: No. New to unit  Participating in groups: No. Taking medication as prescribed: Yes. Toleration medication: Yes. Family/Significant other contact made: Yes, individual(s) contacted:  sister, Helene Kelp Patient understands diagnosis: Yes. Discussing patient identified problems/goals with staff: Yes. Medical problems stabilized or resolved: Yes. Denies suicidal/homicidal ideation: Yes. Issues/concerns per patient self-inventory: No.  New problem(s) identified: Concerns of ability to live independently.  New Short Term/Long Term Goal(s):  medication stabilization, elimination of SI thoughts, development of comprehensive mental wellness plan.   Patient Goals:  "I just want to get better"  Discharge Plan or Barriers: Patient has been referred to [facility], but they are likely out of network. Patient is current with Dr.Kaur. CSW continuing to follow for safe discharge plan. Per Dr. Parke Poisson, MD he wants to refer the patient to Dr. Weber Cooks for ECT. CSW will continue to follow.   Reason for Continuation of Hospitalization: Anxiety Depression Medication stabilization Suicidal ideation  Estimated Length of Stay: 5-7 days  Attendees: Patient: Jeanette Rose 09/19/2019 9:55 AM  Physician: Dr. Myles Lipps, MD; Dr. Neita Garnet, MD 09/19/2019 9:55 AM  Nursing: Rise Paganini.Raliegh Ip, RN; Benjamine Mola.Jenetta Downer RN 09/19/2019 9:55 AM  RN Care Manager: 09/19/2019 9:55 AM  Social Worker: [name], LCSW 09/19/2019 9:55 AM  Recreational Therapist:  09/19/2019 9:55 AM  Other: Harriett Sine, NP 09/19/2019 9:55 AM  Other:  09/19/2019 9:55 AM  Other: 09/19/2019 9:55 AM    Scribe for Treatment Team: Marylee Floras, [place] 09/19/2019 9:55 AM

## 2019-09-19 NOTE — Progress Notes (Signed)
    09/19/19 1300  Psych Admission Type (Psych Patients Only)  Admission Status Voluntary  Psychosocial Assessment  Patient Complaints Depression  Eye Contact Brief  Facial Expression Anxious  Affect Anxious  Speech Logical/coherent  Interaction Minimal  Motor Activity Slow  Appearance/Hygiene Disheveled  Behavior Characteristics Unwilling to participate  Mood Preoccupied  Thought Process  Coherency WDL  Content Obsessions  Delusions None reported or observed  Perception WDL  Hallucination None reported or observed  Judgment Poor  Confusion None  Danger to Self  Current suicidal ideation? Denies  Danger to Others  Danger to Others None reported or observed

## 2019-09-19 NOTE — Progress Notes (Signed)
[facility] MD Progress Note  09/19/2019 11:23 AM Jeanette Rose  MRN:  [id number] Subjective:   Patient continues to report feeling " scared". States " I am scared of everything, even taking a shower". Reports persistent depression and significant anxiety.  Currently denies medication side effects, but reports tremulousness , which has worsened over recent days.    Objective:  I have discussed case with treatment team and have met with patient. 63 year old female, history of depression, anxiety, prior diagnosis of PTSD, GAD, MDD. Presented to hospital on 12/14 reporting worsening symptoms of depression, anxiety and suicidal ideations.  Patient presents depressed and  anxious, intermittently tearful during session. Tends to ruminate on feelings of fearfulness, apprehension, and  perceived inability to function which she attributes in part to her severe anxiety.  Denies suicidal plan or intention at this time and does contract for safety on unit . Reports a subjective feeling of hopelessness and states " nothing seems to help me " ( referring to medications). She also reports a subjective sense of memory loss and decreased cognitive functioning . However , she scored a 27/30 on MMSE - had difficulty mainly on copying a diagram and writing sentence , partly due to tremulousness and anxiety. Memory was actually 3/3 immediate and 3/3 at 4 minutes . I have reviewed chart / history- patient has a history of PTSD related to death of her son years ago, chronic anxiety suggestive of GAD, depression. She had also been on high BZD dose in the past , up to 7 mgrs of Ativan daily, now tapered down to 3 mgrs daily.  During her prior admission in June was transferred to [facility] for inpatient ECT under the supervision of Dr. Weber Cooks. As per ECT notes, patient appears to have improved gradually but later plateauing , with some but not full improvement. Dr. Weber Cooks recommended maintenance ECT but patient states she was unable to  continue ECT s following her discharge. Patient presents tremulous. States she had had some tremors before but not as severe as now . Attributes tremors in part to anxiety.  CBG 110/ 156 today  Principal Problem: MDD (major depressive disorder), recurrent severe, without psychosis ([place]) Diagnosis: Principal Problem:   MDD (major depressive disorder), recurrent severe, without psychosis ([place]) Active Problems:   Type 2 diabetes mellitus with diabetic polyneuropathy, with long-term current use of insulin (H[facility])   Generalized anxiety disorder   Benzodiazepine dependence, continuous ([place])   Persistent depressive disorder with anxious distress, currently severe  Total Time spent with patient: 20 minutes  Past Psychiatric History: See admission H&P  Past Medical History:  Past Medical History:  Diagnosis Date  . Acute acalculous cholecystitis s/p lap chole 04/03/2014 04/02/2014  . Allergy   . Anemia   . Anxiety   . Asthma   . Depression   . Fatty liver    noted on CT per [facility] records  . Fibromyalgia   . GERD (gastroesophageal reflux disease)   . Hyperlipidemia associated with type 2 diabetes mellitus ([place])   . Hypertension   . Hypothyroid 2015   Transiently in 2015. Synthroid 58mg was stopped 05/07/2016 w/ tsh nml following.  . Obesity (BMI 30-39.9)   . OSA (obstructive sleep apnea)    non-compliant with CPAP  . Seborrheic dermatitis    [facility]  . Stroke ([facility]    TIA  . Type 2 diabetes mellitus ([place]     Past Surgical History:  Procedure Laterality Date  .  BREAST REDUCTION SURGERY  06/2008  . CHOLECYSTECTOMY N/A 04/03/2014   Procedure: LAPAROSCOPIC CHOLECYSTECTOMY ;  Surgeon: Adin Hector, MD;  Location: [facility] ORS;  Service: General;  Laterality: N/A;  . REDUCTION MAMMAPLASTY    . TUBAL LIGATION     Family History:  Family History  Problem Relation Age of Onset  . Atrial fibrillation Mother   . Heart disease Father        CAD  .  Diabetes Brother   . Cancer Maternal Grandmother   . Diabetes Paternal Grandmother   . Fibromyalgia Sister   . Colitis Sister    Family Psychiatric  History: See admission H&P Social History:  Social History   Substance and Sexual Activity  Alcohol Use No     Social History   Substance and Sexual Activity  Drug Use No    Social History   Socioeconomic History  . Marital status: Divorced    Spouse name: Not on file  . Number of children: Not on file  . Years of education: Not on file  . Highest education level: Not on file  Occupational History  . Not on file  Tobacco Use  . Smoking status: Former Smoker    Packs/day: 4.00    Years: 15.00    Pack years: 60.00    Types: Cigarettes    Quit date: 04/22/2004    Years since quitting: 15.4  . Smokeless tobacco: Never Used  . Tobacco comment: 4 pack year hx  Substance and Sexual Activity  . Alcohol use: No  . Drug use: No  . Sexual activity: Never  Other Topics Concern  . Not on file  Social History Narrative  . Not on file   Social Determinants of Health   Financial Resource Strain:   . Difficulty of Paying Living Expenses: Not on file  Food Insecurity:   . Worried About Charity fundraiser in the Last Year: Not on file  . Ran Out of Food in the Last Year: Not on file  Transportation Needs:   . Lack of Transportation (Medical): Not on file  . Lack of Transportation (Non-Medical): Not on file  Physical Activity:   . Days of Exercise per Week: Not on file  . Minutes of Exercise per Session: Not on file  Stress:   . Feeling of Stress : Not on file  Social Connections:   . Frequency of Communication with Friends and Family: Not on file  . Frequency of Social Gatherings with Friends and Family: Not on file  . Attends Religious Services: Not on file  . Active Member of Clubs or Organizations: Not on file  . Attends Archivist Meetings: Not on file  . Marital Status: Not on file   Additional Social  History:   Sleep: Fair/ improving   Appetite:  Fair  Current Medications: Current Facility-Administered Medications  Medication Dose Route Frequency Provider Last Rate Last Admin  . aspirin EC tablet 81 mg  81 mg Oral Daily Rankin, Shuvon B, NP   81 mg at 09/19/19 0731  . atorvastatin (LIPITOR) tablet 80 mg  80 mg Oral QHS Rankin, Shuvon B, NP   80 mg at 09/18/19 2131  . benzonatate (TESSALON) capsule 200 mg  200 mg Oral TID PRN Sharma Covert, MD   200 mg at 09/19/19 0037  . busPIRone (BUSPAR) tablet 10 mg  10 mg Oral TID Byrd Rushlow, Myer Peer, MD   10 mg at 09/19/19 1119  . cephALEXin (KEFLEX) capsule  500 mg  500 mg Oral Q12H Sharma Covert, MD   500 mg at 09/19/19 0731  . docusate sodium (COLACE) capsule 100 mg  100 mg Oral BID Rankin, Shuvon B, NP   100 mg at 09/19/19 0732  . [START ON 09/20/2019] DULoxetine (CYMBALTA) DR capsule 60 mg  60 mg Oral Daily Sariya Trickey A, MD      . fluvoxaMINE (LUVOX) tablet 50 mg  50 mg Oral QHS Sharma Covert, MD   50 mg at 09/18/19 2131  . insulin aspart (novoLOG) injection 0-9 Units  0-9 Units Subcutaneous TID WC Rankin, Shuvon B, NP   1 Units at 09/18/19 1159  . insulin glargine (LANTUS) injection 28 Units  28 Units Subcutaneous q morning - 10a Sharma Covert, MD   28 Units at 09/19/19 [id number]  . irbesartan (AVAPRO) tablet 300 mg  300 mg Oral Daily Sharma Covert, MD   300 mg at 09/19/19 0732  . lidocaine (PF) (XYLOCAINE) 1 % injection 10 mL  10 mL Infiltration Once Robinson, [place] N, PA-C      . LORazepam (ATIVAN) tablet 1 mg  1 mg Oral TID Sharma Covert, MD   1 mg at 09/19/19 1119  . metFORMIN (GLUCOPHAGE-XR) 24 hr tablet 1,000 mg  1,000 mg Oral Q breakfast Rankin, Shuvon B, NP   1,000 mg at 09/19/19 0733  . miconazole (MICOTIN) 2 % cream   Topical BID Rankin, Shuvon B, NP   Given at 09/18/19 0759  . mirtazapine (REMERON) tablet 15 mg  15 mg Oral QHS Gurman Ashland A, MD      . pantoprazole (PROTONIX) EC tablet 40 mg  40 mg  Oral BID Sharma Covert, MD   40 mg at 09/19/19 5170    Lab Results:  Results for orders placed or performed during the hospital encounter of 09/11/19 (from the past 48 hour(s))  Glucose, capillary     Status: Abnormal   Collection Time: 09/17/19 11:43 AM  Result Value Ref Range   Glucose-Capillary 178 (H) 70 - 99 mg/dL  Glucose, capillary     Status: None   Collection Time: 09/17/19  5:00 PM  Result Value Ref Range   Glucose-Capillary 88 70 - 99 mg/dL  Glucose, capillary     Status: Abnormal   Collection Time: 09/17/19  8:43 PM  Result Value Ref Range   Glucose-Capillary 189 (H) 70 - 99 mg/dL  Glucose, capillary     Status: Abnormal   Collection Time: 09/18/19  6:17 AM  Result Value Ref Range   Glucose-Capillary 141 (H) 70 - 99 mg/dL  Glucose, capillary     Status: Abnormal   Collection Time: 09/18/19 11:53 AM  Result Value Ref Range   Glucose-Capillary 145 (H) 70 - 99 mg/dL  Glucose, capillary     Status: Abnormal   Collection Time: 09/18/19  4:46 PM  Result Value Ref Range   Glucose-Capillary 103 (H) 70 - 99 mg/dL   Comment 1 Notify RN    Comment 2 Document in Chart   Glucose, capillary     Status: Abnormal   Collection Time: 09/18/19  7:57 PM  Result Value Ref Range   Glucose-Capillary 203 (H) 70 - 99 mg/dL  Glucose, capillary     Status: Abnormal   Collection Time: 09/19/19  5:51 AM  Result Value Ref Range   Glucose-Capillary 110 (H) 70 - 99 mg/dL   Comment 1 Notify RN    Comment 2 Document in Chart  Glucose, capillary     Status: Abnormal   Collection Time: 09/19/19 11:20 AM  Result Value Ref Range   Glucose-Capillary 156 (H) 70 - 99 mg/dL   Comment 1 Notify RN    Comment 2 Document in Chart     Blood Alcohol level:  Lab Results  Component Value Date   ETH <10 09/10/2019   ETH <10 38/18/2993    Metabolic Disorder Labs: Lab Results  Component Value Date   HGBA1C 9.9 (H) 09/12/2019   MPG 237.43 09/12/2019   MPG 134.11 03/09/2019   No results  found for: PROLACTIN Lab Results  Component Value Date   CHOL 174 10/09/2018   TRIG 210 (H) 10/09/2018   HDL 44 10/09/2018   CHOLHDL 4.0 10/09/2018   LDLCALC 88 10/09/2018   LDLCALC 77 06/28/2018    Physical Findings: AIMS: Facial and Oral Movements Muscles of Facial Expression: None, normal Lips and Perioral Area: None, normal Jaw: None, normal Tongue: None, normal,Extremity Movements Upper (arms, wrists, hands, fingers): None, normal Lower (legs, knees, ankles, toes): None, normal, Trunk Movements Neck, shoulders, hips: None, normal, Overall Severity Severity of abnormal movements (highest score from questions above): None, normal Incapacitation due to abnormal movements: None, normal Patient's awareness of abnormal movements (rate only patient's report): No Awareness, Dental Status Current problems with teeth and/or dentures?: No Does patient usually wear dentures?: No  CIWA:  CIWA-Ar Total: 1 COWS:  COWS Total Score: 2  Musculoskeletal: Strength & Muscle Tone: within normal limits Gait & Station: normal Patient leans: N/A  Psychiatric Specialty Exam: Physical Exam  Nursing note and vitals reviewed. Constitutional: She is oriented to person, place, and time. She appears well-developed and well-nourished.  HENT:  Head: Normocephalic and atraumatic.  Respiratory: Effort normal.  Neurological: She is alert and oriented to person, place, and time.    Review of Systems no chest pain, no shortness of breath, no vomiting , reports feeling tremulous.  Blood pressure 120/69, pulse (!) 114, temperature 97.8 F (36.6 C), resp. rate 16, height 5' 5.5" (1.664 m), weight 77.6 kg, SpO2 100 %.Body mass index is 28.02 kg/m.  General Appearance: Fairly Groomed  Eye Contact:  Good  Speech:  Normal Rate  Volume:  Normal  Mood:  Depressed and anxious   Affect:  remains constricted, anxious, ruminative   Thought Process:  Linear and Descriptions of Associations: Circumstantial   Orientation:  Other:  fully alert and attentive  Thought Content:  no hallucinations, no delusions, anxious ruminations   Suicidal Thoughts:  No currently denies suicidal plan or intention   Homicidal Thoughts:  No  Memory:  3/3 immediate, 3/3 at 4 minutes . Scored 27/30 on MMSE   Judgement:  Fair  Insight:  Fair  Psychomotor Activity: no psychomotor agitation , some pacing noted, which she attributes to anxiety  Concentration:  Concentration: Fair and Attention Span: Fair  Recall:  [employer] of Knowledge:  Fair  Language:  Fair  Akathisia:  Negative  Handed:  Right  AIMS (if indicated):     Assets:  Desire for Improvement Resilience  ADL's:  Impaired  Cognition:  WNL  Sleep:  Number of Hours: 6   Assessment :  63 year old female, history of depression, anxiety, prior diagnosis of PTSD, GAD, MDD. Presented to hospital on 12/14 reporting worsening symptoms of depression, anxiety and suicidal ideations.  Patient presents unchanged , with persistent depression and in particular anxiety symptoms. Describes symptoms such as vague but severe apprehension, a subjective sense  of fear, feeling pessimistic and hopeless .Currently denies SI. States she is not sure how much current medication regimen may be working . States she did well in the past with Cymbalta and high dose Ativan, but states Cymbalta seems to have stopped working for her.  Presents with some anxiety, tremulousness. She is on several psychiatric medications with serotonin modulating properties- currently no clear symptoms of serotonin syndrome - no fever, no stiffness or rigidity, no clonus or twitches/fasciculations noted , no confusion and is alert,attentive, 0x 3 . Does report significant tremors, which she states have worsened with medication dosages being increased . At this time plan is to gradually taper off Cymbalta ( which had been tried at a dose of up to 90 mgrs BID) as patient feels no longer working or effective. She  has been started on Luvox trial, which she is tolerating well so far . Will taper Remeron/ Buspar down to minimize risk of side effects and gradually simplify treatment regimen. Patient may benefit from ECT , and had some response to this treatment modality earlier this year. She reports she will consider ECT but is apprehensive and ambivalent about it .  Treatment Plan Summary: Treatment Plan reviewed as below today 12/23  Encourage group and milieu participation Continue coated aspirin 81 mg p.o. daily. Continue Lipitor 80 mg p.o. nightly for hyperlipidemia. Decrease BuSpar  To 10 mg p.o. 3 times daily for anxiety. Continue Keflex 500 mg p.o. every 12 hours as needed for possible infection from impalement of pencil. Decrease  Cymbalta to 60 mg p.o. daily - for anxiety and depression. Wean off gradually as tolerated  Continue sliding scale insulin. Continue  Lantus insulin  28 units subcu q. a.m. for diabetes mellitus. Continue  Avapro  300 mg p.o. daily for hypertension. Continue Lorazepam 1 mg p.o. 3 times daily for anxiety. Continue Metformin XR 1000 mg p.o. daily for diabetes mellitus. Decrease  Mirtazapine to 15 mg p.o. nightly for anxiety and depression. Continue Protonix 40 mg p.o. daily for reflux. Continue Luvox 50 mg p.o. nightly for anxiety and depression. Disposition planning-in progress. Consult Dr. Weber Cooks at [facility] to determine whether patient may be appropriate candidate for further ECT management.  Jenne Campus, MD 09/19/2019, 11:23 AM   Patient ID: Jeanette Rose, female   DOB: Oct 21, 1955, 63 y.o.   MRN: [id number]

## 2019-09-19 NOTE — Progress Notes (Signed)
Per Dr. Parke Poisson, MD request, CSW referred the patient for possible ECT services at [facility] with Dr. Alethia Berthold.   CSW will continue to follow for possible acceptance.   [name], MSW, LCSW Clinical Social Worker [facility]  Phone: [phone]

## 2019-09-19 NOTE — Progress Notes (Signed)
   09/19/19 2106  Psych Admission Type (Psych Patients Only)  Admission Status Voluntary  Psychosocial Assessment  Patient Complaints Depression;Helplessness  Eye Contact Brief  Facial Expression Anxious  Affect Anxious  Speech Logical/coherent  Interaction Minimal  Motor Activity Slow  Appearance/Hygiene Disheveled  Behavior Characteristics Unwilling to participate  Mood Depressed;Preoccupied  Thought Process  Coherency WDL  Content Obsessions  Delusions None reported or observed  Perception WDL  Hallucination None reported or observed  Judgment Poor  Confusion None  Danger to Self  Current suicidal ideation? Denies  Danger to Others  Danger to Others None reported or observed  D: Encouraged group participation but refused.  Pt appears depressed and unmotivated stating "she feels "overwhelmed and afraid". A: Medications administered as prescribed. Support and encouragement provided as needed.  R: Patient remains safe on the unit. Will continue to monitor for safety and stability.

## 2019-09-20 LAB — GLUCOSE, CAPILLARY
Glucose-Capillary: 157 mg/dL — ABNORMAL HIGH (ref 70–99)
Glucose-Capillary: 148 mg/dL — ABNORMAL HIGH (ref 70–99)
Glucose-Capillary: 106 mg/dL — ABNORMAL HIGH (ref 70–99)
Glucose-Capillary: 200 mg/dL — ABNORMAL HIGH (ref 70–99)

## 2019-09-20 MED ORDER — BUSPIRONE HCL 5 MG PO TABS
5.0000 mg | ORAL_TABLET | Freq: Three times a day (TID) | ORAL | Status: AC
  Administered 2019-09-20 – 2019-09-21 (×5): 5 mg via ORAL
  Filled 2019-09-20 (×6): qty 1

## 2019-09-20 MED ORDER — ILOPERIDONE 2 MG PO TABS
2.0000 mg | ORAL_TABLET | Freq: Two times a day (BID) | ORAL | Status: DC
  Administered 2019-09-20 – 2019-09-27 (×15): 2 mg via ORAL
  Filled 2019-09-20 (×22): qty 1

## 2019-09-20 MED ORDER — DULOXETINE HCL 20 MG PO CPEP
40.0000 mg | ORAL_CAPSULE | Freq: Every day | ORAL | Status: DC
  Administered 2019-09-21 – 2019-09-23 (×3): 40 mg via ORAL
  Filled 2019-09-20 (×6): qty 2

## 2019-09-20 NOTE — Progress Notes (Signed)
Psychoeducational Group Note  Date:  09/20/2019 Time:  2043  Group Topic/Focus:  Wrap-Up Group:   The focus of this group is to help patients review their daily goal of treatment and discuss progress on daily workbooks.  Participation Level: Did Not Attend  Participation Quality:  Not Applicable  Affect:  Not Applicable  Cognitive:  Not Applicable  Insight:  Not Applicable  Engagement in Group: Not Applicable  Additional Comments:  The patient politely refused to attend group this evening.   Archie Balboa S 09/20/2019, 8:43 PM

## 2019-09-20 NOTE — Progress Notes (Signed)
    09/20/19 1000  Psych Admission Type (Psych Patients Only)  Admission Status Voluntary  Psychosocial Assessment  Patient Complaints Anxiety  Eye Contact Brief  Facial Expression Anxious  Affect Anxious  Speech Logical/coherent  Interaction Minimal  Motor Activity Slow  Appearance/Hygiene Disheveled  Behavior Characteristics Cooperative  Mood Depressed;Anxious  Thought Process  Coherency WDL  Content Obsessions  Delusions None reported or observed  Perception WDL  Hallucination None reported or observed  Judgment Poor  Confusion None  Danger to Self  Current suicidal ideation? Denies  Danger to Others  Danger to Others None reported or observed

## 2019-09-20 NOTE — Progress Notes (Addendum)
[facility] MD Progress Note  09/20/2019 3:15 PM Jeanette Rose  MRN:  [id number] Subjective:   Patient reports ongoing feelings of anxiety and "fear".  Makes statements such as "I am afraid of doing anything", "I don't  think I can do anything by myself anymore".  She worries about "feeling weak" and reports bilateral arm weakness .   She denies medication side effects at this time.   Denies suicidal plans or intention and contracts for safety at this time, does endorse intermittent passive thoughts of death.       Objective:  I have discussed case with treatment team and have met with patient. 63 year old female, history of depression, anxiety, prior diagnosis of PTSD, GAD, MDD. Presented to hospital on 12/14 reporting worsening symptoms of depression, anxiety and suicidal ideations.  Patient remains significantly anxious, ruminative, with a labile affect.  Today there may be  limited improvement compared to yesterday-was not tearful during interaction today and a little better able to focus on treatment and disposition planning options. I have asked my colleague, Dr. Jake Samples, to evaluate patient, and have reviewed current medication regimen with him and with unit pharmacist as well.  We are currently tapering Cymbalta gradually as symptoms persisted in spite of dose increase up to 180 mg daily, and may have experienced increased activation at  higher doses.  Luvox was recently added, is currently at 50 mg daily and is tolerating well thus far.  She is also on Ativan at 1 mg 3 times daily.  Of note patient has been on benzodiazepines for years.   Fanapt trial considered as an option to help address persistent/chronic symptoms of anxiety.  Milieu participation has been limited, visible in hallway for medications, meals. Denies medication side effects at this time. Has endorsed intermittent passive thoughts of death but currently denies suicidal plan or intention and contracts for safety on unit. With  patient's express consent I spoke with her sister Jeanette Rose) at [phone].  Sister confirms patient has a long and protracted history of anxiety disorder which has been present for years but which has tended to worsen.  Symptoms worsened recently in the context of patient currently being in the process of having to move out of her  home.  Sister states that "her home was a mess, like you see with hoarders".  She states that patient did well in the past with a combination of Cymbalta and Ativan and that patient has been on Ativan for years, but that she seemed to worsen after a period of time, with less response to medications. She also confirms patient has reported subjective upper extremity weakness related to having  " pinched nerve on her neck "  Dr. Weber Cooks was consulted for possible ECT ( Patient had ECT trial earlier this year at [facility]) . Dr. Weber Cooks reports that : She was extremely anxious during her last set of ECT treatments and very concerned about side effects. If I recall, she had some difficulty with transportation. We will not be doing any ECT treatments until the new year as the nurses taking next week off. At this time patient remains ambivalent about ECT , but states she will consider it " if I don't get any better" with current medication adjustments .   Principal Problem: MDD (major depressive disorder), recurrent severe, without psychosis ([place]) Diagnosis: Principal Problem:   MDD (major depressive disorder), recurrent severe, without psychosis ([place]) Active Problems:   Type 2 diabetes mellitus with diabetic polyneuropathy, with long-term current use  of insulin (HCC)   Generalized anxiety disorder   Benzodiazepine dependence, continuous (HCC)   Persistent depressive disorder with anxious distress, currently severe  Total Time spent with patient: 20 minutes  Past Psychiatric History: See admission H&P  Past Medical History:  Past Medical History:  Diagnosis Date  . Acute  acalculous cholecystitis s/p lap chole 04/03/2014 04/02/2014  . Allergy   . Anemia   . Anxiety   . Asthma   . Depression   . Fatty liver    noted on CT [facility] records  . Fibromyalgia   . GERD (gastroesophageal reflux disease)   . Hyperlipidemia associated with type 2 diabetes mellitus ([place])   . Hypertension   . Hypothyroid 2015   Transiently in 2015. Synthroid 80mg was stopped 05/07/2016 w/ tsh nml following.  . Obesity (BMI 30-39.9)   . OSA (obstructive sleep apnea)    non-compliant with CPAP  . Seborrheic dermatitis    [facility]  . St[facility] ([facility]    TIA  . Type 2 diabetes mellitus ([place]     Past Surgical History:  Procedure Laterality Date  . BREAST REDUCTION SURGERY  06/2008  . CHOLECYSTECTOMY N/A 04/03/2014   Procedure: LAPAROSCOPIC CHOLECYSTECTOMY ;  Surgeon: SAdin Hector MD;  Location: [facility] ORS;  Service: General;  Laterality: N/A;  . REDUCTION MAMMAPLASTY    . TUBAL LIGATION     Family History:  Family History  Problem Relation Age of Onset  . Atrial fibrillation Mother   . Heart disease Father        CAD  . Diabetes Brother   . Cancer Maternal Grandmother   . Diabetes Paternal Grandmother   . Fibromyalgia Sister   . Colitis Sister    Family Psychiatric  History: See admission H&P Social History:  Social History   Substance and Sexual Activity  Alcohol Use No     Social History   Substance and Sexual Activity  Drug Use No    Social History   Socioeconomic History  . Marital status: Divorced    Spouse name: Not on file  . Number of children: Not on file  . Years of education: Not on file  . Highest education level: Not on file  Occupational History  . Not on file  Tobacco Use  . Smoking status: Former Smoker    Packs/day: 4.00    Years: 15.00    Pack years: 60.00    Types: Cigarettes    Quit date: 04/22/2004    Years since quitting: 15.4  . Smokeless tobacco: Never Used  . Tobacco comment: 4 pack year  hx  Substance and Sexual Activity  . Alcohol use: No  . Drug use: No  . Sexual activity: Never  Other Topics Concern  . Not on file  Social History Narrative  . Not on file   Social Determinants of Health   Financial Resource Strain:   . Difficulty of Paying Living Expenses: Not on file  Food Insecurity:   . Worried About RCharity fundraiserin the Last Year: Not on file  . Ran Out of Food in the Last Year: Not on file  Transportation Needs:   . Lack of Transportation (Medical): Not on file  . Lack of Transportation (Non-Medical): Not on file  Physical Activity:   . Days of Exercise per Week: Not on file  . Minutes of Exercise per Session: Not on file  Stress:   . Feeling of Stress :  Not on file  Social Connections:   . Frequency of Communication with Friends and Family: Not on file  . Frequency of Social Gatherings with Friends and Family: Not on file  . Attends Religious Services: Not on file  . Active Member of Clubs or Organizations: Not on file  . Attends Archivist Meetings: Not on file  . Marital Status: Not on file   Additional Social History:   Sleep: Fair/ improving   Appetite:  Fair  Current Medications: Current Facility-Administered Medications  Medication Dose Route Frequency Provider Last Rate Last Admin  . aspirin EC tablet 81 mg  81 mg Oral Daily Rankin, Shuvon B, NP   81 mg at 09/20/19 0817  . atorvastatin (LIPITOR) tablet 80 mg  80 mg Oral QHS Rankin, Shuvon B, NP   80 mg at 09/19/19 2143  . benzonatate (TESSALON) capsule 200 mg  200 mg Oral TID PRN Sharma Covert, MD   200 mg at 09/19/19 2143  . busPIRone (BUSPAR) tablet 5 mg  5 mg Oral TID Halsey Hammen, Myer Peer, MD   5 mg at 09/20/19 1142  . cephALEXin (KEFLEX) capsule 500 mg  500 mg Oral Q12H Sharma Covert, MD   500 mg at 09/20/19 0817  . docusate sodium (COLACE) capsule 100 mg  100 mg Oral BID Rankin, Shuvon B, NP   100 mg at 09/20/19 0817  . DULoxetine (CYMBALTA) DR capsule 60 mg   60 mg Oral Daily Abril Cappiello, Myer Peer, MD   60 mg at 09/20/19 0816  . fluvoxaMINE (LUVOX) tablet 50 mg  50 mg Oral QHS Sharma Covert, MD   50 mg at 09/19/19 2143  . iloperidone (FANAPT) tablet 2 mg  2 mg Oral BID Mohd. Derflinger, Myer Peer, MD   2 mg at 09/20/19 1142  . insulin aspart (novoLOG) injection 0-9 Units  0-9 Units Subcutaneous TID WC Rankin, Shuvon B, NP   1 Units at 09/20/19 1156  . insulin glargine (LANTUS) injection 28 Units  28 Units Subcutaneous q morning - 10a Sharma Covert, MD   28 Units at 09/20/19 1143  . irbesartan (AVAPRO) tablet 300 mg  300 mg Oral Daily Sharma Covert, MD   300 mg at 09/20/19 0816  . LORazepam (ATIVAN) tablet 1 mg  1 mg Oral TID Sharma Covert, MD   1 mg at 09/20/19 1142  . metFORMIN (GLUCOPHAGE-XR) 24 hr tablet 1,000 mg  1,000 mg Oral Q breakfast Rankin, Shuvon B, NP   1,000 mg at 09/20/19 0816  . miconazole (MICOTIN) 2 % cream   Topical BID Rankin, Shuvon B, NP   Given at 09/18/19 0759  . pantoprazole (PROTONIX) EC tablet 40 mg  40 mg Oral BID Sharma Covert, MD   40 mg at 09/20/19 6503    Lab Results:  Results for orders placed or performed during the hospital encounter of 09/11/19 (from the past 48 hour(s))  Glucose, capillary     Status: Abnormal   Collection Time: 09/18/19  4:46 PM  Result Value Ref Range   Glucose-Capillary 103 (H) 70 - 99 mg/dL   Comment 1 Notify RN    Comment 2 Document in Chart   Glucose, capillary     Status: Abnormal   Collection Time: 09/18/19  7:57 PM  Result Value Ref Range   Glucose-Capillary 203 (H) 70 - 99 mg/dL  Glucose, capillary     Status: Abnormal   Collection Time: 09/19/19  5:51 AM  Result Value Ref  Range   Glucose-Capillary 110 (H) 70 - 99 mg/dL   Comment 1 Notify RN    Comment 2 Document in Chart   Glucose, capillary     Status: Abnormal   Collection Time: 09/19/19 11:20 AM  Result Value Ref Range   Glucose-Capillary 156 (H) 70 - 99 mg/dL   Comment 1 Notify RN    Comment 2 Document in  Chart   Glucose, capillary     Status: Abnormal   Collection Time: 09/19/19  4:44 PM  Result Value Ref Range   Glucose-Capillary 101 (H) 70 - 99 mg/dL   Comment 1 Notify RN    Comment 2 Document in Chart   Glucose, capillary     Status: Abnormal   Collection Time: 09/19/19  9:14 PM  Result Value Ref Range   Glucose-Capillary 152 (H) 70 - 99 mg/dL   Comment 1 Notify RN    Comment 2 Document in Chart   Glucose, capillary     Status: Abnormal   Collection Time: 09/20/19  6:01 AM  Result Value Ref Range   Glucose-Capillary 157 (H) 70 - 99 mg/dL   Comment 1 Notify RN    Comment 2 Document in Chart   Glucose, capillary     Status: Abnormal   Collection Time: 09/20/19 11:45 AM  Result Value Ref Range   Glucose-Capillary 148 (H) 70 - 99 mg/dL   Comment 1 Notify RN    Comment 2 Document in Chart     Blood Alcohol level:  Lab Results  Component Value Date   ETH <10 09/10/2019   ETH <10 95/18/8416    Metabolic Disorder Labs: Lab Results  Component Value Date   HGBA1C 9.9 (H) 09/12/2019   MPG 237.43 09/12/2019   MPG 134.11 03/09/2019   No results found for: PROLACTIN Lab Results  Component Value Date   CHOL 174 10/09/2018   TRIG 210 (H) 10/09/2018   HDL 44 10/09/2018   CHOLHDL 4.0 10/09/2018   LDLCALC 88 10/09/2018   LDLCALC 77 06/28/2018    Physical Findings: AIMS: Facial and Oral Movements Muscles of Facial Expression: None, normal Lips and Perioral Area: None, normal Jaw: None, normal Tongue: None, normal,Extremity Movements Upper (arms, wrists, hands, fingers): None, normal Lower (legs, knees, ankles, toes): None, normal, Trunk Movements Neck, shoulders, hips: None, normal, Overall Severity Severity of abnormal movements (highest score from questions above): None, normal Incapacitation due to abnormal movements: None, normal Patient's awareness of abnormal movements (rate only patient's report): No Awareness, Dental Status Current problems with teeth and/or  dentures?: No Does patient usually wear dentures?: No  CIWA:  CIWA-Ar Total: 1 COWS:  COWS Total Score: 2  Musculoskeletal: Strength & Muscle Tone: within normal limits- no distal tremors noted today Gait & Station: normal Patient leans: N/A  Psychiatric Specialty Exam: Physical Exam  Nursing note and vitals reviewed. Constitutional: She is oriented to person, place, and time. She appears well-developed and well-nourished.  HENT:  Head: Normocephalic and atraumatic.  Respiratory: Effort normal.  Neurological: She is alert and oriented to person, place, and time.    Review of Systems reports feeling " weak", denies chest pain , no shortness of breath at room air , no vomiting  Blood pressure 105/67, pulse (!) 110, temperature (!) 97.5 F (36.4 C), resp. rate 16, height 5' 5.5" (1.664 m), weight 77.6 kg, SpO2 100 %.Body mass index is 28.02 kg/m.  General Appearance: Fairly Groomed  Eye Contact:  Good  Speech:  Normal Rate  Volume:  Normal  Mood:  remains depressed, anxious   Affect:  congruent, anxious   Thought Process:  Linear and Descriptions of Associations: Circumstantial  Orientation:  Other:  fully alert and attentive  Thought Content:  no hallucinations, no delusions expressed, ruminative   Suicidal Thoughts:  Yes.  without intent/plan  currently denies suicidal plan or intention and contracts for safety on unit   Homicidal Thoughts:  No  Memory:  recent and remote grossly intact   Judgement:  Fair  Insight:  Fair  Psychomotor Activity: no psychomotor agitation , some pacing noted, which she attributes to anxiety  Concentration:  Concentration: Fair and Attention Span: Fair  Recall:  [employer] of Knowledge:  Fair  Language:  Fair  Akathisia:  Negative  Handed:  Right  AIMS (if indicated):     Assets:  Desire for Improvement Resilience  ADL's:  Impaired  Cognition:  WNL  Sleep:  Number of Hours: 6.5   Assessment :  63 year old female, history of depression,  anxiety, prior diagnosis of PTSD, GAD, MDD. Presented to hospital on 12/14 reporting worsening symptoms of depression, anxiety and suicidal ideations.  Patient remains anxious, fearful, ruminative, and expressing apprehension about her ability to function independently following discharge. Tolerating medications well but clinical improvement has been limited . I have reviewed her case and management with Dr. Jake Samples ( colleague) and Jiles Garter ( pharmacist). Current plan is to gradually taper Cymbalta which she has responded well to in the past but was felt not be working recently, and cross tapering with Luvox . Fanapt is also being considered to help address severe/chronic anxiety /fearful ruminations.   Treatment Plan Summary: Treatment Plan reviewed as below today 12/24  Encourage group and milieu participation Continue coated aspirin 81 mg p.o. daily. Continue Lipitor 80 mg p.o. nightly for hyperlipidemia. Decrease BuSpar  To 5 mg p.o. 3 times daily for anxiety. Continue Keflex 500 mg p.o. every 12 hours as needed for possible infection from impalement of pencil. Decrease Cymbalta to 40  mg p.o. daily - for anxiety and depression. Wean off gradually as tolerated  Continue sliding scale insulin. Continue  Lantus insulin  28 units subcu q. a.m. for diabetes mellitus. Continue  Avapro  300 mg p.o. daily for hypertension. Continue Lorazepam 1 mg p.o. 3 times daily for anxiety. Continue Metformin XR 1000 mg p.o. daily for diabetes mellitus. Discontinue Remeron Start Fanapt 2 mgrs BID for mood/anxiety- side effects reviewed with patient . Continue Protonix 40 mg p.o. daily for reflux. Continue Luvox 50 mg p.o. nightly for anxiety and depression. Check EKG to monitor QTc and BMP to screen for hyponatremia Disposition planning-in progress. Would consider [place] if possible .    Jenne Campus, MD 09/20/2019, 3:15 PM   Patient ID: Jeanette Rose, female   DOB: 02-26-1956, 63 y.o.    MRN: [id number]

## 2019-09-21 LAB — GLUCOSE, CAPILLARY
Glucose-Capillary: 153 mg/dL — ABNORMAL HIGH (ref 70–99)
Glucose-Capillary: 201 mg/dL — ABNORMAL HIGH (ref 70–99)
Glucose-Capillary: 142 mg/dL — ABNORMAL HIGH (ref 70–99)
Glucose-Capillary: 157 mg/dL — ABNORMAL HIGH (ref 70–99)

## 2019-09-21 LAB — BASIC METABOLIC PANEL
Anion gap: 10 (ref 5–15)
BUN: 27 mg/dL — ABNORMAL HIGH (ref 8–23)
CO2: 24 mmol/L (ref 22–32)
Calcium: 9.3 mg/dL (ref 8.9–10.3)
Chloride: 104 mmol/L (ref 98–111)
Creatinine, Ser: 0.82 mg/dL (ref 0.44–1.00)
GFR calc Af Amer: >60 mL/min (ref >60)
GFR calc non Af Amer: >60 mL/min (ref >60)
Glucose, Bld: 185 mg/dL — ABNORMAL HIGH (ref 70–99)
Potassium: 3.9 mmol/L (ref 3.5–5.1)
Sodium: 138 mmol/L (ref 135–145)

## 2019-09-21 NOTE — Progress Notes (Signed)
Recreation Therapy Notes  Date: 12.25.20 Time: 0930-1015 Location: [address] Group Room  Group Topic: Self-Expression  Goal Area(s) Addresses:  Patient will successfully identify positive attributes about themselves.  Patient will successfully identify benefit of improved self-esteem.   Intervention: Statistician, Music  Activity: Christmas Art.  Patients had the option of making Christmas card or any festive Christmas art of their choosing.  Education:  Self-Esteem, Dentist.   Education Outcome: Acknowledges education/In group clarification offered/Needs additional education  Clinical Observations/Feedback: Pt did not participate in activity.     Victorino Sparrow, LRT/CTRS    Victorino Sparrow A 09/21/2019 12:16 PM

## 2019-09-21 NOTE — [facility] Group Notes (Signed)
Pt did not attend wrap up group this evening. Pt was in their bed resting.

## 2019-09-21 NOTE — Plan of Care (Signed)
D: Pt denies SI/HI/AV hallucinations. Pt is pleasant and cooperative. Pt observed lying in bed with eyes open. Patient voices no concerns. A: Pt was offered support and encouragement. Pt was given scheduled medications. Q 15 minute checks were done for safety.  R: Pt is taking medication. Pt has no complaints.Pt receptive to treatment and safety maintained on unit.    Problem: Safety: Goal: Periods of time without injury will increase Outcome: Progressing

## 2019-09-21 NOTE — Progress Notes (Signed)
[facility] MD Progress Note  09/21/2019 8:38 AM Kiera Hussey  MRN:  [id number] Subjective:  "I feel weak."  Ms. Creedon found lying in bed. She continues to present with somatic complaints. She complains of feeling generalized weakness and fatigue "like I can't walk." Patient has been observed ambulating without difficulty on the unit this morning.   Fanapt was started yesterday for severe, persistent anxiety. Patient does appear significantly less anxious this morning and reports a decrease in her anxiety levels. Affect is less labile, and patient denies any episodes of tearfulness today. When asked about her mood, she states, "I don't know." She denies SI. She does report dry mouth since yesterday. Denies other medication side effects.  Morning CBG 153. EKG reviewed. QTc 434.  From admission H&P: Patient is a 63 year old female with a past psychiatric history significant for major depression, generalized anxiety disorder, posttraumatic stress disorder and components of borderline personality disorder who presented to the [facility] emergency department on 09/10/2019 with suicidal ideation. The patient stated that she felt as though "all I think about his darkness in the end". The patient was significantly distraught during the interview. She was crying throughout.  Principal Problem: MDD (major depressive disorder), recurrent severe, without psychosis ([place]) Diagnosis: Principal Problem:   MDD (major depressive disorder), recurrent severe, without psychosis ([place]) Active Problems:   Type 2 diabetes mellitus with diabetic polyneuropathy, with long-term current use of insulin (HCC)   Generalized anxiety disorder   Benzodiazepine dependence, continuous ([place])   Persistent depressive disorder with anxious distress, currently severe  Total Time spent with patient: 15 minutes  Past Psychiatric History: See admission H&P  Past Medical History:  Past Medical History:  Diagnosis  Date  . Acute acalculous cholecystitis s/p lap chole 04/03/2014 04/02/2014  . Allergy   . Anemia   . Anxiety   . Asthma   . Depression   . Fatty liver    noted on CT per [facility] records  . Fibromyalgia   . GERD (gastroesophageal reflux disease)   . Hyperlipidemia associated with type 2 diabetes mellitus ([place])   . Hypertension   . Hypothyroid 2015   Transiently in 2015. Synthroid 80mg was stopped 05/07/2016 w/ tsh nml following.  . Obesity (BMI 30-39.9)   . OSA (obstructive sleep apnea)    non-compliant with CPAP  . Seborrheic dermatitis    [facility]  . Stroke ([facility]    TIA  . Type 2 diabetes mellitus ([place]     Past Surgical History:  Procedure Laterality Date  . BREAST REDUCTION SURGERY  06/2008  . CHOLECYSTECTOMY N/A 04/03/2014   Procedure: LAPAROSCOPIC CHOLECYSTECTOMY ;  Surgeon: SAdin Hector MD;  Location: [facility] ORS;  Service: General;  Laterality: N/A;  . REDUCTION MAMMAPLASTY    . TUBAL LIGATION     Family History:  Family History  Problem Relation Age of Onset  . Atrial fibrillation Mother   . Heart disease Father        CAD  . Diabetes Brother   . Cancer Maternal Grandmother   . Diabetes Paternal Grandmother   . Fibromyalgia Sister   . Colitis Sister    Family Psychiatric  History: See admission H&P Social History:  Social History   Substance and Sexual Activity  Alcohol Use No     Social History   Substance and Sexual Activity  Drug Use No    Social History   Socioeconomic History  . Marital status: Divorced  Spouse name: Not on file  . Number of children: Not on file  . Years of education: Not on file  . Highest education level: Not on file  Occupational History  . Not on file  Tobacco Use  . Smoking status: Former Smoker    Packs/day: 4.00    Years: 15.00    Pack years: 60.00    Types: Cigarettes    Quit date: 04/22/2004    Years since quitting: 15.4  . Smokeless tobacco: Never Used  . Tobacco  comment: 4 pack year hx  Substance and Sexual Activity  . Alcohol use: No  . Drug use: No  . Sexual activity: Never  Other Topics Concern  . Not on file  Social History Narrative  . Not on file   Social Determinants of Health   Financial Resource Strain:   . Difficulty of Paying Living Expenses: Not on file  Food Insecurity:   . Worried About Charity fundraiser in the Last Year: Not on file  . Ran Out of Food in the Last Year: Not on file  Transportation Needs:   . Lack of Transportation (Medical): Not on file  . Lack of Transportation (Non-Medical): Not on file  Physical Activity:   . Days of Exercise per Week: Not on file  . Minutes of Exercise per Session: Not on file  Stress:   . Feeling of Stress : Not on file  Social Connections:   . Frequency of Communication with Friends and Family: Not on file  . Frequency of Social Gatherings with Friends and Family: Not on file  . Attends Religious Services: Not on file  . Active Member of Clubs or Organizations: Not on file  . Attends Archivist Meetings: Not on file  . Marital Status: Not on file   Additional Social History:                         Sleep: Good  Appetite:  Good  Current Medications: Current Facility-Administered Medications  Medication Dose Route Frequency Provider Last Rate Last Admin  . aspirin EC tablet 81 mg  81 mg Oral Daily Rankin, Shuvon B, NP   81 mg at 09/21/19 0737  . atorvastatin (LIPITOR) tablet 80 mg  80 mg Oral QHS Rankin, Shuvon B, NP   80 mg at 09/20/19 2111  . benzonatate (TESSALON) capsule 200 mg  200 mg Oral TID PRN Sharma Covert, MD   200 mg at 09/19/19 2143  . busPIRone (BUSPAR) tablet 5 mg  5 mg Oral TID Cobos, Myer Peer, MD   5 mg at 09/21/19 0737  . cephALEXin (KEFLEX) capsule 500 mg  500 mg Oral Q12H Sharma Covert, MD   500 mg at 09/21/19 6759  . docusate sodium (COLACE) capsule 100 mg  100 mg Oral BID Rankin, Shuvon B, NP   100 mg at 09/21/19 0738   . DULoxetine (CYMBALTA) DR capsule 40 mg  40 mg Oral Daily Cobos, Myer Peer, MD   40 mg at 09/21/19 0742  . fluvoxaMINE (LUVOX) tablet 50 mg  50 mg Oral QHS Sharma Covert, MD   50 mg at 09/20/19 2111  . iloperidone (FANAPT) tablet 2 mg  2 mg Oral BID Cobos, Myer Peer, MD   2 mg at 09/21/19 0739  . insulin aspart (novoLOG) injection 0-9 Units  0-9 Units Subcutaneous TID WC Rankin, Shuvon B, NP   2 Units at 09/21/19 0600  . insulin  glargine (LANTUS) injection 28 Units  28 Units Subcutaneous q morning - 10a Sharma Covert, MD   28 Units at 09/20/19 1143  . irbesartan (AVAPRO) tablet 300 mg  300 mg Oral Daily Sharma Covert, MD   300 mg at 09/21/19 0736  . LORazepam (ATIVAN) tablet 1 mg  1 mg Oral TID Sharma Covert, MD   1 mg at 09/21/19 9794  . metFORMIN (GLUCOPHAGE-XR) 24 hr tablet 1,000 mg  1,000 mg Oral Q breakfast Rankin, Shuvon B, NP   1,000 mg at 09/21/19 0737  . miconazole (MICOTIN) 2 % cream   Topical BID Rankin, Shuvon B, NP   Given at 09/18/19 0759  . pantoprazole (PROTONIX) EC tablet 40 mg  40 mg Oral BID Sharma Covert, MD   40 mg at 09/21/19 0600    Lab Results:  Results for orders placed or performed during the hospital encounter of 09/11/19 (from the past 48 hour(s))  Glucose, capillary     Status: Abnormal   Collection Time: 09/19/19 11:20 AM  Result Value Ref Range   Glucose-Capillary 156 (H) 70 - 99 mg/dL   Comment 1 Notify RN    Comment 2 Document in Chart   Glucose, capillary     Status: Abnormal   Collection Time: 09/19/19  4:44 PM  Result Value Ref Range   Glucose-Capillary 101 (H) 70 - 99 mg/dL   Comment 1 Notify RN    Comment 2 Document in Chart   Glucose, capillary     Status: Abnormal   Collection Time: 09/19/19  9:14 PM  Result Value Ref Range   Glucose-Capillary 152 (H) 70 - 99 mg/dL   Comment 1 Notify RN    Comment 2 Document in Chart   Glucose, capillary     Status: Abnormal   Collection Time: 09/20/19  6:01 AM  Result Value Ref  Range   Glucose-Capillary 157 (H) 70 - 99 mg/dL   Comment 1 Notify RN    Comment 2 Document in Chart   Glucose, capillary     Status: Abnormal   Collection Time: 09/20/19 11:45 AM  Result Value Ref Range   Glucose-Capillary 148 (H) 70 - 99 mg/dL   Comment 1 Notify RN    Comment 2 Document in Chart   Glucose, capillary     Status: Abnormal   Collection Time: 09/20/19  5:01 PM  Result Value Ref Range   Glucose-Capillary 106 (H) 70 - 99 mg/dL   Comment 1 Notify RN    Comment 2 Document in Chart   Glucose, capillary     Status: Abnormal   Collection Time: 09/20/19  8:14 PM  Result Value Ref Range   Glucose-Capillary 200 (H) 70 - 99 mg/dL   Comment 1 Notify RN   Glucose, capillary     Status: Abnormal   Collection Time: 09/21/19  5:54 AM  Result Value Ref Range   Glucose-Capillary 153 (H) 70 - 99 mg/dL   Comment 1 Notify RN   Basic metabolic panel     Status: Abnormal   Collection Time: 09/21/19  6:31 AM  Result Value Ref Range   Sodium 138 135 - 145 mmol/L   Potassium 3.9 3.5 - 5.1 mmol/L   Chloride 104 98 - 111 mmol/L   CO2 24 22 - 32 mmol/L   Glucose, Bld 185 (H) 70 - 99 mg/dL   BUN 27 (H) 8 - 23 mg/dL   Creatinine, Ser 0.82 0.44 - 1.00 mg/dL  Calcium 9.3 8.9 - 10.3 mg/dL   GFR calc non Af Amer >60 >60 mL/min   GFR calc Af Amer >60 >60 mL/min   Anion gap 10 5 - 15    Comment: Performed at [facility] [facility], [place] [address] [place],  [address]    Blood Alcohol level:  Lab Results  Component Value Date   [facility] <10 09/10/2019   ETH <10 10/62/6948    Metabolic Disorder Labs: Lab Results  Component Value Date   HGBA1C 9.9 (H) 09/12/2019   MPG 237.43 09/12/2019   MPG 134.11 03/09/2019   No results found for: PROLACTIN Lab Results  Component Value Date   CHOL 174 10/09/2018   TRIG 210 (H) 10/09/2018   HDL 44 10/09/2018   CHOLHDL 4.0 10/09/2018   LDLCALC 88 10/09/2018   LDLCALC 77 06/28/2018    Physical Findings: AIMS: Facial and Oral  Movements Muscles of Facial Expression: None, normal Lips and Perioral Area: None, normal Jaw: None, normal Tongue: None, normal,Extremity Movements Upper (arms, wrists, hands, fingers): None, normal Lower (legs, knees, ankles, toes): None, normal, Trunk Movements Neck, shoulders, hips: None, normal, Overall Severity Severity of abnormal movements (highest score from questions above): None, normal Incapacitation due to abnormal movements: None, normal Patient's awareness of abnormal movements (rate only patient's report): No Awareness, Dental Status Current problems with teeth and/or dentures?: No Does patient usually wear dentures?: No  CIWA:  CIWA-Ar Total: 1 COWS:  COWS Total Score: 2  Musculoskeletal: Strength & Muscle Tone: within normal limits Gait & Station: normal Patient leans: N/A  Psychiatric Specialty Exam: Physical Exam  Nursing note and vitals reviewed. Constitutional: She is oriented to person, place, and time. She appears well-developed and well-nourished.  Respiratory: Effort normal.  Musculoskeletal:        General: Normal range of motion.  Neurological: She is alert and oriented to person, place, and time.    Review of Systems  Constitutional: Negative.   Respiratory: Negative for cough and shortness of breath.   Gastrointestinal: Negative for nausea and vomiting.  Psychiatric/Behavioral: Negative for agitation, behavioral problems, hallucinations, self-injury and suicidal ideas. The patient is nervous/anxious.     Blood pressure 129/67, pulse (!) 104, temperature (!) 97.5 F (36.4 C), resp. rate 16, height 5' 5.5" (1.664 m), weight 77.6 kg, SpO2 100 %.Body mass index is 28.02 kg/m.  General Appearance: Fairly Groomed  Eye Contact:  Good  Speech:  Normal Rate  Volume:  Normal  Mood:  Euthymic  Affect:  Constricted  Thought Process:  Coherent  Orientation:  Full (Time, Place, and Person)  Thought Content:  Logical  Suicidal Thoughts:  No  Homicidal  Thoughts:  No  Memory:  Immediate;   Fair Recent;   Fair  Judgement:  Fair  Insight:  Fair  Psychomotor Activity:  Decreased  Concentration:  Concentration: Fair and Attention Span: Fair  Recall:  [employer] of Knowledge:  Fair  Language:  Fair  Akathisia:  No  Handed:  Right  AIMS (if indicated):     Assets:  Communication Skills Desire for Improvement Housing Social Support  ADL's:  Intact  Cognition:  WNL  Sleep:  Number of Hours: 6     Treatment Plan Summary: Daily contact with patient to assess and evaluate symptoms and progress in treatment and Medication management   Continue inpatient hospitalization.  Continue Buspar 5 mg PO TID (on gradual taper down) Continue Cymbalta 40 mg PO daily (on gradual taper down) Continue Luvox 50  mg PO QHS for anxiety Continue Fanapt 2 mg PO BID for anxiety Continue Ativan 1 mg PO TID for anxiety Continue Avapro 300 mg PO daily for HTN Continue Tessalon 200 mg PO TID PRN cough Continue Keflex 500 mg PO Q12HR for infection prophylaxis post-injury Continue Lipitor 80 mg PO daily for HLD Continue Protonix 40 mg PO daily for reflux Continue Lantus 28 units SQ daily for diabetes Continue sliding scale insulin for diabetes Continue metformin 1000 mg PO daily for diabetes Continue ASA 81 mg PO daily for antiplatelet  Patient will participate in the therapeutic group milieu.  Discharge disposition in progress.   Connye Burkitt, NP 09/21/2019, 8:38 AM

## 2019-09-21 NOTE — Progress Notes (Signed)
D:  Patient denied SI and HI, contracts for safety.  Denied A/V hallucinations.  Denied pain. A:  Medications administered per MD orders.  Emotional support and encouragement given patient. R:  Safety maintained with 15 minute checks.

## 2019-09-21 NOTE — Progress Notes (Signed)
D.  Pt in bed on approach, no complaints voiced at this time.  Pt did not get up for evening wrap up group, has remained in bed.  Pt denies SI/HI/AVH at this time.  A.  Support and encouragement offered, medications given as ordered  R.  Pt remains safe on the unit, will continue to monitor.

## 2019-09-22 LAB — GLUCOSE, CAPILLARY
Glucose-Capillary: 199 mg/dL — ABNORMAL HIGH (ref 70–99)
Glucose-Capillary: 138 mg/dL — ABNORMAL HIGH (ref 70–99)
Glucose-Capillary: 118 mg/dL — ABNORMAL HIGH (ref 70–99)
Glucose-Capillary: 169 mg/dL — ABNORMAL HIGH (ref 70–99)

## 2019-09-22 NOTE — [facility] Group Notes (Signed)
[facility] Group Notes: (Clinical Social Work)   09/22/2019      Type of Therapy:  Group Therapy   Participation Level:  Did Not Attend - was invited both individually by [facility] and by overhead announcement, chose not to attend.   Selmer Dominion, LCSW 09/22/2019, 3:05 PM

## 2019-09-22 NOTE — Progress Notes (Signed)
[place] NOVEL CORONAVIRUS (COVID-19) DAILY CHECK-OFF SYMPTOMS - answer yes or no to each - every day NO YES  Have you had a fever in the past 24 hours?  . Fever (Temp > 37.80C / 100F) X   Have you had any of these symptoms in the past 24 hours? . New Cough .  Sore Throat  .  Shortness of Breath .  Difficulty Breathing .  Unexplained Body Aches   X   Have you had any one of these symptoms in the past 24 hours not related to allergies?   . Runny Nose .  Nasal Congestion .  Sneezing   X   If you have had runny nose, nasal congestion, sneezing in the past 24 hours, has it worsened?  X   EXPOSURES - check yes or no X   Have you traveled outside the state in the past 14 days?  X   Have you been in contact with someone with a confirmed diagnosis of COVID-19 or PUI in the past 14 days without wearing appropriate PPE?  X   Have you been living in the same home as a person with confirmed diagnosis of COVID-19 or a PUI (household contact)?    X   Have you been diagnosed with COVID-19?    X              What to do next: Answered NO to all: Answered YES to anything:   Proceed with unit schedule Follow the [facility] Inpatient Flowsheet.   

## 2019-09-22 NOTE — Progress Notes (Signed)
   09/22/19 1600  Psych Admission Type (Psych Patients Only)  Admission Status Voluntary  Psychosocial Assessment  Patient Complaints None  Eye Contact Brief  Facial Expression Flat  Affect Appropriate to circumstance  Speech Logical/coherent  Interaction Minimal  Motor Activity Slow  Appearance/Hygiene Disheveled  Behavior Characteristics Cooperative  Mood Depressed;Anxious  Thought Process  Coherency WDL  Content Obsessions  Delusions None reported or observed  Perception WDL  Hallucination None reported or observed  Judgment Poor  Confusion None  Danger to Self  Current suicidal ideation? Denies  Danger to Others  Danger to Others None reported or observed

## 2019-09-22 NOTE — Progress Notes (Signed)
[facility] MD Progress Note  09/22/2019 2:20 PM Jeanette Rose  MRN:  [id number] Subjective:   Patient acknowledges some improvement insofar as anxiety, but expresses concern that current medication regimen may cause her affect to become flattened and " like  I won't be able to feel anything anymore".  Denies suicidal ideations .    Objective:  I have reviewed chart notes and  and have met with patient. 63 year old female, history of depression, anxiety, prior diagnosis of PTSD, GAD, MDD. Presented to hospital on 12/14 reporting worsening symptoms of depression, anxiety and suicidal ideations.  Patient presents with partial improvement- she presents less anxious than earlier this week (although still significantly so.). At this time does not present tearful and affect less labile overall . Today she is more focused on medication side effects, and expresses concern that medications may cause affective flattening/restriction of range of affect. Currently denies suicidal ideations. Limited milieu participation, visible in hallway at times, little milieu or group participation at present. CBG today 199 and 138  Principal Problem: MDD (major depressive disorder), recurrent severe, without psychosis ([place]) Diagnosis: Principal Problem:   MDD (major depressive disorder), recurrent severe, without psychosis ([place]) Active Problems:   Type 2 diabetes mellitus with diabetic polyneuropathy, with long-term current use of insulin (HCC)   Generalized anxiety disorder   Benzodiazepine dependence, continuous ([place])   Persistent depressive disorder with anxious distress, currently severe  Total Time spent with patient: 15 minutes  Past Psychiatric History: See admission H&P  Past Medical History:  Past Medical History:  Diagnosis Date  . Acute acalculous cholecystitis s/p lap chole 04/03/2014 04/02/2014  . Allergy   . Anemia   . Anxiety   . Asthma   . Depression   . Fatty liver    noted on CT per [facility]  records  . Fibromyalgia   . GERD (gastroesophageal reflux disease)   . Hyperlipidemia associated with type 2 diabetes mellitus ([place])   . Hypertension   . Hypothyroid 2015   Transiently in 2015. Synthroid 4mg was stopped 05/07/2016 w/ tsh nml following.  . Obesity (BMI 30-39.9)   . OSA (obstructive sleep apnea)    non-compliant with CPAP  . Seborrheic dermatitis    on face and scalp, [facility]  . Stroke ([facility]    TIA  . Type 2 diabetes mellitus ([place]     Past Surgical History:  Procedure Laterality Date  . BREAST REDUCTION SURGERY  06/2008  . CHOLECYSTECTOMY N/A 04/03/2014   Procedure: LAPAROSCOPIC CHOLECYSTECTOMY ;  Surgeon: SAdin Hector MD;  Location: [facility] ORS;  Service: General;  Laterality: N/A;  . REDUCTION MAMMAPLASTY    . TUBAL LIGATION     Family History:  Family History  Problem Relation Age of Onset  . Atrial fibrillation Mother   . Heart disease Father        CAD  . Diabetes Brother   . Cancer Maternal Grandmother   . Diabetes Paternal Grandmother   . Fibromyalgia Sister   . Colitis Sister    Family Psychiatric  History: See admission H&P Social History:  Social History   Substance and Sexual Activity  Alcohol Use No     Social History   Substance and Sexual Activity  Drug Use No    Social History   Socioeconomic History  . Marital status: Divorced    Spouse name: Not on file  . Number of children: Not on file  . Years of education: Not on file  .  Highest education level: Not on file  Occupational History  . Not on file  Tobacco Use  . Smoking status: Former Smoker    Packs/day: 4.00    Years: 15.00    Pack years: 60.00    Types: Cigarettes    Quit date: 04/22/2004    Years since quitting: 15.4  . Smokeless tobacco: Never Used  . Tobacco comment: 4 pack year hx  Substance and Sexual Activity  . Alcohol use: No  . Drug use: No  . Sexual activity: Never  Other Topics Concern  . Not on file  Social History Narrative   . Not on file   Social Determinants of Health   Financial Resource Strain:   . Difficulty of Paying Living Expenses: Not on file  Food Insecurity:   . Worried About Charity fundraiser in the Last Year: Not on file  . Ran Out of Food in the Last Year: Not on file  Transportation Needs:   . Lack of Transportation (Medical): Not on file  . Lack of Transportation (Non-Medical): Not on file  Physical Activity:   . Days of Exercise per Week: Not on file  . Minutes of Exercise per Session: Not on file  Stress:   . Feeling of Stress : Not on file  Social Connections:   . Frequency of Communication with Friends and Family: Not on file  . Frequency of Social Gatherings with Friends and Family: Not on file  . Attends Religious Services: Not on file  . Active Member of Clubs or Organizations: Not on file  . Attends Archivist Meetings: Not on file  . Marital Status: Not on file   Additional Social History:   Sleep:  improving   Appetite:  Fair  Current Medications: Current Facility-Administered Medications  Medication Dose Route Frequency Provider Last Rate Last Admin  . aspirin EC tablet 81 mg  81 mg Oral Daily Rankin, Shuvon B, NP   81 mg at 09/22/19 0811  . atorvastatin (LIPITOR) tablet 80 mg  80 mg Oral QHS Rankin, Shuvon B, NP   80 mg at 09/21/19 2209  . benzonatate (TESSALON) capsule 200 mg  200 mg Oral TID PRN Sharma Covert, MD   200 mg at 09/19/19 2143  . cephALEXin (KEFLEX) capsule 500 mg  500 mg Oral Q12H Sharma Covert, MD   500 mg at 09/22/19 2094  . docusate sodium (COLACE) capsule 100 mg  100 mg Oral BID Rankin, Shuvon B, NP   100 mg at 09/22/19 0811  . DULoxetine (CYMBALTA) DR capsule 40 mg  40 mg Oral Daily Harmonie Verrastro, Myer Peer, MD   40 mg at 09/22/19 0811  . fluvoxaMINE (LUVOX) tablet 50 mg  50 mg Oral QHS Sharma Covert, MD   50 mg at 09/21/19 2209  . iloperidone (FANAPT) tablet 2 mg  2 mg Oral BID Icie Kuznicki, Myer Peer, MD   2 mg at 09/22/19 0811  .  insulin aspart (novoLOG) injection 0-9 Units  0-9 Units Subcutaneous TID WC Rankin, Shuvon B, NP   1 Units at 09/22/19 1158  . insulin glargine (LANTUS) injection 28 Units  28 Units Subcutaneous q morning - 10a Sharma Covert, MD   28 Units at 09/22/19 0950  . irbesartan (AVAPRO) tablet 300 mg  300 mg Oral Daily Sharma Covert, MD   300 mg at 09/22/19 7096  . LORazepam (ATIVAN) tablet 1 mg  1 mg Oral TID Sharma Covert, MD   1  mg at 09/22/19 1202  . metFORMIN (GLUCOPHAGE-XR) 24 hr tablet 1,000 mg  1,000 mg Oral Q breakfast Rankin, Shuvon B, NP   1,000 mg at 09/22/19 0810  . miconazole (MICOTIN) 2 % cream   Topical BID Rankin, Shuvon B, NP   Given at 09/18/19 0759  . pantoprazole (PROTONIX) EC tablet 40 mg  40 mg Oral BID Sharma Covert, MD   40 mg at 09/22/19 1771    Lab Results:  Results for orders placed or performed during the hospital encounter of 09/11/19 (from the past 48 hour(s))  Glucose, capillary     Status: Abnormal   Collection Time: 09/20/19  5:01 PM  Result Value Ref Range   Glucose-Capillary 106 (H) 70 - 99 mg/dL   Comment 1 Notify RN    Comment 2 Document in Chart   Glucose, capillary     Status: Abnormal   Collection Time: 09/20/19  8:14 PM  Result Value Ref Range   Glucose-Capillary 200 (H) 70 - 99 mg/dL   Comment 1 Notify RN   Glucose, capillary     Status: Abnormal   Collection Time: 09/21/19  5:54 AM  Result Value Ref Range   Glucose-Capillary 153 (H) 70 - 99 mg/dL   Comment 1 Notify RN   Basic metabolic panel     Status: Abnormal   Collection Time: 09/21/19  6:31 AM  Result Value Ref Range   Sodium 138 135 - 145 mmol/L   Potassium 3.9 3.5 - 5.1 mmol/L   Chloride 104 98 - 111 mmol/L   CO2 24 22 - 32 mmol/L   Glucose, Bld 185 (H) 70 - 99 mg/dL   BUN 27 (H) 8 - 23 mg/dL   Creatinine, Ser 0.82 0.44 - 1.00 mg/dL   Calcium 9.3 8.9 - 10.3 mg/dL   GFR calc non Af Amer >60 >60 mL/min   GFR calc Af Amer >60 >60 mL/min   Anion gap 10 5 - 15     Comment: Performed at [facility], [place] [address], [place], [place] [address]  Glucose, capillary     Status: Abnormal   Collection Time: 09/21/19 12:02 PM  Result Value Ref Range   Glucose-Capillary 201 (H) 70 - 99 mg/dL   Comment 1 Notify RN    Comment 2 Document in Chart   Glucose, capillary     Status: Abnormal   Collection Time: 09/21/19  4:49 PM  Result Value Ref Range   Glucose-Capillary 142 (H) 70 - 99 mg/dL  Glucose, capillary     Status: Abnormal   Collection Time: 09/21/19 10:36 PM  Result Value Ref Range   Glucose-Capillary 157 (H) 70 - 99 mg/dL   Comment 1 Notify RN    Comment 2 Document in Chart   Glucose, capillary     Status: Abnormal   Collection Time: 09/22/19  6:46 AM  Result Value Ref Range   Glucose-Capillary 199 (H) 70 - 99 mg/dL   Comment 1 Notify RN    Comment 2 Document in Chart   Glucose, capillary     Status: Abnormal   Collection Time: 09/22/19 11:52 AM  Result Value Ref Range   Glucose-Capillary 138 (H) 70 - 99 mg/dL    Blood Alcohol level:  Lab Results  Component Value Date   ETH <10 09/10/2019   ETH <10 03/83/3383    Metabolic Disorder Labs: Lab Results  Component Value Date   HGBA1C 9.9 (H) 09/12/2019   MPG 237.43 09/12/2019  MPG 134.11 03/09/2019   No results found for: PROLACTIN Lab Results  Component Value Date   CHOL 174 10/09/2018   TRIG 210 (H) 10/09/2018   HDL 44 10/09/2018   CHOLHDL 4.0 10/09/2018   LDLCALC 88 10/09/2018   LDLCALC 77 06/28/2018    Physical Findings: AIMS: Facial and Oral Movements Muscles of Facial Expression: None, normal Lips and Perioral Area: None, normal Jaw: None, normal Tongue: None, normal,Extremity Movements Upper (arms, wrists, hands, fingers): None, normal Lower (legs, knees, ankles, toes): None, normal, Trunk Movements Neck, shoulders, hips: None, normal, Overall Severity Severity of abnormal movements (highest score from questions above): None,  normal Incapacitation due to abnormal movements: None, normal Patient's awareness of abnormal movements (rate only patient's report): No Awareness, Dental Status Current problems with teeth and/or dentures?: No Does patient usually wear dentures?: No  CIWA:  CIWA-Ar Total: 1 COWS:  COWS Total Score: 2  Musculoskeletal: Strength & Muscle Tone: within normal limits- no distal tremors noted today Gait & Station: normal Patient leans: N/A  Psychiatric Specialty Exam: Physical Exam  Nursing note and vitals reviewed. Constitutional: She is oriented to person, place, and time. She appears well-developed and well-nourished.  HENT:  Head: Normocephalic and atraumatic.  Respiratory: Effort normal.  Neurological: She is alert and oriented to person, place, and time.    Review of Systems no shortness of breath, no cough.   Blood pressure 129/70, pulse (!) 114, temperature 97.8 F (36.6 C), resp. rate 18, height 5' 5.5" (1.664 m), weight 77.6 kg, SpO2 100 %.Body mass index is 28.02 kg/m.  General Appearance: Fairly Groomed  Eye Contact:  Good  Speech:  Normal Rate  Volume:  Normal  Mood:  mood partially improved   Affect:  less labile, still anxious, not tearful today  Thought Process:  Linear, NA and Descriptions of Associations: Intact  Orientation:  Other:  fully alert and attentive  Thought Content:  no hallucinations, no delusions, not internally preoccupied .   Suicidal Thoughts:  No  currently denies suicidal plan or intention and contracts for safety on unit   Homicidal Thoughts:  No  Memory:  recent and remote grossly intact   Judgement:  Fair  Insight:  Fair  Psychomotor Activity: no restlessness or agitation noted at this time  Concentration:  Concentration: fair- improving  and Attention Span: fair-improving  Recall:  [employer] of Knowledge:  Fair  Language:  Good  Akathisia:  Negative  Handed:  Right  AIMS (if indicated):     Assets:  Desire for  Improvement Resilience  ADL's:  Impaired  Cognition:  WNL  Sleep:  Number of Hours: 6   Assessment :  63 year old female, history of depression, anxiety, prior diagnosis of PTSD, GAD, MDD. Presented to hospital on 12/14 reporting worsening symptoms of depression, anxiety and suicidal ideations.  Patient remains ruminative, anxious , but today presents with some improvement and affect presents less anxious , less constricted and no longer tearful.  RN staff also reports there has been some decrease in the severity of her anxiety. Thus far tolerating Fanapt trial well  Insofar as medications , the current plan is to taper off Cymbalta ,as reported it was no longer working even at max doses, continue Ativan at 1 mgr TID, continue to titrate Luvox up as tolerated, and have added Fanapt for management of severe anxiety/ruminations ( patient aware this is off label use ) .   Treatment Plan Summary: Treatment Plan reviewed as below today  12/26  Encourage group and milieu participation Continue coated aspirin 81 mg p.o. daily. Continue Lipitor 80 mg p.o. nightly for hyperlipidemia. D/C Buspar  Continue Keflex 500 mg p.o. every 12 hours as needed for possible infection from impalement of pencil. Continue Cymbalta  40  mg p.o. daily - for anxiety and depression. Wean off gradually as tolerated  Continue sliding scale insulin. Continue  Lantus insulin  28 units subcu q. a.m. for diabetes mellitus. Continue  Avapro  300 mg p.o. daily for hypertension. Continue Lorazepam 1 mg p.o. 3 times daily for anxiety. Continue Metformin XR 1000 mg p.o. daily for diabetes mellitus. Continue Fanapt 2 mgrs BID for mood/anxiety- side effects reviewed with patient . Continue Protonix 40 mg p.o. daily for reflux. Continue Luvox 50 mg p.o. nightly for anxiety and depression. Disposition planning-in progress. Would consider [place] if possible .    Jenne Campus, MD 09/22/2019, 2:20 PM    Patient ID: Ventura Leggitt, female   DOB: [birth year], 63 y.o.   MRN: [id number]

## 2019-09-23 LAB — GLUCOSE, CAPILLARY
Glucose-Capillary: 139 mg/dL — ABNORMAL HIGH (ref 70–99)
Glucose-Capillary: 160 mg/dL — ABNORMAL HIGH (ref 70–99)
Glucose-Capillary: 134 mg/dL — ABNORMAL HIGH (ref 70–99)
Glucose-Capillary: 191 mg/dL — ABNORMAL HIGH (ref 70–99)

## 2019-09-23 MED ORDER — FLUVOXAMINE MALEATE 50 MG PO TABS
75.0000 mg | ORAL_TABLET | Freq: Every day | ORAL | Status: DC
  Administered 2019-09-23 – 2019-09-24 (×2): 75 mg via ORAL
  Filled 2019-09-23 (×4): qty 2

## 2019-09-23 MED ORDER — DULOXETINE HCL 30 MG PO CPEP
30.0000 mg | ORAL_CAPSULE | Freq: Every day | ORAL | Status: DC
  Administered 2019-09-24: 30 mg via ORAL
  Filled 2019-09-23 (×2): qty 1

## 2019-09-23 NOTE — Progress Notes (Signed)
Patient ID: Jeanette Rose, female   DOB: [birth year], 63 y.o.   MRN: [id number] D: Pt alert and oriented, affect blunted and mood depressed, pt  is preoccupied with not being able to do certain activities of daily living such as cleaning herself after doing the bathroom, and kept repeating: "I can't, I can't". Pt given positive reinforcements, and provided with active listening.  Pt denies SI/HI/AVH.  A: All meds given as ordered, and Q15 minute checks in place for safety.  R: Will continue to monitor on Q15 minute checks.  Pt is currently in bed, sleeping and snoring loudly and does not seem to be in any apparent distress.

## 2019-09-23 NOTE — Progress Notes (Addendum)
[facility] MD Progress Note  09/23/2019 4:38 PM Jeanette Rose  MRN:  [id number] Subjective:    Patient reports "I feel the same".  Continues to describe anxiety and fear associated with living alone/activities of daily living.  Does not endorse medication side effects at this time. Denies suicidal ideations at present.    Objective:  I have reviewed chart notes and  and have met with patient. 63 year old female, history of depression, anxiety, prior diagnosis of PTSD, GAD, MDD. Presented to hospital on 12/14 reporting worsening symptoms of depression, anxiety and suicidal ideations.  Although patient remains ruminative and anxious she presents with partial but noticeable improvement.  Her affect appears less severely anxious, less labile, and she is no longer tearful.  She is out on unit more, and initiating activities more than before such as calling her sister on the phone today, and ambulating/walking more/spending less time in bed. Currently denies suicidal ideations. Group/milieu participation remains limited although some tentative interaction with peers has been noted by staff. At patient request have spoken with sister who agrees that patient is improving and who thinks that recent medication adjustments have been helpful. Currently does not endorse significant medication side effects although remains ruminative and concerned about possible side effects.  She remains anxious, particularly with regards to disposition planning.  States "I do not know that I can do it" "I am afraid" when discussing discharge planning.  Patient's sister has reported that plan is for patient to move into a newer trailer/home following discharge. CBGs today 139, 160    Principal Problem: MDD (major depressive disorder), recurrent severe, without psychosis ([place]) Diagnosis: Principal Problem:   MDD (major depressive disorder), recurrent severe, without psychosis ([place]) Active Problems:   Type 2 diabetes mellitus  with diabetic polyneuropathy, with long-term current use of insulin (HCC)   Generalized anxiety disorder   Benzodiazepine dependence, continuous ([place])   Persistent depressive disorder with anxious distress, currently severe  Total Time spent with patient: 20 minutes  Past Psychiatric History: See admission H&P  Past Medical History:  Past Medical History:  Diagnosis Date  . Acute acalculous cholecystitis s/p lap chole 04/03/2014 04/02/2014  . Allergy   . Anemia   . Anxiety   . Asthma   . Depression   . Fatty liver    noted on CT per [facility] records  . Fibromyalgia   . GERD (gastroesophageal reflux disease)   . Hyperlipidemia associated with type 2 diabetes mellitus ([place])   . Hypertension   . Hypothyroid 2015   Transiently in 2015. Synthroid 80mg was stopped 05/07/2016 w/ tsh nml following.  . Obesity (BMI 30-39.9)   . OSA (obstructive sleep apnea)    non-compliant with CPAP  . Seborrheic dermatitis    [facility] face [facility]  . St[facility] ([facility]    TIA  . [place] mellitus [place]     Past Surgical History:  Procedure Laterality Date  . BREAST REDUCTION SURGERY  06/2008  . CHOLECYSTECTOMY N/A 04/03/2014   Procedure: LAPAROSCOPIC CHOLECYSTECTOMY ;  Surgeon: SAdin Hector MD;  Location: [facility] ORS;  Service: General;  Laterality: N/A;  . REDUCTION MAMMAPLASTY    . TUBAL LIGATION     Family History:  Family History  Problem Relation Age of Onset  . Atrial fibrillation Mother   . Heart disease Father        CAD  . Diabetes Brother   . Cancer Maternal Grandmother   . Diabetes Paternal Grandmother   .  Fibromyalgia Sister   . Colitis Sister    Family Psychiatric  History: See admission H&P Social History:  Social History   Substance and Sexual Activity  Alcohol Use No     Social History   Substance and Sexual Activity  Drug Use No    Social History   Socioeconomic History  . Marital status: Divorced    Spouse name: Not on file  .  Number of children: Not on file  . Years of education: Not on file  . Highest education level: Not on file  Occupational History  . Not on file  Tobacco Use  . Smoking status: Former Smoker    Packs/day: 4.00    Years: 15.00    Pack years: 60.00    Types: Cigarettes    Quit date: 04/22/2004    Years since quitting: 15.4  . Smokeless tobacco: Never Used  . Tobacco comment: 4 pack year hx  Substance and Sexual Activity  . Alcohol use: No  . Drug use: No  . Sexual activity: Never  Other Topics Concern  . Not on file  Social History Narrative  . Not on file   Social Determinants of Health   Financial Resource Strain:   . Difficulty of Paying Living Expenses: Not on file  Food Insecurity:   . Worried About Charity fundraiser in the Last Year: Not on file  . Ran Out of Food in the Last Year: Not on file  Transportation Needs:   . Lack of Transportation (Medical): Not on file  . Lack of Transportation (Non-Medical): Not on file  Physical Activity:   . Days of Exercise per Week: Not on file  . Minutes of Exercise per Session: Not on file  Stress:   . Feeling of Stress : Not on file  Social Connections:   . Frequency of Communication with Friends and Family: Not on file  . Frequency of Social Gatherings with Friends and Family: Not on file  . Attends Religious Services: Not on file  . Active Member of Clubs or Organizations: Not on file  . Attends Archivist Meetings: Not on file  . Marital Status: Not on file   Additional Social History:   Sleep:  improving   Appetite:  Fair  Current Medications: Current Facility-Administered Medications  Medication Dose Route Frequency Provider Last Rate Last Admin  . aspirin EC tablet 81 mg  81 mg Oral Daily Rankin, Shuvon B, NP   81 mg at 09/23/19 0810  . atorvastatin (LIPITOR) tablet 80 mg  80 mg Oral QHS Rankin, Shuvon B, NP   80 mg at 09/22/19 2222  . benzonatate (TESSALON) capsule 200 mg  200 mg Oral TID PRN Sharma Covert, MD   200 mg at 09/22/19 2222  . docusate sodium (COLACE) capsule 100 mg  100 mg Oral BID Rankin, Shuvon B, NP   100 mg at 09/23/19 0810  . DULoxetine (CYMBALTA) DR capsule 40 mg  40 mg Oral Daily Tabb Croghan, Myer Peer, MD   40 mg at 09/23/19 0810  . fluvoxaMINE (LUVOX) tablet 50 mg  50 mg Oral QHS Sharma Covert, MD   50 mg at 09/22/19 2222  . iloperidone (FANAPT) tablet 2 mg  2 mg Oral BID [place], Myer Peer, MD   2 mg at 09/23/19 0810  . insulin aspart (novoLOG) injection 0-9 Units  0-9 Units Subcutaneous TID WC Rankin, Shuvon B, NP   2 Units at 09/23/19 1151  . insulin glargine (  LANTUS) injection 28 Units  28 Units Subcutaneous q morning - 10a Sharma Covert, MD   28 Units at 09/23/19 0935  . irbesartan (AVAPRO) tablet 300 mg  300 mg Oral Daily Sharma Covert, MD   300 mg at 09/23/19 0810  . LORazepam (ATIVAN) tablet 1 mg  1 mg Oral TID Sharma Covert, MD   1 mg at 09/23/19 1153  . metFORMIN (GLUCOPHAGE-XR) 24 hr tablet 1,000 mg  1,000 mg Oral Q breakfast Rankin, Shuvon B, NP   1,000 mg at 09/23/19 0809  . miconazole (MICOTIN) 2 % cream   Topical BID Rankin, Shuvon B, NP   Given at 09/18/19 0759  . pantoprazole (PROTONIX) EC tablet 40 mg  40 mg Oral BID Sharma Covert, MD   40 mg at 09/23/19 0093    Lab Results:  Results for orders placed or performed during the hospital encounter of 09/11/19 (from the past 48 hour(s))  Glucose, capillary     Status: Abnormal   Collection Time: 09/21/19  4:49 PM  Result Value Ref Range   Glucose-Capillary 142 (H) 70 - 99 mg/dL  Glucose, capillary     Status: Abnormal   Collection Time: 09/21/19 10:36 PM  Result Value Ref Range   Glucose-Capillary 157 (H) 70 - 99 mg/dL   Comment 1 Notify RN    Comment 2 Document in Chart   Glucose, capillary     Status: Abnormal   Collection Time: 09/22/19  6:46 AM  Result Value Ref Range   Glucose-Capillary 199 (H) 70 - 99 mg/dL   Comment 1 Notify RN    Comment 2 Document in Chart    Glucose, capillary     Status: Abnormal   Collection Time: 09/22/19 11:52 AM  Result Value Ref Range   Glucose-Capillary 138 (H) 70 - 99 mg/dL  Glucose, capillary     Status: Abnormal   Collection Time: 09/22/19  4:45 PM  Result Value Ref Range   Glucose-Capillary 118 (H) 70 - 99 mg/dL   Comment 1 Notify RN    Comment 2 Document in Chart   Glucose, capillary     Status: Abnormal   Collection Time: 09/22/19  8:49 PM  Result Value Ref Range   Glucose-Capillary 169 (H) 70 - 99 mg/dL  Glucose, capillary     Status: Abnormal   Collection Time: 09/23/19  5:45 AM  Result Value Ref Range   Glucose-Capillary 139 (H) 70 - 99 mg/dL   Comment 1 Notify RN    Comment 2 Document in Chart   Glucose, capillary     Status: Abnormal   Collection Time: 09/23/19 11:49 AM  Result Value Ref Range   Glucose-Capillary 160 (H) 70 - 99 mg/dL    Blood Alcohol level:  Lab Results  Component Value Date   ETH <10 09/10/2019   ETH <10 81/82/9937    Metabolic Disorder Labs: Lab Results  Component Value Date   HGBA1C 9.9 (H) 09/12/2019   MPG 237.43 09/12/2019   MPG 134.11 03/09/2019   No results found for: PROLACTIN Lab Results  Component Value Date   CHOL 174 10/09/2018   TRIG 210 (H) 10/09/2018   HDL 44 10/09/2018   CHOLHDL 4.0 10/09/2018   LDLCALC 88 10/09/2018   LDLCALC 77 06/28/2018    Physical Findings: AIMS: Facial and Oral Movements Muscles of Facial Expression: None, normal Lips and Perioral Area: None, normal Jaw: None, normal Tongue: None, normal,Extremity Movements Upper (arms, wrists, hands, fingers): None, normal  Lower (legs, knees, ankles, toes): None, normal, Trunk Movements Neck, shoulders, hips: None, normal, Overall Severity Severity of abnormal movements (highest score from questions above): None, normal Incapacitation due to abnormal movements: None, normal Patient's awareness of abnormal movements (rate only patient's report): No Awareness, Dental Status Current  problems with teeth and/or dentures?: No Does patient usually wear dentures?: No  CIWA:  CIWA-Ar Total: 1 COWS:  COWS Total Score: 2  Musculoskeletal: Strength & Muscle Tone: within normal limits- no distal tremors noted today Gait & Station: normal Patient leans: N/A  Psychiatric Specialty Exam: Physical Exam  Nursing note and vitals reviewed. Constitutional: She is oriented to person, place, and time. She appears well-developed and well-nourished.  HENT:  Head: Normocephalic and atraumatic.  Respiratory: Effort normal.  Neurological: She is alert and oriented to person, place, and time.    Review of Systems no headache, no chest pain, no shortness of breath, no cough.  No vomiting   Blood pressure 110/73, pulse (!) 109, temperature 97.8 F (36.6 C), resp. rate 18, height 5' 5.5" (1.664 m), weight 77.6 kg, SpO2 100 %.Body mass index is 28.02 kg/m.  General Appearance: Improving grooming  Eye Contact:  Good  Speech:  Normal Rate  Volume:  Normal  Mood:  Gradual/partial improvement  Affect:  Still significantly anxious but with partial improvement compared to prior presentation, less labile and not tearful  Thought Process:  Linear, NA and Descriptions of Associations: Intact  Orientation:  Other:  fully alert and attentive  Thought Content:  no hallucinations, no delusions, not internally preoccupied . Remains ruminative  Suicidal Thoughts:  No  currently denies suicidal plan or intention and contracts for safety on unit   Homicidal Thoughts:  No  Memory:  recent and remote grossly intact   Judgement:  Fair  Insight:  Fair  Psychomotor Activity: no restlessness or agitation noted at this time-spending more time on unit/out of bed  Concentration:  Concentration: Improving and Attention Span: Improving  Recall:  [employer] of Knowledge:  Fair  Language:  Good  Akathisia:  Negative  Handed:  Right  AIMS (if indicated):     Assets:  Desire for Improvement Resilience   ADL's:  Impaired  Cognition:  WNL  Sleep:  Number of Hours: 6   Assessment :  63 year old female, history of depression, anxiety, prior diagnosis of PTSD, GAD, MDD. Presented to hospital on 12/14 reporting worsening symptoms of depression, anxiety and suicidal ideations.  Some improvement noted, particularly insofar as partial decrease in severe anxiety and ruminations, gradually improving milieu visibility and participation.  RN staff and sister also report there has been partial improvement.  Patient continues to ruminate/express anxiety about her ability to function independently, describing a subjective sense of fear that she will not be able to function.  This seems to have been triggered in part by currently preparing for a move from her home to another.  Tolerating medications, including Fanapt which is new trial and Cymbalta taper, well thus far   Treatment Plan Summary: Treatment Plan reviewed as below today 12/27 Encourage group and milieu participation Continue coated aspirin 81 mg p.o. daily. Continue Lipitor 80 mg p.o. nightly for hyperlipidemia. Continue Keflex 500 mg p.o. every 12 hours as needed for possible infection from impalement of pencil. Decrease Cymbalta to 30 mg p.o. daily - for anxiety and depression. Wean off gradually as tolerated  Continue sliding scale insulin. Continue  Lantus insulin  28 units subcu q. a.m. for diabetes  mellitus. Continue  Avapro  300 mg p.o. daily for hypertension. Continue Lorazepam 1 mg p.o. 3 times daily for anxiety. Continue Metformin XR 1000 mg p.o. daily for diabetes mellitus. Continue Fanapt 2 mgrs BID for mood/anxiety- side effects reviewed with patient . Continue Protonix 40 mg p.o. daily for reflux. Increase Luvox to 75 mg p.o. nightly for anxiety and depression. Disposition planning-in progress. Would consider [place] if possible .    Jenne Campus, MD 09/23/2019, 4:38 PM   Patient ID: Jeanette Rose,  female   DOB: September 02, 1956, 63 y.o.   MRN: [id number] Patient ID: Jeanette Rose, female   DOB: 1956-09-14, 63 y.o.   MRN: [id number]

## 2019-09-23 NOTE — Progress Notes (Signed)
[facility] [place] NOVEL CORONAVIRUS (COVID-19) DAILY CHECK-OFF SYMPTOMS - answer yes or no to each - every day NO YES  Have you had a fever in the past 24 hours?  . Fever (Temp > 37.80C / 100F) X   Have you had any of these symptoms in the past 24 hours? . New Cough .  Sore Throat  .  Shortness of Breath .  Difficulty Breathing .  Unexplained Body Aches   X   Have you had any one of these symptoms in the past 24 hours not related to allergies?   . Runny Nose .  Nasal Congestion .  Sneezing   X   If you have had runny nose, nasal congestion, sneezing in the past 24 hours, has it worsened?  X   EXPOSURES - check yes or no X   Have you traveled outside the state in the past 14 days?  X   Have you been in contact with someone with a confirmed diagnosis of COVID-19 or PUI in the past 14 days without wearing appropriate PPE?  X   Have you been living in the same home as a person with confirmed diagnosis of COVID-19 or a PUI (household contact)?    X   Have you been diagnosed with COVID-19?    X              What to do next: Answered NO to all: Answered YES to anything:   Proceed with unit schedule Follow the [facility] Inpatient Flowsheet.   

## 2019-09-23 NOTE — [facility] Group Notes (Signed)
[facility] LCSW Group Therapy Note  09/23/2019  10:00-11:00AM  Type of Therapy and Topic:  Group Therapy:  A [name] Worthy of Support  Participation Level:  Did Not Attend   Description of Group:  Patients in this group were introduced to the concept that additional supports including self-support are an essential part of recovery.  Matching needs with supports to help fulfill those needs was explained.  A song "I Got To Live" was played for the group and was followed by a discussion of what it meant to participants.   The consensus was that the message was to give themselves permission to see happiness in life.  A song entitled "My Own [name]" was played and a group discussion ensued in which patients stated it inspired them to help themselves in order to succeed, because other people cannot achieve their goals such as sobriety or stability for them.  A song was played called "I Am Enough" which led to a discussion about being willing to believe we are worth the effort of being a self-support.   Therapeutic Goals: 1)  demonstrate the importance of being a key part of one's own support system 2)  discuss various available supports 3)  encourage patient to use music as part of their self-support and focus on goals 4)  elicit ideas from patients about supports that need to be added   Summary of Patient Progress:  The patient did not attend group  Therapeutic Modalities:   Motivational Interviewing Activity  Maretta Los

## 2019-09-23 NOTE — Progress Notes (Signed)
D. Pt's mood appears to be improved today- pt observed walking up and down the hall after being  encouraged to ambulate.Pt currently denies SI/HI and AVH  A. Labs and vitals monitored. Pt compliant with medications. Pt supported emotionally and encouraged to express concerns and ask questions.   R. Pt remains safe with 15 minute checks. Will continue POC.

## 2019-09-23 NOTE — Progress Notes (Signed)
Patient ID: Jeanette Rose, female   DOB: 1956/09/01, 63 y.o.   MRN: [id number] D: Pt observed interacting with peers at start of shift in the day room, denies SI/HI/AVH, stated to RN that she might need to have RN "wipe me down there after I sit on the toilet". RN educated patient that assistance will be given if needed, and also educated on the need to be wipe self independently so as to foster also being independent at home after discharge.  Pt states that she cleans herself at home independently and does not have assistance.    A: All meds given as scheduled, Q15 minute checks in place for safety, right forearm with small laceration, well approximated and scabbed, area cleansed with warm water and soap, antibiotic ointment applied, area covered with bandaid.  R: Will continue Q15 minute checks for safety.

## 2019-09-23 NOTE — [facility] Group Notes (Signed)
[facility] Group Notes:  (Nursing/MHT/Case Management/Adjunct)  Date:  09/23/2019  Time:  1300  Type of Therapy:  Nurse Education Discussed healthy support systems and created collages.   Participation Level:  Did Not Attend   Marissa Calamity 09/23/2019, 5:00 PM

## 2019-09-24 LAB — GLUCOSE, CAPILLARY
Glucose-Capillary: 161 mg/dL — ABNORMAL HIGH (ref 70–99)
Glucose-Capillary: 166 mg/dL — ABNORMAL HIGH (ref 70–99)
Glucose-Capillary: 107 mg/dL — ABNORMAL HIGH (ref 70–99)
Glucose-Capillary: 172 mg/dL — ABNORMAL HIGH (ref 70–99)

## 2019-09-24 MED ORDER — DULOXETINE HCL 20 MG PO CPEP
20.0000 mg | ORAL_CAPSULE | Freq: Every day | ORAL | Status: DC
  Filled 2019-09-24 (×2): qty 1

## 2019-09-24 NOTE — [facility] Group Notes (Signed)
LCSW Group Therapy Notes  Type of Therapy and Topic: Group Therapy: Core Beliefs  Participation Level: Did Not Attend  Description of Group: In this group patients will be encouraged to explore their negative and positive core beliefs about themselves, others, and the world. Each patient will be challenged to identify these beliefs and ways to challenge negative core beliefs. This group will be process-oriented, with patients participating in exploration of their own experiences as well as giving and receiving support and challenge from other group members.  Therapeutic Goals: 1. Patient will identify personal core beliefs, both negative and positive. 2. Patient will identify core beliefs relating to others, both negative and positive. 3. Patient will challenge their negative beliefs about themselves and others. 4. Patient will identify three changes they can make to replace negative core beliefs with positive beliefs.  Summary of Patient Progress  Due to the COVID-19 pandemic, this group has been supplemented with worksheets.  Therapeutic Modalities: Cognitive Behavioral Therapy Solution Focused Therapy Motivational Interviewing

## 2019-09-24 NOTE — Progress Notes (Signed)
Adult Psychoeducational Group Note  Date:  09/24/2019 Time:  9:54 PM  Group Topic/Focus:  Wrap-Up Group:   The focus of this group is to help patients review their daily goal of treatment and discuss progress on daily workbooks.  Participation Level:  Did Not Attend  Participation Quality:  Did Not Attend  Affect:  Did Not Attend  Cognitive:  Did Not Attend  Insight: None  Engagement in Group:  Did Not Attend  Modes of Intervention:  Did Not Attend  Additional Comments:  Pt did not attend evening wrap up group tonight.  Candy Sledge 09/24/2019, 9:54 PM

## 2019-09-24 NOTE — Progress Notes (Signed)
Spiritual care group on grief and loss facilitated by chaplain Jerene Pitch MDiv, [facility]  Group Goal:  Support / Education around grief and loss Members engage in facilitated group support and psycho-social education.  Group Description:  Following introductions and group rules, group members engaged in facilitated group dialog and support around topic of loss, with particular support around experiences of loss in their lives. Group Identified types of loss (relationships / self / things) and identified patterns, circumstances, and changes that precipitate losses. Reflected on thoughts / feelings around loss, normalized grief responses, and recognized variety in grief experience.   Group noted Worden's four tasks of grief in discussion.  Group drew on Adlerian / Rogerian, narrative, MI, Patient Progress:  Jeanette Rose came into group twice during group.  Both times, she left soon afterward without engaging in group discussion.

## 2019-09-24 NOTE — Progress Notes (Addendum)
[facility] MD Progress Note  09/24/2019 11:23 AM Jeanette Rose  MRN:  [id number] Subjective:  "I feel exhausted."  Jeanette Rose found lying in bed. She continues to report feeling "weak and scared." She continues to report anxious and depressed mood. She states that she is afraid of getting out of bed and does not feel like she can walk or take care of herself. Per nursing notes, patient has been requesting nurse's help with wiping herself after using the bathroom but is able to complete this self-care independently. Patient has been seen ambulating without difficulty on the unit this morning and has been tending to personal hygiene. She is going to be moving into a new trailer at discharge. She continues to ruminate on fears of returning home and of being by herself. She does appear less anxious than earlier in hospitalization and is not crying during assessment. She states she cried once yesterday but otherwise has not cried in the last 24 hours. She denies SI. Morning CBG 161.  From admission H&P: Patient is a 63 year old female with a past psychiatric history significant for major depression, generalized anxiety disorder, posttraumatic stress disorder and components of borderline personality disorder who presented to the [facility] emergency department on 09/10/2019 with suicidal ideation. The patient stated that she felt as though "all I think about his darkness in the end". The patient was significantly distraught during the interview. She was crying throughout.  Principal Problem: MDD (major depressive disorder), recurrent severe, without psychosis ([place]) Diagnosis: Principal Problem:   MDD (major depressive disorder), recurrent severe, without psychosis ([place]) Active Problems:   Type 2 diabetes mellitus with diabetic polyneuropathy, with long-term current use of insulin (HCC)   Generalized anxiety disorder   Benzodiazepine dependence, continuous ([place])   Persistent depressive  disorder with anxious distress, currently severe  Total Time spent with patient: 15 minutes  Past Psychiatric History: See admission H&P  Past Medical History:  Past Medical History:  Diagnosis Date  . Acute acalculous cholecystitis s/p lap chole 04/03/2014 04/02/2014  . Allergy   . Anemia   . Anxiety   . Asthma   . Depression   . Fatty liver    noted on CT per [facility] records  . Fibromyalgia   . GERD (gastroesophageal reflux disease)   . Hyperlipidemia associated with type 2 diabetes mellitus ([place])   . Hypertension   . Hypothyroid 2015   Transiently in 2015. Synthroid 30mg was stopped 05/07/2016 w/ tsh nml following.  . Obesity (BMI 30-39.9)   . OSA (obstructive sleep apnea)    non-compliant with CPAP  . Seborrheic dermatitis    [facility]  . St[facility] ([facility]    TIA  . [place] 2 [place]     Past Surgical History:  Procedure Laterality Date  . BREAST REDUCTION SURGERY  06/2008  . CHOLECYSTECTOMY N/A 04/03/2014   Procedure: LAPAROSCOPIC CHOLECYSTECTOMY ;  Surgeon: SAdin Hector MD;  Location: [facility] ORS;  Service: General;  Laterality: N/A;  . REDUCTION MAMMAPLASTY    . TUBAL LIGATION     Family History:  Family History  Problem Relation Age of Onset  . Atrial fibrillation Mother   . Heart disease Father        CAD  . Diabetes Brother   . Cancer Maternal Grandmother   . Diabetes Paternal Grandmother   . Fibromyalgia Sister   . Colitis Sister    Family Psychiatric  History: See admission H&P Social History:  Social History   Substance and Sexual Activity  Alcohol Use No     Social History   Substance and Sexual Activity  Drug Use No    Social History   Socioeconomic History  . Marital status: Divorced    Spouse name: Not on file  . Number of children: Not on file  . Years of education: Not on file  . Highest education level: Not on file  Occupational History  . Not on file  Tobacco Use  . Smoking status:  Former Smoker    Packs/day: 4.00    Years: 15.00    Pack years: 60.00    Types: Cigarettes    Quit date: 04/22/2004    Years since quitting: 15.4  . Smokeless tobacco: Never Used  . Tobacco comment: 4 pack year hx  Substance and Sexual Activity  . Alcohol use: No  . Drug use: No  . Sexual activity: Never  Other Topics Concern  . Not on file  Social History Narrative  . Not on file   Social Determinants of Health   Financial Resource Strain:   . Difficulty of Paying Living Expenses: Not on file  Food Insecurity:   . Worried About Charity fundraiser in the Last Year: Not on file  . Ran Out of Food in the Last Year: Not on file  Transportation Needs:   . Lack of Transportation (Medical): Not on file  . Lack of Transportation (Non-Medical): Not on file  Physical Activity:   . Days of Exercise per Week: Not on file  . Minutes of Exercise per Session: Not on file  Stress:   . Feeling of Stress : Not on file  Social Connections:   . Frequency of Communication with Friends and Family: Not on file  . Frequency of Social Gatherings with Friends and Family: Not on file  . Attends Religious Services: Not on file  . Active Member of Clubs or Organizations: Not on file  . Attends Archivist Meetings: Not on file  . Marital Status: Not on file   Additional Social History:                         Sleep: Good  Appetite:  Good  Current Medications: Current Facility-Administered Medications  Medication Dose Route Frequency Provider Last Rate Last Admin  . aspirin EC tablet 81 mg  81 mg Oral Daily Rankin, Shuvon B, NP   81 mg at 09/24/19 0758  . atorvastatin (LIPITOR) tablet 80 mg  80 mg Oral QHS Rankin, Shuvon B, NP   80 mg at 09/23/19 2146  . benzonatate (TESSALON) capsule 200 mg  200 mg Oral TID PRN Sharma Covert, MD   200 mg at 09/23/19 2146  . docusate sodium (COLACE) capsule 100 mg  100 mg Oral BID Rankin, Shuvon B, NP   100 mg at 09/24/19 0801  .  [START ON 09/25/2019] DULoxetine (CYMBALTA) DR capsule 20 mg  20 mg Oral Daily Connye Burkitt, NP      . fluvoxaMINE (LUVOX) tablet 75 mg  75 mg Oral QHS Kamera Dubas, Myer Peer, MD   75 mg at 09/23/19 2200  . iloperidone (FANAPT) tablet 2 mg  2 mg Oral BID Jasen Hartstein, Myer Peer, MD   2 mg at 09/24/19 0758  . insulin aspart (novoLOG) injection 0-9 Units  0-9 Units Subcutaneous TID WC Rankin, Shuvon B, NP   2 Units at 09/24/19 0708  . insulin glargine (  LANTUS) injection 28 Units  28 Units Subcutaneous q morning - 10a Sharma Covert, MD   28 Units at 09/24/19 1027  . irbesartan (AVAPRO) tablet 300 mg  300 mg Oral Daily Sharma Covert, MD   300 mg at 09/24/19 0757  . LORazepam (ATIVAN) tablet 1 mg  1 mg Oral TID Sharma Covert, MD   1 mg at 09/24/19 0801  . metFORMIN (GLUCOPHAGE-XR) 24 hr tablet 1,000 mg  1,000 mg Oral Q breakfast Rankin, Shuvon B, NP   1,000 mg at 09/24/19 0757  . miconazole (MICOTIN) 2 % cream   Topical BID Rankin, Shuvon B, NP   Given at 09/18/19 0759  . pantoprazole (PROTONIX) EC tablet 40 mg  40 mg Oral BID Sharma Covert, MD   40 mg at 09/24/19 4496    Lab Results:  Results for orders placed or performed during the hospital encounter of 09/11/19 (from the past 48 hour(s))  Glucose, capillary     Status: Abnormal   Collection Time: 09/22/19 11:52 AM  Result Value Ref Range   Glucose-Capillary 138 (H) 70 - 99 mg/dL  Glucose, capillary     Status: Abnormal   Collection Time: 09/22/19  4:45 PM  Result Value Ref Range   Glucose-Capillary 118 (H) 70 - 99 mg/dL   Comment 1 Notify RN    Comment 2 Document in Chart   Glucose, capillary     Status: Abnormal   Collection Time: 09/22/19  8:49 PM  Result Value Ref Range   Glucose-Capillary 169 (H) 70 - 99 mg/dL  Glucose, capillary     Status: Abnormal   Collection Time: 09/23/19  5:45 AM  Result Value Ref Range   Glucose-Capillary 139 (H) 70 - 99 mg/dL   Comment 1 Notify RN    Comment 2 Document in Chart   Glucose,  capillary     Status: Abnormal   Collection Time: 09/23/19 11:49 AM  Result Value Ref Range   Glucose-Capillary 160 (H) 70 - 99 mg/dL  Glucose, capillary     Status: Abnormal   Collection Time: 09/23/19  5:00 PM  Result Value Ref Range   Glucose-Capillary 134 (H) 70 - 99 mg/dL  Glucose, capillary     Status: Abnormal   Collection Time: 09/23/19  7:57 PM  Result Value Ref Range   Glucose-Capillary 191 (H) 70 - 99 mg/dL  Glucose, capillary     Status: Abnormal   Collection Time: 09/24/19  5:58 AM  Result Value Ref Range   Glucose-Capillary 161 (H) 70 - 99 mg/dL    Blood Alcohol level:  Lab Results  Component Value Date   ETH <10 09/10/2019   ETH <10 75/91/6384    Metabolic Disorder Labs: Lab Results  Component Value Date   HGBA1C 9.9 (H) 09/12/2019   MPG 237.43 09/12/2019   MPG 134.11 03/09/2019   No results found for: PROLACTIN Lab Results  Component Value Date   CHOL 174 10/09/2018   TRIG 210 (H) 10/09/2018   HDL 44 10/09/2018   CHOLHDL 4.0 10/09/2018   LDLCALC 88 10/09/2018   LDLCALC 77 06/28/2018    Physical Findings: AIMS: Facial and Oral Movements Muscles of Facial Expression: None, normal Lips and Perioral Area: None, normal Jaw: None, normal Tongue: None, normal,Extremity Movements Upper (arms, wrists, hands, fingers): None, normal Lower (legs, knees, ankles, toes): None, normal, Trunk Movements Neck, shoulders, hips: None, normal, Overall Severity Severity of abnormal movements (highest score from questions above): None, normal Incapacitation due  to abnormal movements: None, normal Patient's awareness of abnormal movements (rate only patient's report): No Awareness, Dental Status Current problems with teeth and/or dentures?: No Does patient usually wear dentures?: No  CIWA:  CIWA-Ar Total: 1 COWS:  COWS Total Score: 2  Musculoskeletal: Strength & Muscle Tone: within normal limits Gait & Station: normal Patient leans: N/A  Psychiatric Specialty  Exam: Physical Exam  Nursing note and vitals reviewed. Constitutional: She is oriented to person, place, and time. She appears well-developed and well-nourished.  Cardiovascular: Normal rate.  Respiratory: Effort normal.  Neurological: She is alert and oriented to person, place, and time.    Review of Systems  Constitutional: Negative.   Respiratory: Negative for cough and shortness of breath.   Psychiatric/Behavioral: Positive for dysphoric mood. Negative for agitation, behavioral problems, hallucinations, self-injury, sleep disturbance and suicidal ideas. The patient is nervous/anxious.     Blood pressure 113/72, pulse (!) 113, temperature 97.8 F (36.6 C), resp. rate 18, height 5' 5.5" (1.664 m), weight 77.6 kg, SpO2 100 %.Body mass index is 28.02 kg/m.  General Appearance: Fairly Groomed  Eye Contact:  Good  Speech:  Normal Rate  Volume:  Normal  Mood:  Anxious  Affect:  Congruent  Thought Process:  Coherent  Orientation:  Full (Time, Place, and Person)  Thought Content:  Rumination  Suicidal Thoughts:  No  Homicidal Thoughts:  No  Memory:  Immediate;   Fair Recent;   Fair  Judgement:  Fair  Insight:  Lacking  Psychomotor Activity:  Decreased  Concentration:  Concentration: Fair and Attention Span: Poor  Recall:  [employer] of Knowledge:  Fair  Language:  Fair  Akathisia:  No  Handed:  Right  AIMS (if indicated):     Assets:  [employer]ed forces logistics/support/administrative officer Housing Leisure Time Social Support  ADL's:  Intact  Cognition:  WNL  Sleep:  Number of Hours: 6.5     Treatment Plan Summary: Daily contact with patient to assess and evaluate symptoms and progress in treatment and Medication management   Continue inpatient hospitalization.  Decrease Cymbalta to 20 mg PO daily (tapering off) Continue Luvox 75 mg PO QHS for anxiety Continue Fanapt 2 mg PO BID for anxiety Continue Ativan 1 mg PO TID for anxiety Continue Avapro 300 mg PO daily for HTN Continue Tessalon 200 mg PO  TID PRN cough Continue Lipitor 80 mg PO daily for HLD Continue Protonix 40 mg PO BID for reflux Continue Lantus 28 units SQ daily for diabetes Continue sliding scale insulin for diabetes Continue metformin 1000 mg PO daily for diabetes Continue ASA 81 mg PO daily for antiplatelet  Patient will participate in the therapeutic group milieu.  Discharge disposition in progress.   Connye Burkitt, NP 09/24/2019, 11:23 AM   Attest to NP Note

## 2019-09-24 NOTE — Plan of Care (Signed)
Nurse encouraged patient to attend groups and to participate in activities.  Discussed coping skills with patient.

## 2019-09-24 NOTE — Progress Notes (Signed)
D:  Patient denied SI and HI, contracts for safety.  Denied A/V hallucinations.  Denied pain. A:  Medications administered per MD orders.  Emotional support and encouragement given patient. R:  Safety maintained with 15 minute checks.

## 2019-09-24 NOTE — Tx Team (Signed)
Interdisciplinary Treatment and Diagnostic Plan Update  09/24/2019 Time of Session: 8:45am Jeanette Rose MRN: [id number]  Principal Diagnosis: MDD (major depressive disorder), recurrent severe, without psychosis ([place])  Secondary Diagnoses: Principal Problem:   MDD (major depressive disorder), recurrent severe, without psychosis ([place]) Active Problems:   Type 2 diabetes mellitus with diabetic polyneuropathy, with long-term current use of insulin (HCC)   Generalized anxiety disorder   Benzodiazepine dependence, continuous ([place])   Persistent depressive disorder with anxious distress, currently severe   Current Medications:  Current Facility-Administered Medications  Medication Dose Route Frequency Provider Last Rate Last Admin  . aspirin EC tablet 81 mg  81 mg Oral Daily Rankin, Shuvon B, NP   81 mg at 09/24/19 0758  . atorvastatin (LIPITOR) tablet 80 mg  80 mg Oral QHS Rankin, Shuvon B, NP   80 mg at 09/23/19 2146  . benzonatate (TESSALON) capsule 200 mg  200 mg Oral TID PRN Sharma Covert, MD   200 mg at 09/23/19 2146  . docusate sodium (COLACE) capsule 100 mg  100 mg Oral BID Rankin, Shuvon B, NP   100 mg at 09/24/19 0801  . DULoxetine (CYMBALTA) DR capsule 30 mg  30 mg Oral Daily Cobos, Myer Peer, MD   30 mg at 09/24/19 0758  . fluvoxaMINE (LUVOX) tablet 75 mg  75 mg Oral QHS Cobos, Myer Peer, MD   75 mg at 09/23/19 2200  . iloperidone (FANAPT) tablet 2 mg  2 mg Oral BID Cobos, Myer Peer, MD   2 mg at 09/24/19 0758  . insulin aspart (novoLOG) injection 0-9 Units  0-9 Units Subcutaneous TID WC Rankin, Shuvon B, NP   2 Units at 09/24/19 0708  . insulin glargine (LANTUS) injection 28 Units  28 Units Subcutaneous q morning - 10a Sharma Covert, MD   28 Units at 09/23/19 0935  . irbesartan (AVAPRO) tablet 300 mg  300 mg Oral Daily Sharma Covert, MD   300 mg at 09/24/19 0757  . LORazepam (ATIVAN) tablet 1 mg  1 mg Oral TID Sharma Covert, MD   1 mg at 09/24/19 0801  .  metFORMIN (GLUCOPHAGE-XR) 24 hr tablet 1,000 mg  1,000 mg Oral Q breakfast Rankin, Shuvon B, NP   1,000 mg at 09/24/19 0757  . miconazole (MICOTIN) 2 % cream   Topical BID Rankin, Shuvon B, NP   Given at 09/18/19 0759  . pantoprazole (PROTONIX) EC tablet 40 mg  40 mg Oral BID Sharma Covert, MD   40 mg at 09/24/19 0737   PTA Medications: Medications Prior to Admission  Medication Sig Dispense Refill Last Dose  . amantadine (SYMMETREL) 100 MG capsule Take 100 mg by mouth 2 (two) times daily.     . diazepam (VALIUM) 5 MG tablet Take 5-10 mg by mouth 3 (three) times daily as needed for anxiety.     . mirtazapine (REMERON) 15 MG tablet Take 15 mg by mouth at bedtime.     . ARIPiprazole (ABILIFY) 2 MG tablet Take 1 tablet by mouth daily.     Marland Kitchen atorvastatin (LIPITOR) 80 MG tablet Take 1 tablet (80 mg total) by mouth daily at 6 PM. 30 tablet 1   . Insulin Glargine, 2 Unit Dial, (TOUJEO MAX SOLOSTAR) 300 UNIT/ML SOPN Inject 72 Units into the skin every morning.     . metFORMIN (GLUCOPHAGE-XR) 500 MG 24 hr tablet Take 1,000 mg by mouth 2 (two) times a day.   Not Taking at Unknown time  .  olmesartan (BENICAR) 20 MG tablet Take 20 mg by mouth daily.     Marland Kitchen omeprazole (PRILOSEC) 40 MG capsule Take 40 mg by mouth daily.       Patient Stressors: Health problems Medication change or noncompliance  Patient Strengths: Average or above average intelligence General fund of knowledge Motivation for treatment/growth Supportive family/friends  Treatment Modalities: Medication Management, Group therapy, Case management,  1 to 1 session with clinician, Psychoeducation, Recreational therapy.   Physician Treatment Plan for Primary Diagnosis: MDD (major depressive disorder), recurrent severe, without psychosis ([place]) Long Term Goal(s): Improvement in symptoms so as ready for discharge Improvement in symptoms so as ready for discharge   Short Term Goals: Ability to identify changes in lifestyle to reduce  recurrence of condition will improve Ability to verbalize feelings will improve Ability to disclose and discuss suicidal ideas Ability to identify and develop effective coping behaviors will improve Ability to maintain clinical measurements within normal limits will improve Compliance with prescribed medications will improve  Medication Management: Evaluate patient's response, side effects, and tolerance of medication regimen.  Therapeutic Interventions: 1 to 1 sessions, Unit Group sessions and Medication administration.  Evaluation of Outcomes: Progressing  Physician Treatment Plan for Secondary Diagnosis: Principal Problem:   MDD (major depressive disorder), recurrent severe, without psychosis ([place]) Active Problems:   Type 2 diabetes mellitus with diabetic polyneuropathy, with long-term current use of insulin (HCC)   Generalized anxiety disorder   Benzodiazepine dependence, continuous (HCC)   Persistent depressive disorder with anxious distress, currently severe  Long Term Goal(s): Improvement in symptoms so as ready for discharge Improvement in symptoms so as ready for discharge   Short Term Goals: Ability to identify changes in lifestyle to reduce recurrence of condition will improve Ability to verbalize feelings will improve Ability to disclose and discuss suicidal ideas Ability to identify and develop effective coping behaviors will improve Ability to maintain clinical measurements within normal limits will improve Compliance with prescribed medications will improve     Medication Management: Evaluate patient's response, side effects, and tolerance of medication regimen.  Therapeutic Interventions: 1 to 1 sessions, Unit Group sessions and Medication administration.  Evaluation of Outcomes: Progressing   RN Treatment Plan for Primary Diagnosis: MDD (major depressive disorder), recurrent severe, without psychosis ([place]) Long Term Goal(s): Knowledge of disease and therapeutic  regimen to maintain health will improve  Short Term Goals: Ability to participate in decision making will improve, Ability to verbalize feelings will improve, Ability to disclose and discuss suicidal ideas and Ability to identify and develop effective coping behaviors will improve  Medication Management: RN will administer medications as ordered by provider, will assess and evaluate patient's response and provide education to patient for prescribed medication. RN will report any adverse and/or side effects to prescribing provider.  Therapeutic Interventions: 1 on 1 counseling sessions, Psychoeducation, Medication administration, Evaluate responses to treatment, Monitor vital signs and CBGs as ordered, Perform/monitor CIWA, COWS, AIMS and Fall Risk screenings as ordered, Perform wound care treatments as ordered.  Evaluation of Outcomes: Progressing   LCSW Treatment Plan for Primary Diagnosis: MDD (major depressive disorder), recurrent severe, without psychosis ([place]) Long Term Goal(s): Safe transition to appropriate next level of care at discharge, Engage patient in therapeutic group addressing interpersonal concerns.  Short Term Goals: Engage patient in aftercare planning with referrals and resources  Therapeutic Interventions: Assess for all discharge needs, 1 to 1 time with Social worker, Explore available resources and support systems, Assess for adequacy in community support network, Educate family  and significant other(s) on suicide prevention, Complete Psychosocial Assessment, Interpersonal group therapy.  Evaluation of Outcomes: Progressing   Progress in Treatment: Attending groups: No. New to unit  Participating in groups: No. Taking medication as prescribed: Yes. Toleration medication: Yes. Family/Significant other contact made: Yes, individual(s) contacted:  sister, Helene Kelp Patient understands diagnosis: Yes. Discussing patient identified problems/goals with staff: Yes. Medical  problems stabilized or resolved: Yes. Denies suicidal/homicidal ideation: Yes. Issues/concerns per patient self-inventory: No.  New problem(s) identified: Concerns of ability to live independently.  New Short Term/Long Term Goal(s):  medication stabilization, elimination of SI thoughts, development of comprehensive mental wellness plan.   Patient Goals:  "I just want to get better"  Discharge Plan or Barriers: Patient has been referred to [facility], but they are likely out of network. Patient is current with Dr.Kaur. CSW continuing to follow for safe discharge plan. Per Dr. Parke Poisson, MD he wants to refer the patient to Dr. Weber Cooks for ECT. CSW will continue to follow.   Reason for Continuation of Hospitalization: Anxiety Depression Medication stabilization Suicidal ideation  Estimated Length of Stay: 5-7 days  Attendees: Patient: Jeanette Rose 09/24/2019 9:22 AM  Physician: Dr. Myles Lipps, MD; Dr. Neita Garnet, MD 09/24/2019 9:22 AM  Nursing: Rise Paganini.Raliegh Ip, RN; Benjamine Mola.Jenetta Downer, RN 09/24/2019 9:22 AM  RN Care Manager: 09/24/2019 9:22 AM  Social Worker: [name], LCSW 09/24/2019 9:22 AM  Recreational Therapist:  09/24/2019 9:22 AM  Other: Harriett Sine, NP 09/24/2019 9:22 AM  Other:  09/24/2019 9:22 AM  Other: 09/24/2019 9:22 AM    Scribe for Treatment Team: Marylee Floras, [place] 09/24/2019 9:22 AM

## 2019-09-24 NOTE — Progress Notes (Signed)
   09/24/19 2348  Psych Admission Type (Psych Patients Only)  Admission Status Voluntary  Psychosocial Assessment  Patient Complaints Helplessness  Eye Contact Brief  Facial Expression Flat  Affect Appropriate to circumstance  Speech Logical/coherent  Interaction Minimal  Motor Activity Slow  Appearance/Hygiene Disheveled  Behavior Characteristics Unwilling to participate  Mood Helpless;Preoccupied  Aggressive Behavior  Effect No apparent injury  Thought Process  Coherency WDL  Content Obsessions  Delusions None reported or observed  Perception WDL  Hallucination None reported or observed  Judgment Poor  Confusion None  Danger to Self  Current suicidal ideation? Denies  Danger to Others  Danger to Others None reported or observed  D: Patient reports she has been in bed all day. Encouraged milieu participation but refused. Pt eager to come to med window for her medication. A: Medications administered as prescribed. Support and encouragement provided as needed.  R: Patient remains safe on the unit. Will continue to monitor for safety and stability.

## 2019-09-24 NOTE — Progress Notes (Signed)
Recreation Therapy Notes  Date:  12.2820 Time: 0930 Location: [address] Dayroom  Group Topic: Stress Management  Goal Area(s) Addresses:  Patient will identify positive stress management techniques. Patient will identify benefits of using stress management post d/c.  Intervention:  Stress Management  Activity :  Meditation.  LRT played a meditation the focused on pure possibility.  The meditation lead patients to focus on something they want to accomplish during they day.  Patients were to listen to the meditation and follow along as the meditation played to engage.  Education:  Stress Management, Discharge Planning.   Education Outcome: Acknowledges Education  Clinical Observations/Feedback:  Pt did not attend activity.     Victorino Sparrow, LRT/CTRS    Victorino Sparrow A 09/24/2019 11:41 AM

## 2019-09-25 LAB — GLUCOSE, CAPILLARY
Glucose-Capillary: 147 mg/dL — ABNORMAL HIGH (ref 70–99)
Glucose-Capillary: 172 mg/dL — ABNORMAL HIGH (ref 70–99)
Glucose-Capillary: 125 mg/dL — ABNORMAL HIGH (ref 70–99)
Glucose-Capillary: 132 mg/dL — ABNORMAL HIGH (ref 70–99)

## 2019-09-25 MED ORDER — FLUVOXAMINE MALEATE 100 MG PO TABS
100.0000 mg | ORAL_TABLET | Freq: Every day | ORAL | Status: DC
  Administered 2019-09-25 – 2019-09-26 (×2): 100 mg via ORAL
  Filled 2019-09-25 (×4): qty 1

## 2019-09-25 NOTE — Progress Notes (Signed)
   09/25/19 2353  Psych Admission Type (Psych Patients Only)  Admission Status Voluntary  Psychosocial Assessment  Patient Complaints Helplessness;Depression  Eye Contact Brief  Facial Expression Flat  Affect Anxious  Speech Logical/coherent  Interaction Minimal  Motor Activity Slow  Appearance/Hygiene Disheveled  Behavior Characteristics Unwilling to participate;Cooperative  Mood Depressed  Aggressive Behavior  Effect No apparent injury  Thought Process  Coherency WDL  Content Obsessions  Delusions None reported or observed  Perception WDL  Hallucination None reported or observed  Judgment Poor  Confusion None  Danger to Self  Current suicidal ideation? Denies  Danger to Others  Danger to Others None reported or observed  D: Patient in bed all evening refusing to participate in milieu. Pt reports she is afraid of everything including taking a shower. Pt reports she is not ready to discharge soon as discussed.  A: Medications administered as prescribed. Support and encouragement provided as needed.  R: Patient remains safe on the unit. Will continue to monitor for safety and stability.

## 2019-09-25 NOTE — Progress Notes (Signed)
[facility] MD Progress Note  09/25/2019 11:42 AM Jeanette Rose  MRN:  [id number] Subjective:  "I don't know."  Jeanette Rose found walking in the hallway. She continues to report that she is "scared" about not being able to do things such as walk, drive, shower, or take insulin. We reviewed that she had been completing these tasks prior to admission and is still able to manage ADLs in the hospital. She was administering insulin to herself at home but has been asking nursing staff to give her insulin in the hospital due to being "scared." I encouraged the use of coping skills as well as medication management for anxiety, but patient was not receptive. I reassured patient that she is able to complete tasks even when she is afraid, but she was not receptive. She continues to state she is afraid of going home and becomes agitated when discharge planning is mentioned. She is unwilling to consider ECT because "I don't have anyone to drive me there." She is also unwilling to consider therapy. She remains less tearful than earlier in hospitalization. She denies SI/HI/AVH. 6 hours of sleep recorded overnight.  From admission H&P:Patient is a 63 year old female with a past psychiatric history significant for major depression, generalized anxiety disorder, posttraumatic stress disorder and components of borderline personality disorder who presented to the [facility] emergency department on 09/10/2019 with suicidal ideation. The patient stated that she felt as though "all I think about his darkness in the end". The patient was significantly distraught during the interview. She was crying throughout.  Principal Problem: MDD (major depressive disorder), recurrent severe, without psychosis ([place]) Diagnosis: Principal Problem:   MDD (major depressive disorder), recurrent severe, without psychosis ([place]) Active Problems:   Type 2 diabetes mellitus with diabetic polyneuropathy, with long-term current use of  insulin (HCC)   Generalized anxiety disorder   Benzodiazepine dependence, continuous ([place])   Persistent depressive disorder with anxious distress, currently severe  Total Time spent with patient: 20 minutes  Past Psychiatric History: See admission H&P  Past Medical History:  Past Medical History:  Diagnosis Date  . Acute acalculous cholecystitis s/p lap chole 04/03/2014 04/02/2014  . Allergy   . Anemia   . Anxiety   . Asthma   . Depression   . Fatty liver    noted on CT per [facility] records  . Fibromyalgia   . GERD (gastroesophageal reflux disease)   . Hyperlipidemia associated with type 2 diabetes mellitus ([place])   . Hypertension   . Hypothyroid 2015   Transiently in 2015. Synthroid 83mg was stopped 05/07/2016 w/ tsh nml following.  . Obesity (BMI 30-39.9)   . OSA (obstructive sleep apnea)    non-compliant with CPAP  . Seborrheic dermatitis    [facility]  . St[facility] ([facility]    TIA  . Type 2 diabetes mellitus ([place]     Past Surgical History:  Procedure Laterality Date  . BREAST REDUCTION SURGERY  06/2008  . CHOLECYSTECTOMY N/A 04/03/2014   Procedure: LAPAROSCOPIC CHOLECYSTECTOMY ;  Surgeon: SAdin Hector MD;  Location: [facility] ORS;  Service: General;  Laterality: N/A;  . REDUCTION MAMMAPLASTY    . TUBAL LIGATION     Family History:  Family History  Problem Relation Age of Onset  . Atrial fibrillation Mother   . Heart disease Father        CAD  . Diabetes Brother   . Cancer Maternal Grandmother   . Diabetes Paternal Grandmother   .  Fibromyalgia Sister   . Colitis Sister    Family Psychiatric  History: See admission H&P Social History:  Social History   Substance and Sexual Activity  Alcohol Use No     Social History   Substance and Sexual Activity  Drug Use No    Social History   Socioeconomic History  . Marital status: Divorced    Spouse name: Not on file  . Number of children: Not on file  . Years of education: Not on  file  . Highest education level: Not on file  Occupational History  . Not on file  Tobacco Use  . Smoking status: Former Smoker    Packs/day: 4.00    Years: 15.00    Pack years: 60.00    Types: Cigarettes    Quit date: 04/22/2004    Years since quitting: 15.4  . Smokeless tobacco: Never Used  . Tobacco comment: 4 pack year hx  Substance and Sexual Activity  . Alcohol use: No  . Drug use: No  . Sexual activity: Never  Other Topics Concern  . Not on file  Social History Narrative  . Not on file   Social Determinants of Health   Financial Resource Strain:   . Difficulty of Paying Living Expenses: Not on file  Food Insecurity:   . Worried About Charity fundraiser in the Last Year: Not on file  . Ran Out of Food in the Last Year: Not on file  Transportation Needs:   . Lack of Transportation (Medical): Not on file  . Lack of Transportation (Non-Medical): Not on file  Physical Activity:   . Days of Exercise per Week: Not on file  . Minutes of Exercise per Session: Not on file  Stress:   . Feeling of Stress : Not on file  Social Connections:   . Frequency of Communication with Friends and Family: Not on file  . Frequency of Social Gatherings with Friends and Family: Not on file  . Attends Religious Services: Not on file  . Active Member of Clubs or Organizations: Not on file  . Attends Archivist Meetings: Not on file  . Marital Status: Not on file   Additional Social History:                         Sleep: Good  Appetite:  Good  Current Medications: Current Facility-Administered Medications  Medication Dose Route Frequency Provider Last Rate Last Admin  . aspirin EC tablet 81 mg  81 mg Oral Daily Rankin, Shuvon B, NP   81 mg at 09/25/19 0802  . atorvastatin (LIPITOR) tablet 80 mg  80 mg Oral QHS Rankin, Shuvon B, NP   80 mg at 09/24/19 2100  . benzonatate (TESSALON) capsule 200 mg  200 mg Oral TID PRN Sharma Covert, MD   200 mg at 09/24/19  2103  . docusate sodium (COLACE) capsule 100 mg  100 mg Oral BID Rankin, Shuvon B, NP   100 mg at 09/25/19 0802  . fluvoxaMINE (LUVOX) tablet 100 mg  100 mg Oral QHS Sharma Covert, MD      . iloperidone (FANAPT) tablet 2 mg  2 mg Oral BID Cobos, Myer Peer, MD   2 mg at 09/25/19 0940  . insulin aspart (novoLOG) injection 0-9 Units  0-9 Units Subcutaneous TID WC Rankin, Shuvon B, NP   1 Units at 09/25/19 0651  . insulin glargine (LANTUS) injection 28 Units  28 Units  Subcutaneous q morning - 10a Sharma Covert, MD   28 Units at 09/25/19 [phone]  . irbesartan (AVAPRO) tablet 300 mg  300 mg Oral Daily Sharma Covert, MD   300 mg at 09/25/19 0801  . LORazepam (ATIVAN) tablet 1 mg  1 mg Oral TID Sharma Covert, MD   1 mg at 09/25/19 0802  . metFORMIN (GLUCOPHAGE-XR) 24 hr tablet 1,000 mg  1,000 mg Oral Q breakfast Rankin, Shuvon B, NP   1,000 mg at 09/25/19 0800  . miconazole (MICOTIN) 2 % cream   Topical BID Rankin, Shuvon B, NP   Given at 09/18/19 0759  . pantoprazole (PROTONIX) EC tablet 40 mg  40 mg Oral BID Sharma Covert, MD   40 mg at 09/25/19 0802    Lab Results:  Results for orders placed or performed during the hospital encounter of 09/11/19 (from the past 48 hour(s))  Glucose, capillary     Status: Abnormal   Collection Time: 09/23/19 11:49 AM  Result Value Ref Range   Glucose-Capillary 160 (H) 70 - 99 mg/dL  Glucose, capillary     Status: Abnormal   Collection Time: 09/23/19  5:00 PM  Result Value Ref Range   Glucose-Capillary 134 (H) 70 - 99 mg/dL  Glucose, capillary     Status: Abnormal   Collection Time: 09/23/19  7:57 PM  Result Value Ref Range   Glucose-Capillary 191 (H) 70 - 99 mg/dL  Glucose, capillary     Status: Abnormal   Collection Time: 09/24/19  5:58 AM  Result Value Ref Range   Glucose-Capillary 161 (H) 70 - 99 mg/dL  Glucose, capillary     Status: Abnormal   Collection Time: 09/24/19 11:30 AM  Result Value Ref Range   Glucose-Capillary 166 (H)  70 - 99 mg/dL  Glucose, capillary     Status: Abnormal   Collection Time: 09/24/19  5:11 PM  Result Value Ref Range   Glucose-Capillary 107 (H) 70 - 99 mg/dL  Glucose, capillary     Status: Abnormal   Collection Time: 09/24/19  7:48 PM  Result Value Ref Range   Glucose-Capillary 172 (H) 70 - 99 mg/dL  Glucose, capillary     Status: Abnormal   Collection Time: 09/25/19  5:46 AM  Result Value Ref Range   Glucose-Capillary 147 (H) 70 - 99 mg/dL    Blood Alcohol level:  Lab Results  Component Value Date   ETH <10 09/10/2019   ETH <10 03/47/4259    Metabolic Disorder Labs: Lab Results  Component Value Date   HGBA1C 9.9 (H) 09/12/2019   MPG 237.43 09/12/2019   MPG 134.11 03/09/2019   No results found for: PROLACTIN Lab Results  Component Value Date   CHOL 174 10/09/2018   TRIG 210 (H) 10/09/2018   HDL 44 10/09/2018   CHOLHDL 4.0 10/09/2018   LDLCALC 88 10/09/2018   LDLCALC 77 06/28/2018    Physical Findings: AIMS: Facial and Oral Movements Muscles of Facial Expression: None, normal Lips and Perioral Area: None, normal Jaw: None, normal Tongue: None, normal,Extremity Movements Upper (arms, wrists, hands, fingers): None, normal Lower (legs, knees, ankles, toes): None, normal, Trunk Movements Neck, shoulders, hips: None, normal, Overall Severity Severity of abnormal movements (highest score from questions above): None, normal Incapacitation due to abnormal movements: None, normal Patient's awareness of abnormal movements (rate only patient's report): No Awareness, Dental Status Current problems with teeth and/or dentures?: No Does patient usually wear dentures?: No  CIWA:  CIWA-Ar Total: 1  COWS:  COWS Total Score: 3  Musculoskeletal: Strength & Muscle Tone: within normal limits Gait & Station: normal Patient leans: N/A  Psychiatric Specialty Exam: Physical Exam  Review of Systems  Blood pressure 126/62, pulse (!) 102, temperature 97.8 F (36.6 C), resp. rate  18, height 5' 5.5" (1.664 m), weight 77.6 kg, SpO2 100 %.Body mass index is 28.02 kg/m.  General Appearance: Fairly Groomed  Eye Contact:  Fair  Speech:  Normal Rate  Volume:  Normal  Mood:  Anxious  Affect:  Congruent  Thought Process:  Coherent  Orientation:  Full (Time, Place, and Person)  Thought Content:  Rumination  Suicidal Thoughts:  No  Homicidal Thoughts:  No  Memory:  Immediate;   Fair Recent;   Fair  Judgement:  Poor  Insight:  Lacking  Psychomotor Activity:  Normal  Concentration:  Concentration: Fair and Attention Span: Poor  Recall:  [employer] of Knowledge:  Fair  Language:  Fair  Akathisia:  No  Handed:  Right  AIMS (if indicated):     Assets:  Armed forces logistics/support/administrative officer Housing Leisure Time Social Support  ADL's:  Intact  Cognition:  WNL  Sleep:  Number of Hours: 6     Treatment Plan Summary: Daily contact with patient to assess and evaluate symptoms and progress in treatment and Medication management   Continue inpatient hospitalization.  Discontinue Cymbalta Increase Luvox to 100 mg PO QHS for anxiety Continue Fanapt 2 mg PO BID for anxiety ContinueAtivan 1 mg PO TID for anxiety Continue Avapro 300 mg PO daily for HTN Continue Tessalon 200 mg PO TID PRN cough Continue Lipitor 80 mg PO daily for HLD Continue Protonix 40 mg PO BID for reflux Continue Lantus 28 units SQ daily for diabetes Continue sliding scale insulin for diabetes Continue metformin 1000 mg PO daily for diabetes Continue ASA 81 mg PO daily for antiplatelet  Patient will participate in the therapeutic group milieu.  Discharge disposition in progress.  Connye Burkitt, NP 09/25/2019, 11:42 AM

## 2019-09-25 NOTE — Progress Notes (Signed)
   09/25/19 1000  Psych Admission Type (Psych Patients Only)  Admission Status Voluntary  Psychosocial Assessment  Patient Complaints Helplessness  Eye Contact Brief  Facial Expression Flat  Affect Anxious  Speech Logical/coherent  Interaction Minimal  Motor Activity Slow  Appearance/Hygiene Unremarkable  Behavior Characteristics Anxious;Unwilling to participate  Mood Helpless  Aggressive Behavior  Effect No apparent injury  Thought Process  Coherency WDL  Content Obsessions  Delusions None reported or observed  Perception WDL  Hallucination None reported or observed  Judgment Poor  Confusion None  Danger to Self  Current suicidal ideation? Denies  Danger to Others  Danger to Others None reported or observed   Pt is cautious and reports feeling scared to go home, drive, give herself medication and other daily activities. She isolates to her room Pt refused to give her herself insulin with encouragement even though she gives herself insulin at home. Writer gave as ordered. Pt denies feeling suicidal or homicidal. Safety maintained on the unit.

## 2019-09-26 LAB — GLUCOSE, CAPILLARY
Glucose-Capillary: 130 mg/dL — ABNORMAL HIGH (ref 70–99)
Glucose-Capillary: 182 mg/dL — ABNORMAL HIGH (ref 70–99)
Glucose-Capillary: 144 mg/dL — ABNORMAL HIGH (ref 70–99)
Glucose-Capillary: 186 mg/dL — ABNORMAL HIGH (ref 70–99)

## 2019-09-26 NOTE — Progress Notes (Signed)
CSW is currently assessing for possible home health aid services (aid services only, due to patient not meeting criteria for skilled services). Agencies contacted so far:  [facility] - not in network with the patient's insurance provider [facility] - patient would be a "private pay" consumer due to criteria not requiring skilled services. Per intake, the patient's insurance does not cover for "aid services" only.  [place] - left voicemail requesting a phone call back.    CSW will continue to follow   [name], MSW, [place] Worker [facility]  Phone: [phone]

## 2019-09-26 NOTE — Progress Notes (Signed)
CSW spoke with the patient's sister, Jeanette Rose ([phone]) to discuss the patient's discharge planning.   CSW shared with Jeanette Rose that the attending psychiatrist was planning to discharge the patient tomorrow. Jeanette Rose shared that she and the patient's family were able to finalize the patient's new home and have moved all of her belongings to her new home. She states that she was currently working on having the patient's cable and light services transferred today.   Jeanette Rose reports that she was agreeable to the patient returning home, however she would like CSW to refer her sister to a possible home health agency, who could assist the patient at her home. She also asked if CSW could refer the patient to a new psychiatrist for medication management. CSW shared that referrals would be made today. CSW will continue to follow.    Dr. Myles Lipps, MD notified [name], RN notified.   [name], MSW, LCSW Clinical Social Worker [facility]  Phone: [phone]

## 2019-09-26 NOTE — Progress Notes (Signed)
Recreation Therapy Notes  Date:  12.30.20 Time: 0930 Location: 300 Hall Dayroom  Group Topic: Stress Management  Goal Area(s) Addresses:  Patient will identify positive stress management techniques. Patient will identify benefits of using stress management post d/c.  Intervention: Stress Management  Activity :  Guided Imagery.  LRT read a script that guided patients on a journey to their peaceful place.  Patients were to envision all of the things their peaceful place has to offer.  Patients were to listen and follow along as script was read.  Education:  Stress Management, Discharge Planning.   Education Outcome: Acknowledges Education  Clinical Observations/Feedback: Pt did not attend group activity.    Victorino Sparrow, LRT/CTRS         Victorino Sparrow A 09/26/2019 11:03 AM

## 2019-09-26 NOTE — Progress Notes (Signed)
[facility] MD Progress Note  09/26/2019 11:00 AM Jeanette Rose  MRN:  [id number] Subjective: Patient is a 63 year old female with a past psychiatric history significant for major depression, generalized anxiety disorder, posttraumatic stress disorder as well as components of borderline personality disorder who presented to the [facility] emergency department on 09/10/2019 with suicidal ideation.  Objective: Patient is seen and examined.  Patient is a 63 year old female with the above-stated past psychiatric history who is seen in follow-up.  Patient is familiar to me from her admission earlier this month.  She stated that she does not believe the medications are working.  She stated that although her anxiety is decreased, and she is no longer crying she still is unable to care for herself.  She stated she is unable to get herself into the shower, she is unable to give herself insulin, she is unable to take care of herself, pay her bills or any other activity.  We discussed the fact that her sister is preparing a new home for her.  She stated today when we were discussing discharge plans for either today or tomorrow that she was not ready, and the Dr. Parke Poisson told her she would be able to stay longer.  I discussed with her that she is not suicidal, that she is not crying as she had been, that she had declined ECT or outpatient ECT, and that it would take time and medication adjustments to get her continued improvement.  She stated she was not ready to leave, although she did deny suicidal ideation.  When we discussed discharge she stated that she wanted to go back on her other medications.  She stated that she felt better when she was able to cry.  I told her that given that she was going to be discharged in the next day or so it would not be wise to change those medications at this point.  Social work is contacted her sister, and plans are being made for the sister to pick her up and take her to her  new dwelling tomorrow.  Her blood pressure is elevated this morning at 153/66.  Heart rates 98, she is afebrile.  She slept 6 hours last night.  Her blood sugar this morning is 130.  Principal Problem: MDD (major depressive disorder), recurrent severe, without psychosis ([place]) Diagnosis: Principal Problem:   MDD (major depressive disorder), recurrent severe, without psychosis ([place]) Active Problems:   Type 2 diabetes mellitus with diabetic polyneuropathy, with long-term current use of insulin (HCC)   Generalized anxiety disorder   Benzodiazepine dependence, continuous ([place])   Persistent depressive disorder with anxious distress, currently severe  Total Time spent with patient: 30 minutes  Past Psychiatric History: See admission H&P  Past Medical History:  Past Medical History:  Diagnosis Date  . Acute acalculous cholecystitis s/p lap chole 04/03/2014 04/02/2014  . Allergy   . Anemia   . Anxiety   . Asthma   . Depression   . Fatty liver    noted on CT per [facility] records  . Fibromyalgia   . GERD (gastroesophageal reflux disease)   . Hyperlipidemia associated with type 2 diabetes mellitus ([place])   . Hypertension   . Hypothyroid 2015   Transiently in 2015. Synthroid 24mg was stopped 05/07/2016 w/ tsh nml following.  . Obesity (BMI 30-39.9)   . OSA (obstructive sleep apnea)    non-compliant with CPAP  . Seborrheic dermatitis    [facility]  .  Stroke [facility])    TIA  . Type 2 diabetes mellitus ([place])     Past Surgical History:  Procedure Laterality Date  . BREAST REDUCTION SURGERY  06/2008  . CHOLECYSTECTOMY N/A 04/03/2014   Procedure: LAPAROSCOPIC CHOLECYSTECTOMY ;  Surgeon: Adin Hector, MD;  Location: [facility] ORS;  Service: General;  Laterality: N/A;  . REDUCTION MAMMAPLASTY    . TUBAL LIGATION     Family History:  Family History  Problem Relation Age of Onset  . Atrial fibrillation Mother   . Heart disease Father        CAD  . Diabetes  Brother   . Cancer Maternal Grandmother   . Diabetes Paternal Grandmother   . Fibromyalgia Sister   . Colitis Sister    Family Psychiatric  History: See admission H&P Social History:  Social History   Substance and Sexual Activity  Alcohol Use No     Social History   Substance and Sexual Activity  Drug Use No    Social History   Socioeconomic History  . Marital status: Divorced    Spouse name: Not on file  . Number of children: Not on file  . Years of education: Not on file  . Highest education level: Not on file  Occupational History  . Not on file  Tobacco Use  . Smoking status: Former Smoker    Packs/day: 4.00    Years: 15.00    Pack years: 60.00    Types: Cigarettes    Quit date: 04/22/2004    Years since quitting: 15.4  . Smokeless tobacco: Never Used  . Tobacco comment: 4 pack year hx  Substance and Sexual Activity  . Alcohol use: No  . Drug use: No  . Sexual activity: Never  Other Topics Concern  . Not on file  Social History Narrative  . Not on file   Social Determinants of Health   Financial Resource Strain:   . Difficulty of Paying Living Expenses: Not on file  Food Insecurity:   . Worried About Charity fundraiser in the Last Year: Not on file  . Ran Out of Food in the Last Year: Not on file  Transportation Needs:   . Lack of Transportation (Medical): Not on file  . Lack of Transportation (Non-Medical): Not on file  Physical Activity:   . Days of Exercise per Week: Not on file  . Minutes of Exercise per Session: Not on file  Stress:   . Feeling of Stress : Not on file  Social Connections:   . Frequency of Communication with Friends and Family: Not on file  . Frequency of Social Gatherings with Friends and Family: Not on file  . Attends Religious Services: Not on file  . Active Member of Clubs or Organizations: Not on file  . Attends Archivist Meetings: Not on file  . Marital Status: Not on file   Additional Social History:                          Sleep: Fair  Appetite:  Fair  Current Medications: Current Facility-Administered Medications  Medication Dose Route Frequency Provider Last Rate Last Admin  . aspirin EC tablet 81 mg  81 mg Oral Daily Rankin, Shuvon B, NP   81 mg at 09/26/19 0936  . atorvastatin (LIPITOR) tablet 80 mg  80 mg Oral QHS Rankin, Shuvon B, NP   80 mg at 09/25/19 2135  . benzonatate (TESSALON) capsule 200  mg  200 mg Oral TID PRN Sharma Covert, MD   200 mg at 09/25/19 2140  . docusate sodium (COLACE) capsule 100 mg  100 mg Oral BID Rankin, Shuvon B, NP   100 mg at 09/26/19 0936  . fluvoxaMINE (LUVOX) tablet 100 mg  100 mg Oral QHS Sharma Covert, MD   100 mg at 09/25/19 2135  . Iloperidone TABS 2 mg  2 mg Oral BID Cobos, Myer Peer, MD   2 mg at 09/26/19 0936  . insulin aspart (novoLOG) injection 0-9 Units  0-9 Units Subcutaneous TID WC Rankin, Shuvon B, NP   1 Units at 09/26/19 0653  . insulin glargine (LANTUS) injection 28 Units  28 Units Subcutaneous q morning - 10a Sharma Covert, MD   28 Units at 09/26/19 0940  . irbesartan (AVAPRO) tablet 300 mg  300 mg Oral Daily Sharma Covert, MD   300 mg at 09/26/19 0935  . LORazepam (ATIVAN) tablet 1 mg  1 mg Oral TID Sharma Covert, MD   1 mg at 09/26/19 0936  . metFORMIN (GLUCOPHAGE-XR) 24 hr tablet 1,000 mg  1,000 mg Oral Q breakfast Rankin, Shuvon B, NP   1,000 mg at 09/26/19 0934  . miconazole (MICOTIN) 2 % cream   Topical BID Rankin, Shuvon B, NP   Given at 09/18/19 0759  . pantoprazole (PROTONIX) EC tablet 40 mg  40 mg Oral BID Sharma Covert, MD   40 mg at 09/26/19 9242    Lab Results:  Results for orders placed or performed during the hospital encounter of 09/11/19 (from the past 48 hour(s))  Glucose, capillary     Status: Abnormal   Collection Time: 09/24/19 11:30 AM  Result Value Ref Range   Glucose-Capillary 166 (H) 70 - 99 mg/dL  Glucose, capillary     Status: Abnormal   Collection Time: 09/24/19   5:11 PM  Result Value Ref Range   Glucose-Capillary 107 (H) 70 - 99 mg/dL  Glucose, capillary     Status: Abnormal   Collection Time: 09/24/19  7:48 PM  Result Value Ref Range   Glucose-Capillary 172 (H) 70 - 99 mg/dL  Glucose, capillary     Status: Abnormal   Collection Time: 09/25/19  5:46 AM  Result Value Ref Range   Glucose-Capillary 147 (H) 70 - 99 mg/dL  Glucose, capillary     Status: Abnormal   Collection Time: 09/25/19 11:59 AM  Result Value Ref Range   Glucose-Capillary 172 (H) 70 - 99 mg/dL  Glucose, capillary     Status: Abnormal   Collection Time: 09/25/19  4:59 PM  Result Value Ref Range   Glucose-Capillary 125 (H) 70 - 99 mg/dL  Glucose, capillary     Status: Abnormal   Collection Time: 09/25/19  8:26 PM  Result Value Ref Range   Glucose-Capillary 132 (H) 70 - 99 mg/dL   Comment 1 Notify RN   Glucose, capillary     Status: Abnormal   Collection Time: 09/26/19  6:08 AM  Result Value Ref Range   Glucose-Capillary 130 (H) 70 - 99 mg/dL   Comment 1 Notify RN     Blood Alcohol level:  Lab Results  Component Value Date   ETH <10 09/10/2019   ETH <10 68/34/1962    Metabolic Disorder Labs: Lab Results  Component Value Date   HGBA1C 9.9 (H) 09/12/2019   MPG 237.43 09/12/2019   MPG 134.11 03/09/2019   No results found for: PROLACTIN Lab Results  Component Value Date   CHOL 174 10/09/2018   TRIG 210 (H) 10/09/2018   HDL 44 10/09/2018   CHOLHDL 4.0 10/09/2018   LDLCALC 88 10/09/2018   LDLCALC 77 06/28/2018    Physical Findings: AIMS: Facial and Oral Movements Muscles of Facial Expression: None, normal Lips and Perioral Area: None, normal Jaw: None, normal Tongue: None, normal,Extremity Movements Upper (arms, wrists, hands, fingers): None, normal Lower (legs, knees, ankles, toes): None, normal, Trunk Movements Neck, shoulders, hips: None, normal, Overall Severity Severity of abnormal movements (highest score from questions above): None,  normal Incapacitation due to abnormal movements: None, normal Patient's awareness of abnormal movements (rate only patient's report): No Awareness, Dental Status Current problems with teeth and/or dentures?: No Does patient usually wear dentures?: No  CIWA:  CIWA-Ar Total: 1 COWS:  COWS Total Score: 3  Musculoskeletal: Strength & Muscle Tone: within normal limits Gait & Station: normal Patient leans: N/A  Psychiatric Specialty Exam: Physical Exam  Nursing note and vitals reviewed. Constitutional: She is oriented to person, place, and time. She appears well-developed and well-nourished.  HENT:  Head: Normocephalic and atraumatic.  Respiratory: Effort normal.  Neurological: She is alert and oriented to person, place, and time.    Review of Systems  Blood pressure (!) 153/66, pulse 98, temperature 98 F (36.7 C), resp. rate 18, height 5' 5.5" (1.664 m), weight 77.6 kg, SpO2 100 %.Body mass index is 28.02 kg/m.  General Appearance: Casual  Eye Contact:  Minimal  Speech:  Normal Rate  Volume:  Increased  Mood:  Anxious and Dysphoric  Affect:  Congruent  Thought Process:  Coherent and Descriptions of Associations: Circumstantial  Orientation:  Full (Time, Place, and Person)  Thought Content:  Illogical  Suicidal Thoughts:  No  Homicidal Thoughts:  No  Memory:  Immediate;   Fair Recent;   Fair Remote;   Fair  Judgement:  Impaired  Insight:  Lacking  Psychomotor Activity:  Increased  Concentration:  Concentration: Fair and Attention Span: Fair  Recall:  [employer] of Knowledge:  Fair  Language:  Good  Akathisia:  Negative  Handed:  Right  AIMS (if indicated):     Assets:  Desire for Improvement Housing Resilience Social Support Transportation  ADL's:  Intact  Cognition:  WNL  Sleep:  Number of Hours: 6     Treatment Plan Summary: Daily contact with patient to assess and evaluate symptoms and progress in treatment, Medication management and Plan : Patient is seen  and examined.  Patient is a 63 year old female with the above-stated past psychiatric history is seen in follow-up.   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  We discussed today that discharge will take place either this afternoon or tomorrow morning.  She will be discharged with her sister who will take her to her new living circumstances.  We have attempted to get the patient into a group home as well as assisted living.  We were unable to do either 1 of these because the limitations secondary to her insurance.  She is declined ECT or outpatient ECT.  She is not suicidal, and seems to be doing much better with regard to the Luvox versus the Cymbalta.  She is unable to recognize this at this point.  We will plan on discharge tomorrow hopefully.  No change in her medications today.  1.  Continue an aspirin 81 mg p.o. daily for heart health. 2.  Continue Lipitor 80 mg p.o. nightly for hyperlipidemia. 3.  Stop Tessalon capsules. 4.  Continue Colace 100 mg p.o. twice daily for constipation. 5.  Continue Luvox 100 mg p.o. nightly which was increased yesterday for mood, anxiety and obsessional thinking. 6.  Continue Fanapt 2 mg p.o. twice daily for depression augmentation treatment. 5.  Continue sliding scale insulin 3 times daily for now.  This is for poorly controlled diabetes mellitus. 6.  Continue Lantus insulin 28 units subcu every morning for diabetes mellitus. 7.  Continue Avapro 300 mg p.o. daily for hypertension. 8.  Continue lorazepam 1 mg p.o. 3 times daily for anxiety. 9.  Continue Metformin extended release at 1000 mg p.o. daily for diabetes mellitus. 9.  Continue miconazole 2% cream applied to external vaginal area secondary to yeast infection. 10.  Continue Protonix 40 mg  p.o. daily twice daily for reflux symptoms. 11.  Disposition planning-in progress.   Sharma Covert, MD 09/26/2019, 11:00 AM

## 2019-09-26 NOTE — Progress Notes (Signed)
   09/26/19 1200  Psych Admission Type (Psych Patients Only)  Admission Status Voluntary  Psychosocial Assessment  Patient Complaints Anxiety;Depression;Worrying  Eye Contact Brief  Facial Expression Sad  Affect Depressed;Anxious;Preoccupied  Speech Logical/coherent  Interaction Minimal  Motor Activity Slow  Appearance/Hygiene Disheveled  Behavior Characteristics Anxious  Mood Anxious;Depressed;Preoccupied  Aggressive Behavior  Effect No apparent injury  Thought Pension scheme manager thinking  Content Obsessions;Preoccupation  Delusions None reported or observed  Perception Derealization  Hallucination None reported or observed  Judgment Poor  Confusion Mild  Danger to Self  Current suicidal ideation? Denies  Danger to Others  Danger to Others None reported or observed

## 2019-09-26 NOTE — [facility] Counselor (Signed)
CSW spoke with patient's sister, [name] [phone]). Helene Kelp was updated regarding patient's outpatient follow up appointments and that patient does not meet criteria for Home Health services to be covered by insurance.   Sister was informed that [facility] and [facility] are both able to offer Home Health services at private pay. Sister verbalized understanding.  Sister also inquired about following up with or resuming ECT treatments. CSW explained that this would require travel to [facility], sister is aware and agreeable. Referral information will be provided prior to discharge.  Stephanie Acre, MSW, [place] Social Worker [facility]  [phone]

## 2019-09-27 LAB — GLUCOSE, CAPILLARY
Glucose-Capillary: 166 mg/dL — ABNORMAL HIGH (ref 70–99)
Glucose-Capillary: 192 mg/dL — ABNORMAL HIGH (ref 70–99)
Glucose-Capillary: 191 mg/dL — ABNORMAL HIGH (ref 70–99)

## 2019-09-27 MED ORDER — ASPIRIN 81 MG PO TBEC: 81.0000 mg | DELAYED_RELEASE_TABLET | Freq: Every day | ORAL | 0 refills | Status: AC

## 2019-09-27 MED ORDER — IRBESARTAN 300 MG PO TABS: 300.0000 mg | ORAL_TABLET | Freq: Every day | ORAL | 0 refills | Status: AC

## 2019-09-27 MED ORDER — DOCUSATE SODIUM 100 MG PO CAPS: 100.0000 mg | ORAL_CAPSULE | Freq: Two times a day (BID) | ORAL | 0 refills | Status: AC

## 2019-09-27 MED ORDER — METFORMIN HCL ER (OSM) 1000 MG PO TB24: 1000.0000 mg | ORAL_TABLET | Freq: Every day | ORAL | 0 refills | Status: DC

## 2019-09-27 MED ORDER — PANTOPRAZOLE SODIUM 40 MG PO TBEC: 40.0000 mg | DELAYED_RELEASE_TABLET | Freq: Two times a day (BID) | ORAL | 0 refills | Status: AC

## 2019-09-27 MED ORDER — ATORVASTATIN CALCIUM 80 MG PO TABS: 80.0000 mg | ORAL_TABLET | Freq: Every day | ORAL | 0 refills | Status: DC

## 2019-09-27 MED ORDER — FLUVOXAMINE MALEATE 100 MG PO TABS: 100.0000 mg | ORAL_TABLET | Freq: Every day | ORAL | 0 refills | Status: DC

## 2019-09-27 MED ORDER — INSULIN GLARGINE 100 UNIT/ML ~~LOC~~ SOLN: 28.0000 [IU] | Freq: Every morning | SUBCUTANEOUS | 0 refills | Status: DC

## 2019-09-27 MED ORDER — ILOPERIDONE 2 MG PO TABS: 2.0000 mg | ORAL_TABLET | Freq: Two times a day (BID) | ORAL | 0 refills | Status: DC

## 2019-09-27 NOTE — Progress Notes (Signed)
   09/27/19 0011  Psych Admission Type (Psych Patients Only)  Admission Status Voluntary  Psychosocial Assessment  Patient Complaints Helplessness;Worrying  Eye Contact Brief  Facial Expression Sad  Affect Depressed;Anxious;Preoccupied  Speech Logical/coherent  Interaction Minimal  Motor Activity Slow  Appearance/Hygiene Disheveled  Behavior Characteristics Unwilling to participate  Mood Preoccupied;Depressed  Aggressive Behavior  Effect No apparent injury  Thought Pension scheme manager thinking  Content Obsessions;Preoccupation  Delusions None reported or observed  Perception Derealization  Hallucination None reported or observed  Judgment Poor  Confusion Mild  Danger to Self  Current suicidal ideation? Denies  Danger to Others  Danger to Others None reported or observed  D: Patient in bed all evening refusing to participate in milieu. Pt reports she is afraid of everything including taking a shower. Pt reports she is not ready to discharge because she can't do anything for herself.  A: Medications administered as prescribed. Support and encouragement provided as needed.  R: Patient remains safe on the unit. Will continue to monitor for safety and stability.

## 2019-09-27 NOTE — Plan of Care (Signed)
Discharge note  Patient verbalizes readiness for discharge. Follow up plan explained, AVS, Transition record and SRA given. Prescriptions and teaching provided. Belongings returned and signed for. Suicide safety plan completed and signed. Patient verbalizes understanding. Patient denies SI/HI and assures this Probation officer they will seek assistance should that change. Patient discharged to lobby where sister was waiting.  Problem: Education: Goal: Ability to state activities that reduce stress will improve Outcome: Adequate for Discharge   Problem: Coping: Goal: Ability to identify and develop effective coping behavior will improve Outcome: Adequate for Discharge   Problem: Self-Concept: Goal: Ability to identify factors that promote anxiety will improve Outcome: Adequate for Discharge Goal: Level of anxiety will decrease Outcome: Adequate for Discharge Goal: Ability to modify response to factors that promote anxiety will improve Outcome: Adequate for Discharge   Problem: Education: Goal: Utilization of techniques to improve thought processes will improve Outcome: Adequate for Discharge Goal: Knowledge of the prescribed therapeutic regimen will improve Outcome: Adequate for Discharge   Problem: Activity: Goal: Interest or engagement in leisure activities will improve Outcome: Adequate for Discharge Goal: Imbalance in normal sleep/wake cycle will improve Outcome: Adequate for Discharge   Problem: Coping: Goal: Coping ability will improve Outcome: Adequate for Discharge Goal: Will verbalize feelings Outcome: Adequate for Discharge   Problem: Health Behavior/Discharge Planning: Goal: Ability to make decisions will improve Outcome: Adequate for Discharge Goal: Compliance with therapeutic regimen will improve Outcome: Adequate for Discharge   Problem: Role Relationship: Goal: Will demonstrate positive changes in social behaviors and relationships Outcome: Adequate for Discharge    Problem: Safety: Goal: Ability to disclose and discuss suicidal ideas will improve Outcome: Adequate for Discharge Goal: Ability to identify and utilize support systems that promote safety will improve Outcome: Adequate for Discharge   Problem: Self-Concept: Goal: Will verbalize positive feelings about self Outcome: Adequate for Discharge Goal: Level of anxiety will decrease Outcome: Adequate for Discharge   Problem: Education: Goal: Knowledge of [place] [facility] Education information/materials will improve Outcome: Adequate for Discharge Goal: Emotional status will improve Outcome: Adequate for Discharge Goal: Mental status will improve Outcome: Adequate for Discharge Goal: Verbalization of understanding the information provided will improve Outcome: Adequate for Discharge   Problem: Activity: Goal: Interest or engagement in activities will improve Outcome: Adequate for Discharge Goal: Sleeping patterns will improve Outcome: Adequate for Discharge   Problem: Coping: Goal: Ability to verbalize frustrations and anger appropriately will improve Outcome: Adequate for Discharge Goal: Ability to demonstrate self-control will improve Outcome: Adequate for Discharge   Problem: Health Behavior/Discharge Planning: Goal: Identification of resources available to assist in meeting health care needs will improve Outcome: Adequate for Discharge Goal: Compliance with treatment plan for underlying cause of condition will improve Outcome: Adequate for Discharge   Problem: Physical Regulation: Goal: Ability to maintain clinical measurements within normal limits will improve Outcome: Adequate for Discharge   Problem: Safety: Goal: Periods of time without injury will increase Outcome: Adequate for Discharge   Problem: Education: Goal: Ability to make informed decisions regarding treatment will improve Outcome: Adequate for Discharge   Problem: Coping: Goal: Coping ability will  improve Outcome: Adequate for Discharge   Problem: Health Behavior/Discharge Planning: Goal: Identification of resources available to assist in meeting health care needs will improve Outcome: Adequate for Discharge   Problem: Medication: Goal: Compliance with prescribed medication regimen will improve Outcome: Adequate for Discharge   Problem: Self-Concept: Goal: Ability to disclose and discuss suicidal ideas will improve Outcome: Adequate for Discharge Goal: Will verbalize positive  feelings about self Outcome: Adequate for Discharge

## 2019-09-27 NOTE — BHH Suicide Risk Assessment (Signed)
[facility] Discharge Suicide Risk Assessment   Principal Problem: MDD (major depressive disorder), recurrent severe, without psychosis ([place]) Discharge Diagnoses: Principal Problem:   MDD (major depressive disorder), recurrent severe, without psychosis ([place]) Active Problems:   Type 2 diabetes mellitus with diabetic polyneuropathy, with long-term current use of insulin (HCC)   Generalized anxiety disorder   Benzodiazepine dependence, continuous (HCC)   Persistent depressive disorder with anxious distress, currently severe   Total Time spent with patient: 20 minutes  Musculoskeletal: Strength & Muscle Tone: within normal limits Gait & Station: normal Patient leans: N/A  Psychiatric Specialty Exam: Review of Systems  All other systems reviewed and are negative.   Blood pressure 127/78, pulse 99, temperature 98 F (36.7 C), temperature source Oral, resp. rate 18, height 5' 5.5" (1.664 m), weight 77.6 kg, SpO2 100 %.Body mass index is 28.02 kg/m.  General Appearance: Casual  Eye Contact::  Fair  Speech:  Normal Rate409  Volume:  Normal  Mood:  Anxious  Affect:  Congruent  Thought Process:  Coherent and Descriptions of Associations: Circumstantial  Orientation:  Full (Time, Place, and Person)  Thought Content:  Rumination  Suicidal Thoughts:  No  Homicidal Thoughts:  No  Memory:  Immediate;   Fair Recent;   Fair Remote;   Fair  Judgement:  Intact  Insight:  Lacking  Psychomotor Activity:  Increased  Concentration:  Fair  Recall:  [employer] of Knowledge:Fair  Language: Good  Akathisia:  Negative  Handed:  Right  AIMS (if indicated):     Assets:  Desire for Improvement Resilience Social Support  Sleep:  Number of Hours: 6.75  Cognition: WNL  ADL's:  Intact   Mental Status Per Nursing Assessment::   On Admission:  Suicidal ideation indicated by patient  Demographic Factors:  Divorced or widowed, Caucasian, Low socioeconomic status, Living alone and Unemployed  Loss  Factors: NA  Historical Factors: Impulsivity  Risk Reduction Factors:   Positive social support  Continued Clinical Symptoms:  Severe Anxiety and/or Agitation Depression:   Impulsivity Personality Disorders:   Cluster B  Cognitive Features That Contribute To Risk:  None    Suicide Risk:  Minimal: No identifiable suicidal ideation.  Patients presenting with no risk factors but with morbid ruminations; may be classified as minimal risk based on the severity of the depressive symptoms  Follow-up Information    Chucky May, MD. Go on 10/01/2019.   Specialty: Psychiatry Why: Your hospital follow up appointment for medication management is 10/01/2019 at 11:00am with Dr. Toy Care. Your appointment will be held virtually. Please call agency on day of discharge to arange virtual visit.  Contact information: [address] [place] [address] [phone]        Center, [place]. Call.   Specialty: Behavioral Health Why: Referral made on 09/26/19. Per receptionist, once patient's referral is reviewed they will contact the patient with their appointment time and date to establish services for medication management and therapy services.  Contact information: [place] [address] [phone]        Care, [facility]. Call.   Specialty: Home Health Services Why: If interested, please contact agency regarding home health aid services. CSW spoke with your sister regarding payer options.  Contact information: [place] [address] [phone]        Clapacs, Madie Reno, MD. Call.   Specialty: Psychiatry Why: If you are interested in ECT  services, please contact office to establish services.  Contact information: [place] [address] [phone]           Plan Of Care/Follow-up recommendations:  Activity:  ad lib  Sharma Covert, MD 09/27/2019, 7:54 AM

## 2019-09-27 NOTE — Discharge Summary (Addendum)
Physician Discharge Summary Note  Patient:  Jeanette Rose is an 63 y.o., female MRN:  [id number] DOB:  07-Jan-1956 Patient phone:  [phone] (home)  Patient address:   [place] [address],  Total Time spent with patient: 30 minutes  Date of Admission:  09/11/2019 Date of Discharge: 09/27/19  Reason for Admission:  63 year old female with a past psychiatric history significant for major depression, generalized anxiety disorder, posttraumatic stress disorder and components of borderline personality disorder who presented to the [facility] emergency department on 09/10/2019 with suicidal ideation.   Principal Problem: MDD (major depressive disorder), recurrent severe, without psychosis ([place]) Discharge Diagnoses: Principal Problem:   MDD (major depressive disorder), recurrent severe, without psychosis ([place]) Active Problems:   Type 2 diabetes mellitus with diabetic polyneuropathy, with long-term current use of insulin (HCC)   Generalized anxiety disorder   Benzodiazepine dependence, continuous (HCC)   Persistent depressive disorder with anxious distress, currently severe   Past Psychiatric History: Patient have had previous psychiatric admissions here at [facility].  She has had depression for many years.  She has had posttraumatic stress disorder for 9 years since finding her son dead in 12/07/09.  She is followed by Dr. Toy Care locally.  She has been on multiple medications without great success including ECT.  Past Medical History:  Past Medical History:  Diagnosis Date  . Acute acalculous cholecystitis s/p lap chole 04/03/2014 04/02/2014  . Allergy   . Anemia   . Anxiety   . Asthma   . Depression   . Fatty liver    noted on CT per [facility] records  . Fibromyalgia   . GERD (gastroesophageal reflux disease)   . Hyperlipidemia associated with type 2 diabetes mellitus ([place])   . Hypertension   . Hypothyroid December 07, 2013   Transiently in 12-07-2013. Synthroid 20mg was  stopped 05/07/2016 w/ tsh nml following.  . Obesity (BMI 30-39.9)   . OSA (obstructive sleep apnea)    non-compliant with CPAP  . Seborrheic dermatitis    [facility] face and [facility]  . Stroke ([facility]    TIA  . Type 2 diabetes mellitus ([place]     Past Surgical History:  Procedure Laterality Date  . BREAST REDUCTION SURGERY  06/2008  . CHOLECYSTECTOMY N/A 04/03/2014   Procedure: LAPAROSCOPIC CHOLECYSTECTOMY ;  Surgeon: SAdin Hector MD;  Location: [facility] ORS;  Service: General;  Laterality: N/A;  . REDUCTION MAMMAPLASTY    . TUBAL LIGATION     Family History:  Family History  Problem Relation Age of Onset  . Atrial fibrillation Mother   . Heart disease Father        CAD  . Diabetes Brother   . Cancer Maternal Grandmother   . Diabetes Paternal Grandmother   . Fibromyalgia Sister   . Colitis Sister    Family Psychiatric  History: Denies Social History:  Social History   Substance and Sexual Activity  Alcohol Use No     Social History   Substance and Sexual Activity  Drug Use No    Social History   Socioeconomic History  . Marital status: Divorced    Spouse name: Not on file  . Number of children: Not on file  . Years of education: Not on file  . Highest education level: Not on file  Occupational History  . Not on file  Tobacco Use  . Smoking status: Former Smoker    Packs/day: 4.00    Years: 15.00  Pack years: 60.00    Types: Cigarettes    Quit date: 04/22/2004    Years since quitting: 15.4  . Smokeless tobacco: Never Used  . Tobacco comment: 4 pack year hx  Substance and Sexual Activity  . Alcohol use: No  . Drug use: No  . Sexual activity: Never  Other Topics Concern  . Not on file  Social History Narrative  . Not on file   Social Determinants of Health   Financial Resource Strain:   . Difficulty of Paying Living Expenses: Not on file  Food Insecurity:   . Worried About Charity fundraiser in the Last Year: Not on file  . Ran  Out of Food in the Last Year: Not on file  Transportation Needs:   . Lack of Transportation (Medical): Not on file  . Lack of Transportation (Non-Medical): Not on file  Physical Activity:   . Days of Exercise per Week: Not on file  . Minutes of Exercise per Session: Not on file  Stress:   . Feeling of Stress : Not on file  Social Connections:   . Frequency of Communication with Friends and Family: Not on file  . Frequency of Social Gatherings with Friends and Family: Not on file  . Attends Religious Services: Not on file  . Active Member of Clubs or Organizations: Not on file  . Attends Archivist Meetings: Not on file  . Marital Status: Not on file    Hospital Course:  Patient remained on the [facility] unit for 15 days. The patient stabilized on medication and therapy. Patient was discharged on Luvox 100 mg p.o. nightly, iloperidone 2 mg p.o. twice daily, Lantus 28 units every morning, Avapro 300 mg p.o. daily, metformin 24-hour tablet 1000 mg p.o. daily, Protonix 40 mg p.o. twice daily, Colace 100 mg p.o. twice daily, Lipitor 80 mg p.o.  Nightly, and aspirin EC 81 mg p.o. daily. Patient has shown improvement with improved mood, affect, sleep, appetite, and interaction. Patient has attended group and participated. Patient has been seen in the day room interacting with peers and staff appropriately. Patient denies any SI/HI/AVH and contracts for safety. Patient has reported reluctance about discharging, but her sister has arranged new housing and is assisting the patient in her new housing arrangements. Patient has shown improvement and denies any suicidal ideations and has stopped crying as she was upon admission. Patient agrees to follow up at Dr. Chucky May, Dr. Weber Cooks for ECT, [facility] home health care, and [facility]. Patient is provided with prescriptions for their medications upon discharge.  Physical Findings: AIMS: Facial and Oral Movements Muscles of  Facial Expression: None, normal Lips and Perioral Area: None, normal Jaw: None, normal Tongue: None, normal,Extremity Movements Upper (arms, wrists, hands, fingers): None, normal Lower (legs, knees, ankles, toes): None, normal, Trunk Movements Neck, shoulders, hips: None, normal, Overall Severity Severity of abnormal movements (highest score from questions above): None, normal Incapacitation due to abnormal movements: None, normal Patient's awareness of abnormal movements (rate only patient's report): No Awareness, Dental Status Current problems with teeth and/or dentures?: No Does patient usually wear dentures?: No  CIWA:  CIWA-Ar Total: 1 COWS:  COWS Total Score: 3  Musculoskeletal: Strength & Muscle Tone: within normal limits Gait & Station: normal Patient leans: N/A  Psychiatric Specialty Exam: Physical Exam  Nursing note and vitals reviewed. Constitutional: She is oriented to person, place, and time. She appears well-developed and well-nourished.  Cardiovascular: Normal rate.  Respiratory: Effort  normal.  Musculoskeletal:        General: Normal range of motion.  Neurological: She is alert and oriented to person, place, and time.  Skin: Skin is warm.    Review of Systems  Constitutional: Negative.   HENT: Negative.   Eyes: Negative.   Respiratory: Negative.   Cardiovascular: Negative.   Gastrointestinal: Negative.   Genitourinary: Negative.   Musculoskeletal: Negative.   Skin: Negative.   Neurological: Negative.     Blood pressure 127/78, pulse 99, temperature 98 F (36.7 C), temperature source Oral, resp. rate 18, height 5' 5.5" (1.664 m), weight 77.6 kg, SpO2 100 %.Body mass index is 28.02 kg/m.   General Appearance: Casual  Eye Contact::  Fair  Speech:  Normal Rate409  Volume:  Normal  Mood:  Anxious  Affect:  Congruent  Thought Process:  Coherent and Descriptions of Associations: Circumstantial  Orientation:  Full (Time, Place, and Person)  Thought  Content:  Rumination  Suicidal Thoughts:  No  Homicidal Thoughts:  No  Memory:  Immediate;   Fair Recent;   Fair Remote;   Fair  Judgement:  Intact  Insight:  Lacking  Psychomotor Activity:  Increased  Concentration:  Fair  Recall:  [employer] of Knowledge:Fair  Language: Good  Akathisia:  Negative  Handed:  Right  AIMS (if indicated):     Assets:  Desire for Improvement Resilience Social Support  Sleep:  Number of Hours: 6.75  Cognition: WNL  ADL's:  Intact    Have you used any form of tobacco in the last 30 days? (Cigarettes, Smokeless Tobacco, Cigars, and/or Pipes): No  Has this patient used any form of tobacco in the last 30 days? (Cigarettes, Smokeless Tobacco, Cigars, and/or Pipes)  No  Blood Alcohol level:  Lab Results  Component Value Date   ETH <10 09/10/2019   ETH <10 27/74/1287    Metabolic Disorder Labs:  Lab Results  Component Value Date   HGBA1C 9.9 (H) 09/12/2019   MPG 237.43 09/12/2019   MPG 134.11 03/09/2019   No results found for: PROLACTIN Lab Results  Component Value Date   CHOL 174 10/09/2018   TRIG 210 (H) 10/09/2018   HDL 44 10/09/2018   CHOLHDL 4.0 10/09/2018   [place] 88 10/09/2018   LDLCALC 77 06/28/2018    See Psychiatric Specialty Exam and Suicide Risk Assessment completed by Attending Physician prior to discharge.  Discharge destination:  Home  Is patient on multiple antipsychotic therapies at discharge:  No   Has Patient had three or more failed trials of antipsychotic monotherapy by history:  No  Recommended Plan for Multiple Antipsychotic Therapies: NA  Discharge Instructions    Discharge instructions   Complete by: As directed    Patient is instructed to take all prescribed medications as recommended. Report any side effects or adverse reactions to your outpatient psychiatrist. Patient is instructed to abstain from alcohol and illegal drugs while on prescription medications. In the event of worsening symptoms,  patient is instructed to call the crisis hotline, 911, or go to the nearest emergency department for evaluation and treatment.     Allergies as of 09/27/2019      Reactions   Farxiga [dapagliflozin] Anxiety, Other (See Comments)   Made depression and anxiety worse   Rexulti [brexpiprazole] Anxiety, Other (See Comments)   Made anxiety worse   Trintellix [vortioxetine] Anxiety, Other (See Comments)   Made anxiety worse      Medication List    STOP taking these medications  amantadine 100 MG capsule Commonly known as: SYMMETREL   ARIPiprazole 2 MG tablet Commonly known as: ABILIFY   mirtazapine 15 MG tablet Commonly known as: REMERON   olmesartan 20 MG tablet Commonly known as: BENICAR   omeprazole 40 MG capsule Commonly known as: PRILOSEC   Toujeo Max SoloStar 300 UNIT/ML Sopn Generic drug: Insulin Glargine (2 Unit Dial) Replaced by: insulin glargine 100 UNIT/ML injection     TAKE these medications     Indication  aspirin 81 MG EC tablet Take 1 tablet (81 mg total) by mouth daily. Swallow whole. What changed: additional instructions  Indication: Stable Angina Pectoris   atorvastatin 80 MG tablet Commonly known as: LIPITOR Take 1 tablet (80 mg total) by mouth at bedtime. What changed: when to take this  Indication: High Amount of Fats in the Blood   diazepam 5 MG tablet Commonly known as: VALIUM Take 5-10 mg by mouth 3 (three) times daily as needed for anxiety.  Indication: Feeling Anxious   docusate sodium 100 MG capsule Commonly known as: COLACE Take 1 capsule (100 mg total) by mouth 2 (two) times daily.  Indication: Constipation   fluvoxaMINE 100 MG tablet Commonly known as: LUVOX Take 1 tablet (100 mg total) by mouth at bedtime.  Indication: Major Depressive Disorder   Iloperidone 2 MG Tabs Take 1 tablet (2 mg total) by mouth 2 (two) times daily.  Indication: Mood   insulin glargine 100 UNIT/ML injection Commonly known as: LANTUS Inject 0.28 mLs  (28 Units total) into the skin every morning. Replaces: Toujeo Max SoloStar 300 UNIT/ML Sopn  Indication: Type 2 Diabetes   irbesartan 300 MG tablet Commonly known as: AVAPRO Take 1 tablet (300 mg total) by mouth daily.  Indication: High Blood Pressure Disorder   metformin 1000 MG (OSM) 24 hr tablet Commonly known as: FORTAMET Take 1 tablet (1,000 mg total) by mouth daily with breakfast. What changed:   medication strength  when to take this  Indication: Type 2 Diabetes   pantoprazole 40 MG tablet Commonly known as: PROTONIX Take 1 tablet (40 mg total) by mouth 2 (two) times daily.  Indication: Gastroesophageal Reflux Disease     ASK your doctor about these medications     Indication  cephALEXin 500 MG capsule Commonly known as: KEFLEX Take 1 capsule (500 mg total) by mouth 2 (two) times daily for 7 days. Ask about: Should I take this medication?       Follow-up Information    Chucky May, MD. Go on 10/01/2019.   Specialty: Psychiatry Why: Your hospital follow up appointment for medication management is 10/01/2019 at 11:00am with Dr. Toy Care. Your appointment will be held virtually. Please call agency on day of discharge to arange virtual visit.  Contact information: [place] [address] [place] [address] [phone]        Center, [place]. Call.   Specialty: Behavioral Health Why: Referral made on 09/26/19. Per receptionist, once patient's referral is reviewed they will contact the patient with their appointment time and date to establish services for medication management and therapy services.  Contact information: [place] [address] [phone]        Care, [facility]. Call.   Specialty: Home Health Services Why: If interested, please contact agency regarding home health aid services. CSW spoke with your sister regarding payer options.  Contact information: [place] [address] [phone]  Clapacs, Madie Reno, MD. Call.   Specialty: Psychiatry Why: If you are interested in ECT services, please contact office to establish services.  Contact information: [address] [place] [address] [phone]           Follow-up recommendations:  Continue activity as tolerated. Continue diet as recommended by your PCP. Ensure to keep all appointments with outpatient providers.  Comments:  Patient is instructed prior to discharge to: Take all medications as prescribed by his/her mental healthcare provider. Report any adverse effects and or reactions from the medicines to his/her outpatient provider promptly. Patient has been instructed & cautioned: To not engage in alcohol and or illegal drug use while on prescription medicines. In the event of worsening symptoms, patient is instructed to call the crisis hotline, 911 and or go to the nearest ED for appropriate evaluation and treatment of symptoms. To follow-up with his/her primary care provider for your other medical issues, concerns and or health care needs.    Signed: Lowry Ram Mikle Sternberg, FNP 09/27/2019, 8:28 AM

## 2019-09-29 ENCOUNTER — Other Ambulatory Visit (HOSPITAL_COMMUNITY): Payer: Self-pay | Admitting: Psychiatry

## 2019-09-29 MED ORDER — BUSPIRONE HCL 30 MG PO TABS: 30.0000 mg | ORAL_TABLET | Freq: Two times a day (BID) | ORAL | 0 refills | Status: DC

## 2019-09-29 NOTE — Progress Notes (Signed)
Received message from nursing that the patient's sister had called about the [place].  Apparently the cost of the medication was going to be several $100, and the patient nor sister could afford this.  I reviewed the electronic medical record to see what Dr. Eston Esters thinking was when he started the [place].  Apparently he had asked for a consultation from Dr. Jake Samples for something that would augment her anxiety treatment.  He recommended Fanapt.  Given that she had been on this for anxiety, and the fact that the recommendation for treatment was for anxiety I felt as though a more specific anxiety drug was indicated.  The patient has been on buspirone in the past and tolerated it fairly well.  We will give her 30 mg twice a day.  She had previously been on somewhere between 15 to 20 mg p.o. 3 times daily.  The patient is scheduled to follow-up with her primary psychiatrist in 7 to 10 days.  I sent the prescription to the [facility] on [facility] in [place].

## 2019-10-04 ENCOUNTER — Other Ambulatory Visit: Payer: Self-pay

## 2019-10-04 ENCOUNTER — Emergency Department (HOSPITAL_COMMUNITY)
Admission: EM | Admit: 2019-10-04 | Discharge: 2019-10-08 | Disposition: A | Discharge status: Home / Self Care | Payer: Medicare Other | Attending: Emergency Medicine | Admitting: Emergency Medicine

## 2019-10-04 ENCOUNTER — Encounter (HOSPITAL_COMMUNITY): Payer: Self-pay

## 2019-10-04 DIAGNOSIS — Z8673 Personal history of transient ischemic attack (TIA), and cerebral infarction without residual deficits: Secondary | ICD-10-CM | POA: Diagnosis not present

## 2019-10-04 DIAGNOSIS — Z87891 Personal history of nicotine dependence: Secondary | ICD-10-CM | POA: Diagnosis not present

## 2019-10-04 DIAGNOSIS — R202 Paresthesia of skin: Secondary | ICD-10-CM | POA: Diagnosis not present

## 2019-10-04 DIAGNOSIS — F331 Major depressive disorder, recurrent, moderate: Secondary | ICD-10-CM | POA: Diagnosis present

## 2019-10-04 DIAGNOSIS — F332 Major depressive disorder, recurrent severe without psychotic features: Secondary | ICD-10-CM | POA: Diagnosis not present

## 2019-10-04 DIAGNOSIS — Z794 Long term (current) use of insulin: Secondary | ICD-10-CM | POA: Insufficient documentation

## 2019-10-04 DIAGNOSIS — F1320 Sedative, hypnotic or anxiolytic dependence, uncomplicated: Secondary | ICD-10-CM | POA: Diagnosis present

## 2019-10-04 DIAGNOSIS — Z20822 Contact with and (suspected) exposure to covid-19: Secondary | ICD-10-CM | POA: Diagnosis not present

## 2019-10-04 DIAGNOSIS — F419 Anxiety disorder, unspecified: Secondary | ICD-10-CM

## 2019-10-04 DIAGNOSIS — E119 Type 2 diabetes mellitus without complications: Secondary | ICD-10-CM | POA: Diagnosis not present

## 2019-10-04 DIAGNOSIS — R531 Weakness: Secondary | ICD-10-CM | POA: Diagnosis not present

## 2019-10-04 DIAGNOSIS — Z7982 Long term (current) use of aspirin: Secondary | ICD-10-CM | POA: Diagnosis not present

## 2019-10-04 DIAGNOSIS — N390 Urinary tract infection, site not specified: Secondary | ICD-10-CM | POA: Diagnosis not present

## 2019-10-04 DIAGNOSIS — Z79899 Other long term (current) drug therapy: Secondary | ICD-10-CM | POA: Diagnosis not present

## 2019-10-04 DIAGNOSIS — F411 Generalized anxiety disorder: Secondary | ICD-10-CM | POA: Diagnosis not present

## 2019-10-04 DIAGNOSIS — I10 Essential (primary) hypertension: Secondary | ICD-10-CM | POA: Insufficient documentation

## 2019-10-04 DIAGNOSIS — E039 Hypothyroidism, unspecified: Secondary | ICD-10-CM | POA: Diagnosis not present

## 2019-10-04 DIAGNOSIS — F339 Major depressive disorder, recurrent, unspecified: Secondary | ICD-10-CM | POA: Diagnosis not present

## 2019-10-04 DIAGNOSIS — F4322 Adjustment disorder with anxiety: Secondary | ICD-10-CM | POA: Diagnosis not present

## 2019-10-04 DIAGNOSIS — J45909 Unspecified asthma, uncomplicated: Secondary | ICD-10-CM | POA: Diagnosis not present

## 2019-10-04 DIAGNOSIS — Z03818 Encounter for observation for suspected exposure to other biological agents ruled out: Secondary | ICD-10-CM | POA: Diagnosis not present

## 2019-10-04 DIAGNOSIS — R0902 Hypoxemia: Secondary | ICD-10-CM | POA: Diagnosis not present

## 2019-10-04 LAB — CBC WITH DIFFERENTIAL/PLATELET
Abs Immature Granulocytes: 0.03 10*3/uL (ref 0.00–0.07)
Basophils Absolute: 0.1 10*3/uL (ref 0.0–0.1)
Basophils Relative: 1 %
Eosinophils Absolute: 0.2 10*3/uL (ref 0.0–0.5)
Eosinophils Relative: 2 %
HCT: 44.8 % (ref 36.0–46.0)
Hemoglobin: 14.6 g/dL (ref 12.0–15.0)
Immature Granulocytes: 0 %
Lymphocytes Relative: 26 %
Lymphs Abs: 2.6 10*3/uL (ref 0.7–4.0)
MCH: 29.7 pg (ref 26.0–34.0)
MCHC: 32.6 g/dL (ref 30.0–36.0)
MCV: 91.1 fL (ref 80.0–100.0)
Monocytes Absolute: 0.7 10*3/uL (ref 0.1–1.0)
Monocytes Relative: 7 %
Neutro Abs: 6.7 10*3/uL (ref 1.7–7.7)
Neutrophils Relative %: 64 %
Platelets: 317 10*3/uL (ref 150–400)
RBC: 4.92 MIL/uL (ref 3.87–5.11)
RDW: 15.5 % (ref 11.5–15.5)
WBC: 10.3 10*3/uL (ref 4.0–10.5)
nRBC: 0 % (ref 0.0–0.2)

## 2019-10-04 MED ORDER — OLANZAPINE 10 MG PO TBDP
10.0000 mg | ORAL_TABLET | Freq: Once | ORAL | Status: AC
  Administered 2019-10-04: 10 mg via ORAL
  Filled 2019-10-04 (×2): qty 1

## 2019-10-04 NOTE — ED Triage Notes (Signed)
Pt states that she is too scared and anxious to do anything. States that she is too scared to eat or shower. Denies SI at this time.

## 2019-10-04 NOTE — ED Provider Notes (Signed)
[facility] DEPT Provider Note   CSN: [id number] Arrival date & time: 10/04/19  2227     History Chief Complaint  Patient presents with  . Anxiety    Jeanette Rose is a 64 y.o. female.  64 yo F with a chief complaints of debilitating anxiety.  Patient states that she has been unable to do anything for herself at home for the past couple days.  States that she has been unable to take her medicines because she is worried that she is not going to be able to swallow them.  Is happened to her before and required psychiatric evaluation.  The history is provided by the patient.  Anxiety Pertinent negatives include no chest pain, no headaches and no shortness of breath.  Illness Severity:  Moderate Onset quality:  Gradual Duration:  2 days Timing:  Constant Progression:  Worsening Chronicity:  New Associated symptoms: no chest pain, no congestion, no fever, no headaches, no myalgias, no nausea, no rhinorrhea, no shortness of breath, no vomiting and no wheezing        Past Medical History:  Diagnosis Date  . Acute acalculous cholecystitis s/p lap chole 04/03/2014 04/02/2014  . Allergy   . Anemia   . Anxiety   . Asthma   . Depression   . Fatty liver    noted on CT per [facility] records  . Fibromyalgia   . GERD (gastroesophageal reflux disease)   . Hyperlipidemia associated with type 2 diabetes mellitus ([place])   . Hypertension   . Hypothyroid 2015   Transiently in 2015. Synthroid 66mg was stopped 05/07/2016 w/ tsh nml following.  . Obesity (BMI 30-39.9)   . OSA (obstructive sleep apnea)    non-compliant with CPAP  . Seborrheic dermatitis    [facility]  . Stroke ([facility]    TIA  . Type 2 diabetes mellitus ([facility]     Patient Active Problem List   Diagnosis Date Noted  . Persistent depressive disorder with anxious distress, currently severe 09/16/2019  . MDD (major depressive disorder), recurrent severe, without  psychosis ([facility] 09/11/2019  . Major depression 03/15/2019  . Severe recurrent major depression without psychotic features ([facility] 03/15/2019  . Generalized anxiety disorder 03/04/2019  . Benzodiazepine dependence, continuous ([facility] 03/04/2019  . MDD (major depressive disorder), recurrent episode, severe ([place] 03/04/2019  . Positive colorectal cancer screening using Cologuard test 02/15/2018  . Seborrheic dermatitis   . History of hypothyroidism 10/26/2017  . Elevated ferritin level 10/26/2017  . Vitamin D deficiency 10/26/2017  . Diabetic peripheral neuropathy associated with type 2 diabetes mellitus ([facility] 10/26/2017  . OSA (obstructive sleep apnea)   . Hyperlipidemia LDL goal <100   . Major depressive disorder, recurrent episode ([place]   . Constipation 04/22/2014  . Steatohepatitis 04/03/2014  . Anxiety   . Type 2 diabetes mellitus with diabetic polyneuropathy, with long-term current use of insulin ([place]   . GERD (gastroesophageal reflux disease)   . Hypertension   . Obesity (BMI 30-39.9)     Past Surgical History:  Procedure Laterality Date  . BREAST REDUCTION SURGERY  06/2008  . CHOLECYSTECTOMY N/A 04/03/2014   Procedure: LAPAROSCOPIC CHOLECYSTECTOMY ;  Surgeon: SAdin Hector MD;  Location: [facility] ORS;  Service: General;  Laterality: N/A;  . REDUCTION MAMMAPLASTY    . TUBAL LIGATION       OB History   No obstetric history on file.     Family History  Problem Relation Age of  Onset  . Atrial fibrillation Mother   . Heart disease Father        CAD  . Diabetes Brother   . Cancer Maternal Grandmother   . Diabetes Paternal Grandmother   . Fibromyalgia Sister   . Colitis Sister     Social History   Tobacco Use  . Smoking status: Former Smoker    Packs/day: 4.00    Years: 15.00    Pack years: 60.00    Types: Cigarettes    Quit date: 04/22/2004    Years since quitting: 15.4  . Smokeless tobacco: Never Used  . Tobacco comment: 4 pack year hx  Substance Use Topics  .  Alcohol use: No  . Drug use: No    Home Medications Prior to Admission medications   Medication Sig Start Date End Date Taking? Authorizing Provider  aspirin 81 MG EC tablet Take 1 tablet (81 mg total) by mouth daily. Swallow whole. 09/27/19   Money, Lowry Ram, FNP  atorvastatin (LIPITOR) 80 MG tablet Take 1 tablet (80 mg total) by mouth at bedtime. 09/27/19   Money, Lowry Ram, FNP  busPIRone (BUSPAR) 30 MG tablet Take 1 tablet (30 mg total) by mouth 2 (two) times daily. 09/29/19 09/28/20  Sharma Covert, MD  diazepam (VALIUM) 5 MG tablet Take 5-10 mg by mouth 3 (three) times daily as needed for anxiety.    [provider]  docusate sodium (COLACE) 100 MG capsule Take 1 capsule (100 mg total) by mouth 2 (two) times daily. 09/27/19   Money, Lowry Ram, FNP  fluvoxaMINE (LUVOX) 100 MG tablet Take 1 tablet (100 mg total) by mouth at bedtime. 09/27/19   Money, Lowry Ram, FNP  Iloperidone 2 MG TABS Take 1 tablet (2 mg total) by mouth 2 (two) times daily. 09/27/19   Money, Lowry Ram, FNP  insulin glargine (LANTUS) 100 UNIT/ML injection Inject 0.28 mLs (28 Units total) into the skin every morning. 09/27/19   Money, Lowry Ram, FNP  irbesartan (AVAPRO) 300 MG tablet Take 1 tablet (300 mg total) by mouth daily. 09/27/19   Money, Lowry Ram, FNP  metFORMIN (FORTAMET) 1000 MG (OSM) 24 hr tablet Take 1 tablet (1,000 mg total) by mouth daily with breakfast. 09/27/19   Money, Lowry Ram, FNP  pantoprazole (PROTONIX) 40 MG tablet Take 1 tablet (40 mg total) by mouth 2 (two) times daily. 09/27/19   Money, Lowry Ram, FNP    Allergies    Farxiga [dapagliflozin], Rexulti [brexpiprazole], and Trintellix [vortioxetine]  Review of Systems   Review of Systems  Constitutional: Negative for chills and fever.  HENT: Negative for congestion and rhinorrhea.   Eyes: Negative for redness and visual disturbance.  Respiratory: Negative for shortness of breath and wheezing.   Cardiovascular: Negative for chest pain and  palpitations.  Gastrointestinal: Negative for nausea and vomiting.  Genitourinary: Negative for dysuria and urgency.  Musculoskeletal: Negative for arthralgias and myalgias.  Skin: Negative for pallor and wound.  Neurological: Negative for dizziness and headaches.  Psychiatric/Behavioral: Positive for agitation.    Physical Exam Updated Vital Signs BP (!) 161/95 (BP Location: Left Arm)   Pulse (!) 102   Temp 98.2 F (36.8 C) (Oral)   Resp 16   SpO2 99%   Physical Exam Vitals and nursing note reviewed.  Constitutional:      General: She is not in acute distress.    Appearance: She is well-developed. She is not diaphoretic.  HENT:     Head: Normocephalic and atraumatic.  Eyes:     Pupils: Pupils are equal, round, and reactive to light.  Cardiovascular:     Rate and Rhythm: Normal rate and regular rhythm.     Heart sounds: No murmur. No friction rub. No gallop.   Pulmonary:     Effort: Pulmonary effort is normal.     Breath sounds: No wheezing or rales.  Abdominal:     General: There is no distension.     Palpations: Abdomen is soft.     Tenderness: There is no abdominal tenderness.  Musculoskeletal:        General: No tenderness.     Cervical back: Normal range of motion and neck supple.  Skin:    General: Skin is warm and dry.  Neurological:     Mental Status: She is alert and oriented to person, place, and time.  Psychiatric:        Behavior: Behavior normal.     ED Results / Procedures / Treatments   Labs (all labs ordered are listed, but only abnormal results are displayed) Labs Reviewed  RESPIRATORY PANEL BY RT PCR (FLU A&B, COVID)  COMPREHENSIVE METABOLIC PANEL  ETHANOL  RAPID URINE DRUG SCREEN, HOSP PERFORMED  CBC WITH DIFFERENTIAL/PLATELET    EKG None  Radiology No results found.  Procedures Procedures (including critical care time)  Medications Ordered in ED Medications  OLANZapine zydis (ZYPREXA) disintegrating tablet 10 mg (10 mg Oral  Given 10/04/19 2343)    ED Course  I have reviewed the triage vital signs and the nursing notes.  Pertinent labs & imaging results that were available during my care of the patient were reviewed by me and considered in my medical decision making (see chart for details).    MDM Rules/Calculators/A&P                      64 yo F with anxiety.  Patient states it is too severe for her to even do some of her basic needs.  She is required hospitalization for this previously.  We will give a dose of olanzapine here.  Have psych evaluate.  Feel medically clear.   The patients results and plan were reviewed and discussed.   Any x-rays performed were independently reviewed by myself.   Differential diagnosis were considered with the presenting HPI.  Medications  OLANZapine zydis (ZYPREXA) disintegrating tablet 10 mg (10 mg Oral Given 10/04/19 2343)    Vitals:   10/04/19 2236  BP: (!) 161/95  Pulse: (!) 102  Resp: 16  Temp: 98.2 F (36.8 C)  TempSrc: Oral  SpO2: 99%    Final diagnoses:  Anxiety    Final Clinical Impression(s) / ED Diagnoses Final diagnoses:  Anxiety    Rx / DC Orders ED Discharge Orders    None       Deno Etienne, DO 10/05/19 0115

## 2019-10-05 LAB — RAPID URINE DRUG SCREEN, HOSP PERFORMED
Amphetamines: NOT DETECTED
Barbiturates: NOT DETECTED
Benzodiazepines: POSITIVE — AB
Cocaine: NOT DETECTED
Opiates: NOT DETECTED
Tetrahydrocannabinol: NOT DETECTED

## 2019-10-05 LAB — COMPREHENSIVE METABOLIC PANEL
ALT: 58 U/L — ABNORMAL HIGH (ref 0–44)
AST: 37 U/L (ref 15–41)
Albumin: 4.4 g/dL (ref 3.5–5.0)
Alkaline Phosphatase: 94 U/L (ref 38–126)
Anion gap: 13 (ref 5–15)
BUN: 26 mg/dL — ABNORMAL HIGH (ref 8–23)
CO2: 23 mmol/L (ref 22–32)
Calcium: 10 mg/dL (ref 8.9–10.3)
Chloride: 102 mmol/L (ref 98–111)
Creatinine, Ser: 0.68 mg/dL (ref 0.44–1.00)
GFR calc Af Amer: >60 mL/min (ref >60)
GFR calc non Af Amer: >60 mL/min (ref >60)
Glucose, Bld: 161 mg/dL — ABNORMAL HIGH (ref 70–99)
Potassium: 3.2 mmol/L — ABNORMAL LOW (ref 3.5–5.1)
Sodium: 138 mmol/L (ref 135–145)
Total Bilirubin: 1.8 mg/dL — ABNORMAL HIGH (ref 0.3–1.2)
Total Protein: 7.9 g/dL (ref 6.5–8.1)

## 2019-10-05 LAB — URINALYSIS, ROUTINE W REFLEX MICROSCOPIC
Bilirubin Urine: NEGATIVE
Glucose, UA: 50 mg/dL — AB
Hgb urine dipstick: NEGATIVE
Ketones, ur: NEGATIVE mg/dL
Leukocytes,Ua: NEGATIVE
Nitrite: NEGATIVE
Protein, ur: NEGATIVE mg/dL
Specific Gravity, Urine: 1.021 (ref 1.005–1.030)
pH: 5 (ref 5.0–8.0)

## 2019-10-05 LAB — RESPIRATORY PANEL BY RT PCR (FLU A&B, COVID)
Influenza A by PCR: NEGATIVE
Influenza B by PCR: NEGATIVE
SARS Coronavirus 2 by RT PCR: NEGATIVE

## 2019-10-05 LAB — ETHANOL: Alcohol, Ethyl (B): <10 mg/dL (ref <10)

## 2019-10-05 MED ORDER — INSULIN GLARGINE 100 UNIT/ML ~~LOC~~ SOLN
28.0000 [IU] | Freq: Every morning | SUBCUTANEOUS | Status: DC
  Administered 2019-10-05 – 2019-10-08 (×3): 28 [IU] via SUBCUTANEOUS
  Filled 2019-10-05 (×4): qty 0.28

## 2019-10-05 MED ORDER — METFORMIN HCL ER 500 MG PO TB24
1000.0000 mg | ORAL_TABLET | Freq: Every day | ORAL | Status: DC
  Administered 2019-10-05 – 2019-10-08 (×3): 1000 mg via ORAL
  Filled 2019-10-05 (×5): qty 2

## 2019-10-05 MED ORDER — PANTOPRAZOLE SODIUM 40 MG PO TBEC
40.0000 mg | DELAYED_RELEASE_TABLET | Freq: Two times a day (BID) | ORAL | Status: DC
  Administered 2019-10-05 – 2019-10-08 (×5): 40 mg via ORAL
  Filled 2019-10-05 (×5): qty 1

## 2019-10-05 MED ORDER — DIAZEPAM 5 MG PO TABS
5.0000 mg | ORAL_TABLET | Freq: Three times a day (TID) | ORAL | Status: DC | PRN
  Administered 2019-10-06: 03:00:00 5 mg via ORAL
  Filled 2019-10-05: qty 1

## 2019-10-05 MED ORDER — ILOPERIDONE 2 MG PO TABS
2.0000 mg | ORAL_TABLET | Freq: Two times a day (BID) | ORAL | Status: DC
  Administered 2019-10-05 – 2019-10-08 (×3): 2 mg via ORAL
  Filled 2019-10-05 (×9): qty 1

## 2019-10-05 MED ORDER — ASPIRIN EC 81 MG PO TBEC
81.0000 mg | DELAYED_RELEASE_TABLET | Freq: Every day | ORAL | Status: DC
  Administered 2019-10-05 – 2019-10-08 (×3): 81 mg via ORAL
  Filled 2019-10-05 (×5): qty 1

## 2019-10-05 MED ORDER — BUSPIRONE HCL 10 MG PO TABS
30.0000 mg | ORAL_TABLET | Freq: Two times a day (BID) | ORAL | Status: DC
  Administered 2019-10-05 – 2019-10-08 (×5): 30 mg via ORAL
  Filled 2019-10-05 (×5): qty 3

## 2019-10-05 MED ORDER — DOCUSATE SODIUM 100 MG PO CAPS
100.0000 mg | ORAL_CAPSULE | Freq: Two times a day (BID) | ORAL | Status: DC
  Administered 2019-10-05 – 2019-10-06 (×3): 100 mg via ORAL
  Filled 2019-10-05 (×5): qty 1

## 2019-10-05 MED ORDER — FLUVOXAMINE MALEATE 50 MG PO TABS
100.0000 mg | ORAL_TABLET | Freq: Every day | ORAL | Status: DC
  Administered 2019-10-06: 05:00:00 100 mg via ORAL
  Filled 2019-10-05 (×5): qty 2

## 2019-10-05 MED ORDER — IRBESARTAN 300 MG PO TABS
300.0000 mg | ORAL_TABLET | Freq: Every day | ORAL | Status: DC
  Administered 2019-10-05 – 2019-10-08 (×3): 300 mg via ORAL
  Filled 2019-10-05 (×4): qty 1

## 2019-10-05 MED ORDER — ATORVASTATIN CALCIUM 80 MG PO TABS
80.0000 mg | ORAL_TABLET | Freq: Every day | ORAL | Status: DC
  Filled 2019-10-05 (×3): qty 1

## 2019-10-05 NOTE — ED Notes (Signed)
Called PT office and they will page a therapist to come see this patient.

## 2019-10-05 NOTE — ED Notes (Signed)
Therapy at bedside to do evaluation.

## 2019-10-05 NOTE — ED Notes (Signed)
Pt calm and cooperative at this time, denies SI/HI. Pt c/o lower back pain, foot numbness.

## 2019-10-05 NOTE — BHH Counselor (Signed)
Clinician called RN to discuss pt's disposition however no answer.  Pt's sister Jeanette Rose, [phone]) wanted an update on pt's disposition. Clinician expressed to the sister the pt's has to consent before information is given. Pt's sister understood.  Clinician was going to discuss consent with RN and possibly giving the pt's sister an update (if consent is given).    Vertell Novak, MS, [facility], [facility] Triage Specialist [phone].

## 2019-10-05 NOTE — TOC Initial Note (Signed)
Transition of Care [facility]) - Initial/Assessment Note    Patient Details  Name: Jeanette Rose MRN: [id number] Date of Birth: 1956/07/23  [facility] of [facility]) CM/SW Contact:    Janace Hoard, LCSW Phone Number: 10/05/2019, 11:10 AM  Clinical Narrative:                 CSW spoke with patient via bedside. Patient reports she is not feeling well and is "scared". When asked what is she afraid of patient stated "I don't know. I can't remember. I can't remember anything I am supposed to tell you." Patient gave verbal permission for CSW to contact her sister.  CSW spoke with patient's sister Helene Kelp who reports patient has a long hx of anxiety, but started to decline after she was discharged from [facility] on New Years Day. Helene Kelp reports since then patient has not been able to care for herself and has complained of not being able to walk, eat, take her medications, pick up the phone, etc. CSW spoke with Helene Kelp about her options for placement. CSW explained that patient is not eligible for long-term care unless they can assist her in getting a Medicaid application done or pay out of pocket for services (ie. ALF, long-term SNF). Helene Kelp reports patient may not qualify for Medicaid due to her income. CSW explained the spend down process. CSW stated patient may be eligible for SNF, but this is only short-term and in the mean time the family should begin coming up with a plan for patient to be placed somewhere long term or 24 hour care.   CSW made EDP aware and requested a PT consult.   Expected Discharge Plan: Skilled Nursing Facility Barriers to Discharge: No Barriers Identified   Patient Goals and CMS Choice Patient states their goals for this hospitalization and ongoing recovery are:: To get better      Expected Discharge Plan and Services Expected Discharge Plan: [place] In-house Referral: Clinical Social Work Discharge Planning Services: CM Consult   Living arrangements for the  past 2 months: [place]                                      Prior Living Arrangements/Services Living arrangements for the past 2 months: Single Family Home Lives with:: Self Patient language and need for interpreter reviewed:: Yes Do you feel safe going back to the place where you live?: Yes      Need for Family Participation in Patient Care: Yes (Comment) Care giver support system in place?: Yes (comment)   Criminal Activity/Legal Involvement Pertinent to Current Situation/Hospitalization: No - Comment as needed  Activities of Daily Living Home Assistive Devices/Equipment: None ADL Screening (condition at time of admission) Patient's cognitive ability adequate to safely complete daily activities?: Yes Is the patient deaf or have difficulty hearing?: No Does the patient have difficulty seeing, even when wearing glasses/contacts?: No Does the patient have difficulty concentrating, remembering, or making decisions?: Yes Patient able to express need for assistance with ADLs?: Yes Does the patient have difficulty dressing or bathing?: No Independently performs ADLs?: Yes (appropriate for developmental age) Does the patient have difficulty walking or climbing stairs?: No Weakness of Legs: None Weakness of Arms/Hands: None  Permission Sought/Granted Permission sought to share information with : Facility Art therapist granted to share information with : Yes, Verbal Permission Granted  Share Information with NAME: [name]  Permission  granted to share info w AGENCY: SNFs  Permission granted to share info w Relationship: sister  Permission granted to share info w Contact Information: [phone]  Emotional Assessment Appearance:: Appears older than stated age Attitude/Demeanor/Rapport: Lethargic, Crying Affect (typically observed): Anxious, Tearful/Crying Orientation: : Oriented to Self, Oriented to Place, Oriented to  Time, Oriented to  Situation Alcohol / Substance Use: Not Applicable Psych Involvement: No (comment)  Admission diagnosis:  Anxiety  Patient Active Problem List   Diagnosis Date Noted  . Persistent depressive disorder with anxious distress, currently severe 09/16/2019  . MDD (major depressive disorder), recurrent severe, without psychosis ([place]) 09/11/2019  . Major depression 03/15/2019  . Severe recurrent major depression without psychotic features ([place]) 03/15/2019  . Generalized anxiety disorder 03/04/2019  . Benzodiazepine dependence, continuous ([place]) 03/04/2019  . MDD (major depressive disorder), recurrent episode, severe ([place]) 03/04/2019  . Positive colorectal cancer screening using Cologuard test 02/15/2018  . Seborrheic dermatitis   . History of hypothyroidism 10/26/2017  . Elevated ferritin level 10/26/2017  . Vitamin D deficiency 10/26/2017  . Diabetic peripheral neuropathy associated with type 2 diabetes mellitus ([place]) 10/26/2017  . OSA (obstructive sleep apnea)   . Hyperlipidemia LDL goal <100   . Major depressive disorder, recurrent episode ([place])   . Constipation 04/22/2014  . Steatohepatitis 04/03/2014  . Anxiety   . Type 2 diabetes mellitus with diabetic polyneuropathy, with long-term current use of insulin ([place])   . GERD (gastroesophageal reflux disease)   . Hypertension   . Obesity (BMI 30-39.9)    PCP:  Salvatore Marvel, PA-C Pharmacy:   [id number] [place], [place] [address]Phone: [phone]: [phone] [place] [address] [place]Phone: [phone]: [phone] [place] [address] [address]Phone: [phone]: [phone]    Social Determinants of Health (SDOH) Interventions    Readmission Risk Interventions No flowsheet  data found.

## 2019-10-05 NOTE — Discharge Instructions (Signed)
Please take the antibiotic as prescribed until gone. Keep your wound clean and covered. Soak/flush your wound with warm water, multiple times per day. You can take tylenol as needed for pain. Return to the ER for fever, large redness around the wound, pus draining from the wound, or new or worsening symptoms.  For your behavioral health needs, you are advised to follow up with an outpatient psychiatrist.  Contact one of the following providers at your earliest opportunity to ask about scheduling an intake appointment:       [facility] [place]. [address] [place], [place] [address]      [phone]       [facility]      [address] [place], [place] [address]      [phone]       Leanord Hawking, MD      [facility]      [address] [place], [place] [address]      [phone]

## 2019-10-05 NOTE — Progress Notes (Addendum)
CSW called Ebony Hail at [facility] who stated pt was denied due to being "a psyche pt who is more appropriate for psychiatric placement due to her anxiety".  CSW will continue to follow for D/C needs.  Alphonse Guild. Jizel Cheeks, LCSW, LCAS, CSI Transitions of Care Clinical Social Worker Care Coordination Department Ph: [phone]

## 2019-10-05 NOTE — ED Notes (Signed)
Patient could not walk to the bathroom.   I put patient on the bedpan and she would not use that either and patient stated that if I take her off the bedpan she would use it on herself so I put patient on a brief.

## 2019-10-05 NOTE — ED Notes (Signed)
Patient refused to eat she stated that her teeth hurt then she stated that she cant brush her teeth.

## 2019-10-05 NOTE — ED Notes (Signed)
I encouraged patient to eat.  She kept saying she couldn't and making excuses.  I cut up her meat loaf and fixed her potato and she ate very well by herself   She was able to hold the fork just fine and also hold a cup of lemonade to drink through straw.

## 2019-10-05 NOTE — BH Assessment (Signed)
[facility] Assessment Progress Note  Per EDP Madalyn Rob, MD, this pt does not require psychiatric hospitalization at this time.  Discharge instructions include referral information for several area psychiatry providers.  Pt's nurse has been notified.  [name], [place] Triage Specialist [phone]

## 2019-10-05 NOTE — ED Notes (Signed)
Called PT office and [place],PT is on the way down now.

## 2019-10-05 NOTE — ED Notes (Signed)
600 ml output from in and out cath.

## 2019-10-05 NOTE — ED Notes (Signed)
I brushed patient teeth she complained of tooth pain.  Patient stated she had a hole in one of her teeth.  I made the nurse aware.

## 2019-10-05 NOTE — Evaluation (Signed)
Physical Therapy Evaluation Patient Details Name: Jeanette Rose MRN: [id number] DOB: 04-06-1956 Today's Date: 10/05/2019   History of Present Illness  Pt admitted to ED with debilitating anxiety and inability to self care  Clinical Impression  Pt admitted as above and presenting with generalized weakness, balance deficits, c/o multi-focal pain, and anxiety limiting functional mobility.  Pt would benefit from follow up rehab at SNF level to maximize IND and safety prior to dc home.      Follow Up Recommendations SNF    Equipment Recommendations  None recommended by PT    Recommendations for Other Services       Precautions / Restrictions Precautions Precautions: Fall Restrictions Weight Bearing Restrictions: No      Mobility  Bed Mobility Overal bed mobility: Needs Assistance Bed Mobility: Rolling;Sidelying to Sit Rolling: Min assist Sidelying to sit: Mod assist       General bed mobility comments: Increased time with cues for sequence   Transfers Overall transfer level: Needs assistance Equipment used: Rolling walker (2 wheeled) Transfers: Sit to/from Stand Sit to Stand: Min assist;+2 physical assistance;+2 safety/equipment;From elevated surface         General transfer comment: cues for safety, LE management and use of UEs to self assist  Ambulation/Gait Ambulation/Gait assistance: Min assist;+2 physical assistance;+2 safety/equipment Gait Distance (Feet): 3 Feet Assistive device: Rolling walker (2 wheeled) Gait Pattern/deviations: Step-to pattern;Decreased step length - right;Decreased step length - left;Shuffle;Trunk flexed;Narrow base of support Gait velocity: decr   General Gait Details: short shuffling steps fwd/bkwd and up side of bed; increased time, flexed posture, physically shaking  Stairs            Wheelchair Mobility    Modified Rankin (Stroke Patients Only)       Balance Overall balance assessment: Needs assistance Sitting-balance  support: No upper extremity supported;Feet supported Sitting balance-Leahy Scale: Fair   Postural control: Posterior lean Standing balance support: Bilateral upper extremity supported Standing balance-Leahy Scale: Poor                               Pertinent Vitals/Pain Pain Assessment: Faces Faces Pain Scale: Hurts a little bit Pain Location: back, arms, legs, feet Pain Descriptors / Indicators: Aching;Sore Pain Intervention(s): Limited activity within patient's tolerance;Monitored during session    Home Living Family/patient expects to be discharged to:: Unsure Living Arrangements: Alone                    Prior Function Level of Independence: Independent         Comments: Pt home alone but states she has largely been staying on the couch since release from [facility]        Extremity/Trunk Assessment   Upper Extremity Assessment Upper Extremity Assessment: Generalized weakness    Lower Extremity Assessment Lower Extremity Assessment: Generalized weakness       Communication   Communication: No difficulties  Cognition Arousal/Alertness: Awake/alert Behavior During Therapy: Flat affect Overall Cognitive Status: No family/caregiver present to determine baseline cognitive functioning                                 General Comments: Pt requiring encouragement to participate but following most cues      General Comments      Exercises     Assessment/Plan    PT Assessment Patient  needs continued PT services  PT Problem List Decreased strength;Decreased activity tolerance;Decreased balance;Decreased mobility;Pain;Decreased knowledge of use of DME       PT Treatment Interventions DME instruction;Functional mobility training;Gait training;Therapeutic activities;Therapeutic exercise;Balance training;Patient/family education    PT Goals (Current goals can be found in the Care Plan section)  Acute Rehab PT  Goals Patient Stated Goal: Walk; feel better PT Goal Formulation: With patient Time For Goal Achievement: 10/19/19 Potential to Achieve Goals: Fair    Frequency Min 2X/week   Barriers to discharge Decreased caregiver support lives alone    Co-evaluation               AM-PAC PT "6 Clicks" Mobility  Outcome Measure Help needed turning from your back to your side while in a flat bed without using bedrails?: A Little Help needed moving from lying on your back to sitting on the side of a flat bed without using bedrails?: A Lot Help needed moving to and from a bed to a chair (including a wheelchair)?: A Lot Help needed standing up from a chair using your arms (e.g., wheelchair or bedside chair)?: A Lot Help needed to walk in hospital room?: A Lot Help needed climbing 3-5 steps with a railing? : Total 6 Click Score: 12    End of Session Equipment Utilized During Treatment: Gait belt Activity Tolerance: Patient limited by fatigue Patient left: Other (comment);with nursing/sitter in room(sitting EOB with CNA to work on hygiene s/p incontinence) Nurse Communication: Mobility status PT Visit Diagnosis: Muscle weakness (generalized) (M62.81);Difficulty in walking, not elsewhere classified (R26.2)    Time: 3832-9191 PT Time Calculation (min) (ACUTE ONLY): 15 min   Charges:   PT Evaluation $PT Eval Low Complexity: 1 Low          [place] Pager [phone] Office [phone]   Rilea Arutyunyan 10/05/2019, 2:36 PM

## 2019-10-05 NOTE — ED Notes (Signed)
Patient stated she needed to go to the bathroom but could not walk. Offered patient bedpan. Patient stated she could not get on the bedpan. Writer offered to help get patient on bedpan. Patient stated she did not want to use a bedpan. Writer educated patient that bedpan is only option due to patient's inability to walk. Patient then stated she feels like she doesn't have to go anymore, but that she may have already went. Writer offered to change patient and clean patient up. Patient stated she did not go, and that she is fine and did not need to be changed or cleaned.

## 2019-10-05 NOTE — BH Assessment (Signed)
Tele Assessment Note   Patient Name: Jeanette Rose MRN: [id number] Referring Physician: Dr. Deno Etienne.  Location of Patient: Jeanette Rose ED, [facility]. Location of Provider: [place] Department  Jeanette Rose is an 64 y.o. female, who presents voluntary and unaccompanied to [facility]. Clinician asked the pt, "what brought you to the hospital?" Pt reported, she's been sitting on the couch for 2-3 days, she couldn't get up; her legs and feet were numb. Pt reported, she has pain in her legs, hip, back and neck but the pain has worsened. Pt reported, she hasn't ate, used the bathroom or bathed/showered in 2-3 days. Pt reported, she feels overwhelmed and scared of everything. Pt reported, she's scared to drive, take her medication, eat, drive, bathe. Pt reported, she is unable to open he medications and she's cared to take them. Pt is unable to give a reason why she's afraid. Pt reported, she wants somebody to take care of her. Pt reported, in the past six months seeing a bottle of pills and thought about SI. Pt denies, SI, HI, AVH, self-injurious behaviors and access to weapons.   Pt consented for clinician to speak to her sister Ronnell Freshwater, [phone]) to obtain additional information. Per sister, the pt has a history of depression, and anxiety. Per sister, the pt was discharged from [facility] on 09/27/2019. Pt's sister reported, she called the ambulance because the pt could not put her thoughts together, could do anything, and is scared of everything. Pt's sister reported, the pt can not live on her own, she can't drive, walk to mailbox, take medications, set up/keep appointments, eat. Pt's sister reported, she was scared she was going to find her sister dead because she hasn't taken her medications including her Diabetes medications. Pt's sister reported, the pt has severe Diabetes, she thought the pt was in a diabetic coma. Pt reported, having a hard time remembering things. Pt's sister reported,  the pt had fear but it has increased I the past two months when the pt learned she had to move. Per sister, some family think the pt just wants to be taken care of, the family moved the pt while she was in [place]. Pt's sister reported, the pt still has some unpacking to do. Pt's sister reported, the pt's son died in 12-08-2009 (pt is still grieving) and the pt does not have a good relationship with her daughter or seen grand kids. Per sister, the pt has not answered her phone so she does not know if her new provider called to set up an appointment for medication management. Per sister, she feels the pt will not hurt herself if discharge but the pt is unable to care for herself.   Pt's UDS is pending. Pt was linked to Dr, Toy Care for medication management. Per pt, Dr, Toy Care said she has done all she can do and for the pt to get a second opinion with another provider. Pt denies, following up and taking her medications as prescribed. Pt reported, she was given samples while at [facility] to take home. Pt has a previous inpatient admission.  Pt presents quiet, awake with logical, coherent speech. Pt's eye contact was fair. Pt's mood, affect was anxious. Pt's thought process was thought blocking. Pt's judgement was partial. Pt was oriented x4. Pt's concentration was normal.  Pt's insight was poor. Pt's impulse control was fair. Pt reported, if discharged from [facility] she would not hurt herself but she feels she will go back doing what  she was doing (sitting on the couch unable to move). Clinician discussed the three possible dispositions (discharged with OPT resources, observe/reassess by psychiatry or inpatient treatment) in detail.   Diagnosis: Major Depressive Disorder, recurrent severe, without psychosis.                       GAD.  Past Medical History:  Past Medical History:  Diagnosis Date  . Acute acalculous cholecystitis s/p lap chole 04/03/2014 04/02/2014  . Allergy   . Anemia   . Anxiety   . Asthma   .  Depression   . Fatty liver    noted on CT per [facility] records  . Fibromyalgia   . GERD (gastroesophageal reflux disease)   . Hyperlipidemia associated with type 2 diabetes mellitus ([place])   . Hypertension   . Hypothyroid 2015   Transiently in 2015. Synthroid 29mg was stopped 05/07/2016 w/ tsh nml following.  . Obesity (BMI 30-39.9)   . OSA (obstructive sleep apnea)    non-compliant with CPAP  . Seborrheic dermatitis    [facility]  . Stroke ([facility]    TIA  . Type 2 diabetes mellitus ([place]     Past Surgical History:  Procedure Laterality Date  . BREAST REDUCTION SURGERY  06/2008  . CHOLECYSTECTOMY N/A 04/03/2014   Procedure: LAPAROSCOPIC CHOLECYSTECTOMY ;  Surgeon: SAdin Hector MD;  Location: [facility] ORS;  Service: General;  Laterality: N/A;  . REDUCTION MAMMAPLASTY    . TUBAL LIGATION      Family History:  Family History  Problem Relation Age of Onset  . Atrial fibrillation Mother   . Heart disease Father        CAD  . Diabetes Brother   . Cancer Maternal Grandmother   . Diabetes Paternal Grandmother   . Fibromyalgia Sister   . Colitis Sister     Social History:  reports that she quit smoking about 15 years ago. Her smoking use included cigarettes. She has a 60.00 pack-year smoking history. She has never used smokeless tobacco. She reports that she does not drink alcohol or use drugs.  Additional Social History:  Alcohol / Drug Use Pain Medications: See MAR Prescriptions: See MAR Over the Counter: See MAR History of alcohol / drug use?: No history of alcohol / drug abuse  CIWA: CIWA-Ar BP: (!) 161/95 Pulse Rate: (!) 102 COWS:    Allergies:  Allergies  Allergen Reactions  . Farxiga [Dapagliflozin] Anxiety and Other (See Comments)    Made depression and anxiety worse  . Rexulti [Brexpiprazole] Anxiety and Other (See Comments)    Made anxiety worse  . Trintellix [Vortioxetine] Anxiety and Other (See Comments)    Made anxiety  worse    Home Medications: (Not in a hospital admission)   OB/GYN Status:  No LMP recorded. Patient is postmenopausal.  General Assessment Data Assessment unable to be completed: Yes Reason for not completing assessment: Clinician spoke to ACaryl Pina RN and noted the pt is in a room that is shared with another pt (with a curtain dividing.) Clinician noted from RN that the pt was placed in a hall bed. Clinician asked RN to call once pt is placed in a private room so her TTS assessment can be completed [facility] Assessment: [facility] TTS Assessment: In system Is this a Tele or Face-to-Face Assessment?: Tele Assessment Is this an Initial Assessment or a Re-assessment for this encounter?: Initial Assessment Patient Accompanied by:: N/A Language Other  than English: No Living Arrangements: Other (Comment)([place].) What gender do you identify as?: Female Marital status: Single Living Arrangements: Alone Can pt return to current living arrangement?: Yes Admission Status: Voluntary Is patient capable of signing voluntary admission?: Yes Referral Source: Self/Family/Friend Insurance type: [employer].      Crisis Care Plan Living Arrangements: Alone Legal Guardian: Other:(Self.) Name of Psychiatrist: Pending.  Name of Therapist: NA  Education Status Is patient currently in school?: No Is the patient employed, unemployed or receiving disability?: Receiving disability income  Risk to self with the past 6 months Suicidal Ideation: No-Not Currently/Within Last 6 Months(Passively suicidal.) Has patient been a risk to self within the past 6 months prior to admission? : Yes Suicidal Intent: No Has patient had any suicidal intent within the past 6 months prior to admission? : No Is patient at risk for suicide?: No Suicidal Plan?: No Has patient had any suicidal plan within the past 6 months prior to admission? : No Access to Means: No What has been your use of drugs/alcohol  within the last 12 months?: UDS is pending.  Previous Attempts/Gestures: No How many times?: 0 Other Self Harm Risks: Pt not taking medications.  Triggers for Past Attempts: None known Intentional Self Injurious Behavior: None(Pt denies. ) Family Suicide History: No Recent stressful life event(s): Other (Comment)(scared, overwhelmed by everything, needs care.) Persecutory voices/beliefs?: No Depression: Yes Depression Symptoms: Feeling worthless/self pity, Insomnia, Isolating Substance abuse history and/or treatment for substance abuse?: No Suicide prevention information given to non-admitted patients: Not applicable  Risk to Others within the past 6 months Homicidal Ideation: No(Pt denies. ) Does patient have any lifetime risk of violence toward others beyond the six months prior to admission? : No(Pt denies. ) Thoughts of Harm to Others: No(Pt denies. ) Current Homicidal Intent: No Current Homicidal Plan: No Access to Homicidal Means: No Identified Victim: NA History of harm to others?: No Assessment of Violence: None Noted Violent Behavior Description: NA Does patient have access to weapons?: No Criminal Charges Pending?: No Does patient have a court date: No Is patient on probation?: No  Psychosis Hallucinations: None noted Delusions: None noted  Mental Status Report Appearance/Hygiene: Unremarkable Eye Contact: Fair Motor Activity: Unremarkable Speech: Logical/coherent Level of Consciousness: Quiet/awake Mood: Anxious Affect: Anxious Anxiety Level: Severe Thought Processes: Thought Blocking Judgement: Partial Orientation: Person, Place, Time, Situation Obsessive Compulsive Thoughts/Behaviors: None  Cognitive Functioning Concentration: Normal Memory: Recent Impaired Is patient IDD: No Insight: Poor Impulse Control: Fair Appetite: Poor Sleep: Decreased Total Hours of Sleep: (Pt reported, not sleeping for three days. ) Vegetative Symptoms: Not bathing,  Decreased grooming(on couch. )  ADLScreening [facility] Assessment Services) Patient's cognitive ability adequate to safely complete daily activities?: Yes Patient able to express need for assistance with ADLs?: Yes Independently performs ADLs?: Yes (appropriate for developmental age)  Prior Inpatient Therapy Prior Inpatient Therapy: Yes Prior Therapy Dates: 08/2019 Prior Therapy Facilty/Provider(s):  [facility] Reason for Treatment: Anxiety, SI.   Prior Outpatient Therapy Prior Outpatient Therapy: No(New provider is pending. ) Does patient have an ACCT team?: No Does patient have Intensive In-House Services?  : No Does patient have [facility] services? : No Does patient have P4CC services?: No  ADL Screening (condition at time of admission) Patient's cognitive ability adequate to safely complete daily activities?: Yes Is the patient deaf or have difficulty hearing?: No Does the patient have difficulty seeing, even when wearing glasses/contacts?: No Does the patient have difficulty concentrating, remembering, or making decisions?: Yes Patient able to  express need for assistance with ADLs?: Yes Does the patient have difficulty dressing or bathing?: No Independently performs ADLs?: Yes (appropriate for developmental age) Does the patient have difficulty walking or climbing stairs?: No Weakness of Legs: None Weakness of Arms/Hands: None  Home Assistive Devices/Equipment Home Assistive Devices/Equipment: None    Abuse/Neglect Assessment (Assessment to be complete while patient is alone) Abuse/Neglect Assessment Can Be Completed: Yes Physical Abuse: Denies Verbal Abuse: Denies Sexual Abuse: Denies Exploitation of patient/patient's resources: Denies Self-Neglect: Denies     Regulatory affairs officer (For Healthcare) Does Patient Have a Medical Advance Directive?: No Would patient like information on creating a medical advance directive?: No - Patient declined          Disposition: Talbot Grumbling, NP pt is psych cleared. Pt to meet with social work in the am to discuss placement options. Pt to follow up with OPT resources. Disposition discussed with Dr. Leonette [facility].   Disposition Initial Assessment Completed for this Encounter: Yes  This service was provided via telemedicine using a 2-way, interactive audio and video technology.  Names of all persons participating in this telemedicine service and their role in this encounter. Name: Miarose Lippert. Role: Patient.  Name: Vertell Novak, MS, [facility], [place]. Role: Counselor.           Vertell Novak 10/05/2019 2:18 AM     Vertell Novak, [place], [facility], [place] Triage Specialist [phone]

## 2019-10-05 NOTE — NC FL2 (Signed)
[place] LEVEL OF CARE SCREENING TOOL     IDENTIFICATION  Patient Name: Jeanette Rose Birthdate: 03-14-1956 Sex: female Admission Date (Current Location): 10/04/2019  [facility] and [place] Number:  Herbalist and Address:  [facility],  [place] [address], [place]      Provider Number: [id number]  Attending Physician Name and Address:  Default, Provider, MD  Relative Name and Phone Number:  [name] (sister) [phone]    Current Level of Care: Hospital Recommended Level of Care: [place] Prior Approval Number:    Date Approved/Denied:   PASRR Number: [id number]  Discharge Plan: SNF    Current Diagnoses: Patient Active Problem List   Diagnosis Date Noted  . Persistent depressive disorder with anxious distress, currently severe 09/16/2019  . MDD (major depressive disorder), recurrent severe, without psychosis ([place]) 09/11/2019  . Major depression 03/15/2019  . Severe recurrent major depression without psychotic features ([place]) 03/15/2019  . Generalized anxiety disorder 03/04/2019  . Benzodiazepine dependence, continuous ([place]) 03/04/2019  . MDD (major depressive disorder), recurrent episode, severe ([place]) 03/04/2019  . Positive colorectal cancer screening using Cologuard test 02/15/2018  . Seborrheic dermatitis   . History of hypothyroidism 10/26/2017  . Elevated ferritin level 10/26/2017  . Vitamin D deficiency 10/26/2017  . Diabetic peripheral neuropathy associated with type 2 diabetes mellitus ([place]) 10/26/2017  . OSA (obstructive sleep apnea)   . Hyperlipidemia LDL goal <100   . Major depressive disorder, recurrent episode ([place])   . Constipation 04/22/2014  . Steatohepatitis 04/03/2014  . Anxiety   . Type 2 diabetes mellitus with diabetic polyneuropathy, with long-term current use of insulin ([place])   . GERD (gastroesophageal reflux disease)   . Hypertension   . Obesity (BMI 30-39.9)     Orientation  RESPIRATION BLADDER Height & Weight     Self, Time, Situation, Place  Normal Continent Weight:   Height:     BEHAVIORAL SYMPTOMS/MOOD NEUROLOGICAL BOWEL NUTRITION STATUS      Continent Diet(Regular)  AMBULATORY STATUS COMMUNICATION OF NEEDS Skin   Limited Assist Verbally Normal                       Personal Care Assistance Level of Assistance  Bathing, Feeding, Dressing Bathing Assistance: Limited assistance Feeding assistance: Limited assistance Dressing Assistance: Limited assistance     Functional Limitations Info  Sight, Hearing, Speech Sight Info: Adequate Hearing Info: Adequate Speech Info: Adequate    SPECIAL CARE FACTORS FREQUENCY  PT (By licensed PT), OT (By licensed OT)     PT Frequency: 5x weekly OT Frequency: 5x weekly            Contractures Contractures Info: Not present    Additional Factors Info  Code Status, Allergies Code Status Info: Full Allergies Info: Farxiga (Dapagliflozin), Rexulti (Brexpiprazole), Trintellix (Vortioxetine)           Current Medications (10/05/2019):  This is the current hospital active medication list Current Facility-Administered Medications  Medication Dose Route Frequency Provider Last Rate Last Admin  . aspirin EC tablet 81 mg  81 mg Oral Daily Deno Etienne, DO   81 mg at 10/05/19 0957  . [START ON 10/06/2019] atorvastatin (LIPITOR) tablet 80 mg  80 mg Oral QHS Deno Etienne, DO      . busPIRone (BUSPAR) tablet 30 mg  30 mg Oral BID Deno Etienne, DO   30 mg at 10/05/19 0957  . diazepam (VALIUM) tablet 5-10 mg  5-10 mg  Oral TID PRN Deno Etienne, DO      . docusate sodium (COLACE) capsule 100 mg  100 mg Oral BID Deno Etienne, DO   100 mg at 10/05/19 0958  . fluvoxaMINE (LUVOX) tablet 100 mg  100 mg Oral QHS Deno Etienne, DO      . Iloperidone TABS 2 mg  2 mg Oral BID Deno Etienne, DO   2 mg at 10/05/19 9774  . insulin glargine (LANTUS) injection 28 Units  28 Units Subcutaneous q morning - 10a Deno Etienne, DO   28 Units at 10/05/19  0959  . irbesartan (AVAPRO) tablet 300 mg  300 mg Oral Daily Deno Etienne, DO   300 mg at 10/05/19 1423  . metFORMIN (GLUCOPHAGE-XR) 24 hr tablet 1,000 mg  1,000 mg Oral Q breakfast Deno Etienne, DO   1,000 mg at 10/05/19 0957  . pantoprazole (PROTONIX) EC tablet 40 mg  40 mg Oral BID Deno Etienne, DO   40 mg at 10/05/19 1000   Current Outpatient Medications  Medication Sig Dispense Refill  . aspirin 81 MG EC tablet Take 1 tablet (81 mg total) by mouth daily. Swallow whole. 30 tablet 0  . atorvastatin (LIPITOR) 80 MG tablet Take 1 tablet (80 mg total) by mouth at bedtime. 30 tablet 0  . busPIRone (BUSPAR) 30 MG tablet Take 1 tablet (30 mg total) by mouth 2 (two) times daily. 60 tablet 0  . diazepam (VALIUM) 5 MG tablet Take 5-10 mg by mouth 3 (three) times daily as needed for anxiety.    . docusate sodium (COLACE) 100 MG capsule Take 1 capsule (100 mg total) by mouth 2 (two) times daily. 60 capsule 0  . fluvoxaMINE (LUVOX) 100 MG tablet Take 1 tablet (100 mg total) by mouth at bedtime. 30 tablet 0  . Iloperidone 2 MG TABS Take 1 tablet (2 mg total) by mouth 2 (two) times daily. 60 tablet 0  . insulin glargine (LANTUS) 100 UNIT/ML injection Inject 0.28 mLs (28 Units total) into the skin every morning. 10 mL 0  . irbesartan (AVAPRO) 300 MG tablet Take 1 tablet (300 mg total) by mouth daily. 30 tablet 0  . metFORMIN (FORTAMET) 1000 MG (OSM) 24 hr tablet Take 1 tablet (1,000 mg total) by mouth daily with breakfast. 30 tablet 0  . pantoprazole (PROTONIX) 40 MG tablet Take 1 tablet (40 mg total) by mouth 2 (two) times daily. 60 tablet 0     Discharge Medications: Please see discharge summary for a list of discharge medications.  Relevant Imaging Results:  Relevant Lab Results:   Additional Information SS# [id number]  Janace Hoard, LCSW

## 2019-10-05 NOTE — BHH Counselor (Signed)
Clinician spoke to [place], Therapist, sports and noted the pt is in a room that is shared with another pt (with a curtain dividing.) Clinician noted from RN that the pt was placed in a hall bed. Clinician asked RN to call once pt is placed in a private room so her TTS assessment can be completed.    Vertell Novak, [place], [facility], [facility] Triage Specialist [phone] or [phone].

## 2019-10-06 LAB — RESPIRATORY PANEL BY RT PCR (FLU A&B, COVID)
Influenza A by PCR: NEGATIVE
Influenza B by PCR: NEGATIVE
SARS Coronavirus 2 by RT PCR: NEGATIVE

## 2019-10-06 NOTE — Progress Notes (Signed)
CSW updated CSW Advanced Care Supervisor of pt's choices thus far and was directed to request assistance from EPD in directing nursing to continually walk and to encourage pt to walk to determine pt's ability to ambulate and pt's possible need for DME for D/C should pt not find SNF placement.  Consult with CSW Leaderships also resulted in a decision to consider contacting [facility] Medical Director Lynder Parents at D/C to request assistance if placement is not found so that pt can b followed up on at home once D/C'd should placement not be found.  CSW called and spoke to [place] in admission at [facility] who is reviewing pt's referral shortly.  CSW will continue to follow for D/C needs.  Alphonse Guild. Senay Sistrunk, LCSW, LCAS, CSI Transitions of Care Clinical Social Worker Care Coordination Department Ph: [phone]

## 2019-10-06 NOTE — Progress Notes (Addendum)
CSW called pt's sister Ronnell Freshwater at ph: [phone].  CSW recounted current attempts at placement and why SNF placement in the [facility] ED this stay have not been successful thus far.  Pt's sister voiced understanding and voiced understanding again when CSW prepared pt's sister for eventual discharge of the pt if it is determined that a SNF placement can not be found in a timely manner.  CSW conducted a lengthy conversation in an attempt to emphasize to the pt's sister the long-term goal of obtaining guardianship, should a judge find it appropriate but after repeated attempts to assist pt's sister in understanding that going forward the helpfullnes of having guardianship in place to assist others in helping the pt's sister the pt's sister continually demurred this plan as pt's sister, "would rather DSS take over guardianship because I don't want to do it".  CSW voiced understanding and advised the pt's sister the CSW would continue to keep pt's sister updated as to pt's disposition.  4:41 PM CSW awaiting return call from SNF referrals made earlier.  CSW will continue to follow for D/C needs.  Jeanette Rose. Jeanette Markin, LCSW, LCAS, CSI Transitions of Care Clinical Social Worker Care Coordination Department Ph: [phone]

## 2019-10-06 NOTE — Progress Notes (Signed)
CSW spoke to EDP who will speak to pt's  RN about walking the pt.  CSW will continue to follow for D/C needs.  Alphonse Guild. William Laske, LCSW, LCAS, CSI Transitions of Care Clinical Social Worker Care Coordination Department Ph: [phone]

## 2019-10-06 NOTE — Progress Notes (Addendum)
CSW spoke to EDP who is agreeable to placing another PT visit and to order a COVID test for placement needs.  CSW notes OT's recommendation for SNF long-term care, but barrier to this is that pt does not have, nor does not qualify for Medicaid,per pt's sister.   Of note: Long-term care, at a SNF or ALF is either Medicaid or private pay and per pt's sister pt/pt's familoy does not have the resources to private pay.  CSW will continue to follow for D/C needs.  Alphonse Guild. Latoiya Maradiaga, LCSW, LCAS, CSI Transitions of Care Clinical Social Worker Care Coordination Department Ph: [phone]

## 2019-10-06 NOTE — Progress Notes (Addendum)
Pt was referred out to area SNF's on 10/04/2018.  [facility], [place], [place], [facility], is not taking any admissions until at least next week, date undetermined, the following facilities have denied the pt:  1.  [facility] 2.  [facility] (stated pt is appropriate for psyche vs SNF) 3. [facility] 4. [facility] 5. [facility] 6. [facility]    CSW also called but has not heard back from:  1. Gerald Stabs at [facility]  3. Freda Munro at [facility] 4. [facility]  The following SNF's were not taking phone calls:  1.  [facility] 2. [facility]  The following SNF's are only taking COVID patients  1.  [place] called Trey Sailors with [facility]   CSW will continue to follow for D/C needs.  Alphonse Guild. Liberti Appleton, LCSW, LCAS, CSI Transitions of Care Clinical Social Worker Care Coordination Department Ph: [phone]

## 2019-10-06 NOTE — Progress Notes (Signed)
CSW received a call from Trey Sailors with [facility] [phone] who states he is e route to review pt's referral and should see it by 4pm today (10/05/2018).CSW will continue to follow for D/C needs.  Jeanette Rose. Sheera Illingworth, LCSW, LCAS, CSI Transitions of Care Clinical Social Worker Care Coordination Department Ph: [phone]

## 2019-10-06 NOTE — Progress Notes (Addendum)
Occupational Therapy Evaluation Patient Details Name: Jeanette Rose MRN: [id number] DOB: [birth year] Today's Date: 10/06/2019    History of Present Illness Pt admitted to ED with debilitating anxiety and inability to self care   Clinical Impression   PTA, pt was living at home alone following recent d/c from [facility]. Per chart, pt was not completing ADL/IADL and spent her time on the couch. Pt currently requires totalA to initiate tasks/movement, she demonstrates severe self-limiting behaviors frequently stating "I can't move". It is difficult to assess pt's physical abilities secondary to her cognitive limitations (please see cognition section). She currently requires maxA-totalA for all ADL. Due to decline in current level of function, pt would benefit from acute OT to address established goals to facilitate safe D/C to venue listed below. At this time, recommend SNF follow-up and possibly long term care. Will continue to follow acutely.     Follow Up Recommendations  SNF(long term care)    Equipment Recommendations  3 in 1 bedside commode    Recommendations for Other Services       Precautions / Restrictions Precautions Precautions: Fall Restrictions Weight Bearing Restrictions: No      Mobility Bed Mobility Overal bed mobility: Needs Assistance Bed Mobility: Supine to Sit;Sit to Supine     Supine to sit: Mod assist;Max assist;HOB elevated Sit to supine: Mod assist;Max assist;HOB elevated   General bed mobility comments: decreased initiation;  Transfers                 General transfer comment: deferred due to safety    Balance Overall balance assessment: Needs assistance Sitting-balance support: No upper extremity supported;Feet supported Sitting balance-Leahy Scale: Fair Sitting balance - Comments: able to maintain static sitting balance                                   ADL either performed or assessed with clinical judgement   ADL Overall  ADL's : Needs assistance/impaired Eating/Feeding: Set up;Sitting                                     General ADL Comments: difficult to accurately assess pt's physical abiltiy to complete ADL. However, due to cognition, pt requires maxA-totalA for ADL and will require +2 for transfers;pt with significant self-limiting behaviors (see cognition section)     Vision Patient Visual Report: No change from baseline       Perception     Praxis      Pertinent Vitals/Pain Pain Assessment: No/denies pain Pain Intervention(s): Monitored during session     Hand Dominance     Extremity/Trunk Assessment Upper Extremity Assessment Upper Extremity Assessment: Generalized weakness;Difficult to assess due to impaired cognition   Lower Extremity Assessment Lower Extremity Assessment: Generalized weakness;Difficult to assess due to impaired cognition       Communication Communication Communication: No difficulties   Cognition Arousal/Alertness: Awake/alert Behavior During Therapy: Flat affect Overall Cognitive Status: No family/caregiver present to determine baseline cognitive functioning                                 General Comments: Pt with severe self-limiting behaviors;she reports she is unable to move her arms/legs However, she confirms feeding herself lunch;pt able to maintain sitting balance without support, while sitting EOB stated I  can't do it, once therapist brought to her attention she didn't need support pt began to very slowly descend to side requiring therapist's very light touch on her arm and pt self-corrected; When assessing mobility pt with slow upper extremity movements, demonstrated quick elbow flexion to itch eye   General Comments  vss    Exercises     Shoulder Instructions      Home Living Family/patient expects to be discharged to:: Unsure Living Arrangements: Alone                                      Prior  Functioning/Environment Level of Independence: Independent        Comments: Pt home alone but states she has largely been staying on the couch since release from [facility] chart, pt has been unable to complete any ADL (toileting, medication management, feeding, etc)        OT Problem List: Decreased strength;Decreased range of motion;Decreased activity tolerance;Impaired balance (sitting and/or standing);Decreased cognition;Decreased safety awareness;Decreased knowledge of precautions;Impaired UE functional use      OT Treatment/Interventions: Self-care/ADL training;Therapeutic exercise;Energy conservation;DME and/or AE instruction;Therapeutic activities;Cognitive remediation/compensation;Patient/family education;Balance training    OT Goals(Current goals can be found in the care plan section) Acute Rehab OT Goals Patient Stated Goal: pt did not state OT Goal Formulation: With patient Time For Goal Achievement: 10/20/19 Potential to Achieve Goals: Good ADL Goals Pt Will Perform Grooming: with modified independence;sitting Pt Will Perform Lower Body Dressing: with modified independence;sit to/from stand Pt Will Transfer to Toilet: with modified independence;ambulating Additional ADL Goal #1: Pt will initiate task during ADL and functional mobiltiy with minimal prompting.  OT Frequency: Min 2X/week   Barriers to D/C: Decreased caregiver support  pt lives alone, is unable to properly care for herself       Co-evaluation              AM-PAC OT "6 Clicks" Daily Activity     Outcome Measure Help from another person eating meals?: A Little Help from another person taking care of personal grooming?: A Lot Help from another person toileting, which includes using toliet, bedpan, or urinal?: A Lot Help from another person bathing (including washing, rinsing, drying)?: Total Help from another person to put on and taking off regular upper body clothing?: A Lot Help from another person  to put on and taking off regular lower body clothing?: Total 6 Click Score: 11   End of Session Nurse Communication: Mobility status  Activity Tolerance: Patient tolerated treatment well Patient left: in bed;with bed alarm set  OT Visit Diagnosis: Unsteadiness on feet (R26.81);Other abnormalities of gait and mobility (R26.89);Muscle weakness (generalized) (M62.81);Other symptoms and signs involving cognitive function                Time: 0768-0881 OT Time Calculation (min): 19 min Charges:  OT General Charges $OT Visit: 1 Visit OT Evaluation $OT Eval Moderate Complexity: [place] OTR/L [place] Office: [place] 10/06/2019, 4:53 PM

## 2019-10-07 ENCOUNTER — Encounter (HOSPITAL_COMMUNITY): Payer: Self-pay | Admitting: Behavioral Health

## 2019-10-07 LAB — CBG MONITORING, ED: Glucose-Capillary: 131 mg/dL — ABNORMAL HIGH (ref 70–99)

## 2019-10-07 NOTE — ED Notes (Signed)
Pt is coughing in her sleep

## 2019-10-07 NOTE — ED Provider Notes (Signed)
Requested to see patient by her nurse.  Patient has been refusing medications and states she has trouble swallowing.  I personally gave patient water she is able to swallow it without evidence of emesis.  Patient states that she does not have an appetite as well 2.  Extremely encouraged the patient to eat and drink as this will facilitate placement.   Lacretia Leigh, MD 10/07/19 1115

## 2019-10-07 NOTE — ED Notes (Signed)
Pt stated she "cant do anything".  Pt refuses to eat. Says are arms to weak to feed self. Pt stated, "cant eat, cant swallow".  Pt tearful. Pt in bed lying comfortably.

## 2019-10-07 NOTE — Progress Notes (Signed)
CSW confirmed with psychiatry TTS consult was placed and TTS is to see the pt.  CSW will continue to follow for D/C needs.  Alphonse Guild. Willadene Mounsey, LCSW, LCAS, CSI Transitions of Care Clinical Social Worker Care Coordination Department Ph: [phone]

## 2019-10-07 NOTE — BH Assessment (Signed)
Tele Assessment Note   Patient Name: Jeanette Rose MRN: [id number] Referring Physician: Ellin Mayhew, MD Location of Patient: [facility] Location of Provider: [place] [facility] Department  Jeanette Rose is a 64 y.o. female who presented to [facility] on 10/05/2019 with complaint of inability to move for two-three days.  Per 10/05/19 assessment, Pt sat on her couch for days because her legs were numb, and she was unable to move.  Pt also reported that she had not eaten, used the bathroom, or bathed for three days.  Pt was psych-cleared on 10/05/19 and plans were made to move her to a SNF.  Pt now refuses to eat, and she states that she has difficulty moving to the bathroom.  A new TTS consult was requested.   Pt presented as agitated, and she expressed significant helplessness -- she said that she cannot move her legs, that she cannot feed herself, clothe herself, and bathe herself.  Pt reported that she has felt anxious for years, and she has struggled to move for about a week.  Pt reported in previous assessment that she wants someone to Rose for her.    Per previous assessment, Pt's sister stated that Pt cannot live on her own because she cannot drive, ambulate, take medication, make and keep appointments, or prepare food.  Pt's sister also reported that Pt has diabetes.    Pt endorsed anxiety.  She also endorsed persistent despondency, insomnia, fatigue, hopelessness.  She denied current suicidal ideation, homicidal ideation, hallucination, self-injurious behavior, and substance use concerns.  Pt stated, ''I feel like this whole thing (the conversation) is a hallucination.''  Pt expressed hopelessness and helplessness, and she reported several times, ''I can't answer your questions... I don't know what to say.''  Pt stated that she cannot walk, feed herself, or take medication.       Pt lives alone in [place], and she receives disability income.  Per history, Pt was followed by [facility], and [facility] has referred  for a second opinion.  Per history, Pt's son died in Dec 13, 2009, and Pt continues to grieve.  Pt was last treated inpatient at [facility] in December 2021.  Consulted with Jeanette Sheng NP, who consulted with Jeanette Earing, MD.  Per Dr. Parke Poisson, Pt is psych-cleared.  He recommended that Pt follow-up to receive ECT, which was partially effective with Pt in past.   Diagnosis: Major Depressive Disorder, Recurrent, Severe w/o psychotic features; GAD; r/o Conversion Disorder  Past Medical History:  Past Medical History:  Diagnosis Date  . Acute acalculous cholecystitis s/p lap chole 04/03/2014 04/02/2014  . Allergy   . Anemia   . Anxiety   . Asthma   . Depression   . Fatty liver    noted on CT per [facility] records  . Fibromyalgia   . GERD (gastroesophageal reflux disease)   . Hyperlipidemia associated with type 2 diabetes mellitus ([place])   . Hypertension   . Hypothyroid 12/13/2013   Transiently in 12/13/2013. Synthroid 68mg was stopped 05/07/2016 w/ tsh nml following.  . Obesity (BMI 30-39.9)   . OSA (obstructive sleep apnea)    non-compliant with CPAP  . Seborrheic dermatitis    [facility] face and [facility]  . Stroke ([facility]    TIA  . Type 2 diabetes mellitus ([place]     Past Surgical History:  Procedure Laterality Date  . BREAST REDUCTION SURGERY  06/2008  . CHOLECYSTECTOMY N/A 04/03/2014   Procedure: LAPAROSCOPIC CHOLECYSTECTOMY ;  Surgeon: SAdin Hector  MD;  Location: [facility] ORS;  Service: General;  Laterality: N/A;  . REDUCTION MAMMAPLASTY    . TUBAL LIGATION      Family History:  Family History  Problem Relation Age of Onset  . Atrial fibrillation Mother   . Heart disease Father        CAD  . Diabetes Brother   . Cancer Maternal Grandmother   . Diabetes Paternal Grandmother   . Fibromyalgia Sister   . Colitis Sister     Social History:  reports that she quit smoking about 15 years ago. Her smoking use included cigarettes. She has a 60.00 pack-year smoking history. She has never used  smokeless tobacco. She reports that she does not drink alcohol or use drugs.  Additional Social History:  Alcohol / Drug Use Pain Medications: See MAR Prescriptions: See MAR Over the Counter: See MAR History of alcohol / drug use?: No history of alcohol / drug abuse  CIWA: CIWA-Ar BP: 127/68 Pulse Rate: 99 COWS:    Allergies:  Allergies  Allergen Reactions  . Farxiga [Dapagliflozin] Anxiety and Other (See Comments)    Made depression and anxiety worse  . Rexulti [Brexpiprazole] Anxiety and Other (See Comments)    Made anxiety worse  . Trintellix [Vortioxetine] Anxiety and Other (See Comments)    Made anxiety worse    Home Medications: (Not in a Rose admission)   OB/GYN Status:  No LMP recorded. Patient is postmenopausal.  General Assessment Data Assessment unable to be completed: Yes Reason for not completing assessment: Clinician spoke to [name], RN and noted the pt is in a room that is shared with another pt (with a curtain dividing.) Clinician noted from RN that the pt was placed in a hall bed. Clinician asked RN to call once pt is placed in a private room so her TTS assessment can be completed Location of Assessment: [facility] ED TTS Assessment: In system Is this a Tele or Face-to-Face Assessment?: Tele Assessment Is this an Initial Assessment or a Re-assessment for this encounter?: Initial Assessment Patient Accompanied by:: N/A Language Other than English: No Living Arrangements: Other (Comment)(Lives alone) What gender do you identify as?: Female Marital status: Divorced Pregnancy Status: No Living Arrangements: Alone Can pt return to current living arrangement?: Yes Admission Status: Voluntary Is patient capable of signing voluntary admission?: Yes Referral Source: Self/Family/Friend Insurance type: [facility]     Crisis Rose Plan Living Arrangements: Alone Legal Guardian: Other:(Self) Name of Psychiatrist: None currently(Was w/Jeanette Rose, but was referred for  2nd opinion) Name of Therapist: None indicated  Education Status Is patient currently in school?: No Is the patient employed, unemployed or receiving disability?: Receiving disability income  Risk to self with the past 6 months Suicidal Ideation: No-Not Currently/Within Last 6 Months Has patient been a risk to self within the past 6 months prior to admission? : Yes Suicidal Intent: No Has patient had any suicidal intent within the past 6 months prior to admission? : No Is patient at risk for suicide?: No Suicidal Plan?: No Has patient had any suicidal plan within the past 6 months prior to admission? : No Access to Means: No What has been your use of drugs/alcohol within the last 12 months?: None indicated Previous Attempts/Gestures: No How many times?: 0 Other Self Harm Risks: Not taking medication Triggers for Past Attempts: None known Intentional Self Injurious Behavior: None Family Suicide History: No Recent stressful life event(s): Other (Comment)(Feeling helpless, states that she is too weak to Rose for se)  Persecutory voices/beliefs?: No Depression: Yes Depression Symptoms: Tearfulness, Isolating, Insomnia, Despondent, Feeling worthless/self pity, Loss of interest in usual pleasures Substance abuse history and/or treatment for substance abuse?: No Suicide prevention information given to non-admitted patients: Not applicable  Risk to Others within the past 6 months Homicidal Ideation: No Does patient have any lifetime risk of violence toward others beyond the six months prior to admission? : No Thoughts of Harm to Others: No Current Homicidal Intent: No Current Homicidal Plan: No Access to Homicidal Means: No Identified Victim: NA History of harm to others?: No Assessment of Violence: None Noted Violent Behavior Description: NA Does patient have access to weapons?: No Criminal Charges Pending?: No Does patient have a court date: No Is patient on probation?:  No  Psychosis Hallucinations: None noted Delusions: None noted  Mental Status Report Appearance/Hygiene: Unremarkable Eye Contact: Good Motor Activity: Restlessness, Freedom of movement(pt stated cannot walk and weak) Speech: Logical/coherent, Other (Comment)(Tremorous) Level of Consciousness: Alert Mood: Fearful, Anxious Affect: Anxious, Fearful Anxiety Level: Severe Thought Processes: Coherent, Relevant Judgement: Partial Orientation: Person, Place, Time, Situation Obsessive Compulsive Thoughts/Behaviors: None  Cognitive Functioning Concentration: Normal Memory: Recent Intact, Remote Intact Is patient IDD: No Insight: Poor Impulse Control: Fair Appetite: Poor(Pt stating now that she cannot eat) Have you had any weight changes? : No Change Sleep: Decreased Total Hours of Sleep: (Mixed) Vegetative Symptoms: Not bathing, Decreased grooming(on couch. )  ADLScreening [facility] Assessment Services) Patient's cognitive ability adequate to safely complete daily activities?: Yes Patient able to express need for assistance with ADLs?: Yes Independently performs ADLs?: Yes (appropriate for developmental age)  Prior Inpatient Therapy Prior Inpatient Therapy: Yes Prior Therapy Dates: 08/2019 Prior Therapy Facilty/Provider(s): [facility] Reason for Treatment: Anxiety, SI.   Prior Outpatient Therapy Prior Outpatient Therapy: Yes Prior Therapy Dates: 2020 Prior Therapy Facilty/Provider(s): [facility] Reason for Treatment: Med mang Does patient have an ACCT team?: No Does patient have Intensive In-House Services?  : No Does patient have [facility] services? : No Does patient have P4CC services?: No  ADL Screening (condition at time of admission) Patient's cognitive ability adequate to safely complete daily activities?: Yes Is the patient deaf or have difficulty hearing?: No Does the patient have difficulty seeing, even when wearing glasses/contacts?: No Does the patient have difficulty  concentrating, remembering, or making decisions?: No Patient able to express need for assistance with ADLs?: Yes Does the patient have difficulty dressing or bathing?: Yes(Pt endorsed weakness in legs that make it difficult for her to self-Rose) Independently performs ADLs?: Yes (appropriate for developmental age) Does the patient have difficulty walking or climbing stairs?: No Weakness of Legs: (See notes) Weakness of Arms/Hands: (See notes)  Home Assistive Devices/Equipment Home Assistive Devices/Equipment: None  Therapy Consults (therapy consults require a physician order) PT Evaluation Needed: (See notes) Abuse/Neglect Assessment (Assessment to be complete while patient is alone) Abuse/Neglect Assessment Can Be Completed: Yes Physical Abuse: Denies Verbal Abuse: Denies Sexual Abuse: Denies Exploitation of patient/patient's resources: Denies Self-Neglect: Yes, present (Comment)(See notes) Values / Beliefs Cultural Requests During Hospitalization: None Spiritual Requests During Hospitalization: None Consults Spiritual Rose Consult Needed: No Transition of Rose Team Consult Needed: No Advance Directives (For Healthcare) Does Patient Have a Medical Advance Directive?: No Would patient like information on creating a medical advance directive?: No - Patient declined          Disposition:  Disposition Initial Assessment Completed for this Encounter: Yes Disposition of Patient: Discharge(Per Jeanette Earing, MD, Pt is psych-cleared.)  This service was provided  via telemedicine using a 2-way, interactive audio and Radiographer, therapeutic.  Names of all persons participating in this telemedicine service and their role in this encounter. Name: Jeanette Rose Role: Patient             Marlowe Aschoff 10/07/2019 4:14 PM

## 2019-10-07 NOTE — Progress Notes (Signed)
Physical Therapy Treatment Patient Details Name: Jeanette Rose MRN: [id number] DOB: 06-Jul-1956 Today's Date: 10/07/2019    History of Present Illness Pt admitted to ED with debilitating anxiety and inability to self care    PT Comments    Pt requires significant encouragement for participation with PT and continues to demonstrate self-limiting behaviors making accurate assessment of true capabilities difficult.  Pt stating "I can't move my arms and legs" but able to perform with min assist "but you're doing all of it, I can't do it".  Pt stating she can't hold herself up in sitting while sitting at EOB unassisted.  Pt continues to require significant assist for performance of all basic mobility tasks making SNF level follow-up most appropriate dc plan.  Follow Up Recommendations  SNF     Equipment Recommendations  None recommended by PT    Recommendations for Other Services       Precautions / Restrictions Precautions Precautions: Fall Restrictions Weight Bearing Restrictions: No    Mobility  Bed Mobility Overal bed mobility: Needs Assistance Bed Mobility: Supine to Sit;Sit to Supine Rolling: Min assist Sidelying to sit: Mod assist   Sit to supine: Mod assist;Max assist;HOB elevated   General bed mobility comments: decreased initiation;  Transfers Overall transfer level: Needs assistance Equipment used: Rolling walker (2 wheeled) Transfers: Sit to/from Stand           General transfer comment: sit to stand attempted but aborted 2* lack of pt participation  Ambulation/Gait                 Stairs             Wheelchair Mobility    Modified Rankin (Stroke Patients Only)       Balance Overall balance assessment: Needs assistance Sitting-balance support: No upper extremity supported;Feet supported Sitting balance-Leahy Scale: Fair Sitting balance - Comments: able to maintain static sitting balance                                     Cognition Arousal/Alertness: Awake/alert Behavior During Therapy: Flat affect Overall Cognitive Status: No family/caregiver present to determine baseline cognitive functioning                                 General Comments: Pt with severe self-limiting behaviors;she reports she is unable to move her arms/legs. Pt able to maintain sitting balance without support, while sitting EOB stated I can't do it; Pt assisted to move bil UEs and LEs through range (PT doing 5%) and pt insisting "but you're doing it, I can't"      Exercises      General Comments        Pertinent Vitals/Pain Pain Assessment: Faces Faces Pain Scale: Hurts little more Pain Location: back, arms, legs, feet Pain Descriptors / Indicators: Aching;Sore Pain Intervention(s): Limited activity within patient's tolerance;Monitored during session    Home Living                      Prior Function            PT Goals (current goals can now be found in the care plan section) Acute Rehab PT Goals Patient Stated Goal: pt did not state PT Goal Formulation: With patient Time For Goal Achievement: 10/19/19 Potential to Achieve Goals: Fair Progress towards PT goals:  Not progressing toward goals - comment(decreased initiation)    Frequency    Min 2X/week      PT Plan Current plan remains appropriate    Co-evaluation              AM-PAC PT "6 Clicks" Mobility   Outcome Measure  Help needed turning from your back to your side while in a flat bed without using bedrails?: A Little Help needed moving from lying on your back to sitting on the side of a flat bed without using bedrails?: A Lot Help needed moving to and from a bed to a chair (including a wheelchair)?: A Lot Help needed standing up from a chair using your arms (e.g., wheelchair or bedside chair)?: Total Help needed to walk in hospital room?: Total Help needed climbing 3-5 steps with a railing? : Total 6 Click Score:  10    End of Session Equipment Utilized During Treatment: Gait belt Activity Tolerance: Other (comment)(anxiety) Patient left: in bed;with call bell/phone within reach Nurse Communication: Mobility status PT Visit Diagnosis: Muscle weakness (generalized) (M62.81);Difficulty in walking, not elsewhere classified (R26.2)     Time: 0263-7858 PT Time Calculation (min) (ACUTE ONLY): 20 min  Charges:  $Therapeutic Activity: 8-22 mins                     Debe Coder PT Acute Rehabilitation Services Pager [phone] Office [phone]    Rakim Moone 10/07/2019, 1:05 PM

## 2019-10-07 NOTE — Discharge Planning (Signed)
Clinical Social Work is seeking post-discharge placement for this patient at the following level of care: SNF.

## 2019-10-07 NOTE — ED Notes (Signed)
Bladder Scan - 565 cc

## 2019-10-07 NOTE — Progress Notes (Deleted)
CSW called facility who stated they don't have transportation and recommended a cab provided for by the hospital.  CSW stated that if the facility felt that to be safe for the pt then the hospital would document that this is the facility's decision.  Facility stated they would call administration an call the CSW back.  CSW will continue to follow for D/C needs.  Alphonse Guild. Malacai Grantz, LCSW, LCAS, CSI Transitions of Care Clinical Social Worker Care Coordination Department Ph: [phone]

## 2019-10-07 NOTE — ED Notes (Signed)
Pt refused medication, pt continues to state cant swallow. No s/s of distress. Pt resting comfortably .

## 2019-10-07 NOTE — Progress Notes (Addendum)
CSW met w/pt at the request of EPD. CSw met with pt and ensure pt knew that [facility] CSW was attempting SNF placement and to ask how the pt felt about this option and pt stated she would not be able to paticipate in PT/OT and/or attempt to ambulate or even move if D/C'd to a SNF.  Pt kept stating, "Help me, help me, I can't swallow and I can't eat or drink".  When asked why pt could not drink, pt stated, "because if I drink I'd have to use the bathroom and when I try nothing comes out".  When asked to clarify, pt stated she just could not use the bathroom.  When CSW pointed out that per the previous notes in the ED, when the pt had stated she could not eat to a RN, the RN had cut up the pt's food and prepared her potatoe, the pt had immediately consumed the entire meal.  Pt answered, "I don't remember that, I don't know how I could have done that".  Pt then begged the CSW not to leave and stated, "Don't leave me alone, help me please, please help me", while presenting as tearful.  EDP updated and asked that CSW contact psychiatry.  1:19 PM CSW spoke to psychiatry who receommended EDP place a consult for TTS.  EDP updated/  CSW will continue to follow for D/C needs.  Alphonse Guild. Christophr Calix, LCSW, LCAS, CSI Transitions of Care Clinical Social Worker Care Coordination Department Ph: [phone]

## 2019-10-07 NOTE — ED Notes (Signed)
In out out  -[phone]

## 2019-10-07 NOTE — ED Notes (Signed)
Pt continues to not eat. Pt will drink when it is handed to her and encouraged.

## 2019-10-08 ENCOUNTER — Encounter (HOSPITAL_COMMUNITY): Payer: Self-pay | Admitting: Registered Nurse

## 2019-10-08 DIAGNOSIS — F1320 Sedative, hypnotic or anxiolytic dependence, uncomplicated: Secondary | ICD-10-CM

## 2019-10-08 DIAGNOSIS — F4322 Adjustment disorder with anxiety: Secondary | ICD-10-CM

## 2019-10-08 DIAGNOSIS — F339 Major depressive disorder, recurrent, unspecified: Secondary | ICD-10-CM

## 2019-10-08 DIAGNOSIS — F411 Generalized anxiety disorder: Secondary | ICD-10-CM

## 2019-10-08 LAB — URINALYSIS, ROUTINE W REFLEX MICROSCOPIC
Bilirubin Urine: NEGATIVE
Glucose, UA: NEGATIVE mg/dL
Hgb urine dipstick: NEGATIVE
Ketones, ur: 20 mg/dL — AB
Nitrite: NEGATIVE
Protein, ur: NEGATIVE mg/dL
Specific Gravity, Urine: 1.023 (ref 1.005–1.030)
WBC, UA: >50 WBC/hpf — ABNORMAL HIGH (ref 0–5)
pH: 5 (ref 5.0–8.0)

## 2019-10-08 LAB — CBG MONITORING, ED: Glucose-Capillary: 137 mg/dL — ABNORMAL HIGH (ref 70–99)

## 2019-10-08 MED ORDER — LORAZEPAM 2 MG/ML PO CONC
1.0000 mg | Freq: Once | ORAL | Status: AC
  Administered 2019-10-08: 03:00:00 1 mg via ORAL
  Filled 2019-10-08: qty 1

## 2019-10-08 MED ORDER — CEPHALEXIN 500 MG PO CAPS
500.0000 mg | ORAL_CAPSULE | Freq: Two times a day (BID) | ORAL | Status: DC
  Administered 2019-10-08: 09:00:00 500 mg via ORAL
  Filled 2019-10-08: qty 1

## 2019-10-08 MED ORDER — FLUCONAZOLE 150 MG PO TABS
150.0000 mg | ORAL_TABLET | Freq: Once | ORAL | Status: AC
  Administered 2019-10-08: 150 mg via ORAL
  Filled 2019-10-08: qty 1

## 2019-10-08 MED ORDER — CEPHALEXIN 500 MG PO CAPS: 500.0000 mg | ORAL_CAPSULE | Freq: Two times a day (BID) | ORAL | 0 refills | Status: DC

## 2019-10-08 NOTE — ED Notes (Signed)
Pt sitting in bed eating. Continues to refuse to turn herself in bed or get OOB to use the bathroom

## 2019-10-08 NOTE — Progress Notes (Addendum)
TOC CM spoke to Jeanette Rose and made her aware she was declined by SNF for rehab. CM walked Jeanette Rose in the hall without walker about 60 feet. Jeanette Rose will need a RW and 3n1 bedside commode for home. [place] for DME for home to be delivered to room prior to dc. [name] RN CCM, [place] ED TOC [name] [phone]  10/08/2018 1130 am [facility] has accepted referral. Message sent to ED provider to enter orders in system. Orders missing HHPT. [place], [place] ED TOC CM [phone]

## 2019-10-08 NOTE — Progress Notes (Signed)
CSW continues to find SNF placement for patient. CSW has contacted the following facilities for bed offers, outcome noted.  Waiting for call back [facility] (DON reviewing, per [facility] with admissions) [place] (has no beds)  [place] (no new admissions) [place] (no beds)  [place]  [place], [place] Transitions of Care Department [facility] [phone]

## 2019-10-08 NOTE — ED Notes (Signed)
Case Worker helped pt to get dressed and out to her sister who was waiting in the car to take her home.  She was provided with a walker, which she did not want to use, and a bed-side commode.  Discharge instructions read to pt. Care worker, Estill Batten, pushed her in the wheel chair out to her sister.

## 2019-10-08 NOTE — Consult Note (Signed)
[facility] Psych ED Progress Note  10/08/2019 10:22 AM Jeanette Rose  MRN:  [id number]    Subjective:  Jeanette Rose, 64 y.o., female patient seen via tele psych by this provider, Dr. Dwyane Dee; and chart reviewed on 10/08/19.  On evaluation Jeanette Rose reports that she lives alone and that she is unable to care for herself at home any longer.  Patient states that she does have anxiety.  "  I have anxiety reviewed bed.  I just cannot get up and do anything for myself anymore."  At this time patient denies suicidal/self-harm/homicidal ideations, psychosis, paranoia.  Patient does have a sister who has been trying to assist her in getting into a skilled nursing facility. During evaluation Jeanette Rose is alert/oriented x 4; calm/cooperative; and mood is anxious which is congruent with affect.  She does not appear to be responding to internal/external stimuli or delusional thoughts.  Patient denies suicidal/self-harm/homicidal ideation, psychosis, and paranoia.  Patient answered question appropriately.  Patient worsening anxiety related to unable to care for self at home any longer.  Referral to social work to assist patient and family with skilled nursing placement.   Principal Problem: Adjustment disorder with anxiety Diagnosis:  Principal Problem:   Adjustment disorder with anxiety Active Problems:   Major depressive disorder, recurrent episode (HCC)   Generalized anxiety disorder   Benzodiazepine dependence, continuous ([place])  Total Time spent with patient: 30 minutes  Past Psychiatric History: Generalized anxiety disorder, major depressive disorder, benzodiazepine continuous.  Past Medical History:  Past Medical History:  Diagnosis Date  . Acute acalculous cholecystitis s/p lap chole 04/03/2014 04/02/2014  . Allergy   . Anemia   . Anxiety   . Asthma   . Depression   . Fatty liver    noted on CT per [facility] records  . Fibromyalgia   . GERD (gastroesophageal reflux disease)   . Hyperlipidemia  associated with type 2 diabetes mellitus ([place])   . Hypertension   . Hypothyroid 2015   Transiently in 2015. Synthroid 57mg was stopped 05/07/2016 w/ tsh nml following.  . Obesity (BMI 30-39.9)   . OSA (obstructive sleep apnea)    non-compliant with CPAP  . Seborrheic dermatitis    [facility]  . Stroke ([facility]    TIA  . Type 2 diabetes mellitus ([place]     Past Surgical History:  Procedure Laterality Date  . BREAST REDUCTION SURGERY  06/2008  . CHOLECYSTECTOMY N/A 04/03/2014   Procedure: LAPAROSCOPIC CHOLECYSTECTOMY ;  Surgeon: SAdin Hector MD;  Location: [facility] ORS;  Service: General;  Laterality: N/A;  . REDUCTION MAMMAPLASTY    . TUBAL LIGATION     Family History:  Family History  Problem Relation Age of Onset  . Atrial fibrillation Mother   . Heart disease Father        CAD  . Diabetes Brother   . Cancer Maternal Grandmother   . Diabetes Paternal Grandmother   . Fibromyalgia Sister   . Colitis Sister    Family Psychiatric  History: Denies Social History:  Social History   Substance and Sexual Activity  Alcohol Use No     Social History   Substance and Sexual Activity  Drug Use No    Social History   Socioeconomic History  . Marital status: Divorced    Spouse name: Not on file  . Number of children: Not on file  . Years of education: Not on file  . Highest education level: Not on  file  Occupational History  . Occupation: Disability  Tobacco Use  . Smoking status: Former Smoker    Packs/day: 4.00    Years: 15.00    Pack years: 60.00    Types: Cigarettes    Quit date: 04/22/2004    Years since quitting: 15.4  . Smokeless tobacco: Never Used  . Tobacco comment: 4 pack year hx  Substance and Sexual Activity  . Alcohol use: No  . Drug use: No  . Sexual activity: Never  Other Topics Concern  . Not on file  Social History Narrative   Pt lives alone in   Social Determinants of Health   Financial Resource Strain:    . Difficulty of Paying Living Expenses: Not on file  Food Insecurity:   . Worried About Charity fundraiser in the Last Year: Not on file  . Ran Out of Food in the Last Year: Not on file  Transportation Needs:   . Lack of Transportation (Medical): Not on file  . Lack of Transportation (Non-Medical): Not on file  Physical Activity:   . Days of Exercise per Week: Not on file  . Minutes of Exercise per Session: Not on file  Stress:   . Feeling of Stress : Not on file  Social Connections:   . Frequency of Communication with Friends and Family: Not on file  . Frequency of Social Gatherings with Friends and Family: Not on file  . Attends Religious Services: Not on file  . Active Member of Clubs or Organizations: Not on file  . Attends Archivist Meetings: Not on file  . Marital Status: Not on file    Sleep: Fair  Appetite:  Fair  Current Medications: Current Facility-Administered Medications  Medication Dose Route Frequency Provider Last Rate Last Admin  . aspirin EC tablet 81 mg  81 mg Oral Daily Deno Etienne, DO   81 mg at 10/08/19 0926  . atorvastatin (LIPITOR) tablet 80 mg  80 mg Oral QHS Deno Etienne, DO      . busPIRone (BUSPAR) tablet 30 mg  30 mg Oral BID Deno Etienne, DO   30 mg at 10/08/19 0925  . cephALEXin (KEFLEX) capsule 500 mg  500 mg Oral BID Quintella Reichert, MD   500 mg at 10/08/19 0925  . diazepam (VALIUM) tablet 5-10 mg  5-10 mg Oral TID PRN Deno Etienne, DO   5 mg at 10/06/19 0237  . docusate sodium (COLACE) capsule 100 mg  100 mg Oral BID Deno Etienne, DO   100 mg at 10/06/19 1330  . fluvoxaMINE (LUVOX) tablet 100 mg  100 mg Oral QHS Deno Etienne, DO   100 mg at 10/06/19 0437  . Iloperidone TABS 2 mg  2 mg Oral BID Deno Etienne, DO   2 mg at 10/08/19 8889  . insulin glargine (LANTUS) injection 28 Units  28 Units Subcutaneous q morning - 10a Deno Etienne, DO   28 Units at 10/08/19 0946  . irbesartan (AVAPRO) tablet 300 mg  300 mg Oral Daily Deno Etienne, DO   300 mg at  10/08/19 0947  . metFORMIN (GLUCOPHAGE-XR) 24 hr tablet 1,000 mg  1,000 mg Oral Q breakfast Deno Etienne, DO   1,000 mg at 10/08/19 0945  . pantoprazole (PROTONIX) EC tablet 40 mg  40 mg Oral BID Deno Etienne, DO   40 mg at 10/08/19 1694   Current Outpatient Medications  Medication Sig Dispense Refill  . aspirin 81 MG EC tablet Take 1 tablet (81 mg  total) by mouth daily. Swallow whole. 30 tablet 0  . atorvastatin (LIPITOR) 80 MG tablet Take 1 tablet (80 mg total) by mouth at bedtime. 30 tablet 0  . busPIRone (BUSPAR) 30 MG tablet Take 1 tablet (30 mg total) by mouth 2 (two) times daily. 60 tablet 0  . diazepam (VALIUM) 5 MG tablet Take 5-10 mg by mouth 3 (three) times daily as needed for anxiety.    . docusate sodium (COLACE) 100 MG capsule Take 1 capsule (100 mg total) by mouth 2 (two) times daily. 60 capsule 0  . fluvoxaMINE (LUVOX) 100 MG tablet Take 1 tablet (100 mg total) by mouth at bedtime. 30 tablet 0  . Iloperidone 2 MG TABS Take 1 tablet (2 mg total) by mouth 2 (two) times daily. 60 tablet 0  . insulin glargine (LANTUS) 100 UNIT/ML injection Inject 0.28 mLs (28 Units total) into the skin every morning. 10 mL 0  . irbesartan (AVAPRO) 300 MG tablet Take 1 tablet (300 mg total) by mouth daily. 30 tablet 0  . metFORMIN (FORTAMET) 1000 MG (OSM) 24 hr tablet Take 1 tablet (1,000 mg total) by mouth daily with breakfast. 30 tablet 0  . pantoprazole (PROTONIX) 40 MG tablet Take 1 tablet (40 mg total) by mouth 2 (two) times daily. 60 tablet 0    Lab Results:  Results for orders placed or performed during the hospital encounter of 10/04/19 (from the past 48 hour(s))  Respiratory Panel by RT PCR (Flu A&B, Covid) - Nasopharyngeal Swab     Status: None   Collection Time: 10/06/19  5:11 PM   Specimen: Nasopharyngeal Swab  Result Value Ref Range   SARS Coronavirus 2 by RT PCR NEGATIVE NEGATIVE    Comment: (NOTE) SARS-CoV-2 target nucleic acids are NOT DETECTED. The SARS-CoV-2 RNA is generally  detectable in upper respiratoy specimens during the acute phase of infection. The lowest concentration of SARS-CoV-2 viral copies this assay can detect is 131 copies/mL. A negative result does not preclude SARS-Cov-2 infection and should not be used as the sole basis for treatment or other patient management decisions. A negative result may occur with  improper specimen collection/handling, submission of specimen other than nasopharyngeal swab, presence of viral mutation(s) within the areas targeted by this assay, and inadequate number of viral copies (<131 copies/mL). A negative result must be combined with clinical observations, patient history, and epidemiological information. The expected result is Negative. Fact Sheet for Patients:  PinkCheek.be Fact Sheet for Healthcare Providers:  GravelBags.it This test is not yet ap proved or cleared by the [place] FDA and  has been authorized for detection and/or diagnosis of SARS-CoV-2 by FDA under an Emergency Use Authorization (EUA). This EUA will remain  in effect (meaning this test can be used) for the duration of the COVID-19 declaration under Section 564(b)(1) of the Act, 21 U.S.C. section 360bbb-3(b)(1), unless the authorization is terminated or revoked sooner.    Influenza A by PCR NEGATIVE NEGATIVE   Influenza B by PCR NEGATIVE NEGATIVE    Comment: (NOTE) The Xpert Xpress SARS-CoV-2/FLU/RSV assay is intended as an aid in  the diagnosis of influenza from Nasopharyngeal swab specimens and  should not be used as a sole basis for treatment. Nasal washings and  aspirates are unacceptable for Xpert Xpress SARS-CoV-2/FLU/RSV  testing. Fact Sheet for Patients: PinkCheek.be Fact Sheet for Healthcare Providers: GravelBags.it This test is not yet approved or cleared by the [place] FDA and  has been authorized for  detection and/or diagnosis  of SARS-CoV-2 by  FDA under an Emergency Use Authorization (EUA). This EUA will remain  in effect (meaning this test can be used) for the duration of the  Covid-19 declaration under Section 564(b)(1) of the Act, 21  U.S.C. section 360bbb-3(b)(1), unless the authorization is  terminated or revoked. Performed at [facility], [place] [address], [place], [place] [address]   CBG monitoring, ED     Status: Abnormal   Collection Time: 10/07/19  8:22 AM  Result Value Ref Range   Glucose-Capillary 131 (H) 70 - 99 mg/dL  Urinalysis, Routine w reflex microscopic     Status: Abnormal   Collection Time: 10/08/19  2:59 AM  Result Value Ref Range   Color, Urine YELLOW YELLOW   APPearance CLOUDY (A) CLEAR   Specific Gravity, Urine 1.023 1.005 - 1.030   pH 5.0 5.0 - 8.0   Glucose, UA NEGATIVE NEGATIVE mg/dL   Hgb urine dipstick NEGATIVE NEGATIVE   Bilirubin Urine NEGATIVE NEGATIVE   Ketones, ur 20 (A) NEGATIVE mg/dL   Protein, ur NEGATIVE NEGATIVE mg/dL   Nitrite NEGATIVE NEGATIVE   Leukocytes,Ua MODERATE (A) NEGATIVE   RBC / HPF 0-5 0 - 5 RBC/hpf   WBC, UA >50 (H) 0 - 5 WBC/hpf   Bacteria, UA MANY (A) NONE SEEN   WBC Clumps PRESENT    Budding Yeast PRESENT     Comment: Performed at [facility], [place] [address] [place], [place] [address]  CBG monitoring, ED     Status: Abnormal   Collection Time: 10/08/19  8:29 AM  Result Value Ref Range   Glucose-Capillary 137 (H) 70 - 99 mg/dL    Blood Alcohol level:  Lab Results  Component Value Date   ETH <10 10/04/2019   ETH <10 09/10/2019    Physical Findings: AIMS:  , ,  ,  ,    CIWA:    COWS:     Musculoskeletal: Strength & Muscle Tone: Unable to determine at this time.  Patient is sitting up in bed moving upper extremities with no problem Gait & Station: Did not see patient ambulate Patient leans: N/A  Psychiatric Specialty Exam: Physical Exam  Review of Systems  Blood  pressure 114/78, pulse 88, temperature 98 F (36.7 C), temperature source Oral, resp. rate 17, SpO2 99 %.There is no height or weight on file to calculate BMI.  General Appearance: Casual  Eye Contact:  Good  Speech:  Clear and Coherent and Normal Rate  Volume:  Normal  Mood:  Anxious  Affect:  Congruent and Anxious  Thought Process:  Coherent, Goal Directed and Descriptions of Associations: Intact  Orientation:  Full (Time, Place, and Person)  Thought Content:  WDL  Suicidal Thoughts:  No  Homicidal Thoughts:  No  Memory:  Immediate;   Fair Recent;   Fair  Judgement:  Fair  Insight:  Fair and Present  Psychomotor Activity:  Normal  Concentration:  Concentration: Fair  Recall:  [employer] of Knowledge:  Fair  Language:  Good  Akathisia:  No  Handed:  Right  AIMS (if indicated):     Assets:  Communication Skills Desire for Improvement Social Support  ADL's:  Intact  Cognition:  WNL  Sleep:         Treatment Plan Summary: Plan Referral to social work for assistance with SNF placement or home health assistance  Disposition: Patient psychiatrically cleared and referred to social work No evidence of imminent risk to self or others  at present.   Patient does not meet criteria for psychiatric inpatient admission. Supportive therapy provided about ongoing stressors. Discussed crisis plan, support from social network, calling 911, coming to the Emergency Department, and calling Suicide Hotline.  Spoke with Dr. Ralene Bathe informed of disposition and recommendation: that patient has been psychiatrically cleared and a referral for social work has been ordered.  Patient worsening anxiety possibly related to patient unable to care for self at home anymore and that family has been trying to get patient placed in a skilled nursing facility.    Damilola Flamm, NP 10/08/2019, 10:22 AM

## 2019-10-08 NOTE — Progress Notes (Signed)
TOC CM/CSW spoke to pt at bedside. States she cannot go home and prefers to go to SNF rehab. Explained CSW is working on securing a SNF rehab. [place] Supervisor to discuss and to update on no current offers for SNF. Continue to contact SNF for rehab. [place], [place] ED TOC CM [phone]

## 2019-10-08 NOTE — ED Notes (Signed)
In and out cath 632m

## 2019-10-08 NOTE — ED Notes (Signed)
Patient still refuses meds but is able to swallow and drink water.

## 2019-10-08 NOTE — ED Provider Notes (Signed)
Patient with history of anxiety, has been cleared by psychiatry for outpatient follow-up. On bedside assessment she complains of feeling very anxious as well as generalized weakness. She is able to move all four extremities without difficulty on evaluation. She has been able to drink during her ED stay. Social work consulted for assistance in placement due to her deconditioning and severe anxiety.  UA c/w UTI - will start on abx and send culture     Quintella Reichert, MD 10/08/19 1600

## 2019-10-08 NOTE — ED Notes (Signed)
Bladder scan over 600

## 2019-10-08 NOTE — BH Assessment (Signed)
Jeanette Rose Assessment Progress Note  Per [name], FNP, this pt continues not to require psychiatric hospitalization at this time.  Pt is psychiatrically cleared.  Discharge instructions include referral information for several area psychiatry providers.  Pt's nurse, [name], has been notified.  [name], [name] Triage Specialist [phone]

## 2019-10-08 NOTE — Discharge Instructions (Addendum)
For your behavioral health needs, you are advised to follow up with an outpatient psychiatrist.  Contact one of the following providers to ask about scheduling an intake appointment:       [facility] [place], [place] [address]      [phone]       [facility]      [address] [place], [place] [address]      [phone]       Leanord Hawking, MD      [facility]      [address] [place], [place] [address]      [phone]

## 2019-10-09 ENCOUNTER — Telehealth (HOSPITAL_COMMUNITY): Payer: Self-pay | Admitting: Emergency Medicine

## 2019-10-09 NOTE — Telephone Encounter (Signed)
Home health face-to-face order was entered in error.

## 2019-10-09 NOTE — Progress Notes (Signed)
HH orders updated, notified [facility]. [place], [place] ED TOC CM [phone]

## 2019-10-11 ENCOUNTER — Other Ambulatory Visit: Payer: Self-pay | Admitting: *Deleted

## 2019-10-11 ENCOUNTER — Encounter: Payer: Self-pay | Admitting: *Deleted

## 2019-10-11 LAB — URINE CULTURE: Culture: >=100000 — AB

## 2019-10-11 NOTE — Patient Outreach (Signed)
[facility]) Care Management [place] Telephone Outreach, ED referral Unsuccessful outreach attempt # 1- new patient  10/11/2019  Jeanette Rose 13-Jan-1956 [id number]  Unsuccessful telephone outreach to Jeanette Rose, 64 y/o female referred to [place] by ED RN for multiple ED visits; patient was last evaluated at [facility] ED on October 04, 2019 for anxiety with incidental finding of UTI; patient had psychiatric evaluation during ED visit and was cleared for discharge home to self-care with outpatient psychiatric follow up recommended.  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  With call attempt today, received automated outgoing voice message stating that patient's voice mail box is full; unable to leave HIPAA compliant voice message requesting call back  Plan:  Will place [facility] CM unsuccessful patient outreach letter in mail requesting call back in writing  Will re-attempt [place] telephone outreach within 4 business days if I do not hear back from patient first  Oneta Rack, RN, BSN, [employer] [facility] Care Management  [phone]

## 2019-10-12 ENCOUNTER — Telehealth: Payer: Self-pay | Admitting: *Deleted

## 2019-10-12 ENCOUNTER — Encounter: Payer: Self-pay | Admitting: Family Medicine

## 2019-10-12 NOTE — Telephone Encounter (Signed)
Post ED Visit - Positive Culture Follow-up: Unsuccessful Patient Follow-up  Culture assessed and recommendations reviewed by:  []  Elenor Quinones, Pharm.D. []  Heide Guile, Pharm.D., BCPS AQ-ID []  Parks Neptune, Pharm.D., BCPS []  Alycia Rossetti, Pharm.D., BCPS []  [place], Pharm.D., BCPS, AAHIVP []  Legrand Como, Pharm.D., BCPS, AAHIVP []  Wynell Balloon, PharmD []  Vincenza Hews, PharmD, BCPS  Positive urine culture  []  Patient discharged without antimicrobial prescription and treatment is now indicated []  Organism is resistant to prescribed ED discharge antimicrobial []  Patient with positive blood cultures  Plan:  Start Ampicillin 561m PO q 8 hrs x 7 days  Unable to contact patient after 3 attempts, letter will be sent to address on file  RArdeen Fillers1/15/2021, 12:43 PM

## 2019-10-12 NOTE — Progress Notes (Signed)
ED Antimicrobial Stewardship Positive Culture Follow Up   Jeanette Rose is an 64 y.o. female who presented to [facility] on 10/04/2019 with a chief complaint of  Chief Complaint  Patient presents with  . Anxiety    Recent Results (from the past 720 hour(s))  Respiratory Panel by RT PCR (Flu A&B, Covid) - Nasopharyngeal Swab     Status: None   Collection Time: 10/04/19 11:25 PM   Specimen: Nasopharyngeal Swab  Result Value Ref Range Status   SARS Coronavirus 2 by RT PCR NEGATIVE NEGATIVE Final    Comment: (NOTE) SARS-CoV-2 target nucleic acids are NOT DETECTED. The SARS-CoV-2 RNA is generally detectable in upper respiratoy specimens during the acute phase of infection. The lowest concentration of SARS-CoV-2 viral copies this assay can detect is 131 copies/mL. A negative result does not preclude SARS-Cov-2 infection and should not be used as the sole basis for treatment or other patient management decisions. A negative result may occur with  improper specimen collection/handling, submission of specimen other than nasopharyngeal swab, presence of viral mutation(s) within the areas targeted by this assay, and inadequate number of viral copies (<131 copies/mL). A negative result must be combined with clinical observations, patient history, and epidemiological information. The expected result is Negative. Fact Sheet for Patients:  PinkCheek.be Fact Sheet for Healthcare Providers:  GravelBags.it This test is not yet ap proved or cleared by the [place] FDA and  has been authorized for detection and/or diagnosis of SARS-CoV-2 by FDA under an Emergency Use Authorization (EUA). This EUA will remain  in effect (meaning this test can be used) for the duration of the COVID-19 declaration under Section 564(b)(1) of the Act, 21 U.S.C. section 360bbb-3(b)(1), unless the authorization is terminated or revoked sooner.    Influenza A by  PCR NEGATIVE NEGATIVE Final   Influenza B by PCR NEGATIVE NEGATIVE Final    Comment: (NOTE) The Xpert Xpress SARS-CoV-2/FLU/RSV assay is intended as an aid in  the diagnosis of influenza from Nasopharyngeal swab specimens and  should not be used as a sole basis for treatment. Nasal washings and  aspirates are unacceptable for Xpert Xpress SARS-CoV-2/FLU/RSV  testing. Fact Sheet for Patients: PinkCheek.be Fact Sheet for Healthcare Providers: GravelBags.it This test is not yet approved or cleared by the [place] FDA and  has been authorized for detection and/or diagnosis of SARS-CoV-2 by  FDA under an Emergency Use Authorization (EUA). This EUA will remain  in effect (meaning this test can be used) for the duration of the  Covid-19 declaration under Section 564(b)(1) of the Act, 21  U.S.C. section 360bbb-3(b)(1), unless the authorization is  terminated or revoked. Performed at [facility], [place] [address] [place], [place] [address]   Respiratory Panel by RT PCR (Flu A&B, Covid) - Nasopharyngeal Swab     Status: None   Collection Time: 10/06/19  5:11 PM   Specimen: Nasopharyngeal Swab  Result Value Ref Range Status   SARS Coronavirus 2 by RT PCR NEGATIVE NEGATIVE Final    Comment: (NOTE) SARS-CoV-2 target nucleic acids are NOT DETECTED. The SARS-CoV-2 RNA is generally detectable in upper respiratoy specimens during the acute phase of infection. The lowest concentration of SARS-CoV-2 viral copies this assay can detect is 131 copies/mL. A negative result does not preclude SARS-Cov-2 infection and should not be used as the sole basis for treatment or other patient management decisions. A negative result may occur with  improper specimen collection/handling, submission of specimen other than nasopharyngeal swab, presence  of viral mutation(s) within the areas targeted by this assay, and inadequate number of  viral copies (<131 copies/mL). A negative result must be combined with clinical observations, patient history, and epidemiological information. The expected result is Negative. Fact Sheet for Patients:  PinkCheek.be Fact Sheet for Healthcare Providers:  GravelBags.it This test is not yet ap proved or cleared by the [place] FDA and  has been authorized for detection and/or diagnosis of SARS-CoV-2 by FDA under an Emergency Use Authorization (EUA). This EUA will remain  in effect (meaning this test can be used) for the duration of the COVID-19 declaration under Section 564(b)(1) of the Act, 21 U.S.C. section 360bbb-3(b)(1), unless the authorization is terminated or revoked sooner.    Influenza A by PCR NEGATIVE NEGATIVE Final   Influenza B by PCR NEGATIVE NEGATIVE Final    Comment: (NOTE) The Xpert Xpress SARS-CoV-2/FLU/RSV assay is intended as an aid in  the diagnosis of influenza from Nasopharyngeal swab specimens and  should not be used as a sole basis for treatment. Nasal washings and  aspirates are unacceptable for Xpert Xpress SARS-CoV-2/FLU/RSV  testing. Fact Sheet for Patients: PinkCheek.be Fact Sheet for Healthcare Providers: GravelBags.it This test is not yet approved or cleared by the [place] FDA and  has been authorized for detection and/or diagnosis of SARS-CoV-2 by  FDA under an Emergency Use Authorization (EUA). This EUA will remain  in effect (meaning this test can be used) for the duration of the  Covid-19 declaration under Section 564(b)(1) of the Act, 21  U.S.C. section 360bbb-3(b)(1), unless the authorization is  terminated or revoked. Performed at [facility], [place] [address] [place], [place] [address]   Urine culture     Status: Abnormal   Collection Time: 10/08/19 11:00 AM   Specimen: Urine, Catheterized  Result  Value Ref Range Status   Specimen Description   Final    URINE, CATHETERIZED Performed at [facility] [address] [place], [place] [address]    Special Requests   Final    NONE Performed at [facility], [place] [address], [place], [place] [address]    Culture (A)  Final    >=100,000 COLONIES/mL LACTOBACILLUS SPECIES Standardized susceptibility testing for this organism is not available. Performed at [facility], [place] [address], [place], [place] [address]    Report Status 10/11/2019 FINAL  Final    [x]  Treated with Keflex, organism resistant to prescribed antimicrobial []  Patient discharged originally without antimicrobial agent and treatment is now indicated  New antibiotic prescription: Apicillin 553m q8 x 7 days  ED Provider: HDelia HeadyPA   GMinda Ditto1/15/2021, 4:15 PM Clinical Pharmacist [phone]

## 2019-10-13 ENCOUNTER — Emergency Department (HOSPITAL_COMMUNITY): Payer: Medicare Other

## 2019-10-13 ENCOUNTER — Inpatient Hospital Stay (HOSPITAL_COMMUNITY)
Admission: EM | Admit: 2019-10-13 | Discharge: 2019-10-18 | DRG: 871 | Disposition: A | Discharge status: Skilled Nursing Facility | Payer: Medicare Other | Attending: Internal Medicine | Admitting: Internal Medicine

## 2019-10-13 DIAGNOSIS — Z833 Family history of diabetes mellitus: Secondary | ICD-10-CM

## 2019-10-13 DIAGNOSIS — R5381 Other malaise: Secondary | ICD-10-CM | POA: Diagnosis not present

## 2019-10-13 DIAGNOSIS — K760 Fatty (change of) liver, not elsewhere classified: Secondary | ICD-10-CM | POA: Diagnosis present

## 2019-10-13 DIAGNOSIS — Z888 Allergy status to other drugs, medicaments and biological substances status: Secondary | ICD-10-CM

## 2019-10-13 DIAGNOSIS — R531 Weakness: Secondary | ICD-10-CM | POA: Diagnosis not present

## 2019-10-13 DIAGNOSIS — E860 Dehydration: Secondary | ICD-10-CM | POA: Diagnosis not present

## 2019-10-13 DIAGNOSIS — E785 Hyperlipidemia, unspecified: Secondary | ICD-10-CM | POA: Diagnosis present

## 2019-10-13 DIAGNOSIS — F331 Major depressive disorder, recurrent, moderate: Secondary | ICD-10-CM | POA: Diagnosis present

## 2019-10-13 DIAGNOSIS — K219 Gastro-esophageal reflux disease without esophagitis: Secondary | ICD-10-CM | POA: Diagnosis not present

## 2019-10-13 DIAGNOSIS — E1142 Type 2 diabetes mellitus with diabetic polyneuropathy: Secondary | ICD-10-CM | POA: Diagnosis not present

## 2019-10-13 DIAGNOSIS — M6282 Rhabdomyolysis: Secondary | ICD-10-CM

## 2019-10-13 DIAGNOSIS — F332 Major depressive disorder, recurrent severe without psychotic features: Secondary | ICD-10-CM | POA: Diagnosis present

## 2019-10-13 DIAGNOSIS — G4733 Obstructive sleep apnea (adult) (pediatric): Secondary | ICD-10-CM | POA: Diagnosis not present

## 2019-10-13 DIAGNOSIS — Z794 Long term (current) use of insulin: Secondary | ICD-10-CM | POA: Diagnosis not present

## 2019-10-13 DIAGNOSIS — F1320 Sedative, hypnotic or anxiolytic dependence, uncomplicated: Secondary | ICD-10-CM | POA: Diagnosis present

## 2019-10-13 DIAGNOSIS — B9689 Other specified bacterial agents as the cause of diseases classified elsewhere: Secondary | ICD-10-CM | POA: Diagnosis not present

## 2019-10-13 DIAGNOSIS — Z20822 Contact with and (suspected) exposure to covid-19: Secondary | ICD-10-CM | POA: Diagnosis present

## 2019-10-13 DIAGNOSIS — G9341 Metabolic encephalopathy: Secondary | ICD-10-CM | POA: Diagnosis present

## 2019-10-13 DIAGNOSIS — R748 Abnormal levels of other serum enzymes: Secondary | ICD-10-CM | POA: Diagnosis not present

## 2019-10-13 DIAGNOSIS — A419 Sepsis, unspecified organism: Secondary | ICD-10-CM | POA: Diagnosis not present

## 2019-10-13 DIAGNOSIS — Z87891 Personal history of nicotine dependence: Secondary | ICD-10-CM

## 2019-10-13 DIAGNOSIS — E039 Hypothyroidism, unspecified: Secondary | ICD-10-CM | POA: Diagnosis not present

## 2019-10-13 DIAGNOSIS — E876 Hypokalemia: Secondary | ICD-10-CM | POA: Diagnosis present

## 2019-10-13 DIAGNOSIS — Z8673 Personal history of transient ischemic attack (TIA), and cerebral infarction without residual deficits: Secondary | ICD-10-CM

## 2019-10-13 DIAGNOSIS — M6281 Muscle weakness (generalized): Secondary | ICD-10-CM | POA: Diagnosis not present

## 2019-10-13 DIAGNOSIS — Z8249 Family history of ischemic heart disease and other diseases of the circulatory system: Secondary | ICD-10-CM | POA: Diagnosis not present

## 2019-10-13 DIAGNOSIS — R000 Tachycardia, unspecified: Secondary | ICD-10-CM | POA: Diagnosis not present

## 2019-10-13 DIAGNOSIS — R404 Transient alteration of awareness: Secondary | ICD-10-CM | POA: Diagnosis not present

## 2019-10-13 DIAGNOSIS — E559 Vitamin D deficiency, unspecified: Secondary | ICD-10-CM | POA: Diagnosis present

## 2019-10-13 DIAGNOSIS — N390 Urinary tract infection, site not specified: Secondary | ICD-10-CM | POA: Diagnosis not present

## 2019-10-13 DIAGNOSIS — I10 Essential (primary) hypertension: Secondary | ICD-10-CM | POA: Diagnosis present

## 2019-10-13 DIAGNOSIS — Z7401 Bed confinement status: Secondary | ICD-10-CM | POA: Diagnosis not present

## 2019-10-13 DIAGNOSIS — M797 Fibromyalgia: Secondary | ICD-10-CM | POA: Diagnosis not present

## 2019-10-13 DIAGNOSIS — F4323 Adjustment disorder with mixed anxiety and depressed mood: Secondary | ICD-10-CM | POA: Diagnosis present

## 2019-10-13 DIAGNOSIS — M2550 Pain in unspecified joint: Secondary | ICD-10-CM | POA: Diagnosis not present

## 2019-10-13 DIAGNOSIS — E1165 Type 2 diabetes mellitus with hyperglycemia: Secondary | ICD-10-CM | POA: Diagnosis not present

## 2019-10-13 DIAGNOSIS — R0689 Other abnormalities of breathing: Secondary | ICD-10-CM | POA: Diagnosis not present

## 2019-10-13 DIAGNOSIS — L89152 Pressure ulcer of sacral region, stage 2: Secondary | ICD-10-CM | POA: Diagnosis not present

## 2019-10-13 DIAGNOSIS — F411 Generalized anxiety disorder: Secondary | ICD-10-CM | POA: Diagnosis not present

## 2019-10-13 DIAGNOSIS — Z743 Need for continuous supervision: Secondary | ICD-10-CM | POA: Diagnosis not present

## 2019-10-13 DIAGNOSIS — Z9049 Acquired absence of other specified parts of digestive tract: Secondary | ICD-10-CM

## 2019-10-13 DIAGNOSIS — Z7982 Long term (current) use of aspirin: Secondary | ICD-10-CM

## 2019-10-13 DIAGNOSIS — Z79899 Other long term (current) drug therapy: Secondary | ICD-10-CM

## 2019-10-13 DIAGNOSIS — R410 Disorientation, unspecified: Secondary | ICD-10-CM | POA: Diagnosis not present

## 2019-10-13 LAB — CBC
HCT: 39.5 % (ref 36.0–46.0)
Hemoglobin: 12.5 g/dL (ref 12.0–15.0)
MCH: 29.1 pg (ref 26.0–34.0)
MCHC: 31.6 g/dL (ref 30.0–36.0)
MCV: 91.9 fL (ref 80.0–100.0)
Platelets: 195 10*3/uL (ref 150–400)
RBC: 4.3 MIL/uL (ref 3.87–5.11)
RDW: 15.1 % (ref 11.5–15.5)
WBC: 14.3 10*3/uL — ABNORMAL HIGH (ref 4.0–10.5)
nRBC: 0 % (ref 0.0–0.2)

## 2019-10-13 LAB — I-STAT CHEM 8, ED
BUN: 36 mg/dL — ABNORMAL HIGH (ref 8–23)
BUN: 36 mg/dL — ABNORMAL HIGH (ref 8–23)
Calcium, Ion: 1.33 mmol/L (ref 1.15–1.40)
Calcium, Ion: 1.34 mmol/L (ref 1.15–1.40)
Chloride: 108 mmol/L (ref 98–111)
Chloride: 109 mmol/L (ref 98–111)
Creatinine, Ser: 0.7 mg/dL (ref 0.44–1.00)
Creatinine, Ser: 0.8 mg/dL (ref 0.44–1.00)
Glucose, Bld: 227 mg/dL — ABNORMAL HIGH (ref 70–99)
Glucose, Bld: 227 mg/dL — ABNORMAL HIGH (ref 70–99)
HCT: 41 % (ref 36.0–46.0)
HCT: 41 % (ref 36.0–46.0)
Hemoglobin: 13.9 g/dL (ref 12.0–15.0)
Hemoglobin: 13.9 g/dL (ref 12.0–15.0)
Potassium: 4.4 mmol/L (ref 3.5–5.1)
Potassium: 4.4 mmol/L (ref 3.5–5.1)
Sodium: 142 mmol/L (ref 135–145)
Sodium: 143 mmol/L (ref 135–145)
TCO2: 25 mmol/L (ref 22–32)
TCO2: 27 mmol/L (ref 22–32)

## 2019-10-13 LAB — CREATININE, SERUM
Creatinine, Ser: 0.55 mg/dL (ref 0.44–1.00)
GFR calc Af Amer: >60 mL/min (ref >60)
GFR calc non Af Amer: >60 mL/min (ref >60)

## 2019-10-13 LAB — CBC WITH DIFFERENTIAL/PLATELET
Abs Immature Granulocytes: 0.06 10*3/uL (ref 0.00–0.07)
Basophils Absolute: 0 10*3/uL (ref 0.0–0.1)
Basophils Relative: 0 %
Eosinophils Absolute: 0.1 10*3/uL (ref 0.0–0.5)
Eosinophils Relative: 0 %
HCT: 42.8 % (ref 36.0–46.0)
Hemoglobin: 13.6 g/dL (ref 12.0–15.0)
Immature Granulocytes: 0 %
Lymphocytes Relative: 9 %
Lymphs Abs: 1.3 10*3/uL (ref 0.7–4.0)
MCH: 29.2 pg (ref 26.0–34.0)
MCHC: 31.8 g/dL (ref 30.0–36.0)
MCV: 92 fL (ref 80.0–100.0)
Monocytes Absolute: 0.8 10*3/uL (ref 0.1–1.0)
Monocytes Relative: 6 %
Neutro Abs: 12 10*3/uL — ABNORMAL HIGH (ref 1.7–7.7)
Neutrophils Relative %: 85 %
Platelets: 203 10*3/uL (ref 150–400)
RBC: 4.65 MIL/uL (ref 3.87–5.11)
RDW: 15 % (ref 11.5–15.5)
WBC: 14.3 10*3/uL — ABNORMAL HIGH (ref 4.0–10.5)
nRBC: 0 % (ref 0.0–0.2)

## 2019-10-13 LAB — COMPREHENSIVE METABOLIC PANEL
ALT: 41 U/L (ref 0–44)
AST: 68 U/L — ABNORMAL HIGH (ref 15–41)
Albumin: 4.1 g/dL (ref 3.5–5.0)
Alkaline Phosphatase: 86 U/L (ref 38–126)
Anion gap: 10 (ref 5–15)
BUN: 38 mg/dL — ABNORMAL HIGH (ref 8–23)
CO2: 26 mmol/L (ref 22–32)
Calcium: 10.1 mg/dL (ref 8.9–10.3)
Chloride: 106 mmol/L (ref 98–111)
Creatinine, Ser: 0.76 mg/dL (ref 0.44–1.00)
GFR calc Af Amer: >60 mL/min (ref >60)
GFR calc non Af Amer: >60 mL/min (ref >60)
Glucose, Bld: 227 mg/dL — ABNORMAL HIGH (ref 70–99)
Potassium: 4.4 mmol/L (ref 3.5–5.1)
Sodium: 142 mmol/L (ref 135–145)
Total Bilirubin: 1.4 mg/dL — ABNORMAL HIGH (ref 0.3–1.2)
Total Protein: 8.1 g/dL (ref 6.5–8.1)

## 2019-10-13 LAB — CBG MONITORING, ED
Glucose-Capillary: 192 mg/dL — ABNORMAL HIGH (ref 70–99)
Glucose-Capillary: 162 mg/dL — ABNORMAL HIGH (ref 70–99)

## 2019-10-13 LAB — POC SARS CORONAVIRUS 2 AG -  ED: SARS Coronavirus 2 Ag: NEGATIVE

## 2019-10-13 LAB — ETHANOL: Alcohol, Ethyl (B): <10 mg/dL (ref <10)

## 2019-10-13 LAB — SALICYLATE LEVEL: Salicylate Lvl: <7 mg/dL — ABNORMAL LOW (ref 7.0–30.0)

## 2019-10-13 LAB — ACETAMINOPHEN LEVEL: Acetaminophen (Tylenol), Serum: <10 ug/mL — ABNORMAL LOW (ref 10–30)

## 2019-10-13 LAB — CK: Total CK: 2157 U/L — ABNORMAL HIGH (ref 38–234)

## 2019-10-13 LAB — LACTIC ACID, PLASMA: Lactic Acid, Venous: 1.7 mmol/L (ref 0.5–1.9)

## 2019-10-13 MED ORDER — ACETAMINOPHEN 325 MG PO TABS: 650.0000 mg | ORAL_TABLET | Freq: Four times a day (QID) | ORAL | Status: DC | PRN

## 2019-10-13 MED ORDER — ACETAMINOPHEN 650 MG RE SUPP: 650.0000 mg | Freq: Four times a day (QID) | RECTAL | Status: DC | PRN

## 2019-10-13 MED ORDER — ONDANSETRON HCL 4 MG/2ML IJ SOLN: 4.0000 mg | Freq: Four times a day (QID) | INTRAMUSCULAR | Status: DC | PRN

## 2019-10-13 MED ORDER — SODIUM CHLORIDE 0.9 % IV BOLUS
1000.0000 mL | Freq: Once | INTRAVENOUS | Status: AC
  Administered 2019-10-13: 1000 mL via INTRAVENOUS

## 2019-10-13 MED ORDER — SODIUM CHLORIDE 0.9 % IV SOLN
1.0000 g | INTRAVENOUS | Status: DC
  Administered 2019-10-13 – 2019-10-17 (×5): 1 g via INTRAVENOUS
  Filled 2019-10-13 (×2): qty 10
  Filled 2019-10-13 (×2): qty 1
  Filled 2019-10-13: qty 10
  Filled 2019-10-13: qty 1

## 2019-10-13 MED ORDER — ONDANSETRON HCL 4 MG PO TABS: 4.0000 mg | ORAL_TABLET | Freq: Four times a day (QID) | ORAL | Status: DC | PRN

## 2019-10-13 MED ORDER — ENOXAPARIN SODIUM 40 MG/0.4ML ~~LOC~~ SOLN
40.0000 mg | SUBCUTANEOUS | Status: DC
  Administered 2019-10-13 – 2019-10-17 (×5): 40 mg via SUBCUTANEOUS
  Filled 2019-10-13 (×5): qty 0.4

## 2019-10-13 MED ORDER — SODIUM CHLORIDE 0.9 % IV SOLN: INTRAVENOUS | Status: DC

## 2019-10-13 MED ORDER — INSULIN ASPART 100 UNIT/ML ~~LOC~~ SOLN
0.0000 [IU] | Freq: Every day | SUBCUTANEOUS | Status: DC
  Filled 2019-10-13: qty 0.05

## 2019-10-13 NOTE — ED Provider Notes (Signed)
[facility] DEPT Provider Note   CSN: [id number] Arrival date & time: 10/13/19  1529     History Chief Complaint  Patient presents with  . Altered Mental Status    Lasheena Frieze is a 64 y.o. female hx of stroke, anemia, GERD, HTN, DM, anxiety, depression, here presenting with altered mental status, confusion.  Patient was seen in the ED about 5 days ago for anxiety and depression.  Patient eventually was sent home. She was also diagnosed with urinary tract infection but has not been taking her antibiotic.  I talked to the sister, who has been checking in on her.  Patient apparently refused to see her family 3 days ago when they came to visit.  Home health was contacted and eventually came to see her today.  Patient was psychotic apparently and has been in bed and refusing to eat and is been covered in urine.  Sister states that home health called EMS due to concern for her living situation and her psychosis. Patient apparently has not moved for the last 5 days has been in bed.  Patient states that she has very depressed and anxious.  Has not been able to get out of bed since her discharge.  Denies any new falls or injuries.  The history is provided by the patient, a relative and the EMS personnel.   Level V caveat- AMS     Past Medical History:  Diagnosis Date  . Acute acalculous cholecystitis s/p lap chole 04/03/2014 04/02/2014  . Allergy   . Anemia   . Anxiety   . Asthma   . Depression   . Fatty liver    noted on CT per [facility] records  . Fibromyalgia   . GERD (gastroesophageal reflux disease)   . Hyperlipidemia associated with type 2 diabetes mellitus ([place])   . Hypertension   . Hypothyroid 2015   Transiently in 2015. Synthroid 62mg was stopped 05/07/2016 w/ tsh nml following.  . Obesity (BMI 30-39.9)   . OSA (obstructive sleep apnea)    non-compliant with CPAP  . Seborrheic dermatitis    [facility] face and scalp[facility]  . Stroke  ([facility]    TIA  . Type 2 diabetes mellitus ([facility]     Patient Active Problem List   Diagnosis Date Noted  . Adjustment disorder with anxiety 10/08/2019  . Persistent depressive disorder with anxious distress, currently severe 09/16/2019  . MDD (major depressive disorder), recurrent severe, without psychosis ([place] 09/11/2019  . Major depression 03/15/2019  . Severe recurrent major depression without psychotic features ([place] 03/15/2019  . Generalized anxiety disorder 03/04/2019  . Benzodiazepine dependence, continuous ([place] 03/04/2019  . MDD (major depressive disorder), recurrent episode, severe ([place] 03/04/2019  . Positive colorectal cancer screening using Cologuard test 02/15/2018  . Seborrheic dermatitis   . History of hypothyroidism 10/26/2017  . Elevated ferritin level 10/26/2017  . Vitamin D deficiency 10/26/2017  . Diabetic peripheral neuropathy associated with type 2 diabetes mellitus ([place] 10/26/2017  . OSA (obstructive sleep apnea)   . Hyperlipidemia LDL goal <100   . Major depressive disorder, recurrent episode ([place]   . Constipation 04/22/2014  . Steatohepatitis 04/03/2014  . Anxiety   . Type 2 diabetes mellitus with diabetic polyneuropathy, with long-term current use of insulin ([place]   . GERD (gastroesophageal reflux disease)   . Hypertension   . Obesity (BMI 30-39.9)     Past Surgical History:  Procedure Laterality Date  . BREAST REDUCTION SURGERY  06/2008  .  CHOLECYSTECTOMY N/A 04/03/2014   Procedure: LAPAROSCOPIC CHOLECYSTECTOMY ;  Surgeon: Adin Hector, MD;  Location: [facility] ORS;  Service: General;  Laterality: N/A;  . REDUCTION MAMMAPLASTY    . TUBAL LIGATION       OB History   No obstetric history on file.     Family History  Problem Relation Age of Onset  . Atrial fibrillation Mother   . Heart disease Father        CAD  . Diabetes Brother   . Cancer Maternal Grandmother   . Diabetes Paternal Grandmother   . Fibromyalgia Sister   . Colitis Sister      Social History   Tobacco Use  . Smoking status: Former Smoker    Packs/day: 4.00    Years: 15.00    Pack years: 60.00    Types: Cigarettes    Quit date: 04/22/2004    Years since quitting: 15.4  . Smokeless tobacco: Never Used  . Tobacco comment: 4 pack year hx  Substance Use Topics  . Alcohol use: No  . Drug use: No    Home Medications Prior to Admission medications   Medication Sig Start Date End Date Taking? Authorizing Provider  aspirin 81 MG EC tablet Take 1 tablet (81 mg total) by mouth daily. Swallow whole. 09/27/19  Yes Money, Lowry Ram, FNP  atorvastatin (LIPITOR) 80 MG tablet Take 1 tablet (80 mg total) by mouth at bedtime. 09/27/19  Yes Money, Lowry Ram, FNP  busPIRone (BUSPAR) 30 MG tablet Take 1 tablet (30 mg total) by mouth 2 (two) times daily. 09/29/19 09/28/20 Yes Sharma Covert, MD  diazepam (VALIUM) 5 MG tablet Take 5-10 mg by mouth 3 (three) times daily as needed for anxiety.   Yes [provider]  docusate sodium (COLACE) 100 MG capsule Take 1 capsule (100 mg total) by mouth 2 (two) times daily. Patient taking differently: Take 100 mg by mouth daily as needed for mild constipation or moderate constipation.  09/27/19  Yes Money, Lowry Ram, FNP  fluvoxaMINE (LUVOX) 100 MG tablet Take 1 tablet (100 mg total) by mouth at bedtime. 09/27/19  Yes Money, Lowry Ram, FNP  irbesartan (AVAPRO) 300 MG tablet Take 1 tablet (300 mg total) by mouth daily. 09/27/19  Yes Money, Lowry Ram, FNP  metFORMIN (GLUCOPHAGE-XR) 500 MG 24 hr tablet Take 1,000 mg by mouth daily with breakfast. 09/27/19  Yes [provider]  pantoprazole (PROTONIX) 40 MG tablet Take 1 tablet (40 mg total) by mouth 2 (two) times daily. 09/27/19  Yes Money, Lowry Ram, FNP  cephALEXin (KEFLEX) 500 MG capsule Take 1 capsule (500 mg total) by mouth 2 (two) times daily for 7 days. 10/08/19 10/15/19  Gareth Serrao, MD  Iloperidone 2 MG TABS Take 1 tablet (2 mg total) by mouth 2 (two) times daily.  09/27/19   Money, Lowry Ram, FNP  insulin glargine (LANTUS) 100 UNIT/ML injection Inject 0.28 mLs (28 Units total) into the skin every morning. 09/27/19   Money, Lowry Ram, FNP    Allergies    Wilder Glade [dapagliflozin], Rexulti [brexpiprazole], and Trintellix [vortioxetine]  Review of Systems   Review of Systems  Neurological: Positive for weakness.  Psychiatric/Behavioral: The patient is nervous/anxious.   All other systems reviewed and are negative.   Physical Exam Updated Vital Signs BP 121/78   Pulse 65   Temp 98.9 F (37.2 C) (Rectal)   Resp (!) 21   SpO2 96%   Physical Exam Vitals and nursing note reviewed.  Constitutional:  Comments: Tired, tearful   HENT:     Head: Normocephalic.     Nose: Nose normal.     Mouth/Throat:     Mouth: Mucous membranes are moist.  Eyes:     Extraocular Movements: Extraocular movements intact.     Pupils: Pupils are equal, round, and reactive to light.  Cardiovascular:     Rate and Rhythm: Normal rate and regular rhythm.     Pulses: Normal pulses.     Heart sounds: Normal heart sounds.  Pulmonary:     Effort: Pulmonary effort is normal.     Breath sounds: Normal breath sounds.  Abdominal:     General: Abdomen is flat.     Palpations: Abdomen is soft.  Musculoskeletal:     Cervical back: Normal range of motion.     Comments: Stage 2 sacral decub   Skin:    General: Skin is warm.     Capillary Refill: Capillary refill takes less than 2 seconds.  Neurological:     Comments: Tearful, crying. No obvious facial droop. Strength 4/5 bilateral arms and legs   Psychiatric:     Comments: Tearful, poor judgement      ED Results / Procedures / Treatments   Labs (all labs ordered are listed, but only abnormal results are displayed) Labs Reviewed  CBC WITH DIFFERENTIAL/PLATELET - Abnormal; Notable for the following components:      Result Value   WBC 14.3 (*)    Neutro Abs 12.0 (*)    All other components within normal limits   COMPREHENSIVE METABOLIC PANEL - Abnormal; Notable for the following components:   Glucose, Bld 227 (*)    BUN 38 (*)    AST 68 (*)    Total Bilirubin 1.4 (*)    All other components within normal limits  SALICYLATE LEVEL - Abnormal; Notable for the following components:   Salicylate Lvl <2.5 (*)    All other components within normal limits  ACETAMINOPHEN LEVEL - Abnormal; Notable for the following components:   Acetaminophen (Tylenol), Serum <10 (*)    All other components within normal limits  CK - Abnormal; Notable for the following components:   Total CK 2,157 (*)    All other components within normal limits  I-STAT CHEM 8, ED - Abnormal; Notable for the following components:   BUN 36 (*)    Glucose, Bld 227 (*)    All other components within normal limits  CBG MONITORING, ED - Abnormal; Notable for the following components:   Glucose-Capillary 192 (*)    All other components within normal limits  I-STAT CHEM 8, ED - Abnormal; Notable for the following components:   BUN 36 (*)    Glucose, Bld 227 (*)    All other components within normal limits  CULTURE, BLOOD (ROUTINE X 2)  CULTURE, BLOOD (ROUTINE X 2)  URINE CULTURE  ETHANOL  LACTIC ACID, PLASMA  RAPID URINE DRUG SCREEN, HOSP PERFORMED  LACTIC ACID, PLASMA  URINALYSIS, ROUTINE W REFLEX MICROSCOPIC  POC SARS CORONAVIRUS 2 AG -  ED    EKG EKG Interpretation  Date/Time:  Saturday October 13 2019 15:49:48 EST Ventricular Rate:  78 PR Interval:    QRS Duration: 89 QT Interval:  373 QTC Calculation: 425 R Axis:   45 Text Interpretation: Sinus rhythm Borderline T abnormalities, inferior leads Baseline wander in lead(s) I III aVL No significant change since last tracing Confirmed by Wandra Arthurs [phone]) on 10/13/2019 3:55:27 PM   Radiology CT Head  Wo Contrast  Result Date: 10/13/2019 CLINICAL DATA:  Altered mental status, TIA EXAM: CT HEAD WITHOUT CONTRAST TECHNIQUE: Contiguous axial images were obtained from the  base of the skull through the vertex without intravenous contrast. COMPARISON:  CT head 10/13/2011 FINDINGS: Brain: No evidence of acute infarction, hemorrhage, hydrocephalus, extra-axial collection or mass lesion/mass effect. Symmetric prominence of the ventricles, cisterns and sulci compatible with parenchymal volume loss. Patchy areas of white matter hypoattenuation are most compatible with chronic microvascular angiopathy. Vascular: No hyperdense vessel or unexpected calcification. Skull: No calvarial fracture or suspicious osseous lesion. No scalp swelling or hematoma. Sinuses/Orbits: Paranasal sinuses and mastoid air cells are predominantly clear. Included orbital structures are unremarkable. Other: None IMPRESSION: No CT evidence of acute intracranial pathology. Electronically Signed   By: Lovena Le M.D.   On: 10/13/2019 17:07   DG Chest Port 1 View  Result Date: 10/13/2019 CLINICAL DATA:  Altered mental status, UTI EXAM: PORTABLE CHEST 1 VIEW COMPARISON:  09/17/2019 FINDINGS: The heart size and mediastinal contours are within normal limits. Both lungs are clear. The visualized skeletal structures are unremarkable. IMPRESSION: No active disease. Electronically Signed   By: Davina Poke D.O.   On: 10/13/2019 16:49    Procedures Procedures (including critical care time)  CRITICAL CARE Performed by: Wandra Arthurs   Total critical care time: 30  minutes  Critical care time was exclusive of separately billable procedures and treating other patients.  Critical care was necessary to treat or prevent imminent or life-threatening deterioration.  Critical care was time spent personally by me on the following activities: development of treatment plan with patient and/or surrogate as well as nursing, discussions with consultants, evaluation of patient's response to treatment, examination of patient, obtaining history from patient or surrogate, ordering and performing treatments and interventions,  ordering and review of laboratory studies, ordering and review of radiographic studies, pulse oximetry and re-evaluation of patient's condition.  Medications Ordered in ED Medications  sodium chloride 0.9 % bolus 1,000 mL (1,000 mLs Intravenous New Bag/Given 10/13/19 1640)    ED Course  I have reviewed the triage vital signs and the nursing notes.  Pertinent labs & imaging results that were available during my care of the patient were reviewed by me and considered in my medical decision making (see chart for details).    MDM Rules/Calculators/A&P                      Annali Lybrand is a 64 y.o. female here presenting with weakness, anxiety. Patient appears very anxious and has been in bed for the last several days. Patient developed a stage II sacral decub ulcer in the meantime.  I wonder if she developed rhabdomyolysis or dehydration from not eating or drinking.  Will check labs and hydrate patient and get CT head chest x-ray and urinalysis.   6:23 PM CK 2000. Cr normal. Has stage 2 sacral decub from laying in bed. Will admit for rhabdo. Will need physical therapy as well.   Final Clinical Impression(s) / ED Diagnoses Final diagnoses:  None    Rx / DC Orders ED Discharge Orders    None       Drenda Freeze, MD 10/13/19 1842

## 2019-10-13 NOTE — ED Triage Notes (Signed)
Pt BIB [facility] from home. Pt c/o AMS per family. Pt was diagnosed with a UTI on Monday and has not taking medications. Pt has been on the couch since Wednesday per family. Pt is soiled upon arrival and answers some questions.

## 2019-10-13 NOTE — ED Notes (Signed)
In and Out successful with Leafy Kindle, RN

## 2019-10-13 NOTE — ED Notes (Signed)
Bladder scanned pt due to her having no urinary output so far.  First reading: 870m Second reading: 8731m

## 2019-10-14 ENCOUNTER — Other Ambulatory Visit: Payer: Self-pay

## 2019-10-14 ENCOUNTER — Encounter (HOSPITAL_COMMUNITY): Payer: Self-pay | Admitting: Internal Medicine

## 2019-10-14 DIAGNOSIS — L89152 Pressure ulcer of sacral region, stage 2: Secondary | ICD-10-CM | POA: Diagnosis present

## 2019-10-14 DIAGNOSIS — G9341 Metabolic encephalopathy: Secondary | ICD-10-CM | POA: Diagnosis present

## 2019-10-14 DIAGNOSIS — R748 Abnormal levels of other serum enzymes: Secondary | ICD-10-CM | POA: Diagnosis present

## 2019-10-14 LAB — URINALYSIS, ROUTINE W REFLEX MICROSCOPIC
Bacteria, UA: NONE SEEN
Bilirubin Urine: NEGATIVE
Glucose, UA: >=500 mg/dL — AB
Hgb urine dipstick: NEGATIVE
Ketones, ur: 20 mg/dL — AB
Leukocytes,Ua: NEGATIVE
Nitrite: NEGATIVE
Protein, ur: 30 mg/dL — AB
Specific Gravity, Urine: 1.025 (ref 1.005–1.030)
pH: 6 (ref 5.0–8.0)

## 2019-10-14 LAB — COMPREHENSIVE METABOLIC PANEL
ALT: 35 U/L (ref 0–44)
AST: 56 U/L — ABNORMAL HIGH (ref 15–41)
Albumin: 3.3 g/dL — ABNORMAL LOW (ref 3.5–5.0)
Alkaline Phosphatase: 69 U/L (ref 38–126)
Anion gap: 8 (ref 5–15)
BUN: 33 mg/dL — ABNORMAL HIGH (ref 8–23)
CO2: 24 mmol/L (ref 22–32)
Calcium: 9.5 mg/dL (ref 8.9–10.3)
Chloride: 113 mmol/L — ABNORMAL HIGH (ref 98–111)
Creatinine, Ser: 0.67 mg/dL (ref 0.44–1.00)
GFR calc Af Amer: >60 mL/min (ref >60)
GFR calc non Af Amer: >60 mL/min (ref >60)
Glucose, Bld: 185 mg/dL — ABNORMAL HIGH (ref 70–99)
Potassium: 4 mmol/L (ref 3.5–5.1)
Sodium: 145 mmol/L (ref 135–145)
Total Bilirubin: 0.8 mg/dL (ref 0.3–1.2)
Total Protein: 6.7 g/dL (ref 6.5–8.1)

## 2019-10-14 LAB — CBC
HCT: 37.9 % (ref 36.0–46.0)
Hemoglobin: 12.1 g/dL (ref 12.0–15.0)
MCH: 29.5 pg (ref 26.0–34.0)
MCHC: 31.9 g/dL (ref 30.0–36.0)
MCV: 92.4 fL (ref 80.0–100.0)
Platelets: 169 10*3/uL (ref 150–400)
RBC: 4.1 MIL/uL (ref 3.87–5.11)
RDW: 15.3 % (ref 11.5–15.5)
WBC: 11.3 10*3/uL — ABNORMAL HIGH (ref 4.0–10.5)
nRBC: 0 % (ref 0.0–0.2)

## 2019-10-14 LAB — CK: Total CK: 1504 U/L — ABNORMAL HIGH (ref 38–234)

## 2019-10-14 LAB — GLUCOSE, CAPILLARY
Glucose-Capillary: 161 mg/dL — ABNORMAL HIGH (ref 70–99)
Glucose-Capillary: 190 mg/dL — ABNORMAL HIGH (ref 70–99)

## 2019-10-14 LAB — RAPID URINE DRUG SCREEN, HOSP PERFORMED
Amphetamines: NOT DETECTED
Barbiturates: NOT DETECTED
Benzodiazepines: POSITIVE — AB
Cocaine: NOT DETECTED
Opiates: NOT DETECTED
Tetrahydrocannabinol: NOT DETECTED

## 2019-10-14 LAB — HIV ANTIBODY (ROUTINE TESTING W REFLEX): HIV Screen 4th Generation wRfx: NONREACTIVE

## 2019-10-14 LAB — CBG MONITORING, ED: Glucose-Capillary: 165 mg/dL — ABNORMAL HIGH (ref 70–99)

## 2019-10-14 NOTE — H&P (Signed)
History and Physical    Jeanette Rose PNT:[id number] DOB: 08/16/1956 DOA: 10/13/2019  PCP: Salvatore Marvel, PA-C (Confirm with patient/family/NH records and if not entered, this has to be entered at [facility] point of entry) Patient coming from: Home  I have personally briefly reviewed patient's old medical records in [place]  Chief Complaint: Altered mental status  HPI: Jeanette Rose is a 64 y.o. female with medical history significant of hypertension, diabetes mellitus, anxiety/depression and anemia is brought to ED by EMS for evaluation of altered mental status.  Patient is stable confused and hardly able to answer all the questions appropriately and the speech is very difficult to understand because patient speaks very slow and mumbles.  Most of the history is gathered from ED physician and medical records.  Patient was seen in the ED 5 days ago for anxiety/depression and found to have UTI and was discharged home with antibiotics but patient did not use the antibiotics as per ED physician.  As per ED physician patient was in severe psychosis and refused to see her family members for 3 days and the family members contacted the home health.  ED physician further mentioned that the patient's sister reported that the home health found the patient in psychosis and she was in bed covered that had and the home health called EMS and patient is brought to ED for further evaluation.  During my evaluation, the patient was still confused and her speech was poor but she was able to say yes and no on most of the questions.  Patient admitted of feeling very weak and was unable to move from the bed and she also admitted of having urinary difficulty.  According to the ED physician patient is living alone by herself.   ED Course: On arrival to the ED patient had blood pressure of 121/78, heart rate 65, respiratory rate 21 and oxygen saturation 96% on room air.  CBC showed leukocytosis with WBC count of 14.3.   Patient also had elevated blood glucose level of 227 and elevated creatinine kinase to 2157.  AST 68 and total bilirubin 1.4.  In the ED patient's diaper was filled with very foul-smelling urine.  CT head was negative for acute intracranial bleed or pathology.  In ED patient was given bolus of fluids and urine sent for urinalysis.  Review of Systems: As per HPI otherwise 10 point review of systems negative.    Past Medical History:  Diagnosis Date  . Acute acalculous cholecystitis s/p lap chole 04/03/2014 04/02/2014  . Allergy   . Anemia   . Anxiety   . Asthma   . Depression   . Fatty liver    noted on CT per [facility] records  . Fibromyalgia   . GERD (gastroesophageal reflux disease)   . Hyperlipidemia associated with type 2 diabetes mellitus ([place])   . Hypertension   . Hypothyroid 2015   Transiently in 2015. Synthroid 66mg was stopped 05/07/2016 w/ tsh nml following.  . Obesity (BMI 30-39.9)   . OSA (obstructive sleep apnea)    non-compliant with CPAP  . Seborrheic dermatitis    [facility] face and scalp[facility]  . Stroke ([facility]    TIA  . Type 2 diabetes mellitus ([facility]     Past Surgical History:  Procedure Laterality Date  . BREAST REDUCTION SURGERY  06/2008  . CHOLECYSTECTOMY N/A 04/03/2014   Procedure: LAPAROSCOPIC CHOLECYSTECTOMY ;  Surgeon: SAdin Hector MD;  Location: [facility] ORS;  Service: General;  Laterality: N/A;  . REDUCTION MAMMAPLASTY    . TUBAL LIGATION       reports that she quit smoking about 15 years ago. Her smoking use included cigarettes. She has a 60.00 pack-year smoking history. She has never used smokeless tobacco. She reports that she does not drink alcohol or use drugs.  Allergies  Allergen Reactions  . Farxiga [Dapagliflozin] Anxiety and Other (See Comments)    Made depression and anxiety worse  . Rexulti [Brexpiprazole] Anxiety and Other (See Comments)    Made anxiety worse  . Trintellix [Vortioxetine] Anxiety and Other (See Comments)     Made anxiety worse    Family History  Problem Relation Age of Onset  . Atrial fibrillation Mother   . Heart disease Father        CAD  . Diabetes Brother   . Cancer Maternal Grandmother   . Diabetes Paternal Grandmother   . Fibromyalgia Sister   . Colitis Sister      Prior to Admission medications   Medication Sig Start Date End Date Taking? Authorizing Provider  aspirin 81 MG EC tablet Take 1 tablet (81 mg total) by mouth daily. Swallow whole. 09/27/19  Yes Money, Lowry Ram, FNP  atorvastatin (LIPITOR) 80 MG tablet Take 1 tablet (80 mg total) by mouth at bedtime. 09/27/19  Yes Money, Lowry Ram, FNP  busPIRone (BUSPAR) 30 MG tablet Take 1 tablet (30 mg total) by mouth 2 (two) times daily. 09/29/19 09/28/20 Yes Sharma Covert, MD  diazepam (VALIUM) 5 MG tablet Take 5-10 mg by mouth 3 (three) times daily as needed for anxiety.   Yes [provider]  docusate sodium (COLACE) 100 MG capsule Take 1 capsule (100 mg total) by mouth 2 (two) times daily. Patient taking differently: Take 100 mg by mouth daily as needed for mild constipation or moderate constipation.  09/27/19  Yes Money, Lowry Ram, FNP  fluvoxaMINE (LUVOX) 100 MG tablet Take 1 tablet (100 mg total) by mouth at bedtime. 09/27/19  Yes Money, Lowry Ram, FNP  irbesartan (AVAPRO) 300 MG tablet Take 1 tablet (300 mg total) by mouth daily. 09/27/19  Yes Money, Lowry Ram, FNP  metFORMIN (GLUCOPHAGE-XR) 500 MG 24 hr tablet Take 1,000 mg by mouth daily with breakfast. 09/27/19  Yes [provider]  pantoprazole (PROTONIX) 40 MG tablet Take 1 tablet (40 mg total) by mouth 2 (two) times daily. 09/27/19  Yes Money, Lowry Ram, FNP  cephALEXin (KEFLEX) 500 MG capsule Take 1 capsule (500 mg total) by mouth 2 (two) times daily for 7 days. 10/08/19 10/15/19  Gareth Marse, MD  Iloperidone 2 MG TABS Take 1 tablet (2 mg total) by mouth 2 (two) times daily. 09/27/19   Money, Lowry Ram, FNP  insulin glargine (LANTUS) 100 UNIT/ML injection  Inject 0.28 mLs (28 Units total) into the skin every morning. 09/27/19   Money, Lowry Ram, FNP    Physical Exam: Vitals:   10/14/19 0215 10/14/19 0230 10/14/19 0245 10/14/19 0300  BP:  130/65  135/69  Pulse: 66 61 (!) 57 60  Resp: 17 20 17 18   Temp:      TempSrc:      SpO2: 100% 99% 99% 99%    Constitutional: NAD, calm, comfortable Vitals:   10/14/19 0215 10/14/19 0230 10/14/19 0245 10/14/19 0300  BP:  130/65  135/69  Pulse: 66 61 (!) 57 60  Resp: 17 20 17 18   Temp:      TempSrc:      SpO2: 100%  99% 99% 99%   General: Patient looks tearful and anxious  Eyes: PERRL, lids and conjunctivae normal ENMT: Mucous membranes are dry. Posterior pharynx clear of any exudate or lesions.Normal dentition.  Neck: normal, supple, no masses, no thyromegaly Respiratory: clear to auscultation bilaterally, no wheezing, no crackles. Normal respiratory effort. No accessory muscle use.  Cardiovascular: Regular rate and rhythm, no murmurs / rubs / gallops. No extremity edema. 2+ pedal pulses. No carotid bruits.  Abdomen: no tenderness, no masses palpated. No hepatosplenomegaly. Bowel sounds positive.  Musculoskeletal: no clubbing / cyanosis. No joint deformity upper and lower extremities. Good ROM, no contractures. Normal muscle tone.  Skin: Stage II sacral decubitus ulcer but no signs of infection. Neurologic: Patient is awake, alert and oriented to place but not to person and time.  CN 2-12 grossly intact. Sensation intact, DTR normal. Strength 4/5 in all 4 extremities.  Psychiatric: Patient is anxious and tearful with poor judgment  Labs on Admission: I have personally reviewed following labs and imaging studies  CBC: Recent Labs  Lab 10/13/19 1544 10/13/19 1651 10/13/19 1656 10/13/19 1922  WBC 14.3*  --   --  14.3*  NEUTROABS 12.0*  --   --   --   HGB 13.6 13.9 13.9 12.5  HCT 42.8 41.0 41.0 39.5  MCV 92.0  --   --  91.9  PLT 203  --   --  427   Basic Metabolic Panel: Recent Labs  Lab  10/13/19 1544 10/13/19 1651 10/13/19 1656 10/13/19 1922  NA 142 142 143  --   K 4.4 4.4 4.4  --   CL 106 109 108  --   CO2 26  --   --   --   GLUCOSE 227* 227* 227*  --   BUN 38* 36* 36*  --   CREATININE 0.76 0.80 0.70 0.55  CALCIUM 10.1  --   --   --    GFR: CrCl cannot be calculated (Unknown ideal weight.). Liver Function Tests: Recent Labs  Lab 10/13/19 1544  AST 68*  ALT 41  ALKPHOS 86  BILITOT 1.4*  PROT 8.1  ALBUMIN 4.1   No results for input(s): LIPASE, AMYLASE in the last 168 hours. No results for input(s): AMMONIA in the last 168 hours. Coagulation Profile: No results for input(s): INR, PROTIME in the last 168 hours. Cardiac Enzymes: Recent Labs  Lab 10/13/19 1544  CKTOTAL 2,157*   BNP (last 3 results) No results for input(s): PROBNP in the last 8760 hours. HbA1C: No results for input(s): HGBA1C in the last 72 hours. CBG: Recent Labs  Lab 10/07/19 0822 10/08/19 0829 10/13/19 1554 10/13/19 2123  GLUCAP 131* 137* 192* 162*   Lipid Profile: No results for input(s): CHOL, HDL, LDLCALC, TRIG, CHOLHDL, LDLDIRECT in the last 72 hours. Thyroid Function Tests: No results for input(s): TSH, T4TOTAL, FREET4, T3FREE, THYROIDAB in the last 72 hours. Anemia Panel: No results for input(s): VITAMINB12, FOLATE, FERRITIN, TIBC, IRON, RETICCTPCT in the last 72 hours. Urine analysis:    Component Value Date/Time   COLORURINE AMBER (A) 10/13/2019 2205   APPEARANCEUR CLEAR 10/13/2019 2205   LABSPEC 1.025 10/13/2019 2205   PHURINE 6.0 10/13/2019 2205   GLUCOSEU >=500 (A) 10/13/2019 2205   HGBUR NEGATIVE 10/13/2019 2205   BILIRUBINUR NEGATIVE 10/13/2019 2205   BILIRUBINUR small (A) 01/05/2018 1232   KETONESUR 20 (A) 10/13/2019 2205   PROTEINUR 30 (A) 10/13/2019 2205   UROBILINOGEN 1.0 01/05/2018 1232   UROBILINOGEN 2.0 (H) 04/05/2014 1208  NITRITE NEGATIVE 10/13/2019 2205   LEUKOCYTESUR NEGATIVE 10/13/2019 2205    Radiological Exams on Admission: CT Head  Wo Contrast  Result Date: 10/13/2019 CLINICAL DATA:  Altered mental status, TIA EXAM: CT HEAD WITHOUT CONTRAST TECHNIQUE: Contiguous axial images were obtained from the base of the skull through the vertex without intravenous contrast. COMPARISON:  CT head 10/13/2011 FINDINGS: Brain: No evidence of acute infarction, hemorrhage, hydrocephalus, extra-axial collection or mass lesion/mass effect. Symmetric prominence of the ventricles, cisterns and sulci compatible with parenchymal volume loss. Patchy areas of white matter hypoattenuation are most compatible with chronic microvascular angiopathy. Vascular: No hyperdense vessel or unexpected calcification. Skull: No calvarial fracture or suspicious osseous lesion. No scalp swelling or hematoma. Sinuses/Orbits: Paranasal sinuses and mastoid air cells are predominantly clear. Included orbital structures are unremarkable. Other: None IMPRESSION: No CT evidence of acute intracranial pathology. Electronically Signed   By: Lovena Le M.D.   On: 10/13/2019 17:07   DG Chest Port 1 View  Result Date: 10/13/2019 CLINICAL DATA:  Altered mental status, UTI EXAM: PORTABLE CHEST 1 VIEW COMPARISON:  09/17/2019 FINDINGS: The heart size and mediastinal contours are within normal limits. Both lungs are clear. The visualized skeletal structures are unremarkable. IMPRESSION: No active disease. Electronically Signed   By: Davina Poke D.O.   On: 10/13/2019 16:49      Assessment/Plan Principal Problem:   Acute metabolic encephalopathy Acute metabolic encephalopathy most likely secondary to sepsis from UTI in the setting of anxiety/depression and poor oral intake for more than 5 days. CT scanning of head negative for acute intracranial bleed or pathology. Patient's condition is much improved at this time.  She is awake, alert and oriented to place but not to person and time.  Patient is able to answer few questions by saying yes and no appropriately. Continue IV fluids  for dehydration and IV ceftriaxone for UTI.  Active Problems:   Type 2 diabetes mellitus with diabetic polyneuropathy, with long-term current use of insulin (HCC) Sliding scale insulin ordered Blood glucose monitoring before meals and at bedtime ordered Hypoglycemic protocol in place.    Sepsis secondary to UTI [facility]) Patient was given bolus of IV normal saline in the ED. Patient started on IV ceftriaxone UA ordered but patient unable to pee most likely from dehydration but the urine in diaper was foul-smelling. Continue IV fluids with normal saline at the rate of 100 mL/h. Blood and urine cultures ordered    Elevated CPK Continue to monitor CPK level.  Elevated CPK is from severe dehydration and staying on the bed for more than 3 days.    Sacral decubitus ulcer, stage II ([place]) Patient has new onset stage II sacral decubitus ulcer most likely from prolonged laying in the bed.  Patient has no previous history of sacral ulcer. Wound nurse consultation ordered. PT/OT consultation ordered for evaluation and treatment.    DVT prophylaxis: Lovenox Code Status: Full code  Consults called: Wound care, PT/OT Admission status: Inpatient/MedSurg   Edmonia Lynch MD [facility]   If 7PM-7AM, please contact night-coverage [email or web address] Password   10/14/2019, 3:11 AM

## 2019-10-14 NOTE — Evaluation (Signed)
Physical Therapy Evaluation Patient Details Name: Jeanette Rose MRN: [id number] DOB: [birth year] Today's Date: 10/14/2019   History of Present Illness  Jeanette Rose is a 64 y.o. female with medical history significant of hypertension, diabetes mellitus, anxiety/depression and anemia is brought to ED by EMS for evaluation of altered mental status.   Clinical Impression  Per chart, PTA, pt was living at home alone she has not been completing ADL/IADL and has been on the couch since Wednesday has not been taking medications. She was recently discharged 1/12. Pt currently requires minA+2 for stand-pivot transfer to the recliner, she requires maximum encouragement for initiation and completion of task. Pt frequently states "I can't do it" when prompted to engage in mobility or complete ADL. She demonstrates significant and debilitating cognitive limitations with completion of basic self care tasks and demonstrates decreased awareness and insight to her abilities. Pt is unsafe to return home alone, she requires 24/7 physical assistance. Due to decline in current level of function, pt would benefit from acute PT  to regain mobility and to facilitate safe D/C to venue listed below. At this time, recommend long term SNF follow-up. Will continue to follow acutely.    Follow Up Recommendations SNF;Supervision/Assistance - 24 hour(long term care: group home?)    Equipment Recommendations  None recommended by PT    Recommendations for Other Services       Precautions / Restrictions Precautions Precautions: Fall Restrictions Weight Bearing Restrictions: No      Mobility  Bed Mobility Overal bed mobility: Needs Assistance Bed Mobility: Supine to Sit     Supine to sit: Mod assist;HOB elevated;Max assist     General bed mobility comments: increased time with cues for sequencing;modA to progress trunk upright  Transfers Overall transfer level: Needs assistance Equipment used: Rolling walker (2  wheeled) Transfers: Sit to/from [facility] Sit to Stand: Min assist;+2 physical assistance;+2 safety/equipment;From elevated surface Stand pivot transfers: Min assist;+2 physical assistance;+2 safety/equipment       General transfer comment: increased time, cues for safety  Ambulation/Gait Ambulation/Gait assistance: Min assist;+2 physical assistance;+2 safety/equipment Gait Distance (Feet): 2 Feet Assistive device: Rolling walker (2 wheeled) Gait Pattern/deviations: Step-to pattern;Decreased step length - right;Decreased step length - left;Shuffle;Trunk flexed;Narrow base of support Gait velocity: decr   General Gait Details: short shuffling steps; constant encouragement, assist for balance and RW management  Stairs            Wheelchair Mobility    Modified Rankin (Stroke Patients Only)       Balance Overall balance assessment: Needs assistance Sitting-balance support: Feet supported;No upper extremity supported Sitting balance-Leahy Scale: Poor Sitting balance - Comments: does not maintain feet on ground Postural control: Posterior lean Standing balance support: Bilateral upper extremity supported Standing balance-Leahy Scale: Poor                               Pertinent Vitals/Pain Pain Assessment: Faces Faces Pain Scale: Hurts a little bit Pain Location: generalized with movement Pain Descriptors / Indicators: Guarding;Grimacing Pain Intervention(s): Limited activity within patient's tolerance;Monitored during session    Home Living Family/patient expects to be discharged to:: Unsure Living Arrangements: Alone                    Prior Function Level of Independence: Independent         Comments: pt lives alone. She has been staying on the couch, she was not taking medication,  not eating, and was covered in urine when found.      Hand Dominance   Dominant Hand: Right    Extremity/Trunk Assessment   Upper  Extremity Assessment Upper Extremity Assessment: Generalized weakness    Lower Extremity Assessment Lower Extremity Assessment: Generalized weakness    Cervical / Trunk Assessment Cervical / Trunk Assessment: Normal  Communication   Communication: No difficulties(soft spoken)  Cognition Arousal/Alertness: Awake/alert Behavior During Therapy: Flat affect Overall Cognitive Status: No family/caregiver present to determine baseline cognitive functioning                                 General Comments: pt requries max encouragement for participation during session but follows cues consistently, when prompted to do something pt will often reply with "I can't";significant self limiting behaviors and decreased awareness of abilities, poor self-awareness, significantly debilitating mental health limitations impacting her independence with self-care      General Comments General comments (skin integrity, edema, etc.): vss    Exercises     Assessment/Plan    PT Assessment Patient needs continued PT services  PT Problem List Decreased strength;Decreased activity tolerance;Decreased balance;Decreased mobility;Pain;Decreased knowledge of use of DME       PT Treatment Interventions DME instruction;Functional mobility training;Gait training;Therapeutic activities;Therapeutic exercise;Balance training;Patient/family education    PT Goals (Current goals can be found in the Care Plan section)  Acute Rehab PT Goals Patient Stated Goal: pt did not state Time For Goal Achievement: 10/28/19 Potential to Achieve Goals: Fair    Frequency Min 3X/week   Barriers to discharge Decreased caregiver support lives alone    Co-evaluation               AM-PAC PT "6 Clicks" Mobility  Outcome Measure Help needed turning from your back to your side while in a flat bed without using bedrails?: A Lot Help needed moving from lying on your back to sitting on the side of a flat bed  without using bedrails?: A Lot Help needed moving to and from a bed to a chair (including a wheelchair)?: A Lot Help needed standing up from a chair using your arms (e.g., wheelchair or bedside chair)?: A Lot Help needed to walk in hospital room?: A Lot Help needed climbing 3-5 steps with a railing? : Total 6 Click Score: 11    End of Session Equipment Utilized During Treatment: Gait belt Activity Tolerance: Other (comment)(anxiety) Patient left: in chair;with call bell/phone within reach;with chair alarm set Nurse Communication: Mobility status PT Visit Diagnosis: Muscle weakness (generalized) (M62.81);Difficulty in walking, not elsewhere classified (R26.2)    Time: 1100-1120 PT Time Calculation (min) (ACUTE ONLY): 20 min   Charges:   PT Evaluation $PT Eval Moderate Complexity: [place] Pager [phone] Office [phone]   Prim Morace 10/14/2019, 1:39 PM

## 2019-10-14 NOTE — Progress Notes (Addendum)
Jeanette Rose is a 64 y.o. female with medical history significant of hypertension, type II diabetes mellitus, chronic anxiety/depression who was brought to ED by EMS for evaluation of altered mental status.  She lives alone.  Recently admitted and treated for UTI.  UA positive for pyuria and CT head nonacute.  Admitted for presumed UTI.  Cultures are in process.  Started empirically on Rocephin.  10/14/19: Seen and examined in the ED.  She is alert and confused.  She is able to answer yes or no questions.  Denies any pain.  Please refer to H&P dictated by my partner Dr. Humphrey Rolls on 10/14/2019 for further details of the assessment and plan.

## 2019-10-14 NOTE — ED Notes (Signed)
While Chrys Racer, RN was on the phone with Miquel Dunn, RN giving report... patient bed got changed to 5E. Will follow up and give report to that nurse when bed is ready.

## 2019-10-14 NOTE — Progress Notes (Signed)
Occupational Therapy Evaluation Patient Details Name: Jeanette Rose MRN: [id number] DOB: 09-15-1956 Today's Date: 10/14/2019    History of Present Illness Jeanette Rose is a 64 y.o. female with medical history significant of hypertension, diabetes mellitus, anxiety/depression and anemia is brought to ED by EMS for evaluation of altered mental status.    Clinical Impression   Per chart, PTA, pt was living at home alone she has not been completing ADL/IADL and has been on the couch since Wednesday has not been taking medications. She was recently discharged 1/12. Pt currently requires minA+2 for stand-pivot transfer to the recliner, she requires maximum encouragement for initiation and completion of task. Pt frequently states "I can't do it" when prompted to engage in mobility or complete ADL. She demonstrates significant and debilitating cognitive limitations with completion of basic self care tasks and demonstrates decreased awareness and insight to her abilities. Pt is unsafe to return home alone, she requires 24/7 physical assistance. Due to decline in current level of function, pt would benefit from acute OT to address established goals to facilitate safe D/C to venue listed below. At this time, recommend long term SNF follow-up. Will continue to follow acutely.     Follow Up Recommendations  SNF;Supervision/Assistance - 24 hour(long term)(possibly group home)    Equipment Recommendations  3 in 1 bedside commode    Recommendations for Other Services       Precautions / Restrictions Precautions Precautions: Fall Restrictions Weight Bearing Restrictions: No      Mobility Bed Mobility Overal bed mobility: Needs Assistance Bed Mobility: Supine to Sit     Supine to sit: Mod assist;HOB elevated;Max assist     General bed mobility comments: increased time with cues for sequencing;modA to progress trunk upright  Transfers Overall transfer level: Needs assistance Equipment used:  Rolling walker (2 wheeled) Transfers: Sit to/from [facility] Sit to Stand: Min assist;+2 physical assistance;+2 safety/equipment;From elevated surface Stand pivot transfers: Min assist;+2 physical assistance;+2 safety/equipment       General transfer comment: increased time, cues for safety    Balance Overall balance assessment: Needs assistance Sitting-balance support: Feet supported;No upper extremity supported Sitting balance-Leahy Scale: Poor Sitting balance - Comments: does not maintain feet on ground Postural control: Posterior lean Standing balance support: Bilateral upper extremity supported Standing balance-Leahy Scale: Poor                             ADL either performed or assessed with clinical judgement   ADL Overall ADL's : Needs assistance/impaired Eating/Feeding: Set up;Sitting   Grooming: Minimal assistance;Sitting   Upper Body Bathing: Minimal assistance;Sitting   Lower Body Bathing: Moderate assistance;Sit to/from stand   Upper Body Dressing : Minimal assistance   Lower Body Dressing: Moderate assistance;Sit to/from stand   Toilet Transfer: +2 for physical assistance;+2 for safety/equipment;Minimal assistance   Toileting- Clothing Manipulation and Hygiene: Moderate assistance       Functional mobility during ADLs: Minimal assistance;+2 for physical assistance;+2 for safety/equipment;Rolling walker General ADL Comments: limited by cognition, requires extended time for ADL completion, max encouragement for initiation     Vision Patient Visual Report: No change from baseline       Perception     Praxis      Pertinent Vitals/Pain Pain Assessment: Faces Faces Pain Scale: Hurts a little bit Pain Location: generalized with movement Pain Descriptors / Indicators: Guarding;Grimacing Pain Intervention(s): Monitored during session     Hand Dominance Right  Extremity/Trunk Assessment Upper Extremity Assessment Upper  Extremity Assessment: Generalized weakness   Lower Extremity Assessment Lower Extremity Assessment: Generalized weakness   Cervical / Trunk Assessment Cervical / Trunk Assessment: Normal   Communication Communication Communication: No difficulties(soft spoken)   Cognition Arousal/Alertness: Awake/alert Behavior During Therapy: Flat affect Overall Cognitive Status: No family/caregiver present to determine baseline cognitive functioning                                 General Comments: pt with requries max encouragement for participation during session but follows cues consistently, when prompted to do something pt will often reply with "I can't";significant self limiting behaviors and decreased awareness of abilities, poor self-awareness, significantly debilitating mental health limitations impacting her independence with self-care   General Comments  vss    Exercises     Shoulder Instructions      Home Living Family/patient expects to be discharged to:: Unsure Living Arrangements: Alone                                      Prior Functioning/Environment Level of Independence: Independent        Comments: pt lives alone. She has been staying on the couch, she was not taking medication, not eating, and was covered in urine when found.         OT Problem List: Decreased strength;Decreased range of motion;Decreased activity tolerance;Impaired balance (sitting and/or standing);Decreased cognition;Decreased safety awareness;Decreased knowledge of precautions;Impaired UE functional use      OT Treatment/Interventions: Self-care/ADL training;Therapeutic exercise;Energy conservation;DME and/or AE instruction;Therapeutic activities;Cognitive remediation/compensation;Patient/family education;Balance training    OT Goals(Current goals can be found in the care plan section) Acute Rehab OT Goals Patient Stated Goal: pt did not state OT Goal Formulation:  With patient Time For Goal Achievement: 10/28/19 Potential to Achieve Goals: Fair ADL Goals Pt Will Perform Grooming: with min guard assist;sitting Pt Will Perform Upper Body Dressing: with min guard assist;sitting Pt Will Perform Lower Body Dressing: with min guard assist;sit to/from stand Pt Will Transfer to Toilet: with min guard assist;ambulating Additional ADL Goal #1: Pt will initiate ADL with minimal prompting.  OT Frequency: Min 2X/week   Barriers to D/C: Decreased caregiver support  pt lives alone       Co-evaluation              AM-PAC OT "6 Clicks" Daily Activity     Outcome Measure Help from another person eating meals?: A Little Help from another person taking care of personal grooming?: A Lot Help from another person toileting, which includes using toliet, bedpan, or urinal?: A Lot Help from another person bathing (including washing, rinsing, drying)?: A Lot Help from another person to put on and taking off regular upper body clothing?: A Lot Help from another person to put on and taking off regular lower body clothing?: A Lot 6 Click Score: 13   End of Session Equipment Utilized During Treatment: Gait belt;Rolling walker Nurse Communication: Mobility status  Activity Tolerance: Patient tolerated treatment well Patient left: in chair;with call bell/phone within reach;with chair alarm set  OT Visit Diagnosis: Unsteadiness on feet (R26.81);Other abnormalities of gait and mobility (R26.89);Muscle weakness (generalized) (M62.81);Other symptoms and signs involving cognitive function                Time: 1100-1120 OT Time Calculation (min): 20 min Charges:  OT General Charges $OT Visit: 1 Visit OT Evaluation $OT Eval Moderate Complexity: Hayden OTR/L Acute Rehabilitation Services Office: [place] 10/14/2019, 12:53 PM

## 2019-10-15 DIAGNOSIS — G9341 Metabolic encephalopathy: Secondary | ICD-10-CM

## 2019-10-15 LAB — COMPREHENSIVE METABOLIC PANEL
ALT: 32 U/L (ref 0–44)
AST: 45 U/L — ABNORMAL HIGH (ref 15–41)
Albumin: 3 g/dL — ABNORMAL LOW (ref 3.5–5.0)
Alkaline Phosphatase: 62 U/L (ref 38–126)
Anion gap: 8 (ref 5–15)
BUN: 20 mg/dL (ref 8–23)
CO2: 22 mmol/L (ref 22–32)
Calcium: 8.9 mg/dL (ref 8.9–10.3)
Chloride: 110 mmol/L (ref 98–111)
Creatinine, Ser: 0.47 mg/dL (ref 0.44–1.00)
GFR calc Af Amer: >60 mL/min (ref >60)
GFR calc non Af Amer: >60 mL/min (ref >60)
Glucose, Bld: 176 mg/dL — ABNORMAL HIGH (ref 70–99)
Potassium: 3.5 mmol/L (ref 3.5–5.1)
Sodium: 140 mmol/L (ref 135–145)
Total Bilirubin: 0.9 mg/dL (ref 0.3–1.2)
Total Protein: 6 g/dL — ABNORMAL LOW (ref 6.5–8.1)

## 2019-10-15 LAB — CBC WITH DIFFERENTIAL/PLATELET
Abs Immature Granulocytes: 0.02 10*3/uL (ref 0.00–0.07)
Basophils Absolute: 0.1 10*3/uL (ref 0.0–0.1)
Basophils Relative: 1 %
Eosinophils Absolute: 0.2 10*3/uL (ref 0.0–0.5)
Eosinophils Relative: 3 %
HCT: 34.6 % — ABNORMAL LOW (ref 36.0–46.0)
Hemoglobin: 11.1 g/dL — ABNORMAL LOW (ref 12.0–15.0)
Immature Granulocytes: 0 %
Lymphocytes Relative: 29 %
Lymphs Abs: 2.4 10*3/uL (ref 0.7–4.0)
MCH: 29.7 pg (ref 26.0–34.0)
MCHC: 32.1 g/dL (ref 30.0–36.0)
MCV: 92.5 fL (ref 80.0–100.0)
Monocytes Absolute: 0.5 10*3/uL (ref 0.1–1.0)
Monocytes Relative: 5 %
Neutro Abs: 5.2 10*3/uL (ref 1.7–7.7)
Neutrophils Relative %: 62 %
Platelets: 161 10*3/uL (ref 150–400)
RBC: 3.74 MIL/uL — ABNORMAL LOW (ref 3.87–5.11)
RDW: 14.9 % (ref 11.5–15.5)
WBC: 8.3 10*3/uL (ref 4.0–10.5)
nRBC: 0 % (ref 0.0–0.2)

## 2019-10-15 LAB — GLUCOSE, CAPILLARY
Glucose-Capillary: 178 mg/dL — ABNORMAL HIGH (ref 70–99)
Glucose-Capillary: 294 mg/dL — ABNORMAL HIGH (ref 70–99)
Glucose-Capillary: 199 mg/dL — ABNORMAL HIGH (ref 70–99)
Glucose-Capillary: 177 mg/dL — ABNORMAL HIGH (ref 70–99)
Glucose-Capillary: 147 mg/dL — ABNORMAL HIGH (ref 70–99)

## 2019-10-15 LAB — URINE CULTURE: Culture: NO GROWTH

## 2019-10-15 MED ORDER — CHLORHEXIDINE GLUCONATE CLOTH 2 % EX PADS
6.0000 | MEDICATED_PAD | Freq: Every day | CUTANEOUS | Status: DC
  Administered 2019-10-15 – 2019-10-17 (×3): 6 via TOPICAL

## 2019-10-15 NOTE — Progress Notes (Signed)
PROGRESS NOTE  Jeanette Rose HQI:[id number] DOB: 06-06-1956 DOA: 10/13/2019 PCP: Salvatore Marvel, PA-C  HPI/Recap of past 24 hours: Jeanette Rose a 64 y.o.femalewith medical history significant ofhypertension, type II diabetes mellitus, chronic anxiety/depression who was brought to ED by EMS for evaluation of altered mental status.  She lives alone.  Recently admitted and treated for UTI.  UA positive for pyuria and CT head nonacute.  Admitted for presumed UTI.  Cultures no growth to date.  Started empirically on Rocephin.  10/15/19: Seen and examined.  Her mentation improved alert and oriented x3.  Repeating herself stating she cannot talk however she is speaking clearly.  Had urinary retention overnight and a Foley catheter was placed.  She has no new complaints.   Assessment/Plan: Principal Problem:   Acute metabolic encephalopathy Active Problems:   Type 2 diabetes mellitus with diabetic polyneuropathy, with long-term current use of insulin (HCC)   Sepsis secondary to UTI (HCC)   Elevated CPK   Sacral decubitus ulcer, stage II ([place])  Sepsis secondary to presumed UTI Presented with leukocytosis, tachycardia, tachypnea and positive UA Recently treated for UTI during previous admission, urine culture no growth. Was started on empiric antibiotics with improvement of symptomatology Blood cultures negative to date.  Resolving acute metabolic encephalopathy likely multifactorial secondary to acute illness, dehydration versus others CT head nonacute. Today at the time of this visit she is alert and oriented x3.  Dehydration Improved with IV fluid hydration  Elevated CPK Presented with CPK greater than 1500 Repeat and follow Continue IV fluid hydration  Type 2 diabetes with hyperglycemia Hemoglobin A1c 9.9 on 09/12/2019. Continue insulin coverage  Physical debility PT assessment recommended SNF Transition of care team consulted to assist with SNF placement. Continue PT with  assistance and fall precautions.   Sacral decubitus ulcer, stage II ([place]) Patient has new onset stage II sacral decubitus ulcer most likely from prolonged laying in the bed.  Patient has no previous history of sacral ulcer. Wound nurse consultation ordered. PT/OT consultation ordered for evaluation and treatment.    DVT prophylaxis: Lovenox Code Status: Full code  Consults called: Wound care, PT/OT  Disposition: Patient is currently not appropriate for discharge at this time due to ongoing treatment for elevated CPK requiring IV fluid hydration.  Patient will require at least 2 midnights for further evaluation and treatment of present condition.  Objective: Vitals:   10/14/19 0909 10/14/19 1341 10/14/19 2100 10/15/19 0614  BP: (!) 158/75 (!) 160/75 (!) 165/73 (!) 144/82  Pulse: 83 98 85 77  Resp: 20 20 16 16   Temp: 97.9 F (36.6 C) 98 F (36.7 C) 98 F (36.7 C) 97.9 F (36.6 C)  TempSrc: Oral Oral Oral Oral  SpO2: 97% 99% 99% 100%  Height:        Intake/Output Summary (Last 24 hours) at 10/15/2019 1229 Last data filed at 10/15/2019 1126 Gross per 24 hour  Intake 4738.06 ml  Output 2550 ml  Net 2188.06 ml   There were no vitals filed for this visit.  Exam:  . General: 64 y.o. year-old female well developed well nourished in no acute distress.  Alert and oriented x3. . Cardiovascular: Regular rate and rhythm with no rubs or gallops.  No thyromegaly or JVD noted.   Marland Kitchen Respiratory: Clear to auscultation with no wheezes or rales. Good inspiratory effort. . Abdomen: Soft nontender nondistended with normal bowel sounds x4 quadrants. . Musculoskeletal: No lower extremity edema. 2/4 pulses in all 4 extremities. Marland Kitchen Psychiatry: Mood  is appropriate for condition and setting   Data Reviewed: CBC: Recent Labs  Lab 10/13/19 1544 10/13/19 1544 10/13/19 1651 10/13/19 1656 10/13/19 1922 10/14/19 0450 10/15/19 0407  WBC 14.3*  --   --   --  14.3* 11.3* 8.3  NEUTROABS 12.0*  --    --   --   --   --  5.2  HGB 13.6   < > 13.9 13.9 12.5 12.1 11.1*  HCT 42.8   < > 41.0 41.0 39.5 37.9 34.6*  MCV 92.0  --   --   --  91.9 92.4 92.5  PLT 203  --   --   --  195 169 161   < > = values in this interval not displayed.   Basic Metabolic Panel: Recent Labs  Lab 10/13/19 1544 10/13/19 1544 10/13/19 1651 10/13/19 1656 10/13/19 1922 10/14/19 0450 10/15/19 0407  NA 142  --  142 143  --  145 140  K 4.4  --  4.4 4.4  --  4.0 3.5  CL 106  --  109 108  --  113* 110  CO2 26  --   --   --   --  24 22  GLUCOSE 227*  --  227* 227*  --  185* 176*  BUN 38*  --  36* 36*  --  33* 20  CREATININE 0.76   < > 0.80 0.70 0.55 0.67 0.47  CALCIUM 10.1  --   --   --   --  9.5 8.9   < > = values in this interval not displayed.   GFR: CrCl cannot be calculated (Unknown ideal weight.). Liver Function Tests: Recent Labs  Lab 10/13/19 1544 10/14/19 0450 10/15/19 0407  AST 68* 56* 45*  ALT 41 35 32  ALKPHOS 86 69 62  BILITOT 1.4* 0.8 0.9  PROT 8.1 6.7 6.0*  ALBUMIN 4.1 3.3* 3.0*   No results for input(s): LIPASE, AMYLASE in the last 168 hours. No results for input(s): AMMONIA in the last 168 hours. Coagulation Profile: No results for input(s): INR, PROTIME in the last 168 hours. Cardiac Enzymes: Recent Labs  Lab 10/13/19 1544 10/14/19 0450  CKTOTAL 2,157* 1,504*   BNP (last 3 results) No results for input(s): PROBNP in the last 8760 hours. HbA1C: No results for input(s): HGBA1C in the last 72 hours. CBG: Recent Labs  Lab 10/14/19 1232 10/14/19 1605 10/15/19 0509 10/15/19 0754 10/15/19 1144  GLUCAP 161* 190* 178* 294* 199*   Lipid Profile: No results for input(s): CHOL, HDL, LDLCALC, TRIG, CHOLHDL, LDLDIRECT in the last 72 hours. Thyroid Function Tests: No results for input(s): TSH, T4TOTAL, FREET4, T3FREE, THYROIDAB in the last 72 hours. Anemia Panel: No results for input(s): VITAMINB12, FOLATE, FERRITIN, TIBC, IRON, RETICCTPCT in the last 72 hours. Urine  analysis:    Component Value Date/Time   COLORURINE AMBER (A) 10/13/2019 2205   APPEARANCEUR CLEAR 10/13/2019 2205   LABSPEC 1.025 10/13/2019 2205   PHURINE 6.0 10/13/2019 2205   GLUCOSEU >=500 (A) 10/13/2019 2205   HGBUR NEGATIVE 10/13/2019 2205   BILIRUBINUR NEGATIVE 10/13/2019 2205   BILIRUBINUR small (A) 01/05/2018 1232   KETONESUR 20 (A) 10/13/2019 2205   PROTEINUR 30 (A) 10/13/2019 2205   UROBILINOGEN 1.0 01/05/2018 1232   UROBILINOGEN 2.0 (H) 04/05/2014 1208   NITRITE NEGATIVE 10/13/2019 2205   LEUKOCYTESUR NEGATIVE 10/13/2019 2205   Sepsis Labs: @LABRCNTIP (procalcitonin:4,lacticidven:4)  ) Recent Results (from the past 240 hour(s))  Respiratory Panel by RT PCR (Flu A&B,  Covid) - Nasopharyngeal Swab     Status: None   Collection Time: 10/06/19  5:11 PM   Specimen: Nasopharyngeal Swab  Result Value Ref Range Status   SARS Coronavirus 2 by RT PCR NEGATIVE NEGATIVE Final    Comment: (NOTE) SARS-CoV-2 target nucleic acids are NOT DETECTED. The SARS-CoV-2 RNA is generally detectable in upper respiratoy specimens during the acute phase of infection. The lowest concentration of SARS-CoV-2 viral copies this assay can detect is 131 copies/mL. A negative result does not preclude SARS-Cov-2 infection and should not be used as the sole basis for treatment or other patient management decisions. A negative result may occur with  improper specimen collection/handling, submission of specimen other than nasopharyngeal swab, presence of viral mutation(s) within the areas targeted by this assay, and inadequate number of viral copies (<131 copies/mL). A negative result must be combined with clinical observations, patient history, and epidemiological information. The expected result is Negative. Fact Sheet for Patients:  PinkCheek.be Fact Sheet for Healthcare Providers:  GravelBags.it This test is not yet ap proved or cleared by  the [place] FDA and  has been authorized for detection and/or diagnosis of SARS-CoV-2 by FDA under an Emergency Use Authorization (EUA). This EUA will remain  in effect (meaning this test can be used) for the duration of the COVID-19 declaration under Section 564(b)(1) of the Act, 21 U.S.C. section 360bbb-3(b)(1), unless the authorization is terminated or revoked sooner.    Influenza A by PCR NEGATIVE NEGATIVE Final   Influenza B by PCR NEGATIVE NEGATIVE Final    Comment: (NOTE) The Xpert Xpress SARS-CoV-2/FLU/RSV assay is intended as an aid in  the diagnosis of influenza from Nasopharyngeal swab specimens and  should not be used as a sole basis for treatment. Nasal washings and  aspirates are unacceptable for Xpert Xpress SARS-CoV-2/FLU/RSV  testing. Fact Sheet for Patients: PinkCheek.be Fact Sheet for Healthcare Providers: GravelBags.it This test is not yet approved or cleared by the [place] FDA and  has been authorized for detection and/or diagnosis of SARS-CoV-2 by  FDA under an Emergency Use Authorization (EUA). This EUA will remain  in effect (meaning this test can be used) for the duration of the  Covid-19 declaration under Section 564(b)(1) of the Act, 21  U.S.C. section 360bbb-3(b)(1), unless the authorization is  terminated or revoked. Performed at [facility], [place] [address], [place], [place] [address]   Urine culture     Status: Abnormal   Collection Time: 10/08/19 11:00 AM   Specimen: Urine, Catheterized  Result Value Ref Range Status   Specimen Description   Final    URINE, CATHETERIZED Performed at [facility] [address], [place], [place] [address]    Special Requests   Final    NONE Performed at [facility], [place] [address] [place], [place] [address]    Culture (A)  Final    >=100,000 COLONIES/mL LACTOBACILLUS  SPECIES Standardized susceptibility testing for this organism is not available. Performed at [facility], [place] [address], [place], [place] [address]    Report Status 10/11/2019 FINAL  Final  Blood culture (routine x 2)     Status: None (Preliminary result)   Collection Time: 10/13/19  3:50 PM   Specimen: BLOOD  Result Value Ref Range Status   Specimen Description   Final    BLOOD RIGHT ANTECUBITAL Performed at [facility], [place] [address], [place],  [address]    Special Requests  Final    BOTTLES DRAWN AEROBIC AND ANAEROBIC Blood Culture adequate volume Performed at [facility] [address] [place], [place] [address]    Culture   Final    NO GROWTH 2 DAYS Performed at [facility] [address] [place], [place] [address]    Report Status PENDING  Incomplete  Blood culture (routine x 2)     Status: None (Preliminary result)   Collection Time: 10/13/19  4:29 PM   Specimen: BLOOD  Result Value Ref Range Status   Specimen Description   Final    BLOOD BLOOD RIGHT HAND Performed at [facility] [address], [place], [place] [address]    Special Requests   Final    BOTTLES DRAWN AEROBIC ONLY Blood Culture adequate volume Performed at [facility] [address] [place], [place] [address]    Culture   Final    NO GROWTH 2 DAYS Performed at [facility] [address] [place], [place] [address]    Report Status PENDING  Incomplete  Urine culture     Status: None   Collection Time: 10/13/19 10:04 PM   Specimen: Urine, Random  Result Value Ref Range Status   Specimen Description   Final    URINE, RANDOM Performed at [facility] [address], [place], [place] [address]    Special Requests   Final    NONE Performed at [facility], [place] [address] [place], [place] [address]    Culture   Final    NO GROWTH Performed at [facility], [place] [address], [place], [place] [address]    Report Status 10/15/2019 FINAL  Final      Studies: No results found.  Scheduled Meds: . Chlorhexidine Gluconate Cloth  6 each Topical Daily  . enoxaparin (LOVENOX) injection  40 mg Subcutaneous Q24H  . insulin aspart  0-5 Units Subcutaneous QHS    Continuous Infusions: . sodium chloride 100 mL/hr at 10/15/19 0530  . cefTRIAXone (ROCEPHIN)  IV 1 g (10/14/19 1856)     LOS: 2 days     Kayleen Memos, MD [facility] Pager [phone]  If 7PM-7AM, please contact night-coverage [email or web address] Password [facility] 10/15/2019, 12:29 PM

## 2019-10-15 NOTE — Consult Note (Signed)
   [facility] CM Inpatient Consult   10/15/2019  Jeanette Rose 1956-01-08 [id number]   Patient chart reviewed due to unplanned readmission risk score of 28%, high. Patient is currently pending for [facility] for chronic disease management services.  Patient had been referred by ED case manager due to frequent ED admissions.   Per chart review, current disposition plan is for SNF. Hospital liaison will continue to follow for progression and update community RN of patient admission and disposition.  Of note, [facility] Care Management services does not replace or interfere with any services that are needed or arranged by inpatient case management or social work.    Netta Cedars, MSN, [facility] Liaison Nurse Mobile Phone [phone]  Toll free office [phone]

## 2019-10-15 NOTE — TOC Initial Note (Signed)
Transition of Care [facility]) - Initial/Assessment Note    Patient Details  Name: Jeanette Rose MRN: [id number] Date of Birth: 1956/08/05  Transition of Care [facility]) CM/SW Contact:    Trish Mage, LCSW Phone Number: 10/15/2019, 10:11 AM  Clinical Narrative:    Patient with diagnosis of acute metabolic encephalopathy is recommended by PT/ OT for "long term SNF follow-up."   Upon approach, patient is oriented X4, anxious appearing as evidenced by halting speech with unusual jaw movements while speaking in squeaky voice stating she can't talk.  However, she was able to continue to communicate with me throughout interview process.  She states she does not want to go anywhere in response to question about going to rehab.  With her permission, called sister [see contact information below] who states that patient "has to go to long term placement because she cannot live alone.  "She is not eating nor drinking and she will die."  Asked sister about patient staying with family, and sister emphatically states that of the 4 people available [herself, 2 brothers and a daughter] "no one has any room for her."  Explained to sister patient must have payor source for long term, and sister states that [facility] RN told her patient would be eligible for MCD as payor source.  I asked about income, and patient gets approx. 2066.00 per month-well over the MCD cut off amount.   Patient describes herself as "very depressed and very anxious."  She states that things got a lot worse about a year ago when Dr Toy Care, local psychiatrist, stated she would need to be weaned off Ativan. She currently has no psychiatrist, and she is not being prescribed any psychiatric medications here currently. Will send bed search out to SNF's for rehab.            Expected Discharge Plan: Skilled Nursing Facility Barriers to Discharge: SNF Pending bed offer   Patient Goals and CMS Choice Patient states their goals for this hospitalization and ongoing  recovery are:: "I don't want to go anywhere. I want to lay here and die." CMS Medicare.gov Compare Post Acute Care list provided to:: Patient Choice offered to / list presented to : Patient, Sibling  Expected Discharge Plan and Services Expected Discharge Plan: [place]   Discharge Planning Services: CM Consult Post Acute Care Choice: [place] Living arrangements for the past 2 months: Single Family Home                                      Prior Living Arrangements/Services Living arrangements for the past 2 months: Single Family Home Lives with:: Self Patient language and need for interpreter reviewed:: Yes Do you feel safe going back to the place where you live?: Yes      Need for Family Participation in Patient Care: Yes (Comment) Care giver support system in place?: Yes (comment)   Criminal Activity/Legal Involvement Pertinent to Current Situation/Hospitalization: No - Comment as needed  Activities of Daily Living Home Assistive Devices/Equipment: None ADL Screening (condition at time of admission) Patient's cognitive ability adequate to safely complete daily activities?: No Is the patient deaf or have difficulty hearing?: No Does the patient have difficulty seeing, even when wearing glasses/contacts?: No Does the patient have difficulty concentrating, remembering, or making decisions?: No Patient able to express need for assistance with ADLs?: Yes Does the patient have difficulty dressing or bathing?: Yes  Independently performs ADLs?: No Communication: Independent Dressing (OT): Needs assistance Grooming: Needs assistance Feeding: Needs assistance Bathing: Needs assistance Toileting: Needs assistance In/Out Bed: Needs assistance Walks in Home: Needs assistance Does the patient have difficulty walking or climbing stairs?: Yes Weakness of Legs: Both Weakness of Arms/Hands: Both  Permission Sought/Granted Permission sought to  share information with : Family Supports Permission granted to share information with : Yes, Verbal Permission Granted  Share Information with NAME: [name]     Permission granted to share info w Relationship: sister  Permission granted to share info w Contact Information: [phone]  Emotional Assessment Appearance:: Appears stated age Attitude/Demeanor/Rapport: Engaged Affect (typically observed): Anxious Orientation: : Oriented to Self, Oriented to Place, Oriented to  Time, Oriented to Situation Alcohol / Substance Use: Not Applicable Psych Involvement: No (comment)  Admission diagnosis:  Sepsis secondary to UTI ([place]) [A41.9, N39.0] Non-traumatic rhabdomyolysis [M62.82] Patient Active Problem List   Diagnosis Date Noted  . Elevated CPK 10/14/2019  . Sacral decubitus ulcer, stage II ([place]) 10/14/2019  . Acute metabolic encephalopathy 54/27/0623  . Sepsis secondary to UTI ([place]) 10/13/2019  . Adjustment disorder with anxiety 10/08/2019  . Persistent depressive disorder with anxious distress, currently severe 09/16/2019  . MDD (major depressive disorder), recurrent severe, without psychosis ([place]) 09/11/2019  . Major depression 03/15/2019  . Severe recurrent major depression without psychotic features ([place]) 03/15/2019  . Generalized anxiety disorder 03/04/2019  . Benzodiazepine dependence, continuous ([place]) 03/04/2019  . MDD (major depressive disorder), recurrent episode, severe ([place]) 03/04/2019  . Positive colorectal cancer screening using Cologuard test 02/15/2018  . Seborrheic dermatitis   . History of hypothyroidism 10/26/2017  . Elevated ferritin level 10/26/2017  . Vitamin D deficiency 10/26/2017  . Diabetic peripheral neuropathy associated with type 2 diabetes mellitus ([place]) 10/26/2017  . OSA (obstructive sleep apnea)   . Hyperlipidemia LDL goal <100   . Major depressive disorder, recurrent episode ([place])   . Constipation 04/22/2014  . Steatohepatitis 04/03/2014  .  Anxiety   . Type 2 diabetes mellitus with diabetic polyneuropathy, with long-term current use of insulin ([place])   . GERD (gastroesophageal reflux disease)   . Hypertension   . Obesity (BMI 30-39.9)    PCP:  Salvatore Marvel, PA-C Pharmacy:   [id number] [place], [place] [address]Phone: [phone]: [phone] [id number]: [phone]: [phone] [place] [id number]: [phone]: [phone]    Social Determinants of Health (SDOH) Interventions    Readmission Risk Interventions No flowsheet data found.

## 2019-10-15 NOTE — Progress Notes (Signed)
Pt with no urine output since 1500 I&O cath of 625cc. Bladderscans at 2200 & 2400 show <250cc. Pts mental state of stating "I cant"to anything you ask of her may be playing a role in this or may be UTI related. Amion text to [facility]. Obtained order for foley cath.

## 2019-10-16 DIAGNOSIS — F331 Major depressive disorder, recurrent, moderate: Secondary | ICD-10-CM

## 2019-10-16 DIAGNOSIS — F411 Generalized anxiety disorder: Secondary | ICD-10-CM

## 2019-10-16 LAB — CBC
HCT: 32.1 % — ABNORMAL LOW (ref 36.0–46.0)
Hemoglobin: 10.5 g/dL — ABNORMAL LOW (ref 12.0–15.0)
MCH: 29.3 pg (ref 26.0–34.0)
MCHC: 32.7 g/dL (ref 30.0–36.0)
MCV: 89.7 fL (ref 80.0–100.0)
Platelets: 165 10*3/uL (ref 150–400)
RBC: 3.58 MIL/uL — ABNORMAL LOW (ref 3.87–5.11)
RDW: 14.2 % (ref 11.5–15.5)
WBC: 6.3 10*3/uL (ref 4.0–10.5)
nRBC: 0 % (ref 0.0–0.2)

## 2019-10-16 LAB — BASIC METABOLIC PANEL
Anion gap: 7 (ref 5–15)
BUN: 9 mg/dL (ref 8–23)
CO2: 23 mmol/L (ref 22–32)
Calcium: 8.5 mg/dL — ABNORMAL LOW (ref 8.9–10.3)
Chloride: 108 mmol/L (ref 98–111)
Creatinine, Ser: 0.5 mg/dL (ref 0.44–1.00)
GFR calc Af Amer: >60 mL/min (ref >60)
GFR calc non Af Amer: >60 mL/min (ref >60)
Glucose, Bld: 153 mg/dL — ABNORMAL HIGH (ref 70–99)
Potassium: 3 mmol/L — ABNORMAL LOW (ref 3.5–5.1)
Sodium: 138 mmol/L (ref 135–145)

## 2019-10-16 LAB — GLUCOSE, CAPILLARY
Glucose-Capillary: 149 mg/dL — ABNORMAL HIGH (ref 70–99)
Glucose-Capillary: 165 mg/dL — ABNORMAL HIGH (ref 70–99)
Glucose-Capillary: 143 mg/dL — ABNORMAL HIGH (ref 70–99)
Glucose-Capillary: 142 mg/dL — ABNORMAL HIGH (ref 70–99)

## 2019-10-16 LAB — MAGNESIUM: Magnesium: 1.5 mg/dL — ABNORMAL LOW (ref 1.7–2.4)

## 2019-10-16 LAB — PHOSPHORUS: Phosphorus: 2.7 mg/dL (ref 2.5–4.6)

## 2019-10-16 LAB — VITAMIN B12: Vitamin B-12: 431 pg/mL (ref 180–914)

## 2019-10-16 MED ORDER — MAGNESIUM SULFATE 4 GM/100ML IV SOLN
4.0000 g | Freq: Once | INTRAVENOUS | Status: AC
  Administered 2019-10-16: 4 g via INTRAVENOUS
  Filled 2019-10-16: qty 100

## 2019-10-16 MED ORDER — SERTRALINE HCL 25 MG PO TABS
25.0000 mg | ORAL_TABLET | Freq: Every day | ORAL | Status: DC
  Administered 2019-10-17 – 2019-10-18 (×2): 25 mg via ORAL
  Filled 2019-10-16 (×2): qty 1

## 2019-10-16 MED ORDER — POTASSIUM CHLORIDE 10 MEQ/100ML IV SOLN
10.0000 meq | INTRAVENOUS | Status: AC
  Administered 2019-10-16 – 2019-10-17 (×4): 10 meq via INTRAVENOUS
  Filled 2019-10-16 (×3): qty 100

## 2019-10-16 MED ORDER — HYDROXYZINE HCL 10 MG PO TABS
10.0000 mg | ORAL_TABLET | Freq: Three times a day (TID) | ORAL | Status: DC | PRN
  Filled 2019-10-16: qty 1

## 2019-10-16 NOTE — NC FL2 (Signed)
[place] LEVEL OF CARE SCREENING TOOL     IDENTIFICATION  Patient Name: Jeanette Rose Birthdate: 1956/08/15 Sex: female Admission Date (Current Location): 10/13/2019  [place] [facility] and [place] Number:  Herbalist and Address:  [facility],  [place], [place]      Provider Number: [id number]  Attending Physician Name and Address:  Kayleen Memos, DO  Relative Name and Phone Number:  [name] (sister) [phone]    Current Level of Care: Hospital Recommended Level of Care: [place] Prior Approval Number:    Date Approved/Denied:   PASRR Number: [id number]  Discharge Plan: SNF    Current Diagnoses: Patient Active Problem List   Diagnosis Date Noted  . Elevated CPK 10/14/2019  . Sacral decubitus ulcer, stage II ([place]) 10/14/2019  . Acute metabolic encephalopathy 26/41/5830  . Sepsis secondary to UTI ([place]) 10/13/2019  . Generalized anxiety disorder 03/04/2019  . Benzodiazepine dependence, continuous ([place]) 03/04/2019  . MDD (major depressive disorder), recurrent episode, severe ([place]) 03/04/2019  . Positive colorectal cancer screening using Cologuard test 02/15/2018  . Seborrheic dermatitis   . History of hypothyroidism 10/26/2017  . Elevated ferritin level 10/26/2017  . Vitamin D deficiency 10/26/2017  . Diabetic peripheral neuropathy associated with type 2 diabetes mellitus ([place]) 10/26/2017  . OSA (obstructive sleep apnea)   . Hyperlipidemia LDL goal <100   . Major depressive disorder, recurrent episode, moderate ([place])   . Constipation 04/22/2014  . Steatohepatitis 04/03/2014  . Anxiety   . Type 2 diabetes mellitus with diabetic polyneuropathy, with long-term current use of insulin ([place])   . GERD (gastroesophageal reflux disease)   . Hypertension   . Obesity (BMI 30-39.9)     Orientation RESPIRATION BLADDER Height & Weight     Self, Time, Situation, Place  Normal Indwelling catheter Weight:  77.4 kg Height:  5' 4"  (162.6 cm)  BEHAVIORAL SYMPTOMS/MOOD NEUROLOGICAL BOWEL NUTRITION STATUS  (none) (none) Continent Diet(see d/c summary)  AMBULATORY STATUS COMMUNICATION OF NEEDS Skin   Extensive Assist Verbally Normal                       Personal Care Assistance Level of Assistance  Bathing, Feeding, Dressing Bathing Assistance: Limited assistance Feeding assistance: Independent Dressing Assistance: Limited assistance     Functional Limitations Info  Sight, Hearing, Speech Sight Info: Adequate Hearing Info: Adequate Speech Info: Adequate    SPECIAL CARE FACTORS FREQUENCY  PT (By licensed PT), OT (By licensed OT)     PT Frequency: 5X/W OT Frequency: 5X/W            Contractures Contractures Info: Not present    Additional Factors Info  Code Status Code Status Info: full Allergies Info: Frarxiga, Rexulti, Trintellix           Current Medications (10/16/2019):  This is the current hospital active medication list Current Facility-Administered Medications  Medication Dose Route Frequency Provider Last Rate Last Admin  . 0.9 %  sodium chloride infusion   Intravenous Continuous Bunnie Pion Z, DO 100 mL/hr at 10/16/19 0216 New Bag at 10/16/19 0216  . acetaminophen (TYLENOL) tablet 650 mg  650 mg Oral Q6H PRN Bunnie Pion Z, DO       Or  . acetaminophen (TYLENOL) suppository 650 mg  650 mg Rectal Q6H PRN Bunnie Pion Z, DO      . cefTRIAXone (ROCEPHIN) 1 g in sodium chloride 0.9 % 100 mL IVPB  1 g Intravenous  Q24H Bunnie Pion Z, DO 200 mL/hr at 10/15/19 1833 1 g at 10/15/19 1833  . Chlorhexidine Gluconate Cloth 2 % PADS 6 each  6 each Topical Daily Donne Hazel, MD   6 each at 10/15/19 0630  . enoxaparin (LOVENOX) injection 40 mg  40 mg Subcutaneous Q24H Bunnie Pion Z, DO   40 mg at 10/15/19 2123  . insulin aspart (novoLOG) injection 0-5 Units  0-5 Units Subcutaneous QHS Bunnie Pion Z, DO   Stopped at 10/14/19 2223  . ondansetron (ZOFRAN)  tablet 4 mg  4 mg Oral Q6H PRN Bunnie Pion Z, DO       Or  . ondansetron [facility]) injection 4 mg  4 mg Intravenous Q6H PRN Edmonia Lynch, DO         Discharge Medications: Please see discharge summary for a list of discharge medications.  Relevant Imaging Results:  Relevant Lab Results:   Additional Information SS# [id number]  Trish Mage, LCSW

## 2019-10-16 NOTE — Progress Notes (Signed)
Physical Therapy Treatment Patient Details Name: Jeanette Rose MRN: [id number] DOB: 1955/12/15 Today's Date: 10/16/2019    History of Present Illness Jeanette Rose is a 64 y.o. female with medical history significant of hypertension, diabetes mellitus, anxiety/depression and anemia is brought to ED by EMS for evaluation of altered mental status.     PT Comments    Pt quick to state "I can't" and has decreased insight into her actual abilities due to declining to attempt to try to participate with mobility.  Pt encouraged to (at least) perform exercises and visually demonstrated for pt which she did perform in supine in bed.   Psychiatry in to see pt end of session.   Follow Up Recommendations  SNF;Supervision/Assistance - 24 hour     Equipment Recommendations  None recommended by PT    Recommendations for Other Services       Precautions / Restrictions Precautions Precautions: Fall    Mobility  Bed Mobility               General bed mobility comments: pt would not mobilize despite benefits which were stated multiple times  Transfers                    Ambulation/Gait                 Stairs             Wheelchair Mobility    Modified Rankin (Stroke Patients Only)       Balance                                            Cognition Arousal/Alertness: Awake/alert Behavior During Therapy: Flat affect Overall Cognitive Status: No family/caregiver present to determine baseline cognitive functioning                                 General Comments: pt requries max encouragement for participation during session but follows cues consistently, when prompted to do something pt will often reply with "I can't"; significant self limiting behaviors and decreased awareness of abilities, poor self-awareness, significantly debilitating mental health limitations impacting her independence with self-care      Exercises  Total Joint Exercises Ankle Circles/Pumps: AROM;Both;5 reps Quad Sets: AROM;Both;5 reps Heel Slides: AROM;Both;5 reps General Exercises - Upper Extremity Shoulder Flexion: AROM;Both;5 reps(to tolerance, pt reports bad rotator cuff however lifting to at least 90*) Elbow Flexion: AROM;Both;5 reps Elbow Extension: AROM;Both;5 reps    General Comments        Pertinent Vitals/Pain Pain Assessment: Faces Faces Pain Scale: No hurt    Home Living                      Prior Function            PT Goals (current goals can now be found in the care plan section) Progress towards PT goals: Not progressing toward goals - comment(self limiting)    Frequency    Min 2X/week      PT Plan Current plan remains appropriate    Co-evaluation              AM-PAC PT "6 Clicks" Mobility   Outcome Measure  Help needed turning from your back to your side while in a flat bed without  using bedrails?: A Lot Help needed moving from lying on your back to sitting on the side of a flat bed without using bedrails?: A Lot Help needed moving to and from a bed to a chair (including a wheelchair)?: A Lot Help needed standing up from a chair using your arms (e.g., wheelchair or bedside chair)?: A Lot Help needed to walk in hospital room?: A Lot Help needed climbing 3-5 steps with a railing? : Total 6 Click Score: 11    End of Session   Activity Tolerance: Other (comment)(self limiting) Patient left: in bed;with call bell/phone within reach;with bed alarm set   PT Visit Diagnosis: Muscle weakness (generalized) (M62.81);Difficulty in walking, not elsewhere classified (R26.2)     Time: 0092-3300 PT Time Calculation (min) (ACUTE ONLY): 20 min  Charges:  $Therapeutic Exercise: 8-22 mins                     Jeanette Rose, DPT Acute Rehabilitation Services Office: [phone]  Jeanette Rose 10/16/2019, 4:14 PM

## 2019-10-16 NOTE — Care Management Important Message (Signed)
Important Message  Patient Details IM Letter given to Roque Lias SW Case Manager to present to the Patient Name: Jeanette Rose MRN: [id number] Date of Birth: 03/29/1956   Medicare Important Message Given:  Yes     Kerin Salen 10/16/2019, 12:53 PM

## 2019-10-16 NOTE — Consult Note (Signed)
[facility] Consult   Reason for Consult:  Depression and anxiety Referring Physician:  Dr Irene Pap Patient Identification: Jeanette Rose MRN:  [id number] Principal Diagnosis: Acute metabolic encephalopathy Diagnosis:  Principal Problem:   Acute metabolic encephalopathy Active Problems:   MDD (major depressive disorder), recurrent episode, severe ([place])   Type 2 diabetes mellitus with diabetic polyneuropathy, with long-term current use of insulin ([facility])   Major depressive disorder, recurrent episode, moderate ([place])   Sepsis secondary to UTI ([place])   Elevated CPK   Sacral decubitus ulcer, stage II ([place])   Total Time spent with patient: 1 hour  Subjective:   Jeanette Rose is a 64 y.o. female patient admitted with UTI/sepsis.  Patient seen and evaluated in person by this provider.  She does report depression and anxiety both at a moderate level.  During the assessment she became tearful and said that "there is no way to take them without spilling them."  She feels that she cannot take care of herself and is transferring to rehab after discharge.  She has a long history of depression with ECT noted in June.  Denies suicidal/homicidal ideations, hallucinations, and substance abuse.  Treatment recommendations below.  10/07/18, note from ED:  Jeanette Rose, 64 y.o., female patient seen via tele psych by this provider, Dr. Dwyane Dee; and chart reviewed on 10/08/19.  On evaluation Jeanette Rose reports that she lives alone and that she is unable to care for herself at home any longer.  Patient states that she does have anxiety.  "  I have anxiety reviewed bed.  I just cannot get up and do anything for myself anymore."  At this time patient denies suicidal/self-harm/homicidal ideations, psychosis, paranoia.  Patient does have a sister who has been trying to assist her in getting into a skilled nursing facility. During evaluation Jeanette Rose is alert/oriented x 4; calm/cooperative; and mood is  anxious which is congruent with affect.  She does not appear to be responding to internal/external stimuli or delusional thoughts.  Patient denies suicidal/self-harm/homicidal ideation, psychosis, and paranoia.  Patient answered question appropriately.  Patient worsening anxiety related to unable to care for self at home any longer.  Referral to social work to assist patient and family with skilled nursing placement.  HPI per MD:  Jeanette Rose is a 64 y.o. female with medical history significant of hypertension, diabetes mellitus, anxiety/depression and anemia is brought to ED by EMS for evaluation of altered mental status.  Patient is stable confused and hardly able to answer all the questions appropriately and the speech is very difficult to understand because patient speaks very slow and mumbles.  Most of the history is gathered from ED physician and medical records.  Patient was seen in the ED 5 days ago for anxiety/depression and found to have UTI and was discharged home with antibiotics but patient did not use the antibiotics as per ED physician.  As per ED physician patient was in severe psychosis and refused to see her family members for 3 days and the family members contacted the home health.  ED physician further mentioned that the patient's sister reported that the home health found the patient in psychosis and she was in bed covered that had and the home health called EMS and patient is brought to ED for further evaluation.  During my evaluation, the patient was still confused and her speech was poor but she was able to say yes and no on most of the questions.  Patient admitted of feeling  very weak and was unable to move from the bed and she also admitted of having urinary difficulty.  According to the ED physician patient is living alone by herself.  Past Psychiatric History: depression, anxiety  Risk to Self:  none Risk to Others:  none Prior Inpatient Therapy:  [facility] Prior Outpatient Therapy:   yes  Past Medical History:  Past Medical History:  Diagnosis Date  . Acute acalculous cholecystitis s/p lap chole 04/03/2014 04/02/2014  . Allergy   . Anemia   . Anxiety   . Asthma   . Depression   . Fatty liver    noted on CT per [facility] records  . Fibromyalgia   . GERD (gastroesophageal reflux disease)   . Hyperlipidemia associated with type 2 diabetes mellitus ([place])   . Hypertension   . Hypothyroid 2015   Transiently in 2015. Synthroid 74mg was stopped 05/07/2016 w/ tsh nml following.  . Obesity (BMI 30-39.9)   . OSA (obstructive sleep apnea)    non-compliant with CPAP  . Seborrheic dermatitis    [facility]  . Stroke ([facility]    TIA  . Type 2 diabetes mellitus ([place]     Past Surgical History:  Procedure Laterality Date  . BREAST REDUCTION SURGERY  06/2008  . CHOLECYSTECTOMY N/A 04/03/2014   Procedure: LAPAROSCOPIC CHOLECYSTECTOMY ;  Surgeon: SAdin Hector MD;  Location: [facility] ORS;  Service: General;  Laterality: N/A;  . REDUCTION MAMMAPLASTY    . TUBAL LIGATION     Family History:  Family History  Problem Relation Age of Onset  . Atrial fibrillation Mother   . Heart disease Father        CAD  . Diabetes Brother   . Cancer Maternal Grandmother   . Diabetes Paternal Grandmother   . Fibromyalgia Sister   . Colitis Sister    Family Psychiatric  History: none Social History:  Social History   Substance and Sexual Activity  Alcohol Use No     Social History   Substance and Sexual Activity  Drug Use No    Social History   Socioeconomic History  . Marital status: Divorced    Spouse name: Not on file  . Number of children: 2  . Years of education: Not on file  . Highest education level: GED or equivalent  Occupational History  . Occupation: Disability  Tobacco Use  . Smoking status: Former Smoker    Packs/day: 4.00    Years: 15.00    Pack years: 60.00    Types: Cigarettes    Quit date: 04/22/2004    Years since  quitting: 15.4  . Smokeless tobacco: Never Used  . Tobacco comment: 4 pack year hx  Substance and Sexual Activity  . Alcohol use: No  . Drug use: No  . Sexual activity: Not Currently  Other Topics Concern  . Not on file  Social History Narrative   Pt lives alone in   Social Determinants of Health   Financial Resource Strain:   . Difficulty of Paying Living Expenses: Not on file  Food Insecurity:   . Worried About RCharity fundraiserin the Last Year: Not on file  . Ran Out of Food in the Last Year: Not on file  Transportation Needs:   . Lack of Transportation (Medical): Not on file  . Lack of Transportation (Non-Medical): Not on file  Physical Activity:   . Days of Exercise per Week: Not on file  . Minutes  of Exercise per Session: Not on file  Stress:   . Feeling of Stress : Not on file  Social Connections:   . Frequency of Communication with Friends and Family: Not on file  . Frequency of Social Gatherings with Friends and Family: Not on file  . Attends Religious Services: Not on file  . Active Member of Clubs or Organizations: Not on file  . Attends Archivist Meetings: Not on file  . Marital Status: Not on file   Additional Social History:    Allergies:   Allergies  Allergen Reactions  . Farxiga [Dapagliflozin] Anxiety and Other (See Comments)    Made depression and anxiety worse  . Rexulti [Brexpiprazole] Anxiety and Other (See Comments)    Made anxiety worse  . Trintellix [Vortioxetine] Anxiety and Other (See Comments)    Made anxiety worse    Labs:  Results for orders placed or performed during the hospital encounter of 10/13/19 (from the past 48 hour(s))  Glucose, capillary     Status: Abnormal   Collection Time: 10/14/19 12:32 PM  Result Value Ref Range   Glucose-Capillary 161 (H) 70 - 99 mg/dL  Glucose, capillary     Status: Abnormal   Collection Time: 10/14/19  4:05 PM  Result Value Ref Range   Glucose-Capillary 190 (H) 70 - 99 mg/dL  CBC  with Differential/Platelet     Status: Abnormal   Collection Time: 10/15/19  4:07 AM  Result Value Ref Range   WBC 8.3 4.0 - 10.5 K/uL   RBC 3.74 (L) 3.87 - 5.11 MIL/uL   Hemoglobin 11.1 (L) 12.0 - 15.0 g/dL   HCT 34.6 (L) 36.0 - 46.0 %   MCV 92.5 80.0 - 100.0 fL   MCH 29.7 26.0 - 34.0 pg   MCHC 32.1 30.0 - 36.0 g/dL   RDW 14.9 11.5 - 15.5 %   Platelets 161 150 - 400 K/uL   nRBC 0.0 0.0 - 0.2 %   Neutrophils Relative % 62 %   Neutro Abs 5.2 1.7 - 7.7 K/uL   Lymphocytes Relative 29 %   Lymphs Abs 2.4 0.7 - 4.0 K/uL   Monocytes Relative 5 %   Monocytes Absolute 0.5 0.1 - 1.0 K/uL   Eosinophils Relative 3 %   Eosinophils Absolute 0.2 0.0 - 0.5 K/uL   Basophils Relative 1 %   Basophils Absolute 0.1 0.0 - 0.1 K/uL   Immature Granulocytes 0 %   Abs Immature Granulocytes 0.02 0.00 - 0.07 K/uL    Comment: Performed at [facility], [place] [address], [place], [place] [address]  Comprehensive metabolic panel     Status: Abnormal   Collection Time: 10/15/19  4:07 AM  Result Value Ref Range   Sodium 140 135 - 145 mmol/L   Potassium 3.5 3.5 - 5.1 mmol/L   Chloride 110 98 - 111 mmol/L   CO2 22 22 - 32 mmol/L   Glucose, Bld 176 (H) 70 - 99 mg/dL   BUN 20 8 - 23 mg/dL   Creatinine, Ser 0.47 0.44 - 1.00 mg/dL   Calcium 8.9 8.9 - 10.3 mg/dL   Total Protein 6.0 (L) 6.5 - 8.1 g/dL   Albumin 3.0 (L) 3.5 - 5.0 g/dL   AST 45 (H) 15 - 41 U/L   ALT 32 0 - 44 U/L   Alkaline Phosphatase 62 38 - 126 U/L   Total Bilirubin 0.9 0.3 - 1.2 mg/dL   GFR calc non Af Amer >60 >60 mL/min  GFR calc Af Amer >60 >60 mL/min   Anion gap 8 5 - 15    Comment: Performed at [facility], [place] [address] [place], [place] [address]  Glucose, capillary     Status: Abnormal   Collection Time: 10/15/19  5:09 AM  Result Value Ref Range   Glucose-Capillary 178 (H) 70 - 99 mg/dL  Glucose, capillary     Status: Abnormal   Collection Time: 10/15/19  7:54 AM  Result Value Ref Range    Glucose-Capillary 294 (H) 70 - 99 mg/dL   Comment 1 Notify RN    Comment 2 Document in Chart   Glucose, capillary     Status: Abnormal   Collection Time: 10/15/19 11:44 AM  Result Value Ref Range   Glucose-Capillary 199 (H) 70 - 99 mg/dL   Comment 1 Notify RN    Comment 2 Document in Chart   Glucose, capillary     Status: Abnormal   Collection Time: 10/15/19  4:53 PM  Result Value Ref Range   Glucose-Capillary 177 (H) 70 - 99 mg/dL   Comment 1 Notify RN    Comment 2 Document in Chart   Glucose, capillary     Status: Abnormal   Collection Time: 10/15/19  7:57 PM  Result Value Ref Range   Glucose-Capillary 147 (H) 70 - 99 mg/dL  CBC     Status: Abnormal   Collection Time: 10/16/19  5:24 AM  Result Value Ref Range   WBC 6.3 4.0 - 10.5 K/uL   RBC 3.58 (L) 3.87 - 5.11 MIL/uL   Hemoglobin 10.5 (L) 12.0 - 15.0 g/dL   HCT 32.1 (L) 36.0 - 46.0 %   MCV 89.7 80.0 - 100.0 fL   MCH 29.3 26.0 - 34.0 pg   MCHC 32.7 30.0 - 36.0 g/dL   RDW 14.2 11.5 - 15.5 %   Platelets 165 150 - 400 K/uL   nRBC 0.0 0.0 - 0.2 %    Comment: Performed at [facility], [place] [address] [place], [place] [address]  Basic metabolic panel     Status: Abnormal   Collection Time: 10/16/19  5:24 AM  Result Value Ref Range   Sodium 138 135 - 145 mmol/L   Potassium 3.0 (L) 3.5 - 5.1 mmol/L   Chloride 108 98 - 111 mmol/L   CO2 23 22 - 32 mmol/L   Glucose, Bld 153 (H) 70 - 99 mg/dL   BUN 9 8 - 23 mg/dL   Creatinine, Ser 0.50 0.44 - 1.00 mg/dL   Calcium 8.5 (L) 8.9 - 10.3 mg/dL   GFR calc non Af Amer >60 >60 mL/min   GFR calc Af Amer >60 >60 mL/min   Anion gap 7 5 - 15    Comment: Performed at [facility], [place] [address], [place], [place] [address]  Magnesium     Status: Abnormal   Collection Time: 10/16/19  5:24 AM  Result Value Ref Range   Magnesium 1.5 (L) 1.7 - 2.4 mg/dL    Comment: Performed at [facility], [place] [address] [place], [place] [address]   Phosphorus     Status: None   Collection Time: 10/16/19  5:24 AM  Result Value Ref Range   Phosphorus 2.7 2.5 - 4.6 mg/dL    Comment: Performed at [facility], [place] [address], [place], [place] [address]  Vitamin B12     Status: None   Collection Time: 10/16/19  5:24 AM  Result Value Ref Range   Vitamin B-12 431 180 - 914 pg/mL    Comment: (NOTE) This assay is not validated for testing neonatal or myeloproliferative syndrome specimens for Vitamin B12 levels. Performed at [facility], [place] [address] [place], [place] [address]   Glucose, capillary     Status: Abnormal   Collection Time: 10/16/19  7:43 AM  Result Value Ref Range   Glucose-Capillary 149 (H) 70 - 99 mg/dL    Current Facility-Administered Medications  Medication Dose Route Frequency Provider Last Rate Last Admin  . 0.9 %  sodium chloride infusion   Intravenous Continuous Bunnie Pion Z, DO 100 mL/hr at 10/16/19 0216 New Bag at 10/16/19 0216  . acetaminophen (TYLENOL) tablet 650 mg  650 mg Oral Q6H PRN Bunnie Pion Z, DO       Or  . acetaminophen (TYLENOL) suppository 650 mg  650 mg Rectal Q6H PRN Bunnie Pion Z, DO      . cefTRIAXone (ROCEPHIN) 1 g in sodium chloride 0.9 % 100 mL IVPB  1 g Intravenous Q24H Bunnie Pion Z, DO 200 mL/hr at 10/15/19 1833 1 g at 10/15/19 1833  . Chlorhexidine Gluconate Cloth 2 % PADS 6 each  6 each Topical Daily Donne Hazel, MD   6 each at 10/15/19 0630  . enoxaparin (LOVENOX) injection 40 mg  40 mg Subcutaneous Q24H Bunnie Pion Z, DO   40 mg at 10/15/19 2123  . insulin aspart (novoLOG) injection 0-5 Units  0-5 Units Subcutaneous QHS Bunnie Pion Z, DO   Stopped at 10/14/19 2223  . ondansetron (ZOFRAN) tablet 4 mg  4 mg Oral Q6H PRN Bunnie Pion Z, DO       Or  . ondansetron [facility]) injection 4 mg  4 mg Intravenous Q6H PRN Edmonia Lynch, DO        Musculoskeletal: Strength & Muscle Tone: decreased Gait & Station: Did not  witness Patient leans: N/A  Psychiatric Specialty Exam: Physical Exam  Nursing note and vitals reviewed. Constitutional: She is oriented to person, place, and time. She appears well-developed and well-nourished.  HENT:  Head: Normocephalic.  Respiratory: Effort normal.  Musculoskeletal:        General: Normal range of motion.     Cervical back: Normal range of motion.  Neurological: She is alert and oriented to person, place, and time.  Psychiatric: Her speech is normal and behavior is normal. Judgment and thought content normal. Her mood appears anxious. Cognition and memory are normal. She exhibits a depressed mood.    Review of Systems  Psychiatric/Behavioral: Positive for dysphoric mood. The patient is nervous/anxious.   All other systems reviewed and are negative.   Blood pressure (!) 153/78, pulse 62, temperature 98.1 F (36.7 C), temperature source Oral, resp. rate 20, height 5' 4"  (1.626 m), weight 77.4 kg, SpO2 98 %.Body mass index is 29.29 kg/m.  General Appearance: Casual  Eye Contact:  Good  Speech:  Normal Rate  Volume:  Decreased  Mood:  Anxious and Depressed  Affect:  Congruent  Thought Process:  Coherent and Descriptions of Associations: Intact  Orientation:  Full (Time, Place, and Person)  Thought Content:  Rumination  Suicidal Thoughts:  No  Homicidal Thoughts:  No  Memory:  Immediate;   Fair Recent;   Fair Remote;   Fair  Judgement:  Fair  Insight:  Fair  Psychomotor Activity:  Decreased  Concentration:  Concentration: Fair and Attention Span: Fair  Recall:  [employer] of  Knowledge:  Fair  Language:  Fair  Akathisia:  No  Handed:  Right  AIMS (if indicated):     Assets:  Resilience Social Support  ADL's:  Impaired  Cognition:  WNL  Sleep:      64 year old female with a long history of depression and anxiety.  She feels that she can no longer take care of her self physically or manage her medications.  No suicidal/homicidal ideations,  hallucinations, or substance abuse.  Recommendations below.  Treatment Plan Summary: Major depressive disorder, recurrent, moderate: -Recommend starting Zoloft 25 mg daily  Anxiety: -Recommend hydroxyzine 10 mg 3 times daily as needed   Disposition: No evidence of imminent risk to self or others at present.    Waylan Boga, NP 10/16/2019 10:07 AM

## 2019-10-16 NOTE — Progress Notes (Signed)
PROGRESS NOTE  Jeanette Rose UDJ:[id number] DOB: November 07, 1955 DOA: 10/13/2019 PCP: Salvatore Marvel, PA-C  HPI/Recap of past 24 hours: Jeanette Rose a 64 y.o.femalewith medical history significant ofhypertension, type II diabetes mellitus, chronic anxiety/depression who was brought to ED by EMS for evaluation of altered mental status.  She lives alone.  Recently admitted and treated for UTI.  UA positive for pyuria and CT head nonacute.  Admitted for presumed UTI.  Cultures no growth to date.  Started empirically on Rocephin.  Had urinary retention overnight and a Foley catheter was placed.    10/16/19: Seen and examined.  A little more verbal today but with a depressed mood.  Seen by psychiatry with recommendations.  Appreciate psych assistance.   Assessment/Plan: Principal Problem:   Acute metabolic encephalopathy Active Problems:   Type 2 diabetes mellitus with diabetic polyneuropathy, with long-term current use of insulin (HCC)   Major depressive disorder, recurrent episode, moderate (HCC)   Sepsis secondary to UTI (HCC)   Elevated CPK   Sacral decubitus ulcer, stage II ([place])  Sepsis secondary to presumed UTI Presented with leukocytosis, tachycardia, tachypnea and positive UA Recently treated for UTI during previous admission, urine culture no growth. Was started on empiric antibiotics with improvement of symptomatology Blood cultures negative to date.  Resolving acute metabolic encephalopathy likely multifactorial secondary to acute illness, dehydration versus others CT head nonacute. Today at the time of this visit she is alert and oriented x3.  Chronic depression/anxiety Seen by psych Recommended Zoloft 25 mg daily and hydroxyzine 10 mg 3 times daily as needed for anxiety  Refractory hypokalemia Potassium 3.0 Repleted with IV potassium supplement Repeat BMP in the morning  Hypomagnesemia Magnesium 1.5 Repleted with 4 g IV magnesium  Dehydration Improved with IV  fluid hydration  Elevated CPK Presented with CPK greater than 1500 CPK is trending down Continue IV fluid hydration Repeat CPK level in the morning  Type 2 diabetes with hyperglycemia Hemoglobin A1c 9.9 on 09/12/2019. Continue insulin coverage  Physical debility PT assessment recommended SNF Transition of care team consulted to assist with SNF placement. Continue PT with assistance and fall precautions.   Sacral decubitus ulcer, stage II ([place])  Patient has new onset stage II sacral decubitus ulcer most likely from prolonged laying in the bed.  Patient has no previous history of sacral ulcer. Wound nurse consultation ordered. PT/OT consultation ordered for evaluation and treatment.    DVT prophylaxis: Lovenox subcu daily Code Status: Full code  Consults called: Wound care, PT/OT  Disposition: Likely discharge to SNF within the next 24 to 48 hours.  Pending bed availability.  Objective: Vitals:   10/15/19 1351 10/15/19 1951 10/16/19 0553 10/16/19 1343  BP: (!) 147/66 (!) 145/75 (!) 153/78 (!) 141/76  Pulse: 77 71 62 (!) 59  Resp: 18 16 20 16   Temp: 98.3 F (36.8 C) 97.8 F (36.6 C) 98.1 F (36.7 C) 98.6 F (37 C)  TempSrc: Oral Oral Oral Oral  SpO2: 97% 98% 98% 99%  Weight: 77 kg  77.4 kg   Height: 5' 4"  (1.626 m)       Intake/Output Summary (Last 24 hours) at 10/16/2019 1743 Last data filed at 10/16/2019 1245 Gross per 24 hour  Intake 240 ml  Output 1550 ml  Net -1310 ml   Filed Weights   10/15/19 1351 10/16/19 0553  Weight: 77 kg 77.4 kg    Exam:  . General: 64 y.o. year-old female well-developed well-nourished no acute distress.  Alert oriented x3.   Marland Kitchen  Cardiovascular: Regular rate and rhythm no rubs or gallops.  No JVD or thyromegaly noted.   Marland Kitchen Respiratory: Clear to auscultation no wheezes or rales.  Poor inspiratory effort. . Abdomen: Soft nontender nondistended normal bowel sounds present.  Musculoskeletal: No lower extremity edema  bilaterally. Marland Kitchen Psychiatry: Mood is appropriate for condition and setting.   Data Reviewed: CBC: Recent Labs  Lab 10/13/19 1544 10/13/19 1651 10/13/19 1656 10/13/19 1922 10/14/19 0450 10/15/19 0407 10/16/19 0524  WBC 14.3*  --   --  14.3* 11.3* 8.3 6.3  NEUTROABS 12.0*  --   --   --   --  5.2  --   HGB 13.6   < > 13.9 12.5 12.1 11.1* 10.5*  HCT 42.8   < > 41.0 39.5 37.9 34.6* 32.1*  MCV 92.0  --   --  91.9 92.4 92.5 89.7  PLT 203  --   --  195 169 161 165   < > = values in this interval not displayed.   Basic Metabolic Panel: Recent Labs  Lab 10/13/19 1544 10/13/19 1544 10/13/19 1651 10/13/19 1651 10/13/19 1656 10/13/19 1922 10/14/19 0450 10/15/19 0407 10/16/19 0524  NA 142   < > 142  --  143  --  145 140 138  K 4.4   < > 4.4  --  4.4  --  4.0 3.5 3.0*  CL 106   < > 109  --  108  --  113* 110 108  CO2 26  --   --   --   --   --  24 22 23   GLUCOSE 227*   < > 227*  --  227*  --  185* 176* 153*  BUN 38*   < > 36*  --  36*  --  33* 20 9  CREATININE 0.76   < > 0.80   < > 0.70 0.55 0.67 0.47 0.50  CALCIUM 10.1  --   --   --   --   --  9.5 8.9 8.5*  MG  --   --   --   --   --   --   --   --  1.5*  PHOS  --   --   --   --   --   --   --   --  2.7   < > = values in this interval not displayed.   GFR: Estimated Creatinine Clearance: 73.4 mL/min (by C-G formula based on SCr of 0.5 mg/dL). Liver Function Tests: Recent Labs  Lab 10/13/19 1544 10/14/19 0450 10/15/19 0407  AST 68* 56* 45*  ALT 41 35 32  ALKPHOS 86 69 62  BILITOT 1.4* 0.8 0.9  PROT 8.1 6.7 6.0*  ALBUMIN 4.1 3.3* 3.0*   No results for input(s): LIPASE, AMYLASE in the last 168 hours. No results for input(s): AMMONIA in the last 168 hours. Coagulation Profile: No results for input(s): INR, PROTIME in the last 168 hours. Cardiac Enzymes: Recent Labs  Lab 10/13/19 1544 10/14/19 0450  CKTOTAL 2,157* 1,504*   BNP (last 3 results) No results for input(s): PROBNP in the last 8760 hours. HbA1C: No  results for input(s): HGBA1C in the last 72 hours. CBG: Recent Labs  Lab 10/15/19 1653 10/15/19 1957 10/16/19 0743 10/16/19 1155 10/16/19 1652  GLUCAP 177* 147* 149* 165* 143*   Lipid Profile: No results for input(s): CHOL, HDL, LDLCALC, TRIG, CHOLHDL, LDLDIRECT in the last 72 hours. Thyroid Function Tests: No results for input(s): TSH,  T4TOTAL, FREET4, T3FREE, THYROIDAB in the last 72 hours. Anemia Panel: Recent Labs    10/16/19 0524  VITAMINB12 431   Urine analysis:    Component Value Date/Time   COLORURINE AMBER (A) 10/13/2019 2205   APPEARANCEUR CLEAR 10/13/2019 2205   LABSPEC 1.025 10/13/2019 2205   PHURINE 6.0 10/13/2019 2205   GLUCOSEU >=500 (A) 10/13/2019 2205   HGBUR NEGATIVE 10/13/2019 2205   BILIRUBINUR NEGATIVE 10/13/2019 2205   BILIRUBINUR small (A) 01/05/2018 1232   KETONESUR 20 (A) 10/13/2019 2205   PROTEINUR 30 (A) 10/13/2019 2205   UROBILINOGEN 1.0 01/05/2018 1232   UROBILINOGEN 2.0 (H) 04/05/2014 1208   NITRITE NEGATIVE 10/13/2019 2205   LEUKOCYTESUR NEGATIVE 10/13/2019 2205   Sepsis Labs: @LABRCNTIP (procalcitonin:4,lacticidven:4)  ) Recent Results (from the past 240 hour(s))  Urine culture     Status: Abnormal   Collection Time: 10/08/19 11:00 AM   Specimen: Urine, Catheterized  Result Value Ref Range Status   Specimen Description   Final    URINE, CATHETERIZED Performed at [facility], [place] [address], [place], [place] [address]    Special Requests   Final    NONE Performed at [facility], [place] [address] [place], [place] [address]    Culture (A)  Final    >=100,000 COLONIES/mL LACTOBACILLUS SPECIES Standardized susceptibility testing for this organism is not available. Performed at [facility], [place] [address] [place], [place] [address]    Report Status 10/11/2019 FINAL  Final  Blood culture (routine x 2)     Status: None (Preliminary result)   Collection Time: 10/13/19  3:50 PM    Specimen: BLOOD  Result Value Ref Range Status   Specimen Description   Final    BLOOD RIGHT ANTECUBITAL Performed at [facility], [place] [address] [place], [place] [address]    Special Requests   Final    BOTTLES DRAWN AEROBIC AND ANAEROBIC Blood Culture adequate volume Performed at [place] [address], [place], [place] [address]    Culture   Final    NO GROWTH 3 DAYS Performed at [facility], [place] [address] [place], [place] [address]    Report Status PENDING  Incomplete  Blood culture (routine x 2)     Status: None (Preliminary result)   Collection Time: 10/13/19  4:29 PM   Specimen: BLOOD  Result Value Ref Range Status   Specimen Description   Final    BLOOD BLOOD RIGHT HAND Performed at [place] [address], [place], [place] [address]    Special Requests   Final    BOTTLES DRAWN AEROBIC ONLY Blood Culture adequate volume Performed at [place] [address] [place], [place] [address]    Culture   Final    NO GROWTH 3 DAYS Performed at [facility], [place] [address] [place], [place] [address]    Report Status PENDING  Incomplete  Urine culture     Status: None   Collection Time: 10/13/19 10:04 PM   Specimen: Urine, Random  Result Value Ref Range Status   Specimen Description   Final    URINE, RANDOM Performed at [place] [address] [place], [place] [address]    Special Requests   Final    NONE Performed at [facility], [place] [address] [place],  [address]    Culture   Final    NO GROWTH Performed  at [facility], [place] [address] [place], [place] [address]    Report Status 10/15/2019 FINAL  Final      Studies: No results found.  Scheduled Meds: . Chlorhexidine Gluconate Cloth  6 each Topical Daily  . enoxaparin (LOVENOX) injection  40 mg Subcutaneous Q24H  . insulin aspart  0-5 Units Subcutaneous QHS   . sertraline  25 mg Oral Daily    Continuous Infusions: . sodium chloride 100 mL/hr at 10/16/19 1242  . cefTRIAXone (ROCEPHIN)  IV 1 g (10/15/19 1833)     LOS: 3 days     Kayleen Memos, MD [facility] Pager [phone]  If 7PM-7AM, please contact night-coverage [email or web address] Password [facility] 10/16/2019, 5:43 PM

## 2019-10-17 LAB — BASIC METABOLIC PANEL
Anion gap: 8 (ref 5–15)
BUN: 6 mg/dL — ABNORMAL LOW (ref 8–23)
CO2: 23 mmol/L (ref 22–32)
Calcium: 8.5 mg/dL — ABNORMAL LOW (ref 8.9–10.3)
Chloride: 107 mmol/L (ref 98–111)
Creatinine, Ser: 0.49 mg/dL (ref 0.44–1.00)
GFR calc Af Amer: >60 mL/min (ref >60)
GFR calc non Af Amer: >60 mL/min (ref >60)
Glucose, Bld: 143 mg/dL — ABNORMAL HIGH (ref 70–99)
Potassium: 3.6 mmol/L (ref 3.5–5.1)
Sodium: 138 mmol/L (ref 135–145)

## 2019-10-17 LAB — GLUCOSE, CAPILLARY
Glucose-Capillary: 129 mg/dL — ABNORMAL HIGH (ref 70–99)
Glucose-Capillary: 198 mg/dL — ABNORMAL HIGH (ref 70–99)
Glucose-Capillary: 162 mg/dL — ABNORMAL HIGH (ref 70–99)
Glucose-Capillary: 128 mg/dL — ABNORMAL HIGH (ref 70–99)

## 2019-10-17 LAB — MAGNESIUM: Magnesium: 2.2 mg/dL (ref 1.7–2.4)

## 2019-10-17 MED ORDER — POTASSIUM CHLORIDE 10 MEQ/100ML IV SOLN
INTRAVENOUS | Status: AC
  Administered 2019-10-17: 10 meq via INTRAVENOUS
  Filled 2019-10-17: qty 200

## 2019-10-17 NOTE — Progress Notes (Signed)
Occupational Therapy Treatment Patient Details Name: Jeanette Rose MRN: [id number] DOB: 1955-11-14 Today's Date: 10/17/2019    History of present illness Jeanette Rose is a 64 y.o. female with medical history significant of hypertension, diabetes mellitus, anxiety/depression and anemia is brought to ED by EMS for evaluation of altered mental status.    OT comments  Pt with self limiting behaviors.  Pt did sit EOB with OT with much coaxing and encouragement  Follow Up Recommendations  SNF;Supervision/Assistance - 24 hour(long term)    Equipment Recommendations  3 in 1 bedside commode    Recommendations for Other Services      Precautions / Restrictions Precautions Precautions: Fall       Mobility Bed Mobility Overal bed mobility: Needs Assistance Bed Mobility: Supine to Sit;Sit to Supine Rolling: Mod assist Sidelying to sit: Mod assist       General bed mobility comments: increased time for moving to EOB  Transfers                      Balance Overall balance assessment: Needs assistance Sitting-balance support: Feet supported;No upper extremity supported Sitting balance-Leahy Scale: Poor Sitting balance - Comments: does not maintain feet on ground Postural control: Posterior lean                                 ADL either performed or assessed with clinical judgement   ADL Overall ADL's : Needs assistance/impaired Eating/Feeding: Minimal assistance;Sitting   Grooming: Minimal assistance;Sitting;Wash/dry face;Wash/dry hands;Oral care;Brushing hair                                 General ADL Comments: MAX VC to particpate. Pt kept saying I cant I cant. Pt  finally agreed to sit EOB and perform grooming task,     Vision Patient Visual Report: No change from baseline            Cognition Arousal/Alertness: Awake/alert Behavior During Therapy: Flat affect Overall Cognitive Status: No family/caregiver present to determine  baseline cognitive functioning                                 General Comments: pt requries max encouragement for participation during session but follows cues consistently, when prompted to do something pt will often reply with "I can't"; significant self limiting behaviors and decreased awareness of abilities, poor self-awareness, significantly debilitating mental health limitations impacting her independence with self-care                   Pertinent Vitals/ Pain       Pain Assessment: No/denies pain     Prior Functioning/Environment              Frequency  Min 2X/week        Progress Toward Goals  OT Goals(current goals can now be found in the care plan section)  Progress towards OT goals: Progressing toward goals     Plan Discharge plan remains appropriate       AM-PAC OT "6 Clicks" Daily Activity     Outcome Measure   Help from another person eating meals?: A Little Help from another person taking care of personal grooming?: A Lot Help from another person toileting, which includes using toliet, bedpan, or urinal?: A Lot Help  from another person bathing (including washing, rinsing, drying)?: A Lot Help from another person to put on and taking off regular upper body clothing?: A Lot Help from another person to put on and taking off regular lower body clothing?: A Lot 6 Click Score: 13    End of Session Equipment Utilized During Treatment: Gait belt  OT Visit Diagnosis: Unsteadiness on feet (R26.81);Other abnormalities of gait and mobility (R26.89);Muscle weakness (generalized) (M62.81);Other symptoms and signs involving cognitive function   Activity Tolerance Other (comment)(self limiting)   Patient Left with call bell/phone within reach;in bed;with bed alarm set   Nurse Communication Mobility status        Time: 1300-1315 OT Time Calculation (min): 15 min  Charges: OT General Charges $OT Visit: 1 Visit OT Treatments $Self  Care/Home Management : 8-22 mins  Kari Baars, [place] [phone] Office- [phone]      Shaft Corigliano, Edwena Felty D 10/17/2019, 4:57 PM

## 2019-10-17 NOTE — Progress Notes (Signed)
PROGRESS NOTE  Jeanette Rose YNW:[id number] DOB: 1956-08-09 DOA: 10/13/2019 PCP: Salvatore Marvel, PA-C  HPI/Recap of past 24 hours: Jeanette Rose a 65 y.o.femalewith medical history significant ofhypertension, type II diabetes mellitus, chronic anxiety/depression who was brought to ED by EMS for evaluation of altered mental status.  She lives alone.  Recently admitted and treated for UTI.  UA positive for pyuria and CT head nonacute.  Admitted for presumed UTI.  Cultures no growth to date.  Started empirically on Rocephin.  Had urinary retention overnight and a Foley catheter was placed 10/15/19 and removed on 10/17/2019.    10/17/19: Seen and examined.  No new complaints.  Started on antidepressant 1/19 by psych.   Assessment/Plan: Principal Problem:   Acute metabolic encephalopathy Active Problems:   Type 2 diabetes mellitus with diabetic polyneuropathy, with long-term current use of insulin (HCC)   Major depressive disorder, recurrent episode, moderate (HCC)   Sepsis secondary to UTI (HCC)   Elevated CPK   Sacral decubitus ulcer, stage II ([place])  Sepsis secondary to presumed UTI Presented with leukocytosis, tachycardia, tachypnea and positive UA Recently treated for UTI during previous admission, urine culture no growth. Was started on empiric antibiotics with improvement of symptomatology Blood cultures negative to date.  Resolving acute metabolic encephalopathy likely multifactorial secondary to acute illness, dehydration versus others CT head nonacute. Today at the time of this visit she is alert and oriented x3.  Chronic depression/anxiety Seen by psych Continue Zoloft 25 mg daily and hydroxyzine 10 mg 3 times daily as needed for anxiety  Acute urinary retention Foley catheter placed on 10/15/2019 Removed on 10/17/2019. Voiding trial  Resolved post repletion: Refractory hypokalemia Potassium 3.0>> 3.6. Repleted with IV potassium supplement Repeat BMP in the  morning  Resolved post repletion: Hypomagnesemia Magnesium 1.5>> 2.2. Repleted with 4 g IV magnesium  Dehydration Improved with IV fluid hydration Encourage oral intake  Elevated CPK Presented with CPK greater than 1500 CPK is trending down Continue IV fluid hydration Repeat CPK level in the morning  Type 2 diabetes with hyperglycemia Hemoglobin A1c 9.9 on 09/12/2019. Continue insulin coverage  Physical debility PT assessment recommended SNF Transition of care team consulted to assist with SNF placement. Continue PT with assistance and fall precautions.   Sacral decubitus ulcer, stage II ([place])  Patient has new onset stage II sacral decubitus ulcer most likely from prolonged laying in the bed.  Patient has no previous history of sacral ulcer. Wound nurse consultation ordered. PT/OT consultation ordered for evaluation and treatment.    DVT prophylaxis: Lovenox subcu daily Code Status: Full code  Consults called: Wound care, PT/OT  Disposition: Likely discharge to SNF tomorrow 10/18/2019.   Objective: Vitals:   10/16/19 1343 10/16/19 2014 10/17/19 0559 10/17/19 1400  BP: (!) 141/76 (!) 146/63 122/71 (!) 150/84  Pulse: (!) 59 (!) 58 68 77  Resp: 16 17 16 16   Temp: 98.6 F (37 C) 98.1 F (36.7 C) 97.9 F (36.6 C) 98.3 F (36.8 C)  TempSrc: Oral Oral Oral Oral  SpO2: 99% 99% 98% 97%  Weight:      Height:        Intake/Output Summary (Last 24 hours) at 10/17/2019 1427 Last data filed at 10/17/2019 0100 Gross per 24 hour  Intake 480 ml  Output 2000 ml  Net -1520 ml   Filed Weights   10/15/19 1351 10/16/19 0553  Weight: 77 kg 77.4 kg    Exam:  . General: 64 y.o. year-old female well-developed well-nourished no  acute distress.  Alert and oriented x3.   . Cardiovascular: Regular rate and rhythm no rubs or gallops.  No JVD or thyromegaly noted.   Marland Kitchen Respiratory: Clear to Auscultation No Wheezes or Rales.  Poor inspiratory effort. . Abdomen: Soft nontender  nondistended with bowel sounds present.  Musculoskeletal: No lower extremity edema bilaterally.  Equal strength in all 4 extremities. Marland Kitchen Psychiatry: Flat affect.   Data Reviewed: CBC: Recent Labs  Lab 10/13/19 1544 10/13/19 1651 10/13/19 1656 10/13/19 1922 10/14/19 0450 10/15/19 0407 10/16/19 0524  WBC 14.3*  --   --  14.3* 11.3* 8.3 6.3  NEUTROABS 12.0*  --   --   --   --  5.2  --   HGB 13.6   < > 13.9 12.5 12.1 11.1* 10.5*  HCT 42.8   < > 41.0 39.5 37.9 34.6* 32.1*  MCV 92.0  --   --  91.9 92.4 92.5 89.7  PLT 203  --   --  195 169 161 165   < > = values in this interval not displayed.   Basic Metabolic Panel: Recent Labs  Lab 10/13/19 1544 10/13/19 1651 10/13/19 1656 10/13/19 1656 10/13/19 1922 10/14/19 0450 10/15/19 0407 10/16/19 0524 10/17/19 0603  NA 142   < > 143  --   --  145 140 138 138  K 4.4   < > 4.4  --   --  4.0 3.5 3.0* 3.6  CL 106   < > 108  --   --  113* 110 108 107  CO2 26  --   --   --   --  24 22 23 23   GLUCOSE 227*   < > 227*  --   --  185* 176* 153* 143*  BUN 38*   < > 36*  --   --  33* 20 9 6*  CREATININE 0.76   < > 0.70   < > 0.55 0.67 0.47 0.50 0.49  CALCIUM 10.1  --   --   --   --  9.5 8.9 8.5* 8.5*  MG  --   --   --   --   --   --   --  1.5* 2.2  PHOS  --   --   --   --   --   --   --  2.7  --    < > = values in this interval not displayed.   GFR: Estimated Creatinine Clearance: 73.4 mL/min (by C-G formula based on SCr of 0.49 mg/dL). Liver Function Tests: Recent Labs  Lab 10/13/19 1544 10/14/19 0450 10/15/19 0407  AST 68* 56* 45*  ALT 41 35 32  ALKPHOS 86 69 62  BILITOT 1.4* 0.8 0.9  PROT 8.1 6.7 6.0*  ALBUMIN 4.1 3.3* 3.0*   No results for input(s): LIPASE, AMYLASE in the last 168 hours. No results for input(s): AMMONIA in the last 168 hours. Coagulation Profile: No results for input(s): INR, PROTIME in the last 168 hours. Cardiac Enzymes: Recent Labs  Lab 10/13/19 1544 10/14/19 0450  CKTOTAL 2,157* 1,504*   BNP (last  3 results) No results for input(s): PROBNP in the last 8760 hours. HbA1C: No results for input(s): HGBA1C in the last 72 hours. CBG: Recent Labs  Lab 10/16/19 1155 10/16/19 1652 10/16/19 2204 10/17/19 0756 10/17/19 1313  GLUCAP 165* 143* 142* 129* 198*   Lipid Profile: No results for input(s): CHOL, HDL, LDLCALC, TRIG, CHOLHDL, LDLDIRECT in the last 72 hours. Thyroid Function  Tests: No results for input(s): TSH, T4TOTAL, FREET4, T3FREE, THYROIDAB in the last 72 hours. Anemia Panel: Recent Labs    10/16/19 0524  VITAMINB12 431   Urine analysis:    Component Value Date/Time   COLORURINE AMBER (A) 10/13/2019 2205   APPEARANCEUR CLEAR 10/13/2019 2205   LABSPEC 1.025 10/13/2019 2205   PHURINE 6.0 10/13/2019 2205   GLUCOSEU >=500 (A) 10/13/2019 2205   HGBUR NEGATIVE 10/13/2019 2205   BILIRUBINUR NEGATIVE 10/13/2019 2205   BILIRUBINUR small (A) 01/05/2018 1232   KETONESUR 20 (A) 10/13/2019 2205   PROTEINUR 30 (A) 10/13/2019 2205   UROBILINOGEN 1.0 01/05/2018 1232   UROBILINOGEN 2.0 (H) 04/05/2014 1208   NITRITE NEGATIVE 10/13/2019 2205   LEUKOCYTESUR NEGATIVE 10/13/2019 2205   Sepsis Labs: @LABRCNTIP (procalcitonin:4,lacticidven:4)  ) Recent Results (from the past 240 hour(s))  Urine culture     Status: Abnormal   Collection Time: 10/08/19 11:00 AM   Specimen: Urine, Catheterized  Result Value Ref Range Status   Specimen Description   Final    URINE, CATHETERIZED Performed at [facility], [place] [address] [place], [place] [address]    Special Requests   Final    NONE Performed at [facility], [place] [address] [place], [place] [address]    Culture (A)  Final    >=100,000 COLONIES/mL LACTOBACILLUS SPECIES Standardized susceptibility testing for this organism is not available. Performed at [facility], [place] [address] [place], [place] [address]    Report Status 10/11/2019 FINAL  Final  Blood culture (routine x 2)      Status: None (Preliminary result)   Collection Time: 10/13/19  3:50 PM   Specimen: BLOOD  Result Value Ref Range Status   Specimen Description   Final    BLOOD RIGHT ANTECUBITAL Performed at [facility], [place] [address], [place], [place] [address]    Special Requests   Final    BOTTLES DRAWN AEROBIC AND ANAEROBIC Blood Culture adequate volume Performed at [place] [address] [place], [place] [address]    Culture   Final    NO GROWTH 4 DAYS Performed at [facility], [place] [address], [place], [place] [address]    Report Status PENDING  Incomplete  Blood culture (routine x 2)     Status: None (Preliminary result)   Collection Time: 10/13/19  4:29 PM   Specimen: BLOOD  Result Value Ref Range Status   Specimen Description   Final    BLOOD BLOOD RIGHT HAND Performed at [place] [address] [place], [place] [address]    Special Requests   Final    BOTTLES DRAWN AEROBIC ONLY Blood Culture adequate volume Performed at [place] [address], [place], [place] [address]    Culture   Final    NO GROWTH 4 DAYS Performed at [facility], [place] [address], [place], [place] [address]    Report Status PENDING  Incomplete  Urine culture     Status: None   Collection Time: 10/13/19 10:04 PM   Specimen: Urine, Random  Result Value Ref Range Status   Specimen Description   Final    URINE, RANDOM Performed at [place] [address], [place], [place] [address]    Special Requests   Final    NONE Performed at [facility], [place] [address], [place], [place] [address]    Culture   Final  NO GROWTH Performed at [facility], [place] [address] [place], [place] [address]    Report Status 10/15/2019 FINAL  Final      Studies: No results found.  Scheduled Meds: . Chlorhexidine Gluconate Cloth  6 each Topical Daily  . enoxaparin (LOVENOX)  injection  40 mg Subcutaneous Q24H  . insulin aspart  0-5 Units Subcutaneous QHS  . sertraline  25 mg Oral Daily    Continuous Infusions: . sodium chloride 100 mL/hr at 10/16/19 1242  . cefTRIAXone (ROCEPHIN)  IV 1 g (10/16/19 2200)     LOS: 4 days     Kayleen Memos, MD [facility] Pager [phone]  If 7PM-7AM, please contact night-coverage [email or web address] Password [facility] 10/17/2019, 2:27 PM

## 2019-10-18 ENCOUNTER — Other Ambulatory Visit: Payer: Self-pay | Admitting: *Deleted

## 2019-10-18 DIAGNOSIS — E1142 Type 2 diabetes mellitus with diabetic polyneuropathy: Secondary | ICD-10-CM | POA: Diagnosis not present

## 2019-10-18 DIAGNOSIS — Z9119 Patient's noncompliance with other medical treatment and regimen: Secondary | ICD-10-CM | POA: Diagnosis not present

## 2019-10-18 DIAGNOSIS — Z794 Long term (current) use of insulin: Secondary | ICD-10-CM | POA: Diagnosis not present

## 2019-10-18 DIAGNOSIS — Z7401 Bed confinement status: Secondary | ICD-10-CM | POA: Diagnosis not present

## 2019-10-18 DIAGNOSIS — K219 Gastro-esophageal reflux disease without esophagitis: Secondary | ICD-10-CM | POA: Diagnosis not present

## 2019-10-18 DIAGNOSIS — E860 Dehydration: Secondary | ICD-10-CM | POA: Diagnosis not present

## 2019-10-18 DIAGNOSIS — L89153 Pressure ulcer of sacral region, stage 3: Secondary | ICD-10-CM | POA: Diagnosis not present

## 2019-10-18 DIAGNOSIS — G9341 Metabolic encephalopathy: Secondary | ICD-10-CM | POA: Diagnosis not present

## 2019-10-18 DIAGNOSIS — R627 Adult failure to thrive: Secondary | ICD-10-CM | POA: Diagnosis not present

## 2019-10-18 DIAGNOSIS — M6281 Muscle weakness (generalized): Secondary | ICD-10-CM | POA: Diagnosis not present

## 2019-10-18 DIAGNOSIS — R310 Gross hematuria: Secondary | ICD-10-CM | POA: Diagnosis not present

## 2019-10-18 DIAGNOSIS — Z743 Need for continuous supervision: Secondary | ICD-10-CM | POA: Diagnosis not present

## 2019-10-18 DIAGNOSIS — L89152 Pressure ulcer of sacral region, stage 2: Secondary | ICD-10-CM | POA: Diagnosis not present

## 2019-10-18 DIAGNOSIS — R682 Dry mouth, unspecified: Secondary | ICD-10-CM | POA: Diagnosis not present

## 2019-10-18 DIAGNOSIS — R5381 Other malaise: Secondary | ICD-10-CM | POA: Diagnosis not present

## 2019-10-18 DIAGNOSIS — M2550 Pain in unspecified joint: Secondary | ICD-10-CM | POA: Diagnosis not present

## 2019-10-18 DIAGNOSIS — B9689 Other specified bacterial agents as the cause of diseases classified elsewhere: Secondary | ICD-10-CM | POA: Diagnosis not present

## 2019-10-18 DIAGNOSIS — N390 Urinary tract infection, site not specified: Secondary | ICD-10-CM | POA: Diagnosis not present

## 2019-10-18 DIAGNOSIS — I10 Essential (primary) hypertension: Secondary | ICD-10-CM | POA: Diagnosis not present

## 2019-10-18 DIAGNOSIS — E785 Hyperlipidemia, unspecified: Secondary | ICD-10-CM | POA: Diagnosis not present

## 2019-10-18 LAB — CULTURE, BLOOD (ROUTINE X 2)
Culture: NO GROWTH
Culture: NO GROWTH
Special Requests: ADEQUATE
Special Requests: ADEQUATE

## 2019-10-18 LAB — GLUCOSE, CAPILLARY: Glucose-Capillary: 153 mg/dL — ABNORMAL HIGH (ref 70–99)

## 2019-10-18 LAB — NOVEL CORONAVIRUS, NAA (HOSP ORDER, SEND-OUT TO REF LAB; TAT 18-24 HRS): SARS-CoV-2, NAA: NOT DETECTED

## 2019-10-18 LAB — CK: Total CK: 255 U/L — ABNORMAL HIGH (ref 38–234)

## 2019-10-18 MED ORDER — PANTOPRAZOLE SODIUM 40 MG PO TBEC: 40.0000 mg | DELAYED_RELEASE_TABLET | Freq: Two times a day (BID) | ORAL | Status: DC

## 2019-10-18 MED ORDER — HYDROXYZINE HCL 10 MG PO TABS: 10.0000 mg | ORAL_TABLET | Freq: Three times a day (TID) | ORAL | 0 refills | Status: AC | PRN

## 2019-10-18 MED ORDER — ATORVASTATIN CALCIUM 40 MG PO TABS: 40.0000 mg | ORAL_TABLET | Freq: Every day | ORAL | Status: DC

## 2019-10-18 MED ORDER — ASPIRIN EC 81 MG PO TBEC: 81.0000 mg | DELAYED_RELEASE_TABLET | Freq: Every day | ORAL | Status: DC

## 2019-10-18 MED ORDER — IRBESARTAN 150 MG PO TABS: 300.0000 mg | ORAL_TABLET | Freq: Every day | ORAL | Status: DC

## 2019-10-18 MED ORDER — ATORVASTATIN CALCIUM 40 MG PO TABS: 80.0000 mg | ORAL_TABLET | Freq: Every day | ORAL | Status: DC

## 2019-10-18 MED ORDER — SERTRALINE HCL 25 MG PO TABS: 25.0000 mg | ORAL_TABLET | Freq: Every day | ORAL | 0 refills | Status: AC

## 2019-10-18 MED ORDER — ATORVASTATIN CALCIUM 40 MG PO TABS: 40.0000 mg | ORAL_TABLET | Freq: Every day | ORAL | 0 refills | Status: AC

## 2019-10-18 NOTE — TOC Transition Note (Signed)
Transition of Care [facility]) - CM/SW Discharge Note   Patient Details  Name: [name] MRN: [id number] Date of Birth: 03/11/1956  Transition of Care [facility]) CM/SW Contact:  Trish Mage, LCSW Phone Number: 10/18/2019, 10:05 AM   Clinical Narrative:   Spent extensive time on the phone with sister of patient explaining that there is not a long term place to send patient without payor source, and it is imperative that she and her brothers and patient's daughter put together a plan for care of patient upon d/c from rehab.  Also referred her to [facility] to address MCD eligibility issues.  Sister continues to put more energy into arguing that "the system" has a responsibility to care for patient, who has to be extensively encouraged to eat and exercise, than to rally her family for a care plan.  Pointed this out to her several times.  Patient to d/c today to [facility] SNF for rehab.  PTAR arranged.  Nursing, please call report to [phone], room 112P,  TOC sign off.    Final next level of care: Skilled Nursing Facility Barriers to Discharge: No Barriers Identified   Patient Goals and CMS Choice Patient states their goals for this hospitalization and ongoing recovery are:: "I don't want to go anywhere. I want to lay here and die." CMS Medicare.gov Compare Post Acute Care list provided to:: Patient Choice offered to / list presented to : Patient, Sibling  Discharge Placement                       Discharge Plan and Services   Discharge Planning Services: CM Consult Post Acute Care Choice: [place]                               Social Determinants of Health (SDOH) Interventions     Readmission Risk Interventions No flowsheet data found.

## 2019-10-18 NOTE — Discharge Instructions (Signed)
Rhabdomyolysis Rhabdomyolysis is a condition that happens when muscle cells break down and release substances into the blood that can damage the kidneys. This condition happens because of damage to the muscles that move bones (skeletal muscle). When the skeletal muscles are damaged, substances inside the muscle cells go into the blood. One of these substances is a protein called myoglobin. Large amounts of myoglobin can cause kidney damage or kidney failure. Other substances that are released by muscle cells may upset the balance of the minerals (electrolytes) in your blood. This imbalance causes your blood to have too much acid (acidosis). What are the causes? This condition is caused by muscle damage. Muscle damage often happens because of:  Using your muscles too much.  An injury that crushes or squeezes a muscle too tightly.  Using illegal drugs, mainly cocaine.  Alcohol abuse. Other possible causes include:  Prescription medicines, such as those that: ? Lower cholesterol (statins). ? Treat ADHD (attention deficit hyperactivity disorder) or help with weight loss (amphetamines). ? Treat pain (opiates).  Infections.  Muscle diseases that are passed down from parent to child (inherited).  High fever.  Heatstroke.  Not having enough fluids in your body (dehydration).  Seizures.  Surgery. What increases the risk? This condition is more likely to develop in people who:  Have a family history of muscle disease.  Take part in extreme sports, such as running in marathons.  Have diabetes.  Are older.  Abuse drugs or alcohol. What are the signs or symptoms? Symptoms of this condition vary. Some people have very few symptoms, and other people have many symptoms. The most common symptoms include:  Muscle pain and swelling.  Weak muscles.  Dark urine.  Feeling weak and tired. Other symptoms include:  Nausea and vomiting.  Fever.  Pain in the abdomen.  Pain in the  joints. Symptoms of complications from this condition include:  Heart rhythm that is not normal (arrhythmia).  Seizures.  Not urinating enough because of kidney failure.  Very low blood pressure (shock). Signs of shock include dizziness, blurry vision, and clammy skin.  Bleeding that is hard to stop or control. How is this diagnosed? This condition is diagnosed based on your medical history, your symptoms, and a physical exam. Tests may also be done, including:  Blood tests.  Urine tests to check for myoglobin. You may also have other tests to check for causes of muscle damage and to check for complications. How is this treated? Treatment for this condition helps to:  Make sure you have enough fluids in your body.  Lower the acid levels in your blood to reverse acidosis.  Protect your kidneys. Treatment may include:  Fluids and medicines given through an IV tube that is inserted into one of your veins.  Medicines to lower acidosis or to bring back the balance of the minerals in your body.  Hemodialysis. This treatment uses an artificial kidney machine to filter your blood while you recover. You may have this if other treatments are not helping. Follow these instructions at home:   Take over-the-counter and prescription medicines only as told by your health care provider.  Rest at home until your health care provider says that you can return to your normal activities.  Drink enough fluid to keep your urine clear or pale yellow.  Do not do activities that take a lot of effort (are strenuous). Ask your health care provider what level of exercise is safe for you.  Do not abuse drugs or  alcohol. If you are having problems with drug or alcohol use, ask your health care provider for help.  Keep all follow-up visits as told by your health care provider. This is important. Contact a health care provider if:  You start having symptoms of this condition after treatment. Get  help right away if:  You have a seizure.  You bleed easily or cannot control bleeding.  You cannot urinate.  You have chest pain.  You have trouble breathing. This information is not intended to replace advice given to you by your health care provider. Make sure you discuss any questions you have with your health care provider. Document Revised: 08/26/2017 Document Reviewed: 06/25/2016 Elsevier Patient Education  2020 [employer].

## 2019-10-18 NOTE — Patient Outreach (Signed)
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  Initial outreach to patient was unsuccessful and she was re-admitted to hospital for further outreach attempts; was notified this afternoon by [facility] Liaison that patient was transitioned from hospital to long-term SNF; RN [place] also confirmed that patient is not eligible for [facility] CM program.  Plan:  Will make patient inactive with [place] CM, as she is now at long-term SNF and is not eligible for [facility] CM services, per [place], [place], BSN, [place] Care Management  [phone]

## 2019-10-18 NOTE — Progress Notes (Addendum)
Physical Therapy Treatment Patient Details Name: Jeanette Rose MRN: [id number] DOB: 01-Dec-1955 Today's Date: 10/18/2019    History of Present Illness Jeanette Rose is a 64 y.o. female with medical history significant of hypertension, diabetes mellitus, anxiety/depression and anemia is brought to ED by EMS for evaluation of altered mental status.     PT Comments    Pt initially stated, "I don't have time" and, "I can't" when asked to get up from bed to recliner. With much encouragement she did get up to recliner, then back to bed, as well as participating in LE strengthening exercises. When asked what activities she enjoys, she said, "I don't remember" and "I can't do any of those things".    Follow Up Recommendations  SNF     Equipment Recommendations  None recommended by PT    Recommendations for Other Services       Precautions / Restrictions Precautions Precautions: Fall Restrictions Weight Bearing Restrictions: No    Mobility  Bed Mobility Overal bed mobility: Needs Assistance Bed Mobility: Supine to Sit;Sit to Supine Rolling: Mod assist Sidelying to sit: Mod assist       General bed mobility comments: increased time for moving to EOB  Transfers Overall transfer level: Needs assistance Equipment used: 1 person hand held assist Transfers: Sit to/from Stand Sit to Stand: Mod assist Stand pivot transfers: Min assist       General transfer comment: assist to rise, then assist to steady for SPT x 2 trials  Ambulation/Gait                 Stairs             Wheelchair Mobility    Modified Rankin (Stroke Patients Only)       Balance Overall balance assessment: Needs assistance Sitting-balance support: Feet supported;No upper extremity supported Sitting balance-Leahy Scale: Fair Sitting balance - Comments: does not maintain feet on ground Postural control: Posterior lean Standing balance support: Single extremity supported Standing  balance-Leahy Scale: Poor                              Cognition Arousal/Alertness: Awake/alert Behavior During Therapy: Flat affect Overall Cognitive Status: No family/caregiver present to determine baseline cognitive functioning                                 General Comments: pt requries max encouragement for participation during session but follows cues consistently, when prompted to do something pt will often reply with "I can't"; significant self limiting behaviors and decreased awareness of abilities, poor self-awareness, significantly debilitating mental health limitations impacting her independence with self-care      Exercises Total Joint Exercises Ankle Circles/Pumps: AROM;Both;10 reps;Supine Long Arc Quad: AROM;Both;10 reps;Seated General Exercises - Lower Extremity Hip Flexion/Marching: AROM;Both;10 reps;Seated    General Comments        Pertinent Vitals/Pain Faces Pain Scale: Hurts little more Pain Location: pt would not specify location but did say she had pain Pain Descriptors / Indicators: Guarding;Grimacing Pain Intervention(s): Limited activity within patient's tolerance;Monitored during session;Repositioned    Home Living                      Prior Function            PT Goals (current goals can now be found in the care plan section) Acute Rehab PT  Goals Patient Stated Goal: pt stated, " I don't remember" and "I can't do anything" when asked what activities she enjoys PT Goal Formulation: With patient Time For Goal Achievement: 10/19/19 Potential to Achieve Goals: Fair Progress towards PT goals: Not progressing toward goals - comment(pt self limits)    Frequency    Min 2X/week      PT Plan Current plan remains appropriate    Co-evaluation              AM-PAC PT "6 Clicks" Mobility   Outcome Measure  Help needed turning from your back to your side while in a flat bed without using bedrails?: A  Lot Help needed moving from lying on your back to sitting on the side of a flat bed without using bedrails?: A Lot Help needed moving to and from a bed to a chair (including a wheelchair)?: A Lot Help needed standing up from a chair using your arms (e.g., wheelchair or bedside chair)?: A Lot Help needed to walk in hospital room?: A Lot Help needed climbing 3-5 steps with a railing? : Total 6 Click Score: 11    End of Session Equipment Utilized During Treatment: Gait belt Activity Tolerance: Patient tolerated treatment well;No increased pain Patient left: in bed;with call bell/phone within reach;with bed alarm set Nurse Communication: Mobility status PT Visit Diagnosis: Muscle weakness (generalized) (M62.81);Difficulty in walking, not elsewhere classified (R26.2)     Time: 7026-3785 PT Time Calculation (min) (ACUTE ONLY): 15 min  Charges:  $Therapeutic Activity: 8-22 mins                     Blondell Reveal Kistler PT 10/18/2019  Acute Rehabilitation Services Pager [phone] Office [phone]

## 2019-10-18 NOTE — Discharge Summary (Addendum)
Discharge Summary  Jeanette Rose EKC:[id number] DOB: 03/13/1956  PCP: Salvatore Marvel, PA-C  Admit date: 10/13/2019 Discharge date: 10/18/2019  Time spent: 35 minutes  Recommendations for Outpatient Follow-up:  1. Follow up with psychiatry  2. Follow up with your PCP 3. Take your medications as prescribed 4. Continue PT OT with assistance and fall precautions.  Discharge Diagnoses:  Active Hospital Problems   Diagnosis Date Noted  . Acute metabolic encephalopathy 50/56/9794  . Elevated CPK 10/14/2019  . Sacral decubitus ulcer, stage II ([place]) 10/14/2019  . Sepsis secondary to UTI ([place]) 10/13/2019  . Major depressive disorder, recurrent episode, moderate ([place])   . Type 2 diabetes mellitus with diabetic polyneuropathy, with long-term current use of insulin [facility])     Resolved Hospital Problems   Diagnosis Date Noted Date Resolved  . MDD (major depressive disorder), recurrent episode, severe ([place]) 03/04/2019 10/16/2019    Discharge Condition: Stable  Diet recommendation: Heart healthy carb modified diet  Vitals:   10/17/19 2104 10/18/19 0603  BP: (!) 168/75 (!) 174/76  Pulse: 64 69  Resp: 17 17  Temp: 98.1 F (36.7 C) 97.9 F (36.6 C)  SpO2: 99% 98%    History of present illness:  Jeanette Rose a 64 y.o.femalewith medical history significant ofhypertension,type IIdiabetes mellitus,chronicanxiety/depressionwho [facility]br[facility] for evaluation of altered mental status. She lives alone. Recently admitted and treated for UTI. UA positive for pyuria, urine culture no growth.  Was treated empirically with antibiotics as she did not take her post hospital antibiotics.  Admission CT head was nonacute. Admitted for acute metabolic encephalopathy thought multifactorial secondary to acute dehydration from poor oral intake and from presumed UTI.   Had an acute urinary retention and a Foley catheter was placed on 10/15/19 and removed on 10/17/2019.  Worked with PT  with recommendation for SNF for physical rehab.  10/18/19: Seen and examined.  No acute events overnight.  Vital signs and labs reviewed and are stable.  On the day of discharge, the patient was hemodynamically stable.  She will need to follow-up with psych and PCP posthospitalization.  She will also need to continue PT OT with assistance and fall precautions.  Patient is being discharged to SNF for rehab.   Hospital Course:  Principal Problem:   Acute metabolic encephalopathy Active Problems:   Type 2 diabetes mellitus with diabetic polyneuropathy, with long-term current use of insulin (HCC)   Major depressive disorder, recurrent episode, moderate (HCC)   Sepsis secondary to UTI (HCC)   Elevated CPK   Sacral decubitus ulcer, stage II (HCC)  Resolved sepsis secondary to presumed UTI in the setting of recently diagnosed lactobacillus UTI She did not start or completed her course of antibiotics post hospital discharge. Presented with leukocytosis, tachycardia, tachypnea and positive UA Urine culture no growth. Ucx from previous admission 10/08/19 grew >100,000 colonies of lactobacillus species. Received antibiotics empirically. Blood cultures negative to date. Currently afebrile.  No leukocytosis.  Resolving acute metabolic encephalopathy likely multifactorial secondary to acute illness, dehydration, possible UTI CT head nonacute. Alert and oriented x 3  Chronic depression/anxiety Seen by psych Dr. Burnett Corrente recommended Zoloft 25 mg daily and hydroxyzine 10 mg 3 times daily as needed for anxiety Follow up with psych outpatient  Resolved acute urinary retention Foley catheter placed on 10/15/2019 Removed on 10/17/2019.  Resolved post repletion: Refractory hypokalemia Potassium 3.0>> 3.6.  Resolved post repletion: Hypomagnesemia Magnesium 1.5>> 2.2.  Dehydration Improved with IV fluid hydration Encouraged to increase oral intake  Resolving Elevated CPK Presented  with CPK greater than 1500 CPK is trending down to 255 Received IV fluid hydration  Type 2 diabetes with hyperglycemia Hemoglobin A1c 9.9 on 09/12/2019. Insulin sliding scale ordered but has not been receiving Blood sugars have been below 200 since 10/13/19 Resume metformin Follow up with PCP  Hypertension Continue home medications  HLD Cut down dose of lipitor down to 40 mg qhs due to mild elevation of AST  Mildly isolated elevated AST  Alk phos, ALT, Tbili normal Trended down to 45 Follow up with PCP  Physical debility PT assessment recommended SNF Transition of care team consulted to assist with SNF placement. Continue PT with assistance and fall precautions.  Sacral decubitus ulcer, stage II ([place])  Patient has new onset stage II sacral decubitus ulcer most likely from prolonged laying in the bed.  Continue local wound care    Code Status:Full code    Discharge Exam: BP (!) 174/76   Pulse 69   Temp 97.9 F (36.6 C) (Oral)   Resp 17   Ht _0  (1.626 m)   Wt 77.4 kg   SpO2 98%   BMI 29.29 kg/m  . General: 64 y.o. year-old female well developed well nourished in no acute distress.  Alert and oriented x3. . Cardiovascular: Regular rate and rhythm with no rubs or gallops.  No thyromegaly or JVD noted.   Marland Kitchen Respiratory: Clear to auscultation with no wheezes or rales. Good inspiratory effort. . Abdomen: Soft nontender nondistended with normal bowel sounds x4 quadrants. . Musculoskeletal: No lower extremity edema. 2/4 pulses in all 4 extremities. Marland Kitchen Psychiatry: Mood is appropriate for condition and setting  Discharge Instructions You were cared for by a hospitalist during your hospital stay. If you have any questions about your discharge medications or the care you received while you were in the hospital after you are discharged, you can call the unit and asked to speak with the hospitalist on call if the hospitalist that took care of you is not available.  Once you are discharged, your primary care physician will handle any further medical issues. Please note that NO REFILLS for any discharge medications will be authorized once you are discharged, as it is imperative that you return to your primary care physician (or establish a relationship with a primary care physician if you do not have one) for your aftercare needs so that they can reassess your need for medications and monitor your lab values.   Allergies as of 10/18/2019      Reactions   Farxiga [dapagliflozin] Anxiety, Other (See Comments)   Made depression and anxiety worse   Rexulti [brexpiprazole] Anxiety, Other (See Comments)   Made anxiety worse   Trintellix [vortioxetine] Anxiety, Other (See Comments)   Made anxiety worse      Medication List    STOP taking these medications   busPIRone 30 MG tablet Commonly known as: BUSPAR   cephALEXin 500 MG capsule Commonly known as: KEFLEX   diazepam 5 MG tablet Commonly known as: VALIUM   fluvoxaMINE 100 MG tablet Commonly known as: LUVOX   Iloperidone 2 MG Tabs   insulin glargine 100 UNIT/ML injection Commonly known as: LANTUS     TAKE these medications   aspirin 81 MG EC tablet Take 1 tablet (81 mg total) by mouth daily. Swallow whole.   atorvastatin 40 MG tablet Commonly known as: LIPITOR Take 1 tablet (40 mg total) by mouth at bedtime. What changed:   medication strength  how much to take   docusate sodium 100 MG capsule Commonly known as: COLACE Take 1 capsule (100 mg total) by mouth 2 (two) times daily. What changed:   when to take this  reasons to take this   hydrOXYzine 10 MG tablet Commonly known as: ATARAX/VISTARIL Take 1 tablet (10 mg total) by mouth 3 (three) times daily as needed for anxiety.   irbesartan 300 MG tablet Commonly known as: AVAPRO Take 1 tablet (300 mg total) by mouth daily.   metFORMIN 500 MG 24 hr tablet Commonly known as: GLUCOPHAGE-XR Take 1,000 mg by mouth daily with  breakfast.   pantoprazole 40 MG tablet Commonly known as: PROTONIX Take 1 tablet (40 mg total) by mouth 2 (two) times daily.   sertraline 25 MG tablet Commonly known as: ZOLOFT Take 1 tablet (25 mg total) by mouth daily.            Durable Medical Equipment  (From admission, onward)         Start     Ordered   10/14/19 1419  For home use only DME 3 n 1  Once     10/14/19 1418         Allergies  Allergen Reactions  . Farxiga [Dapagliflozin] Anxiety and Other (See Comments)    Made depression and anxiety worse  . Rexulti [Brexpiprazole] Anxiety and Other (See Comments)    Made anxiety worse  . Trintellix [Vortioxetine] Anxiety and Other (See Comments)    Made anxiety worse    Contact information for follow-up providers    Church, [name], [place]. Call in 1 day(s).   Specialty: Family Medicine Why: Please call for a post hospital follow up appointment Contact information: [address] [place] [address] [phone]        Hampton Abbot, MD. Call in 1 day(s).   Specialty: Psychiatry Why: Please call for a post hospital follow up appointment. Contact information: [place] [address] [phone]            Contact information for after-discharge care    Destination    [facility] Preferred SNF .   Service: Skilled Nursing Contact information: [address] [place] [phone]                   The results of significant diagnostics from this hospitalization (including imaging, microbiology, ancillary and laboratory) are listed below for reference.    Significant Diagnostic Studies: CT Head Wo Contrast  Result Date: 10/13/2019 CLINICAL DATA:  Altered mental status, TIA EXAM: CT HEAD WITHOUT CONTRAST TECHNIQUE: Contiguous axial images were obtained from the base of the skull through the vertex without intravenous contrast. COMPARISON:  CT head 10/13/2011 FINDINGS:  Brain: No evidence of acute infarction, hemorrhage, hydrocephalus, extra-axial collection or mass lesion/mass effect. Symmetric prominence of the ventricles, cisterns and sulci compatible with parenchymal volume loss. Patchy areas of white matter hypoattenuation are most compatible with chronic microvascular angiopathy. Vascular: No hyperdense vessel or unexpected calcification. Skull: No calvarial fracture or suspicious osseous lesion. No scalp swelling or hematoma. Sinuses/Orbits: Paranasal sinuses and mastoid air cells are predominantly clear. Included orbital structures are unremarkable. Other: None IMPRESSION: No CT evidence of acute intracranial pathology. Electronically Signed   By: Lovena Le M.D.   On: 10/13/2019 17:07   DG Chest Port 1 View  Result Date: 10/13/2019 CLINICAL DATA:  Altered mental status, UTI EXAM: PORTABLE CHEST 1 VIEW COMPARISON:  09/17/2019 FINDINGS:  The heart size and mediastinal contours are within normal limits. Both lungs are clear. The visualized skeletal structures are unremarkable. IMPRESSION: No active disease. Electronically Signed   By: Davina Poke D.O.   On: 10/13/2019 16:49    Microbiology: Recent Results (from the past 240 hour(s))  Urine culture     Status: Abnormal   Collection Time: 10/08/19 11:00 AM   Specimen: Urine, Catheterized  Result Value Ref Range Status   Specimen Description   Final    URINE, CATHETERIZED Performed at [facility] [address] [place], [place] [address]    Special Requests   Final    NONE Performed at [facility], [place] [address] [place], [place] [address]    Culture (A)  Final    >=100,000 COLONIES/mL LACTOBACILLUS SPECIES Standardized susceptibility testing for this organism is not available. Performed at [facility], [place] [address], [place], [place] [address]    Report Status 10/11/2019 FINAL  Final  Blood culture (routine x 2)     Status: None   Collection  Time: 10/13/19  3:50 PM   Specimen: BLOOD  Result Value Ref Range Status   Specimen Description   Final    BLOOD RIGHT ANTECUBITAL Performed at [facility], [place] [address], [place], [place] [address]    Special Requests   Final    BOTTLES DRAWN AEROBIC AND ANAEROBIC Blood Culture adequate volume Performed at [facility] [address] [place], [place] [address]    Culture   Final    NO GROWTH 5 DAYS Performed at [facility], [place] [address], [place], [place] [address]    Report Status 10/18/2019 FINAL  Final  Blood culture (routine x 2)     Status: None   Collection Time: 10/13/19  4:29 PM   Specimen: BLOOD  Result Value Ref Range Status   Specimen Description   Final    BLOOD BLOOD RIGHT HAND Performed at [facility] [address], [place], [place] [address]    Special Requests   Final    BOTTLES DRAWN AEROBIC ONLY Blood Culture adequate volume Performed at [facility] [address], [place], [place] [address]    Culture   Final    NO GROWTH 5 DAYS Performed at [facility], [place] [address], [place], [place] [address]    Report Status 10/18/2019 FINAL  Final  Urine culture     Status: None   Collection Time: 10/13/19 10:04 PM   Specimen: Urine, Random  Result Value Ref Range Status   Specimen Description   Final    URINE, RANDOM Performed at [facility] [address], [place], [place] [address]    Special Requests   Final    NONE Performed at [facility], [place] [address] [place], [place] [address]    Culture   Final    NO GROWTH Performed at [facility], [place] [address] [place], [place] [address]    Report Status 10/15/2019 FINAL  Final  Novel Coronavirus, NAA (Hosp order, Send-out to Ref Lab; TAT 18-24 hrs     Status: None   Collection Time: 10/17/19 10:52 AM   Specimen: Nasopharyngeal Swab; Respiratory  Result Value Ref Range  Status   SARS-CoV-2, NAA NOT DETECTED NOT DETECTED Final    Comment: (NOTE) This nucleic acid amplification test was developed and its performance characteristics determined by [facility]. Nucleic acid amplification tests include RT-PCR and TMA. This test has not been FDA cleared or approved. This test has been authorized by FDA under an Emergency Use Authorization (EUA). This test is only authorized for the duration of time the declaration that circumstances exist justifying the authorization of the emergency use of in vitro diagnostic tests for detection of SARS-CoV-2 virus and/or diagnosis of COVID-19 infection under section 564(b)(1) of the Act, 21 U.S.C. 010OFH-2(R) (1), unless the authorization is terminated or revoked sooner. When diagnostic testing is negative, the possibility of a false negative result should be considered in the context of a patient's recent exposures and the presence of clinical signs and symptoms consistent with COVID-19. An individual without symptoms of COVID- 19 and who is not shedding SARS-CoV-2  virus would expect to have a negative (not detected) result in this assay. Performed At: [facility] [place], [place] [id number] Rush Farmer MD DI:[id number]    [place]  Final    Comment: Performed at [facility] [address], [place], [place] [address]     Labs: Basic Metabolic Panel: Recent Labs  Lab 10/13/19 1544 10/13/19 1651 10/13/19 1656 10/13/19 1656 10/13/19 1922 10/14/19 0450 10/15/19 0407 10/16/19 0524 10/17/19 0603  NA 142   < > 143  --   --  145 140 138 138  K 4.4   < > 4.4  --   --  4.0 3.5 3.0* 3.6  CL 106   < > 108  --   --  113* 110 108 107  CO2 26  --   --   --   --  _0 GLUCOSE 227*   < > 227*  --   --  185* 176* 153* 143*  BUN 38*   < > 36*  --   --  33* 20 9 6*  CREATININE 0.76   < > 0.70   < > 0.55 0.67 0.47 0.50 0.49  CALCIUM  10.1  --   --   --   --  9.5 8.9 8.5* 8.5*  MG  --   --   --   --   --   --   --  1.5* 2.2  PHOS  --   --   --   --   --   --   --  2.7  --    < > = values in this interval not displayed.   Liver Function Tests: Recent Labs  Lab 10/13/19 1544 10/14/19 0450 10/15/19 0407  AST 68* 56* 45*  ALT 41 35 32  ALKPHOS 86 69 62  BILITOT 1.4* 0.8 0.9  PROT 8.1 6.7 6.0*  ALBUMIN 4.1 3.3* 3.0*   No results for input(s): LIPASE, AMYLASE in the last 168 hours. No results for input(s): AMMONIA in the last 168 hours. CBC: Recent Labs  Lab 10/13/19 1544 10/13/19 1651 10/13/19 1656 10/13/19 1922 10/14/19 0450 10/15/19 0407 10/16/19 0524  WBC 14.3*  --   --  14.3* 11.3* 8.3 6.3  NEUTROABS 12.0*  --   --   --   --  5.2  --   HGB 13.6   < > 13.9 12.5 12.1 11.1* 10.5*  HCT 42.8   < > 41.0 39.5 37.9 34.6* 32.1*  MCV 92.0  --   --  91.9 92.4 92.5 89.7  PLT 203  --   --  195 169 161 165   < > =  values in this interval not displayed.   Cardiac Enzymes: Recent Labs  Lab 10/13/19 1544 10/14/19 0450 10/18/19 0544  CKTOTAL 2,157* 1,504* 255*   BNP: BNP (last 3 results) No results for input(s): BNP in the last 8760 hours.  ProBNP (last 3 results) No results for input(s): PROBNP in the last 8760 hours.  CBG: Recent Labs  Lab 10/17/19 0756 10/17/19 1313 10/17/19 1701 10/17/19 2239 10/18/19 0741  GLUCAP 129* 198* 162* 128* 153*       Signed:  Kayleen Memos, MD [facility] 10/18/2019, 10:46 AM

## 2019-10-18 NOTE — Consult Note (Signed)
[facility] Inpatient Consult   10/18/2019  Jeanette Rose 08-23-1956 [id number]    Update Note:   Following disposition of this [employer] patient with extreme high risk score of 35% for unplanned readmission and hospitalization.  Transition of care CM note shows that patient is discharge today to skilled nursing facility:[facility] for rehab.  No further [facility]) care management follow-up needs at this time for patient's care will be met at the skilled level of care.  Noted that patient's Primary Care Provider is [name]. [place], PA-C with [facility] ([place] [facility] since 03/04/2019, not a [facility])  provider.  Patient is NOT currentlya beneficiary of the attributed ACO (Accountable Care Organization) Registry in the [employer].  Reason:Herpresent primary care provider isnot affiliated with [employer]  This patient is currentlyNOT coveredfor [facility] Care Management Services.  Plan: Will notify [facility]) RN CM (whom patient has pending status with) of patient's discharge disposition and primary care provider update information.    For questions and additional information, please call:  Brent Noto A. Alberto Schoch, BSN, RN-BC [facility] Liaison Cell: [phone]

## 2019-10-19 DIAGNOSIS — R627 Adult failure to thrive: Secondary | ICD-10-CM | POA: Diagnosis not present

## 2019-10-19 DIAGNOSIS — M6281 Muscle weakness (generalized): Secondary | ICD-10-CM | POA: Diagnosis not present

## 2019-10-19 DIAGNOSIS — I10 Essential (primary) hypertension: Secondary | ICD-10-CM | POA: Diagnosis not present

## 2019-10-25 DIAGNOSIS — L89153 Pressure ulcer of sacral region, stage 3: Secondary | ICD-10-CM | POA: Diagnosis not present

## 2019-10-30 DIAGNOSIS — R627 Adult failure to thrive: Secondary | ICD-10-CM | POA: Diagnosis not present

## 2019-10-30 DIAGNOSIS — E860 Dehydration: Secondary | ICD-10-CM | POA: Diagnosis not present

## 2019-10-30 DIAGNOSIS — M6281 Muscle weakness (generalized): Secondary | ICD-10-CM | POA: Diagnosis not present

## 2019-10-31 DIAGNOSIS — E860 Dehydration: Secondary | ICD-10-CM | POA: Diagnosis not present

## 2019-10-31 DIAGNOSIS — R627 Adult failure to thrive: Secondary | ICD-10-CM | POA: Diagnosis not present

## 2019-10-31 DIAGNOSIS — M6281 Muscle weakness (generalized): Secondary | ICD-10-CM | POA: Diagnosis not present

## 2019-11-01 DIAGNOSIS — L89153 Pressure ulcer of sacral region, stage 3: Secondary | ICD-10-CM | POA: Diagnosis not present

## 2019-11-02 DIAGNOSIS — Z9119 Patient's noncompliance with other medical treatment and regimen: Secondary | ICD-10-CM | POA: Diagnosis not present

## 2019-11-02 DIAGNOSIS — M6281 Muscle weakness (generalized): Secondary | ICD-10-CM | POA: Diagnosis not present

## 2019-11-02 DIAGNOSIS — R627 Adult failure to thrive: Secondary | ICD-10-CM | POA: Diagnosis not present

## 2019-11-06 DIAGNOSIS — E860 Dehydration: Secondary | ICD-10-CM | POA: Diagnosis not present

## 2019-11-06 DIAGNOSIS — N390 Urinary tract infection, site not specified: Secondary | ICD-10-CM | POA: Diagnosis not present

## 2019-11-06 DIAGNOSIS — R310 Gross hematuria: Secondary | ICD-10-CM | POA: Diagnosis not present

## 2019-11-08 DIAGNOSIS — L89153 Pressure ulcer of sacral region, stage 3: Secondary | ICD-10-CM | POA: Diagnosis not present

## 2019-11-09 DIAGNOSIS — N390 Urinary tract infection, site not specified: Secondary | ICD-10-CM | POA: Diagnosis not present

## 2019-11-09 DIAGNOSIS — Z9119 Patient's noncompliance with other medical treatment and regimen: Secondary | ICD-10-CM | POA: Diagnosis not present

## 2019-11-09 DIAGNOSIS — R627 Adult failure to thrive: Secondary | ICD-10-CM | POA: Diagnosis not present

## 2019-11-09 DIAGNOSIS — R682 Dry mouth, unspecified: Secondary | ICD-10-CM | POA: Diagnosis not present

## 2019-11-14 DIAGNOSIS — M545 Low back pain: Secondary | ICD-10-CM | POA: Diagnosis not present

## 2019-11-14 DIAGNOSIS — R682 Dry mouth, unspecified: Secondary | ICD-10-CM | POA: Diagnosis not present

## 2019-11-14 DIAGNOSIS — R450 Nervousness: Secondary | ICD-10-CM | POA: Diagnosis not present

## 2019-11-15 DIAGNOSIS — L89153 Pressure ulcer of sacral region, stage 3: Secondary | ICD-10-CM | POA: Diagnosis not present

## 2019-11-19 DIAGNOSIS — R682 Dry mouth, unspecified: Secondary | ICD-10-CM | POA: Diagnosis not present

## 2019-11-19 DIAGNOSIS — E860 Dehydration: Secondary | ICD-10-CM | POA: Diagnosis not present

## 2019-11-19 DIAGNOSIS — M6281 Muscle weakness (generalized): Secondary | ICD-10-CM | POA: Diagnosis not present

## 2019-11-19 DIAGNOSIS — G9341 Metabolic encephalopathy: Secondary | ICD-10-CM | POA: Diagnosis not present

## 2019-11-20 ENCOUNTER — Encounter (HOSPITAL_COMMUNITY): Payer: Self-pay

## 2019-11-20 ENCOUNTER — Other Ambulatory Visit: Payer: Self-pay

## 2019-11-20 ENCOUNTER — Inpatient Hospital Stay (HOSPITAL_COMMUNITY)
Admission: EM | Admit: 2019-11-20 | Discharge: 2019-11-24 | DRG: 689 | Disposition: A | Discharge status: Skilled Nursing Facility | Payer: Medicare Other | Source: Skilled Nursing Facility | Attending: Internal Medicine | Admitting: Internal Medicine

## 2019-11-20 DIAGNOSIS — Z7984 Long term (current) use of oral hypoglycemic drugs: Secondary | ICD-10-CM

## 2019-11-20 DIAGNOSIS — Z8673 Personal history of transient ischemic attack (TIA), and cerebral infarction without residual deficits: Secondary | ICD-10-CM

## 2019-11-20 DIAGNOSIS — M797 Fibromyalgia: Secondary | ICD-10-CM | POA: Diagnosis not present

## 2019-11-20 DIAGNOSIS — F329 Major depressive disorder, single episode, unspecified: Secondary | ICD-10-CM

## 2019-11-20 DIAGNOSIS — E785 Hyperlipidemia, unspecified: Secondary | ICD-10-CM | POA: Diagnosis not present

## 2019-11-20 DIAGNOSIS — N390 Urinary tract infection, site not specified: Principal | ICD-10-CM | POA: Diagnosis present

## 2019-11-20 DIAGNOSIS — F23 Brief psychotic disorder: Secondary | ICD-10-CM | POA: Diagnosis present

## 2019-11-20 DIAGNOSIS — Z809 Family history of malignant neoplasm, unspecified: Secondary | ICD-10-CM

## 2019-11-20 DIAGNOSIS — Z888 Allergy status to other drugs, medicaments and biological substances status: Secondary | ICD-10-CM

## 2019-11-20 DIAGNOSIS — G4733 Obstructive sleep apnea (adult) (pediatric): Secondary | ICD-10-CM | POA: Diagnosis not present

## 2019-11-20 DIAGNOSIS — E1142 Type 2 diabetes mellitus with diabetic polyneuropathy: Secondary | ICD-10-CM | POA: Diagnosis not present

## 2019-11-20 DIAGNOSIS — Z79899 Other long term (current) drug therapy: Secondary | ICD-10-CM

## 2019-11-20 DIAGNOSIS — L8990 Pressure ulcer of unspecified site, unspecified stage: Secondary | ICD-10-CM | POA: Insufficient documentation

## 2019-11-20 DIAGNOSIS — Z7982 Long term (current) use of aspirin: Secondary | ICD-10-CM

## 2019-11-20 DIAGNOSIS — R319 Hematuria, unspecified: Secondary | ICD-10-CM | POA: Diagnosis not present

## 2019-11-20 DIAGNOSIS — F323 Major depressive disorder, single episode, severe with psychotic features: Secondary | ICD-10-CM | POA: Diagnosis present

## 2019-11-20 DIAGNOSIS — L89153 Pressure ulcer of sacral region, stage 3: Secondary | ICD-10-CM | POA: Diagnosis not present

## 2019-11-20 DIAGNOSIS — Z03818 Encounter for observation for suspected exposure to other biological agents ruled out: Secondary | ICD-10-CM | POA: Diagnosis not present

## 2019-11-20 DIAGNOSIS — Z20822 Contact with and (suspected) exposure to covid-19: Secondary | ICD-10-CM | POA: Diagnosis present

## 2019-11-20 DIAGNOSIS — L89323 Pressure ulcer of left buttock, stage 3: Secondary | ICD-10-CM | POA: Diagnosis present

## 2019-11-20 DIAGNOSIS — E039 Hypothyroidism, unspecified: Secondary | ICD-10-CM | POA: Diagnosis not present

## 2019-11-20 DIAGNOSIS — R1310 Dysphagia, unspecified: Secondary | ICD-10-CM | POA: Diagnosis not present

## 2019-11-20 DIAGNOSIS — L89313 Pressure ulcer of right buttock, stage 3: Secondary | ICD-10-CM | POA: Diagnosis present

## 2019-11-20 DIAGNOSIS — E1169 Type 2 diabetes mellitus with other specified complication: Secondary | ICD-10-CM | POA: Diagnosis not present

## 2019-11-20 DIAGNOSIS — F32A Depression, unspecified: Secondary | ICD-10-CM

## 2019-11-20 DIAGNOSIS — Z833 Family history of diabetes mellitus: Secondary | ICD-10-CM | POA: Diagnosis not present

## 2019-11-20 DIAGNOSIS — Z87891 Personal history of nicotine dependence: Secondary | ICD-10-CM | POA: Diagnosis not present

## 2019-11-20 DIAGNOSIS — Z9119 Patient's noncompliance with other medical treatment and regimen: Secondary | ICD-10-CM | POA: Diagnosis not present

## 2019-11-20 DIAGNOSIS — Z8249 Family history of ischemic heart disease and other diseases of the circulatory system: Secondary | ICD-10-CM

## 2019-11-20 DIAGNOSIS — I10 Essential (primary) hypertension: Secondary | ICD-10-CM | POA: Diagnosis not present

## 2019-11-20 DIAGNOSIS — F603 Borderline personality disorder: Secondary | ICD-10-CM | POA: Diagnosis present

## 2019-11-20 DIAGNOSIS — R0902 Hypoxemia: Secondary | ICD-10-CM | POA: Diagnosis not present

## 2019-11-20 DIAGNOSIS — R52 Pain, unspecified: Secondary | ICD-10-CM | POA: Diagnosis not present

## 2019-11-20 DIAGNOSIS — K219 Gastro-esophageal reflux disease without esophagitis: Secondary | ICD-10-CM | POA: Diagnosis not present

## 2019-11-20 DIAGNOSIS — F411 Generalized anxiety disorder: Secondary | ICD-10-CM | POA: Diagnosis present

## 2019-11-20 DIAGNOSIS — F609 Personality disorder, unspecified: Secondary | ICD-10-CM | POA: Diagnosis present

## 2019-11-20 DIAGNOSIS — Z743 Need for continuous supervision: Secondary | ICD-10-CM | POA: Diagnosis not present

## 2019-11-20 LAB — COMPREHENSIVE METABOLIC PANEL
ALT: 20 U/L (ref 0–44)
AST: 24 U/L (ref 15–41)
Albumin: 4.1 g/dL (ref 3.5–5.0)
Alkaline Phosphatase: 89 U/L (ref 38–126)
Anion gap: 12 (ref 5–15)
BUN: 14 mg/dL (ref 8–23)
CO2: 24 mmol/L (ref 22–32)
Calcium: 9.9 mg/dL (ref 8.9–10.3)
Chloride: 100 mmol/L (ref 98–111)
Creatinine, Ser: 0.57 mg/dL (ref 0.44–1.00)
GFR calc Af Amer: >60 mL/min (ref >60)
GFR calc non Af Amer: >60 mL/min (ref >60)
Glucose, Bld: 136 mg/dL — ABNORMAL HIGH (ref 70–99)
Potassium: 3.8 mmol/L (ref 3.5–5.1)
Sodium: 136 mmol/L (ref 135–145)
Total Bilirubin: 1.1 mg/dL (ref 0.3–1.2)
Total Protein: 7.4 g/dL (ref 6.5–8.1)

## 2019-11-20 LAB — CBC WITH DIFFERENTIAL/PLATELET
Abs Immature Granulocytes: 0.03 10*3/uL (ref 0.00–0.07)
Basophils Absolute: 0 10*3/uL (ref 0.0–0.1)
Basophils Relative: 1 %
Eosinophils Absolute: 0.2 10*3/uL (ref 0.0–0.5)
Eosinophils Relative: 2 %
HCT: 43.4 % (ref 36.0–46.0)
Hemoglobin: 14.2 g/dL (ref 12.0–15.0)
Immature Granulocytes: 0 %
Lymphocytes Relative: 22 %
Lymphs Abs: 1.8 10*3/uL (ref 0.7–4.0)
MCH: 29.5 pg (ref 26.0–34.0)
MCHC: 32.7 g/dL (ref 30.0–36.0)
MCV: 90.2 fL (ref 80.0–100.0)
Monocytes Absolute: 0.5 10*3/uL (ref 0.1–1.0)
Monocytes Relative: 6 %
Neutro Abs: 5.9 10*3/uL (ref 1.7–7.7)
Neutrophils Relative %: 69 %
Platelets: 268 10*3/uL (ref 150–400)
RBC: 4.81 MIL/uL (ref 3.87–5.11)
RDW: 14.8 % (ref 11.5–15.5)
WBC: 8.6 10*3/uL (ref 4.0–10.5)
nRBC: 0 % (ref 0.0–0.2)

## 2019-11-20 LAB — URINALYSIS, ROUTINE W REFLEX MICROSCOPIC
Glucose, UA: NEGATIVE mg/dL
Ketones, ur: 20 mg/dL — AB
Nitrite: NEGATIVE
Protein, ur: 100 mg/dL — AB
RBC / HPF: >50 RBC/hpf — ABNORMAL HIGH (ref 0–5)
Specific Gravity, Urine: 1.019 (ref 1.005–1.030)
WBC, UA: >50 WBC/hpf — ABNORMAL HIGH (ref 0–5)
pH: 5 (ref 5.0–8.0)

## 2019-11-20 LAB — ETHANOL: Alcohol, Ethyl (B): <10 mg/dL (ref <10)

## 2019-11-20 MED ORDER — DOCUSATE SODIUM 100 MG PO CAPS
100.0000 mg | ORAL_CAPSULE | Freq: Two times a day (BID) | ORAL | Status: DC
  Administered 2019-11-22: 100 mg via ORAL
  Filled 2019-11-20 (×2): qty 1

## 2019-11-20 MED ORDER — METFORMIN HCL ER 500 MG PO TB24
1000.0000 mg | ORAL_TABLET | Freq: Every day | ORAL | Status: DC
  Filled 2019-11-20 (×2): qty 2

## 2019-11-20 MED ORDER — IRBESARTAN 150 MG PO TABS
300.0000 mg | ORAL_TABLET | Freq: Every day | ORAL | Status: DC
  Filled 2019-11-20: qty 1

## 2019-11-20 MED ORDER — HYDROXYZINE HCL 10 MG PO TABS
10.0000 mg | ORAL_TABLET | Freq: Three times a day (TID) | ORAL | Status: DC | PRN
  Filled 2019-11-20 (×2): qty 1

## 2019-11-20 MED ORDER — RISPERIDONE 0.5 MG PO TABS
0.5000 mg | ORAL_TABLET | Freq: Every day | ORAL | Status: DC
  Administered 2019-11-22 – 2019-11-23 (×2): 0.5 mg via ORAL
  Filled 2019-11-20 (×3): qty 1
  Filled 2019-11-20 (×2): qty 2

## 2019-11-20 MED ORDER — PANTOPRAZOLE SODIUM 40 MG PO TBEC
40.0000 mg | DELAYED_RELEASE_TABLET | Freq: Two times a day (BID) | ORAL | Status: DC
  Administered 2019-11-22: 40 mg via ORAL
  Filled 2019-11-20 (×2): qty 1

## 2019-11-20 MED ORDER — ASPIRIN EC 81 MG PO TBEC: 81.0000 mg | DELAYED_RELEASE_TABLET | Freq: Every day | ORAL | Status: DC

## 2019-11-20 MED ORDER — ATORVASTATIN CALCIUM 40 MG PO TABS
40.0000 mg | ORAL_TABLET | Freq: Every day | ORAL | Status: DC
  Administered 2019-11-22: 21:00:00 40 mg via ORAL
  Filled 2019-11-20 (×3): qty 1

## 2019-11-20 MED ORDER — ENSURE ENLIVE PO LIQD
120.0000 mL | Freq: Two times a day (BID) | ORAL | Status: DC
  Administered 2019-11-22 – 2019-11-23 (×2): 120 mL via ORAL
  Filled 2019-11-20 (×3): qty 237

## 2019-11-20 MED ORDER — DIVALPROEX SODIUM 125 MG PO CSDR
125.0000 mg | DELAYED_RELEASE_CAPSULE | Freq: Two times a day (BID) | ORAL | Status: DC
  Administered 2019-11-22 – 2019-11-23 (×2): 125 mg via ORAL
  Filled 2019-11-20 (×5): qty 1

## 2019-11-20 MED ORDER — SERTRALINE HCL 25 MG PO TABS: 25.0000 mg | ORAL_TABLET | Freq: Every day | ORAL | Status: DC

## 2019-11-20 NOTE — ED Provider Notes (Signed)
[place] DEPT Provider Note   CSN: [id number] Arrival date & time: 11/20/19  9326     History Chief Complaint  Patient presents with  . Psychiatric Evaluation    Jeanette Rose is a 64 y.o. female.  HPI   Patient presents ED for evaluation of depression.  Patient has history several medical problems as indicated below.  She is a resident of a nursing facility, [facility].  Patient at baseline is nonambulatory.  She also has an indwelling suprapubic catheter.  Patient states she has been having trouble with depression.  She has been refusing to eat food, take her medicines or drink.  She denies suicidal homicidal ideation.  Patient states she is very tearful and upset and states to please stop asking her questions.  Past Medical History:  Diagnosis Date  . Acute acalculous cholecystitis s/p lap chole 04/03/2014 04/02/2014  . Allergy   . Anemia   . Anxiety   . Asthma   . Depression   . Fatty liver    noted on CT per [facility] records  . Fibromyalgia   . GERD (gastroesophageal reflux disease)   . Hyperlipidemia associated with type 2 diabetes mellitus ([place])   . Hypertension   . Hypothyroid 2015   Transiently in 2015. Synthroid 56mg was stopped 05/07/2016 w/ tsh nml following.  . Obesity (BMI 30-39.9)   . OSA (obstructive sleep apnea)    non-compliant with CPAP  . Seborrheic dermatitis    [facility]  . Stroke ([facility]    TIA  . Type 2 diabetes mellitus ([facility]     Patient Active Problem List   Diagnosis Date Noted  . Elevated CPK 10/14/2019  . Sacral decubitus ulcer, stage II ([place] 10/14/2019  . Acute metabolic encephalopathy 071/24/5809 . Sepsis secondary to UTI ([facility] [place] 10/13/2019  . Generalized anxiety disorder 03/04/2019  . Benzodiazepine dependence, continuous ([place] 03/04/2019  . Positive colorectal cancer screening using Cologuard test 02/15/2018  . Seborrheic dermatitis   . History of  hypothyroidism 10/26/2017  . Elevated ferritin level 10/26/2017  . Vitamin D deficiency 10/26/2017  . Diabetic peripheral neuropathy associated with type 2 diabetes mellitus ([place] 10/26/2017  . OSA (obstructive sleep apnea)   . Hyperlipidemia LDL goal <100   . Major depressive disorder, recurrent episode, moderate ([place]   . Constipation 04/22/2014  . Steatohepatitis 04/03/2014  . Anxiety   . Type 2 diabetes mellitus with diabetic polyneuropathy, with long-term current use of insulin ([place]   . GERD (gastroesophageal reflux disease)   . Hypertension   . Obesity (BMI 30-39.9)     Past Surgical History:  Procedure Laterality Date  . BREAST REDUCTION SURGERY  06/2008  . CHOLECYSTECTOMY N/A 04/03/2014   Procedure: LAPAROSCOPIC CHOLECYSTECTOMY ;  Surgeon: SAdin Hector MD;  Location: [facility]S;  Service: General;  Laterality: N/A;  . REDUCTION MAMMAPLASTY    . TUBAL LIGATION       OB History   No obstetric history on file.     Family History  Problem Relation Age of Onset  . Atrial fibrillation Mother   . Heart disease Father        CAD  . Diabetes Brother   . Cancer Maternal Grandmother   . Diabetes Paternal Grandmother   . Fibromyalgia Sister   . Colitis Sister     Social History   Tobacco Use  . Smoking status: Former Smoker    Packs/day: 4.00    Years:  15.00    Pack years: 60.00    Types: Cigarettes    Quit date: 04/22/2004    Years since quitting: 15.5  . Smokeless tobacco: Never Used  . Tobacco comment: 4 pack year hx  Substance Use Topics  . Alcohol use: No  . Drug use: No    Home Medications Prior to Admission medications   Medication Sig Start Date End Date Taking? Authorizing Provider  Amino Acids-Protein Hydrolys (FEEDING SUPPLEMENT, PRO-STAT SUGAR FREE 64,) LIQD Take 30 mLs by mouth in the morning and at bedtime.   Yes [provider]  aspirin 81 MG EC tablet Take 1 tablet (81 mg total) by mouth daily. Swallow whole. 09/27/19  Yes Money,  Lowry Ram, FNP  atorvastatin (LIPITOR) 40 MG tablet Take 1 tablet (40 mg total) by mouth at bedtime. 10/18/19 11/20/19 Yes Kayleen Memos, DO  divalproex (DEPAKOTE SPRINKLE) 125 MG capsule Take 125 mg by mouth 2 (two) times daily.   Yes [provider]  docusate sodium (COLACE) 100 MG capsule Take 1 capsule (100 mg total) by mouth 2 (two) times daily. 09/27/19  Yes Money, Lowry Ram, FNP  hydrOXYzine (ATARAX/VISTARIL) 10 MG tablet Take 1 tablet (10 mg total) by mouth 3 (three) times daily as needed for anxiety. 10/18/19  Yes Hall, Carole N, DO  irbesartan (AVAPRO) 300 MG tablet Take 1 tablet (300 mg total) by mouth daily. 09/27/19  Yes Money, Lowry Ram, FNP  metFORMIN (GLUCOPHAGE-XR) 500 MG 24 hr tablet Take 1,000 mg by mouth daily with breakfast. 09/27/19  Yes [provider]  Nutritional Supplements (NUTRITIONAL DRINK) LIQD Take 120 mLs by mouth in the morning and at bedtime. Med plus 2.0   Yes [provider]  pantoprazole (PROTONIX) 40 MG tablet Take 1 tablet (40 mg total) by mouth 2 (two) times daily. 09/27/19  Yes Money, Lowry Ram, FNP  risperiDONE (RISPERDAL) 1 MG/ML oral solution Take 0.5 mg by mouth at bedtime.   Yes [provider]  sertraline (ZOLOFT) 25 MG tablet Take 1 tablet (25 mg total) by mouth daily. 10/18/19 12/17/19 Yes Kayleen Memos, DO    Allergies    Farxiga [dapagliflozin], Rexulti [brexpiprazole], and Trintellix [vortioxetine]  Review of Systems   Review of Systems  All other systems reviewed and are negative.   Physical Exam Updated Vital Signs BP 133/84   Pulse 78   Temp 98.1 F (36.7 C) (Oral)   Resp 18   SpO2 98%   Physical Exam Vitals and nursing note reviewed.  Constitutional:      Appearance: She is well-developed. She is not diaphoretic.     Comments: Chronically ill-appearing  HENT:     Head: Normocephalic and atraumatic.     Right Ear: External ear normal.     Left Ear: External ear normal.  Eyes:     General: No  scleral icterus.       Right eye: No discharge.        Left eye: No discharge.     Conjunctiva/sclera: Conjunctivae normal.  Neck:     Trachea: No tracheal deviation.  Cardiovascular:     Rate and Rhythm: Normal rate and regular rhythm.  Pulmonary:     Effort: Pulmonary effort is normal. No respiratory distress.     Breath sounds: Normal breath sounds. No stridor. No wheezing or rales.  Abdominal:     General: Bowel sounds are normal. There is no distension.     Palpations: Abdomen is soft.  Tenderness: There is no abdominal tenderness. There is no guarding or rebound.  Musculoskeletal:        General: No tenderness.     Cervical back: Neck supple.  Skin:    General: Skin is warm and dry.     Findings: No rash.  Neurological:     Mental Status: She is alert.     Cranial Nerves: No cranial nerve deficit (no facial droop, extraocular movements intact, no slurred speech).     Sensory: No sensory deficit.     Motor: Weakness present. No abnormal muscle tone or seizure activity.     Coordination: Coordination normal.     Comments: Patient has good movement of her upper extremities, no movement of her lower extremities  Psychiatric:        Mood and Affect: Affect is tearful.        Speech: Speech is delayed.        Behavior: Behavior is withdrawn. Behavior is not aggressive or hyperactive.     ED Results / Procedures / Treatments   Labs (all labs ordered are listed, but only abnormal results are displayed) Labs Reviewed  COMPREHENSIVE METABOLIC PANEL - Abnormal; Notable for the following components:      Result Value   Glucose, Bld 136 (*)    All other components within normal limits  RESPIRATORY PANEL BY RT PCR (FLU A&B, COVID)  ETHANOL  CBC WITH DIFFERENTIAL/PLATELET  RAPID URINE DRUG SCREEN, HOSP PERFORMED  URINALYSIS, ROUTINE W REFLEX MICROSCOPIC    EKG EKG Interpretation  Date/Time:  Tuesday November 20 2019 19:32:29 EST Ventricular Rate:  80 PR  Interval:  176 QRS Duration: 82 QT Interval:  352 QTC Calculation: 405 R Axis:   44 Text Interpretation: Normal sinus rhythm Normal ECG No significant change since last tracing Confirmed by Dorie Rank [phone]) on 11/20/2019 7:38:16 PM   Radiology No results found.  Procedures Procedures (including critical care time)  Medications Ordered in ED Medications  aspirin EC tablet 81 mg (has no administration in time range)  atorvastatin (LIPITOR) tablet 40 mg (has no administration in time range)  divalproex (DEPAKOTE SPRINKLE) capsule 125 mg (has no administration in time range)  docusate sodium (COLACE) capsule 100 mg (has no administration in time range)  hydrOXYzine (ATARAX/VISTARIL) tablet 10 mg (has no administration in time range)  irbesartan (AVAPRO) tablet 300 mg (has no administration in time range)  metFORMIN (GLUCOPHAGE-XR) 24 hr tablet 1,000 mg (has no administration in time range)  Nutritional Drink LIQD 120 mL (has no administration in time range)  pantoprazole (PROTONIX) EC tablet 40 mg (has no administration in time range)  risperiDONE (RISPERDAL) 1 MG/ML oral solution 0.5 mg (has no administration in time range)  sertraline (ZOLOFT) tablet 25 mg (has no administration in time range)    ED Course  I have reviewed the triage vital signs and the nursing notes.  Pertinent labs & imaging results that were available during my care of the patient were reviewed by me and considered in my medical decision making (see chart for details).  The patient has been placed in psychiatric observation due to the need to provide a safe environment for the patient while obtaining psychiatric consultation and evaluation, as well as ongoing medical and medication management to treat the patient's condition.  The patient has not been placed under full IVC at this time.  Clinical Course as of Nov 20 2311  Tue Nov 20, 2019  2300 Labs reviewed.  No abnormalities noted in  her CBC or electrolyte  panel.  Urine is pending.  Patient is medically cleared for psychiatric evaluation.   [JK]    Clinical Course User Index [JK] Dorie Rank, MD   MDM Rules/Calculators/A&P                      Pt presents with  Worsening depression.  Long history of mental health issues.  Pt is medically stable.  Will consult psychiatry. Final Clinical Impression(s) / ED Diagnoses Final diagnoses:  Depression, unspecified depression type      Dorie Rank, MD 11/20/19 2321

## 2019-11-20 NOTE — ED Notes (Signed)
[place] consult in progress

## 2019-11-20 NOTE — ED Triage Notes (Addendum)
[facility] from [facility] center.   C/o depression,crying, and poor intake.   Patient has been refusing food, drink, and medication.   Patient needs psych evaluation for depression.   Patient A/OX3 per ems   Patient denies SI or HI  Patient has suprapubic catheter.  Patient states she does not ambulate.

## 2019-11-21 ENCOUNTER — Other Ambulatory Visit: Payer: Self-pay

## 2019-11-21 DIAGNOSIS — N390 Urinary tract infection, site not specified: Principal | ICD-10-CM

## 2019-11-21 DIAGNOSIS — Z7984 Long term (current) use of oral hypoglycemic drugs: Secondary | ICD-10-CM | POA: Diagnosis not present

## 2019-11-21 DIAGNOSIS — F23 Brief psychotic disorder: Secondary | ICD-10-CM | POA: Diagnosis present

## 2019-11-21 DIAGNOSIS — L89323 Pressure ulcer of left buttock, stage 3: Secondary | ICD-10-CM | POA: Diagnosis present

## 2019-11-21 DIAGNOSIS — Z7401 Bed confinement status: Secondary | ICD-10-CM | POA: Diagnosis not present

## 2019-11-21 DIAGNOSIS — Z833 Family history of diabetes mellitus: Secondary | ICD-10-CM | POA: Diagnosis not present

## 2019-11-21 DIAGNOSIS — Z7982 Long term (current) use of aspirin: Secondary | ICD-10-CM | POA: Diagnosis not present

## 2019-11-21 DIAGNOSIS — K219 Gastro-esophageal reflux disease without esophagitis: Secondary | ICD-10-CM | POA: Diagnosis not present

## 2019-11-21 DIAGNOSIS — E039 Hypothyroidism, unspecified: Secondary | ICD-10-CM | POA: Diagnosis present

## 2019-11-21 DIAGNOSIS — Z79899 Other long term (current) drug therapy: Secondary | ICD-10-CM | POA: Diagnosis not present

## 2019-11-21 DIAGNOSIS — R319 Hematuria, unspecified: Secondary | ICD-10-CM

## 2019-11-21 DIAGNOSIS — G4733 Obstructive sleep apnea (adult) (pediatric): Secondary | ICD-10-CM | POA: Diagnosis present

## 2019-11-21 DIAGNOSIS — M797 Fibromyalgia: Secondary | ICD-10-CM | POA: Diagnosis present

## 2019-11-21 DIAGNOSIS — Z8249 Family history of ischemic heart disease and other diseases of the circulatory system: Secondary | ICD-10-CM | POA: Diagnosis not present

## 2019-11-21 DIAGNOSIS — G9341 Metabolic encephalopathy: Secondary | ICD-10-CM | POA: Diagnosis not present

## 2019-11-21 DIAGNOSIS — R1310 Dysphagia, unspecified: Secondary | ICD-10-CM | POA: Diagnosis not present

## 2019-11-21 DIAGNOSIS — E785 Hyperlipidemia, unspecified: Secondary | ICD-10-CM | POA: Diagnosis not present

## 2019-11-21 DIAGNOSIS — Z809 Family history of malignant neoplasm, unspecified: Secondary | ICD-10-CM | POA: Diagnosis not present

## 2019-11-21 DIAGNOSIS — Z794 Long term (current) use of insulin: Secondary | ICD-10-CM | POA: Diagnosis not present

## 2019-11-21 DIAGNOSIS — L89153 Pressure ulcer of sacral region, stage 3: Secondary | ICD-10-CM | POA: Diagnosis not present

## 2019-11-21 DIAGNOSIS — Z743 Need for continuous supervision: Secondary | ICD-10-CM | POA: Diagnosis not present

## 2019-11-21 DIAGNOSIS — I10 Essential (primary) hypertension: Secondary | ICD-10-CM | POA: Diagnosis not present

## 2019-11-21 DIAGNOSIS — Z8673 Personal history of transient ischemic attack (TIA), and cerebral infarction without residual deficits: Secondary | ICD-10-CM | POA: Diagnosis not present

## 2019-11-21 DIAGNOSIS — M6281 Muscle weakness (generalized): Secondary | ICD-10-CM | POA: Diagnosis not present

## 2019-11-21 DIAGNOSIS — E1142 Type 2 diabetes mellitus with diabetic polyneuropathy: Secondary | ICD-10-CM | POA: Diagnosis not present

## 2019-11-21 DIAGNOSIS — E1169 Type 2 diabetes mellitus with other specified complication: Secondary | ICD-10-CM | POA: Diagnosis present

## 2019-11-21 DIAGNOSIS — Z888 Allergy status to other drugs, medicaments and biological substances status: Secondary | ICD-10-CM | POA: Diagnosis not present

## 2019-11-21 DIAGNOSIS — Z87891 Personal history of nicotine dependence: Secondary | ICD-10-CM | POA: Diagnosis not present

## 2019-11-21 DIAGNOSIS — M2550 Pain in unspecified joint: Secondary | ICD-10-CM | POA: Diagnosis not present

## 2019-11-21 DIAGNOSIS — B9689 Other specified bacterial agents as the cause of diseases classified elsewhere: Secondary | ICD-10-CM | POA: Diagnosis not present

## 2019-11-21 DIAGNOSIS — F329 Major depressive disorder, single episode, unspecified: Secondary | ICD-10-CM | POA: Diagnosis not present

## 2019-11-21 DIAGNOSIS — L89313 Pressure ulcer of right buttock, stage 3: Secondary | ICD-10-CM | POA: Diagnosis present

## 2019-11-21 DIAGNOSIS — F323 Major depressive disorder, single episode, severe with psychotic features: Secondary | ICD-10-CM | POA: Diagnosis present

## 2019-11-21 DIAGNOSIS — Z20822 Contact with and (suspected) exposure to covid-19: Secondary | ICD-10-CM | POA: Diagnosis present

## 2019-11-21 LAB — RAPID URINE DRUG SCREEN, HOSP PERFORMED
Amphetamines: NOT DETECTED
Barbiturates: NOT DETECTED
Benzodiazepines: POSITIVE — AB
Cocaine: NOT DETECTED
Opiates: NOT DETECTED
Tetrahydrocannabinol: NOT DETECTED

## 2019-11-21 LAB — RESPIRATORY PANEL BY RT PCR (FLU A&B, COVID)
Influenza A by PCR: NEGATIVE
Influenza B by PCR: NEGATIVE
SARS Coronavirus 2 by RT PCR: NEGATIVE

## 2019-11-21 LAB — CBG MONITORING, ED: Glucose-Capillary: 109 mg/dL — ABNORMAL HIGH (ref 70–99)

## 2019-11-21 LAB — HEMOGLOBIN A1C
Hgb A1c MFr Bld: 6.4 % — ABNORMAL HIGH (ref 4.8–5.6)
Mean Plasma Glucose: 136.98 mg/dL

## 2019-11-21 LAB — TSH: TSH: 1.629 u[IU]/mL (ref 0.350–4.500)

## 2019-11-21 LAB — GLUCOSE, CAPILLARY: Glucose-Capillary: 97 mg/dL (ref 70–99)

## 2019-11-21 MED ORDER — SENNA 8.6 MG PO TABS
1.0000 | ORAL_TABLET | Freq: Two times a day (BID) | ORAL | Status: DC
  Administered 2019-11-22: 8.6 mg via ORAL
  Filled 2019-11-21 (×2): qty 1

## 2019-11-21 MED ORDER — PRO-STAT SUGAR FREE PO LIQD
30.0000 mL | Freq: Two times a day (BID) | ORAL | Status: DC
  Administered 2019-11-22: 21:00:00 30 mL via ORAL
  Filled 2019-11-21: qty 30

## 2019-11-21 MED ORDER — CEFTRIAXONE SODIUM 1 G IJ SOLR
1.0000 g | Freq: Once | INTRAMUSCULAR | Status: AC
  Administered 2019-11-21: 1 g via INTRAMUSCULAR
  Filled 2019-11-21: qty 10

## 2019-11-21 MED ORDER — ACETAMINOPHEN 325 MG PO TABS: 650.0000 mg | ORAL_TABLET | Freq: Four times a day (QID) | ORAL | Status: DC | PRN

## 2019-11-21 MED ORDER — ONDANSETRON HCL 4 MG/2ML IJ SOLN: 4.0000 mg | Freq: Four times a day (QID) | INTRAMUSCULAR | Status: DC | PRN

## 2019-11-21 MED ORDER — MAGNESIUM HYDROXIDE 400 MG/5ML PO SUSP: 30.0000 mL | Freq: Every day | ORAL | Status: DC | PRN

## 2019-11-21 MED ORDER — ENOXAPARIN SODIUM 40 MG/0.4ML ~~LOC~~ SOLN
40.0000 mg | SUBCUTANEOUS | Status: DC
  Administered 2019-11-22: 40 mg via SUBCUTANEOUS
  Filled 2019-11-21: qty 0.4

## 2019-11-21 MED ORDER — ONDANSETRON HCL 4 MG PO TABS: 4.0000 mg | ORAL_TABLET | Freq: Four times a day (QID) | ORAL | Status: DC | PRN

## 2019-11-21 MED ORDER — SODIUM CHLORIDE 0.9 % IV SOLN
1.0000 g | INTRAVENOUS | Status: DC
  Administered 2019-11-22 – 2019-11-24 (×3): 1 g via INTRAVENOUS
  Filled 2019-11-21 (×2): qty 1
  Filled 2019-11-21: qty 10
  Filled 2019-11-21: qty 1

## 2019-11-21 MED ORDER — STERILE WATER FOR INJECTION IJ SOLN
INTRAMUSCULAR | Status: AC
  Administered 2019-11-21: 05:00:00 2.1 mL
  Filled 2019-11-21: qty 10

## 2019-11-21 MED ORDER — SORBITOL 70 % SOLN
30.0000 mL | Freq: Every day | Status: DC | PRN
  Filled 2019-11-21: qty 30

## 2019-11-21 MED ORDER — DIPHENHYDRAMINE HCL 50 MG/ML IJ SOLN: 25.0000 mg | Freq: Once | INTRAMUSCULAR | Status: DC

## 2019-11-21 MED ORDER — INSULIN ASPART 100 UNIT/ML ~~LOC~~ SOLN: 0.0000 [IU] | Freq: Every day | SUBCUTANEOUS | Status: DC

## 2019-11-21 MED ORDER — LORAZEPAM 2 MG/ML IJ SOLN: 1.0000 mg | Freq: Once | INTRAMUSCULAR | Status: DC

## 2019-11-21 MED ORDER — ACETAMINOPHEN 650 MG RE SUPP: 650.0000 mg | Freq: Four times a day (QID) | RECTAL | Status: DC | PRN

## 2019-11-21 MED ORDER — SODIUM CHLORIDE 0.9% FLUSH
3.0000 mL | Freq: Two times a day (BID) | INTRAVENOUS | Status: DC
  Administered 2019-11-21 – 2019-11-24 (×2): 3 mL via INTRAVENOUS

## 2019-11-21 MED ORDER — FLEET ENEMA 7-19 GM/118ML RE ENEM: 1.0000 | ENEMA | Freq: Once | RECTAL | Status: DC | PRN

## 2019-11-21 MED ORDER — SODIUM CHLORIDE 0.9 % IV SOLN: 1.0000 g | Freq: Once | INTRAVENOUS | Status: DC

## 2019-11-21 MED ORDER — SODIUM CHLORIDE 0.9 % IV SOLN: INTRAVENOUS | Status: DC

## 2019-11-21 MED ORDER — OMEPRAZOLE 20 MG PO CPDR: 20.0000 mg | DELAYED_RELEASE_CAPSULE | Freq: Every day | ORAL | 0 refills | Status: AC

## 2019-11-21 MED ORDER — INSULIN ASPART 100 UNIT/ML ~~LOC~~ SOLN: 0.0000 [IU] | Freq: Three times a day (TID) | SUBCUTANEOUS | Status: DC

## 2019-11-21 MED ORDER — CEPHALEXIN 500 MG PO CAPS: 500.0000 mg | ORAL_CAPSULE | Freq: Two times a day (BID) | ORAL | Status: DC

## 2019-11-21 NOTE — ED Notes (Signed)
Pt refusing all medications and oral intake.

## 2019-11-21 NOTE — ED Notes (Signed)
Pt refusing all medications. Pt refusing to drink when offered.

## 2019-11-21 NOTE — ED Notes (Signed)
I fed patient about 3 table spoons of strawberry jello and she stated her throat was sore as to why she was not eating.

## 2019-11-21 NOTE — ED Provider Notes (Signed)
Patient was cleared by psychiatry yesterday.  Patient has not been eating or drinking per nursing staff.  Her urine shows infection.  I spoke with the patient.  She informs me that she is unable to swallow because of difficulty and pain with swallowing.  She has no history of stroke and no other neuro symptoms.  She is on psych medications therefore oropharyngeal dystonia could be a possibility.   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  Ativan and Benadryl ordered for now.  IV ceftriaxone ordered for her UTI.  I discussed the case with the patient's sister [name] at [phone] -she informs me that patient has not been eating or drinking since Friday.  Medicine to admit. Psych has been reconsulted.   Varney Biles, MD 11/21/19 1517

## 2019-11-21 NOTE — ED Notes (Signed)
Patient repositioned in bed.

## 2019-11-21 NOTE — ED Notes (Signed)
Pt refusing IV and medications. Nanavati MD made aware.

## 2019-11-21 NOTE — ED Notes (Addendum)
After receiving encouragement, pt had half a cup of water.

## 2019-11-21 NOTE — ED Notes (Signed)
[facility] called for transport.  

## 2019-11-21 NOTE — Discharge Instructions (Addendum)
All the results in the ER are normal, labs and imaging.  Psychiatry team has seen Jeanette Rose and cleared her. We are not sure what is causing your symptoms.  She is having complains of difficulty with swallowing.  Please start giving her the medications prescribed and have her follow-up with a GI doctor if the symptoms continue.

## 2019-11-21 NOTE — H&P (Signed)
[facility] History and Physical  [name] OVF:[id number] DOB: [birth year] DOA: 11/20/2019 Referring physician: ED PCP: Salvatore Marvel, PA-C  Chief Complaint: Refusing meds, poor oral intake, crying Came from: [facility] ------------------------------------------------------------------------------------------------------ Assessment/Plan: Active Problems:   UTI (urinary tract infection)  Urinary tract infection Acute urinary retention -Urinalysis showed turbid amber-colored urine with large amount hemoglobin large amount of leukocytes, 100 protein, many bacteria and more than 50 WBC. -Patient reports urinary retention in the facility requiring Foley catheter placement few days ago. -IV Rocephin started in the ED which I will continue for now. -Wait for urine culture report.  Major depression with acute psychosis History of depression, anxiety, fibromyalgia -For last several days, patient has been complaining that she cannot swallow anything because of throat closing sensation.  Providers at the facility examined her and did not find anything wrong with the throat.  Patient however would not agree with assessment and continued to refuse any intake.   -Patient has history of major depression with psychosis and required ECT in the past.  In last hospitalization, patient was started on Zoloft and hydroxyzine.  Patient sister recalls that the psychiatrist at the place started on Seroquel after which she had some noticeable improvements.  For some reason, she was later switched to Risperdal. -UTI and urinary retention could have caused a flareup of psychosis.  At this time, she has poor affect, not restless or agitated but refusing to eat, drink or allow to be medicated.   -She was seen by psychiatry in the ED. Inpatient criteria was not met because patient is not suicidal or homicidal.  Patient seems to be in major depression and requires further medication adjustment.  ED  attending reconsulted psychiatry at my request but apparently they will not be able to see her till tomorrow. -Home meds include Depakote, hydroxyzine, Zoloft, Risperdal.  -Continue the same for now.  Cardiovascular issues: Hypertension, hyperlipidemia, stroke/TIA -Continue aspirin, statin, irbesartan  T2DM -Continue Metformin.  Insulin sliding scale with Accu-Cheks. -Obtain A1c.  OSA -Noncompliant to CPAP  Hypothyroidism -Mentioned in the chart.  I do not see Synthroid in her list of medicine.  Obtain TSH  GERD -Protonix  Impaired mobility -PT evaluation ordered  DVT prophylaxis:  Lovenox subcu Antimicrobials:  IV Rocephin Fluid: Normal saline at 75 mL/h Diet: Regular diet  Code Status:  Full code Mobility: PT eval ordered Family Communication:  Called and updated patient's sister Jeanette Rose this afternoon. Discharge plan: More than 2 midnight stay.  May need psychiatric facility placement  Consultants:  Psychiatry  ----------------------------------------------------------------------------------------------------- History of Present Illness: Jeanette Rose is a 64 y.o. female with PMH significant for HTN, T2DM, OSA, stroke/TIA, hypothyroidism, GERD, fibromyalgia, anxiety, depression.   Patient was sent to the ED from SNF for poor oral intake, refusing food, drink and medication. Patient is a poor historian and answers only 'I don't know' to most of the questions.  I called and discussed with patient's sister Jeanette Rose who is next of kin, patient does not have any designated medical power of attorney. For last few months, patient's depression has been getting worse.  She was living alone and was hospitalized couple of times for behavioral symptoms.  She also required ECT in the past for depression with psychosis.  Last month, she was found at home, in her couch with urinary incontinence, apparently catatonic, not up from couch for 5 days. She was hospitalized 1/16-1/21,  treated for UTI.  Her medicines were adjusted and she was discharged to  [facility] for long-term placement.  Patient sister recalls that the psychiatrist at the place started on Seroquel after which she had some noticeable improvements.  For some reason, she was later switched to Risperdal. For last several days, patient has been complaining that she cannot swallow anything because of throat closing sensation.  Providers at the facility examined her and did not find anything wrong with the throat.  Patient however would not agree with assessment and continued to refuse any intake.  She also had a urinary retention and hence a Foley catheter was placed few days ago.  Patient was sent to the ED on 2/23 for further evaluation and management.   In the ED, patient has been afebrile, hemodynamically stable.  Breathing comfortably on room air. CBC and BMP essentially normal Urinalysis showed turbid amber-colored urine with large amount hemoglobin large amount of leukocytes, 100 protein, many bacteria and more than 50 WBC.  Patient was seen by behavioral health counselor in the ED.  Patient did not have suicidal or homicidal ideation and hence she was not admitted to behavioral health inpatient unit.  Follow-up with psychiatry as an outpatient was recommended.  I do not see any adjustment in medication done either.  Hospitalist service was called this afternoon for inpatient admission and management.  At the time of my evaluation, patient was tearful.  Not in physical pain.  She is alert and awake but would not make eye contact.  Oriented to place and person.  She says I do not know to most of the questions.    Denies any burning urination, lower abdominal pain, fever.  Review of Systems:  All systems were reviewed and were negative unless otherwise mentioned in the HPI   Past medical history: Past Medical History:  Diagnosis Date  . Acute acalculous cholecystitis s/p lap chole 04/03/2014  04/02/2014  . Allergy   . Anemia   . Anxiety   . Asthma   . Depression   . Fatty liver    noted on CT per [facility] records  . Fibromyalgia   . GERD (gastroesophageal reflux disease)   . Hyperlipidemia associated with type 2 diabetes mellitus ([place])   . Hypertension   . Hypothyroid 2015   Transiently in 2015. Synthroid 80mg was stopped 05/07/2016 w/ tsh nml following.  . Obesity (BMI 30-39.9)   . OSA (obstructive sleep apnea)    non-compliant with CPAP  . Seborrheic dermatitis    [facility]  . Stroke ([facility]    TIA  . Type 2 diabetes mellitus (HCC)     Past surgical history: Past Surgical History:  Procedure Laterality Date  . BREAST REDUCTION SURGERY  06/2008  . CHOLECYSTECTOMY N/A 04/03/2014   Procedure: LAPAROSCOPIC CHOLECYSTECTOMY ;  Surgeon: SAdin Hector MD;  Location: [facility];  Service: General;  Laterality: N/A;  . REDUCTION MAMMAPLASTY    . TUBAL LIGATION      Social History:  reports that she quit smoking about 15 years ago. Her smoking use included cigarettes. She has a 60.00 pack-year smoking history. She has never used smokeless tobacco. She reports that she does not drink alcohol or use drugs.  Allergies:  Allergies  Allergen Reactions  . Farxiga [Dapagliflozin] Anxiety and Other (See Comments)    Made depression and anxiety worse  . Rexulti [Brexpiprazole] Anxiety and Other (See Comments)    Made anxiety worse  . Trintellix [Vortioxetine] Anxiety and Other (See Comments)    Made anxiety  worse    Family history:  Family History  Problem Relation Age of Onset  . Atrial fibrillation Mother   . Heart disease Father        CAD  . Diabetes Brother   . Cancer Maternal Grandmother   . Diabetes Paternal Grandmother   . Fibromyalgia Sister   . Colitis Sister      Home Meds: Prior to Admission medications   Medication Sig Start Date End Date Taking? Authorizing Provider  Amino Acids-Protein Hydrolys (FEEDING SUPPLEMENT,  PRO-STAT SUGAR FREE 64,) LIQD Take 30 mLs by mouth in the morning and at bedtime.   Yes [provider]  aspirin 81 MG EC tablet Take 1 tablet (81 mg total) by mouth daily. Swallow whole. 09/27/19  Yes Money, Lowry Ram, FNP  atorvastatin (LIPITOR) 40 MG tablet Take 1 tablet (40 mg total) by mouth at bedtime. 10/18/19 11/20/19 Yes Kayleen Memos, DO  divalproex (DEPAKOTE SPRINKLE) 125 MG capsule Take 125 mg by mouth 2 (two) times daily.   Yes [provider]  docusate sodium (COLACE) 100 MG capsule Take 1 capsule (100 mg total) by mouth 2 (two) times daily. 09/27/19  Yes Money, Lowry Ram, FNP  hydrOXYzine (ATARAX/VISTARIL) 10 MG tablet Take 1 tablet (10 mg total) by mouth 3 (three) times daily as needed for anxiety. 10/18/19  Yes Hall, Carole N, DO  irbesartan (AVAPRO) 300 MG tablet Take 1 tablet (300 mg total) by mouth daily. 09/27/19  Yes Money, Lowry Ram, FNP  metFORMIN (GLUCOPHAGE-XR) 500 MG 24 hr tablet Take 1,000 mg by mouth daily with breakfast. 09/27/19  Yes [provider]  Nutritional Supplements (NUTRITIONAL DRINK) LIQD Take 120 mLs by mouth in the morning and at bedtime. Med plus 2.0   Yes [provider]  pantoprazole (PROTONIX) 40 MG tablet Take 1 tablet (40 mg total) by mouth 2 (two) times daily. 09/27/19  Yes Money, Lowry Ram, FNP  risperiDONE (RISPERDAL) 1 MG/ML oral solution Take 0.5 mg by mouth at bedtime.   Yes [provider]  sertraline (ZOLOFT) 25 MG tablet Take 1 tablet (25 mg total) by mouth daily. 10/18/19 12/17/19 Yes Kayleen Memos, DO  omeprazole (PRILOSEC) 20 MG capsule Take 1 capsule (20 mg total) by mouth daily. 11/21/19   Varney Biles, MD    Physical Exam: Vitals:   11/21/19 0941 11/21/19 1234 11/21/19 1330 11/21/19 1630  BP: 115/73 129/74 120/88 116/84  Pulse: 84 84 93 80  Resp: 16 16 16 18   Temp:    98.3 F (36.8 C)  TempSrc:    Oral  SpO2: 95% 94% 96%    Wt Readings from Last 3 Encounters:  10/16/19 77.4 kg  09/10/19  81.6 kg  04/01/19 77.1 kg   There is no height or weight on file to calculate BMI.  General exam: Not in physical distress.  Tearful. Skin: No rashes, lesions or ulcers. HEENT: Atraumatic, normocephalic, supple neck, no obvious bleeding Lungs: Clear to auscultation bilaterally CVS: Regular rate and rhythm, no murmur GI/Abd soft, nontender, nondistended, bowel sound present CNS: Alert, awake, oriented to place and person, not to time Psychiatry: Depressed affect.  Tearful, not in physical pain Extremities: No pedal edema, no calf tenderness     Consult Orders  (From admission, onward)         Start     Ordered   11/21/19 1637  PT eval and treat  Routine     11/21/19 1636   11/21/19 1440  Consult to hospitalist  ALL PATIENTS BEING ADMITTED/HAVING PROCEDURES NEED COVID-19 SCREENING  Once    Comments: ALL PATIENTS BEING ADMITTED/HAVING PROCEDURES NEED COVID-19 SCREENING  Provider:  (Not yet assigned)  Question Answer Comment  Place call to: [facility]   Reason for Consult Admit      11/21/19 1439          Labs on Admission:   CBC: Recent Labs  Lab 11/20/19 1850  WBC 8.6  NEUTROABS 5.9  HGB 14.2  HCT 43.4  MCV 90.2  PLT 263    Basic Metabolic Panel: Recent Labs  Lab 11/20/19 1850  NA 136  K 3.8  CL 100  CO2 24  GLUCOSE 136*  BUN 14  CREATININE 0.57  CALCIUM 9.9    Liver Function Tests: Recent Labs  Lab 11/20/19 1850  AST 24  ALT 20  ALKPHOS 89  BILITOT 1.1  PROT 7.4  ALBUMIN 4.1   No results for input(s): LIPASE, AMYLASE in the last 168 hours. No results for input(s): AMMONIA in the last 168 hours.  Cardiac Enzymes: No results for input(s): CKTOTAL, CKMB, CKMBINDEX, TROPONINI in the last 168 hours.  BNP (last 3 results) No results for input(s): BNP in the last 8760 hours.  ProBNP (last 3 results) No results for input(s): PROBNP in the last 8760 hours.  CBG: Recent Labs  Lab 11/21/19 0938  GLUCAP 109*    Lipase       Component Value Date/Time   LIPASE 23 11/03/2017 1513     Urinalysis    Component Value Date/Time   COLORURINE AMBER (A) 11/20/2019 2239   APPEARANCEUR TURBID (A) 11/20/2019 2239   LABSPEC 1.019 11/20/2019 2239   PHURINE 5.0 11/20/2019 2239   GLUCOSEU NEGATIVE 11/20/2019 2239   HGBUR LARGE (A) 11/20/2019 2239   BILIRUBINUR SMALL (A) 11/20/2019 2239   BILIRUBINUR small (A) 01/05/2018 1232   KETONESUR 20 (A) 11/20/2019 2239   PROTEINUR 100 (A) 11/20/2019 2239   UROBILINOGEN 1.0 01/05/2018 1232   UROBILINOGEN 2.0 (H) 04/05/2014 1208   NITRITE NEGATIVE 11/20/2019 2239   LEUKOCYTESUR LARGE (A) 11/20/2019 2239     Drugs of Abuse     Component Value Date/Time   LABOPIA NONE DETECTED 11/20/2019 2239   COCAINSCRNUR NONE DETECTED 11/20/2019 2239   LABBENZ POSITIVE (A) 11/20/2019 2239   AMPHETMU NONE DETECTED 11/20/2019 2239   THCU NONE DETECTED 11/20/2019 2239   LABBARB NONE DETECTED 11/20/2019 2239      Radiological Exams on Admission: No results found. ----------------------------------------------------------------------------------------------------------------------------------------------------------- Severity of Illness: The appropriate patient status for this patient is INPATIENT. Inpatient status is judged to be reasonable and necessary in order to provide the required intensity of service to ensure the patient's safety. The patient's presenting symptoms, physical exam findings, and initial radiographic and laboratory data in the context of their chronic comorbidities is felt to place them at high risk for further clinical deterioration. Furthermore, it is not anticipated that the patient will be medically stable for discharge from the hospital within 2 midnights of admission. The following factors support the patient status of inpatient.   " The patient's presenting symptoms include poor oral intake, refusing meds, or affect. " The worrisome physical exam findings include  poor affect. " The initial radiographic and laboratory data are worrisome because of urinary tract infection. " The chronic co-morbidities include depression.   * I certify that at the point of admission it is my clinical judgment that the patient will require inpatient hospital care spanning beyond 2 midnights from  the point of admission due to high intensity of service, high risk for further deterioration and high frequency of surveillance required.*   Signed, Terrilee Croak, MD [facility] 11/21/2019

## 2019-11-21 NOTE — ED Notes (Signed)
Bed linens changed. New brief applied. Foley emptied. Extra pillows provided to promote comfort with bed sore. Pt repositioned.

## 2019-11-21 NOTE — ED Notes (Signed)
Patient refused breakfast tray

## 2019-11-21 NOTE — BH Assessment (Addendum)
Tele Assessment Note   Patient Name: Jeanette Rose MRN: [id number] Referring Physician: Dr. Dorie Rank Location of Patient: [name] Location of Provider: [place]  Chole Driver is an 64 y.o. female presenting to the ED with worsening depression. When asked about stressors/triggers of depression, patient stated "I don't know". When asked how can we help you today, patient stated "I don't know". Patient reported normally feeling sad. Patient denied SI, HI and psychosis. Patient reported no sleep and poor appetite. Patient reported worsening depressive symptoms, crying spells, feelings of guilt, isolating and loss of interest. Patient was not able to recall some of the information needed for assessment. Patient was cooperative during assessment. Per triage note, patient is a resident of [facility]. Patient baseline is nonambulatory.   Patient reported living alone in [place] and receives disability income.  Per history, Pt was followed by Dr. Toy Care, and Dr. Toy Care has referred for a second opinion.  Per history, Pt's son died in 25-Nov-2009, and Pt continues to grieve.  Pt was last treated inpatient at [facility] in December 2021.  PER EDP NOTE: Patient has history several medical problems as indicated below.  She is a resident of a nursing facility, [facility].  Patient at baseline is nonambulatory.  She also has an indwelling suprapubic catheter.  Patient states she has been having trouble with depression.  She has been refusing to eat food, take her medicines or drink.  She denies suicidal homicidal ideation.  Patient states she is very tearful and upset and states to please stop asking her questions.  Collateral Contact: Patient denied  Diagnosis: Major depressive disorder  Past Medical History:  Past Medical History:  Diagnosis Date  . Acute acalculous cholecystitis s/p lap chole 04/03/2014 04/02/2014  . Allergy   . Anemia   . Anxiety   . Asthma   .  Depression   . Fatty liver    noted on CT per [facility] records  . Fibromyalgia   . GERD (gastroesophageal reflux disease)   . Hyperlipidemia associated with type 2 diabetes mellitus ([place])   . Hypertension   . Hypothyroid November 25, 2013   Transiently in 25-Nov-2013. Synthroid 47mg was stopped 05/07/2016 w/ tsh nml following.  . Obesity (BMI 30-39.9)   . OSA (obstructive sleep apnea)    non-compliant with CPAP  . Seborrheic dermatitis    [facility]  . St[facility] ([facility]    [facility]  . [place]     Past Surgical History:  Procedure Laterality Date  . BREAST REDUCTION SURGERY  06/2008  . CHOLECYSTECTOMY N/A 04/03/2014   Procedure: LAPAROSCOPIC CHOLECYSTECTOMY ;  Surgeon: SAdin Hector MD;  Location[facility];  Service: General;  Laterality: N/A;  . REDUCTION MAMMAPLASTY    . TUBAL LIGATION      Family History:  Family History  Problem Relation Age of Onset  . Atrial fibrillation Mother   . Heart disease Father        CAD  . Diabetes Brother   . Cancer Maternal Grandmother   . Diabetes Paternal Grandmother   . Fibromyalgia Sister   . Colitis Sister     Social History:  reports that she quit smoking about 15 years ago. Her smoking use included cigarettes. She has a 60.00 pack-year smoking history. She has never used smokeless tobacco. She reports that she does not drink alcohol or use drugs.  Additional Social History:  Alcohol / Drug Use Pain  Medications: see MAR Prescriptions: see MAR Over the Counter: see MAR  CIWA: CIWA-Ar BP: 133/84 Pulse Rate: 78 COWS:    Allergies:  Allergies  Allergen Reactions  . Farxiga [Dapagliflozin] Anxiety and Other (See Comments)    Made depression and anxiety worse  . Rexulti [Brexpiprazole] Anxiety and Other (See Comments)    Made anxiety worse  . Trintellix [Vortioxetine] Anxiety and Other (See Comments)    Made anxiety worse    Home Medications: (Not in a hospital admission)   OB/GYN Status:   No LMP recorded. Patient is postmenopausal.  General Assessment Data [facility] of [facility]: [facility] ED TTS Assessment: In system Is this a Tele or Face-to-Face Assessment?: Tele Assessment Is this an Initial Assessment or a Re-assessment for this encounter?: Initial Assessment Patient Accompanied by:: N/A Language Other than English: No Living Arrangements: Other (Comment)(resides alone) What gender do you identify as?: Female Marital status: Single Pregnancy Status: No Living Arrangements: Alone Can pt return to current living arrangement?: Yes Admission Status: Voluntary Is patient capable of signing voluntary admission?: Yes Referral Source: Self/Family/Friend  Crisis Care Plan Living Arrangements: Alone Legal Guardian: Other:(self) Name of Psychiatrist: None currently(none) Name of Therapist: None indicated(none)  Education Status Is patient currently in school?: No Is the patient employed, unemployed or receiving disability?: Receiving disability income  Risk to self with the past 6 months Suicidal Ideation: No Has patient been a risk to self within the past 6 months prior to admission? : No Suicidal Intent: No Has patient had any suicidal intent within the past 6 months prior to admission? : No Is patient at risk for suicide?: No Suicidal Plan?: No Has patient had any suicidal plan within the past 6 months prior to admission? : No Access to Means: No What has been your use of drugs/alcohol within the last 12 months?: (none) Previous Attempts/Gestures: No How many times?: (0) Other Self Harm Risks: (n/a) Triggers for Past Attempts: None known Intentional Self Injurious Behavior: None Family Suicide History: No Recent stressful life event(s): Other (Comment)("I don't know") Persecutory voices/beliefs?: No Depression: Yes Depression Symptoms: Insomnia, Tearfulness, Isolating, Fatigue, Guilt, Loss of interest in usual pleasures, Feeling worthless/self pity Substance  abuse history and/or treatment for substance abuse?: No Suicide prevention information given to non-admitted patients: Not applicable  Risk to Others within the past 6 months Homicidal Ideation: No Does patient have any lifetime risk of violence toward others beyond the six months prior to admission? : No Thoughts of Harm to Others: No Current Homicidal Intent: No Current Homicidal Plan: No Access to Homicidal Means: No History of harm to others?: No Assessment of Violence: None Noted Violent Behavior Description: (none) Does patient have access to weapons?: No Criminal Charges Pending?: No Does patient have a court date: No Is patient on probation?: No  Psychosis Hallucinations: None noted Delusions: None noted  Mental Status Report Appearance/Hygiene: Unremarkable Eye Contact: Fair Motor Activity: Freedom of movement Speech: Logical/coherent, Other (Comment)(tremorous) Level of Consciousness: Alert Mood: Depressed, Anxious Affect: Anxious, Depressed Anxiety Level: Moderate Thought Processes: Unable to Assess Judgement: Partial Orientation: Person, Place, Time, Situation Obsessive Compulsive Thoughts/Behaviors: None  Cognitive Functioning Concentration: Normal Memory: Recent Intact Is patient IDD: No Insight: Fair Impulse Control: Fair Appetite: Poor Have you had any weight changes? : No Change Sleep: Decreased Total Hours of Sleep: ("none")  ADLScreening [facility] Assessment Services) Patient's cognitive ability adequate to safely complete daily activities?: Yes Patient able to express need for assistance with ADLs?: Yes Independently performs ADLs?: Yes (appropriate for  developmental age)  Prior Inpatient Therapy Prior Inpatient Therapy: Yes Prior Therapy Dates: 08/2019 Prior Therapy Facilty/Provider(s): [facility] Reason for Treatment: Anxiety, SI.   Prior Outpatient Therapy Prior Outpatient Therapy: Yes Prior Therapy Dates: 2020 Prior Therapy  Facilty/Provider(s): Dr. Toy Care Reason for Treatment: Med mang Does patient have an ACCT team?: No Does patient have Intensive In-House Services?  : No Does patient have [facility] services? : No Does patient have P4CC services?: No  ADL Screening (condition at time of admission) Patient's cognitive ability adequate to safely complete daily activities?: Yes Patient able to express need for assistance with ADLs?: Yes Independently performs ADLs?: Yes (appropriate for developmental age)  Regulatory affairs officer (For Healthcare) Does Patient Have a Medical Advance Directive?: Unable to assess, patient is non-responsive or altered mental status   Disposition:  Disposition Initial Assessment Completed for this Encounter: Yes  Adaku Anike, NP, patient does not meet inpatient criteria. Patient to follow up with Dr. Toy Care for medication management and for outpatient therapy at [facility].   This service was provided via telemedicine using a 2-way, interactive audio and video technology.  Names of all persons participating in this telemedicine service and their role in this encounter. Name: Natonya Finstad Role: Patient  Name: Kirtland Bouchard Role: TTS Clinician  Name:  Role:   Name:  Role:     Venora Maples 11/21/2019 12:21 AM

## 2019-11-22 DIAGNOSIS — F329 Major depressive disorder, single episode, unspecified: Secondary | ICD-10-CM

## 2019-11-22 DIAGNOSIS — R1310 Dysphagia, unspecified: Secondary | ICD-10-CM

## 2019-11-22 LAB — CBC
HCT: 40.5 % (ref 36.0–46.0)
Hemoglobin: 13.3 g/dL (ref 12.0–15.0)
MCH: 29.8 pg (ref 26.0–34.0)
MCHC: 32.8 g/dL (ref 30.0–36.0)
MCV: 90.6 fL (ref 80.0–100.0)
Platelets: 229 10*3/uL (ref 150–400)
RBC: 4.47 MIL/uL (ref 3.87–5.11)
RDW: 15.1 % (ref 11.5–15.5)
WBC: 8.5 10*3/uL (ref 4.0–10.5)
nRBC: 0 % (ref 0.0–0.2)

## 2019-11-22 LAB — URINE CULTURE

## 2019-11-22 LAB — GLUCOSE, CAPILLARY
Glucose-Capillary: 98 mg/dL (ref 70–99)
Glucose-Capillary: 85 mg/dL (ref 70–99)
Glucose-Capillary: 94 mg/dL (ref 70–99)
Glucose-Capillary: 84 mg/dL (ref 70–99)

## 2019-11-22 LAB — BASIC METABOLIC PANEL
Anion gap: 11 (ref 5–15)
BUN: 18 mg/dL (ref 8–23)
CO2: 22 mmol/L (ref 22–32)
Calcium: 9.3 mg/dL (ref 8.9–10.3)
Chloride: 104 mmol/L (ref 98–111)
Creatinine, Ser: 0.44 mg/dL (ref 0.44–1.00)
GFR calc Af Amer: >60 mL/min (ref >60)
GFR calc non Af Amer: >60 mL/min (ref >60)
Glucose, Bld: 99 mg/dL (ref 70–99)
Potassium: 3.9 mmol/L (ref 3.5–5.1)
Sodium: 137 mmol/L (ref 135–145)

## 2019-11-22 MED ORDER — CHLORHEXIDINE GLUCONATE CLOTH 2 % EX PADS
6.0000 | MEDICATED_PAD | Freq: Every day | CUTANEOUS | Status: DC
  Administered 2019-11-23 – 2019-11-24 (×2): 6 via TOPICAL

## 2019-11-22 NOTE — Progress Notes (Signed)
Patient has refused to eat breakfast, to take PO medications and to have insulin administered. Attending physician notified via page.

## 2019-11-22 NOTE — Progress Notes (Signed)
Dr. Zigmund Daniel is aware of patient's status. Gave update via phone call.

## 2019-11-22 NOTE — Progress Notes (Signed)
Notified by nurse tech that patient is refusing patient care and to be repositioned in bed.

## 2019-11-22 NOTE — Progress Notes (Signed)
Notified by NT that patient has refused to eat or have patient care at this time.

## 2019-11-22 NOTE — Progress Notes (Signed)
Received inbound call from patient's sister [name] requesting an update on patient's status. Advised Mrs. Dunman that patient has been refusing medication and patient care the entire shift. Sister Helene Kelp is requesting a phone call from social work on 11/23/19 to discuss guardianship and other concerns.

## 2019-11-22 NOTE — Consult Note (Signed)
   [facility] CM Inpatient Consult   11/22/2019  Jeanette Rose [birth year] [id number]     Patient reviewed for 35% extreme high risk score for unplanned readmission with 3 hospitalizations and 2 ED visits in the past 6 months, under her [employer] benefits.   Chart review reveals MD history and physical as:  Todd Argabright is a 64 y.o. female with PMH significant for HTN, T2DM, OSA, stroke/TIA, hypothyroidism, GERD, fibromyalgia, anxiety, depression.   Patient was sent to the ED from SNF for further evaluation and management due to poor oral intake, refusing food, drink and medication. She was hospitalized 1/16-1/21, treated for UTI.  Her medicines were adjusted and she was discharged to [facility] for long-term placement.   Spoke with transition of care SW and was informed that disposition is likely to return to skilled nursing facility (SNF), NOT long term care (no payor source). Patient may need psychiatric facility placementper MD note.  If patient will transition to skilled nursing facility, no further [facility]) care management follow-up needed as patient's care will be met at the skilled level of care.  Her Primary Care Provider listed in Epic is [name]. [place], [place] with [facility] ([facility] since 03/04/2019, not a [employer] fundraiser.  Patient is currentlyNOT a beneficiary of the attributed [employer] Charity fundraiser) Registry in the [employer].  Reason:Herpresentprimary care providerisnot affiliated with [employer]  This patient is currentlyNOT coveredfor [facility] Services.    For questions and additional information, please contact:  [name] A. [name], BSN, RN-BC [facility] Liaison Cell: [phone]

## 2019-11-22 NOTE — Progress Notes (Signed)
Initial Nutrition Assessment  DOCUMENTATION CODES:   Not applicable  INTERVENTION:  Magic cup TID with meals, each supplement provides 290 kcal and 9 grams of protein (vanilla)  Encourage po intake of meals and supplements  If patient has not eaten in the next 24 - 48 hrs consider placement of small bore NGT for nutrition and meds  High refeeding risk, recommend monitoring  magnesium, potassium, and phosphorus daily for at least 3 days, MD to replete as needed   NUTRITION DIAGNOSIS:   Inadequate oral intake related to lethargy/confusion(acute psychosis) as evidenced by per patient/family report(per notes, sister of pt reports no po intake in 5 days;  pt noted refusing meals and po medications during admission).  GOAL:   Patient will meet greater than or equal to 90% of their needs    MONITOR:   PO intake, Supplement acceptance, Labs, Weight trends, I & O's  REASON FOR ASSESSMENT:   Malnutrition Screening Tool    ASSESSMENT:  64 year old female with past medical history of anxiety, depression, GERD, HTN, fibromyalgia, acute acalculous cholecystitis s/p lap chole (2015), OSA, HLD, hypothyroidism, fatty liver, seborrheic dermatitis, h/o TIA presented from SNF for evaluation of poor oral intake, meal and medication refusal and worsening of depression. Patient reports urinary retention in facility requiring Foley catheter placement a few day ago, urinalysis showed turbid amber-colored urine with large amount of Hgb and leukocytes. Patient admitted for UTI and major depression with acute psychosis.  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 Sister of pt reports that patient has bot been eating or drinking since Friday. 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 Psych consult pending.  Patient on HH/CM diet, RN noted pt refusal of breakfast and all po  medications this morning.   Patient awake and alert this afternoon. Patient acknowledged RD's presence, but did not engage in conversation. RD asked patient if she would like to share meal preferences in order to provide her with meals she liked, patient did not respond. She responded no with a closed mouth when asked if she was drinking Ensure supplement. RD asked  patient if she liked ice cream, patient responded yes by nodding her head. RD offered vanilla or chocolate flavor and with a closed mouth she stated vanilla.   Informed patient that RD would have vanilla ice cream sent on meal trays and encouraged patient to try and eat some of her food at each meal. Given patient has not been eating or drinking since Friday per report of sister, as well as stage II to buttocks noted on RN assessment, recommend initiating nutrition support if patient continues to refuse po intake over the next 24-48 hrs.   Current wt 170.28 lbs Weight history, weights have fluctuated 170-180 lbs over the past 7 months. On 04/01/19 pt wt 169.62 lbs, on 09/10/19 pt wt 179.52 lbs, on 10/16/19 pt wt 170.28 lbs.  Medications reviewed and include: Depakote, Colace, SS novolog, Ativan, Glucophage, Protonix, Risperdal, Senokot, Zoloft Rocephin Labs: CBGs 98, 97, 109 since admit Lab Results  Component Value Date   HGBA1C 6.4 (H) 11/21/2019     NUTRITION - FOCUSED PHYSICAL EXAM: Unable to complete at this time.    Diet Order:   Diet Order            Diet heart healthy/carb modified Room service appropriate? Yes; Fluid consistency: Thin  Diet effective now  EDUCATION NEEDS:   Not appropriate for education at this time  Skin:  Skin Assessment: Skin Integrity Issues: Skin Integrity Issues:: Stage III, Other (Comment) Stage III: Buttocks Other: PI; L heel  Last BM:  unknown  Height:   Ht Readings from Last 1 Encounters:  11/21/19 5' 4.02" (1.626 m)    Weight:   Wt Readings from Last 1  Encounters:  11/21/19 77.4 kg    Ideal Body Weight:  54.6 kg  BMI:  Body mass index is 29.28 kg/m.  Estimated Nutritional Needs:   Kcal:  1600-1800  Protein:  80-90  Fluid:  >/= 1.6 L/day   Lajuan Lines, RD, LDN Clinical Nutrition After Hours/Weekend Pager # in [place]

## 2019-11-22 NOTE — Consult Note (Signed)
Telepsych Consultation   Reason for Consult:  "needs reassessment" Referring Physician:  Dr Rodena Piety Location of Patient: [facility] Location of Provider: [facility]  Patient Identification: Jeanette Rose MRN:  [id number] Principal Diagnosis: <principal problem not specified> Diagnosis:  Active Problems:   Depression   UTI (urinary tract infection)   Dysphagia   Total Time spent with patient: 30 minutes  Subjective:   Jeanette Rose is a 64 y.o. female patient.  Patient assessed by nurse practitioner.  Patient appears alert.  Patient participates minimally in interview.  Patient answers "no" to many questions presented by this Probation officer, patient refuses to answer at times. At  Times patient nods head while refusing to answer verbally.  Per staff at bedside patient refusing care this shift.  HPI: Patient admitted with urinary tract infection, poor oral intake and refusing medications at care facility, [facility] health care.  Past Psychiatric History: Depression, anxiety, benzodiazepine dependence  Risk to Self: Suicidal Ideation: No Suicidal Intent: No Is patient at risk for suicide?: No Suicidal Plan?: No Access to Means: No What has been your use of drugs/alcohol within the last 12 months?: (none) How many times?: (0) Other Self Harm Risks: (n/a) Triggers for Past Attempts: None known Intentional Self Injurious Behavior: None Risk to Others: Homicidal Ideation: No Thoughts of Harm to Others: No Current Homicidal Intent: No Current Homicidal Plan: No Access to Homicidal Means: No History of harm to others?: No Assessment of Violence: None Noted Violent Behavior Description: (none) Does patient have access to weapons?: No Criminal Charges Pending?: No Does patient have a court date: No Prior Inpatient Therapy: Prior Inpatient Therapy: Yes Prior Therapy Dates: 08/2019 Prior Therapy Facilty/Provider(s): [facility] Reason for Treatment: Anxiety, SI.  Prior  Outpatient Therapy: Prior Outpatient Therapy: Yes Prior Therapy Dates: 2020 Prior Therapy Facilty/Provider(s): [facility] Reason for Treatment: Med mang Does patient have an ACCT team?: No Does patient have Intensive In-House Services?  : No Does patient have [facility] services? : No Does patient have P4CC services?: No  Past Medical History:  Past Medical History:  Diagnosis Date  . Acute acalculous cholecystitis s/p lap chole 04/03/2014 04/02/2014  . Allergy   . Anemia   . Anxiety   . Asthma   . Depression   . Fatty liver    noted on CT per [facility] records  . Fibromyalgia   . GERD (gastroesophageal reflux disease)   . Hyperlipidemia associated with type 2 diabetes mellitus ([place])   . Hypertension   . Hypothyroid 2015   Transiently in 2015. Synthroid 67mg was stopped 05/07/2016 w/ tsh nml following.  . Obesity (BMI 30-39.9)   . OSA (obstructive sleep apnea)    non-compliant with CPAP  . Seborrheic dermatitis    [facility]  . Stroke ([facility]    TIA  . Type 2 diabetes mellitus ([place]     Past Surgical History:  Procedure Laterality Date  . BREAST REDUCTION SURGERY  06/2008  . CHOLECYSTECTOMY N/A 04/03/2014   Procedure: LAPAROSCOPIC CHOLECYSTECTOMY ;  Surgeon: SAdin Hector MD;  Location: [facility] ORS;  Service: General;  Laterality: N/A;  . REDUCTION MAMMAPLASTY    . TUBAL LIGATION     Family History:  Family History  Problem Relation Age of Onset  . Atrial fibrillation Mother   . Heart disease Father        CAD  . Diabetes Brother   . Cancer Maternal Grandmother   . Diabetes Paternal Grandmother   .  Fibromyalgia Sister   . Colitis Sister    Family Psychiatric  History: Unknown Social History:  Social History   Substance and Sexual Activity  Alcohol Use No     Social History   Substance and Sexual Activity  Drug Use No    Social History   Socioeconomic History  . Marital status: Divorced    Spouse name: Not on file  . Number of  children: 2  . Years of education: Not on file  . Highest education level: GED or equivalent  Occupational History  . Occupation: Disability  Tobacco Use  . Smoking status: Former Smoker    Packs/day: 4.00    Years: 15.00    Pack years: 60.00    Types: Cigarettes    Quit date: 04/22/2004    Years since quitting: 15.5  . Smokeless tobacco: Never Used  . Tobacco comment: 4 pack year hx  Substance and Sexual Activity  . Alcohol use: No  . Drug use: No  . Sexual activity: Not Currently  Other Topics Concern  . Not on file  Social History Narrative   Pt lives alone in   Social Determinants of Health   Financial Resource Strain:   . Difficulty of Paying Living Expenses: Not on file  Food Insecurity:   . Worried About Charity fundraiser in the Last Year: Not on file  . Ran Out of Food in the Last Year: Not on file  Transportation Needs:   . Lack of Transportation (Medical): Not on file  . Lack of Transportation (Non-Medical): Not on file  Physical Activity:   . Days of Exercise per Week: Not on file  . Minutes of Exercise per Session: Not on file  Stress:   . Feeling of Stress : Not on file  Social Connections:   . Frequency of Communication with Friends and Family: Not on file  . Frequency of Social Gatherings with Friends and Family: Not on file  . Attends Religious Services: Not on file  . Active Member of Clubs or Organizations: Not on file  . Attends Archivist Meetings: Not on file  . Marital Status: Not on file   Additional Social History:    Allergies:   Allergies  Allergen Reactions  . Farxiga [Dapagliflozin] Anxiety and Other (See Comments)    Made depression and anxiety worse  . Rexulti [Brexpiprazole] Anxiety and Other (See Comments)    Made anxiety worse  . Trintellix [Vortioxetine] Anxiety and Other (See Comments)    Made anxiety worse    Labs:  Results for orders placed or performed during the hospital encounter of 11/20/19 (from the  past 48 hour(s))  Comprehensive metabolic panel     Status: Abnormal   Collection Time: 11/20/19  6:50 PM  Result Value Ref Range   Sodium 136 135 - 145 mmol/L   Potassium 3.8 3.5 - 5.1 mmol/L   Chloride 100 98 - 111 mmol/L   CO2 24 22 - 32 mmol/L   Glucose, Bld 136 (H) 70 - 99 mg/dL    Comment: Glucose reference range applies only to samples taken after fasting for at least 8 hours.   BUN 14 8 - 23 mg/dL   Creatinine, Ser 0.57 0.44 - 1.00 mg/dL   Calcium 9.9 8.9 - 10.3 mg/dL   Total Protein 7.4 6.5 - 8.1 g/dL   Albumin 4.1 3.5 - 5.0 g/dL   AST 24 15 - 41 U/L   ALT 20 0 - 44 U/L  Alkaline Phosphatase 89 38 - 126 U/L   Total Bilirubin 1.1 0.3 - 1.2 mg/dL   GFR calc non Af Amer >60 >60 mL/min   GFR calc Af Amer >60 >60 mL/min   Anion gap 12 5 - 15    Comment: Performed at [facility], [place] [address], [place], [place] [address]  Ethanol     Status: None   Collection Time: 11/20/19  6:50 PM  Result Value Ref Range   Alcohol, Ethyl (B) <10 <10 mg/dL    Comment: (NOTE) Lowest detectable limit for serum alcohol is 10 mg/dL. For medical purposes only. Performed at [facility], [place] [address], [place], [place] [address]   CBC with Diff     Status: None   Collection Time: 11/20/19  6:50 PM  Result Value Ref Range   WBC 8.6 4.0 - 10.5 K/uL   RBC 4.81 3.87 - 5.11 MIL/uL   Hemoglobin 14.2 12.0 - 15.0 g/dL   HCT 43.4 36.0 - 46.0 %   MCV 90.2 80.0 - 100.0 fL   MCH 29.5 26.0 - 34.0 pg   MCHC 32.7 30.0 - 36.0 g/dL   RDW 14.8 11.5 - 15.5 %   Platelets 268 150 - 400 K/uL   nRBC 0.0 0.0 - 0.2 %   Neutrophils Relative % 69 %   Neutro Abs 5.9 1.7 - 7.7 K/uL   Lymphocytes Relative 22 %   Lymphs Abs 1.8 0.7 - 4.0 K/uL   Monocytes Relative 6 %   Monocytes Absolute 0.5 0.1 - 1.0 K/uL   Eosinophils Relative 2 %   Eosinophils Absolute 0.2 0.0 - 0.5 K/uL   Basophils Relative 1 %   Basophils Absolute 0.0 0.0 - 0.1 K/uL   Immature Granulocytes 0 %    Abs Immature Granulocytes 0.03 0.00 - 0.07 K/uL    Comment: Performed at [facility], [place] [address], [place], [place] [address]  Respiratory Panel by RT PCR (Flu A&B, Covid) - Nasopharyngeal Swab     Status: None   Collection Time: 11/20/19 10:39 PM   Specimen: Nasopharyngeal Swab  Result Value Ref Range   SARS Coronavirus 2 by RT PCR NEGATIVE NEGATIVE    Comment: (NOTE) SARS-CoV-2 target nucleic acids are NOT DETECTED. The SARS-CoV-2 RNA is generally detectable in upper respiratoy specimens during the acute phase of infection. The lowest concentration of SARS-CoV-2 viral copies this assay can detect is 131 copies/mL. A negative result does not preclude SARS-Cov-2 infection and should not be used as the sole basis for treatment or other patient management decisions. A negative result may occur with  improper specimen collection/handling, submission of specimen other than nasopharyngeal swab, presence of viral mutation(s) within the areas targeted by this assay, and inadequate number of viral copies (<131 copies/mL). A negative result must be combined with clinical observations, patient history, and epidemiological information. The expected result is Negative. Fact Sheet for Patients:  PinkCheek.be Fact Sheet for Healthcare Providers:  GravelBags.it This test is not yet ap proved or cleared by the [place] FDA and  has been authorized for detection and/or diagnosis of SARS-CoV-2 by FDA under an Emergency Use Authorization (EUA). This EUA will remain  in effect (meaning this test can be used) for the duration of the COVID-19 declaration under Section 564(b)(1) of the Act, 21 U.S.C. section 360bbb-3(b)(1), unless the authorization is terminated or revoked sooner.    Influenza A by PCR NEGATIVE NEGATIVE   Influenza B by PCR NEGATIVE  NEGATIVE    Comment: (NOTE) The Xpert Xpress SARS-CoV-2/FLU/RSV assay  is intended as an aid in  the diagnosis of influenza from Nasopharyngeal swab specimens and  should not be used as a sole basis for treatment. Nasal washings and  aspirates are unacceptable for Xpert Xpress SARS-CoV-2/FLU/RSV  testing. Fact Sheet for Patients: PinkCheek.be Fact Sheet for Healthcare Providers: GravelBags.it This test is not yet approved or cleared by the [place] FDA and  has been authorized for detection and/or diagnosis of SARS-CoV-2 by  FDA under an Emergency Use Authorization (EUA). This EUA will remain  in effect (meaning this test can be used) for the duration of the  Covid-19 declaration under Section 564(b)(1) of the Act, 21  U.S.C. section 360bbb-3(b)(1), unless the authorization is  terminated or revoked. Performed at [facility], [place] [address], [place], [place] [address]   Urine rapid drug screen (hosp performed)     Status: Abnormal   Collection Time: 11/20/19 10:39 PM  Result Value Ref Range   Opiates NONE DETECTED NONE DETECTED   Cocaine NONE DETECTED NONE DETECTED   Benzodiazepines POSITIVE (A) NONE DETECTED   Amphetamines NONE DETECTED NONE DETECTED   Tetrahydrocannabinol NONE DETECTED NONE DETECTED   Barbiturates NONE DETECTED NONE DETECTED    Comment: (NOTE) DRUG SCREEN FOR MEDICAL PURPOSES ONLY.  IF CONFIRMATION IS NEEDED FOR ANY PURPOSE, NOTIFY LAB WITHIN 5 DAYS. LOWEST DETECTABLE LIMITS FOR URINE DRUG SCREEN Drug Class                     Cutoff (ng/mL) Amphetamine and metabolites    1000 Barbiturate and metabolites    200 Benzodiazepine                 756 Tricyclics and metabolites     300 Opiates and metabolites        300 Cocaine and metabolites        300 THC                            50 Performed at [facility], [place] [address], [place], [place] [address]   Urinalysis, Routine w reflex microscopic     Status: Abnormal    Collection Time: 11/20/19 10:39 PM  Result Value Ref Range   Color, Urine AMBER (A) YELLOW    Comment: BIOCHEMICALS MAY BE AFFECTED BY COLOR   APPearance TURBID (A) CLEAR   Specific Gravity, Urine 1.019 1.005 - 1.030   pH 5.0 5.0 - 8.0   Glucose, UA NEGATIVE NEGATIVE mg/dL   Hgb urine dipstick LARGE (A) NEGATIVE   Bilirubin Urine SMALL (A) NEGATIVE   Ketones, ur 20 (A) NEGATIVE mg/dL   Protein, ur 100 (A) NEGATIVE mg/dL   Nitrite NEGATIVE NEGATIVE   Leukocytes,Ua LARGE (A) NEGATIVE   RBC / HPF >50 (H) 0 - 5 RBC/hpf   WBC, UA >50 (H) 0 - 5 WBC/hpf   Bacteria, UA MANY (A) NONE SEEN   WBC Clumps PRESENT    Mucus PRESENT    Ca Oxalate Crys, UA PRESENT     Comment: Performed at [facility], [place] [address], [place], [place] [address]  Urine culture     Status: Abnormal   Collection Time: 11/20/19 10:39 PM   Specimen: Urine, Random  Result Value Ref Range   Specimen Description      URINE, RANDOM Performed at [place]  [address] [place], [place] [address]    Special Requests      NONE Performed at [facility], [place] [address] [place], [place] [address]    Culture MULTIPLE ORGANISMS PRESENT, NONE PREDOMINANT (A)    Report Status 11/22/2019 FINAL   POC CBG, ED     Status: Abnormal   Collection Time: 11/21/19  9:38 AM  Result Value Ref Range   Glucose-Capillary 109 (H) 70 - 99 mg/dL    Comment: Glucose reference range applies only to samples taken after fasting for at least 8 hours.  Hemoglobin A1c     Status: Abnormal   Collection Time: 11/21/19  5:58 PM  Result Value Ref Range   Hgb A1c MFr Bld 6.4 (H) 4.8 - 5.6 %    Comment: (NOTE) Pre diabetes:          5.7%-6.4% Diabetes:              >6.4% Glycemic control for   <7.0% adults with diabetes    Mean Plasma Glucose 136.98 mg/dL    Comment: Performed at [place] [address] [place], [place] [address]  TSH     Status: None   Collection Time: 11/21/19   5:58 PM  Result Value Ref Range   TSH 1.629 0.350 - 4.500 uIU/mL    Comment: Performed by a 3rd Generation assay with a functional sensitivity of <=0.01 uIU/mL. Performed at [facility], [place] [address] [place], [place] [address]   Glucose, capillary     Status: None   Collection Time: 11/21/19  9:29 PM  Result Value Ref Range   Glucose-Capillary 97 70 - 99 mg/dL    Comment: Glucose reference range applies only to samples taken after fasting for at least 8 hours.  Basic metabolic panel     Status: None   Collection Time: 11/22/19  5:10 AM  Result Value Ref Range   Sodium 137 135 - 145 mmol/L   Potassium 3.9 3.5 - 5.1 mmol/L   Chloride 104 98 - 111 mmol/L   CO2 22 22 - 32 mmol/L   Glucose, Bld 99 70 - 99 mg/dL    Comment: Glucose reference range applies only to samples taken after fasting for at least 8 hours.   BUN 18 8 - 23 mg/dL   Creatinine, Ser 0.44 0.44 - 1.00 mg/dL   Calcium 9.3 8.9 - 10.3 mg/dL   GFR calc non Af Amer >60 >60 mL/min   GFR calc Af Amer >60 >60 mL/min   Anion gap 11 5 - 15    Comment: Performed at [facility], [place] [address], [place], [place] [address]  CBC     Status: None   Collection Time: 11/22/19  5:10 AM  Result Value Ref Range   WBC 8.5 4.0 - 10.5 K/uL   RBC 4.47 3.87 - 5.11 MIL/uL   Hemoglobin 13.3 12.0 - 15.0 g/dL   HCT 40.5 36.0 - 46.0 %   MCV 90.6 80.0 - 100.0 fL   MCH 29.8 26.0 - 34.0 pg   MCHC 32.8 30.0 - 36.0 g/dL   RDW 15.1 11.5 - 15.5 %   Platelets 229 150 - 400 K/uL   nRBC 0.0 0.0 - 0.2 %    Comment: Performed at [facility], [place] [address], [place], [place] [address]  Glucose, capillary     Status: None   Collection Time: 11/22/19  7:39 AM  Result Value Ref Range  Glucose-Capillary 98 70 - 99 mg/dL    Comment: Glucose reference range applies only to samples taken after fasting for at least 8 hours.   Comment 1 Notify RN    Comment 2 Document in Chart   Glucose, capillary      Status: None   Collection Time: 11/22/19 11:52 AM  Result Value Ref Range   Glucose-Capillary 85 70 - 99 mg/dL    Comment: Glucose reference range applies only to samples taken after fasting for at least 8 hours.   Comment 1 Notify RN    Comment 2 Document in Chart     Medications:  Current Facility-Administered Medications  Medication Dose Route Frequency Provider Last Rate Last Admin  . 0.9 %  sodium chloride infusion   Intravenous Continuous Dahal, Marlowe Aschoff, MD 75 mL/hr at 11/21/19 1821 New Bag at 11/21/19 1821  . acetaminophen (TYLENOL) tablet 650 mg  650 mg Oral Q6H PRN Dahal, Marlowe Aschoff, MD       Or  . acetaminophen (TYLENOL) suppository 650 mg  650 mg Rectal Q6H PRN Dahal, Marlowe Aschoff, MD      . aspirin EC tablet 81 mg  81 mg Oral Daily Dorie Rank, MD      . atorvastatin (LIPITOR) tablet 40 mg  40 mg Oral QHS Dorie Rank, MD      . cefTRIAXone (ROCEPHIN) 1 g in sodium chloride 0.9 % 100 mL IVPB  1 g Intravenous Once Nanavati, Ankit, MD      . cefTRIAXone (ROCEPHIN) 1 g in sodium chloride 0.9 % 100 mL IVPB  1 g Intravenous Q24H Dahal, Marlowe Aschoff, MD 200 mL/hr at 11/22/19 0546 1 g at 11/22/19 0546  . Chlorhexidine Gluconate Cloth 2 % PADS 6 each  6 each Topical Daily Georgette Shell, MD      . diphenhydrAMINE (BENADRYL) injection 25 mg  25 mg Intravenous Once Kathrynn [place], Ankit, MD      . divalproex (DEPAKOTE SPRINKLE) capsule 125 mg  125 mg Oral BID Dorie Rank, MD      . docusate sodium (COLACE) capsule 100 mg  100 mg Oral BID Dorie Rank, MD      . enoxaparin (LOVENOX) injection 40 mg  40 mg Subcutaneous Q24H Dahal, Binaya, MD      . feeding supplement (ENSURE ENLIVE) (ENSURE ENLIVE) liquid 120 mL  120 mL Oral BID Dorie Rank, MD      . feeding supplement (PRO-STAT SUGAR FREE 64) liquid 30 mL  30 mL Oral BID Dahal, Marlowe Aschoff, MD      . hydrOXYzine (ATARAX/VISTARIL) tablet 10 mg  10 mg Oral TID PRN Dorie Rank, MD      . insulin aspart (novoLOG) injection 0-5 Units  0-5 Units Subcutaneous QHS  Dahal, Binaya, MD      . insulin aspart (novoLOG) injection 0-9 Units  0-9 Units Subcutaneous TID WC Dahal, Binaya, MD      . irbesartan (AVAPRO) tablet 300 mg  300 mg Oral Daily Dorie Rank, MD      . LORazepam (ATIVAN) injection 1 mg  1 mg Intravenous Once Nanavati, Ankit, MD      . magnesium hydroxide (MILK OF MAGNESIA) suspension 30 mL  30 mL Oral Daily PRN Dahal, Binaya, MD      . ondansetron (ZOFRAN) tablet 4 mg  4 mg Oral Q6H PRN Dahal, Binaya, MD       Or  . ondansetron (ZOFRAN) injection 4 mg  4 mg Intravenous Q6H PRN Terrilee Croak, MD      .  pantoprazole (PROTONIX) EC tablet 40 mg  40 mg Oral BID Dorie Rank, MD      . risperiDONE (RISPERDAL) tablet 0.5 mg  0.5 mg Oral QHS Dorie Rank, MD      . senna [facility]) tablet 8.6 mg  1 tablet Oral BID Dahal, Marlowe Aschoff, MD      . sertraline (ZOLOFT) tablet 25 mg  25 mg Oral Daily Dorie Rank, MD      . sodium chloride flush (NS) 0.9 % injection 3 mL  3 mL Intravenous Q12H Terrilee Croak, MD   3 mL at 11/21/19 2258  . sodium phosphate (FLEET) 7-19 GM/118ML enema 1 enema  1 enema Rectal Once PRN Dahal, Binaya, MD      . sorbitol 70 % solution 30 mL  30 mL Oral Daily PRN Dahal, Marlowe Aschoff, MD        Musculoskeletal: Strength & Muscle Tone: Unable to assess Gait & Station: Unable to assess Patient leans: Unable to assess  Psychiatric Specialty Exam: Physical Exam  Nursing note and vitals reviewed. Constitutional: She appears well-developed.  HENT:  Head: Normocephalic.  Cardiovascular: Normal rate.  Respiratory: Effort normal.  Neurological: She is alert.  Psychiatric: Thought content normal. Her speech is slurred. She is withdrawn.    Review of Systems  Constitutional: Negative.   HENT: Negative.   Eyes: Negative.   Respiratory: Negative.   Cardiovascular: Negative.   Gastrointestinal: Negative.   Genitourinary: Negative.   Musculoskeletal: Negative.   Skin: Negative.   Neurological: Negative.     Blood pressure 129/79, pulse 82,  temperature 98.6 F (37 C), temperature source Oral, resp. rate 16, height 5' 4.02" (1.626 m), weight 77.4 kg, SpO2 99 %.Body mass index is 29.28 kg/m.  General Appearance: Disheveled  Eye Contact:  Minimal  Speech:  Slow  Volume:  Decreased  Mood:  Depressed  Affect:  Depressed  Thought Process: Unable to assess  Orientation:  Other:  Unable to assess  Thought Content:  Unable to assess  Suicidal Thoughts:  No  Homicidal Thoughts:  No  Memory:  Immediate;   Fair  Judgement:  Impaired  Insight:  Lacking  Psychomotor Activity:  Normal  Concentration:  Attention Span: Unable to assess  Recall:  Unable to assess  Fund of Knowledge:  Unable to assess  Language:  Fair  Akathisia:  No  Handed:  Right  AIMS (if indicated):     Assets:  Communication Skills Desire for Improvement Financial Resources/Insurance Housing Intimacy Leisure Time Resilience Social Support  ADL's:  Impaired  Cognition:  Impaired,  Mild  Sleep:        Treatment Plan Summary: Daily contact with patient to assess and evaluate symptoms and progress in treatment  Case discussed with Dr. Mallie Darting. Patient known to the service.  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  Patient with history of severe anxiety disorder.  Patient has history of multiple failed antidepressants with antipsychotic augmentation strategies as well as ECT. Recommend consider continue home medications including Risperdal and Zoloft, consider additional Seroquel 50 mg nightly augmentative therapy.  Disposition: No evidence of imminent risk to self or others at present.   Patient does not meet criteria for psychiatric inpatient admission.  This service was provided via telemedicine using a 2-way, interactive audio and video technology.  Names of all persons participating in this telemedicine service and their role in this encounter. Name: Jeanette Rose Role: Patient   Name: Letitia Libra Role: FNP  Emmaline Kluver, FNP 11/22/2019 2:42 PM

## 2019-11-22 NOTE — Progress Notes (Signed)
PT Cancellation Note  Patient Details Name: Jeanette Rose MRN: [id number] DOB: 11/11/1955   Cancelled Treatment:    Reason Eval/Treat Not Completed: Patient declined, no reason specified(Patient resting in bed, awake and alert. Pt stating "no" "no" "no" repeatedly after PT introduced self. Pt stating "no" to multiple questions, even when asked if she could tell therapist her name. Pt stated "go away" but did state "yes" when asked if she wanted the door kept open. Will follow up with patient at later date/time as schedule allows.)   Verner Mould, DPT Physical Therapist with [facility] [phone]  11/22/2019 11:38 AM

## 2019-11-22 NOTE — Progress Notes (Signed)
PROGRESS NOTE    Jeanette Rose  LGX:[id number] DOB: [birth year] DOA: 11/20/2019 PCP: Jeanette Rose  Brief Narrative: 64 y.o. female with PMH significant for HTN, T2DM, OSA, stroke/TIA, hypothyroidism, GERD, fibromyalgia, anxiety, depression.   Patient was sent to the ED from SNF for poor oral intake, refusing food, drink and medication. Patient is a poor historian and answers only 'I don't know' to most of the questions.  I called and discussed with patient's sister [name] who is next of kin, patient does not have any designated medical power of attorney. For last few months, patient's depression has been getting worse.  She was living alone and was hospitalized couple of times for behavioral symptoms.  She also required ECT in the past for depression with psychosis.  Last month, she was found at home, in her couch with urinary incontinence, apparently catatonic, not up from couch for 5 days. She was hospitalized 1/16-1/21, treated for UTI.  Her medicines were adjusted and she was discharged to [facility] for long-term placement.  Patient sister recalls that the psychiatrist at the place started on Seroquel after which she had some noticeable improvements.  For some reason, she was later switched to Risperdal. For last several days, patient has been complaining that she cannot swallow anything because of throat closing sensation.  Providers at the facility examined her and did not find anything wrong with the throat.  Patient however would not agree with assessment and continued to refuse any intake.  She also had a urinary retention and hence a Foley catheter was placed few days ago.  Patient was sent to the ED on 2/23 for further evaluation and management.   In the ED, patient has been afebrile, hemodynamically stable.  Breathing comfortably on room air. CBC and BMP essentially normal Urinalysis showed turbid amber-colored urine with large amount hemoglobin large amount of  leukocytes, 100 protein, many bacteria and more than 50 WBC.  Patient was seen by behavioral health counselor in the ED.  Patient did not have suicidal or homicidal ideation and hence she was not admitted to behavioral health inpatient unit.  Follow-up with psychiatry as an outpatient was recommended.  I do not see any adjustment in medication done either.  Hospitalist service was called this afternoon for inpatient admission and management.  Assessment & Plan:   Active Problems:   UTI (urinary tract infection)   #1 UTI with acute urinary retention-UA appeared dirty so urine culture was sent which came back as multiple organisms none predominant.  Urine culture from last month showed more than 100,000 colonies of lactobacillus. Had Foley catheter placed at the facility few days prior to admission to the hospital for urinary retention. I will continue Rocephin for 3 days and then.  #2 type 2 diabetes on Metformin  hemoglobin A1c is 6.4 CBG (last 3)  Recent Labs    11/21/19 2129 11/22/19 0739 11/22/19 1152  GLUCAP 97 98 85   Will dc metformin since she is refusing food  #3 major depression with psychosis/anxiety and fibromyalgia-she continues to refuse to eat or drink.  She was seen by psych in the ED and deemed not a candidate for inpatient psych treatment.  Continue home medications if she does take them Depakote hydroxyzine Zoloft and Respirdal  #4 essential hypertension on irbesartan  #5 history of hyperlipidemia continue statin  #6 history of stroke and TIA continue aspirin and statin  #7 history of obstructive sleep apnea patient noncompliant to CPAP  #8 history  of hypothyroidism but not on any medications TSH normal    Pressure Injury 11/21/19 Buttocks Right;Left Stage 3 -  Full thickness tissue loss. Subcutaneous fat may be visible but bone, tendon or muscle are NOT exposed. (Active)  11/21/19 1630  Location: Buttocks  Location Orientation: Right;Left  Staging:  Stage 3 -  Full thickness tissue loss. Subcutaneous fat may be visible but bone, tendon or muscle are NOT exposed.  Wound Description (Comments):   Present on Admission: Yes     Pressure Injury Heel Left (Active)     Location: Heel  Location Orientation: Left  Staging:   Wound Description (Comments):   Present on Admission:     Estimated body mass index is 29.28 kg/m as calculated from the following:   Height as of this encounter: 5' 4.02" (1.626 m).   Weight as of this encounter: 77.4 kg.  DVT prophylaxis: lovenox Code Status:rocephin Family Communication:dw sister Disposition Plan: Patient came from home  Place of discharge unknown Patient admitted with refusal to eat and drink and continues to do the same with UTI. Psychiatry consult pending Consultants:  Psychiatry  Procedures: None Antimicrobials: Rocephin  Subjective: Laying in bed Sais no to everything She was very cold to touch She was awake but does not speak anything more than nodding her head no  Objective: Vitals:   11/21/19 1630 11/21/19 1630 11/21/19 2130 11/22/19 0516  BP:  116/84 123/89 119/80  Pulse:  80 94 81  Resp:  18 16 14   Temp:  98.3 F (36.8 C) 98.8 F (37.1 C) 97.8 F (36.6 C)  TempSrc:  Oral Oral Oral  SpO2:   98% 96%  Weight: 77.4 kg     Height: 5' 4.02" (1.626 m)       Intake/Output Summary (Last 24 hours) at 11/22/2019 1315 Last data filed at 11/22/2019 0916 Gross per 24 hour  Intake 0 ml  Output 775 ml  Net -775 ml   Filed Weights   11/21/19 1630  Weight: 77.4 kg    Examination:  General exam: Appears calm and comfortable  Respiratory system: Clear to auscultation. Respiratory effort normal. Cardiovascular system: S1 & S2 heard, RRR. No JVD, murmurs, rubs, gallops or clicks. No pedal edema. Gastrointestinal system: Abdomen is nondistended, soft and nontender. No organomegaly or masses felt. Normal bowel sounds heard. Central nervous system: Alert and oriented. No focal  neurological deficits. Extremities: Symmetric 5 x 5 power. Skin: No rashes, lesions or ulcers Psychiatry: Unable to assess  Data Reviewed: I have personally reviewed following labs and imaging studies  CBC: Recent Labs  Lab 11/20/19 1850 11/22/19 0510  WBC 8.6 8.5  NEUTROABS 5.9  --   HGB 14.2 13.3  HCT 43.4 40.5  MCV 90.2 90.6  PLT 268 630   Basic Metabolic Panel: Recent Labs  Lab 11/20/19 1850 11/22/19 0510  NA 136 137  K 3.8 3.9  CL 100 104  CO2 24 22  GLUCOSE 136* 99  BUN 14 18  CREATININE 0.57 0.44  CALCIUM 9.9 9.3   GFR: Estimated Creatinine Clearance: 73.4 mL/min (by C-G formula based on SCr of 0.44 mg/dL). Liver Function Tests: Recent Labs  Lab 11/20/19 1850  AST 24  ALT 20  ALKPHOS 89  BILITOT 1.1  PROT 7.4  ALBUMIN 4.1   No results for input(s): LIPASE, AMYLASE in the last 168 hours. No results for input(s): AMMONIA in the last 168 hours. Coagulation Profile: No results for input(s): INR, PROTIME in the last 168 hours. Cardiac  Enzymes: No results for input(s): CKTOTAL, CKMB, CKMBINDEX, TROPONINI in the last 168 hours. BNP (last 3 results) No results for input(s): PROBNP in the last 8760 hours. HbA1C: Recent Labs    11/21/19 1758  HGBA1C 6.4*   CBG: Recent Labs  Lab 11/21/19 0938 11/21/19 2129 11/22/19 0739 11/22/19 1152  GLUCAP 109* 97 98 85   Lipid Profile: No results for input(s): CHOL, HDL, LDLCALC, TRIG, CHOLHDL, LDLDIRECT in the last 72 hours. Thyroid Function Tests: Recent Labs    11/21/19 1758  TSH 1.629   Anemia Panel: No results for input(s): VITAMINB12, FOLATE, FERRITIN, TIBC, IRON, RETICCTPCT in the last 72 hours. Sepsis Labs: No results for input(s): PROCALCITON, LATICACIDVEN in the last 168 hours.  Recent Results (from the past 240 hour(s))  Respiratory Panel by RT PCR (Flu A&B, Covid) - Nasopharyngeal Swab     Status: None   Collection Time: 11/20/19 10:39 PM   Specimen: Nasopharyngeal Swab  Result Value Ref  Range Status   SARS Coronavirus 2 by RT PCR NEGATIVE NEGATIVE Final    Comment: (NOTE) SARS-CoV-2 target nucleic acids are NOT DETECTED. The SARS-CoV-2 RNA is generally detectable in upper respiratoy specimens during the acute phase of infection. The lowest concentration of SARS-CoV-2 viral copies this assay can detect is 131 copies/mL. A negative result does not preclude SARS-Cov-2 infection and should not be used as the sole basis for treatment or other patient management decisions. A negative result may occur with  improper specimen collection/handling, submission of specimen other than nasopharyngeal swab, presence of viral mutation(s) within the areas targeted by this assay, and inadequate number of viral copies (<131 copies/mL). A negative result must be combined with clinical observations, patient history, and epidemiological information. The expected result is Negative. Fact Sheet for Patients:  PinkCheek.be Fact Sheet for Healthcare Providers:  GravelBags.it This test is not yet ap proved or cleared by the [place] FDA and  has been authorized for detection and/or diagnosis of SARS-CoV-2 by FDA under an Emergency Use Authorization (EUA). This EUA will remain  in effect (meaning this test can be used) for the duration of the COVID-19 declaration under Section 564(b)(1) of the Act, 21 U.S.C. section 360bbb-3(b)(1), unless the authorization is terminated or revoked sooner.    Influenza A by PCR NEGATIVE NEGATIVE Final   Influenza B by PCR NEGATIVE NEGATIVE Final    Comment: (NOTE) The Xpert Xpress SARS-CoV-2/FLU/RSV assay is intended as an aid in  the diagnosis of influenza from Nasopharyngeal swab specimens and  should not be used as a sole basis for treatment. Nasal washings and  aspirates are unacceptable for Xpert Xpress SARS-CoV-2/FLU/RSV  testing. Fact Sheet for  Patients: PinkCheek.be Fact Sheet for Healthcare Providers: GravelBags.it This test is not yet approved or cleared by the [place] FDA and  has been authorized for detection and/or diagnosis of SARS-CoV-2 by  FDA under an Emergency Use Authorization (EUA). This EUA will remain  in effect (meaning this test can be used) for the duration of the  Covid-19 declaration under Section 564(b)(1) of the Act, 21  U.S.C. section 360bbb-3(b)(1), unless the authorization is  terminated or revoked. Performed at [facility], [place] [address], [place], [place] [address]   Urine culture     Status: Abnormal   Collection Time: 11/20/19 10:39 PM   Specimen: Urine, Random  Result Value Ref Range Status   Specimen Description   Final    URINE, RANDOM Performed at [facility], [place]  [address] [place], [place] [address]    Special Requests   Final    NONE Performed at [facility], [place] [address] [place], [place] [address]    Culture MULTIPLE ORGANISMS PRESENT, NONE PREDOMINANT (A)  Final   Report Status 11/22/2019 FINAL  Final         Radiology Studies: No results found.      Scheduled Meds: . aspirin EC  81 mg Oral Daily  . atorvastatin  40 mg Oral QHS  . Chlorhexidine Gluconate Cloth  6 each Topical Daily  . diphenhydrAMINE  25 mg Intravenous Once  . divalproex  125 mg Oral BID  . docusate sodium  100 mg Oral BID  . enoxaparin (LOVENOX) injection  40 mg Subcutaneous Q24H  . feeding supplement (ENSURE ENLIVE)  120 mL Oral BID  . feeding supplement (PRO-STAT SUGAR FREE 64)  30 mL Oral BID  . insulin aspart  0-5 Units Subcutaneous QHS  . insulin aspart  0-9 Units Subcutaneous TID WC  . irbesartan  300 mg Oral Daily  . LORazepam  1 mg Intravenous Once  . metFORMIN  1,000 mg Oral Q breakfast  . pantoprazole  40 mg Oral BID  . risperiDONE  0.5 mg Oral QHS  . senna  1 tablet Oral  BID  . sertraline  25 mg Oral Daily  . sodium chloride flush  3 mL Intravenous Q12H   Continuous Infusions: . sodium chloride 75 mL/hr at 11/21/19 1821  . cefTRIAXone (ROCEPHIN)  IV    . cefTRIAXone (ROCEPHIN)  IV 1 g (11/22/19 0546)     LOS: 1 day     Georgette Shell, MD 11/22/2019, 1:15 PM

## 2019-11-23 DIAGNOSIS — L8990 Pressure ulcer of unspecified site, unspecified stage: Secondary | ICD-10-CM | POA: Insufficient documentation

## 2019-11-23 LAB — CBC
HCT: 38.6 % (ref 36.0–46.0)
Hemoglobin: 12 g/dL (ref 12.0–15.0)
MCH: 29.3 pg (ref 26.0–34.0)
MCHC: 31.1 g/dL (ref 30.0–36.0)
MCV: 94.1 fL (ref 80.0–100.0)
Platelets: 207 10*3/uL (ref 150–400)
RBC: 4.1 MIL/uL (ref 3.87–5.11)
RDW: 14.9 % (ref 11.5–15.5)
WBC: 7.8 10*3/uL (ref 4.0–10.5)
nRBC: 0 % (ref 0.0–0.2)

## 2019-11-23 LAB — GLUCOSE, CAPILLARY
Glucose-Capillary: 95 mg/dL (ref 70–99)
Glucose-Capillary: 157 mg/dL — ABNORMAL HIGH (ref 70–99)
Glucose-Capillary: 202 mg/dL — ABNORMAL HIGH (ref 70–99)
Glucose-Capillary: 125 mg/dL — ABNORMAL HIGH (ref 70–99)

## 2019-11-23 LAB — BASIC METABOLIC PANEL
Anion gap: 10 (ref 5–15)
BUN: 24 mg/dL — ABNORMAL HIGH (ref 8–23)
CO2: 21 mmol/L — ABNORMAL LOW (ref 22–32)
Calcium: 9.1 mg/dL (ref 8.9–10.3)
Chloride: 108 mmol/L (ref 98–111)
Creatinine, Ser: 0.46 mg/dL (ref 0.44–1.00)
GFR calc Af Amer: >60 mL/min (ref >60)
GFR calc non Af Amer: >60 mL/min (ref >60)
Glucose, Bld: 102 mg/dL — ABNORMAL HIGH (ref 70–99)
Potassium: 3.7 mmol/L (ref 3.5–5.1)
Sodium: 139 mmol/L (ref 135–145)

## 2019-11-23 LAB — SARS CORONAVIRUS 2 (TAT 6-24 HRS): SARS Coronavirus 2: NEGATIVE

## 2019-11-23 NOTE — Progress Notes (Signed)
PROGRESS NOTE    Jeanette Rose  SPQ:[id number] DOB: 1956-08-16 DOA: 11/20/2019 PCP: Salvatore Marvel, PA-C   Brief Narrative:63 y.o.femalewith PMH significant for HTN, T2DM, OSA, stroke/TIA, hypothyroidism, GERD, fibromyalgia, anxiety, depression. Patient was[facility]to the ED from SNF for poor oral intake, refusing food, drink and medication. Patient is a poor historian and answers only'I don't know'to most of the questions. I called and discussed with patient's sister [name] who is next of kin,patient does not have any designated medical power of attorney. For last few months, patient's depression has been getting worse. She was living alone and was hospitalized couple of times for behavioral symptoms. She also required ECT in the past for depression with psychosis. Last month, she was found at home, in her couch with urinary incontinence, apparently catatonic, not up from couch for 5 days. She washospitalized 1/16-1/21,treated for UTI. Her medicines were adjusted and she was discharged to [facility] for long-term placement.  Patient sister recalls that the psychiatrist at the place started on Seroquel after which she had some noticeable improvements. For some reason, she was later switched to Risperdal. For last several days, patient has been complaining that she cannot swallow anything because of throat closing sensation. Providers at the facility examined her and did not find anything wrong with the throat. Patient however would not agree with assessment and continued to refuse any intake. She also had a urinary retention and hence a Foley catheter was placed few days ago.  Patient was sent to the ED on 2/23 for further evaluation and management.  In the ED, patient has been afebrile, hemodynamically stable. Breathing comfortably on room air. CBC and BMP essentially normal Urinalysis showed turbid amber-colored urine with large amount hemoglobin large amount of  leukocytes, 100 protein, many bacteria and more than 50 WBC.  Patient was seen by behavioral health counselor in the ED. Patient did not have suicidal or homicidal ideation and hence she was not admitted to behavioral health inpatient unit. Follow-up with psychiatry as an outpatient was recommended. I do not see any adjustment in medication done either.  Hospitalist service was called this afternoon for inpatient admission and management.  Assessment & Plan:   Active Problems:   Depression   UTI (urinary tract infection)   Dysphagia   Pressure injury of skin  #1 UTI with acute urinary retention-UA appeared dirty so urine culture was sent which came back as multiple organisms none predominant.  Urine culture from last month showed more than 100,000 colonies of lactobacillus. Had Foley catheter placed at the facility few days prior to admission to the hospital for urinary retention. I will continue Rocephin for 3 days and then dc   #2 type 2 diabetes on Metformin  hemoglobin A1c is 6.4 CBG (last 3)  Recent Labs (last 2 labs)        Recent Labs    11/21/19 2129 11/22/19 0739 11/22/19 1152  GLUCAP 97 98 85     Will dc metformin since she is refusing food  #3 major depression with psychosis/anxiety and fibromyalgia-she continues to refuse to eat or drink.  She was seen by psych in the ED and deemed not a candidate for inpatient psych treatment.  Continue home medications if she does take them Depakote hydroxyzine Zoloft and Respirdal  #4 essential hypertension on irbesartan  #5 history of hyperlipidemia continue statin  #6 history of stroke and TIA continue aspirin and statin  #7 history of obstructive sleep apnea patient noncompliant to CPAP  #  8 history of hypothyroidism but not on any medications TSH normal    Pressure Injury 11/21/19 Buttocks Right;Left Stage 3 -  Full thickness tissue loss. Subcutaneous fat may be visible but bone, tendon or muscle are NOT  exposed. (Active)  11/21/19 1630  Location: Buttocks  Location Orientation: Right;Left  Staging: Stage 3 -  Full thickness tissue loss. Subcutaneous fat may be visible but bone, tendon or muscle are NOT exposed.  Wound Description (Comments):   Present on Admission: Yes     Pressure Injury Heel Left (Active)     Location: Heel  Location Orientation: Left  Staging:   Wound Description (Comments):   Present on Admission:      Pressure Injury 11/21/19 Sacrum Mid;Posterior Stage 3 -  Full thickness tissue loss. Subcutaneous fat may be visible but bone, tendon or muscle are NOT exposed. (Active)  11/21/19 1630  Location: Sacrum  Location Orientation: Mid;Posterior  Staging: Stage 3 -  Full thickness tissue loss. Subcutaneous fat may be visible but bone, tendon or muscle are NOT exposed.  Wound Description (Comments):   Present on Admission: Yes      Nutrition Problem: Inadequate oral intake Etiology: lethargy/confusion(acute psychosis)     Signs/Symptoms: per patient/family report(per notes, sister of pt reports no po intake in 5 days;  pt noted refusing meals and po medications during admission)    Interventions: Magic cup, Refer to RD note for recommendations  Estimated body mass index is 29.28 kg/m as calculated from the following:   Height as of this encounter: 5' 4.02" (1.626 m).   Weight as of this encounter: 77.4 kg.  DVT prophylaxis: lovenox Code Status:rocephin Family Communication:dw sister Disposition Plan: Patient came from home  Place of discharge discharge her back to the facility.  Patient admitted with refusal to eat and drink and continues to do the same with UTI. If she remains stable overnight will plan discharge tomorrow to the facility. Consultants:  Psychiatry  Procedures: None Antimicrobials: Rocephin   Subjective:  Per staffing she had Ensure today and she ate something better than yesterday Objective: Vitals:   11/22/19 1357 11/22/19  2127 11/23/19 0540 11/23/19 1352  BP: 129/79 124/77 104/66 132/69  Pulse: 82 75 80 87  Resp: 16 17 16 18   Temp: 98.6 F (37 C) (!) 97.5 F (36.4 C) 97.7 F (36.5 C) (!) 97.5 F (36.4 C)  TempSrc: Oral  Oral Oral  SpO2: 99% 98% 95% 100%  Weight:      Height:        Intake/Output Summary (Last 24 hours) at 11/23/2019 1527 Last data filed at 11/23/2019 1300 Gross per 24 hour  Intake 2143.86 ml  Output 235 ml  Net 1908.86 ml   Filed Weights   11/21/19 1630  Weight: 77.4 kg    Examination:  General exam: Appears calm and comfortable  Respiratory system: Clear to auscultation. Respiratory effort normal. Cardiovascular system: S1 & S2 heard, RRR. No JVD, murmurs, rubs, gallops or clicks. No pedal edema. Gastrointestinal system: Abdomen is nondistended, soft and nontender. No organomegaly or masses felt. Normal bowel sounds heard. Central nervous system: Alert and oriented. No focal neurological deficits. Extremities: Symmetric 5 x 5 power. Skin: No rashes, lesions or ulcers Psychiatry: Judgement and insight appear normal. Mood & affect appropriate.     Data Reviewed: I have personally reviewed following labs and imaging studies  CBC: Recent Labs  Lab 11/20/19 1850 11/22/19 0510 11/23/19 0443  WBC 8.6 8.5 7.8  NEUTROABS 5.9  --   --  HGB 14.2 13.3 12.0  HCT 43.4 40.5 38.6  MCV 90.2 90.6 94.1  PLT 268 229 801   Basic Metabolic Panel: Recent Labs  Lab 11/20/19 1850 11/22/19 0510 11/23/19 0443  NA 136 137 139  K 3.8 3.9 3.7  CL 100 104 108  CO2 24 22 21*  GLUCOSE 136* 99 102*  BUN 14 18 24*  CREATININE 0.57 0.44 0.46  CALCIUM 9.9 9.3 9.1   GFR: Estimated Creatinine Clearance: 73.4 mL/min (by C-G formula based on SCr of 0.46 mg/dL). Liver Function Tests: Recent Labs  Lab 11/20/19 1850  AST 24  ALT 20  ALKPHOS 89  BILITOT 1.1  PROT 7.4  ALBUMIN 4.1   No results for input(s): LIPASE, AMYLASE in the last 168 hours. No results for input(s): AMMONIA  in the last 168 hours. Coagulation Profile: No results for input(s): INR, PROTIME in the last 168 hours. Cardiac Enzymes: No results for input(s): CKTOTAL, CKMB, CKMBINDEX, TROPONINI in the last 168 hours. BNP (last 3 results) No results for input(s): PROBNP in the last 8760 hours. HbA1C: Recent Labs    11/21/19 1758  HGBA1C 6.4*   CBG: Recent Labs  Lab 11/22/19 1152 11/22/19 1639 11/22/19 2126 11/23/19 0813 11/23/19 1128  GLUCAP 85 94 84 95 157*   Lipid Profile: No results for input(s): CHOL, HDL, LDLCALC, TRIG, CHOLHDL, LDLDIRECT in the last 72 hours. Thyroid Function Tests: Recent Labs    11/21/19 1758  TSH 1.629   Anemia Panel: No results for input(s): VITAMINB12, FOLATE, FERRITIN, TIBC, IRON, RETICCTPCT in the last 72 hours. Sepsis Labs: No results for input(s): PROCALCITON, LATICACIDVEN in the last 168 hours.  Recent Results (from the past 240 hour(s))  Respiratory Panel by RT PCR (Flu A&B, Covid) - Nasopharyngeal Swab     Status: None   Collection Time: 11/20/19 10:39 PM   Specimen: Nasopharyngeal Swab  Result Value Ref Range Status   SARS Coronavirus 2 by RT PCR NEGATIVE NEGATIVE Final    Comment: (NOTE) SARS-CoV-2 target nucleic acids are NOT DETECTED. The SARS-CoV-2 RNA is generally detectable in upper respiratoy specimens during the acute phase of infection. The lowest concentration of SARS-CoV-2 viral copies this assay can detect is 131 copies/mL. A negative result does not preclude SARS-Cov-2 infection and should not be used as the sole basis for treatment or other patient management decisions. A negative result may occur with  improper specimen collection/handling, submission of specimen other than nasopharyngeal swab, presence of viral mutation(s) within the areas targeted by this assay, and inadequate number of viral copies (<131 copies/mL). A negative result must be combined with clinical observations, patient history, and epidemiological  information. The expected result is Negative. Fact Sheet for Patients:  PinkCheek.be Fact Sheet for Healthcare Providers:  GravelBags.it This test is not yet ap proved or cleared by the [place] FDA and  has been authorized for detection and/or diagnosis of SARS-CoV-2 by FDA under an Emergency Use Authorization (EUA). This EUA will remain  in effect (meaning this test can be used) for the duration of the COVID-19 declaration under Section 564(b)(1) of the Act, 21 U.S.C. section 360bbb-3(b)(1), unless the authorization is terminated or revoked sooner.    Influenza A by PCR NEGATIVE NEGATIVE Final   Influenza B by PCR NEGATIVE NEGATIVE Final    Comment: (NOTE) The Xpert Xpress SARS-CoV-2/FLU/RSV assay is intended as an aid in  the diagnosis of influenza from Nasopharyngeal swab specimens and  should not be used as a sole basis for treatment.  Nasal washings and  aspirates are unacceptable for Xpert Xpress SARS-CoV-2/FLU/RSV  testing. Fact Sheet for Patients: PinkCheek.be Fact Sheet for Healthcare Providers: GravelBags.it This test is not yet approved or cleared by the [place] FDA and  has been authorized for detection and/or diagnosis of SARS-CoV-2 by  FDA under an Emergency Use Authorization (EUA). This EUA will remain  in effect (meaning this test can be used) for the duration of the  Covid-19 declaration under Section 564(b)(1) of the Act, 21  U.S.C. section 360bbb-3(b)(1), unless the authorization is  terminated or revoked. Performed at [facility], [place] [address], [place], [place] [address]   Urine culture     Status: Abnormal   Collection Time: 11/20/19 10:39 PM   Specimen: Urine, Random  Result Value Ref Range Status   Specimen Description   Final    URINE, RANDOM Performed at [facility], [place] [address],  [place], [place] [address]    Special Requests   Final    NONE Performed at [facility], [place] [address] [place], [place] [address]    Culture MULTIPLE ORGANISMS PRESENT, NONE PREDOMINANT (A)  Final   Report Status 11/22/2019 FINAL  Final         Radiology Studies: No results found.      Scheduled Meds: . aspirin EC  81 mg Oral Daily  . atorvastatin  40 mg Oral QHS  . Chlorhexidine Gluconate Cloth  6 each Topical Daily  . diphenhydrAMINE  25 mg Intravenous Once  . divalproex  125 mg Oral BID  . docusate sodium  100 mg Oral BID  . enoxaparin (LOVENOX) injection  40 mg Subcutaneous Q24H  . feeding supplement (ENSURE ENLIVE)  120 mL Oral BID  . feeding supplement (PRO-STAT SUGAR FREE 64)  30 mL Oral BID  . insulin aspart  0-5 Units Subcutaneous QHS  . insulin aspart  0-9 Units Subcutaneous TID WC  . irbesartan  300 mg Oral Daily  . LORazepam  1 mg Intravenous Once  . pantoprazole  40 mg Oral BID  . risperiDONE  0.5 mg Oral QHS  . senna  1 tablet Oral BID  . sertraline  25 mg Oral Daily  . sodium chloride flush  3 mL Intravenous Q12H   Continuous Infusions: . sodium chloride 75 mL/hr at 11/21/19 1821  . cefTRIAXone (ROCEPHIN)  IV    . cefTRIAXone (ROCEPHIN)  IV 1 g (11/23/19 0531)     LOS: 2 days     Georgette Shell, MD  11/23/2019, 3:27 PM

## 2019-11-23 NOTE — Progress Notes (Signed)
Pt is refusing to take morning medications and nursing care at this time. Pt is willing to try and drink an Ensure. MD notified via page.

## 2019-11-23 NOTE — Care Management Important Message (Signed)
Important Message  Patient Details IM Letter given to Roque Lias SW Case Manager to present to the Patient Name: Jeanette Rose MRN: [id number] Date of Birth: 01-05-1956   Medicare Important Message Given:  Yes     Kerin Salen 11/23/2019, 10:10 AM

## 2019-11-23 NOTE — Progress Notes (Signed)
Incoming phone call in pts room - I answered for pt. Her mother called and spoke to Iyana as I held the phone to her ear. She would answer her mothers ?s and agreed to take her medicine at her mothers request. I reminded pt of this & she took most of her medicine. However pt will not move / remosition - states "Leave me alone"

## 2019-11-23 NOTE — Consult Note (Signed)
[facility] Consult   Reason for Consult:  Need Reassessment Referring Physician: Internal Medicine     Patient Identification: Jeanette Rose MRN:  [id number] Principal Diagnosis: <principal problem not specified> Diagnosis:  Active Problems:   Depression   UTI (urinary tract infection)   Dysphagia   Total Time spent with patient: 15 minutes  Subjective:   Jeanette Rose is a 64 y.o. female was reevaluated.  She is awake, alert and oriented x3.  Denying suicidal or homicidal ideations.  Patient continues to be minimal throughout this assessment.  As she reports " It hurts when I swallow."  Patient reports she has not been resting well throughout the night. Avana was offered medication adjusments for difficulty sleeping, however patient has declined. Patient presents slightly irritable throughout this assessment.  Tanisha continues to refuse medications and food at this time due to "pain with swallowing." Case was dicussed with [place]: Medical team to consider plan and progression of care with family and  treatment plan. As charted by attending psychiatrist continue current  Recommendations.  Support, encouragement and reassurances was provided.   Noted: 11/22/2018 as attested by [employer]. Patient is very familiar to this psychiatric service.  Apparently presented with urinary tract symptoms.  She has a severe personality disorder as well as generalized anxiety disorder.  On previous admissions her family is unable to care for her, and the patient has been on multiple medications without great success.  She is also received ECT without great success.  Her visit to the hospital on this occasion is primarily physical in nature, and the patient needs long-term care and outpatient psychiatry and does not require psychiatric hospitalization at this time.  HPI:  Per admission assessment note: Jeanette Rose is an 64 y.o. female presenting to the ED with worsening depression.  When asked about stressors/triggers of depression, patient stated "I don't know". When asked how can we help you today, patient stated "I don't know". Patient reported normally feeling sad. Patient denied SI, HI and psychosis. Patient reported no sleep and poor appetite. Patient reported worsening depressive symptoms, crying spells, feelings of guilt, isolating and loss of interest. Patient was not able to recall some of the information needed for assessment. Patient was cooperative during assessment. Per triage note, patient is a resident of [facility]. Patient baseline is nonambulatory.  Past Psychiatric History:   Risk to Self: Suicidal Ideation: No Suicidal Intent: No Is patient at risk for suicide?: No Suicidal Plan?: No Access to Means: No What has been your use of drugs/alcohol within the last 12 months?: (none) How many times?: (0) Other Self Harm Risks: (n/a) Triggers for Past Attempts: None known Intentional Self Injurious Behavior: None Risk to Others: Homicidal Ideation: No Thoughts of Harm to Others: No Current Homicidal Intent: No Current Homicidal Plan: No Access to Homicidal Means: No History of harm to others?: No Assessment of Violence: None Noted Violent Behavior Description: (none) Does patient have access to weapons?: No Criminal Charges Pending?: No Does patient have a court date: No Prior Inpatient Therapy: Prior Inpatient Therapy: Yes Prior Therapy Dates: 08/2019 Prior Therapy Facilty/Provider(s): [facility] Reason for Treatment: Anxiety, SI.  Prior Outpatient Therapy: Prior Outpatient Therapy: Yes Prior Therapy Dates: 2020 Prior Therapy Facilty/Provider(s): [facility] Reason for Treatment: Med mang Does patient have an ACCT team?: No Does patient have Intensive In-House Services?  : No Does patient have [facility] services? : No Does patient have P4CC services?: No  Past Medical History:  Past Medical History:  Diagnosis Date  . Acute  acalculous cholecystitis s/p lap chole 04/03/2014 04/02/2014  . Allergy   . Anemia   . Anxiety   . Asthma   . Depression   . Fatty liver    noted on CT [facility] records  . Fibromyalgia   . GERD (gastroesophageal reflux disease)   . Hyperlipidemia associated with type 2 diabetes mellitus ([place])   . Hypertension   . Hypothyroid 2015   Transiently in 2015. Synthroid 37mg was stopped 05/07/2016 w/ tsh nml following.  . Obesity (BMI 30-39.9)   . OSA (obstructive sleep apnea)    non-compliant with CPAP  . Seborrheic dermatitis    [facility]  . Stroke ([facility]    TIA  . Type 2 diabetes mellitus ([place]     Past Surgical History:  Procedure Laterality Date  . BREAST REDUCTION SURGERY  06/2008  . CHOLECYSTECTOMY N/A 04/03/2014   Procedure: LAPAROSCOPIC CHOLECYSTECTOMY ;  Surgeon: SAdin Hector MD;  Location: [facility] ORS;  Service: General;  Laterality: N/A;  . REDUCTION MAMMAPLASTY    . TUBAL LIGATION     Family History:  Family History  Problem Relation Age of Onset  . Atrial fibrillation Mother   . Heart disease Father        CAD  . Diabetes Brother   . Cancer Maternal Grandmother   . Diabetes Paternal Grandmother   . Fibromyalgia Sister   . Colitis Sister    Family Psychiatric  History:  Social History:  Social History   Substance and Sexual Activity  Alcohol Use No     Social History   Substance and Sexual Activity  Drug Use No    Social History   Socioeconomic History  . Marital status: Divorced    Spouse name: Not on file  . Number of children: 2  . Years of education: Not on file  . Highest education level: GED or equivalent  Occupational History  . Occupation: Disability  Tobacco Use  . Smoking status: Former Smoker    Packs/day: 4.00    Years: 15.00    Pack years: 60.00    Types: Cigarettes    Quit date: 04/22/2004    Years since quitting: 15.5  . Smokeless tobacco: Never Used  . Tobacco comment: 4 pack year hx   Substance and Sexual Activity  . Alcohol use: No  . Drug use: No  . Sexual activity: Not Currently  Other Topics Concern  . Not on file  Social History Narrative   Pt lives alone in   Social Determinants of Health   Financial Resource Strain:   . Difficulty of Paying Living Expenses: Not on file  Food Insecurity:   . Worried About RCharity fundraiserin the Last Year: Not on file  . Ran Out of Food in the Last Year: Not on file  Transportation Needs:   . Lack of Transportation (Medical): Not on file  . Lack of Transportation (Non-Medical): Not on file  Physical Activity:   . Days of Exercise per Week: Not on file  . Minutes of Exercise per Session: Not on file  Stress:   . Feeling of Stress : Not on file  Social Connections:   . Frequency of Communication with Friends and Family: Not on file  . Frequency of Social Gatherings with Friends and Family: Not on file  . Attends Religious Services: Not on file  . Active Member of Clubs  or Organizations: Not on file  . Attends Archivist Meetings: Not on file  . Marital Status: Not on file   Additional Social History:    Allergies:   Allergies  Allergen Reactions  . Farxiga [Dapagliflozin] Anxiety and Other (See Comments)    Made depression and anxiety worse  . Rexulti [Brexpiprazole] Anxiety and Other (See Comments)    Made anxiety worse  . Trintellix [Vortioxetine] Anxiety and Other (See Comments)    Made anxiety worse    Labs:  Results for orders placed or performed during the hospital encounter of 11/20/19 (from the past 48 hour(s))  Hemoglobin A1c     Status: Abnormal   Collection Time: 11/21/19  5:58 PM  Result Value Ref Range   Hgb A1c MFr Bld 6.4 (H) 4.8 - 5.6 %    Comment: (NOTE) Pre diabetes:          5.7%-6.4% Diabetes:              >6.4% Glycemic control for   <7.0% adults with diabetes    Mean Plasma Glucose 136.98 mg/dL    Comment: Performed at [facility], [place] [address],  [place], [place] [address]  TSH     Status: None   Collection Time: 11/21/19  5:58 PM  Result Value Ref Range   TSH 1.629 0.350 - 4.500 uIU/mL    Comment: Performed by a 3rd Generation assay with a functional sensitivity of <=0.01 uIU/mL. Performed at [facility], [place] [address], [place], [place] [address]   Glucose, capillary     Status: None   Collection Time: 11/21/19  9:29 PM  Result Value Ref Range   Glucose-Capillary 97 70 - 99 mg/dL    Comment: Glucose reference range applies only to samples taken after fasting for at least 8 hours.  Basic metabolic panel     Status: None   Collection Time: 11/22/19  5:10 AM  Result Value Ref Range   Sodium 137 135 - 145 mmol/L   Potassium 3.9 3.5 - 5.1 mmol/L   Chloride 104 98 - 111 mmol/L   CO2 22 22 - 32 mmol/L   Glucose, Bld 99 70 - 99 mg/dL    Comment: Glucose reference range applies only to samples taken after fasting for at least 8 hours.   BUN 18 8 - 23 mg/dL   Creatinine, Ser 0.44 0.44 - 1.00 mg/dL   Calcium 9.3 8.9 - 10.3 mg/dL   GFR calc non Af Amer >60 >60 mL/min   GFR calc Af Amer >60 >60 mL/min   Anion gap 11 5 - 15    Comment: Performed at [facility], [place] [address] [place], [place] [address]  CBC     Status: None   Collection Time: 11/22/19  5:10 AM  Result Value Ref Range   WBC 8.5 4.0 - 10.5 K/uL   RBC 4.47 3.87 - 5.11 MIL/uL   Hemoglobin 13.3 12.0 - 15.0 g/dL   HCT 40.5 36.0 - 46.0 %   MCV 90.6 80.0 - 100.0 fL   MCH 29.8 26.0 - 34.0 pg   MCHC 32.8 30.0 - 36.0 g/dL   RDW 15.1 11.5 - 15.5 %   Platelets 229 150 - 400 K/uL   nRBC 0.0 0.0 - 0.2 %    Comment: Performed at [facility], [place] [address], [place],  [address]  Glucose, capillary     Status: None   Collection Time: 11/22/19  7:39 AM  Result Value Ref Range   Glucose-Capillary 98 70 - 99 mg/dL    Comment: Glucose reference range applies only to samples taken after fasting for at least 8 hours.    Comment 1 Notify RN    Comment 2 Document in Chart   Glucose, capillary     Status: None   Collection Time: 11/22/19 11:52 AM  Result Value Ref Range   Glucose-Capillary 85 70 - 99 mg/dL    Comment: Glucose reference range applies only to samples taken after fasting for at least 8 hours.   Comment 1 Notify RN    Comment 2 Document in Chart   Glucose, capillary     Status: None   Collection Time: 11/22/19  4:39 PM  Result Value Ref Range   Glucose-Capillary 94 70 - 99 mg/dL    Comment: Glucose reference range applies only to samples taken after fasting for at least 8 hours.   Comment 1 Notify RN    Comment 2 Document in Chart   Glucose, capillary     Status: None   Collection Time: 11/22/19  9:26 PM  Result Value Ref Range   Glucose-Capillary 84 70 - 99 mg/dL    Comment: Glucose reference range applies only to samples taken after fasting for at least 8 hours.  Basic metabolic panel     Status: Abnormal   Collection Time: 11/23/19  4:43 AM  Result Value Ref Range   Sodium 139 135 - 145 mmol/L   Potassium 3.7 3.5 - 5.1 mmol/L   Chloride 108 98 - 111 mmol/L   CO2 21 (L) 22 - 32 mmol/L   Glucose, Bld 102 (H) 70 - 99 mg/dL    Comment: Glucose reference range applies only to samples taken after fasting for at least 8 hours.   BUN 24 (H) 8 - 23 mg/dL   Creatinine, Ser 0.46 0.44 - 1.00 mg/dL   Calcium 9.1 8.9 - 10.3 mg/dL   GFR calc non Af Amer >60 >60 mL/min   GFR calc Af Amer >60 >60 mL/min   Anion gap 10 5 - 15    Comment: Performed at [facility], [place] [address], [place], [place] [address]  CBC     Status: None   Collection Time: 11/23/19  4:43 AM  Result Value Ref Range   WBC 7.8 4.0 - 10.5 K/uL   RBC 4.10 3.87 - 5.11 MIL/uL   Hemoglobin 12.0 12.0 - 15.0 g/dL   HCT 38.6 36.0 - 46.0 %   MCV 94.1 80.0 - 100.0 fL   MCH 29.3 26.0 - 34.0 pg   MCHC 31.1 30.0 - 36.0 g/dL   RDW 14.9 11.5 - 15.5 %   Platelets 207 150 - 400 K/uL   nRBC 0.0 0.0 - 0.2 %     Comment: Performed at [facility], [place] [address], [place], [place] [address]  Glucose, capillary     Status: None   Collection Time: 11/23/19  8:13 AM  Result Value Ref Range   Glucose-Capillary 95 70 - 99 mg/dL    Comment: Glucose reference range applies only to samples taken after fasting for at least 8 hours.  Glucose, capillary     Status: Abnormal   Collection Time: 11/23/19 11:28 AM  Result Value Ref Range   Glucose-Capillary 157 (H) 70 - 99 mg/dL    Comment: Glucose reference range applies only to samples taken after fasting for at least 8 hours.  Current Facility-Administered Medications  Medication Dose Route Frequency Provider Last Rate Last Admin  . 0.9 %  sodium chloride infusion   Intravenous Continuous Dahal, Marlowe Aschoff, MD 75 mL/hr at 11/21/19 1821 New Bag at 11/21/19 1821  . acetaminophen (TYLENOL) tablet 650 mg  650 mg Oral Q6H PRN Dahal, Marlowe Aschoff, MD       Or  . acetaminophen (TYLENOL) suppository 650 mg  650 mg Rectal Q6H PRN Dahal, Marlowe Aschoff, MD      . aspirin EC tablet 81 mg  81 mg Oral Daily Dorie Rank, MD      . atorvastatin (LIPITOR) tablet 40 mg  40 mg Oral Pete Glatter, MD   40 mg at 11/22/19 2123  . cefTRIAXone (ROCEPHIN) 1 g in sodium chloride 0.9 % 100 mL IVPB  1 g Intravenous Once Nanavati, Ankit, MD      . cefTRIAXone (ROCEPHIN) 1 g in sodium chloride 0.9 % 100 mL IVPB  1 g Intravenous Q24H Dahal, Binaya, MD 200 mL/hr at 11/23/19 0531 1 g at 11/23/19 0531  . Chlorhexidine Gluconate Cloth 2 % PADS 6 each  6 each Topical Daily Georgette Shell, MD      . diphenhydrAMINE (BENADRYL) injection 25 mg  25 mg Intravenous Once Nanavati, Ankit, MD      . divalproex (DEPAKOTE SPRINKLE) capsule 125 mg  125 mg Oral BID Dorie Rank, MD   125 mg at 11/22/19 2123  . docusate sodium (COLACE) capsule 100 mg  100 mg Oral BID Dorie Rank, MD   100 mg at 11/22/19 2123  . enoxaparin (LOVENOX) injection 40 mg  40 mg Subcutaneous Q24H Dahal, Marlowe Aschoff, MD   40 mg at  11/22/19 2136  . feeding supplement (ENSURE ENLIVE) (ENSURE ENLIVE) liquid 120 mL  120 mL Oral BID Dorie Rank, MD   120 mL at 11/22/19 2136  . feeding supplement (PRO-STAT SUGAR FREE 64) liquid 30 mL  30 mL Oral BID Dahal, Marlowe Aschoff, MD   30 mL at 11/22/19 2122  . hydrOXYzine (ATARAX/VISTARIL) tablet 10 mg  10 mg Oral TID PRN Dorie Rank, MD      . insulin aspart (novoLOG) injection 0-5 Units  0-5 Units Subcutaneous QHS Terrilee Croak, MD   Stopped at 11/22/19 2200  . insulin aspart (novoLOG) injection 0-9 Units  0-9 Units Subcutaneous TID WC Dahal, Binaya, MD      . irbesartan (AVAPRO) tablet 300 mg  300 mg Oral Daily Dorie Rank, MD      . LORazepam (ATIVAN) injection 1 mg  1 mg Intravenous Once Nanavati, Ankit, MD      . magnesium hydroxide (MILK OF MAGNESIA) suspension 30 mL  30 mL Oral Daily PRN Dahal, Binaya, MD      . ondansetron (ZOFRAN) tablet 4 mg  4 mg Oral Q6H PRN Dahal, Binaya, MD       Or  . ondansetron (ZOFRAN) injection 4 mg  4 mg Intravenous Q6H PRN Dahal, Binaya, MD      . pantoprazole (PROTONIX) EC tablet 40 mg  40 mg Oral BID Dorie Rank, MD   40 mg at 11/22/19 2123  . risperiDONE (RISPERDAL) tablet 0.5 mg  0.5 mg Oral QHS Dorie Rank, MD   0.5 mg at 11/22/19 2123  . senna (SENOKOT) tablet 8.6 mg  1 tablet Oral BID Terrilee Croak, MD   8.6 mg at 11/22/19 2122  . sertraline (ZOLOFT) tablet 25 mg  25 mg Oral Daily Dorie Rank, MD      . sodium chloride  flush (NS) 0.9 % injection 3 mL  3 mL Intravenous Q12H Dahal, Marlowe Aschoff, MD   3 mL at 11/21/19 2258  . sodium phosphate (FLEET) 7-19 GM/118ML enema 1 enema  1 enema Rectal Once PRN Dahal, Binaya, MD      . sorbitol 70 % solution 30 mL  30 mL Oral Daily PRN Dahal, Marlowe Aschoff, MD        Musculoskeletal: Strength & Muscle Tone: within normal limits Gait & Station: normal Patient leans: N/A  Psychiatric Specialty Exam: Physical Exam  Psychiatric: She has a normal mood and affect. Her behavior is normal.    Review of Systems  Blood pressure  104/66, pulse 80, temperature 97.7 F (36.5 C), temperature source Oral, resp. rate 16, height 5' 4.02" (1.626 m), weight 77.4 kg, SpO2 95 %.Body mass index is 29.28 kg/m.  General Appearance: Guarded  Eye Contact:  Fair  Speech:  Clear and Coherent  Volume:  Normal  Mood:  Anxious and Depressed  Affect:  Congruent  Thought Process:  Coherent  Orientation:  Full (Time, Place, and Person)  Thought Content:  Logical  Suicidal Thoughts:  No  Homicidal Thoughts:  No  Memory:  Immediate;   Fair  Judgement:  Intact  Insight:  Fair  Psychomotor Activity:  NA  Concentration:  Concentration: Fair  Recall:  [employer] of Knowledge:  Fair  Language:  Good  Akathisia:  No  Handed:  Right  AIMS (if indicated):     Assets:  Communication Skills Desire for Improvement Social Support  ADL's:  Intact  Cognition:  WNL  Sleep:       Disposition: Patient does not meet criteria for psychiatric inpatient admission. Supportive therapy provided about ongoing stressors. Discussed crisis plan, support from social network, calling 911, coming to the Emergency Department, and calling Suicide Hotline.  Derrill [place], NP 11/23/2019 11:35 AM

## 2019-11-23 NOTE — Progress Notes (Signed)
Pt is refusing her insulin at this time. Pt is also refusing any nursing care.

## 2019-11-23 NOTE — Progress Notes (Signed)
PT Cancellation Note  Patient Details Name: Jeanette Rose MRN: [id number] DOB: 1956/07/13   Cancelled Treatment:    Reason Eval/Treat Not Completed: Patient declined, no reason specified(Pt resting in bed, declined to participate in therapy evaluation. Stated "no" to therapists questions and "go away". Pt is non-ambulatory at baseline and stated she does not want PT. Please re-consult if pt changes her mind and wishes to participate.)   Verner Mould, DPT Physical Therapist with [facility] [phone]  11/23/2019 2:37 PM

## 2019-11-23 NOTE — TOC Initial Note (Addendum)
Transition of Care [facility]) - Initial/Assessment Note    Patient Details  Name: Jeanette Rose MRN: [id number] Date of Birth: 1956/07/04  [facility] CM/SW Contact:    Trish Mage, LCSW Phone Number: 11/23/2019, 4:30 PM  Clinical Narrative:   Patient from [facility]; went there from here about a month ago and has not progressed significantly.  Sister states patient is now "long term care."  I asked Juliann Pulse about this, and she referred me to [facility].  Left message and did not hear back from her.  Nevertheless, the plan is for patient to return there, which could happen as soon as tomorrow as UTI has been addressed, psych has cleared her and she is taking in some nutrition through [facility]. TOC will continue to follow during the course of hospitalization.  Addendum:  Heard back from [facility].  She states patient has not flipped to LTC as she has no payor source. Is accruing a bill as they figure out what to do.  Blanch Media is OK with d/c to them this weekend.  She asked that report be called to her at [phone].                Expected Discharge Plan: Skilled Nursing Facility Barriers to Discharge: No Barriers Identified   Patient Goals and CMS Choice        Expected Discharge Plan and Services Expected Discharge Plan: [place]                                              Prior Living Arrangements/Services                       Activities of Daily Living Home Assistive Devices/Equipment: Other (Comment) ADL Screening (condition at time of admission) Patient's cognitive ability adequate to safely complete daily activities?: No Is the patient deaf or have difficulty hearing?: No Does the patient have difficulty seeing, even when wearing glasses/contacts?: No Does the patient have difficulty concentrating, remembering, or making decisions?: No Patient able to express need for assistance with ADLs?: Yes Does the patient have difficulty dressing or  bathing?: No Independently performs ADLs?: Yes (appropriate for developmental age) Does the patient have difficulty walking or climbing stairs?: No Weakness of Legs: Both Weakness of Arms/Hands: Both  Permission Sought/Granted                  Emotional Assessment              Admission diagnosis:  UTI (urinary tract infection) [N39.0] Dysphagia, unspecified type [R13.10] Depression, unspecified depression type [F32.9] Patient Active Problem List   Diagnosis Date Noted  . Pressure injury of skin 11/23/2019  . Dysphagia   . UTI (urinary tract infection) 11/21/2019  . Elevated CPK 10/14/2019  . Sacral decubitus ulcer, stage II ([place]) 10/14/2019  . Acute metabolic encephalopathy 35/00/9381  . Sepsis secondary to UTI ([place]) 10/13/2019  . Depression 03/15/2019  . Generalized anxiety disorder 03/04/2019  . Benzodiazepine dependence, continuous ([place]) 03/04/2019  . Positive colorectal cancer screening using Cologuard test 02/15/2018  . Seborrheic dermatitis   . History of hypothyroidism 10/26/2017  . Elevated ferritin level 10/26/2017  . Vitamin D deficiency 10/26/2017  . Diabetic peripheral neuropathy associated with type 2 diabetes mellitus ([place]) 10/26/2017  . OSA (obstructive sleep apnea)   . Hyperlipidemia LDL  goal <100   . Major depressive disorder, recurrent episode, moderate ([place])   . Constipation 04/22/2014  . Steatohepatitis 04/03/2014  . Anxiety   . Type 2 diabetes mellitus with diabetic polyneuropathy, with long-term current use of insulin ([place])   . GERD (gastroesophageal reflux disease)   . Hypertension   . Obesity (BMI 30-39.9)    PCP:  Salvatore Marvel, PA-C Pharmacy:   [id number] [place], [place] [address]Phone: [phone]: [phone] [place]Phone: [phone]:  [phone] [place]Phone: [phone]: [phone]    Social Determinants of Health (SDOH) Interventions    Readmission Risk Interventions No flowsheet data found.

## 2019-11-23 NOTE — Progress Notes (Signed)
RN returned call to pt's sister and provided her with updates.

## 2019-11-24 DIAGNOSIS — R404 Transient alteration of awareness: Secondary | ICD-10-CM | POA: Diagnosis not present

## 2019-11-24 DIAGNOSIS — I469 Cardiac arrest, cause unspecified: Secondary | ICD-10-CM | POA: Diagnosis not present

## 2019-11-24 DIAGNOSIS — Z79899 Other long term (current) drug therapy: Secondary | ICD-10-CM | POA: Diagnosis not present

## 2019-11-24 DIAGNOSIS — E039 Hypothyroidism, unspecified: Secondary | ICD-10-CM | POA: Diagnosis not present

## 2019-11-24 DIAGNOSIS — F329 Major depressive disorder, single episode, unspecified: Secondary | ICD-10-CM | POA: Diagnosis not present

## 2019-11-24 DIAGNOSIS — K219 Gastro-esophageal reflux disease without esophagitis: Secondary | ICD-10-CM | POA: Diagnosis not present

## 2019-11-24 DIAGNOSIS — E1142 Type 2 diabetes mellitus with diabetic polyneuropathy: Secondary | ICD-10-CM | POA: Diagnosis not present

## 2019-11-24 DIAGNOSIS — R1310 Dysphagia, unspecified: Secondary | ICD-10-CM | POA: Diagnosis not present

## 2019-11-24 DIAGNOSIS — M6281 Muscle weakness (generalized): Secondary | ICD-10-CM | POA: Diagnosis not present

## 2019-11-24 DIAGNOSIS — Z7401 Bed confinement status: Secondary | ICD-10-CM | POA: Diagnosis not present

## 2019-11-24 DIAGNOSIS — Z87891 Personal history of nicotine dependence: Secondary | ICD-10-CM | POA: Diagnosis not present

## 2019-11-24 DIAGNOSIS — Z7984 Long term (current) use of oral hypoglycemic drugs: Secondary | ICD-10-CM | POA: Diagnosis not present

## 2019-11-24 DIAGNOSIS — G9341 Metabolic encephalopathy: Secondary | ICD-10-CM | POA: Diagnosis not present

## 2019-11-24 DIAGNOSIS — Z743 Need for continuous supervision: Secondary | ICD-10-CM | POA: Diagnosis not present

## 2019-11-24 DIAGNOSIS — E1165 Type 2 diabetes mellitus with hyperglycemia: Secondary | ICD-10-CM | POA: Diagnosis not present

## 2019-11-24 DIAGNOSIS — Z7982 Long term (current) use of aspirin: Secondary | ICD-10-CM | POA: Diagnosis not present

## 2019-11-24 DIAGNOSIS — Z794 Long term (current) use of insulin: Secondary | ICD-10-CM | POA: Diagnosis not present

## 2019-11-24 DIAGNOSIS — N390 Urinary tract infection, site not specified: Secondary | ICD-10-CM | POA: Diagnosis not present

## 2019-11-24 DIAGNOSIS — E785 Hyperlipidemia, unspecified: Secondary | ICD-10-CM | POA: Diagnosis not present

## 2019-11-24 DIAGNOSIS — J45909 Unspecified asthma, uncomplicated: Secondary | ICD-10-CM | POA: Diagnosis not present

## 2019-11-24 DIAGNOSIS — M2550 Pain in unspecified joint: Secondary | ICD-10-CM | POA: Diagnosis not present

## 2019-11-24 DIAGNOSIS — L89153 Pressure ulcer of sacral region, stage 3: Secondary | ICD-10-CM | POA: Diagnosis not present

## 2019-11-24 DIAGNOSIS — B9689 Other specified bacterial agents as the cause of diseases classified elsewhere: Secondary | ICD-10-CM | POA: Diagnosis not present

## 2019-11-24 DIAGNOSIS — I10 Essential (primary) hypertension: Secondary | ICD-10-CM | POA: Diagnosis not present

## 2019-11-24 DIAGNOSIS — I499 Cardiac arrhythmia, unspecified: Secondary | ICD-10-CM | POA: Diagnosis not present

## 2019-11-24 LAB — CBC
HCT: 37.1 % (ref 36.0–46.0)
Hemoglobin: 12.5 g/dL (ref 12.0–15.0)
MCH: 30.6 pg (ref 26.0–34.0)
MCHC: 33.7 g/dL (ref 30.0–36.0)
MCV: 90.9 fL (ref 80.0–100.0)
Platelets: 174 10*3/uL (ref 150–400)
RBC: 4.08 MIL/uL (ref 3.87–5.11)
RDW: 15 % (ref 11.5–15.5)
WBC: 6 10*3/uL (ref 4.0–10.5)
nRBC: 0 % (ref 0.0–0.2)

## 2019-11-24 LAB — GLUCOSE, CAPILLARY
Glucose-Capillary: 111 mg/dL — ABNORMAL HIGH (ref 70–99)
Glucose-Capillary: 90 mg/dL (ref 70–99)
Glucose-Capillary: 100 mg/dL — ABNORMAL HIGH (ref 70–99)

## 2019-11-24 LAB — BASIC METABOLIC PANEL
Anion gap: 9 (ref 5–15)
BUN: 10 mg/dL (ref 8–23)
CO2: 23 mmol/L (ref 22–32)
Calcium: 9 mg/dL (ref 8.9–10.3)
Chloride: 107 mmol/L (ref 98–111)
Creatinine, Ser: 0.37 mg/dL — ABNORMAL LOW (ref 0.44–1.00)
GFR calc Af Amer: >60 mL/min (ref >60)
GFR calc non Af Amer: >60 mL/min (ref >60)
Glucose, Bld: 118 mg/dL — ABNORMAL HIGH (ref 70–99)
Potassium: 3.3 mmol/L — ABNORMAL LOW (ref 3.5–5.1)
Sodium: 139 mmol/L (ref 135–145)

## 2019-11-24 MED ORDER — RISPERIDONE 0.5 MG PO TABS: 0.5000 mg | ORAL_TABLET | Freq: Every day | ORAL | 0 refills | Status: AC

## 2019-11-24 MED ORDER — RISPERIDONE MICROSPHERES ER 25 MG IM SRER
25.0000 mg | Freq: Once | INTRAMUSCULAR | Status: AC
  Administered 2019-11-24: 25 mg via INTRAMUSCULAR
  Filled 2019-11-24 (×2): qty 2

## 2019-11-24 MED ORDER — RISPERIDONE 0.5 MG PO TABS: 0.5000 mg | ORAL_TABLET | Freq: Every day | ORAL | Status: DC

## 2019-11-24 MED ORDER — POTASSIUM CHLORIDE CRYS ER 20 MEQ PO TBCR: 40.0000 meq | EXTENDED_RELEASE_TABLET | Freq: Once | ORAL | Status: DC

## 2019-11-24 MED ORDER — POTASSIUM CHLORIDE 10 MEQ/100ML IV SOLN
10.0000 meq | INTRAVENOUS | Status: AC
  Administered 2019-11-24 (×3): 10 meq via INTRAVENOUS
  Filled 2019-11-24 (×3): qty 100

## 2019-11-24 MED ORDER — RISPERIDONE MICROSPHERES ER 50 MG IM SRER: 50.0000 mg | INTRAMUSCULAR | 2 refills | Status: DC

## 2019-11-24 MED ORDER — RISPERIDONE MICROSPHERES ER 50 MG IM SRER: 50.0000 mg | INTRAMUSCULAR | Status: DC

## 2019-11-24 NOTE — Progress Notes (Signed)
CSW spoke to Ros[place] at [facility] where pt is a LTC resident who stated pt can return when D/C's.  CSW will continue to follow for D/C needs.  Alphonse Guild. Emory Gallentine, LCSW, LCAS, CSI Transitions of Care Clinical Social Worker Care Coordination Department Ph: [phone]

## 2019-11-24 NOTE — Progress Notes (Signed)
Pt transported by [facility] to [facility]. Pt only oriented to self. Pt stable for transport.

## 2019-11-24 NOTE — Discharge Summary (Signed)
Physician Discharge Summary  Jeanette Rose RDE:[id number] DOB: 26-Jan-1956 DOA: 11/20/2019  PCP: Salvatore Marvel, PA-C  Admit date: 11/20/2019 Discharge date: 11/24/2019  Admitted From: [facility]hc Disposition: ghc Recommendations for Outpatient Follow-up:  1. Follow up with PCP in 1-2 weeks 2. Please obtain BMP/CBC in one week 3    Please have psych see the patient in the next 1 week to see if they could repeat a dose of Risperdal IM every 14 days.  Home Health: None Equipment/Devices: None  Discharge Condition: Stable CODE STATUS: Full code Diet recommendation: Carb modified cardiac Brief/Interim Summary:63 y.o.femalewith PMH significant for HTN, T2DM, OSA, stroke/TIA, hypothyroidism, GERD, fibromyalgia, anxiety, depression. Patient wassentto the ED from SNF for poor oral intake, refusing food, drink and medication. Patient is a poor historian and answers only'I don't know'to most of the questions. I called and discussed with patient's sister [name] who is next of kin,patient does not have any designated medical power of attorney. For last few months, patient's depression has been getting worse. She was living alone and was hospitalized couple of times for behavioral symptoms. She also required ECT in the past for depression with psychosis. Last month, she was found at home, in her couch with urinary incontinence, apparently catatonic, not up from couch for 5 days. She washospitalized 1/16-1/21,treated for UTI. Her medicines were adjusted and she was discharged to [facility] for [facility]-term placement.  Patient sister recalls that the psychiatrist at the place started on Seroquel after which she had some noticeable improvements. For some reason, she was later switched to Risperdal. For last several days, patient has been complaining that she cannot swallow anything because of throat closing sensation. Providers at the facility examined her and did not find anything wrong  with the throat. Patient however would not agree with assessment and continued to refuse any intake. She also had a urinary retention and hence a Foley catheter was placed few days ago.  Patient was sent to the ED on 2/23 for further evaluation and management.  In the ED, patient has been afebrile, hemodynamically stable. Breathing comfortably on room air. CBC and BMP essentially normal Urinalysis showed turbid amber-colored urine with large amount hemoglobin large amount of leukocytes, 100 protein, many bacteria and more than 50 WBC.  Patient was seen by behavioral health counselor in the ED. Patient did not have suicidal or homicidal ideation and hence she was not admitted to behavioral health inpatient unit. Follow-up with psychiatry as an outpatient was recommended. I do not see any adjustment in medication done either.  Hospitalist service was called this afternoon for inpatient admission and management.   Discharge Diagnoses:  Active Problems:   Depression   UTI (urinary tract infection)   Dysphagia   Pressure injury of skin  #1 UTI with acute urinary retention-UA appeared dirty so urine culture was sent which came back as multiple organisms none predominant. Urine culture from last month showed more than 100,000 colonies of lactobacillus. Had Foley catheter placed at the facility few days prior to admission to the hospital for urinary retention. She received Rocephin for 3 days that she does not need to be on any antibiotics at this time.  #2 type 2 diabetes on Metformin hemoglobin A1c is 6.4.  #3 major depression with psychosis/anxiety and fibromyalgia-she continues to refuse to eat or drink. She was seen by psych in the ED and deemed not a candidate for inpatient psych treatment. Continue home medications if she does take them Depakote hydroxyzine Zoloft and  Respirdal. She received 1 dose of IM Risperdal 25 mg on 11/24/2019.  Please have her evaluated by psych in the  next 1 to 2 weeks to see if she could have or if she is a candidate to continue IM Risperdal every 14 days.  #4 essential hypertension on irbesartan  #5 history of hyperlipidemia continue statin  #6 history of stroke and TIA continue aspirin and statin  #7 history of obstructive sleep apnea patient noncompliant to CPAP  #8 history of hypothyroidism but not on any medicationsTSH normal                   Pressure Injury 11/21/19 Buttocks Right;Left Stage 3 -  Full thickness tissue loss. Subcutaneous fat may be visible but bone, tendon or muscle are NOT exposed. (Active)  11/21/19 1630  Location: Buttocks  Location Orientation: Right;Left  Staging: Stage 3 -  Full thickness tissue loss. Subcutaneous fat may be visible but bone, tendon or muscle are NOT exposed.  Wound Description (Comments):   Present on Admission: Yes     Pressure Injury Heel Left (Active)     Location: Heel  Location Orientation: Left  Staging:   Wound Description (Comments):   Present on Admission:      Pressure Injury 11/21/19 Sacrum Mid;Posterior Stage 3 -  Full thickness tissue loss. Subcutaneous fat may be visible but bone, tendon or muscle are NOT exposed. (Active)  11/21/19 1630  Location: Sacrum  Location Orientation: Mid;Posterior  Staging: Stage 3 -  Full thickness tissue loss. Subcutaneous fat may be visible but bone, tendon or muscle are NOT exposed.  Wound Description (Comments):   Present on Admission: Yes      Nutrition Problem: Inadequate oral intake Etiology: lethargy/confusion(acute psychosis)    Signs/Symptoms: per patient/family report(per notes, sister of pt reports no po intake in 5 days;  pt noted refusing meals and po medications during admission)     Interventions: Magic cup, Refer to RD note for recommendations  Estimated body mass index is 29.28 kg/m as calculated from the following:   Height as of this encounter: 5' 4.02" (1.626 m).   Weight as of this  encounter: 77.4 kg.  Discharge Instructions   Allergies as of 11/24/2019      Reactions   Farxiga [dapagliflozin] Anxiety, Other (See Comments)   Made depression and anxiety worse   Rexulti [brexpiprazole] Anxiety, Other (See Comments)   Made anxiety worse   Trintellix [vortioxetine] Anxiety, Other (See Comments)   Made anxiety worse      Medication List    TAKE these medications   aspirin 81 MG EC tablet Take 1 tablet (81 mg total) by mouth daily. Swallow whole.   atorvastatin 40 MG tablet Commonly known as: LIPITOR Take 1 tablet (40 mg total) by mouth at bedtime.   divalproex 125 MG capsule Commonly known as: DEPAKOTE SPRINKLE Take 125 mg by mouth 2 (two) times daily.   docusate sodium 100 MG capsule Commonly known as: COLACE Take 1 capsule (100 mg total) by mouth 2 (two) times daily.   feeding supplement (PRO-STAT SUGAR FREE 64) Liqd Take 30 mLs by mouth in the morning and at bedtime.   hydrOXYzine 10 MG tablet Commonly known as: ATARAX/VISTARIL Take 1 tablet (10 mg total) by mouth 3 (three) times daily as needed for anxiety.   irbesartan 300 MG tablet Commonly known as: AVAPRO Take 1 tablet (300 mg total) by mouth daily.   metFORMIN 500 MG 24 hr tablet Commonly known as:  GLUCOPHAGE-XR Take 1,000 mg by mouth daily with breakfast.   Nutritional Drink Liqd Take 120 mLs by mouth in the morning and at bedtime. Med plus 2.0   omeprazole 20 MG capsule Commonly known as: PRILOSEC Take 1 capsule (20 mg total) by mouth daily.   pantoprazole 40 MG tablet Commonly known as: PROTONIX Take 1 tablet (40 mg total) by mouth 2 (two) times daily.   risperiDONE 1 MG/ML oral solution Commonly known as: RISPERDAL Take 0.5 mg by mouth at bedtime.   sertraline 25 MG tablet Commonly known as: ZOLOFT Take 1 tablet (25 mg total) by mouth daily.      Follow-up [facility] In 1 week.   Specialty: Gastroenterology Contact information: [place] [address] [place] , [place] Follow up.   Specialty: Family Medicine Contact information: [address] [place] [address] [phone]          Allergies  Allergen Reactions  . Farxiga [Dapagliflozin] Anxiety and Other (See Comments)    Made depression and anxiety worse  . Rexulti [Brexpiprazole] Anxiety and Other (See Comments)    Made anxiety worse  . Trintellix [Vortioxetine] Anxiety and Other (See Comments)    Made anxiety worse    Consultations: Psych  Procedures/Studies:  No results found. (Echo, Carotid, EGD, Colonoscopy, ERCP)    Subjective: She is awake alert resting in bed  Discharge Exam: Vitals:   11/23/19 2044 11/24/19 0605  BP: 135/72 126/66  Pulse: 75 61  Resp: 18 18  Temp: 98.1 F (36.7 C) 97.7 F (36.5 C)  SpO2: 100% 100%   Vitals:   11/23/19 0540 11/23/19 1352 11/23/19 2044 11/24/19 0605  BP: 104/66 132/69 135/72 126/66  Pulse: 80 87 75 61  Resp: 16 18 18 18   Temp: 97.7 F (36.5 C) (!) 97.5 F (36.4 C) 98.1 F (36.7 C) 97.7 F (36.5 C)  TempSrc: Oral Oral Oral Oral  SpO2: 95% 100% 100% 100%  Weight:      Height:        General: Pt is alert, awake, not in acute distress Cardiovascular: RRR, S1/S2 +, no rubs, no gallops Respiratory: CTA bilaterally, no wheezing, no rhonchi Abdominal: Soft, NT, ND, bowel sounds + Extremities: no edema, no cyanosis    The results of significant diagnostics from this hospitalization (including imaging, microbiology, ancillary and laboratory) are listed below for reference.     Microbiology: Recent Results (from the past 240 hour(s))  Respiratory Panel by RT PCR (Flu A&B, Covid) - Nasopharyngeal Swab     Status: None   Collection Time: 11/20/19 10:39 PM   Specimen: Nasopharyngeal Swab  Result Value Ref Range Status   SARS Coronavirus 2 by RT PCR NEGATIVE NEGATIVE Final    Comment: (NOTE) SARS-CoV-2 target  nucleic acids are NOT DETECTED. The SARS-CoV-2 RNA is generally detectable in upper respiratoy specimens during the acute phase of infection. The lowest concentration of SARS-CoV-2 viral copies this assay can detect is 131 copies/mL. A negative result does not preclude SARS-Cov-2 infection and should not be used as the sole basis for treatment or other patient management decisions. A negative result may occur with  improper specimen collection/handling, submission of specimen other than nasopharyngeal swab, presence of viral mutation(s) within the areas targeted by this assay, and inadequate number of viral copies (<131 copies/mL). A negative result must be combined with clinical observations, patient history, and epidemiological information. The  expected result is Negative. Fact Sheet for Patients:  PinkCheek.be Fact Sheet for Healthcare Providers:  GravelBags.it This test is not yet ap proved or cleared by the [place] FDA and  has been authorized for detection and/or diagnosis of SARS-CoV-2 by FDA under an Emergency Use Authorization (EUA). This EUA will remain  in effect (meaning this test can be used) for the duration of the COVID-19 declaration under Section 564(b)(1) of the Act, 21 U.S.C. section 360bbb-3(b)(1), unless the authorization is terminated or revoked sooner.    Influenza A by PCR NEGATIVE NEGATIVE Final   Influenza B by PCR NEGATIVE NEGATIVE Final    Comment: (NOTE) The Xpert Xpress SARS-CoV-2/FLU/RSV assay is intended as an aid in  the diagnosis of influenza from Nasopharyngeal swab specimens and  should not be used as a sole basis for treatment. Nasal washings and  aspirates are unacceptable for Xpert Xpress SARS-CoV-2/FLU/RSV  testing. Fact Sheet for Patients: PinkCheek.be Fact Sheet for Healthcare Providers: GravelBags.it This test is not  yet approved or cleared by the [place] FDA and  has been authorized for detection and/or diagnosis of SARS-CoV-2 by  FDA under an Emergency Use Authorization (EUA). This EUA will remain  in effect (meaning this test can be used) for the duration of the  Covid-19 declaration under Section 564(b)(1) of the Act, 21  U.S.C. section 360bbb-3(b)(1), unless the authorization is  terminated or revoked. Performed at [facility], [place] [address] [place], [place] [address]   Urine culture     Status: Abnormal   Collection Time: 11/20/19 10:39 PM   Specimen: Urine, Random  Result Value Ref Range Status   Specimen Description   Final    URINE, RANDOM Performed at [facility], [place] [address] [place], [place] [address]    Special Requests   Final    NONE Performed at [facility] [place] [address] [place], [place] [address]    Culture MULTIPLE ORGANISMS PRESENT, NONE PREDOMINANT (A)  Final   Report Status 11/22/2019 FINAL  Final  SARS CORONAVIRUS 2 (TAT 6-24 HRS) Nasopharyngeal Nasopharyngeal Swab     Status: None   Collection Time: 11/23/19  4:11 PM   Specimen: Nasopharyngeal Swab  Result Value Ref Range Status   SARS Coronavirus 2 NEGATIVE NEGATIVE Final    Comment: (NOTE) SARS-CoV-2 target nucleic acids are NOT DETECTED. The SARS-CoV-2 RNA is generally detectable in upper and lower respiratory specimens during the acute phase of infection. Negative results do not preclude SARS-CoV-2 infection, do not rule out co-infections with other pathogens, and should not be used as the sole basis for treatment or other patient management decisions. Negative results must be combined with clinical observations, patient history, and epidemiological information. The expected result is Negative. Fact Sheet for Patients: SugarRoll.be Fact Sheet for Healthcare Providers: https://www.woods-mathews.com/ This test is  not yet approved or cleared by the [place] FDA and  has been authorized for detection and/or diagnosis of SARS-CoV-2 by FDA under an Emergency Use Authorization (EUA). This EUA will remain  in effect (meaning this test can be used) for the duration of the COVID-19 declaration under Section 56 4(b)(1) of the Act, 21 U.S.C. section 360bbb-3(b)(1), unless the authorization is terminated or revoked sooner. Performed at [facility], [place] [address] [place], [place] [address]      Labs: BNP (last 3 results) No results for input(s): BNP in the last 8760 hours. Basic Metabolic Panel: Recent Labs  Lab 11/20/19 1850  11/22/19 0510 11/23/19 0443 11/24/19 0539  NA 136 137 139 139  K 3.8 3.9 3.7 3.3*  CL 100 104 108 107  CO2 24 22 21* 23  GLUCOSE 136* 99 102* 118*  BUN 14 18 24* 10  CREATININE 0.57 0.44 0.46 0.37*  CALCIUM 9.9 9.3 9.1 9.0   Liver Function Tests: Recent Labs  Lab 11/20/19 1850  AST 24  ALT 20  ALKPHOS 89  BILITOT 1.1  PROT 7.4  ALBUMIN 4.1   No results for input(s): LIPASE, AMYLASE in the last 168 hours. No results for input(s): AMMONIA in the last 168 hours. CBC: Recent Labs  Lab 11/20/19 1850 11/22/19 0510 11/23/19 0443 11/24/19 0539  WBC 8.6 8.5 7.8 6.0  NEUTROABS 5.9  --   --   --   HGB 14.2 13.3 12.0 12.5  HCT 43.4 40.5 38.6 37.1  MCV 90.2 90.6 94.1 90.9  PLT 268 229 207 174   Cardiac Enzymes: No results for input(s): CKTOTAL, CKMB, CKMBINDEX, TROPONINI in the last 168 hours. BNP: Invalid input(s): POCBNP CBG: Recent Labs  Lab 11/22/19 2126 11/23/19 0813 11/23/19 1128 11/23/19 1605 11/23/19 2138  GLUCAP 84 95 157* 202* 125*   D-Dimer No results for input(s): DDIMER in the last 72 hours. Hgb A1c Recent Labs    11/21/19 1758  HGBA1C 6.4*   Lipid Profile No results for input(s): CHOL, HDL, LDLCALC, TRIG, CHOLHDL, LDLDIRECT in the last 72 hours. Thyroid function studies Recent Labs    11/21/19 1758  TSH 1.629    Anemia work up No results for input(s): VITAMINB12, FOLATE, FERRITIN, TIBC, IRON, RETICCTPCT in the last 72 hours. Urinalysis    Component Value Date/Time   COLORURINE AMBER (A) 11/20/2019 2239   APPEARANCEUR TURBID (A) 11/20/2019 2239   LABSPEC 1.019 11/20/2019 2239   PHURINE 5.0 11/20/2019 2239   GLUCOSEU NEGATIVE 11/20/2019 2239   HGBUR LARGE (A) 11/20/2019 2239   BILIRUBINUR SMALL (A) 11/20/2019 2239   BILIRUBINUR small (A) 01/05/2018 1232   KETONESUR 20 (A) 11/20/2019 2239   PROTEINUR 100 (A) 11/20/2019 2239   UROBILINOGEN 1.0 01/05/2018 1232   UROBILINOGEN 2.0 (H) 04/05/2014 1208   NITRITE NEGATIVE 11/20/2019 2239   LEUKOCYTESUR LARGE (A) 11/20/2019 2239   Sepsis Labs Invalid input(s): PROCALCITONIN,  WBC,  LACTICIDVEN Microbiology Recent Results (from the past 240 hour(s))  Respiratory Panel by RT PCR (Flu A&B, Covid) - Nasopharyngeal Swab     Status: None   Collection Time: 11/20/19 10:39 PM   Specimen: Nasopharyngeal Swab  Result Value Ref Range Status   SARS Coronavirus 2 by RT PCR NEGATIVE NEGATIVE Final    Comment: (NOTE) SARS-CoV-2 target nucleic acids are NOT DETECTED. The SARS-CoV-2 RNA is generally detectable in upper respiratoy specimens during the acute phase of infection. The lowest concentration of SARS-CoV-2 viral copies this assay can detect is 131 copies/mL. A negative result does not preclude SARS-Cov-2 infection and should not be used as the sole basis for treatment or other patient management decisions. A negative result may occur with  improper specimen collection/handling, submission of specimen other than nasopharyngeal swab, presence of viral mutation(s) within the areas targeted by this assay, and inadequate number of viral copies (<131 copies/mL). A negative result must be combined with clinical observations, patient history, and epidemiological information. The expected result is Negative. Fact Sheet for Patients:   PinkCheek.be Fact Sheet for Healthcare Providers:  GravelBags.it This test is not yet ap proved or cleared by the [place] FDA and  has  been authorized for detection and/or diagnosis of SARS-CoV-2 by FDA under an Emergency Use Authorization (EUA). This EUA will remain  in effect (meaning this test can be used) for the duration of the COVID-19 declaration under Section 564(b)(1) of the Act, 21 U.S.C. section 360bbb-3(b)(1), unless the authorization is terminated or revoked sooner.    Influenza A by PCR NEGATIVE NEGATIVE Final   Influenza B by PCR NEGATIVE NEGATIVE Final    Comment: (NOTE) The Xpert Xpress SARS-CoV-2/FLU/RSV assay is intended as an aid in  the diagnosis of influenza from Nasopharyngeal swab specimens and  should not be used as a sole basis for treatment. Nasal washings and  aspirates are unacceptable for Xpert Xpress SARS-CoV-2/FLU/RSV  testing. Fact Sheet for Patients: PinkCheek.be Fact Sheet for Healthcare Providers: GravelBags.it This test is not yet approved or cleared by the [place] FDA and  has been authorized for detection and/or diagnosis of SARS-CoV-2 by  FDA under an Emergency Use Authorization (EUA). This EUA will remain  in effect (meaning this test can be used) for the duration of the  Covid-19 declaration under Section 564(b)(1) of the Act, 21  U.S.C. section 360bbb-3(b)(1), unless the authorization is  terminated or revoked. Performed at [facility], [place] [address] [place], [place] [address]   Urine culture     Status: Abnormal   Collection Time: 11/20/19 10:39 PM   Specimen: Urine, Random  Result Value Ref Range Status   Specimen Description   Final    URINE, RANDOM Performed at [facility], [place] [address] [place], [place] [address]    Special Requests   Final    NONE Performed at [facility], [place] [address] [place], [place] [address]    Culture MULTIPLE ORGANISMS PRESENT, NONE PREDOMINANT (A)  Final   Report Status 11/22/2019 FINAL  Final  SARS CORONAVIRUS 2 (TAT 6-24 HRS) Nasopharyngeal Nasopharyngeal Swab     Status: None   Collection Time: 11/23/19  4:11 PM   Specimen: Nasopharyngeal Swab  Result Value Ref Range Status   SARS Coronavirus 2 NEGATIVE NEGATIVE Final    Comment: (NOTE) SARS-CoV-2 target nucleic acids are NOT DETECTED. The SARS-CoV-2 RNA is generally detectable in upper and lower respiratory specimens during the acute phase of infection. Negative results do not preclude SARS-CoV-2 infection, do not rule out co-infections with other pathogens, and should not be used as the sole basis for treatment or other patient management decisions. Negative results must be combined with clinical observations, patient history, and epidemiological information. The expected result is Negative. Fact Sheet for Patients: SugarRoll.be Fact Sheet for Healthcare Providers: https://www.woods-mathews.com/ This test is not yet approved or cleared by the [place] FDA and  has been authorized for detection and/or diagnosis of SARS-CoV-2 by FDA under an Emergency Use Authorization (EUA). This EUA will remain  in effect (meaning this test can be used) for the duration of the COVID-19 declaration under Section 56 4(b)(1) of the Act, 21 U.S.C. section 360bbb-3(b)(1), unless the authorization is terminated or revoked sooner. Performed at [facility], [place] [address], [place], [place] [address]      Time coordinating discharge:  39 minutes  SIGNED:   Georgette Shell, MD  [facility] 11/24/2019, 10:16 AM Pager   If 7PM-7AM, please contact night-coverage [email or web address] Password [email or web address]

## 2019-11-24 NOTE — Progress Notes (Signed)
Patient is refusing to take PO medications or dietary supplement drinks stating she cannot swallow.

## 2019-11-24 NOTE — Progress Notes (Signed)
CSW received a call from [place] at [facility] stating the patient has been offered a bed and has been accepted and that the pt can arrive on 2/27.  The pt's accepting doctor is SNF MD.  The room number will be 114P.  The number for report is [phone].  CSW will update RN.  EDP aware.Alphonse Guild. Elwin Tsou, Latanya Presser, LCAS Clinical Social Worker Ph: [phone]

## 2019-11-24 NOTE — Progress Notes (Signed)
CSW spoke to provider who stated pending pt's IM pt can D/C.  CSW called pt's RN who confirmed pt has not received her IM yet, but will shortly once the IM is received by pharmacy.  RN will reach out to the pharmacy and will let the CSW know if there will be a delay in which case CSW will reach out to [facility] leadership.  CSW will continue to follow for D/C needs.  Alphonse Guild. Laban Orourke, LCSW, LCAS, CSI Transitions of Care Clinical Social Worker Care Coordination Department Ph: [phone]

## 2019-11-24 NOTE — Progress Notes (Signed)
Attempted to contact [facility] to give report to nurse. Call was unanswered. Provided contact info to [facility] receptionist who stated she would give nurse info directly.

## 2019-11-27 ENCOUNTER — Emergency Department (HOSPITAL_COMMUNITY)
Admission: EM | Admit: 2019-11-27 | Discharge: 2019-12-27 | Disposition: E | Payer: Medicare Other | Attending: Emergency Medicine | Admitting: Emergency Medicine

## 2019-11-27 DIAGNOSIS — E1142 Type 2 diabetes mellitus with diabetic polyneuropathy: Secondary | ICD-10-CM | POA: Diagnosis not present

## 2019-11-27 DIAGNOSIS — Z87891 Personal history of nicotine dependence: Secondary | ICD-10-CM | POA: Diagnosis not present

## 2019-11-27 DIAGNOSIS — J45909 Unspecified asthma, uncomplicated: Secondary | ICD-10-CM | POA: Insufficient documentation

## 2019-11-27 DIAGNOSIS — Z7984 Long term (current) use of oral hypoglycemic drugs: Secondary | ICD-10-CM | POA: Insufficient documentation

## 2019-11-27 DIAGNOSIS — Z7982 Long term (current) use of aspirin: Secondary | ICD-10-CM | POA: Insufficient documentation

## 2019-11-27 DIAGNOSIS — I469 Cardiac arrest, cause unspecified: Secondary | ICD-10-CM

## 2019-11-27 DIAGNOSIS — E039 Hypothyroidism, unspecified: Secondary | ICD-10-CM | POA: Diagnosis not present

## 2019-11-27 DIAGNOSIS — Z79899 Other long term (current) drug therapy: Secondary | ICD-10-CM | POA: Diagnosis not present

## 2019-11-27 MED ORDER — MAGNESIUM SULFATE 50 % IJ SOLN
INTRAMUSCULAR | Status: AC | PRN
  Administered 2019-11-27: 2 g via INTRAVENOUS

## 2019-11-27 MED FILL — Medication: Qty: 1 | Status: AC

## 2019-12-27 NOTE — Code Documentation (Signed)
Patient time of death occurred at 10:10.

## 2019-12-27 NOTE — Progress Notes (Signed)
Responded to ED page to support staff.  Patient deceased. Spoke with patient sister Dara Hoyer at [phone] and patient's daugther Diona Fanti  [phone] address [place], [place] [address]. Got necessary information for nurse and passed to [place].  Daughter said she wanted Shirley Friar in [place], to have body. Provided emotional and spiritual support and information sharing.  Chaplain available as needed.  Jaclynn Major, [place], [facility], Pager [phone]

## 2019-12-27 NOTE — Code Documentation (Addendum)
Pt [facility]. Staff reported pt became unresponsive, found pulseless and apneic by ems and CPR initiated. Had multiple rounds of Epi pta with + ROSC. Pt was being paced and epi gtt started pta. cbg 216. Estimated downtime was 08:39

## 2019-12-27 NOTE — ED Notes (Signed)
Have spoken with patient's daughter and sister and updated them on patient's death.

## 2019-12-27 NOTE — ED Provider Notes (Signed)
[facility] EMERGENCY DEPARTMENT Provider Note   CSN: [id number] Arrival date & time: 12-27-19  1000  LEVEL 5 CAVEAT - UNRESPONSIVE History Chief Complaint  Patient presents with  . Cardiac Arrest    Jeanette Rose is a 64 y.o. female.  HPI 64 year old female presents with cardiac arrest.  History is by EMS.  Patient was reportedly talking to nursing home staff and then became unresponsive.  CPR initiated around 8:30 AM.  On and off ROSC for a maximum of 8 minutes.  Has been given more than 10 epinephrines and comes in on epi drip and being paced.  Glucose 230.   Past Medical History:  Diagnosis Date  . Acute acalculous cholecystitis s/p lap chole 04/03/2014 04/02/2014  . Allergy   . Anemia   . Anxiety   . Asthma   . Depression   . Fatty liver    noted on CT per [facility] records  . Fibromyalgia   . GERD (gastroesophageal reflux disease)   . Hyperlipidemia associated with type 2 diabetes mellitus ([place])   . Hypertension   . Hypothyroid 2015   Transiently in 2015. Synthroid 78mg was stopped 05/07/2016 w/ tsh nml following.  . Obesity (BMI 30-39.9)   . OSA (obstructive sleep apnea)    non-compliant with CPAP  . Seborrheic dermatitis    [facility] face [facility]  . Stroke ([facility]    TIA  . Type 2 diabetes mellitus ([facility]     Patient Active Problem List   Diagnosis Date Noted  . Pressure injury of skin 11/23/2019  . Dysphagia   . UTI (urinary tract infection) 11/21/2019  . Elevated CPK 10/14/2019  . Sacral decubitus ulcer, stage II ([facility] 10/14/2019  . Acute metabolic encephalopathy 079/39/0300 . Sepsis secondary to UTI ([facility] 10/13/2019  . Depression 03/15/2019  . Generalized anxiety disorder 03/04/2019  . Benzodiazepine dependence, continuous ([facility] 03/04/2019  . Positive colorectal cancer screening using Cologuard test 02/15/2018  . Seborrheic dermatitis   . History of hypothyroidism 10/26/2017  . Elevated ferritin level 10/26/2017    . Vitamin D deficiency 10/26/2017  . Diabetic peripheral neuropathy associated with type 2 diabetes mellitus ([place] 10/26/2017  . OSA (obstructive sleep apnea)   . Hyperlipidemia LDL goal <100   . Major depressive disorder, recurrent episode, moderate ([facility]   . Constipation 04/22/2014  . Steatohepatitis 04/03/2014  . Anxiety   . Type 2 diabetes mellitus with diabetic polyneuropathy, with long-term current use of insulin ([facility]   . GERD (gastroesophageal reflux disease)   . Hypertension   . Obesity (BMI 30-39.9)     Past Surgical History:  Procedure Laterality Date  . BREAST REDUCTION SURGERY  06/2008  . CHOLECYSTECTOMY N/A 04/03/2014   Procedure: LAPAROSCOPIC CHOLECYSTECTOMY ;  Surgeon: SAdin Hector MD;  Location: [facility] ORS;  Service: General;  Laterality: N/A;  . REDUCTION MAMMAPLASTY    . TUBAL LIGATION       OB History   No obstetric history on file.     Family History  Problem Relation Age of Onset  . Atrial fibrillation Mother   . Heart disease Father        CAD  . Diabetes Brother   . Cancer Maternal Grandmother   . Diabetes Paternal Grandmother   . Fibromyalgia Sister   . Colitis Sister     Social History   Tobacco Use  . Smoking status: Former Smoker    Packs/day: 4.00    Years: 15.00  Pack years: 60.00    Types: Cigarettes    Quit date: 04/22/2004    Years since quitting: 15.6  . Smokeless tobacco: Never Used  . Tobacco comment: 4 pack year hx  Substance Use Topics  . Alcohol use: No  . Drug use: No    Home Medications Prior to Admission medications   Medication Sig Start Date End Date Taking? Authorizing Provider  Amino Acids-Protein Hydrolys (FEEDING SUPPLEMENT, PRO-STAT SUGAR FREE 64,) LIQD Take 30 mLs by mouth in the morning and at bedtime.    [provider]  aspirin 81 MG EC tablet Take 1 tablet (81 mg total) by mouth daily. Swallow whole. 09/27/19   Money, Lowry Ram, FNP  atorvastatin (LIPITOR) 40 MG tablet Take 1 tablet (40 mg  total) by mouth at bedtime. 10/18/19 11/20/19  Kayleen Memos, DO  divalproex (DEPAKOTE SPRINKLE) 125 MG capsule Take 125 mg by mouth 2 (two) times daily.    [provider]  docusate sodium (COLACE) 100 MG capsule Take 1 capsule (100 mg total) by mouth 2 (two) times daily. 09/27/19   Money, Lowry Ram, FNP  hydrOXYzine (ATARAX/VISTARIL) 10 MG tablet Take 1 tablet (10 mg total) by mouth 3 (three) times daily as needed for anxiety. 10/18/19   Kayleen Memos, DO  irbesartan (AVAPRO) 300 MG tablet Take 1 tablet (300 mg total) by mouth daily. 09/27/19   Money, Lowry Ram, FNP  metFORMIN (GLUCOPHAGE-XR) 500 MG 24 hr tablet Take 1,000 mg by mouth daily with breakfast. 09/27/19   [provider]  Nutritional Supplements (NUTRITIONAL DRINK) LIQD Take 120 mLs by mouth in the morning and at bedtime. Med plus 2.0    [provider]  omeprazole (PRILOSEC) 20 MG capsule Take 1 capsule (20 mg total) by mouth daily. 11/21/19   Varney Biles, MD  pantoprazole (PROTONIX) 40 MG tablet Take 1 tablet (40 mg total) by mouth 2 (two) times daily. 09/27/19   Money, Lowry Ram, FNP  risperiDONE (RISPERDAL) 0.5 MG tablet Take 1 tablet (0.5 mg total) by mouth at bedtime. 12/08/19   Georgette Shell, MD  sertraline (ZOLOFT) 25 MG tablet Take 1 tablet (25 mg total) by mouth daily. 10/18/19 12/17/19  Kayleen Memos, DO    Allergies    Farxiga [dapagliflozin], Rexulti [brexpiprazole], and Trintellix [vortioxetine]  Review of Systems   Review of Systems  Unable to perform ROS: Patient unresponsive    Physical Exam Updated Vital Signs There were no vitals taken for this visit.  Physical Exam Vitals and nursing note reviewed.  Constitutional:      Appearance: She is well-developed.  HENT:     Head: Normocephalic and atraumatic.     Right Ear: External ear normal.     Left Ear: External ear normal.     Nose: Nose normal.  Eyes:     General:        Right eye: No discharge.        Left eye: No  discharge.     Comments: Pupils fixed/dilated  Cardiovascular:     Pulses:          Femoral pulses are 0 on the right side and 0 on the left side. Pulmonary:     Comments: intubated with king airway, no spontaneous respirations Abdominal:     Palpations: Abdomen is soft.     Tenderness: There is no abdominal tenderness.  Skin:    General: Skin is warm and dry.  Neurological:     Mental  Status: She is unresponsive.  Psychiatric:        Mood and Affect: Mood is not anxious.     ED Results / Procedures / Treatments   Labs (all labs ordered are listed, but only abnormal results are displayed) Labs Reviewed - No data to display  EKG None  Radiology No results found.  Procedures CPR  Date/Time: 12-20-19 10:16 AM Performed by: Sherwood Gambler, MD Authorized by: Sherwood Gambler, MD  CPR Procedure Details:    ACLS/BLS initiated by EMS: Yes     CPR/ACLS performed in the ED: Yes     Outcome: Pt declared dead    CPR performed via ACLS guidelines under my direct supervision.  See RN documentation for details including defibrillator use, medications, doses and timing.   (including critical care time)  Medications Ordered in ED Medications  magnesium sulfate (IV Push/IM) injection (2 g Intravenous Given 2019/12/20 1006)    ED Course  I have reviewed the triage vital signs and the nursing notes.  Pertinent labs & imaging results that were available during my care of the patient were reviewed by me and considered in my medical decision making (see chart for details).    MDM Rules/Calculators/A&P                      Patient has had prolonged CPR. At this point, she has PEA and then asytole. I think chances of recovery/neurology recovery is very low. She is on psychiatric meds, so was given magnesium, but has not had any ventricular arrhythmias with EMS or here, so torsades is less likely. Due to this and prolonged CPR, the resuscitation was stopped. I do not see any significant  cardiac activity on bedside echo. Time of death 81. Discussed with Jeanette Caprice, medical examiner, and she would not be an ME case because of her comorbidities.  Updated contact, [name].  Spruha Weight was evaluated in Emergency Department on 12/20/2019 for the symptoms described in the history of present illness. She was evaluated in the context of the global COVID-19 pandemic, which necessitated consideration that the patient might be at risk for infection with the SARS-CoV-2 virus that causes COVID-19. Institutional protocols and algorithms that pertain to the evaluation of patients at risk for COVID-19 are in a state of rapid change based on information released by regulatory bodies including the CDC and federal and state organizations. These policies and algorithms were followed during the patient's care in the ED.  Final Clinical Impression(s) / ED Diagnoses Final diagnoses:  Cardiac arrest [facility])    Rx / DC Orders ED Discharge Orders    None       Sherwood Gambler, MD 20-Dec-2019 1102

## 2019-12-27 DEATH — deceased
